# Patient Record
Sex: Female | Born: 1960 | Race: Black or African American | Hispanic: No | Marital: Single | Smoking: Former smoker
Health system: Southern US, Community
[De-identification: ages and names within clinical notes are randomized; demographics above are authoritative.]

## PROBLEM LIST (undated history)

## (undated) VITALS — BP 129/66 | HR 58 | Temp 98.0°F | Resp 16 | Ht 62.0 in | Wt 158.0 lb

## (undated) DIAGNOSIS — E119 Type 2 diabetes mellitus without complications: Secondary | ICD-10-CM

## (undated) DIAGNOSIS — F419 Anxiety disorder, unspecified: Secondary | ICD-10-CM

## (undated) DIAGNOSIS — E785 Hyperlipidemia, unspecified: Secondary | ICD-10-CM

## (undated) DIAGNOSIS — I10 Essential (primary) hypertension: Secondary | ICD-10-CM

## (undated) DIAGNOSIS — F329 Major depressive disorder, single episode, unspecified: Secondary | ICD-10-CM

## (undated) DIAGNOSIS — F32A Depression, unspecified: Secondary | ICD-10-CM

## (undated) DIAGNOSIS — K8020 Calculus of gallbladder without cholecystitis without obstruction: Secondary | ICD-10-CM

## (undated) DIAGNOSIS — F209 Schizophrenia, unspecified: Secondary | ICD-10-CM

## (undated) DIAGNOSIS — G629 Polyneuropathy, unspecified: Secondary | ICD-10-CM

## (undated) DIAGNOSIS — F319 Bipolar disorder, unspecified: Secondary | ICD-10-CM

## (undated) DIAGNOSIS — R569 Unspecified convulsions: Secondary | ICD-10-CM

## (undated) HISTORY — PX: TUBAL LIGATION: SHX77

## (undated) HISTORY — PX: TONSILLECTOMY AND ADENOIDECTOMY: SUR1326

---

## 1997-06-24 ENCOUNTER — Emergency Department (HOSPITAL_COMMUNITY): Admission: EM | Admit: 1997-06-24 | Discharge: 1997-06-24 | Payer: Self-pay | Admitting: Emergency Medicine

## 1997-07-09 ENCOUNTER — Inpatient Hospital Stay (HOSPITAL_COMMUNITY): Admission: AD | Admit: 1997-07-09 | Discharge: 1997-07-09 | Payer: Self-pay

## 1997-07-11 ENCOUNTER — Ambulatory Visit (HOSPITAL_COMMUNITY): Admission: RE | Admit: 1997-07-11 | Discharge: 1997-07-11 | Payer: Self-pay | Admitting: *Deleted

## 1997-07-12 ENCOUNTER — Ambulatory Visit (HOSPITAL_COMMUNITY): Admission: RE | Admit: 1997-07-12 | Discharge: 1997-07-12 | Payer: Self-pay | Admitting: Obstetrics & Gynecology

## 1997-07-19 ENCOUNTER — Ambulatory Visit (HOSPITAL_COMMUNITY): Admission: AD | Admit: 1997-07-19 | Discharge: 1997-07-19 | Payer: Self-pay | Admitting: *Deleted

## 1997-10-26 ENCOUNTER — Ambulatory Visit (HOSPITAL_COMMUNITY): Admission: RE | Admit: 1997-10-26 | Discharge: 1997-10-26 | Payer: Self-pay | Admitting: Obstetrics

## 1997-12-23 ENCOUNTER — Ambulatory Visit (HOSPITAL_COMMUNITY): Admission: RE | Admit: 1997-12-23 | Discharge: 1997-12-23 | Payer: Self-pay | Admitting: Obstetrics

## 1998-04-10 ENCOUNTER — Ambulatory Visit (HOSPITAL_COMMUNITY): Admission: AD | Admit: 1998-04-10 | Discharge: 1998-04-10 | Payer: Self-pay | Admitting: Obstetrics

## 1998-05-18 ENCOUNTER — Inpatient Hospital Stay (HOSPITAL_COMMUNITY): Admission: AD | Admit: 1998-05-18 | Discharge: 1998-05-21 | Payer: Self-pay | Admitting: Obstetrics

## 1998-05-27 ENCOUNTER — Emergency Department (HOSPITAL_COMMUNITY): Admission: EM | Admit: 1998-05-27 | Discharge: 1998-05-27 | Payer: Self-pay | Admitting: Emergency Medicine

## 1998-10-06 ENCOUNTER — Encounter: Admission: RE | Admit: 1998-10-06 | Discharge: 1998-10-06 | Payer: Self-pay | Admitting: Internal Medicine

## 1998-10-25 ENCOUNTER — Encounter: Admission: RE | Admit: 1998-10-25 | Discharge: 1999-01-23 | Payer: Self-pay | Admitting: *Deleted

## 1998-12-03 ENCOUNTER — Inpatient Hospital Stay (HOSPITAL_COMMUNITY): Admission: AD | Admit: 1998-12-03 | Discharge: 1998-12-06 | Payer: Self-pay | Admitting: Obstetrics

## 1999-03-07 ENCOUNTER — Other Ambulatory Visit: Admission: RE | Admit: 1999-03-07 | Discharge: 1999-03-07 | Payer: Self-pay | Admitting: Obstetrics

## 1999-07-07 ENCOUNTER — Emergency Department (HOSPITAL_COMMUNITY): Admission: EM | Admit: 1999-07-07 | Discharge: 1999-07-07 | Payer: Self-pay | Admitting: Emergency Medicine

## 1999-08-14 ENCOUNTER — Other Ambulatory Visit: Admission: RE | Admit: 1999-08-14 | Discharge: 1999-08-14 | Payer: Self-pay | Admitting: Obstetrics

## 1999-09-18 ENCOUNTER — Inpatient Hospital Stay (HOSPITAL_COMMUNITY): Admission: AD | Admit: 1999-09-18 | Discharge: 1999-09-20 | Payer: Self-pay | Admitting: Obstetrics

## 1999-09-25 ENCOUNTER — Encounter: Admission: RE | Admit: 1999-09-25 | Discharge: 1999-12-24 | Payer: Self-pay | Admitting: Obstetrics

## 2000-07-27 ENCOUNTER — Emergency Department (HOSPITAL_COMMUNITY): Admission: EM | Admit: 2000-07-27 | Discharge: 2000-07-27 | Payer: Self-pay | Admitting: Emergency Medicine

## 2001-06-12 ENCOUNTER — Encounter: Admission: RE | Admit: 2001-06-12 | Discharge: 2001-09-10 | Payer: Self-pay | Admitting: Internal Medicine

## 2001-06-21 ENCOUNTER — Inpatient Hospital Stay (HOSPITAL_COMMUNITY): Admission: AD | Admit: 2001-06-21 | Discharge: 2001-06-24 | Payer: Self-pay | Admitting: *Deleted

## 2001-06-21 ENCOUNTER — Emergency Department (HOSPITAL_COMMUNITY): Admission: EM | Admit: 2001-06-21 | Discharge: 2001-06-21 | Payer: Self-pay

## 2001-09-04 ENCOUNTER — Encounter (HOSPITAL_COMMUNITY): Admission: RE | Admit: 2001-09-04 | Discharge: 2001-09-22 | Payer: Self-pay | Admitting: Obstetrics

## 2001-09-15 ENCOUNTER — Encounter: Payer: Self-pay | Admitting: Obstetrics

## 2001-09-22 ENCOUNTER — Encounter: Payer: Self-pay | Admitting: Obstetrics

## 2001-09-23 ENCOUNTER — Encounter (INDEPENDENT_AMBULATORY_CARE_PROVIDER_SITE_OTHER): Payer: Self-pay | Admitting: Specialist

## 2001-09-23 ENCOUNTER — Inpatient Hospital Stay (HOSPITAL_COMMUNITY): Admission: AD | Admit: 2001-09-23 | Discharge: 2001-09-26 | Payer: Self-pay | Admitting: Obstetrics

## 2001-09-27 ENCOUNTER — Inpatient Hospital Stay (HOSPITAL_COMMUNITY): Admission: AD | Admit: 2001-09-27 | Discharge: 2001-10-02 | Payer: Self-pay | Admitting: Obstetrics

## 2001-10-10 ENCOUNTER — Inpatient Hospital Stay (HOSPITAL_COMMUNITY): Admission: AD | Admit: 2001-10-10 | Discharge: 2001-10-10 | Payer: Self-pay | Admitting: Obstetrics

## 2001-10-11 ENCOUNTER — Inpatient Hospital Stay (HOSPITAL_COMMUNITY): Admission: AD | Admit: 2001-10-11 | Discharge: 2001-10-11 | Payer: Self-pay | Admitting: Obstetrics

## 2001-10-13 ENCOUNTER — Inpatient Hospital Stay (HOSPITAL_COMMUNITY): Admission: AD | Admit: 2001-10-13 | Discharge: 2001-10-13 | Payer: Self-pay | Admitting: *Deleted

## 2002-11-04 ENCOUNTER — Emergency Department (HOSPITAL_COMMUNITY): Admission: EM | Admit: 2002-11-04 | Discharge: 2002-11-05 | Payer: Self-pay | Admitting: Emergency Medicine

## 2002-11-05 ENCOUNTER — Encounter: Payer: Self-pay | Admitting: Emergency Medicine

## 2003-01-04 ENCOUNTER — Inpatient Hospital Stay (HOSPITAL_COMMUNITY): Admission: EM | Admit: 2003-01-04 | Discharge: 2003-01-10 | Payer: Self-pay | Admitting: Psychiatry

## 2004-02-27 ENCOUNTER — Observation Stay (HOSPITAL_COMMUNITY): Admission: EM | Admit: 2004-02-27 | Discharge: 2004-02-28 | Payer: Self-pay | Admitting: Emergency Medicine

## 2004-02-28 ENCOUNTER — Inpatient Hospital Stay (HOSPITAL_COMMUNITY): Admission: RE | Admit: 2004-02-28 | Discharge: 2004-03-05 | Payer: Self-pay | Admitting: Psychiatry

## 2004-02-28 ENCOUNTER — Ambulatory Visit: Payer: Self-pay | Admitting: Psychiatry

## 2005-07-19 ENCOUNTER — Emergency Department (HOSPITAL_COMMUNITY): Admission: EM | Admit: 2005-07-19 | Discharge: 2005-07-20 | Payer: Self-pay | Admitting: Emergency Medicine

## 2005-12-27 ENCOUNTER — Emergency Department (HOSPITAL_COMMUNITY): Admission: EM | Admit: 2005-12-27 | Discharge: 2005-12-27 | Payer: Self-pay | Admitting: Emergency Medicine

## 2006-01-27 ENCOUNTER — Emergency Department (HOSPITAL_COMMUNITY): Admission: EM | Admit: 2006-01-27 | Discharge: 2006-01-27 | Payer: Self-pay | Admitting: Emergency Medicine

## 2006-04-24 ENCOUNTER — Ambulatory Visit: Payer: Self-pay | Admitting: Internal Medicine

## 2006-04-25 ENCOUNTER — Ambulatory Visit: Payer: Self-pay | Admitting: Internal Medicine

## 2006-04-25 ENCOUNTER — Ambulatory Visit: Payer: Self-pay | Admitting: *Deleted

## 2006-05-08 ENCOUNTER — Ambulatory Visit: Payer: Self-pay | Admitting: Internal Medicine

## 2006-12-22 ENCOUNTER — Telehealth (INDEPENDENT_AMBULATORY_CARE_PROVIDER_SITE_OTHER): Payer: Self-pay | Admitting: Internal Medicine

## 2007-01-01 ENCOUNTER — Ambulatory Visit: Payer: Self-pay | Admitting: Internal Medicine

## 2007-01-01 DIAGNOSIS — F410 Panic disorder [episodic paroxysmal anxiety] without agoraphobia: Secondary | ICD-10-CM | POA: Insufficient documentation

## 2007-01-01 DIAGNOSIS — E7800 Pure hypercholesterolemia, unspecified: Secondary | ICD-10-CM | POA: Insufficient documentation

## 2007-01-01 DIAGNOSIS — F429 Obsessive-compulsive disorder, unspecified: Secondary | ICD-10-CM | POA: Insufficient documentation

## 2007-01-01 LAB — CONVERTED CEMR LAB
Blood Glucose, Fingerstick: 179
Hgb A1c MFr Bld: 11.1 %

## 2007-01-13 ENCOUNTER — Ambulatory Visit: Payer: Self-pay | Admitting: Internal Medicine

## 2007-01-22 ENCOUNTER — Emergency Department (HOSPITAL_COMMUNITY): Admission: EM | Admit: 2007-01-22 | Discharge: 2007-01-22 | Payer: Self-pay | Admitting: Emergency Medicine

## 2007-02-26 ENCOUNTER — Ambulatory Visit: Payer: Self-pay | Admitting: Internal Medicine

## 2007-02-27 ENCOUNTER — Encounter (INDEPENDENT_AMBULATORY_CARE_PROVIDER_SITE_OTHER): Payer: Self-pay | Admitting: Internal Medicine

## 2007-03-01 ENCOUNTER — Encounter (INDEPENDENT_AMBULATORY_CARE_PROVIDER_SITE_OTHER): Payer: Self-pay | Admitting: Internal Medicine

## 2007-03-01 LAB — CONVERTED CEMR LAB
Cholesterol: 132 mg/dL (ref 0–200)
HDL: 48 mg/dL (ref >39)
LDL Cholesterol: 73 mg/dL (ref 0–99)
Total CHOL/HDL Ratio: 2.8
Triglycerides: 53 mg/dL (ref <150)
VLDL: 11 mg/dL (ref 0–40)

## 2007-05-21 ENCOUNTER — Encounter (INDEPENDENT_AMBULATORY_CARE_PROVIDER_SITE_OTHER): Payer: Self-pay | Admitting: Internal Medicine

## 2007-07-09 ENCOUNTER — Ambulatory Visit: Payer: Self-pay | Admitting: Internal Medicine

## 2007-07-09 DIAGNOSIS — A5901 Trichomonal vulvovaginitis: Secondary | ICD-10-CM | POA: Insufficient documentation

## 2007-07-09 LAB — CONVERTED CEMR LAB
Blood in Urine, dipstick: NEGATIVE
Glucose, Urine, Semiquant: NEGATIVE
Hgb A1c MFr Bld: 7.1 %
KOH Prep: NEGATIVE
Ketones, urine, test strip: NEGATIVE
Microalb, Ur: 0.2 mg/dL (ref 0.00–1.89)
Whiff Test: POSITIVE

## 2007-07-10 ENCOUNTER — Encounter (INDEPENDENT_AMBULATORY_CARE_PROVIDER_SITE_OTHER): Payer: Self-pay | Admitting: Nurse Practitioner

## 2007-07-30 ENCOUNTER — Emergency Department (HOSPITAL_COMMUNITY): Admission: EM | Admit: 2007-07-30 | Discharge: 2007-07-30 | Payer: Self-pay | Admitting: Emergency Medicine

## 2007-11-10 ENCOUNTER — Telehealth (INDEPENDENT_AMBULATORY_CARE_PROVIDER_SITE_OTHER): Payer: Self-pay | Admitting: Internal Medicine

## 2007-11-13 ENCOUNTER — Ambulatory Visit: Payer: Self-pay | Admitting: Internal Medicine

## 2007-11-13 DIAGNOSIS — J019 Acute sinusitis, unspecified: Secondary | ICD-10-CM | POA: Insufficient documentation

## 2007-11-13 LAB — CONVERTED CEMR LAB: Blood Glucose, Fingerstick: 100

## 2008-08-19 ENCOUNTER — Telehealth (INDEPENDENT_AMBULATORY_CARE_PROVIDER_SITE_OTHER): Payer: Self-pay | Admitting: Internal Medicine

## 2008-09-27 ENCOUNTER — Emergency Department (HOSPITAL_COMMUNITY): Admission: EM | Admit: 2008-09-27 | Discharge: 2008-09-27 | Payer: Self-pay | Admitting: Emergency Medicine

## 2008-10-12 ENCOUNTER — Ambulatory Visit: Payer: Self-pay | Admitting: Internal Medicine

## 2008-10-12 DIAGNOSIS — I10 Essential (primary) hypertension: Secondary | ICD-10-CM | POA: Insufficient documentation

## 2008-10-12 LAB — CONVERTED CEMR LAB
Blood Glucose, Fingerstick: 81
CO2: 24 meq/L (ref 19–32)
Calcium: 9.3 mg/dL (ref 8.4–10.5)
Creatinine, Ser: 0.85 mg/dL (ref 0.40–1.20)
Nitrite: NEGATIVE
Specific Gravity, Urine: 1.01
Urobilinogen, UA: 0.2
WBC Urine, dipstick: NEGATIVE
pH: 6

## 2008-10-25 ENCOUNTER — Telehealth (INDEPENDENT_AMBULATORY_CARE_PROVIDER_SITE_OTHER): Payer: Self-pay | Admitting: *Deleted

## 2008-10-28 ENCOUNTER — Ambulatory Visit: Payer: Self-pay | Admitting: Internal Medicine

## 2008-12-02 ENCOUNTER — Ambulatory Visit: Payer: Self-pay | Admitting: Internal Medicine

## 2008-12-06 ENCOUNTER — Emergency Department (HOSPITAL_COMMUNITY): Admission: EM | Admit: 2008-12-06 | Discharge: 2008-12-06 | Payer: Self-pay | Admitting: Family Medicine

## 2008-12-21 ENCOUNTER — Emergency Department (HOSPITAL_COMMUNITY): Admission: EM | Admit: 2008-12-21 | Discharge: 2008-12-21 | Payer: Self-pay | Admitting: Emergency Medicine

## 2008-12-22 ENCOUNTER — Inpatient Hospital Stay (HOSPITAL_COMMUNITY): Admission: AD | Admit: 2008-12-22 | Discharge: 2008-12-26 | Payer: Self-pay | Admitting: Psychiatry

## 2008-12-22 ENCOUNTER — Ambulatory Visit: Payer: Self-pay | Admitting: Psychiatry

## 2008-12-29 ENCOUNTER — Ambulatory Visit: Payer: Self-pay | Admitting: Internal Medicine

## 2008-12-29 DIAGNOSIS — F200 Paranoid schizophrenia: Secondary | ICD-10-CM | POA: Insufficient documentation

## 2008-12-29 LAB — CONVERTED CEMR LAB: Blood Glucose, Fingerstick: 82

## 2009-01-23 DIAGNOSIS — D649 Anemia, unspecified: Secondary | ICD-10-CM | POA: Insufficient documentation

## 2009-01-23 LAB — CONVERTED CEMR LAB
AST: 11 units/L (ref 0–37)
Alkaline Phosphatase: 72 units/L (ref 39–117)
BUN: 10 mg/dL (ref 6–23)
Basophils Absolute: 0 10*3/uL (ref 0.0–0.1)
Creatinine, Ser: 0.81 mg/dL (ref 0.40–1.20)
Eosinophils Absolute: 0.2 10*3/uL (ref 0.0–0.7)
Eosinophils Relative: 2 % (ref 0–5)
HCT: 37 % (ref 36.0–46.0)
HDL: 49 mg/dL (ref >39)
LDL Cholesterol: 63 mg/dL (ref 0–99)
Lymphocytes Relative: 24 % (ref 12–46)
Platelets: 311 10*3/uL (ref 150–400)
Potassium: 4.5 meq/L (ref 3.5–5.3)
RDW: 14.5 % (ref 11.5–15.5)
Total Bilirubin: 0.7 mg/dL (ref 0.3–1.2)
Total CHOL/HDL Ratio: 2.4
VLDL: 8 mg/dL (ref 0–40)

## 2009-03-02 ENCOUNTER — Ambulatory Visit: Payer: Self-pay | Admitting: Internal Medicine

## 2009-03-02 LAB — CONVERTED CEMR LAB
Platelets: 253 10*3/uL (ref 150–400)
Retic Ct Pct: 1.1 % (ref 0.4–3.1)
WBC: 6.9 10*3/uL (ref 4.0–10.5)

## 2009-03-16 ENCOUNTER — Encounter (INDEPENDENT_AMBULATORY_CARE_PROVIDER_SITE_OTHER): Payer: Self-pay | Admitting: Internal Medicine

## 2009-07-03 ENCOUNTER — Emergency Department (HOSPITAL_COMMUNITY): Admission: EM | Admit: 2009-07-03 | Discharge: 2009-07-04 | Payer: Self-pay | Admitting: Emergency Medicine

## 2009-07-04 ENCOUNTER — Ambulatory Visit: Payer: Self-pay | Admitting: Psychiatry

## 2009-07-04 ENCOUNTER — Inpatient Hospital Stay (HOSPITAL_COMMUNITY): Admission: AD | Admit: 2009-07-04 | Discharge: 2009-07-06 | Payer: Self-pay | Admitting: Psychiatry

## 2009-07-20 ENCOUNTER — Ambulatory Visit: Payer: Self-pay | Admitting: Internal Medicine

## 2009-08-21 ENCOUNTER — Ambulatory Visit: Payer: Self-pay | Admitting: Psychiatry

## 2009-08-21 ENCOUNTER — Inpatient Hospital Stay (HOSPITAL_COMMUNITY): Admission: AD | Admit: 2009-08-21 | Discharge: 2009-08-24 | Payer: Self-pay | Admitting: Psychiatry

## 2009-08-21 ENCOUNTER — Emergency Department (HOSPITAL_COMMUNITY): Admission: EM | Admit: 2009-08-21 | Discharge: 2009-08-21 | Payer: Self-pay | Admitting: Emergency Medicine

## 2010-03-13 NOTE — Letter (Signed)
Summary: *HSN Results Follow up  [place], [place] [address]   Phone: [phone]  Fax: [phone]      03/16/2009   Lorraine Turner [address] [place],   [address]   Dear  [name],                            ____S.Drinkard,FNP   ____D. Gore,FNP       ____B. McPherson,MD   ____V. Rankins,MD    __X__E. Shawntrice Salle,MD    ____N. Hassell Done, FNP  ____D. Jobe Igo, MD    ____K. Tomma Lightning, MD    ____Other     This letter is to inform you that your recent test(s):  _______Pap Smear    ____X___Lab Test     _______X-ray    ____X___ is within acceptable limits  _______ requires a medication change  _______ requires a follow-up lab visit  _______ requires a follow-up visit with your provider   Comments:  I'm not sure if you changed your diet or what is different, but you are no longer anemic.  Your B12 and folate levels were fine as well.       _________________________________________________________ If you have any questions, please contact our office                     Sincerely,  Mack Hook MD [facility]

## 2010-03-13 NOTE — Assessment & Plan Note (Signed)
Summary: renew meds////kt   Vital Signs:  Patient profile:   50 year old female Weight:      169 pounds BMI:     30.05 Temp:     96.3 degrees F Pulse rate:   64 / minute Pulse rhythm:   regular Resp:     20 per minute BP sitting:   129 / 79  (left arm) Cuff size:   regular  Vitals Entered By: Shellia Carwin CMA (July 20, 2009 12:35 PM) CC: Needs med refills.  Out of all meds x 2 days. Is Patient Diabetic? Yes Pain Assessment Patient in pain? no      CBG Result 90  Does patient need assistance? Ambulation Normal   CC:  Needs med refills.  Out of all meds x 2 days.Marland Kitchen  History of Present Illness: 1.  Hx of mild anemia:  pt. states she changed her diet--not clear exactly what changes were made, but when finally followed up, her hgb was quite good at beginning of year.  2.  DM:  Sugars running 90-165 , the latter after eating.  Has had a couple of readings into 70s.  Checking feet before bed.  Has not had an eye check in some time.  3.  Psych:  Hospitalized for 2-3 days.  Pt. was homicidal toward her husband, who she describes as continuing to cheat on her.  States she is financially dependent on her husband, but does not want to be with him any longer and this is causing her significant difficulties.  Pt. then admits to not staying on her meds.  Starts doing well and then stops the meds and at some point, just nose dives with her psych health.  Currently taking Trazodone and Risperdal.  States has been taking both since hospital discharge.  4.  HYpertension:  has been controlled.  5.  Hypercholesterolemia:  controlled  with last check  Allergies (verified): No Known Drug Allergies  Past History:  Past Surgical History: None  Physical Exam  General:  Calm, NAd Lungs:  Normal respiratory effort, chest expands symmetrically. Lungs are clear to auscultation, no crackles or wheezes. Heart:  Normal rate and regular rhythm. S1 and S2 normal without gallop, murmur, click, rub  or other extra sounds.  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 to stay on meds. Send to Birdie Hopes to emphasize this and to help her make a decision regarding her marital situation Orders: Psychology Referral (Psychology)  Problem # 2:  UNSPECIFIED ANEMIA (ICD-285.9) Resolved with dietary changes  Problem # 3:  HYPERTENSION (ICD-401.9) Controlled Her updated medication list for this problem includes:    Lisinopril 5 Mg Tabs (Lisinopril) .Marland Kitchen... 1 tab by mouth daily  Problem # 4:  HYPERCHOLESTEROLEMIA (ICD-272.0) Controlled Her updated medication list for this problem includes:    Lipitor 20 Mg Tabs (Atorvastatin calcium) .Marland Kitchen... 1 tab by mouth daily **needs cholesterol checked before more refills**  Problem # 5:  DIABETES MELLITUS, TYPE II, UNCONTROLLED (ICD-250.02) Controlled at 6.9% today. Her updated medication list for this problem includes:    Glucovance 5-500 Mg Tabs (Glyburide-metformin) .Marland Kitchen... Take 2 tablets by mouth every 12 hours for diabetes. must see dr.Jaleen Finch    Actos 30 Mg Tabs (Pioglitazone hcl) .Marland Kitchen... 1 tab by mouth daily    Lisinopril 5 Mg Tabs (Lisinopril) .Marland Kitchen... 1 tab by mouth daily  Complete Medication List: 1)  Glucovance 5-500 Mg Tabs (Glyburide-metformin) .Marland KitchenMarland KitchenMarland Kitchen  Take 2 tablets by mouth every 12 hours for diabetes. must see dr.Airabella Barley 2)  Invega 6 Mg Tb24 (Paliperidone) .... 2 tabs by mouth daily 3)  Actos 30 Mg Tabs (Pioglitazone hcl) .Marland Kitchen.. 1 tab by mouth daily 4)  Lipitor 20 Mg Tabs (Atorvastatin calcium) .Marland Kitchen.. 1 tab by mouth daily **needs cholesterol checked before more refills** 5)  Zithromax Z-pak 250 Mg Tabs (Azithromycin) .... 2 tabs by mouth today, then 1 tab by mouth daily for 4 more days. 6)  Glucometer Elite Test Strp (Glucose blood) .... Once daily sugar testing 7)  Lisinopril 5 Mg Tabs (Lisinopril) .Marland Kitchen.. 1 tab by mouth daily 8)  Trazodone Hcl 100 Mg Tabs  (Trazodone hcl) .... Unknown dosing--[facility]-dr. taylor 9)  Risperdal 1 Mg Tabs (Risperidone) .... Unknown dosing--[facility]--dr. Lovena Le.  Other Orders: Capillary Blood Glucose/CBG [id number])  Patient Instructions: 1)  Schedule [place] 2)  Follow up with Dr. Amil Amen in 4 months for CPP 3)  Referral to Birdie Hopes Prescriptions: LISINOPRIL 5 MG TABS (LISINOPRIL) 1 tab by mouth daily  #30 x 11   Entered and Authorized by:   Mack Hook MD   Signed by:   Mack Hook MD on 07/20/2009   Method used:   Faxed to ...       [facility] (retail)       [place], [place]  [address]       Ph: [phone]       Fax: [phone]   RxID:   [id number] LIPITOR 20 MG  TABS (ATORVASTATIN CALCIUM) 1 tab by mouth daily **needs cholesterol checked before more refills**  #30 x 11   Entered and Authorized by:   Mack Hook MD   Signed by:   Mack Hook MD on 07/20/2009   Method used:   Faxed to ...       [facility] (retail)       [place], [place]  [address]       Ph: [phone] [place]       Fax: [phone]   RxID:   [id number] ACTOS 30 MG  TABS (PIOGLITAZONE HCL) 1 tab by mouth daily  #30 x 11   Entered and Authorized by:   Mack Hook MD   Signed by:   Mack Hook MD on 07/20/2009   Method used:   Faxed to ...       [facility] (retail)       [place], [place]  [address]       Ph: [phone] [place]       Fax: [phone]   RxID:   [id number] GLUCOVANCE 5-500 MG  TABS (GLYBURIDE-METFORMIN) Take 2 tablets by mouth every 12 hours For diabetes. MUst SEE Dr.Naythen Heikkila  #120 x 11   Entered and Authorized by:   Mack Hook MD   Signed by:   Mack Hook MD on 07/20/2009   Method used:   Faxed to ...       [facility]  (retail)       [place],   [address]       Ph: [phone] [place]       Fax: [phone]   RxID:   [id number]

## 2010-04-29 LAB — GLUCOSE, CAPILLARY
Glucose-Capillary: 122 mg/dL — ABNORMAL HIGH (ref 70–99)
Glucose-Capillary: 126 mg/dL — ABNORMAL HIGH (ref 70–99)
Glucose-Capillary: 132 mg/dL — ABNORMAL HIGH (ref 70–99)
Glucose-Capillary: 134 mg/dL — ABNORMAL HIGH (ref 70–99)
Glucose-Capillary: 151 mg/dL — ABNORMAL HIGH (ref 70–99)
Glucose-Capillary: 163 mg/dL — ABNORMAL HIGH (ref 70–99)
Glucose-Capillary: 172 mg/dL — ABNORMAL HIGH (ref 70–99)
Glucose-Capillary: 56 mg/dL — ABNORMAL LOW (ref 70–99)

## 2010-04-29 LAB — URINALYSIS, ROUTINE W REFLEX MICROSCOPIC
Bilirubin Urine: NEGATIVE
Glucose, UA: NEGATIVE mg/dL
Ketones, ur: NEGATIVE mg/dL
Nitrite: NEGATIVE
Protein, ur: NEGATIVE mg/dL
pH: 6 (ref 5.0–8.0)

## 2010-04-29 LAB — BASIC METABOLIC PANEL
BUN: 12 mg/dL (ref 6–23)
Calcium: 9.6 mg/dL (ref 8.4–10.5)
Creatinine, Ser: 1.27 mg/dL — ABNORMAL HIGH (ref 0.4–1.2)
GFR calc non Af Amer: 45 mL/min — ABNORMAL LOW (ref >60)
Glucose, Bld: 254 mg/dL — ABNORMAL HIGH (ref 70–99)
Potassium: 3.5 mEq/L (ref 3.5–5.1)

## 2010-04-29 LAB — RAPID URINE DRUG SCREEN, HOSP PERFORMED
Benzodiazepines: NOT DETECTED
Cocaine: NOT DETECTED
Tetrahydrocannabinol: NOT DETECTED

## 2010-04-29 LAB — CBC
HCT: 37.5 % (ref 36.0–46.0)
MCHC: 34.3 g/dL (ref 30.0–36.0)
Platelets: 273 10*3/uL (ref 150–400)
RDW: 13.8 % (ref 11.5–15.5)
WBC: 7.7 10*3/uL (ref 4.0–10.5)

## 2010-04-29 LAB — DIFFERENTIAL
Basophils Absolute: 0 10*3/uL (ref 0.0–0.1)
Eosinophils Absolute: 0 10*3/uL (ref 0.0–0.7)
Eosinophils Relative: 0 % (ref 0–5)
Monocytes Absolute: 0.6 10*3/uL (ref 0.1–1.0)

## 2010-04-29 LAB — CK: Total CK: 127 U/L (ref 7–177)

## 2010-04-29 LAB — ETHANOL: Alcohol, Ethyl (B): <5 mg/dL (ref 0–10)

## 2010-04-29 LAB — POCT PREGNANCY, URINE: Preg Test, Ur: NEGATIVE

## 2010-04-30 LAB — GLUCOSE, CAPILLARY
Glucose-Capillary: 111 mg/dL — ABNORMAL HIGH (ref 70–99)
Glucose-Capillary: 119 mg/dL — ABNORMAL HIGH (ref 70–99)
Glucose-Capillary: 130 mg/dL — ABNORMAL HIGH (ref 70–99)
Glucose-Capillary: 141 mg/dL — ABNORMAL HIGH (ref 70–99)

## 2010-04-30 LAB — BASIC METABOLIC PANEL
Calcium: 9.2 mg/dL (ref 8.4–10.5)
Creatinine, Ser: 1 mg/dL (ref 0.4–1.2)
GFR calc Af Amer: >60 mL/min (ref >60)
GFR calc non Af Amer: 59 mL/min — ABNORMAL LOW (ref >60)
Sodium: 137 mEq/L (ref 135–145)

## 2010-04-30 LAB — DIFFERENTIAL
Basophils Relative: 0 % (ref 0–1)
Lymphocytes Relative: 18 % (ref 12–46)
Lymphs Abs: 1.5 10*3/uL (ref 0.7–4.0)
Monocytes Relative: 9 % (ref 3–12)
Neutro Abs: 5.7 10*3/uL (ref 1.7–7.7)
Neutrophils Relative %: 72 % (ref 43–77)

## 2010-04-30 LAB — URINALYSIS, ROUTINE W REFLEX MICROSCOPIC
Glucose, UA: NEGATIVE mg/dL
Leukocytes, UA: NEGATIVE
Protein, ur: 30 mg/dL — AB
Specific Gravity, Urine: 1.024 (ref 1.005–1.030)

## 2010-04-30 LAB — RAPID URINE DRUG SCREEN, HOSP PERFORMED
Benzodiazepines: NOT DETECTED
Cocaine: NOT DETECTED
Tetrahydrocannabinol: NOT DETECTED

## 2010-04-30 LAB — CBC
Hemoglobin: 12.2 g/dL (ref 12.0–15.0)
MCHC: 33.7 g/dL (ref 30.0–36.0)
RBC: 3.88 MIL/uL (ref 3.87–5.11)
WBC: 8 10*3/uL (ref 4.0–10.5)

## 2010-04-30 LAB — ETHANOL: Alcohol, Ethyl (B): <5 mg/dL (ref 0–10)

## 2010-04-30 LAB — URINE MICROSCOPIC-ADD ON

## 2010-05-16 LAB — CBC
Hemoglobin: 13.2 g/dL (ref 12.0–15.0)
MCHC: 33.9 g/dL (ref 30.0–36.0)
MCV: 93 fL (ref 78.0–100.0)
RBC: 4.18 MIL/uL (ref 3.87–5.11)
WBC: 11.8 10*3/uL — ABNORMAL HIGH (ref 4.0–10.5)

## 2010-05-16 LAB — HEPATIC FUNCTION PANEL
ALT: 15 U/L (ref 0–35)
AST: 16 U/L (ref 0–37)
Albumin: 3.9 g/dL (ref 3.5–5.2)
Alkaline Phosphatase: 74 U/L (ref 39–117)
Bilirubin, Direct: 0.1 mg/dL (ref 0.0–0.3)
Total Bilirubin: 0.6 mg/dL (ref 0.3–1.2)

## 2010-05-16 LAB — BASIC METABOLIC PANEL
CO2: 26 mEq/L (ref 19–32)
Calcium: 9.9 mg/dL (ref 8.4–10.5)
Chloride: 105 mEq/L (ref 96–112)
GFR calc Af Amer: >60 mL/min (ref >60)
Sodium: 140 mEq/L (ref 135–145)

## 2010-05-16 LAB — GLUCOSE, CAPILLARY
Glucose-Capillary: 115 mg/dL — ABNORMAL HIGH (ref 70–99)
Glucose-Capillary: 194 mg/dL — ABNORMAL HIGH (ref 70–99)
Glucose-Capillary: 194 mg/dL — ABNORMAL HIGH (ref 70–99)
Glucose-Capillary: 93 mg/dL (ref 70–99)

## 2010-05-16 LAB — RAPID URINE DRUG SCREEN, HOSP PERFORMED
Cocaine: NOT DETECTED
Opiates: NOT DETECTED

## 2010-05-16 LAB — DIFFERENTIAL
Basophils Relative: 0 % (ref 0–1)
Eosinophils Absolute: 0 10*3/uL (ref 0.0–0.7)
Eosinophils Relative: 0 % (ref 0–5)
Lymphocytes Relative: 12 % (ref 12–46)
Neutro Abs: 9.9 10*3/uL — ABNORMAL HIGH (ref 1.7–7.7)

## 2010-05-27 ENCOUNTER — Emergency Department (HOSPITAL_COMMUNITY)
Admission: EM | Admit: 2010-05-27 | Discharge: 2010-05-27 | Disposition: A | Discharge status: Home / Self Care | Payer: Self-pay | Attending: Emergency Medicine | Admitting: Emergency Medicine

## 2010-05-27 DIAGNOSIS — F411 Generalized anxiety disorder: Secondary | ICD-10-CM | POA: Insufficient documentation

## 2010-05-27 DIAGNOSIS — E119 Type 2 diabetes mellitus without complications: Secondary | ICD-10-CM | POA: Insufficient documentation

## 2010-05-27 LAB — RAPID URINE DRUG SCREEN, HOSP PERFORMED
Cocaine: NOT DETECTED
Tetrahydrocannabinol: NOT DETECTED

## 2010-05-27 LAB — DIFFERENTIAL
Eosinophils Relative: 2 % (ref 0–5)
Lymphocytes Relative: 30 % (ref 12–46)
Lymphs Abs: 1.3 10*3/uL (ref 0.7–4.0)
Monocytes Absolute: 0.6 10*3/uL (ref 0.1–1.0)
Monocytes Relative: 14 % — ABNORMAL HIGH (ref 3–12)

## 2010-05-27 LAB — BASIC METABOLIC PANEL
CO2: 24 mEq/L (ref 19–32)
Calcium: 9.2 mg/dL (ref 8.4–10.5)
Glucose, Bld: 154 mg/dL — ABNORMAL HIGH (ref 70–99)
Sodium: 138 mEq/L (ref 135–145)

## 2010-05-27 LAB — CBC
HCT: 36.6 % (ref 36.0–46.0)
MCHC: 33.3 g/dL (ref 30.0–36.0)
MCV: 88.8 fL (ref 78.0–100.0)
RDW: 13.8 % (ref 11.5–15.5)
WBC: 4.4 10*3/uL (ref 4.0–10.5)

## 2010-06-29 NOTE — Op Note (Signed)
   NAME:  RYLA, STOVALL NO.:  [id number]   MEDICAL RECORD NO.:  [id number]                   PATIENT TYPE:  INP   LOCATION:  9143                                 FACILITY:  [facility]   PHYSICIAN:  Frederico Hamman, M.D.           DATE OF BIRTH:  Oct 22, 1960   DATE OF PROCEDURE:  09/25/2001  DATE OF DISCHARGE:                                 OPERATIVE REPORT   PREOPERATIVE DIAGNOSES:  1. Multiparity procedure.  2. Postpartum tubal ligation.   DESCRIPTION OF PROCEDURE:  Using epidural, the patient in the supine  position, abdomen prepped and draped.  The bladder emptied with straight  catheter.  A midline subumbilical incision 1 inch long was made and carried  down to the fascia.  The fascia was cleaned, grasped with two Kochers, and  the fascia and the peritoneum were opened with the Mayo scissors.  It was  noted there was about 50-75 cc of old dark blood in the peritoneal cavity  which was aspirated.  The left tube was grasped in the mid portion with a  Babcock clamp and the tube traced to the fimbriae.  Sutures of 0 plain were  placed in the mesosalpinx below the portion of tube that had been clamped.  This was tied and approximately one inch of tube transected.  Hemostasis  satisfactory.  The procedure done in exact fashion on the other side.  The  abdomen closed in layers.  The fascia and peritoneal contours with 0 Dexon  and the skin closed with subcuticular suture of 0 plain.  The patient  tolerated the procedure well, taken to the recovery room in good condition.                                                Frederico Hamman, M.D.    BAM/MEDQ  D:  09/25/2001  T:  09/25/2001  Job:  [id number]

## 2010-06-29 NOTE — H&P (Signed)
[facility]  Patient:    Lorraine Turner, Lorraine Turner Visit Number: [id number] MRN: [id number]          Service Type: PSY Location: [facility] Attending Physician:  Stark Jock Dictated by:   Kerrie Buffalo Scott, N.P. Admit Date:  06/21/2001                     Psychiatric Admission Assessment  DATE OF ADMISSION:  Jun 21, 2001.  IDENTIFYING INFORMATION:  This is a 50 year old African-American female who is an involuntary admission.  HISTORY OF THE PRESENT ILLNESS:  This patient who is 6 months pregnant with a 10-year history of mental illness stopped her medications when she became pregnant according to the commitment; however, she states today that she stopped her medicines because she thought she did not need them at the time. She states she continued to take her Zoloft every other day or so, but stopped her Risperdal months ago.  She states she became angry with her husband and reported voices telling her that her husband would leave her.  She thinks that someone named Golda Acre is telling her things.  This according to the involuntary petition.  Today, she reports that the sheriff told her she was wandering too close to the road yesterday and he was afraid that she would harm herself. She said that she was out walking because she did not know where to go, she just wanted to run away, that she had so many thoughts in her head she could not think straight.  She had attempted to clean rooms.  She feels compulsions to clean but she got her thoughts mixed and could not decide what she wanted to do, then became angry and agitated and began throwing clothes out of the house and threw a chair out of the house.  The patient endorses some suicidal ideation without a specific plan.  She denies any homicidal ideation.  She endorses auditory hallucinations although she is not having those immediately at this time.  She also endorses some feeling that she compulsively  needs to clean and cannot stand to have things on the floor and needs to have it picked up.  PAST PSYCHIATRIC HISTORY:  The patient has been followed at [facility] but admits that she has been noncompliant with appointments, says she has difficulty with transportation.  This is her fifth psychiatric admission, with prior admissions to [facility] [place] and [facility], with her last admission being [facility] in June of 2002, at which time she was placed on Zoloft and Risperdal and she thought she was doing well so therefore stopped her Risperdal.  This is her first admission to [facility].  The patient reports she has a history of prior suicide attempts, with thoughts in the past of attempting to jump off of bridges, and has attempted to cut herself in the past.  SOCIAL HISTORY:  The patient has been married for the past 6 years.  She has 4 children, aged 59, 16, 3 and 2.  She lives at home with her husband of 6 years. Her mother, who is chronically ill with kidney failure, and her 3 youngest children, a 50 year old son who is going to school, and a 38 year old and 50 year old at home.  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  Patients husband is employed and works at [employer].  FAMILY HISTORY:  The patient denies any family history of substance abuse or mental illness.  The patient also denies any past personal history of sexual or physical abuse.  ALCOHOL AND DRUG HISTORY:  The patient is a nonsmoker and denies any substance abuse.  PAST MEDICAL HISTORY:  The patient is followed by Dr. Gracy Racer, who is her OB/GYN, and also by Dr. Jeanann Lewandowsky, who is her diabetes doctor.  The patient is due for a followup up  Wednesday at Dr. Alton Revere office.  The patient is also attending classes at the [facility]. Medical problems are diabetes mellitus, diagnosed approximately 4 years ago, and the patient apparently has an early as noted by urinalysis and she reports a history of vaginal itching for approximately 1 week.  DRUG ALLERGIES:  None.  POSITIVE PHYSICAL FINDINGS:  The patient was medically cleared in the emergency room at [facility], at which time the fetal heart rate was 144 and the fetus was moving and doing well.  Vital signs on admission:  Temp 99.4, pulse 81, respirations 20, blood pressure 119/67.  The patients urine showed moderate leukocyte esterase.  Her hemoglobin is 11.4, hematocrit 32.5. Her WBC is 10.5.  Her CMET was within normal limits, with a creatinine of 0.6 and a BUN of 6.  Her glucose was 109.  Her fasting glucose this morning was 135.  MENTAL STATUS EXAMINATION:  This is a medium-build, fully alert female with adequate hygiene.  She is in no acute distress.  She has a constricted, anxious affect with episodes of tearfulness but is cooperative and polite for the exam.  Speech is normal in pace and tone.  Mood is anxious and depressed. Thought process is generally logical and coherent; however, the patient does have episodes of thought disorganization and displays some thought blocking and some slight response latency in her speech pattern.  She is positive for suicidal ideation without a specific plan at this time.  No evidence of homicidal ideation.  No evidence of auditory or visual hallucinations at this time.  Cognitively, she is intact and oriented x3.  ADMISSION DIAGNOSES: Axis I:    1. Major depression, recurrent, severe, with psychosis.            2. Obsessive-compulsive disorder. Axis II:   None. Axis III:  Yeast infection by history, pregnancy of [redacted] weeks gestation, and            urinary tract infection. Axis IV:   Moderate,  economic problems with no money to pay for medications            and difficulty with transportation to appointments. Axis V:    Current 28, past year 53.   INITIAL PLAN OF CARE:  Involuntarily admit the patient to evaluate to evaluate her agitation and possible self harm thoughts.  Our goal is to alleviate her suicidal ideation, agitation, and to improve her sleep and functioning at home with her activities of daily living, with thoughts of returning her to full functioning at home.  We are going to ask the case manager to assist with supportive care in terms of her children while she is hospitalized and we have spoken with her husband and are going to talk to his employer to make sure he feels free  to care for the children while she is here on the unit.  Meanwhile, we have also spoken to Dr. Ruthann Cancer who concurs on the patients needs for medications.  We are going to continue her Zoloft 50 mg daily and increase that to 75 mg tomorrow.  We will restart her Risperdal at 0.5 mg p.o. q.h.s. and also give her Ambien 10 mg at h.s. p.r.n. for sleep.  Meanwhile, we will also put her on Macrobid 2x a day for 7 days and Gynazole-1 cream x1.  These medications have been approved by Dr. Ruthann Cancer.  ESTIMATED LENGTH OF STAY:  2 to 3 days. Dictated by:   Kerrie Buffalo Scott, N.P. Attending Physician:  Stark Jock DD:  06/22/01 TD:  06/22/01 Job: [id number]

## 2010-06-29 NOTE — Op Note (Signed)
   NAME:  Lorraine Turner, MORRISON NO.:  [id number]   MEDICAL RECORD NO.:  [id number]                   PATIENT TYPE:  INP   LOCATION:  9143                                 FACILITY:  [facility]   PHYSICIAN:  Frederico Hamman, M.D.           DATE OF BIRTH:  1961-01-29   DATE OF PROCEDURE:  09/24/2001  DATE OF DISCHARGE:                                 OPERATIVE REPORT   PROCEDURE:  Vacuum extraction.   DESCRIPTION OF PROCEDURE:  The patient was fully dilated and pushed until  the vertex was at the +2 station.  The fetal heart rate was in the 80s, 80-  90, and oxygen was started.  The vacuum was applied at a +2 station and the  patient pushed at each contraction.  In between the contractions, the  pressure was released from the vacuum.  There was one pop-off during one  push.  She had a normal vaginal delivery from the LOA position at 1:33 p.m.  of a female, Apgars 4 and 8, cord pH 7.07.  There was no episiotomy or  laceration and the placenta was delivered spontaneously.                                               Frederico Hamman, M.D.    BAM/MEDQ  D:  09/25/2001  T:  09/25/2001  Job:  [id number]

## 2010-06-29 NOTE — Discharge Summary (Signed)
NAME:  Lorraine Turner, Lorraine Turner                         ACCOUNT NO.:  [id number]   MEDICAL RECORD NO.:  [id number]                   PATIENT TYPE:  INP   LOCATION:  9143                                 FACILITY:  [facility]   PHYSICIAN:  Charles A. Jodi Mourning, M.D.             DATE OF BIRTH:  07-29-1960   DATE OF ADMISSION:  09/23/2001  DATE OF DISCHARGE:  09/26/2001                                 DISCHARGE SUMMARY   ADMITTING DIAGNOSES:  1. Term pregnancy.  2. Active labor.  3. Previous cesarean section.  4. Desires permanent sterilization.   DISCHARGE DIAGNOSES:  Term pregnancy.  1. Active labor.  2. Previous cesarean section.  3. Desires permanent sterilization.  4. Status post normal spontaneous vaginal delivery viable female infant with     Apgars of 4 at one minute, 8 at five minutes.  Delivery was done on     September 24, 2001.  There were no complications.  5. Postpartum tubal ligation was done on postpartum day number one on September 25, 2001.  There were no intraoperative complications.  The patient had     an uncomplicated postpartum and postoperative course and was discharged     home in good condition with infant on postpartum day number two.   REASON FOR ADMISSION:  A 51 year old G6, P4, estimated date of confinement  of October 02, 2001 diabetic on Novolin 2 units in the a.m. and Lantus 25  units at night.  Had an amniocentesis on September 22, 2001 with positive PG.  Presents for induction of labor.  On examination cervix was 3 cm dilated,  90% effaced and the vertex was at a -3 station.   PAST SURGICAL HISTORY:  Cesarean section 2000, normal spontaneous vaginal  delivery 1981, 1994, 2001.   PAST MEDICAL HISTORY:  Diabetes.   MEDICATIONS:  Prenatal vitamins, Novolog p.r.n., Lantus.   ALLERGIES:  No known drug allergies.   PHYSICAL EXAMINATION:  GENERAL:  Well-nourished, well-developed female in no  acute distress.  VITAL SIGNS:  Temperature 97, pulse 62, respiratory rate 18,  blood pressure  110/60.  HEENT:  Normal.  LUNGS:  Clear.  CARDIOVASCULAR:  Regular rate and rhythm without murmurs, rubs, or gallops.  ABDOMEN:  Gravid, nontender.  PELVIC:  Cervix 3 cm dilated, vertex at a -1 station.   IMPRESSION:  A [redacted] week gestation, type 1 diabetic.  Amniocentesis done with  positive fetal lung maturity studies.   PLAN:  Admitted for induction of labor.   LABORATORY VALUES:  Hemoglobin 11.9, hematocrit 34.8, white blood cell count  7900, platelets 222,000.  RPR was nonreactive.   HOSPITAL COURSE:  The patient was admitted to the labor and delivery unit  and on examination the cervix was 3 cm dilated, 90% effaced, and the vertex  was at a -3 station.  Active rupture of membranes was done and fluid was  clear.  The patient was  having uterine contractions that were regular and  strong.  She requested epidural for pain and that was given.  Labor  progressed rapidly to full dilatation by midday and she progressed to vacuum  assisted vaginal delivery because of fetal bradycardia and ineffective  pushing efforts.  The patient had an uncomplicated vacuum assisted delivery  and the postpartum course was also uncomplicated.  She had initially  requested postpartum tubal ligation and postpartum tubal ligation was done  on postpartum day number one without complications.  The remainder of her  postpartum and postoperative course was uncomplicated and she was discharged  home on postpartum day number two in good condition.   DISDCHARGE LABORATORY VALUES:  Hemoglobin 11.3, hematocrit 32.9.   DISCHARGE DISPOSITION:  Continue medications.  Continue prenatal vitamins.  Tylox one to two tablets q.3-4h. as needed for pain.  Continue diabetes  medications and patient is to contact her primary care physician for further  management.  Routine written instructions were given for diet and activity  and routine postpartum and postoperative care.  The patient is to call Dr.   Marcheta Grammes office for a follow-up appointment.                                               Charles A. Jodi Mourning, M.D.    CAH/MEDQ  D:  10/30/2001  T:  11/02/2001  Job:  [id number]

## 2010-06-29 NOTE — Discharge Summary (Signed)
NAME:  Lorraine Turner, Lorraine Turner NO.:  [id number]   MEDICAL RECORD NO.:  [id number]          PATIENT TYPE:  IPS   LOCATION:  0301                          FACILITY:  [facility]   PHYSICIAN:  Rulon Eisenmenger, M.D. DATE OF BIRTH:  August 13, 1960   DATE OF ADMISSION:  02/28/2004  DATE OF DISCHARGE:  03/05/2004                                 DISCHARGE SUMMARY   IDENTIFYING DATA:  This is a  50 year old African-American female, married,  voluntarily admitted, presenting in the emergency room, taking an overdose  of 8 Ativan tablets, wanting to hurt herself.  Out of medications for 4  weeks, drinking 40 ounce beers, 6-8 on the weekend, episodes of rage,  homicidal ideation towards husband over conflict about daughter and positive  paranoid ideation.  Past psychiatric history:  Second hospitalization  [facility], last in November of 2004, history of mood swings,  paranoid ideation and suicidal and homicidal ideation   ADMISSION MEDICATIONS:  Zoloft and Ativan which she had not been taking for  over a month.   ALLERGIES:  No known drug allergies.   PHYSICAL EXAMINATION:  Within normal limits, neurologically nonfocal.   ROUTINE ADMISSION LABS:  Essentially within normal limits.   MENTAL STATUS EXAM:  Fully alert, cooperative but labile, hyper verbal.  Mood irritable, agitated at times, tearful and pressured, with positive  flight of ideas, paranoid ideation, suicidal and homicidal ideation,  contracting in the hospital but appearing somewhat impulsive.  Cognitively  intact, quite distractible but was able to be redirected.  Cognition was  grossly intact.  Judgment and insight impaired.   ADMISSION DIAGNOSES:   AXIS I:  1.  Bipolar disorder, mixed state, with psychotic features.  2.  Alcohol dependence versus abuse.  3.  Rule out substance-induced mood disorder superimposed on bipolar      disorder.   AXIS II:  None/deferred.   AXIS III:  Diabetes mellitus type 2  and liver contusion.   AXIS IV:  Moderate to severe, parenting stress and limited support system,  conflict with interpersonal relationships.   AXIS V:  30/60-65.   HOSPITAL COURSE:  The patient was admitted and ordered routine p.r.n.  medications, underwent further monitoring, and was encouraged to participate  in individual, group and milieu therapy.  Monitored for safety in light of  homicidal and suicidal thoughts.  The patient was started on Tegretol for  mood stabilization and Zoloft continued.  The patient checked for STDs at  request.  The patient clearly had mood swings, described obsessive  behaviors, washing with bleach, using bleach for everything, having to have  3 bottles of bleach always as backup.  Clear OCD symptoms which worsen when  mood is unstable.  Clear mood lability with history of violence, believed  that she had multiple police charges and a record that would keep her from  getting work, however when requested to check this her last charge was many  years ago and she was quite happy about this.  Appeared to have some mood  disability including hypomania during her hospitalization but significant  increase in agitation, anxiety and  improved insight and judgment as well as  awareness of the effect of substances on her mood.  The patient was  discharged in improved condition, mood was euthymic, after a family meeting  and aftercare planning.  Family and patient were comfortable with discharge  plan.  She again was discharged with euthymic mood, affect brighter,  improved judgment and insight, coping skills, no dangerous ideation, and a  good aftercare plan, including plan to be abstinent and seek all substance  abuse treatment resources as well as be compliant with her medications,  accepting her diagnosis and need for medications and therapy.  The patient  was discharged after medication education on:  1.  Protonix 40 mg q.a.m.  2.  Zoloft 50 mg q.a.m.  3.   Nystatin cream Triamcinolone applied to corners of mouth for 2 weeks and      twice a day.  4.  Risperdal 2 mg at 9 p.m.  5.  Depakote ER 250 mg q.a.m. and 2 q.9 p.m.  6.  Trazodone 100 mg q.9 p.m.   DISPOSITION:  The patient was to follow up with Dr. Norville Haggard for diabetes and  asymptomatic pyuria, appointment 11:45 on January 24.  Had been on Septra  for 7 days, a 7 day course, and was to follow up with [facility] with Dr. Laverta Baltimore January 26 at 1:30.   DISCHARGE DIAGNOSES:   AXIS I:  1.  Bipolar disorder, mixed state, with psychotic features.  2.  Alcohol dependence versus abuse.  3.  Rule out substance-induced mood disorder superimposed on bipolar      disorder.   AXIS II:  None/deferred.   AXIS III:  Diabetes mellitus type 2 and liver contusion.   AXIS IV:  Moderate to severe, parenting stress and limited support system,  conflict with interpersonal relationships.   AXIS V:  Global assessment of function on discharge was 55.      JEM/MEDQ  D:  04/05/2004  T:  04/05/2004  Job:  [id number]

## 2010-06-29 NOTE — Discharge Summary (Signed)
   NAME:  Lorraine Turner, Lorraine Turner NO.:  [id number]   MEDICAL RECORD NO.:  [id number]                   PATIENT TYPE:  INP   LOCATION:  9323                                 FACILITY:  [facility]   PHYSICIAN:  Frederico Hamman, M.D.           DATE OF BIRTH:  Mar 09, 1960   DATE OF ADMISSION:  09/27/2001  DATE OF DISCHARGE:  10/02/2001                                 DISCHARGE SUMMARY   HISTORY OF PRESENT ILLNESS:  The patient is a 50 year old gravida 6, para 5  with a normal delivery on September 24, 2001 and tubal ligation on September 25, 2001.  She went home on September 26, 2001 with no complaints.  She was  readmitted on September 27, 2001 with severe back pain and a temperature of  104.  She had positive nitrites, moderate leukocytes, and white cell 15.5  and bilateral CVA tenderness.  The patient had blood cultures done.  Was  placed on Rocephin.  Her sodium was 140, potassium 3.7, chloride 99.  Blood  culture was positive.  Hemoglobin 12.9, white count 15.5 on admission.  Her  cultures grew out group A Streptococcus pyogenes.  Culture grew out E. coli.  The patient rapidly defervesced after receiving Rocephin from the 17th to  the 22nd.  She had been afebrile for greater than three days and was  discharged on October 02, 2001.  Tylenol No.3 for pain.  Ceftin 500 p.o.  b.i.d. for 10 days.  She was asymptomatic.   DISCHARGE DIAGNOSES:  Status post pyelonephritis post delivery and post  tubal ligation.                                               Frederico Hamman, M.D.    BAM/MEDQ  D:  10/02/2001  T:  10/02/2001  Job:  [id number]

## 2010-06-29 NOTE — Discharge Summary (Signed)
NAME:  Lorraine Turner, Lorraine Turner NO.:  [id number]   MEDICAL RECORD NO.:  [id number]                   PATIENT TYPE:  IPS   LOCATION:  0407                                 [facility]ACILITY[facility]  [facility]   PHYSICIAN:  Carlton Adam, M.D.                   DATE OF BIRTH:  1960/12/08   DATE OF ADMISSION:  01/04/2003  DATE OF DISCHARGE:  01/10/2003                                 DISCHARGE SUMMARY   CHIEF COMPLAINT AND PRESENT ILLNESS:  This was the second admission to [facility] for this 50 year old African-American female,  married, voluntarily admitted.  She presented accompanied by the DSS worker  due to two to four weeks of agitation, scrubbing floors, angry outbursts,  and name calling, verbally abusive toward the children.  She was positive  for auditory hallucinations telling her to rob a bank and run away.  She  called the children names.  Voices were hypercritical.  She was sleeping  well.  She endorsed irritability, agitation, mood swings.  She kept bottles  of Clorox and scrubbed constantly.   PAST PSYCHIATRIC HISTORY:  This was the second time at [facility].  She had been on Zoloft and Risperdal.  She stopped medications  secondary to not being able to afford it.   SUBSTANCE ABUSE HISTORY:  She denied the use or abuse of any substances.   PAST MEDICAL HISTORY:  Diabetes mellitus 2, diet controlled.   MEDICATIONS:  None.   PHYSICAL EXAMINATION:  Physical examination was performed, failed to show  any acute findings.   MENTAL STATUS EXAM:  Mental status exam revealed an alert, pleasant,  cooperative female with full affect, normal motor.  Speech was normal rate,  rhythm, and production, no pressure.  Mood: Anxious, depressed.  Affect:  Anxious.  Thought processes: Positive for suicidal ideation without a plan,  auditory hallucinations with vague commands.  Cognitive: Cognition was well  preserved.   ADMISSION DIAGNOSES:   AXIS I:  1. Rule out bipolar, mixed with psychotic features.  2. Rule out impulse disorder, not otherwise specified.   AXIS II:  No diagnosis.   AXIS III:  Diabetes mellitus type 2.   AXIS IV:  Moderate.   AXIS V:  Global assessment of functioning upon admission 25-30, highest  global assessment of functioning in the last year 17.   LABORATORY DATA:  CBC was within normal limits.  Blood chemistries: Glucose  117.  Liver profile was within normal limits.   HOSPITAL COURSE:  She was admitted and started in intensive individual and  group psychotherapy.  She was given Ambien for sleep and she was given  Risperdal 0.5 mg M-Tabs and 1 mg Ativan on a p.r.n. basis.  She was started  on Zoloft 25 mg per day.  She was started on Trileptal 150 mg twice a day  and at bedtime and able eventually to  increase to 150 mg twice a day and 300  mg at bedtime.  She was maintained on the Risperdal 0.25 mg twice a day and  0.5 mg at night.  She tolerated the medications quite well.  She did endorse  that she was unable to afford her medications and that is why she  discontinued them.  She did endorse increased symptoms, auditory  hallucinations.  Once back on medications, she started feeling better,  voices started decreasing.  She had some bladder pain.  Testing was positive  for urinary tract infection and she was treated.  She tolerated the  medications well.  There were no further mood swings, denied any further  hallucinations.  Overall, she was feeling much better.  On November 29, she  was in full contact with reality, no suicidal ideas, no homicidal ideas, no  delusions, no hallucinations, willing and motivated to pursue further  outpatient treatment, so we went ahead and discharged to outpatient  followup.   DISCHARGE DIAGNOSES:   AXIS I:  1. Bipolar disorder, mixed with psychosis.  2. Impulse control disorder, not otherwise specified.   AXIS II:  No diagnosis.   AXIS III:  Diabetes  mellitus type 2.   AXIS IV:  Moderate.   AXIS V:  Global assessment of functioning upon discharge 50.   DISCHARGE MEDICATIONS:  1. Risperdal 0.5 mg one half twice a day and one at night.  2. Zoloft 50 mg per day.  3. Septra DS one every 12 hours.  4. Trileptal 150 mg twice a day and two at night.  5. Ativan 1 mg twice a day as needed for anxiety.  6. Seroquel 25 mg one to two at bedtime as needed for sleep.   FOLLOW UP:  She was to follow up with [facility], Dr.  Marvel Plan.                                               Carlton Adam, M.D.    IL/MEDQ  D:  01/27/2003  T:  01/28/2003  Job:  [id number]

## 2010-06-29 NOTE — H&P (Signed)
NAME:  Lorraine Turner, Lorraine Turner NO.:  [id number]   MEDICAL RECORD NO.:  [id number]          PATIENT TYPE:  EMS   LOCATION:  ED                           [facility]ACILITY:  [facility]   PHYSICIAN:  Aquilla Hacker, M.D. DATE OF BIRTH:  February 17, 1960   DATE OF ADMISSION:  02/27/2004  DATE OF DISCHARGE:                                HISTORY & PHYSICAL   CHIEF COMPLAINT:  I took some pills.   HISTORY OF PRESENT ILLNESS:  Lorraine Turner is a 50 year old female with an  extensive history of psychiatric problems and diabetes mellitus who states  that she took some Lorazepam tablets, approximately 7-9 earlier today.  She  cannot recall the time during which she took the tablets.  She states that  she was trying to hurt herself but did not want to die.  The reason she gave  was that things around Lorraine were not getting any better.  She said Lorraine  Turner is currently homeless and Lorraine husband refused to let Lorraine stay with  them.  She currently denies having any pain or difficulties breathing.  She  went on to say that she and Lorraine husband argues often and that she has  attempted to kill herself in the past following a situation in which Lorraine  husband cleaned their house better than she did and over frustrations that  he was able to clean the house better than she did, she tried to harm  herself.   PAST MEDICAL HISTORY:  1.  Diabetes mellitus.  2.  Major depression with psychosis.  According to previous notes in the      computer, the patient has been followed at [facility]      but has history of noncompliance with Lorraine appointments secondary to      transportation.  She has had multiple psychiatric admissions with [facility] [place].  She      has been tried on multiple medications which include Zoloft and      Risperdal, and again, she has a history of harming herself.  3.  Past suicide attempts.  4.  OCD.   PAST  SURGICAL HISTORY:  None.   ALLERGIES:  None.   HOSPITAL MEDICATIONS:  1.  Lorazepam.  2.  Zoloft.  The patient does not know the dosages of either of these medications.   FAMILY HISTORY:  Mother has hypertension and end stage renal disease.  Father has Alzheimer's disease.   SOCIAL HISTORY:  Cigarettes denied; alcohol denied; cocaine, the patient  states never.   REVIEW OF SYSTEMS:  No chest pain or shortness of breath.  All other systems  are as HPI, otherwise, negative.   PHYSICAL EXAMINATION:  GENERAL APPEARANCE:  The patient is very sleepy and  initially very difficult to arouse.  However, after calling Lorraine name  multiple times and nudging Lorraine shoulder, she begins to arouse and answers  questions that are presented to Lorraine.  VITAL SIGNS:  Heart rate 79, blood pressure 113/77, SPO2 100% on room air,  temperature  100.4, respirations 24.  HEENT:  Normocephalic.  Extraocular muscles intact.  Pupils equal, round and  reactive to light.  NECK:  Supple, no lymphadenopathy.  Thyroid not palpable.  CARDIAC:  S1, S2 present.  Regular rate and rhythm.  No S3, S4.  RESPIRATORY:  Decreased breath sounds bilaterally.  ABDOMEN:  Soft, nontender, nondistended, positive bowel sounds.  No  organomegaly.  EXTREMITIES:  No leg edema.  NEUROLOGICAL:  The patient is alert and oriented x3.  However, the patient  is very sleepy.  Cranial nerves 2-12 are intact.  MUSCULOSKELETAL:  Upper and lower extremity strength 5/5.   LABORATORY DATA:  Urine drug screen is negative.  UA nitrites positive,  leukocytes negative, WBC 0-2.   ASSESSMENT/PLAN:  1.  Attempted suicide.  Will admit the patient to the step down unit for      closer observation.  Will provide aggressive IV fluid hydration for now.      The patient received one dose of Narcan in the emergency room.  Will      monitor Lorraine airway breathing and circulation closely, and if either of      these become compromised, will address as needed.   Will also consider      additional dosage of Narcan if needed.  Once the patient is medically      deemed clear, will attempt to transfer the patient over the [facility] so that she can have more intense evaluation and treatment by      psychiatry.  2.  Diabetes mellitus.  Will perform Accu-Checks a.c. and q.h.s. for now.      Will initiate sliding scale insulin if the patient shows that she needs      this regimen implemented.  3.  GI prophylaxis.  Will provide Protonix 40 mg daily.  4.  DVT prophylaxis.  Will provide heparin 5000 units subcu 12 hours.      OR/MEDQ  D:  02/27/2004  T:  02/27/2004  Job:  [id number]

## 2010-06-29 NOTE — Discharge Summary (Signed)
[place]  Patient:    Lorraine Turner, Lorraine Turner Visit Number: [id number] MRN: [id number]          Service Type: PSY Location: [place] Attending Physician:  Stark Jock Dictated by:   Woodroe Chen Sabra Heck, M.D. Admit Date:  06/21/2001 Discharge Date: 06/24/2001                             Discharge Summary  CHIEF COMPLAINT AND PRESENT ILLNESS:  This was the first admission to [facility] for this 50 year old female, six months pregnant, with a history of mental illness.  Stopped her medications when she became pregnant.  She stopped the medications as she was afraid she did not need them anymore.  Continued to take Zoloft every other day.  Stopped her Risperdal. Became angry with her husband.  Reported voices.  Fearing her husband will leave her.  Someone named ________ was telling her things.  She was found wandering to close to the road.  She claimed that she just wanted to walk, had so many thoughts in her head that she wanted to think straight.  She went home, started throwing clothes out of the house and threw a chair out of the house.  Thoughts of suicidal ideas without a specific plan.  Endorses auditory hallucinations.  PAST PSYCHIATRIC HISTORY:  [facility].  Noncompliant with medication.  Has been inpatient to [facility] in [place], [place].  Thoughts in the past of attempting to jump off bridges.  ALCOHOL/DRUG HISTORY:  Denies the use or abuse of any substances.  PHYSICAL EXAMINATION:  Performed and failed to show any acute findings.  MENTAL STATUS EXAMINATION:  Medium-built, fully alert female, adequate hygiene.  No acute distress.  Constricted, anxious affect, episodes of tearfulness.  Cooperative and polite.  Speech normal in pace.  Mood is anxious and depressed.  Thought process generally logical and coherent.  Episode of thought disorganization.  Displayed  some thought-blocking and some slight response latency in her speech pattern.  Positive with suicidal ideation without a plan.  Cognition well-preserved.  ADMISSION DIAGNOSES: Axis I:    1. Major depression, recurrent with psychotic features.            2. Obsessive-compulsive disorder. Axis II:   No diagnosis. Axis III:  1. Yeast infection by history.            2. Pregnancy, 25 weeks. Axis IV:   Moderate. Axis V:    Global Assessment of Functioning upon admission 28; highest Global            Assessment of Functioning in the last year 38.  HOSPITAL COURSE:  She was admitted and started intensive individual and group psychotherapy.  She was placed back on her medication, basically her Risperdal.  Zoloft 50 mg per day.  Risperdal 0.5 mg at bedtime.  She gradually started responding to the medication.  Her mood improved.  Her affect became brighter.  There was a family session where she was encouraged and supported by the husband.  Issues of self-esteem, ruminating about her appearance, how she is not attractive.  These thoughts were challenged and she started working on coping skills.  When the family session with the husband went well, that was very reassuring.  On Jun 24, 2001, she was in full contact with reality, understood the need to stay on medication.  No active suicidal ideation.  No homicidal ideation.  Was going to be working closely with mental health.  As was everything was in place, we discharged to outpatient follow-up.  DISCHARGE DIAGNOSES: Axis I:    1. Major depression with psychotic features versus schizoaffective               disorder, depressed.            2. Obsessive-compulsive disorder. Axis II:   No diagnosis. Axis III:  Pregnancy. Axis IV:   Moderate. Axis V:    Global Assessment of Functioning upon discharge 55.  DISCHARGE MEDICATIONS: 1. Zoloft 50 mg daily. 2. Risperdal 0.5 mg at bedtime. 3. Ambien as needed for sleep. 4. Macrobid for a urinary  tract infection.  FOLLOW-UP:  [facility]. Dictated by:   Woodroe Chen Sabra Heck, M.D. Attending Physician:  Stark Jock DD:  07/22/01 TD:  07/25/01 Job: [id number]

## 2010-09-19 ENCOUNTER — Emergency Department (HOSPITAL_COMMUNITY)
Admission: EM | Admit: 2010-09-19 | Discharge: 2010-09-19 | Disposition: A | Discharge status: Home / Self Care | Payer: Medicaid Other | Attending: Emergency Medicine | Admitting: Emergency Medicine

## 2010-09-19 DIAGNOSIS — F319 Bipolar disorder, unspecified: Secondary | ICD-10-CM | POA: Insufficient documentation

## 2010-09-19 DIAGNOSIS — E7800 Pure hypercholesterolemia, unspecified: Secondary | ICD-10-CM | POA: Insufficient documentation

## 2010-09-19 DIAGNOSIS — E119 Type 2 diabetes mellitus without complications: Secondary | ICD-10-CM | POA: Insufficient documentation

## 2010-09-19 DIAGNOSIS — F411 Generalized anxiety disorder: Secondary | ICD-10-CM | POA: Insufficient documentation

## 2010-09-19 LAB — DIFFERENTIAL
Basophils Absolute: 0 10*3/uL (ref 0.0–0.1)
Eosinophils Absolute: 0 10*3/uL (ref 0.0–0.7)
Eosinophils Relative: 0 % (ref 0–5)
Lymphocytes Relative: 18 % (ref 12–46)

## 2010-09-19 LAB — CBC
Platelets: 267 10*3/uL (ref 150–400)
RDW: 12.7 % (ref 11.5–15.5)
WBC: 7.5 10*3/uL (ref 4.0–10.5)

## 2010-09-19 LAB — BASIC METABOLIC PANEL
Chloride: 100 mEq/L (ref 96–112)
GFR calc Af Amer: >60 mL/min (ref >60)
Potassium: 3.7 mEq/L (ref 3.5–5.1)
Sodium: 134 mEq/L — ABNORMAL LOW (ref 135–145)

## 2010-09-19 LAB — GLUCOSE, CAPILLARY
Glucose-Capillary: 429 mg/dL — ABNORMAL HIGH (ref 70–99)
Glucose-Capillary: 106 mg/dL — ABNORMAL HIGH (ref 70–99)

## 2010-11-19 LAB — URINALYSIS, ROUTINE W REFLEX MICROSCOPIC
Nitrite: NEGATIVE
Specific Gravity, Urine: 1.027
Urobilinogen, UA: 0.2

## 2010-11-19 LAB — URINE MICROSCOPIC-ADD ON

## 2010-11-19 LAB — COMPREHENSIVE METABOLIC PANEL
ALT: 14
AST: 13
Alkaline Phosphatase: 73
GFR calc Af Amer: >60
Glucose, Bld: 181 — ABNORMAL HIGH
Potassium: 3.4 — ABNORMAL LOW
Sodium: 138
Total Protein: 6.9

## 2010-11-19 LAB — DIFFERENTIAL
Basophils Relative: 1
Eosinophils Absolute: 0.2
Eosinophils Relative: 2
Lymphs Abs: 2.4
Monocytes Absolute: 0.6
Monocytes Relative: 8
Neutrophils Relative %: 59

## 2010-11-19 LAB — RAPID URINE DRUG SCREEN, HOSP PERFORMED
Amphetamines: NOT DETECTED
Cocaine: NOT DETECTED
Tetrahydrocannabinol: NOT DETECTED

## 2010-11-19 LAB — CBC
Hemoglobin: 12.2
RBC: 3.97
RDW: 13.3

## 2010-11-19 LAB — ETHANOL: Alcohol, Ethyl (B): <5

## 2011-01-15 ENCOUNTER — Emergency Department (HOSPITAL_COMMUNITY)
Admission: EM | Admit: 2011-01-15 | Discharge: 2011-01-15 | Disposition: A | Discharge status: Home / Self Care | Payer: Self-pay | Attending: Emergency Medicine | Admitting: Emergency Medicine

## 2011-01-15 ENCOUNTER — Encounter: Payer: Self-pay | Admitting: Emergency Medicine

## 2011-01-15 ENCOUNTER — Emergency Department (HOSPITAL_COMMUNITY): Payer: Self-pay

## 2011-01-15 DIAGNOSIS — R079 Chest pain, unspecified: Secondary | ICD-10-CM | POA: Insufficient documentation

## 2011-01-15 DIAGNOSIS — S20219A Contusion of unspecified front wall of thorax, initial encounter: Secondary | ICD-10-CM | POA: Insufficient documentation

## 2011-01-15 DIAGNOSIS — I10 Essential (primary) hypertension: Secondary | ICD-10-CM | POA: Insufficient documentation

## 2011-01-15 DIAGNOSIS — R0602 Shortness of breath: Secondary | ICD-10-CM | POA: Insufficient documentation

## 2011-01-15 DIAGNOSIS — E119 Type 2 diabetes mellitus without complications: Secondary | ICD-10-CM | POA: Insufficient documentation

## 2011-01-15 DIAGNOSIS — Z79899 Other long term (current) drug therapy: Secondary | ICD-10-CM | POA: Insufficient documentation

## 2011-01-15 DIAGNOSIS — N644 Mastodynia: Secondary | ICD-10-CM | POA: Insufficient documentation

## 2011-01-15 HISTORY — DX: Essential (primary) hypertension: I10

## 2011-01-15 MED ORDER — HYDROCODONE-ACETAMINOPHEN 5-325 MG PO TABS: 1.0000 | ORAL_TABLET | ORAL | Status: AC | PRN

## 2011-01-15 NOTE — ED Notes (Signed)
PT returned from xray
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
## 2011-01-15 NOTE — ED Notes (Signed)
Patient is resting comfortably. 
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
## 2011-01-15 NOTE — ED Notes (Signed)
Pt ambulated with a steady gait; VSS; no acute signs of distress. Pt reported she will follow d/c instructions.

## 2011-01-15 NOTE — ED Notes (Signed)
Patient transported to X-ray 

## 2011-08-12 ENCOUNTER — Encounter (HOSPITAL_COMMUNITY): Payer: Self-pay | Admitting: *Deleted

## 2011-08-12 ENCOUNTER — Emergency Department (HOSPITAL_COMMUNITY)
Admission: EM | Admit: 2011-08-12 | Discharge: 2011-08-14 | Disposition: A | Discharge status: Other Acute Inpatient Hospital | Payer: Self-pay | Attending: Emergency Medicine | Admitting: Emergency Medicine

## 2011-08-12 DIAGNOSIS — F200 Paranoid schizophrenia: Secondary | ICD-10-CM

## 2011-08-12 DIAGNOSIS — F209 Schizophrenia, unspecified: Secondary | ICD-10-CM | POA: Insufficient documentation

## 2011-08-12 DIAGNOSIS — F319 Bipolar disorder, unspecified: Secondary | ICD-10-CM | POA: Insufficient documentation

## 2011-08-12 DIAGNOSIS — R45851 Suicidal ideations: Secondary | ICD-10-CM | POA: Insufficient documentation

## 2011-08-12 DIAGNOSIS — I10 Essential (primary) hypertension: Secondary | ICD-10-CM | POA: Insufficient documentation

## 2011-08-12 DIAGNOSIS — E119 Type 2 diabetes mellitus without complications: Secondary | ICD-10-CM | POA: Insufficient documentation

## 2011-08-12 HISTORY — DX: Major depressive disorder, single episode, unspecified: F32.9

## 2011-08-12 HISTORY — DX: Bipolar disorder, unspecified: F31.9

## 2011-08-12 HISTORY — DX: Anxiety disorder, unspecified: F41.9

## 2011-08-12 HISTORY — DX: Depression, unspecified: F32.A

## 2011-08-12 HISTORY — DX: Schizophrenia, unspecified: F20.9

## 2011-08-12 LAB — ETHANOL: Alcohol, Ethyl (B): <11 mg/dL (ref 0–11)

## 2011-08-12 LAB — GLUCOSE, CAPILLARY: Glucose-Capillary: 145 mg/dL — ABNORMAL HIGH (ref 70–99)

## 2011-08-12 LAB — COMPREHENSIVE METABOLIC PANEL
Albumin: 4.1 g/dL (ref 3.5–5.2)
Alkaline Phosphatase: 65 U/L (ref 39–117)
BUN: 13 mg/dL (ref 6–23)
Potassium: 3.9 mEq/L (ref 3.5–5.1)
Sodium: 137 mEq/L (ref 135–145)
Total Protein: 7.8 g/dL (ref 6.0–8.3)

## 2011-08-12 LAB — CBC
HCT: 36.8 % (ref 36.0–46.0)
MCHC: 34.5 g/dL (ref 30.0–36.0)
RDW: 13.1 % (ref 11.5–15.5)

## 2011-08-12 LAB — RAPID URINE DRUG SCREEN, HOSP PERFORMED
Amphetamines: NOT DETECTED
Benzodiazepines: NOT DETECTED

## 2011-08-12 MED ORDER — METFORMIN HCL 500 MG PO TABS
1000.0000 mg | ORAL_TABLET | Freq: Two times a day (BID) | ORAL | Status: DC
  Administered 2011-08-13 – 2011-08-14 (×3): 1000 mg via ORAL
  Filled 2011-08-12 (×5): qty 2

## 2011-08-12 MED ORDER — ATORVASTATIN CALCIUM 10 MG PO TABS
10.0000 mg | ORAL_TABLET | Freq: Every day | ORAL | Status: DC
  Administered 2011-08-13 – 2011-08-14 (×2): 10 mg via ORAL
  Filled 2011-08-12 (×2): qty 1

## 2011-08-12 MED ORDER — LISINOPRIL 10 MG PO TABS
10.0000 mg | ORAL_TABLET | Freq: Every day | ORAL | Status: DC
  Administered 2011-08-13 – 2011-08-14 (×2): 10 mg via ORAL
  Filled 2011-08-12 (×2): qty 1

## 2011-08-12 MED ORDER — SERTRALINE HCL 50 MG PO TABS
100.0000 mg | ORAL_TABLET | Freq: Every day | ORAL | Status: DC
  Administered 2011-08-13 – 2011-08-14 (×2): 100 mg via ORAL
  Filled 2011-08-12 (×2): qty 2

## 2011-08-12 MED ORDER — LORAZEPAM 1 MG PO TABS: 1.0000 mg | ORAL_TABLET | Freq: Three times a day (TID) | ORAL | Status: DC | PRN

## 2011-08-12 MED ORDER — LINAGLIPTIN 5 MG PO TABS
5.0000 mg | ORAL_TABLET | Freq: Every day | ORAL | Status: DC
  Administered 2011-08-13 – 2011-08-14 (×2): 5 mg via ORAL
  Filled 2011-08-12 (×2): qty 1
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
## 2011-08-12 NOTE — ED Notes (Signed)
Patient and belongings wanded by security. Patient changed into blue scrubs and red socks.

## 2011-08-12 NOTE — ED Notes (Signed)
Patient has one silver necklace, one black and red necklace, one tan and black necklace, one cross necklace, one brown and peach and green beaded necklace, one shell necklace, one silver earring, one blue and silver bracelet, one silver stud nose ring, one white long sleeve shirt, one pair of gold flip flops, one brown tank top, one white bra, one pair camo pants, one brown belt, one pair underwear, one brown wallet, one black eyeliner, $1 dollar bill, $1.07 in change. All in white belonging bag. One green and blue bookbag. One taser that looks like a pack of marlboro cigarettes locked up with security.

## 2011-08-12 NOTE — ED Provider Notes (Signed)
History     CSN: [id number]  Arrival date & time 08/12/11  [id number]   First MD Initiated Contact with Patient 08/12/11 1901      Chief Complaint  Patient presents with  . Medical Clearance  . Suicidal    (Consider location/radiation/quality/duration/timing/severity/associated sxs/prior treatment) The history is provided by the patient.  pt w hx bipolar disorder, anxiety, c/o worsening depression and anxiety. States a lot of stress related to money and financial issues. States her children are homeless, and spouse gets upset with her when she gives them money. States at times feels almost hopeless. Denies any thoughts of harm to self or others. No overdose or attempt to harm self. Denies any prior attempt to harm self.  States her physical health has been at baseline. States her diabetes is well controlled, states is compliant w taking her medications.      Past Medical History  Diagnosis Date  . Hypertension   . Diabetes mellitus   . Bipolar 1 disorder   . Schizophrenia   . Anxiety   . Depression     No past surgical history on file.  No family history on file.  History  Substance Use Topics  . Smoking status: Never Smoker   . Smokeless tobacco: Not on file  . Alcohol Use: No    OB History    Grav Para Term Preterm Abortions TAB SAB Ect Mult Living                  Review of Systems  Constitutional: Negative for fever.  HENT: Negative for neck pain.   Eyes: Negative for redness.  Respiratory: Negative for shortness of breath.   Cardiovascular: Negative for chest pain.  Gastrointestinal: Negative for abdominal pain.  Genitourinary: Negative for flank pain.  Musculoskeletal: Negative for back pain.  Skin: Negative for rash.  Neurological: Negative for headaches.  Hematological: Does not bruise/bleed easily.  Psychiatric/Behavioral: Positive for dysphoric mood.    Allergies  Review of patient's allergies indicates no known allergies.  Home Medications    Current Outpatient Rx  Name Route Sig Dispense Refill  . ATORVASTATIN CALCIUM 10 MG PO TABS Oral Take 10 mg by mouth daily.      . GLYBURIDE-METFORMIN PO Oral Take 1 tablet by mouth 2 (two) times daily.    Marland Kitchen LISINOPRIL 10 MG PO TABS Oral Take 10 mg by mouth daily.      . SERTRALINE HCL 100 MG PO TABS Oral Take 100 mg by mouth daily.      Marland Kitchen JANUVIA PO Oral Take 1 tablet by mouth daily.      BP 130/73  Pulse 75  Temp 98.7 F (37.1 C) (Oral)  Resp 18  SpO2 100%  Physical Exam  Nursing note and vitals reviewed. Constitutional: She is oriented to person, place, and time. She appears well-developed and well-nourished. No distress.  HENT:  Head: Atraumatic.  Nose: Nose normal.  Mouth/Throat: Oropharynx is clear and moist.  Eyes: Conjunctivae are normal. Pupils are equal, round, and reactive to light. No scleral icterus.  Neck: Neck supple. No tracheal deviation present.  Cardiovascular: Normal rate.   Pulmonary/Chest: Effort normal. No respiratory distress.  Abdominal: Soft. Normal appearance. She exhibits no distension. There is no tenderness.  Musculoskeletal: She exhibits no edema.  Neurological: She is alert and oriented to person, place, and time.       Steady gait  Skin: Skin is warm and dry. No rash noted.  Psychiatric:  Tearful, depressed. Denies thoughts of harm to self or others.     ED Course  Procedures (including critical care time)   Labs Reviewed  CBC  COMPREHENSIVE METABOLIC PANEL  ETHANOL  URINE RAPID DRUG SCREEN (HOSP PERFORMED)      MDM  Labs. Ativan 1 mg po. Act team called. telepsych eval.  Reviewed nursing notes and prior charts for additional history.    Verified pts diabetes meds with her - pt states currently only taking metformin 1000 mg bid, and januvia 100 mg a day for her diabetes. States also states zoloft, bp med, and lipitor.   Recheck calmer, alert. Awaiting act eval.       Mirna Mires, MD 08/12/11 2213
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
## 2011-08-13 MED ORDER — DICYCLOMINE HCL 10 MG/ML IM SOLN
20.0000 mg | Freq: Once | INTRAMUSCULAR | Status: AC
  Administered 2011-08-13: 20 mg via INTRAMUSCULAR
  Filled 2011-08-13: qty 2

## 2011-08-13 MED ORDER — DIPHENHYDRAMINE HCL 25 MG PO CAPS
25.0000 mg | ORAL_CAPSULE | Freq: Four times a day (QID) | ORAL | Status: DC | PRN
  Administered 2011-08-13: 25 mg via ORAL
  Filled 2011-08-13: qty 1

## 2011-08-13 MED ORDER — POLYETHYLENE GLYCOL 3350 17 G PO PACK
17.0000 g | PACK | Freq: Every day | ORAL | Status: DC
  Administered 2011-08-13: 17 g via ORAL
  Filled 2011-08-13 (×2): qty 1

## 2011-08-13 NOTE — ED Notes (Signed)
Pt complains of itching on her back from a bug bite.  Benadryl ordered and given as directed.  Pt resting quietly in room.

## 2011-08-13 NOTE — ED Provider Notes (Addendum)
BP 131/66  Pulse 52  Temp 98.3 F (36.8 C) (Oral)  Resp 18  SpO2 99%  Patient seen and evaluated by me. No complaints at this time. Telepsych pending.   Blair Heys, MD 08/13/11 0750 BP 131/66  Pulse 62  Temp 98.3 F (36.8 C) (Oral)  Resp 18  SpO2 99%  Per nsg staff pt with small insect bite that is itching at this time. Benadryl prn ordered.  Blair Heys, MD 08/13/11 1031
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
## 2011-08-14 ENCOUNTER — Encounter (HOSPITAL_COMMUNITY): Payer: Self-pay

## 2011-08-14 ENCOUNTER — Inpatient Hospital Stay (HOSPITAL_COMMUNITY)
Admission: AD | Admit: 2011-08-14 | Discharge: 2011-08-16 | DRG: 885 | Disposition: A | Discharge status: Home / Self Care | Payer: No Typology Code available for payment source | Source: Ambulatory Visit | Attending: Psychiatry | Admitting: Psychiatry

## 2011-08-14 DIAGNOSIS — Z79899 Other long term (current) drug therapy: Secondary | ICD-10-CM

## 2011-08-14 DIAGNOSIS — I10 Essential (primary) hypertension: Secondary | ICD-10-CM | POA: Diagnosis present

## 2011-08-14 DIAGNOSIS — F251 Schizoaffective disorder, depressive type: Secondary | ICD-10-CM | POA: Diagnosis present

## 2011-08-14 DIAGNOSIS — Z9114 Patient's other noncompliance with medication regimen: Secondary | ICD-10-CM

## 2011-08-14 DIAGNOSIS — Z9119 Patient's noncompliance with other medical treatment and regimen: Secondary | ICD-10-CM

## 2011-08-14 DIAGNOSIS — Z91199 Patient's noncompliance with other medical treatment and regimen due to unspecified reason: Secondary | ICD-10-CM

## 2011-08-14 DIAGNOSIS — F32A Depression, unspecified: Secondary | ICD-10-CM | POA: Diagnosis present

## 2011-08-14 DIAGNOSIS — Z91148 Patient's other noncompliance with medication regimen for other reason: Secondary | ICD-10-CM

## 2011-08-14 DIAGNOSIS — E119 Type 2 diabetes mellitus without complications: Secondary | ICD-10-CM | POA: Diagnosis present

## 2011-08-14 DIAGNOSIS — F259 Schizoaffective disorder, unspecified: Principal | ICD-10-CM | POA: Diagnosis present

## 2011-08-14 MED ORDER — ALUM & MAG HYDROXIDE-SIMETH 200-200-20 MG/5ML PO SUSP: 30.0000 mL | ORAL | Status: DC | PRN

## 2011-08-14 MED ORDER — LINAGLIPTIN 5 MG PO TABS
5.0000 mg | ORAL_TABLET | Freq: Every day | ORAL | Status: DC
  Administered 2011-08-15 – 2011-08-16 (×2): 5 mg via ORAL
  Filled 2011-08-14 (×5): qty 1

## 2011-08-14 MED ORDER — SERTRALINE HCL 100 MG PO TABS
100.0000 mg | ORAL_TABLET | Freq: Every day | ORAL | Status: DC
  Administered 2011-08-15 – 2011-08-16 (×2): 100 mg via ORAL
  Filled 2011-08-14: qty 1
  Filled 2011-08-14: qty 14
  Filled 2011-08-14 (×3): qty 1

## 2011-08-14 MED ORDER — MAGNESIUM HYDROXIDE 400 MG/5ML PO SUSP: 30.0000 mL | Freq: Every day | ORAL | Status: DC | PRN

## 2011-08-14 MED ORDER — METFORMIN HCL 500 MG PO TABS
1000.0000 mg | ORAL_TABLET | Freq: Two times a day (BID) | ORAL | Status: DC
  Administered 2011-08-14 – 2011-08-16 (×4): 1000 mg via ORAL
  Filled 2011-08-14 (×9): qty 2

## 2011-08-14 MED ORDER — LISINOPRIL 20 MG PO TABS
20.0000 mg | ORAL_TABLET | Freq: Every day | ORAL | Status: DC
  Administered 2011-08-15 – 2011-08-16 (×2): 20 mg via ORAL
  Filled 2011-08-14 (×4): qty 1

## 2011-08-14 MED ORDER — GLYBURIDE 5 MG PO TABS
10.0000 mg | ORAL_TABLET | Freq: Two times a day (BID) | ORAL | Status: DC
  Administered 2011-08-14 – 2011-08-16 (×4): 10 mg via ORAL
  Filled 2011-08-14 (×9): qty 2

## 2011-08-14 MED ORDER — TRAZODONE HCL 50 MG PO TABS
50.0000 mg | ORAL_TABLET | Freq: Every evening | ORAL | Status: DC | PRN
  Administered 2011-08-14 – 2011-08-15 (×2): 50 mg via ORAL
  Filled 2011-08-14 (×2): qty 1
  Filled 2011-08-14: qty 28

## 2011-08-14 MED ORDER — ACETAMINOPHEN 325 MG PO TABS: 650.0000 mg | ORAL_TABLET | Freq: Four times a day (QID) | ORAL | Status: DC | PRN

## 2011-08-14 MED ORDER — SIMVASTATIN 10 MG PO TABS
10.0000 mg | ORAL_TABLET | Freq: Every day | ORAL | Status: DC
  Administered 2011-08-14 – 2011-08-15 (×2): 10 mg via ORAL
  Filled 2011-08-14 (×5): qty 1

## 2011-08-14 MED ORDER — GLYBURIDE-METFORMIN 5-500 MG PO TABS: 2.0000 | ORAL_TABLET | Freq: Two times a day (BID) | ORAL | Status: DC

## 2011-08-14 MED ORDER — WHITE PETROLATUM GEL
Status: AC
  Administered 2011-08-14: 13:00:00
  Filled 2011-08-14: qty 5

## 2011-08-14 NOTE — Progress Notes (Signed)
Pt is a new admit to the unit this evening.  She met with PA and then went to group, so interaction was minimal.  She reports she has been feeling anxious concerning her home situation.  She has a hx of schizophrenia, and hears voices, but is not hearing any at the time of this assessment.  She denies SI/HI.  She has been non-compliant with her medications for about 4 mons.  Since coming on the unit pt has been appropriate/cooperative.  Pt is encouraged to make her needs known to staff.  Pt made request for a sleep aid.  Order obtained.  Pt voices no other needs/concerns.  Safety maintained with q15 minute checks.
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
## 2011-08-14 NOTE — ED Provider Notes (Signed)
Filed Vitals:   08/14/11 0615  BP: 133/80  Pulse: 63  Temp: 98.2 F (36.8 C)  Resp: 18    Pt without complaints this am.  Resting comfortably.  Awaiting placement.  Kathalene Frames, MD 08/14/11 [phone]

## 2011-08-14 NOTE — Progress Notes (Signed)
Psychoeducational Group Note  Date:  08/14/2011 Time:  2113  Group Topic/Focus:  Wrap-Up Group:   The focus of this group is to help patients review their daily goal of treatment and discuss progress on daily workbooks.  Participation Level:  Active  Participation Quality:  Appropriate  Affect:  Appropriate  Cognitive:  Appropriate  Insight:  Good  Engagement in Group:  Good  Additional Comments:  Pt interacted well.  Wynelle Fanny R 08/14/2011, 9:14 PM
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
## 2011-08-14 NOTE — ED Notes (Signed)
Pt has been prepared for discharge. She has been accepted to [facility] and is waiting on a 400 hall bed to open.
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
## 2011-08-14 NOTE — Tx Team (Signed)
Initial Interdisciplinary Treatment Plan  PATIENT STRENGTHS: (choose at least two) Capable of independent living General fund of knowledge Motivation for treatment/growth Supportive family/friends  PATIENT STRESSORS: Educational concerns Financial difficulties Marital or family conflict   PROBLEM LIST: Problem List/Patient Goals Date to be addressed Date deferred Reason deferred Estimated date of resolution  Anxiety       Depression       Schizophrenia                                            DISCHARGE CRITERIA:  Improved stabilization in mood, thinking, and/or behavior Need for constant or close observation no longer present Verbal commitment to aftercare and medication compliance  PRELIMINARY DISCHARGE PLAN: Attend aftercare/continuing care group Participate in family therapy  PATIENT/FAMIILY INVOLVEMENT: This treatment plan has been presented to and reviewed with the patient, Lorraine Turner, and/or family member, .  The patient and family have been given the opportunity to ask questions and make suggestions.  [name] 08/14/2011, 6:34 PM

## 2011-08-14 NOTE — H&P (Signed)
Psychiatric Admission Assessment Adult  Patient Identification:  Lorraine Turner Date of Evaluation:  08/14/2011 51 yo MAAF  CC: noncompliance with psych meds 4 mos now SI & depression  History of Present Illness: Stopped her meds 4 months ago as she felt better and hadn't gotten patient assistance yet.Today got into it with her husband because he doesn't want her to give money to her 45yo son who is homeless. Because she has been off her meds she escalated and began to have feelings that she would harm her husband and so she left the house. Has AH command in nature at times to harm self or others but does not report an antipsychotic in current meds. Can check with [facility] tomorrow regarding when last prescribed an antipsychotic.     Past Psychiatric History: Diagnosed age 50  Used to cut was picked on at school  [facility] July 11-14 2011 paranoid that husband was unfaithful   Substance Abuse History:  Social History:    reports that she has never smoked. She does not have any smokeless tobacco history on file. She reports that she does not drink alcohol or use illicit drugs. HS 1981 2 years of college. Married once husband is 61 they have 3 children 2 daughters 13&10 one son age 34 has 57 other children a son 66 and daughter 40 from prior relationships.  Family Psych History: Denies   Past Medical History:     Past Medical History  Diagnosis Date  . Hypertension   . Diabetes mellitus   . Bipolar 1 disorder   . Schizophrenia   . Anxiety   . Depression       History reviewed. No pertinent past surgical history.  Allergies: No Known Allergies  Current Medications:  Prior to Admission medications   Medication Sig Start Date End Date Taking? Authorizing Provider  atorvastatin (LIPITOR) 10 MG tablet Take 10 mg by mouth daily.      Historical Provider, MD  glyBURIDE-metformin (GLUCOVANCE) 5-500 MG per tablet Take 2 tablets by mouth 2 (two) times daily.    Historical Provider, MD    lisinopril (PRINIVIL,ZESTRIL) 20 MG tablet Take 20 mg by mouth daily.    Historical Provider, MD  pravastatin (PRAVACHOL) 20 MG tablet Take 20 mg by mouth daily.    Historical Provider, MD  sertraline (ZOLOFT) 100 MG tablet Take 100 mg by mouth daily.      Historical Provider, MD  sitaGLIPtin (JANUVIA) 100 MG tablet Take 100 mg by mouth daily.    Historical Provider, MD    Mental Status Examination/Evaluation: Objective:  Appearance: Casual  Psychomotor Activity:  Normal  Eye Contact::  Good  Speech:  Normal Rate  Volume:  Normal  Mood: has calmed down    Affect:  Full Range  Thought Process: clear rational goal oriented -get back on meds    Orientation:  Full  Thought Content:  Denies AVH but does feel watched/criticized at times   Suicidal Thoughts:  No  Homicidal Thoughts:  No  Judgement:  Intact  Insight:  Present    DIAGNOSIS:    AXIS I Schizoaffective Disorder  AXIS II Used to cut when younger   AXIS III See medical history. DM HTN   AXIS IV Husband doesn't want her to give money to 58 yo son -not his biological   AXIS V 51-60 moderate symptoms     Treatment Plan Summary: Admit for safety & stabilization  Restart her Zoloft Neurontin and Trazadone Return to [place] and  Dr.Taylor Agree with H&P from [facility]

## 2011-08-15 DIAGNOSIS — F259 Schizoaffective disorder, unspecified: Principal | ICD-10-CM

## 2011-08-15 LAB — GLUCOSE, CAPILLARY
Glucose-Capillary: 133 mg/dL — ABNORMAL HIGH (ref 70–99)
Glucose-Capillary: 83 mg/dL (ref 70–99)
Glucose-Capillary: 92 mg/dL (ref 70–99)

## 2011-08-15 LAB — T4, FREE: Free T4: 1.12 ng/dL (ref 0.80–1.80)

## 2011-08-15 LAB — T3, FREE: T3, Free: 2.3 pg/mL (ref 2.3–4.2)

## 2011-08-15 MED ORDER — DIPHENHYDRAMINE HCL 50 MG PO CAPS
50.0000 mg | ORAL_CAPSULE | ORAL | Status: DC | PRN
  Administered 2011-08-15: 50 mg via ORAL

## 2011-08-15 NOTE — Progress Notes (Signed)
Patient continues to improve with her treatment.  She denies any SI/HI/AVH.  She is feeling overwhelmed with her family situation.  Her son is living in his truck on her property.  The husband does not want the son in the house and the patient to give him any money.  She rates her depression as a 2; hopelessness as a 4.  She attended group therapy this am and participated.  She has been sitting in the day room this morning. She interacts well with staff; she has appropriate behavior.  She is eating and sleeping well.  Continue 15 min safety checks.  Monitor MD orders and medication management.  CBGs monitored before meals and hs.  Patient tolerating medications. Encourage patient to continue to attend group and socialize on unit.  She met with CW and will have a probably discharge in the next 1-2 days.  Lyndee Leo, RN.

## 2011-08-15 NOTE — Discharge Planning (Signed)
Lorraine Turner appears to be in a neutral mood.  States she is here due to "messing with my medications, and I went berserk."  Sees Dr Lovena Le at [facility].  Says she is on Neurontin, Zoloft and Trazodone.  Cites stressors of adult children who are recently homeless.  Volunteers at [employer].  Hopes she is not here long.  C/O racing thoughts.  I passed this info on to Dr Dell Ponto.

## 2011-08-15 NOTE — [facility] Suicide Risk Assessment (Signed)
Suicide Risk Assessment  Admission Assessment     Demographic factors:  Assessment Details Time of Assessment: Admission Information Obtained From: Patient Current Mental Status:  Current Mental Status: Suicidal ideation indicated by patient (endorses si last week) Loss Factors:  Loss Factors: Decrease in vocational status;Decline in physical health;Financial problems / change in socioeconomic status Historical Factors:  Historical Factors: Prior suicide attempts;Family history of mental illness or substance abuse Risk Reduction Factors:  Risk Reduction Factors: Living with another person, especially a relative;Positive social support;Positive therapeutic relationship  CLINICAL FACTORS:   Previous Psychiatric Diagnoses and Treatments Medical Diagnoses and Treatments/Surgeries Schizoaffective Disorder - Depressed Type.  COGNITIVE FEATURES THAT CONTRIBUTE TO RISK:  None Noted.  Diagnosis:  Axis I: Schizoaffective Disorder - Depressive Type.   The patient was seen today and reports the following:   ADL's: Intact.  Sleep: The patient reports to sleeping well without difficulty.  Appetite: The patient reports a good appetite today.   Mild>(1-10) >Severe  Hopelessness (1-10): 0  Depression (1-10): 0  Anxiety (1-10): 0   Suicidal Ideation: The patient denies any suicidal ideations today.  Plan: No  Intent: No  Means: No   Homicidal Ideation: The patient denies any homicidal ideations today.  Plan: No  Intent: No.  Means: No   General Appearance/Behavior: The patient was friendly and cooperative today with this provider.  Eye Contact: Good.  Speech: Appropriate in rate and volume today with no pressuring noted.  Motor Behavior: wnl.  Level of Consciousness: Alert and Oriented x 3.  Mental Status: Alert and Oriented x 3.  Mood: Essentially euthymic today.  Affect: Appears bright and full.  Anxiety Level: No anxiety reported today.  Thought Process: wnl.  Thought Content:  The patient denies any auditory or visual hallucinations today. She also denies any delusional thinking.  Perception: wnl.  Judgment: Good.  Insight: Good.  Cognition: Oriented to person, place and time.   Current Medications:    . glyBURIDE  10 mg Oral BID WC   And  . metFORMIN  1,000 mg Oral BID WC  . linagliptin  5 mg Oral Daily  . lisinopril  20 mg Oral Daily  . sertraline  100 mg Oral Daily  . simvastatin  10 mg Oral QHS  . DISCONTD: glyBURIDE-metformin  2 tablet Oral BID WC   Review of Systems:  Neurological: The patient denies any headaches today. She denies any seizures or dizziness.  G.I.: The patient denies any constipation or G.I. Upset today.  Musculoskeletal: The patient denies any musculoskeletal issues today.  Time was spent today discussing with the patient the situation leading to her admission.  The patient states that her oldest son and daughter are homeless and her husband will not allow her to help them.  She states she become overwhelmed yesterday and began to have suicidal ideations with severe hopeless.  Currently the patient states that she is feeling much improved.  She reports to sleeping well last night and reports a good appetite.  The patient denies any significant feelings of sadness, anhedonia or depressed mood and denies any suicidal or homicidal ideations.  She denies any auditory or visual hallucinations today as well as any anxiety symptoms.  She also denies any anxiety symptoms.  The patient states that as long as she takes her medications she "does well."  She denies any medication related side effects or other concerns today.   Treatment Plan Summary:  1. Daily contact with patient to assess and evaluate symptoms and progress  in treatment.  2. Medication management  3. The patient will deny suicidal ideations or homicidal ideations for 48 hours prior to discharge and have a depression and anxiety rating of 3 or less. The patient will also deny any  auditory or visual hallucinations or delusional thinking.  4. The patient will deny any symptoms of substance withdrawal at time of discharge.   Plan:  1. Will continue the medication Zoloft at 100 mgs po q am for depression and anxiety. 2. Will continue the medication Trazodone at 50 mgs po qhs - prn for sleep. 3. Will continue the patient on her non-psychiatric medications.  4. Laboratory studies reviewed.  5. Will continue to monitor blood sugars TID-WC and hs. 6. Will continue to monitor.   SUICIDE RISK:   Minimal: No identifiable suicidal ideation.  Patients presenting with no risk factors but with morbid ruminations; may be classified as minimal risk based on the severity of the depressive symptoms  Lorraine Turner 08/15/2011, 12:02 PM

## 2011-08-16 MED ORDER — SERTRALINE HCL 100 MG PO TABS: 100.0000 mg | ORAL_TABLET | Freq: Every day | ORAL | Status: DC

## 2011-08-16 MED ORDER — LISINOPRIL 20 MG PO TABS: 20.0000 mg | ORAL_TABLET | Freq: Every day | ORAL | Status: DC

## 2011-08-16 MED ORDER — TRIAMCINOLONE ACETONIDE 0.1 % EX OINT
TOPICAL_OINTMENT | Freq: Two times a day (BID) | CUTANEOUS | Status: DC
  Filled 2011-08-16: qty 15

## 2011-08-16 MED ORDER — GLYBURIDE-METFORMIN 5-500 MG PO TABS: 2.0000 | ORAL_TABLET | Freq: Two times a day (BID) | ORAL | Status: DC

## 2011-08-16 MED ORDER — SITAGLIPTIN PHOSPHATE 100 MG PO TABS: 100.0000 mg | ORAL_TABLET | Freq: Every day | ORAL | Status: DC

## 2011-08-16 MED ORDER — TRIAMCINOLONE ACETONIDE 0.1 % EX OINT: TOPICAL_OINTMENT | Freq: Two times a day (BID) | CUTANEOUS | Status: DC

## 2011-08-16 MED ORDER — TRIAMCINOLONE ACETONIDE 0.1 % EX CREA
TOPICAL_CREAM | Freq: Two times a day (BID) | CUTANEOUS | Status: DC
  Filled 2011-08-16: qty 15

## 2011-08-16 MED ORDER — TRAZODONE HCL 50 MG PO TABS: 50.0000 mg | ORAL_TABLET | Freq: Every evening | ORAL | Status: DC | PRN

## 2011-08-16 MED ORDER — ATORVASTATIN CALCIUM 10 MG PO TABS: 10.0000 mg | ORAL_TABLET | Freq: Every day | ORAL | Status: DC

## 2011-08-16 NOTE — Progress Notes (Signed)
[place] Group Notes:  (Counselor/Nursing/MHT/Case Management/Adjunct)  08/16/2011 2:06 PM  Type of Therapy:  Group Therapy  Participation Level:  Active  Participation Quality:  Appropriate  Affect:  Appropriate  Cognitive:  Oriented  Insight:  Good  Engagement in Group:  Good  Engagement in Therapy:  Good  Modes of Intervention:  Clarification, Education, Problem-solving and Support  Summary of Progress/Problems: Patient was active in discussion of Relapse Prevention. She stated that she needed to listen to others and to also recognize signs like when she becomes irritable and doesn't want to do things.    Tarren Sabree, Caren Griffins 08/16/2011, 2:06 PM

## 2011-08-16 NOTE — BHH Counselor (Signed)
Adult Comprehensive Assessment  Patient ID: Lorraine Turner, female   DOB: Nov 10, 1960, 51 y.o.   MRN: [id number]  Information Source: Information source: Patient  Current Stressors:  Educational / Learning stressors: no issues reported Employment / Job issues: unemployed Family Relationships: argument with husband precipitated admission Financial / Lack of resources (include bankruptcy): was denied disability Housing / Lack of housing: doesn't like her neighborhood Physical health (include injuries & life threatening diseases): diabetes, high blood pressure Social relationships: according to patient she has no friends Substance abuse:  none reported currently Bereavement / Loss: 2007 -mother  Living/Environment/Situation:  Living Arrangements: Spouse/significant other Living conditions (as described by patient or guardian): likes her home but doesn't like the neighborhood How long has patient lived in current situation?: 4 years and 5 months What is atmosphere in current home: Comfortable  Family History:  Marital status: Married Number of Years Married: 50  What types of issues is patient dealing with in the relationship?: financial issues, argument about helping 2 children who are homeless Does patient have children?: Yes How many children?: 5  (3 girls ages 27,13,10 and 2 boys ages 36,19) How is patient's relationship with their children?: perfect  Childhood History:  By whom was/is the patient raised?: Both parents Description of patient's relationship with caregiver when they were a child: good, nurturing Patient's description of current relationship with people who raised him/her: both deceased, father in 76 and mother in 2007 Does patient have siblings?: No Did patient suffer any verbal/emotional/physical/sexual abuse as a child?: No Did patient suffer from severe childhood neglect?: No Has patient ever been sexually abused/assaulted/raped as an adolescent or adult?:  No Was the patient ever a victim of a crime or a disaster?: No Witnessed domestic violence?: No  Education:  Highest grade of school patient has completed: graduated high school 2 years 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  Currently a student?: No Learning disability?: No  Employment/Work Situation:   Employment situation: Unemployed (has been denied disability) Patient's job has been impacted by current illness: No What is the longest time patient has a held a job?: 9 years Where was the patient employed at that time?: [employer] as Museum/gallery conservator, server and dish room supervisor Has patient ever been in the [employer]?: No Has patient ever served in combat?: No  Financial Resources:   Museum/gallery curator resources: Medicaid;Food stamps;Receives SSI (receives husband's SSI, children's medicaid) Does patient have a representative payee or guardian?: No  Alcohol/Substance Abuse:   What has been your use of drugs/alcohol within the last 12 months?: no current use, 1998-drank beer daily but stopped If attempted suicide, did drugs/alcohol play a role in this?: No Alcohol/Substance Abuse Treatment Hx: Denies past history Has alcohol/substance abuse ever caused legal problems?: No  Social Support System:   Pensions consultant Support System: Good Describe Community Support System: husband, children Type of faith/religion: Mormon How does patient's faith help to cope with current illness?: it helps me understand what I'm going through and gives me direction and hope  Leisure/Recreation:   Leisure and Hobbies: crochet, reading [employer]  Strengths/Needs:   What things does the patient do well?: my personality, outgoing, friendly, willing to learn new things, I try to help other people In what areas does patient struggle / problems for patient: pre-judge people wrongly, procrastination, doesn't adjust to changes  Discharge Plan:   Does patient have access to  transportation?: Yes (husband) Will patient be returning to same living situation after discharge?: Yes Currently  receiving community mental health services: Yes (From Whom) [facility], [facility] [employer]) Does patient have financial barriers related to discharge medications?: No  Summary/Recommendations:   Summary and Recommendations (to be completed by the evaluator): Patient is a  51 year old African American female with diagnosis of Schizoaffective D/O. She was admitted with suicidal ideation and attempt by jumping in front of a fire truck preciptated by an argument wtih her husband about wanting to help her 2 children who have been homeless for 6 months. Patient would benefit from crisis stabilization, medication evaluation, group therapy and psycho-education groups to work on coping skills, case management for discharge planning and counselor to contact family for suicide prevention infromation and discharge planning.  Lorraine Turner. 08/16/2011

## 2011-08-16 NOTE — Progress Notes (Signed)
Pt. C/o itching rash on back, assessed, noted small area on back with red bumps in a circular area. Received order from N.  Mashburn, PA  For Benadryl 50mg  q4hrs prn. Writer administered appropriately.(see MAR)

## 2011-08-16 NOTE — Progress Notes (Signed)
Patient ID: Lorraine Turner, female   DOB: 1960/06/06, 51 y.o.   MRN: [id number] D: Pt. Showered and ready for karaoke, denies AVH, Pt reports "feel real good, feel better, mentally and physically." Pt. Denies SHI. A: Pt. Encouraged to sing, enjoy karaoke, encouraged to verbalize when hearing voices.  Pt. To be monitored q73min for safety. R: Pt. Sang in [place], stills sings when she returns, Pt. Is safe on the unit.

## 2011-08-16 NOTE — Progress Notes (Signed)
[facility] Adult Inpatient Family/Significant Other Suicide Prevention Education  Suicide Prevention Education:  Education Completed; Duard Brady, husband [phone]) has been identified by the patient as the family member/significant other with whom the patient will be residing, and identified as the person(s) who will aid the patient in the event of a mental health crisis (suicidal ideations/suicide attempt).  With written consent from the patient, the family member/significant other has been provided the following suicide prevention education, prior to the and/or following the discharge of the patient.  The suicide prevention education provided includes the following:  Suicide risk factors  Suicide prevention and interventions  National Suicide Hotline telephone number  [place] assessment telephone number  [facility] Emergency Assistance [place] and/or Residential Mobile Crisis Unit telephone number  Request made of family/significant other to:  Remove weapons (e.g., guns, rifles, knives), all items previously/currently identified as safety concern.    Remove drugs/medications (over-the-counter, prescriptions, illicit drugs), all items previously/currently identified as a safety concern.  The family member/significant other verbalizes understanding of the suicide prevention education information provided.  The family member/significant other agrees to remove the items of safety concern listed above. Talked with patient's husband. He had no concerns. He stated that she will be fine. No access to weapons.  HartisCaren Griffins 08/16/2011, 12:48 PM

## 2011-08-16 NOTE — Progress Notes (Signed)
Patient ID: Lorraine Turner, female   DOB: 07/10/1960, 51 y.o.   MRN: [id number] Discharge note- Patient is calm and bright during d/c process.  Reviewed all f/u appointments. Reviewed all medication information and sample meds given with instruction. CHO dietary suggestions offered.  Denies SI/HI.  No A/V hallucinations. All belongings returned and escorted to lobby to care of family.

## 2011-08-16 NOTE — BHH Suicide Risk Assessment (Signed)
Suicide Risk Assessment  Discharge Assessment     Demographic factors:  Low socioeconomic status;Unemployed  Current Mental Status Per Nursing Assessment::   On Admission:  Suicidal ideation indicated by patient (endorses si last week) At Discharge:  The patient is alert and oriented x 3. She remains friendly and cooperative with this provider. The patient reports to sleeping well without difficulty and reports a good appetite this morning. The patient denies any significant feelings of sadness, anhedonia or depressed mood and adamantly denies any suicidal or homicidal ideations. The patient also denies any anxiety symptoms as well as any auditory or visual hallucinations or delusional thinking. She denies any medication side effects and requests discharge today.  Current Mental Status Per Physician:  Diagnosis:  Axis I: Schizoaffective Disorder - Depressive Type.   The patient was seen today and reports the following:   ADL's: Intact.  Sleep: The patient reports to sleeping well without difficulty.  Appetite: The patient reports a good appetite today.   Mild>(1-10) >Severe  Hopelessness (1-10): 0  Depression (1-10): 0  Anxiety (1-10): 0   Suicidal Ideation: The patient adamantly denies any suicidal ideations today.  Plan: No  Intent: No  Means: No   Homicidal Ideation: The patient adamantly denies any homicidal ideations today.  Plan: No  Intent: No.  Means: No   General Appearance/Behavior: The patient was friendly and cooperative today with this provider.  Eye Contact: Good.  Speech: Appropriate in rate and volume today with no pressuring noted.  Motor Behavior: wnl.  Level of Consciousness: Alert and Oriented x 3.  Mental Status: Alert and Oriented x 3.  Mood: Remains euthymic today.  Affect: Remains bright and full.  Anxiety Level: No anxiety reported today.  Thought Process: wnl.  Thought Content: The patient denies any auditory or visual hallucinations today. She  also denies any delusional thinking.  Perception: wnl.  Judgment: Good.  Insight: Good.  Cognition: Oriented to person, place and time.   Loss Factors: Decrease in vocational status;Decline in physical health;Financial problems / change in socioeconomic status  Historical Factors: Prior suicide attempts;Family history of mental illness or substance abuse  Risk Reduction Factors:   Good insight into mental illness.  Good family support.  Good access to healthcare.  Continued Clinical Symptoms:  Previous Psychiatric Diagnoses and Treatments Medical Diagnoses and Treatments/Surgeries Schizoaffective Disorder - Depressive Type.  Discharge Diagnoses:   AXIS I:   Schizoaffective Disorder - Depressive Type.  AXIS II:   Deferred. AXIS III:  1.  Hypertension.   2.  Diabetes Mellitus. AXIS IV:   Chronic Mental Illness.  Chronic Non-psychiatric Illnesses.  Family Conflict. AXIS V:   GAF at time of admission approximately 35.  GAF at time of discharge approximately 55.  Cognitive Features That Contribute To Risk:  None Noted.    Current Medications:   .  glyBURIDE  10 mg  Oral  BID WC    And   .  metFORMIN  1,000 mg  Oral  BID WC   .  linagliptin  5 mg  Oral  Daily   .  lisinopril  20 mg  Oral  Daily   .  sertraline  100 mg  Oral  Daily   .  simvastatin  10 mg  Oral  QHS   .  DISCONTD: glyBURIDE-metformin  2 tablet  Oral  BID WC    Review of Systems:  Neurological: The patient denies any headaches today. She denies any seizures or dizziness.  G.I.: The  patient denies any constipation or G.I. Upset today.  Musculoskeletal: The patient denies any musculoskeletal issues today.   Time was spent today discussing with the patient her current symptoms.  The patient is alert and oriented x 3. She remains friendly and cooperative with this provider. The patient reports to sleeping well without difficulty and reports a good appetite this morning. The patient denies any significant feelings  of sadness, anhedonia or depressed mood and adamantly denies any suicidal or homicidal ideations. The patient also denies any anxiety symptoms as well as any auditory or visual hallucinations or delusional thinking. She denies any medication side effects and requests discharge today.   Treatment Plan Summary:  1. Daily contact with patient to assess and evaluate symptoms and progress in treatment.  2. Medication management  3. The patient will deny suicidal ideations or homicidal ideations for 48 hours prior to discharge and have a depression and anxiety rating of 3 or less. The patient will also deny any auditory or visual hallucinations or delusional thinking.  4. The patient will deny any symptoms of substance withdrawal at time of discharge.   Plan:  1. Will continue the medication Zoloft at 100 mgs po q am for depression and anxiety.  2. Will continue the medication Trazodone at 50 mgs po qhs - prn for sleep.  3. Will continue the patient on her non-psychiatric medications.  4. Laboratory studies reviewed.  5. Will continue to monitor blood sugars TID-WC and hs.  6. Will continue to monitor.  7. Will discharge today to outpatient follow up at the patient's request.  Suicide Risk:  Minimal: No identifiable suicidal ideation.  Patients presenting with no risk factors but with morbid ruminations; may be classified as minimal risk based on the severity of the depressive symptoms  Plan Of Care/Follow-up recommendations:  Activity:  As tolerated. Diet:  Diabetic Diet. Other:  Please take all medications only as directed and keep all scheduled follow up appointments.  Keenan Trefry 08/16/2011, 11:44 AM

## 2011-08-16 NOTE — Progress Notes (Signed)
Met with patient in Aftercare Planning Group and provided today's workbook based on Relapse Prevention. Patient was pleasant, smiling, and interacted well with group. During Aftercare Planning Group, Case Manager provided psychoeducation on "Suicide Prevention Information." This included descriptions of risk factors for suicide, warning signs that an individual is in crisis and thinking of suicide, and what to do if this occurs. Pt indicated understanding of information provided, and will read brochure given upon discharge. Patient participated actively in the discussion and gave pertinent feedback. Patient will return to live with husband and two children. She has access to medications through [facility] and has transportation. Denies SI/HI or psychosis. She verbalized importance of staying on medications even when she feels well. She will follow-up with [facility] hospital discharge clinic on Tuesday 7/9 @ 8 AM and with Purcell Nails LCSW at [facility] on 08/19/11 @ 2 PM. No further case management needs voiced. Foster Simpson RN MS EdS 08/16/2011  11:51 AM

## 2011-08-16 NOTE — Tx Team (Signed)
Interdisciplinary Treatment Plan Update (Adult)  Date: 08/16/2011 Time Reviewed: 1015  Progress in Treatment: Attending groups: Yes Participating in groups:  Yes Taking medication as prescribed: Yes Tolerating medication:  Yes Family/Significant othe contact made:   Patient understands diagnosis:  Yes Discussing patient identified problems/goals with staff:  Yes Medical problems stabilized or resolved:  Yes Denies suicidal/homicidal ideation: Yes Issues/concerns per patient self-inventory:  None identified Other: N/A  New problem(s) identified: None Identified  Reason for Continuation of Hospitalization: Discharging Today    Interventions implemented related to continuation of hospitalization: mood stabilization, medication monitoring and adjustment, group therapy and psycho education, safety checks q 15 mins  Additional comments: N/A  Estimated length of stay: Discharge Today  Discharge Plan: Purcell Nails LCSW at [facility] on 07/20/11 @ 2 pm and [facility] hospital f/u walk-in clinic on 08/20/11 @ 8 AM.   Review of initial/current patient goals per problem list:   1.  Goal(s): Reduce depressive symptoms  Met:  Yes  Target date: by discharge  As evidenced by: Rates depression at 0.  2.  Goal (s): Eliminate Suicidal Ideation  Met:  Yes  Target date: by discharge  As evidenced by: Eliminate suicidal ideation.   3.  Goal(s): Reduce Psychosis  Met:  Yes  Target date: by discharge  As evidenced by: Patient and family reports baseline met.      Attendees: Patient:  Lorraine Turner   Family:     Physician:  Carloyn Jaeger, MD 08/16/2011 1015   Nursing:      Case Manager:  Arna Snipe RN [place] 08/16/2011 1015  Counselor:  Leandrew Koyanagi, MT-BC 08/16/2011  1015   Other:  Clinton Sawyer RN 08/16/2011 1015   Other:     Other:     Other:      Scribe for Treatment Team:   Arna Snipe 08/16/2011 1015

## 2011-08-16 NOTE — Discharge Summary (Signed)
Physician Discharge Summary Note  Patient:  Lorraine Turner is an 51 y.o., female MRN:  [id number] DOB:  1960/05/30 Patient phone:  [phone] (home)  Patient address:   [address] [place] [address],   Date of Admission:  08/14/2011 Date of Discharge: 08/16/2011  Reason for Admission: increase in symptoms  Discharge Diagnoses: Principal Problem:  *Noncompliance with medication regimen Active Problems:  Schizoaffective disorder, depressive type   Axis Diagnosis:  Discharge Diagnoses:  AXIS I: Schizoaffective Disorder - Depressive Type.  AXIS II: Deferred.  AXIS III: 1. Hypertension.  2. Diabetes Mellitus.  AXIS IV: Chronic Mental Illness. Chronic Non-psychiatric Illnesses. Family Conflict.  AXIS V: GAF at time of admission approximately 35. GAF at time of discharge approximately 55.   Level of Care:  OP  Hospital Course:  The patient was admitted for crisis management and stabilization after she discontinued her medications 4 months previously due to lack of finances and ability to get her medications.  She notes that she and her husband got into a row after he refused to help her son who is 34 years old who is homeless.  Her previous medications were restarted and her health issues were evaluated and treated as needed.  The patient noted a good reduction in symptoms as her medications were restarted and she felt ready for discharge. The patient was discharged home in much improved condition than upon her admission and was instructed to follow up with the outpatient clinic at [facility].  Consults:  None  Significant Diagnostic Studies:  None  Discharge Vitals:   Blood pressure 129/66, pulse 58, temperature 98 F (36.7 C), temperature source Oral, resp. rate 16, height 5\' 2"  (1.575 m), weight 71.668 kg (158 lb).  Mental Status Exam: See Mental Status Examination and Suicide Risk Assessment completed by Attending Physician prior to discharge.  Discharge destination:   Home Is patient on multiple antipsychotic therapies at discharge:  No   Has Patient had three or more failed trials of antipsychotic monotherapy by history:  No Recommended Plan for Multiple Antipsychotic Therapies: not applicable   = Discharge Orders    Future Orders Please Complete By Expires   Diet - low sodium heart healthy      Increase activity slowly      Discharge instructions      Comments:   Take all of your medications as prescribed.  Be sure to keep ALL follow up appointments as scheduled. This is to ensure getting your refills on time to avoid any interruption in your medication.  If you find that you can not keep your appointment, call the clinic and reschedule. Be sure to tell the nurse if you will need a refill before your appointment.     Medication List  As of 08/16/2011 11:37 AM   STOP taking these medications         pravastatin 20 MG tablet         TAKE these medications      Indication    atorvastatin 10 MG tablet   Commonly known as: LIPITOR   Take 1 tablet (10 mg total) by mouth daily. For hyperlipidemia.       glyBURIDE-metformin 5-500 MG per tablet   Commonly known as: GLUCOVANCE   Take 2 tablets by mouth 2 (two) times daily. For hyperlipidemia.       lisinopril 20 MG tablet   Commonly known as: PRINIVIL,ZESTRIL   Take 1 tablet (20 mg total) by mouth daily. For hypertension.  Indication: High Blood Pressure      sertraline 100 MG tablet   Commonly known as: ZOLOFT   Take 1 tablet (100 mg total) by mouth daily. For anxiety and depression.    Indication: Anxiety Disorder, Major Depressive Disorder      sitaGLIPtin 100 MG tablet   Commonly known as: JANUVIA   Take 1 tablet (100 mg total) by mouth daily. For hyperglycemia.    Indication: Type 2 Diabetes      traZODone 50 MG tablet   Commonly known as: DESYREL   Take 1 tablet (50 mg total) by mouth at bedtime as needed and may repeat dose one time if needed for sleep. For insomnia.    Indication:  Trouble Sleeping      triamcinolone ointment 0.1 %   Commonly known as: KENALOG   Apply topically 2 (two) times daily. For contact dermatitis.              Follow-up recommendations:  Eat a heart healthy diet.  Exercise daily as tolerated. Get plenty of rest.    Comments:    Signed: Milta Deiters T. Juelz Claar PAC For Dr. Louie Casa D. Read[place] 08/16/2011, 11:37 AM

## 2011-08-16 NOTE — Progress Notes (Signed)
Psychoeducational Group Note  Date:  08/16/2011 Time:  1100  Group Topic/Focus:  Relapse Prevention Planning:   The focus of this group is to define relapse and discuss the need for planning to combat relapse.  Participation Level:  Active  Participation Quality:  Attentive  Affect:  Appropriate  Cognitive:  Appropriate  Insight:  Good  Engagement in Group:  Good  Additional Comments:  Pt. Fully participated in group this morning. Pt. Was able to share a lot of experiences with the other pts that attended group  [place], [place] 08/16/2011, 1:03 PM

## 2011-08-16 NOTE — Progress Notes (Signed)
D-Pt is bright and appropriate this shift.  Is attending groups and interacting with peers. A-Rates depression and hopelessness at 0. Denies SI. Continues to c/o itching on her back. Assessment reveals a 2cm-3cm area with multiple pinpoint raised areas. No drainage or edema and pt statest itching is intermittent and relieved by po prn medication.  Reports no problems with sleep,appetite or energy. R- Support,encouragemnt and positive feedback given. Continue current POC with evaluation of goals for treatment.  Continue 15' checks for safety.

## 2011-08-20 NOTE — Progress Notes (Signed)
Patient Discharge Instructions:  After Visit Summary (AVS):   Faxed to:  08/19/2011 Psychiatric Admission Assessment Note:   Faxed to:  08/19/2011 Suicide Risk Assessment - Discharge Assessment:   Faxed to:  08/19/2011 Faxed/Sent to the Next Level Care provider:  08/19/2011  Faxed to [facility] @ [phone] And to [facility] [place] @ [phone]  Jola Baptist, 08/20/2011, 3:34 PM

## 2012-05-09 ENCOUNTER — Emergency Department (HOSPITAL_COMMUNITY): Payer: Medicaid Other

## 2012-05-09 ENCOUNTER — Encounter (HOSPITAL_COMMUNITY): Payer: Self-pay | Admitting: Emergency Medicine

## 2012-05-09 ENCOUNTER — Emergency Department (HOSPITAL_COMMUNITY)
Admission: EM | Admit: 2012-05-09 | Discharge: 2012-05-09 | Disposition: A | Discharge status: Home / Self Care | Payer: Medicaid Other | Attending: Emergency Medicine | Admitting: Emergency Medicine

## 2012-05-09 DIAGNOSIS — Y9229 Other specified public building as the place of occurrence of the external cause: Secondary | ICD-10-CM | POA: Insufficient documentation

## 2012-05-09 DIAGNOSIS — W010XXA Fall on same level from slipping, tripping and stumbling without subsequent striking against object, initial encounter: Secondary | ICD-10-CM | POA: Insufficient documentation

## 2012-05-09 DIAGNOSIS — Y9301 Activity, walking, marching and hiking: Secondary | ICD-10-CM | POA: Insufficient documentation

## 2012-05-09 DIAGNOSIS — IMO0002 Reserved for concepts with insufficient information to code with codable children: Secondary | ICD-10-CM | POA: Insufficient documentation

## 2012-05-09 DIAGNOSIS — E119 Type 2 diabetes mellitus without complications: Secondary | ICD-10-CM | POA: Insufficient documentation

## 2012-05-09 DIAGNOSIS — R51 Headache: Secondary | ICD-10-CM

## 2012-05-09 DIAGNOSIS — I10 Essential (primary) hypertension: Secondary | ICD-10-CM | POA: Insufficient documentation

## 2012-05-09 DIAGNOSIS — F209 Schizophrenia, unspecified: Secondary | ICD-10-CM | POA: Insufficient documentation

## 2012-05-09 DIAGNOSIS — F319 Bipolar disorder, unspecified: Secondary | ICD-10-CM | POA: Insufficient documentation

## 2012-05-09 DIAGNOSIS — S060X9A Concussion with loss of consciousness of unspecified duration, initial encounter: Secondary | ICD-10-CM | POA: Insufficient documentation

## 2012-05-09 DIAGNOSIS — F411 Generalized anxiety disorder: Secondary | ICD-10-CM | POA: Insufficient documentation

## 2012-05-09 DIAGNOSIS — W19XXXA Unspecified fall, initial encounter: Secondary | ICD-10-CM

## 2012-05-09 DIAGNOSIS — Z79899 Other long term (current) drug therapy: Secondary | ICD-10-CM | POA: Insufficient documentation

## 2012-05-09 DIAGNOSIS — M549 Dorsalgia, unspecified: Secondary | ICD-10-CM

## 2012-05-09 MED ORDER — IBUPROFEN 400 MG PO TABS
400.0000 mg | ORAL_TABLET | Freq: Once | ORAL | Status: AC
  Administered 2012-05-09: 400 mg via ORAL
  Filled 2012-05-09: qty 1

## 2012-05-09 NOTE — ED Notes (Addendum)
Patient presents to ED via EMS with complaints of fall. Patient fell walking down sidewalk in front of [employer]. Patient states she thinks she slipped on water. Patient states she hit the back of her head. No LOC per patient per EMS. CBG with  EMS 238.

## 2012-05-09 NOTE — ED Notes (Signed)
Pt ambulated to the bathroom. Tolerated well.

## 2012-05-09 NOTE — ED Provider Notes (Signed)
History     CSN: [id number]  Arrival date & time 05/09/12  1720   First MD Initiated Contact with Patient 05/09/12 1739     Chief Complaint  Patient presents with  . Fall   HPI  52 y/o female who presents via EMS after fall. The patient was walking in a store when she slipped on water and fell backwards on her head. She denies loss of consciousness. She states she is having head and back pain. She states her pain is currently a 6/10. She denies any modifying factors.   Past Medical History  Diagnosis Date  . Hypertension   . Diabetes mellitus   . Bipolar 1 disorder   . Schizophrenia   . Anxiety   . Depression     History reviewed. No pertinent past surgical history.  History reviewed. No pertinent family history.  History  Substance Use Topics  . Smoking status: Never Smoker   . Smokeless tobacco: Not on file  . Alcohol Use: No    OB History   Grav Para Term Preterm Abortions TAB SAB Ect Mult Living                 Review of Systems  Constitutional: Negative for fever and chills.  Gastrointestinal: Negative for nausea and vomiting.  Musculoskeletal: Positive for back pain.  Neurological: Positive for headaches. Negative for weakness and numbness.  All other systems reviewed and are negative.   Allergies  Review of patient's allergies indicates no known allergies.  Home Medications   Current Outpatient Rx  Name  Route  Sig  Dispense  Refill  . busPIRone (BUSPAR) 10 MG tablet   Oral   Take 10 mg by mouth 3 (three) times daily.         Marland Kitchen glyBURIDE-metformin (GLUCOVANCE) 5-500 MG per tablet   Oral   Take 2 tablets by mouth 2 (two) times daily. For hyperglycemia.   60 tablet   0   . Multiple Vitamin (MULTIVITAMIN WITH MINERALS) TABS   Oral   Take 1 tablet by mouth every other day.         . sertraline (ZOLOFT) 100 MG tablet   Oral   Take 1 tablet (100 mg total) by mouth daily. For anxiety and depression.   30 tablet   0   . sitaGLIPtin  (JANUVIA) 100 MG tablet   Oral   Take 1 tablet (100 mg total) by mouth daily. For hyperglycemia.   30 tablet   0     BP 138/57  Pulse 70  Temp(Src) 99 F (37.2 C) (Oral)  Resp 16  SpO2 98%  Physical Exam  Nursing note and vitals reviewed. Constitutional: She appears well-developed and well-nourished. No distress.  HENT:  Head: Normocephalic and atraumatic.  Right Ear: Tympanic membrane normal.  Left Ear: Tympanic membrane normal.  Nose: Nose normal.  Mouth/Throat: Uvula is midline and mucous membranes are normal. No oropharyngeal exudate.  Neck: Normal range of motion. Neck supple.  Cervical collar in place  Cardiovascular: Normal rate and regular rhythm.  Exam reveals no gallop and no friction rub.   No murmur heard. Pulmonary/Chest: Effort normal and breath sounds normal.  Abdominal: Soft. She exhibits no distension. There is no tenderness.  Musculoskeletal: Normal range of motion. She exhibits no edema and no tenderness.       Thoracic back: She exhibits no tenderness and no bony tenderness.       Lumbar back: She exhibits bony tenderness.  Neurological: She  is alert. She has normal strength. No cranial nerve deficit or sensory deficit.  AAO to person and place. No oriented to year/day/month  Skin: Skin is warm and dry.  Psychiatric: She has a normal mood and affect.    ED Course  Procedures (including critical care time)  Labs Reviewed - No data to display Dg Lumbar Spine 2-3 Views  05/09/2012  *RADIOLOGY REPORT*  Clinical Data: Fall, low back pain.  LUMBAR SPINE - 2-3 VIEW  Comparison: None.  Findings: Normal alignment.  No fracture.  Disc spaces are maintained.  Mild sclerosis around the SI joints bilaterally suggesting sacroiliitis.  Sclerosis around the pubic symphysis as well.  IMPRESSION: No acute bony abnormality.  Bilateral sacroiliitis and osteitis pubis.   Original Report Authenticated By: Rolm Baptise, M.D.    Ct Head Wo Contrast  05/09/2012  *RADIOLOGY  REPORT*  Clinical Data:  Fall.  Trauma to back of head.  Posterior headache. The  CT HEAD WITHOUT CONTRAST CT CERVICAL SPINE WITHOUT CONTRAST  Technique:  Multidetector CT imaging of the head and cervical spine was performed following the standard protocol without intravenous contrast.  Multiplanar CT image reconstructions of the cervical spine were also generated.  Comparison:   None  CT HEAD  Findings: No acute cortical infarct, hemorrhage, or mass lesion is present.  Ventricles are normal size.  No significant extra-axial fluid collection is present.  The paranasal sinuses and mastoid air cells are clear.  The osseous skull is intact.  No significant extra-axial fluid collection is present.  IMPRESSION: Negative CT of the head.  CT CERVICAL SPINE  Findings: Cervical spine is imaged from skull base through T2-3. The vertebral body heights and alignment maintained.  Minimal uncovertebral disease is evident.  No acute fracture or traumatic subluxation is evident.  The soft tissues are unremarkable.  The lung apices are clear.  IMPRESSION: Negative CT of the cervical spine.   Original Report Authenticated By: San Morelle, M.D.    Ct Cervical Spine Wo Contrast  05/09/2012  *RADIOLOGY REPORT*  Clinical Data:  Fall.  Trauma to back of head.  Posterior headache. The  CT HEAD WITHOUT CONTRAST CT CERVICAL SPINE WITHOUT CONTRAST  Technique:  Multidetector CT imaging of the head and cervical spine was performed following the standard protocol without intravenous contrast.  Multiplanar CT image reconstructions of the cervical spine were also generated.  Comparison:   None  CT HEAD  Findings: No acute cortical infarct, hemorrhage, or mass lesion is present.  Ventricles are normal size.  No significant extra-axial fluid collection is present.  The paranasal sinuses and mastoid air cells are clear.  The osseous skull is intact.  No significant extra-axial fluid collection is present.  IMPRESSION: Negative CT of the  head.  CT CERVICAL SPINE  Findings: Cervical spine is imaged from skull base through T2-3. The vertebral body heights and alignment maintained.  Minimal uncovertebral disease is evident.  No acute fracture or traumatic subluxation is evident.  The soft tissues are unremarkable.  The lung apices are clear.  IMPRESSION: Negative CT of the cervical spine.   Original Report Authenticated By: San Morelle, M.D.     1. Fall, initial encounter   2. Headache   3. Concussion, with loss of consciousness of unspecified duration, initial encounter   4. Back pain     MDM  52 y/o female who presents via EMS after mechanical fall. HDS. AAOX2 on exam which is different from baseline per husband. Concern for ICH. Will obtain  CT head, C-spine, and XR lumbar spine.    12:00 AM- Imaging negative. Pt feeling better. AAOX3. Ibuprofen given for headache. Will ambulate and if she tolerates will discharge to home.         Donita Brooks, MD 05/10/12 0001

## 2012-05-11 NOTE — ED Provider Notes (Signed)
I performed a history and physical examination of  Lorraine Turner and discussed her management with Dr. Jamse Arn. I agree with the history, physical, assessment, and plan of care, with the following exceptions: None I was present for the following procedures: None  Time Spent in Critical Care of the patient: None  Time spent in discussions with the patient and family: 5-10 minutes.  Pt had a mechanical fall. No red flags for head injury of cspine injury.  Bernardino Dowell  Varney Biles, MD 05/11/12 [phone]

## 2012-11-30 ENCOUNTER — Emergency Department (HOSPITAL_COMMUNITY)
Admission: EM | Admit: 2012-11-30 | Discharge: 2012-11-30 | Disposition: A | Discharge status: Home / Self Care | Payer: Medicaid Other | Attending: Emergency Medicine | Admitting: Emergency Medicine

## 2012-11-30 DIAGNOSIS — Z79899 Other long term (current) drug therapy: Secondary | ICD-10-CM | POA: Insufficient documentation

## 2012-11-30 DIAGNOSIS — Z91199 Patient's noncompliance with other medical treatment and regimen due to unspecified reason: Secondary | ICD-10-CM | POA: Insufficient documentation

## 2012-11-30 DIAGNOSIS — F411 Generalized anxiety disorder: Secondary | ICD-10-CM | POA: Insufficient documentation

## 2012-11-30 DIAGNOSIS — Z9114 Patient's other noncompliance with medication regimen: Secondary | ICD-10-CM

## 2012-11-30 DIAGNOSIS — Z91148 Patient's other noncompliance with medication regimen for other reason: Secondary | ICD-10-CM

## 2012-11-30 DIAGNOSIS — F1010 Alcohol abuse, uncomplicated: Secondary | ICD-10-CM

## 2012-11-30 DIAGNOSIS — Z8659 Personal history of other mental and behavioral disorders: Secondary | ICD-10-CM | POA: Insufficient documentation

## 2012-11-30 DIAGNOSIS — R739 Hyperglycemia, unspecified: Secondary | ICD-10-CM

## 2012-11-30 DIAGNOSIS — N390 Urinary tract infection, site not specified: Secondary | ICD-10-CM

## 2012-11-30 DIAGNOSIS — F319 Bipolar disorder, unspecified: Secondary | ICD-10-CM | POA: Insufficient documentation

## 2012-11-30 DIAGNOSIS — E119 Type 2 diabetes mellitus without complications: Secondary | ICD-10-CM | POA: Insufficient documentation

## 2012-11-30 DIAGNOSIS — Z9119 Patient's noncompliance with other medical treatment and regimen: Secondary | ICD-10-CM | POA: Insufficient documentation

## 2012-11-30 DIAGNOSIS — I10 Essential (primary) hypertension: Secondary | ICD-10-CM | POA: Insufficient documentation

## 2012-11-30 LAB — CBC WITH DIFFERENTIAL/PLATELET
Eosinophils Relative: 1 % (ref 0–5)
Hemoglobin: 13.5 g/dL (ref 12.0–15.0)
Lymphocytes Relative: 26 % (ref 12–46)
Lymphs Abs: 1.7 10*3/uL (ref 0.7–4.0)
MCV: 88.6 fL (ref 78.0–100.0)
Monocytes Relative: 7 % (ref 3–12)
Platelets: 250 10*3/uL (ref 150–400)
RBC: 4.37 MIL/uL (ref 3.87–5.11)
WBC: 6.7 10*3/uL (ref 4.0–10.5)

## 2012-11-30 LAB — URINE MICROSCOPIC-ADD ON

## 2012-11-30 LAB — COMPREHENSIVE METABOLIC PANEL
ALT: 21 U/L (ref 0–35)
Alkaline Phosphatase: 105 U/L (ref 39–117)
CO2: 26 mEq/L (ref 19–32)
GFR calc Af Amer: 81 mL/min — ABNORMAL LOW (ref >90)
GFR calc non Af Amer: 70 mL/min — ABNORMAL LOW (ref >90)
Glucose, Bld: 360 mg/dL — ABNORMAL HIGH (ref 70–99)
Potassium: 4.4 mEq/L (ref 3.5–5.1)
Sodium: 139 mEq/L (ref 135–145)

## 2012-11-30 LAB — URINALYSIS, ROUTINE W REFLEX MICROSCOPIC
Bilirubin Urine: NEGATIVE
Ketones, ur: 40 mg/dL — AB
Protein, ur: NEGATIVE mg/dL
Urobilinogen, UA: 1 mg/dL (ref 0.0–1.0)

## 2012-11-30 LAB — RAPID URINE DRUG SCREEN, HOSP PERFORMED
Barbiturates: NOT DETECTED
Tetrahydrocannabinol: NOT DETECTED

## 2012-11-30 LAB — GLUCOSE, CAPILLARY: Glucose-Capillary: 374 mg/dL — ABNORMAL HIGH (ref 70–99)

## 2012-11-30 MED ORDER — ONDANSETRON HCL 4 MG/2ML IJ SOLN
4.0000 mg | Freq: Once | INTRAMUSCULAR | Status: AC
  Administered 2012-11-30: 4 mg via INTRAVENOUS
  Filled 2012-11-30: qty 2

## 2012-11-30 MED ORDER — SODIUM CHLORIDE 0.9 % IV SOLN
1000.0000 mL | INTRAVENOUS | Status: DC
  Administered 2012-11-30: 1000 mL via INTRAVENOUS

## 2012-11-30 MED ORDER — CEPHALEXIN 500 MG PO CAPS: 500.0000 mg | ORAL_CAPSULE | Freq: Three times a day (TID) | ORAL | Status: DC

## 2012-11-30 MED ORDER — SERTRALINE HCL 100 MG PO TABS: 100.0000 mg | ORAL_TABLET | Freq: Every day | ORAL | Status: DC

## 2012-11-30 MED ORDER — SODIUM CHLORIDE 0.9 % IV SOLN
1000.0000 mL | Freq: Once | INTRAVENOUS | Status: AC
  Administered 2012-11-30: 1000 mL via INTRAVENOUS

## 2012-11-30 MED ORDER — GLYBURIDE-METFORMIN 5-500 MG PO TABS: 2.0000 | ORAL_TABLET | Freq: Two times a day (BID) | ORAL | Status: DC

## 2012-11-30 MED ORDER — INSULIN ASPART 100 UNIT/ML ~~LOC~~ SOLN
6.0000 [IU] | Freq: Once | SUBCUTANEOUS | Status: AC
  Administered 2012-11-30: 21:00:00 via INTRAVENOUS
  Filled 2012-11-30: qty 1

## 2012-11-30 MED ORDER — BUSPIRONE HCL 10 MG PO TABS: 10.0000 mg | ORAL_TABLET | Freq: Three times a day (TID) | ORAL | Status: DC

## 2012-11-30 MED ORDER — CEPHALEXIN 250 MG PO CAPS
500.0000 mg | ORAL_CAPSULE | Freq: Once | ORAL | Status: AC
  Administered 2012-11-30: 500 mg via ORAL
  Filled 2012-11-30: qty 2

## 2012-11-30 MED ORDER — SITAGLIPTIN PHOSPHATE 100 MG PO TABS: 100.0000 mg | ORAL_TABLET | Freq: Every day | ORAL | Status: DC

## 2012-11-30 NOTE — ED Notes (Signed)
Pt continues to sleep, awakens easily.  Fluids infusing without difficulty.

## 2012-11-30 NOTE — ED Notes (Signed)
Pt stated that last alcohol intake was 11/29/12 at 0130
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
## 2012-12-02 LAB — URINE CULTURE

## 2013-04-04 ENCOUNTER — Encounter (HOSPITAL_COMMUNITY): Payer: Self-pay | Admitting: Emergency Medicine

## 2013-04-04 ENCOUNTER — Emergency Department (HOSPITAL_COMMUNITY)
Admission: EM | Admit: 2013-04-04 | Discharge: 2013-04-04 | Disposition: A | Discharge status: Home / Self Care | Payer: Medicaid Other | Attending: Emergency Medicine | Admitting: Emergency Medicine

## 2013-04-04 DIAGNOSIS — B373 Candidiasis of vulva and vagina: Secondary | ICD-10-CM | POA: Insufficient documentation

## 2013-04-04 DIAGNOSIS — N898 Other specified noninflammatory disorders of vagina: Secondary | ICD-10-CM | POA: Insufficient documentation

## 2013-04-04 DIAGNOSIS — Z9119 Patient's noncompliance with other medical treatment and regimen: Secondary | ICD-10-CM | POA: Insufficient documentation

## 2013-04-04 DIAGNOSIS — Z91199 Patient's noncompliance with other medical treatment and regimen due to unspecified reason: Secondary | ICD-10-CM | POA: Insufficient documentation

## 2013-04-04 DIAGNOSIS — F319 Bipolar disorder, unspecified: Secondary | ICD-10-CM | POA: Insufficient documentation

## 2013-04-04 DIAGNOSIS — I10 Essential (primary) hypertension: Secondary | ICD-10-CM | POA: Insufficient documentation

## 2013-04-04 DIAGNOSIS — Z9114 Patient's other noncompliance with medication regimen: Secondary | ICD-10-CM

## 2013-04-04 DIAGNOSIS — F411 Generalized anxiety disorder: Secondary | ICD-10-CM | POA: Insufficient documentation

## 2013-04-04 DIAGNOSIS — R739 Hyperglycemia, unspecified: Secondary | ICD-10-CM

## 2013-04-04 DIAGNOSIS — F209 Schizophrenia, unspecified: Secondary | ICD-10-CM | POA: Insufficient documentation

## 2013-04-04 DIAGNOSIS — B3731 Acute candidiasis of vulva and vagina: Secondary | ICD-10-CM | POA: Insufficient documentation

## 2013-04-04 DIAGNOSIS — E119 Type 2 diabetes mellitus without complications: Secondary | ICD-10-CM | POA: Insufficient documentation

## 2013-04-04 DIAGNOSIS — F17200 Nicotine dependence, unspecified, uncomplicated: Secondary | ICD-10-CM | POA: Insufficient documentation

## 2013-04-04 DIAGNOSIS — R3589 Other polyuria: Secondary | ICD-10-CM | POA: Insufficient documentation

## 2013-04-04 DIAGNOSIS — B379 Candidiasis, unspecified: Secondary | ICD-10-CM

## 2013-04-04 DIAGNOSIS — Z79899 Other long term (current) drug therapy: Secondary | ICD-10-CM | POA: Insufficient documentation

## 2013-04-04 DIAGNOSIS — R358 Other polyuria: Secondary | ICD-10-CM | POA: Insufficient documentation

## 2013-04-04 DIAGNOSIS — Z792 Long term (current) use of antibiotics: Secondary | ICD-10-CM | POA: Insufficient documentation

## 2013-04-04 DIAGNOSIS — R631 Polydipsia: Secondary | ICD-10-CM | POA: Insufficient documentation

## 2013-04-04 LAB — CBC WITH DIFFERENTIAL/PLATELET
BASOS PCT: 0 % (ref 0–1)
Basophils Absolute: 0 10*3/uL (ref 0.0–0.1)
EOS ABS: 0.1 10*3/uL (ref 0.0–0.7)
Eosinophils Relative: 1 % (ref 0–5)
HCT: 33.9 % — ABNORMAL LOW (ref 36.0–46.0)
Hemoglobin: 11.7 g/dL — ABNORMAL LOW (ref 12.0–15.0)
LYMPHS ABS: 2.4 10*3/uL (ref 0.7–4.0)
Lymphocytes Relative: 36 % (ref 12–46)
MCH: 30.4 pg (ref 26.0–34.0)
MCHC: 34.5 g/dL (ref 30.0–36.0)
MCV: 88.1 fL (ref 78.0–100.0)
Monocytes Absolute: 0.5 10*3/uL (ref 0.1–1.0)
Monocytes Relative: 7 % (ref 3–12)
NEUTROS PCT: 55 % (ref 43–77)
Neutro Abs: 3.6 10*3/uL (ref 1.7–7.7)
PLATELETS: 266 10*3/uL (ref 150–400)
RBC: 3.85 MIL/uL — AB (ref 3.87–5.11)
RDW: 12.8 % (ref 11.5–15.5)
WBC: 6.5 10*3/uL (ref 4.0–10.5)

## 2013-04-04 LAB — BASIC METABOLIC PANEL
BUN: 13 mg/dL (ref 6–23)
CHLORIDE: 100 meq/L (ref 96–112)
CO2: 24 mEq/L (ref 19–32)
Calcium: 8.4 mg/dL (ref 8.4–10.5)
Creatinine, Ser: 0.76 mg/dL (ref 0.50–1.10)
GFR calc Af Amer: >90 mL/min (ref >90)
Glucose, Bld: 446 mg/dL — ABNORMAL HIGH (ref 70–99)
Potassium: 4.1 mEq/L (ref 3.7–5.3)
SODIUM: 136 meq/L — AB (ref 137–147)

## 2013-04-04 LAB — URINALYSIS, ROUTINE W REFLEX MICROSCOPIC
Bilirubin Urine: NEGATIVE
Glucose, UA: >1000 mg/dL — AB
Hgb urine dipstick: NEGATIVE
Ketones, ur: NEGATIVE mg/dL
LEUKOCYTES UA: NEGATIVE
Nitrite: NEGATIVE
Protein, ur: NEGATIVE mg/dL
SPECIFIC GRAVITY, URINE: 1.037 — AB (ref 1.005–1.030)
UROBILINOGEN UA: 0.2 mg/dL (ref 0.0–1.0)
pH: 6 (ref 5.0–8.0)

## 2013-04-04 LAB — URINE MICROSCOPIC-ADD ON

## 2013-04-04 LAB — CBG MONITORING, ED
GLUCOSE-CAPILLARY: 423 mg/dL — AB (ref 70–99)
Glucose-Capillary: 388 mg/dL — ABNORMAL HIGH (ref 70–99)
Glucose-Capillary: 210 mg/dL — ABNORMAL HIGH (ref 70–99)

## 2013-04-04 MED ORDER — SITAGLIPTIN PHOSPHATE 100 MG PO TABS: 100.0000 mg | ORAL_TABLET | Freq: Every day | ORAL | Status: DC

## 2013-04-04 MED ORDER — GLYBURIDE-METFORMIN 5-500 MG PO TABS: 2.0000 | ORAL_TABLET | Freq: Two times a day (BID) | ORAL | Status: DC

## 2013-04-04 MED ORDER — SODIUM CHLORIDE 0.9 % IV BOLUS (SEPSIS)
1000.0000 mL | Freq: Once | INTRAVENOUS | Status: AC
  Administered 2013-04-04: 1000 mL via INTRAVENOUS

## 2013-04-04 MED ORDER — FLUCONAZOLE 150 MG PO TABS: 150.0000 mg | ORAL_TABLET | Freq: Once | ORAL | Status: DC

## 2013-04-04 MED ORDER — FLUCONAZOLE 100 MG PO TABS
150.0000 mg | ORAL_TABLET | Freq: Once | ORAL | Status: AC
  Administered 2013-04-04: 150 mg via ORAL
  Filled 2013-04-04: qty 2

## 2013-04-04 MED ORDER — INSULIN ASPART 100 UNIT/ML ~~LOC~~ SOLN
10.0000 [IU] | Freq: Once | SUBCUTANEOUS | Status: AC
  Administered 2013-04-04: 10 [IU] via INTRAVENOUS
  Filled 2013-04-04: qty 1

## 2013-04-04 NOTE — ED Notes (Signed)
Pt is diabatic. stated drinking and urinating  every ten minutes, stated  feeling bad for about week now,  stop takingher mediation and not taking care of herself. Itching and burning when urinating,noted some bloody tinge discharge.  Stated some her blood sugar medication is expensive and she could not afford it, she has being getting samples form her PCP.

## 2013-04-04 NOTE — ED Notes (Signed)
Pt arrives via EMS from home, c/o hyperglycemia. Pt last checked glucose and used medication 7 months ago. CBG on arrival 518. Pt c/o yeast infection, increased urination and thirst and increased stress. 160 palpated. NSR on monitor. 20 LFA. 500 cc NS given by EMS. Resp clear/equal bilat.

## 2013-04-04 NOTE — ED Provider Notes (Signed)
CSN: [id number]     Arrival date & time 04/04/13  1940 History   First MD Initiated Contact with Patient 04/04/13 1944     Chief Complaint  Patient presents with  . Hyperglycemia     (Consider location/radiation/quality/duration/timing/severity/associated sxs/prior Treatment) Patient is a 53 y.o. female presenting with hyperglycemia. The history is provided by the patient and medical records.  Hyperglycemia Associated symptoms: increased thirst and polyuria    This is a 53 year old female with past medical history significant for hypertension, diabetes, bipolar disorder, schizophrenia, anxiety, depression, presenting to the ED for hyperglycemia. Patient states she has not checked her blood sugar or take any medication in the past 7 months. States she was previously getting samples of her medications from her primary care physician, but states she has "gotten lazy" and has not been taking care of herself properly. She states she has not been following a proper diet either.  Over the past week she has developed polyuria and polydipsia.  States she also has some vaginal irritation consistent with yeast infection that did not improved after home remedies.  Denies fevers or chills.  No nausea, vomiting, diarrhea, or abdominal pain.  Past Medical History  Diagnosis Date  . Hypertension   . Diabetes mellitus   . Bipolar 1 disorder   . Schizophrenia   . Anxiety   . Depression    History reviewed. No pertinent past surgical history. No family history on file. History  Substance Use Topics  . Smoking status: Current Some Day Smoker    Types: Cigars  . Smokeless tobacco: Not on file  . Alcohol Use: Yes     Comment: every weekend   OB History   Grav Para Term Preterm Abortions TAB SAB Ect Mult Living                 Review of Systems  Endocrine: Positive for polydipsia and polyuria.  All other systems reviewed and are negative.      Allergies  Review of patient's allergies  indicates no known allergies.  Home Medications   Current Outpatient Rx  Name  Route  Sig  Dispense  Refill  . busPIRone (BUSPAR) 10 MG tablet   Oral   Take 1 tablet (10 mg total) by mouth 3 (three) times daily.   90 tablet   0   . cephALEXin (KEFLEX) 500 MG capsule   Oral   Take 1 capsule (500 mg total) by mouth 3 (three) times daily.   21 capsule   0   . glyBURIDE-metformin (GLUCOVANCE) 5-500 MG per tablet   Oral   Take 2 tablets by mouth 2 (two) times daily. For hyperglycemia.   60 tablet   0   . sertraline (ZOLOFT) 100 MG tablet   Oral   Take 1 tablet (100 mg total) by mouth daily. For anxiety and depression.   30 tablet   0   . sitaGLIPtin (JANUVIA) 100 MG tablet   Oral   Take 1 tablet (100 mg total) by mouth daily. For hyperglycemia.   30 tablet   0    BP 142/65  Pulse 69  Temp(Src) 98.5 F (36.9 C) (Oral)  Resp 18  SpO2 100%  Physical Exam  Nursing note and vitals reviewed. Constitutional: She is oriented to person, place, and time. She appears well-developed and well-nourished.  Non-toxic appearance. No distress.  HENT:  Head: Normocephalic and atraumatic.  Mouth/Throat: Oropharynx is clear and moist.  Eyes: Conjunctivae and EOM are normal. Pupils are  equal, round, and reactive to light.  Neck: Normal range of motion. Neck supple.  Cardiovascular: Normal rate, regular rhythm and normal heart sounds.   Pulmonary/Chest: Effort normal and breath sounds normal. No respiratory distress. She has no wheezes.  Abdominal: Soft. Bowel sounds are normal. There is no tenderness. There is no guarding.  Genitourinary: There is no tenderness or lesion on the right labia. There is no tenderness or lesion on the left labia. Vaginal discharge found.  Pt declined full pelvic exam, would allow exam of external genitalia which appears irritated and excoriated with white, curd-like vaginal discharge surrounding introitus  Musculoskeletal: Normal range of motion. She  exhibits no edema.  Neurological: She is alert and oriented to person, place, and time.  Skin: Skin is warm and dry. She is not diaphoretic.  Psychiatric: She has a normal mood and affect.    ED Course  Procedures (including critical care time) Labs Review Labs Reviewed  CBC WITH DIFFERENTIAL - Abnormal; Notable for the following:    RBC 3.85 (*)    Hemoglobin 11.7 (*)    HCT 33.9 (*)    All other components within normal limits  BASIC METABOLIC PANEL - Abnormal; Notable for the following:    Sodium 136 (*)    Glucose, Bld 446 (*)    All other components within normal limits  URINALYSIS, ROUTINE W REFLEX MICROSCOPIC - Abnormal; Notable for the following:    Color, Urine STRAW (*)    Specific Gravity, Urine 1.037 (*)    Glucose, UA >1000 (*)    All other components within normal limits  URINE MICROSCOPIC-ADD ON - Abnormal; Notable for the following:    Squamous Epithelial / LPF FEW (*)    Bacteria, UA FEW (*)    All other components within normal limits  CBG MONITORING, ED - Abnormal; Notable for the following:    Glucose-Capillary 423 (*)    All other components within normal limits  CBG MONITORING, ED - Abnormal; Notable for the following:    Glucose-Capillary 388 (*)    All other components within normal limits  CBG MONITORING, ED - Abnormal; Notable for the following:    Glucose-Capillary 210 (*)    All other components within normal limits   Imaging Review No results found.  EKG Interpretation   None       MDM   Final diagnoses:  Hyperglycemia  Yeast infection  H/O medication noncompliance   Pt presenting with hyperglycemia, no medications in the past 6 months.  On arrival CBG 423.  Will initiate fluid bolus, obtain basic labs and urine.  Pt declined full pelvic exam, limited external exam is consistent with yeast infection.  Labs with no significant electrolyte imbalance, anion gap of 12.  Renal function preserved.  U/a negative for infection, no ketones.   After fluids and dose of IV insulin glucose now 210.  Dose of diflucan given in the ED.  Will refill DM meds, advised diet control at home. Pt will FU with her PCP this week for re-check.  Discussed plan with pt, she acknowledged understanding and agreed with plan of care.  Larene Pickett, PA-C 04/05/13 0025

## 2013-04-04 NOTE — Discharge Instructions (Signed)
Take the prescribed medication as directed.  Watch your diet and drink plenty of water.  Limit carbs and sugars. Follow-up with your primary care physician this week to discuss this ED visit. Return to the ED for new or worsening symptoms.

## 2013-04-05 NOTE — ED Provider Notes (Signed)
Medical screening examination/treatment/procedure(s) were performed by non-physician practitioner and as supervising physician I was immediately available for consultation/collaboration.  EKG Interpretation   None       Rolland Porter, MD, Abram Sander   Janice Norrie, MD 04/05/13 [phone]

## 2013-08-14 ENCOUNTER — Encounter (HOSPITAL_COMMUNITY): Payer: Self-pay | Admitting: Emergency Medicine

## 2013-08-14 ENCOUNTER — Emergency Department (HOSPITAL_COMMUNITY)
Admission: EM | Admit: 2013-08-14 | Discharge: 2013-08-14 | Disposition: A | Discharge status: Home / Self Care | Payer: Medicaid Other | Attending: Emergency Medicine | Admitting: Emergency Medicine

## 2013-08-14 DIAGNOSIS — Z79899 Other long term (current) drug therapy: Secondary | ICD-10-CM | POA: Insufficient documentation

## 2013-08-14 DIAGNOSIS — Z3202 Encounter for pregnancy test, result negative: Secondary | ICD-10-CM | POA: Insufficient documentation

## 2013-08-14 DIAGNOSIS — F329 Major depressive disorder, single episode, unspecified: Secondary | ICD-10-CM

## 2013-08-14 DIAGNOSIS — F319 Bipolar disorder, unspecified: Secondary | ICD-10-CM | POA: Insufficient documentation

## 2013-08-14 DIAGNOSIS — F411 Generalized anxiety disorder: Secondary | ICD-10-CM | POA: Insufficient documentation

## 2013-08-14 DIAGNOSIS — F32A Depression, unspecified: Secondary | ICD-10-CM

## 2013-08-14 DIAGNOSIS — R42 Dizziness and giddiness: Secondary | ICD-10-CM | POA: Insufficient documentation

## 2013-08-14 DIAGNOSIS — F419 Anxiety disorder, unspecified: Secondary | ICD-10-CM

## 2013-08-14 DIAGNOSIS — F209 Schizophrenia, unspecified: Secondary | ICD-10-CM | POA: Insufficient documentation

## 2013-08-14 DIAGNOSIS — R739 Hyperglycemia, unspecified: Secondary | ICD-10-CM

## 2013-08-14 DIAGNOSIS — E119 Type 2 diabetes mellitus without complications: Secondary | ICD-10-CM | POA: Insufficient documentation

## 2013-08-14 DIAGNOSIS — I10 Essential (primary) hypertension: Secondary | ICD-10-CM | POA: Insufficient documentation

## 2013-08-14 DIAGNOSIS — F17200 Nicotine dependence, unspecified, uncomplicated: Secondary | ICD-10-CM | POA: Insufficient documentation

## 2013-08-14 LAB — COMPREHENSIVE METABOLIC PANEL
ALT: 13 U/L (ref 0–35)
AST: 13 U/L (ref 0–37)
Albumin: 4.4 g/dL (ref 3.5–5.2)
Alkaline Phosphatase: 96 U/L (ref 39–117)
Anion gap: 17 — ABNORMAL HIGH (ref 5–15)
BUN: 16 mg/dL (ref 6–23)
CALCIUM: 10.8 mg/dL — AB (ref 8.4–10.5)
CO2: 24 meq/L (ref 19–32)
Chloride: 99 mEq/L (ref 96–112)
Creatinine, Ser: 1.08 mg/dL (ref 0.50–1.10)
GFR, EST AFRICAN AMERICAN: 67 mL/min — AB (ref >90)
GFR, EST NON AFRICAN AMERICAN: 58 mL/min — AB (ref >90)
GLUCOSE: 351 mg/dL — AB (ref 70–99)
Potassium: 4.2 mEq/L (ref 3.7–5.3)
SODIUM: 140 meq/L (ref 137–147)
Total Bilirubin: 0.7 mg/dL (ref 0.3–1.2)
Total Protein: 8.5 g/dL — ABNORMAL HIGH (ref 6.0–8.3)

## 2013-08-14 LAB — I-STAT CHEM 8, ED
BUN: 11 mg/dL (ref 6–23)
Calcium, Ion: 1.23 mmol/L (ref 1.12–1.23)
Chloride: 104 mEq/L (ref 96–112)
Creatinine, Ser: 0.8 mg/dL (ref 0.50–1.10)
Glucose, Bld: 186 mg/dL — ABNORMAL HIGH (ref 70–99)
HCT: 44 % (ref 36.0–46.0)
HEMOGLOBIN: 15 g/dL (ref 12.0–15.0)
Potassium: 3.6 mEq/L — ABNORMAL LOW (ref 3.7–5.3)
Sodium: 143 mEq/L (ref 137–147)
TCO2: 25 mmol/L (ref 0–100)

## 2013-08-14 LAB — URINALYSIS, ROUTINE W REFLEX MICROSCOPIC
Bilirubin Urine: NEGATIVE
Glucose, UA: >1000 mg/dL — AB
Hgb urine dipstick: NEGATIVE
Ketones, ur: NEGATIVE mg/dL
LEUKOCYTES UA: NEGATIVE
NITRITE: NEGATIVE
Protein, ur: NEGATIVE mg/dL
SPECIFIC GRAVITY, URINE: 1.024 (ref 1.005–1.030)
UROBILINOGEN UA: 1 mg/dL (ref 0.0–1.0)
pH: 5 (ref 5.0–8.0)

## 2013-08-14 LAB — URINE MICROSCOPIC-ADD ON

## 2013-08-14 LAB — RAPID URINE DRUG SCREEN, HOSP PERFORMED
Amphetamines: NOT DETECTED
Barbiturates: NOT DETECTED
Benzodiazepines: NOT DETECTED
Cocaine: NOT DETECTED
Opiates: NOT DETECTED
TETRAHYDROCANNABINOL: NOT DETECTED

## 2013-08-14 LAB — CBC
HEMATOCRIT: 40.2 % (ref 36.0–46.0)
HEMOGLOBIN: 13.9 g/dL (ref 12.0–15.0)
MCH: 30.5 pg (ref 26.0–34.0)
MCHC: 34.6 g/dL (ref 30.0–36.0)
MCV: 88.2 fL (ref 78.0–100.0)
Platelets: 306 10*3/uL (ref 150–400)
RBC: 4.56 MIL/uL (ref 3.87–5.11)
RDW: 12.5 % (ref 11.5–15.5)
WBC: 9.6 10*3/uL (ref 4.0–10.5)

## 2013-08-14 LAB — TROPONIN I: Troponin I: <0.3 ng/mL (ref <0.30)

## 2013-08-14 LAB — SALICYLATE LEVEL: Salicylate Lvl: <2 mg/dL — ABNORMAL LOW (ref 2.8–20.0)

## 2013-08-14 LAB — ETHANOL: Alcohol, Ethyl (B): <11 mg/dL (ref 0–11)

## 2013-08-14 LAB — PREGNANCY, URINE: Preg Test, Ur: NEGATIVE

## 2013-08-14 LAB — ACETAMINOPHEN LEVEL

## 2013-08-14 MED ORDER — SODIUM CHLORIDE 0.9 % IV BOLUS (SEPSIS)
1000.0000 mL | Freq: Once | INTRAVENOUS | Status: AC
  Administered 2013-08-14: 1000 mL via INTRAVENOUS
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
## 2013-08-14 NOTE — ED Notes (Signed)
Pt reports was outside in the sun all day, not drinking anything, started to feel "woozy." Denies any pain. Denies any falls or LOC. Pt any sx at this time, states "wooziness went away when I cooled off."
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
## 2013-08-14 NOTE — ED Notes (Signed)
Bed: [id number] Expected date:  Expected time:  Means of arrival:  Comments: EMS/anxiety/htn
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
## 2013-08-14 NOTE — BH Assessment (Signed)
Assessment completed. Consulted with Darlyne Russian, PA-C who agrees that patient does not meet inpatient criteria. It is recommended that patient follow up with her outpatient therapist at the [facility] and get back on her medications. Pt has also agreed to attend support groups in the community. Lucila Maine, PA-C has been notified of the recommendations.
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
## 2013-08-14 NOTE — ED Notes (Signed)
Verbal order from MD to wait for 2nd liter of fluids to be finished before doing blood work

## 2013-08-14 NOTE — Discharge Instructions (Signed)
Talk to your psychiatrist Take your diabetes medications as scheduled  Return to the emergency department if you develop any changing/worsening condition, chest pain, SOB, passing out, severe headache, confusion, repeated vomiting, suicidal thoughts or any other concerns (please read additional information regarding your condition below)     Generalized Anxiety Disorder Generalized anxiety disorder (GAD) is a mental disorder. It interferes with life functions, including relationships, work, and school. GAD is different from normal anxiety, which everyone experiences at some point in their lives in response to specific life events and activities. Normal anxiety actually helps [place] prepare for and get through these life events and activities. Normal anxiety goes away after the event or activity is over.  GAD causes anxiety that is not necessarily related to specific events or activities. It also causes excess anxiety in proportion to specific events or activities. The anxiety associated with GAD is also difficult to control. GAD can vary from mild to severe. People with severe GAD can have intense waves of anxiety with physical symptoms (panic attacks).  SYMPTOMS The anxiety and worry associated with GAD are difficult to control. This anxiety and worry are related to many life events and activities and also occur more days than not for 6 months or longer. People with GAD also have three or more of the following symptoms (one or more in children):  Restlessness.   Fatigue.  Difficulty concentrating.   Irritability.  Muscle tension.  Difficulty sleeping or unsatisfying sleep. DIAGNOSIS GAD is diagnosed through an assessment by your caregiver. Your caregiver will ask you questions aboutyour mood,physical symptoms, and events in your life. Your caregiver may ask you about your medical history and use of alcohol or drugs, including prescription medications. Your caregiver may also do a physical  exam and blood tests. Certain medical conditions and the use of certain substances can cause symptoms similar to those associated with GAD. Your caregiver may refer you to a mental health specialist for further evaluation. TREATMENT The following therapies are usually used to treat GAD:   Medication--Antidepressant medication usually is prescribed for long-term daily control. Antianxiety medications may be added in severe cases, especially when panic attacks occur.   Talk therapy (psychotherapy)--Certain types of talk therapy can be helpful in treating GAD by providing support, education, and guidance. A form of talk therapy called cognitive behavioral therapy can teach you healthy ways to think about and react to daily life events and activities.  Stress managementtechniques--These include yoga, meditation, and exercise and can be very helpful when they are practiced regularly. A mental health specialist can help determine which treatment is best for you. Some people see improvement with one therapy. However, other people require a combination of therapies. Document Released: 05/25/2012 Document Reviewed: 05/25/2012 [facility] Patient Information 2015 [place]. This information is not intended to replace advice given to you by your health care provider. Make sure you discuss any questions you have with your health care provider.  Depression, Adult Depression refers to feeling sad, low, down in the dumps, blue, gloomy, or empty. In general, there are two kinds of depression: 1. Depression that we all experience from time to time because of upsetting life experiences, including the loss of a job or the ending of a relationship (normal sadness or normal grief). This kind of depression is considered normal, is short lived, and resolves within a few days to 2 weeks. (Depression experienced after the loss of a loved one is called bereavement. Bereavement often lasts longer than 2 weeks but  normally  gets better with time.) 2. Clinical depression, which lasts longer than normal sadness or normal grief or interferes with your ability to function at home, at work, and in school. It also interferes with your personal relationships. It affects almost every aspect of your life. Clinical depression is an illness. Symptoms of depression also can be caused by conditions other than normal sadness and grief or clinical depression. Examples of these conditions are listed as follows:  Physical illness--Some physical illnesses, including underactive thyroid gland (hypothyroidism), severe anemia, specific types of cancer, diabetes, uncontrolled seizures, heart and lung problems, strokes, and chronic pain are commonly associated with symptoms of depression.  Side effects of some prescription medicine--In some people, certain types of prescription medicine can cause symptoms of depression.  Substance abuse--Abuse of alcohol and illicit drugs can cause symptoms of depression. SYMPTOMS Symptoms of normal sadness and normal grief include the following:  Feeling sad or crying for short periods of time.  Not caring about anything (apathy).  Difficulty sleeping or sleeping too much.  No longer able to enjoy the things you used to enjoy.  Desire to be by oneself all the time (social isolation).  Lack of energy or motivation.  Difficulty concentrating or remembering.  Change in appetite or weight.  Restlessness or agitation. Symptoms of clinical depression include the same symptoms of normal sadness or normal grief and also the following symptoms:  Feeling sad or crying all the time.  Feelings of guilt or worthlessness.  Feelings of hopelessness or helplessness.  Thoughts of suicide or the desire to harm yourself (suicidal ideation).  Loss of touch with reality (psychotic symptoms). Seeing or hearing things that are not real (hallucinations) or having false beliefs about your life or the people  around you (delusions and paranoia). DIAGNOSIS  The diagnosis of clinical depression usually is based on the severity and duration of the symptoms. Your caregiver also will ask you questions about your medical history and substance use to find out if physical illness, use of prescription medicine, or substance abuse is causing your depression. Your caregiver also may order blood tests. TREATMENT  Typically, normal sadness and normal grief do not require treatment. However, sometimes antidepressant medicine is prescribed for bereavement to ease the depressive symptoms until they resolve. The treatment for clinical depression depends on the severity of your symptoms but typically includes antidepressant medicine, counseling with a mental health professional, or a combination of both. Your caregiver will help to determine what treatment is best for you. Depression caused by physical illness usually goes away with appropriate medical treatment of the illness. If prescription medicine is causing depression, talk with your caregiver about stopping the medicine, decreasing the dose, or substituting another medicine. Depression caused by abuse of alcohol or illicit drugs abuse goes away with abstinence from these substances. Some adults need professional help in order to stop drinking or using drugs. SEEK IMMEDIATE CARE IF:  You have thoughts about hurting yourself or others.  You lose touch with reality (have psychotic symptoms).  You are taking medicine for depression and have a serious side effect. FOR MORE INFORMATION National Alliance on Mental Illness: www.nami.[employer]: https://carter.com/ Document Released: 01/26/2000 Document Revised: 07/30/2011 Document Reviewed: 04/29/2011 [facility] Patient Information 2015 [place], [place]. This information is not intended to replace advice given to you by your health care provider. Make sure you discuss any questions you have with  your health care provider.  Dizziness Dizziness is a common problem. It is  a feeling of unsteadiness or light-headedness. You may feel like you are about to faint. Dizziness can lead to injury if you stumble or fall. A person of any age group can suffer from dizziness, but dizziness is more common in older adults. CAUSES  Dizziness can be caused by many different things, including:  Middle ear problems.  Standing for too long.  Infections.  An allergic reaction.  Aging.  An emotional response to something, such as the sight of blood.  Side effects of medicines.  Tiredness.  Problems with circulation or blood pressure.  Excessive use of alcohol or medicines, or illegal drug use.  Breathing too fast (hyperventilation).  An irregular heart rhythm (arrhythmia).  A low red blood cell count (anemia).  Pregnancy.  Vomiting, diarrhea, fever, or other illnesses that cause body fluid loss (dehydration).  Diseases or conditions such as Parkinson's disease, high blood pressure (hypertension), diabetes, and thyroid problems.  Exposure to extreme heat. DIAGNOSIS  Your health care provider will ask about your symptoms, perform a physical exam, and perform an electrocardiogram (ECG) to record the electrical activity of your heart. Your health care provider may also perform other heart or blood tests to determine the cause of your dizziness. These may include:  Transthoracic echocardiogram (TTE). During echocardiography, sound waves are used to evaluate how blood flows through your heart.  Transesophageal echocardiogram (TEE).  Cardiac monitoring. This allows your health care provider to monitor your heart rate and rhythm in real time.  Holter monitor. This is a portable device that records your heartbeat and can help diagnose heart arrhythmias. It allows your health care provider to track your heart activity for several days if needed.  Stress tests by exercise or by giving  medicine that makes the heart beat faster. TREATMENT  Treatment of dizziness depends on the cause of your symptoms and can vary greatly. HOME CARE INSTRUCTIONS   Drink enough fluids to keep your urine clear or pale yellow. This is especially important in very hot weather. In older adults, it is also important in cold weather.  Take your medicine exactly as directed if your dizziness is caused by medicines. When taking blood pressure medicines, it is especially important to get up slowly.  Rise slowly from chairs and steady yourself until you feel okay.  In the morning, first sit up on the side of the bed. When you feel okay, stand slowly while holding onto something until you know your balance is fine.  Move your legs often if you need to stand in one place for a long time. Tighten and relax your muscles in your legs while standing.  Have someone stay with you for 1-2 days if dizziness continues to be a problem. Do this until you feel you are well enough to stay alone. Have the person call your health care provider if he or she notices changes in you that are concerning.  Do not drive or use heavy machinery if you feel dizzy.  Do not drink alcohol. SEEK IMMEDIATE MEDICAL CARE IF:   Your dizziness or light-headedness gets worse.  You feel nauseous or vomit.  You have problems talking, walking, or using your arms, hands, or legs.  You feel weak.  You are not thinking clearly or you have trouble forming sentences. It may take a friend or family member to notice this.  You have chest pain, abdominal pain, shortness of breath, or sweating.  Your vision changes.  You notice any bleeding.  You have side effects from  medicine that seems to be getting worse rather than better. MAKE SURE YOU:   Understand these instructions.  Will watch your condition.  Will get help right away if you are not doing well or get worse. Document Released: 07/24/2000 Document Revised: 02/02/2013  Document Reviewed: 08/17/2010 [facility] Patient Information 2015 [place], [place]. This information is not intended to replace advice given to you by your health care provider. Make sure you discuss any questions you have with your health care provider.  Hyperglycemia Hyperglycemia occurs when the glucose (sugar) in your blood is too high. Hyperglycemia can happen for many reasons, but it most often happens to people who do not know they have diabetes or are not managing their diabetes properly.  CAUSES  Whether you have diabetes or not, there are other causes of hyperglycemia. Hyperglycemia can occur when you have diabetes, but it can also occur in other situations that you might not be as aware of, such as: Diabetes  If you have diabetes and are having problems controlling your blood glucose, hyperglycemia could occur because of some of the following reasons:  Not following your meal plan.  Not taking your diabetes medications or not taking it properly.  Exercising less or doing less activity than you normally do.  Being sick. Pre-diabetes  This cannot be ignored. Before people develop Type 2 diabetes, they almost always have "pre-diabetes." This is when your blood glucose levels are higher than normal, but not yet high enough to be diagnosed as diabetes. Research has shown that some long-term damage to the body, especially the heart and circulatory system, may already be occurring during pre-diabetes. If you take action to manage your blood glucose when you have pre-diabetes, you may delay or prevent Type 2 diabetes from developing. Stress  If you have diabetes, you may be "diet" controlled or on oral medications or insulin to control your diabetes. However, you may find that your blood glucose is higher than usual in the hospital whether you have diabetes or not. This is often referred to as "stress hyperglycemia." Stress can elevate your blood glucose. This happens because of hormones put  out by the body during times of stress. If stress has been the cause of your high blood glucose, it can be followed regularly by your caregiver. That way he/she can make sure your hyperglycemia does not continue to get worse or progress to diabetes. Steroids  Steroids are medications that act on the infection fighting system (immune system) to block inflammation or infection. One side effect can be a rise in blood glucose. Most people can produce enough extra insulin to allow for this rise, but for those who cannot, steroids make blood glucose levels go even higher. It is not unusual for steroid treatments to "uncover" diabetes that is developing. It is not always possible to determine if the hyperglycemia will go away after the steroids are stopped. A special blood test called an A1c is sometimes done to determine if your blood glucose was elevated before the steroids were started. SYMPTOMS  Thirsty.  Frequent urination.  Dry mouth.  Blurred vision.  Tired or fatigue.  Weakness.  Sleepy.  Tingling in feet or leg. DIAGNOSIS  Diagnosis is made by monitoring blood glucose in one or all of the following ways:  A1c test. This is a chemical found in your blood.  Fingerstick blood glucose monitoring.  Laboratory results. TREATMENT  First, knowing the cause of the hyperglycemia is important before the hyperglycemia can be treated. Treatment may include,  but is not be limited to:  Education.  Change or adjustment in medications.  Change or adjustment in meal plan.  Treatment for an illness, infection, etc.  More frequent blood glucose monitoring.  Change in exercise plan.  Decreasing or stopping steroids.  Lifestyle changes. HOME CARE INSTRUCTIONS   Test your blood glucose as directed.  Exercise regularly. Your caregiver will give you instructions about exercise. Pre-diabetes or diabetes which comes on with stress is helped by exercising.  Eat wholesome, balanced meals.  Eat often and at regular, fixed times. Your caregiver or nutritionist will give you a meal plan to guide your sugar intake.  Being at an ideal weight is important. If needed, losing as little as 10 to 15 pounds may help improve blood glucose levels. SEEK MEDICAL CARE IF:   You have questions about medicine, activity, or diet.  You continue to have symptoms (problems such as increased thirst, urination, or weight gain). SEEK IMMEDIATE MEDICAL CARE IF:   You are vomiting or have diarrhea.  Your breath smells fruity.  You are breathing faster or slower.  You are very sleepy or incoherent.  You have numbness, tingling, or pain in your feet or hands.  You have chest pain.  Your symptoms get worse even though you have been following your caregiver's orders.  If you have any other questions or concerns. Document Released: 07/24/2000 Document Revised: 04/22/2011 Document Reviewed: 05/27/2011 [facility] Patient Information 2015 [place], [place]. This information is not intended to replace advice given to you by your health care provider. Make sure you discuss any questions you have with your health care provider.  Type 2 Diabetes Mellitus, Adult Type 2 diabetes mellitus, often simply referred to as type 2 diabetes, is a long-lasting (chronic) disease. In type 2 diabetes, the pancreas does not make enough insulin (a hormone), the cells are less responsive to the insulin that is made (insulin resistance), or both. Normally, insulin moves sugars from food into the tissue cells. The tissue cells use the sugars for energy. The lack of insulin or the lack of normal response to insulin causes excess sugars to build up in the blood instead of going into the tissue cells. As a result, high blood sugar (hyperglycemia) develops. The effect of high sugar (glucose) levels can cause many complications. Type 2 diabetes was also previously called adult-onset diabetes but it can occur at any age.  RISK FACTORS    A person is predisposed to developing type 2 diabetes if someone in the family has the disease and also has one or more of the following primary risk factors:  Overweight.  An inactive lifestyle.  A history of consistently eating high-calorie foods. Maintaining a normal weight and regular physical activity can reduce the chance of developing type 2 diabetes. SYMPTOMS  A person with type 2 diabetes may not show symptoms initially. The symptoms of type 2 diabetes appear slowly. The symptoms include:  Increased thirst (polydipsia).  Increased urination (polyuria).  Increased urination during the night (nocturia).  Weight loss. This weight loss may be rapid.  Frequent, recurring infections.  Tiredness (fatigue).  Weakness.  Vision changes, such as blurred vision.  Fruity smell to your breath.  Abdominal pain.  Nausea or vomiting.  Cuts or bruises which are slow to heal.  Tingling or numbness in the hands or feet. DIAGNOSIS Type 2 diabetes is frequently not diagnosed until complications of diabetes are present. Type 2 diabetes is diagnosed when symptoms or complications are present and when blood glucose  levels are increased. Your blood glucose level may be checked by one or more of the following blood tests:  A fasting blood glucose test. You will not be allowed to eat for at least 8 hours before a blood sample is taken.  A random blood glucose test. Your blood glucose is checked at any time of the day regardless of when you ate.  A hemoglobin A1c blood glucose test. A hemoglobin A1c test provides information about blood glucose control over the previous 3 months.  An oral glucose tolerance test (OGTT). Your blood glucose is measured after you have not eaten (fasted) for 2 hours and then after you drink a glucose-containing beverage. TREATMENT   You may need to take insulin or diabetes medicine daily to keep blood glucose levels in the desired range.  If you use  insulin, you may need to adjust the dosage depending on the carbohydrates that you eat with each meal or snack. The treatment goal is to maintain the before meal blood sugar (preprandial glucose) level at 70-130 mg/dL. HOME CARE INSTRUCTIONS   Have your hemoglobin A1c level checked twice a year.  Perform daily blood glucose monitoring as directed by your health care provider.  Monitor urine ketones when you are ill and as directed by your health care provider.  Take your diabetes medicine or insulin as directed by your health care provider to maintain your blood glucose levels in the desired range.  Never run out of diabetes medicine or insulin. It is needed every day.  If you are using insulin, you may need to adjust the amount of insulin given based on your intake of carbohydrates. Carbohydrates can raise blood glucose levels but need to be included in your diet. Carbohydrates provide vitamins, minerals, and fiber which are an essential part of a healthy diet. Carbohydrates are found in fruits, vegetables, whole grains, dairy products, legumes, and foods containing added sugars.  Eat healthy foods. You should make an appointment to see a registered dietitian to help you create an eating plan that is right for you.  Lose weight if overweight.  Carry a medical alert card or wear your medical alert jewelry.  Carry a 15 gram carbohydrate snack with you at all times to treat low blood glucose (hypoglycemia). Some examples of 15 gram carbohydrate snacks include:  Glucose tablets, 3 or 4  Raisins, 2 tablespoons (24 grams)  Jelly beans, 6  Animal crackers, 8  Regular pop, 4 ounces (120 mL)  Gummy treats, 9  Recognize hypoglycemia. Hypoglycemia occurs with blood glucose levels of 70 mg/dL and below. The risk for hypoglycemia increases when fasting or skipping meals, during or after intense exercise, and during sleep. Hypoglycemia symptoms can include:  Tremors or shakes.  Decreased  ability to concentrate.  Sweating.  Increased heart rate.  Headache.  Dry mouth.  Hunger.  Irritability.  Anxiety.  Restless sleep.  Altered speech or coordination.  Confusion.  Treat hypoglycemia promptly. If you are alert and able to safely swallow, follow the 15:15 rule:  Take 15-20 grams of rapid-acting glucose or carbohydrate. Rapid-acting options include glucose gel, glucose tablets, or 4 ounces (120 mL) of fruit juice, regular soda, or low fat milk.  Check your blood glucose level 15 minutes after taking the glucose.  Take 15-20 grams more of glucose if the repeat blood glucose level is still 70 mg/dL or below.  Eat a meal or snack within 1 hour once blood glucose levels return to normal.  Be alert to feeling  very thirsty and urinating more frequently than usual, which are early signs of hyperglycemia. An early awareness of hyperglycemia allows for prompt treatment. Treat hyperglycemia as directed by your health care provider.  Engage in at least 150 minutes of moderate-intensity physical activity a week, spread over at least 3 days of the week or as directed by your health care provider. In addition, you should engage in resistance exercise at least 2 times a week or as directed by your health care provider.  Adjust your medicine and food intake as needed if you start a new exercise or sport.  Follow your sick day plan at any time you are unable to eat or drink as usual.  Avoid tobacco use.  Limit alcohol intake to no more than 1 drink per day for nonpregnant women and 2 drinks per day for men. You should drink alcohol only when you are also eating food. Talk with your health care provider whether alcohol is safe for you. Tell your health care provider if you drink alcohol several times a week.  Follow up with your health care provider regularly.  Schedule an eye exam soon after the diagnosis of type 2 diabetes and then annually.  Perform daily skin and foot  care. Examine your skin and feet daily for cuts, bruises, redness, nail problems, bleeding, blisters, or sores. A foot exam by a health care provider should be done annually.  Brush your teeth and gums at least twice a day and floss at least once a day. Follow up with your dentist regularly.  Share your diabetes management plan with your workplace or school.  Stay up-to-date with immunizations.  Learn to manage stress.  Obtain ongoing diabetes education and support as needed.  Participate in, or seek rehabilitation as needed to maintain or improve independence and quality of life. Request a physical or occupational therapy referral if you are having foot or hand numbness or difficulties with grooming, dressing, eating, or physical activity. SEEK MEDICAL CARE IF:   You are unable to eat food or drink fluids for more than 6 hours.  You have nausea and vomiting for more than 6 hours.  Your blood glucose level is over 240 mg/dL.  There is a change in mental status.  You develop an additional serious illness.  You have diarrhea for more than 6 hours.  You have been sick or have had a fever for a couple of days and are not getting better.  You have pain during any physical activity.  SEEK IMMEDIATE MEDICAL CARE IF:  You have difficulty breathing.  You have moderate to large ketone levels. MAKE SURE YOU:  Understand these instructions.  Will watch your condition.  Will get help right away if you are not doing well or get worse. Document Released: 01/28/2005 Document Revised: 02/02/2013 Document Reviewed: 08/27/2011 [facility] Patient Information 2015 [place], [place]. This information is not intended to replace advice given to you by your health care provider. Make sure you discuss any questions you have with your health care provider.   Emergency Department Resource Guide 1) Find a Doctor and Pay Out of Pocket Although you won't have to find out who is covered by your  insurance plan, it is a good idea to ask around and get recommendations. You will then need to call the office and see if the doctor you have chosen will accept you as a new patient and what types of options they offer for patients who are self-pay. Some doctors offer discounts or  will set up payment plans for their patients who do not have insurance, but you will need to ask so you aren't surprised when you get to your appointment.  2) Contact Your Local Health Department Not all health departments have doctors that can see patients for sick visits, but many do, so it is worth a call to see if yours does. If you don't know where your local health department is, you can check in your phone book. The CDC also has a tool to help you locate your state's health department, and many state websites also have listings of all of their local health departments.  3) Find a [facility] If your illness is not likely to be very severe or complicated, you may want to try a walk in clinic. These are popping up all over the country in pharmacies, drugstores, and shopping centers. They're usually staffed by nurse practitioners or physician assistants that have been trained to treat common illnesses and complaints. They're usually fairly quick and inexpensive. However, if you have serious medical issues or chronic medical problems, these are probably not your best option.  No Primary Care Doctor: - Call Health Connect at  [phone] - they can help you locate a primary care doctor that  accepts your insurance, provides certain services, etc. - Physician Referral Service- [phone]  Chronic Pain Problems: Organization         Address  Phone   Notes  [facility]  [phone] Patients need to be referred by their primary care doctor.   Medication Assistance: Organization         Address  Phone   Notes  [facility] Medication [facility] [address] [place], [place] [address] [phone] --Must be a resident of [facility] -- Must have NO insurance coverage whatsoever (no Medicaid/ Medicare, etc.) -- The pt. MUST have a primary care doctor that directs their care regularly and follows them in the community   MedAssist  [phone]   [employer]  [phone]    Agencies that provide inexpensive medical care: Organization         Address  Phone   Notes  [place]  [phone]   [facility]    [phone]   [facility] [place], [place] [address] [phone]   [place] [address], [place] [phone]   Planned Parenthood    [phone]   [facility]    [phone]   [place] [address], [place] Phone:  [phone], Fax:  [phone] Hours of Operation:  9 am - 6 pm, M-F.  Also accepts Medicaid/Medicare and self-pay.  [facility] [place], [address], [place] Phone: [phone], Fax: [phone]. Hours of Operation:  8:30 am - 5:30 pm, M-F.  Also accepts Medicaid and self-pay.  [facility] [place] [address], [place] Phone: [phone]   [place], [place], [place] [phone], Ext. [phone] Mondays & Thursdays: 7-9 AM.  First 15 patients are seen on a first come, first serve basis.    [place] Providers:  Organization         Address  Phone   Notes  [facility] [address], [place] [phone]  Also accepts self-pay patients.  [facility] [id number] [place], [place]  [phone]   [place], [address], [place] [phone]   [facility] [address], [place] [phone]   Lucianne Lei [address], [place]   [phone] Only accepts [place] Access [place] patients after they have their name applied to their card.   Self-Pay (no insurance) in [facility]:  Organization         Address  Phone   Notes  Sickle Cell Patients, [facility] [place] [phone]   [facility] [place] [phone]   [facility] [place]  [place], [place], [place] [phone]   [facility]/Dr. Osei-Bonsu  [address] [place] or [address], [place] [phone] Phone number for both [place] and [place] locations is the same.  [facility] [address] [place] [phone]   [facility] [address], [place] or [address] [phone]   [facility] [address] [place] [phone], phone; [phone], fax Sees patients 1st and 3rd Saturday of every month.  Must not qualify for public or private insurance (i.e. Medicaid, Medicare, [place] Health Choice, Veterans' Benefits)  Household income should be no more than 200% of the poverty level The clinic cannot treat you if you are pregnant or think you are pregnant  Sexually transmitted diseases are not treated at the clinic.    Dental Care: Organization         Address  Phone  Notes  [facility] Department of [facility] [place] [phone] Accepts children up to age 55 who are enrolled in [place] or [place]; pregnant women with a Medicaid card; and children who have applied for Medicaid or [place] Health Choice, but were declined, whose parents can pay a reduced fee at time of service.  [facility] Department of [facility]  [address], [place] [phone] Accepts children up to age 61 who are enrolled  in [place] or [place]; pregnant women with a Medicaid card; and children who have applied for Medicaid or [place] Health Choice, but were declined, whose parents can pay a reduced fee at time of service.  [place] Adult Dental Access PROGRAM  [place] [phone] Patients are seen by appointment only. Walk-ins are not accepted. [place] will see patients 56 years of age and older. Monday - Tuesday (8am-5pm) Most Wednesdays (8:30-5pm) $30 per visit, cash only  [facility] Adult Dental Access PROGRAM  [address], [facility] [phone] Patients are seen by appointment only. Walk-ins are not accepted. [place] will see patients 56 years of age and older. One Wednesday Evening (Monthly: Volunteer Based).  $30 per visit, cash only  [place]  [phone] for adults; Children under age 57, call Graduate Pediatric Dentistry at [phone]. Children aged 59-14, please call [phone] to request a pediatric application.  Dental services are provided in all areas of dental care including fillings, crowns and bridges, complete and partial dentures, implants, gum treatment, root canals, and extractions. Preventive care is also provided. Treatment  is provided to both adults and children. Patients are selected via a lottery and there is often a waiting list.   [facility] [address], [place]  [phone] [email or web address]   [facility] [address] [place], [place] [phone], Ext. [phone] Second and Fourth Thursday of each month, opens at 6:30 AM; Clinic ends at 9 AM.  Patients are seen on a first-come first-served basis, and a limited number are seen during each clinic.   [facility]  [address] Hillard Danker [place], [place] [phone]   Eligibility Requirements You must have lived in [place], [place], or [place] counties for at least the last three months.   You cannot be  eligible for state or federal sponsored [employer], including [employer], [place], or [employer].   You generally cannot be eligible for healthcare insurance through your employer.    How to apply: Eligibility screenings are held every Tuesday and Wednesday afternoon from 1:00 pm until 4:00 pm. You do not need an appointment for the interview!  [facility] [address], [place], [place]   [place]  [place] Department  [place]  [phone]    Behavioral Health Resources in the Community: Intensive Outpatient Programs Organization         Address  Phone  Notes  [place]. [address] [place], [place] [phone]   [facility] Outpatient [address], [place], [place]   ADS: Alcohol & Drug Svcs [address] [place], [place]   [place] [address],  [place], [place] or [phone]   Substance Abuse Resources Organization         Address  Phone  Notes  Alcohol and Drug Services  [phone]   [place]  [phone]   The [place]   Chinita Pester  [phone]   Residential & Outpatient Substance Abuse Program  [phone]   Psychological Services Organization         Address  Phone  Notes  [facility] [place]  [place]  [phone]   [place] [address] [place] or [phone]    Mobile Crisis Teams Organization         Address  Phone  Notes  [facility], Mobile Crisis Care Unit  [phone]   [facility]  [address] [place], [place]   [place] Levels [address] [place] [phone]    Self-Help/Support Groups Organization          Address  Phone             Notes  [place]. of [place] - variety of support groups  [place] Call for more information  Narcotics Anonymous (NA), [facility]aring [facility] [address], [employer] [place]  2 meetings at this location   Special educational needs teacher         Address  Phone  Notes  [facility] Residential Treatment [place],    [place]  [phone]   [facility]  [address] [place] [phone], [place], [place]   [place], [place] [phone] Admissions: 8am-3pm M-F  Incentives Substance [place] [address]    [place],  [place] [phone]   [facility] [address] Jadene Pierini [place], [place]   The [place].,  [place], [place]   [facility] - Intensive Outpatient [address] Krist[address], [place], [place]   [facility] ([place] [address]  [place], [place] [phone] or [phone]   [facility] Services (RTS) [address] [place], [place] Accepts Medicaid  [facility] [address],  [place] [phone] Substance Abuse/Addiction Treatment   [facility] Organization         Address  Phone  Notes  [facility]  [phone]   Domenic Schwab, PhD [address] Arlis Porta [place], [place]   [phone] or [phone]   [place], [place] [phone]   [facility] [address] [place], [place] [phone] Insurance/Medicaid/sponsorship through [facility] and Families [address], [address]                                    [place], [place] [phone] [place] [address] [place] [address], [place] [phone]    Dr. Adele Schilder  [phone]   [facility] [address] [facility] Dept. 1) [address] [place] 2) [place] 3)  [place] [address], [place] [phone]  [phone]   [place] [phone] or [phone] (After Hours)

## 2013-08-15 NOTE — ED Provider Notes (Signed)
  Medical screening examination/treatment/procedure(s) were performed by non-physician practitioner and as supervising physician I was immediately available for consultation/collaboration.   EKG Interpretation None         Carmin Muskrat, MD 08/15/13 1645

## 2013-08-15 NOTE — Consult Note (Signed)
Psychiatric supervisory review confirms findings for existing diagnoses and reestablishment of former treatment modalities necessitated by relational decompensation consequences.  Delight Hoh, MD

## 2013-08-29 ENCOUNTER — Emergency Department (HOSPITAL_COMMUNITY)
Admission: EM | Admit: 2013-08-29 | Discharge: 2013-08-29 | Disposition: A | Discharge status: Other Acute Inpatient Hospital | Payer: Medicaid Other | Attending: Emergency Medicine | Admitting: Emergency Medicine

## 2013-08-29 ENCOUNTER — Encounter (HOSPITAL_COMMUNITY): Payer: Self-pay

## 2013-08-29 ENCOUNTER — Inpatient Hospital Stay (HOSPITAL_COMMUNITY)
Admission: AD | Admit: 2013-08-29 | Discharge: 2013-09-03 | DRG: 885 | Disposition: A | Discharge status: Home / Self Care | Payer: Federal, State, Local not specified - Other | Source: Intra-hospital | Attending: Psychiatry | Admitting: Psychiatry

## 2013-08-29 ENCOUNTER — Encounter (HOSPITAL_COMMUNITY): Payer: Self-pay | Admitting: Emergency Medicine

## 2013-08-29 DIAGNOSIS — F17200 Nicotine dependence, unspecified, uncomplicated: Secondary | ICD-10-CM | POA: Insufficient documentation

## 2013-08-29 DIAGNOSIS — Z598 Other problems related to housing and economic circumstances: Secondary | ICD-10-CM

## 2013-08-29 DIAGNOSIS — Z91199 Patient's noncompliance with other medical treatment and regimen due to unspecified reason: Secondary | ICD-10-CM

## 2013-08-29 DIAGNOSIS — F429 Obsessive-compulsive disorder, unspecified: Secondary | ICD-10-CM

## 2013-08-29 DIAGNOSIS — R45851 Suicidal ideations: Secondary | ICD-10-CM | POA: Insufficient documentation

## 2013-08-29 DIAGNOSIS — Z8659 Personal history of other mental and behavioral disorders: Secondary | ICD-10-CM | POA: Insufficient documentation

## 2013-08-29 DIAGNOSIS — F410 Panic disorder [episodic paroxysmal anxiety] without agoraphobia: Secondary | ICD-10-CM

## 2013-08-29 DIAGNOSIS — F251 Schizoaffective disorder, depressive type: Secondary | ICD-10-CM

## 2013-08-29 DIAGNOSIS — I10 Essential (primary) hypertension: Secondary | ICD-10-CM

## 2013-08-29 DIAGNOSIS — F319 Bipolar disorder, unspecified: Secondary | ICD-10-CM | POA: Insufficient documentation

## 2013-08-29 DIAGNOSIS — Z9119 Patient's noncompliance with other medical treatment and regimen: Secondary | ICD-10-CM

## 2013-08-29 DIAGNOSIS — E7800 Pure hypercholesterolemia, unspecified: Secondary | ICD-10-CM

## 2013-08-29 DIAGNOSIS — Z91148 Patient's other noncompliance with medication regimen for other reason: Secondary | ICD-10-CM

## 2013-08-29 DIAGNOSIS — Z9114 Patient's other noncompliance with medication regimen: Secondary | ICD-10-CM

## 2013-08-29 DIAGNOSIS — F259 Schizoaffective disorder, unspecified: Principal | ICD-10-CM | POA: Diagnosis present

## 2013-08-29 DIAGNOSIS — G4700 Insomnia, unspecified: Secondary | ICD-10-CM | POA: Diagnosis present

## 2013-08-29 DIAGNOSIS — Z87891 Personal history of nicotine dependence: Secondary | ICD-10-CM

## 2013-08-29 DIAGNOSIS — Z5989 Other problems related to housing and economic circumstances: Secondary | ICD-10-CM

## 2013-08-29 DIAGNOSIS — IMO0001 Reserved for inherently not codable concepts without codable children: Secondary | ICD-10-CM

## 2013-08-29 DIAGNOSIS — Z5987 Material hardship due to limited financial resources, not elsewhere classified: Secondary | ICD-10-CM

## 2013-08-29 DIAGNOSIS — F411 Generalized anxiety disorder: Secondary | ICD-10-CM | POA: Diagnosis present

## 2013-08-29 DIAGNOSIS — E119 Type 2 diabetes mellitus without complications: Secondary | ICD-10-CM | POA: Diagnosis present

## 2013-08-29 DIAGNOSIS — D649 Anemia, unspecified: Secondary | ICD-10-CM

## 2013-08-29 DIAGNOSIS — F200 Paranoid schizophrenia: Secondary | ICD-10-CM

## 2013-08-29 DIAGNOSIS — E1165 Type 2 diabetes mellitus with hyperglycemia: Secondary | ICD-10-CM

## 2013-08-29 DIAGNOSIS — F32A Depression, unspecified: Secondary | ICD-10-CM | POA: Diagnosis present

## 2013-08-29 DIAGNOSIS — Z5689 Other problems related to employment: Secondary | ICD-10-CM

## 2013-08-29 DIAGNOSIS — E559 Vitamin D deficiency, unspecified: Secondary | ICD-10-CM | POA: Diagnosis present

## 2013-08-29 DIAGNOSIS — J019 Acute sinusitis, unspecified: Secondary | ICD-10-CM

## 2013-08-29 DIAGNOSIS — A5901 Trichomonal vulvovaginitis: Secondary | ICD-10-CM

## 2013-08-29 LAB — BASIC METABOLIC PANEL
Anion gap: 13 (ref 5–15)
BUN: 11 mg/dL (ref 6–23)
CO2: 26 mEq/L (ref 19–32)
Calcium: 10 mg/dL (ref 8.4–10.5)
Chloride: 100 mEq/L (ref 96–112)
Creatinine, Ser: 0.79 mg/dL (ref 0.50–1.10)
GFR calc Af Amer: >90 mL/min (ref >90)
GFR calc non Af Amer: >90 mL/min (ref >90)
Glucose, Bld: 305 mg/dL — ABNORMAL HIGH (ref 70–99)
Potassium: 4 mEq/L (ref 3.7–5.3)
Sodium: 139 mEq/L (ref 137–147)

## 2013-08-29 LAB — RAPID URINE DRUG SCREEN, HOSP PERFORMED
Amphetamines: NOT DETECTED
Barbiturates: NOT DETECTED
Benzodiazepines: NOT DETECTED
Cocaine: NOT DETECTED
Opiates: NOT DETECTED
Tetrahydrocannabinol: NOT DETECTED

## 2013-08-29 LAB — CBC WITH DIFFERENTIAL/PLATELET
Basophils Absolute: 0 10*3/uL (ref 0.0–0.1)
Basophils Relative: 0 % (ref 0–1)
Eosinophils Absolute: 0.1 10*3/uL (ref 0.0–0.7)
Eosinophils Relative: 1 % (ref 0–5)
HCT: 38.3 % (ref 36.0–46.0)
Hemoglobin: 13 g/dL (ref 12.0–15.0)
Lymphocytes Relative: 22 % (ref 12–46)
Lymphs Abs: 1.6 10*3/uL (ref 0.7–4.0)
MCH: 30.1 pg (ref 26.0–34.0)
MCHC: 33.9 g/dL (ref 30.0–36.0)
MCV: 88.7 fL (ref 78.0–100.0)
Monocytes Absolute: 0.4 10*3/uL (ref 0.1–1.0)
Monocytes Relative: 6 % (ref 3–12)
Neutro Abs: 5.1 10*3/uL (ref 1.7–7.7)
Neutrophils Relative %: 71 % (ref 43–77)
Platelets: 286 10*3/uL (ref 150–400)
RBC: 4.32 MIL/uL (ref 3.87–5.11)
RDW: 12.4 % (ref 11.5–15.5)
WBC: 7.2 10*3/uL (ref 4.0–10.5)

## 2013-08-29 LAB — URINALYSIS, ROUTINE W REFLEX MICROSCOPIC
Bilirubin Urine: NEGATIVE
Glucose, UA: >1000 mg/dL — AB
Hgb urine dipstick: NEGATIVE
Ketones, ur: NEGATIVE mg/dL
Leukocytes, UA: NEGATIVE
Nitrite: NEGATIVE
Protein, ur: NEGATIVE mg/dL
Specific Gravity, Urine: 1.038 — ABNORMAL HIGH (ref 1.005–1.030)
Urobilinogen, UA: 0.2 mg/dL (ref 0.0–1.0)
pH: 5 (ref 5.0–8.0)

## 2013-08-29 LAB — ETHANOL: Alcohol, Ethyl (B): <11 mg/dL (ref 0–11)

## 2013-08-29 LAB — CBG MONITORING, ED: Glucose-Capillary: 262 mg/dL — ABNORMAL HIGH (ref 70–99)

## 2013-08-29 LAB — URINE MICROSCOPIC-ADD ON

## 2013-08-29 MED ORDER — ACETAMINOPHEN 325 MG PO TABS: 650.0000 mg | ORAL_TABLET | ORAL | Status: DC | PRN

## 2013-08-29 MED ORDER — LORAZEPAM 1 MG PO TABS: 1.0000 mg | ORAL_TABLET | Freq: Three times a day (TID) | ORAL | Status: DC | PRN

## 2013-08-29 MED ORDER — ONDANSETRON HCL 4 MG PO TABS: 4.0000 mg | ORAL_TABLET | Freq: Three times a day (TID) | ORAL | Status: DC | PRN

## 2013-08-29 MED ORDER — ALUM & MAG HYDROXIDE-SIMETH 200-200-20 MG/5ML PO SUSP: 30.0000 mL | ORAL | Status: DC | PRN

## 2013-08-29 MED ORDER — IBUPROFEN 200 MG PO TABS: 600.0000 mg | ORAL_TABLET | Freq: Three times a day (TID) | ORAL | Status: DC | PRN
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
## 2013-08-29 NOTE — ED Notes (Signed)
Pt growled at this RN when I attempted to get vital signs.  Myself and [name] reassured the Pt that it was ok and I was not get to hurt her.  Pt stated "they told me that you're going to hurt me.  I can't let you do that."  Pt asked who "they" are and Pt responded "my friends."  Then, the Pt began having a conversation w/ the empty chair beside of her, asking "is it ok if she does this?  She said that she won't hurt me."

## 2013-08-29 NOTE — ED Notes (Signed)
Pt is currently changing clothes and was given a comb.
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
## 2013-08-29 NOTE — ED Notes (Signed)
PA at bedside discussing lab draw.

## 2013-08-29 NOTE — ED Notes (Signed)
Patient refused blood draw for labs. RN made aware

## 2013-08-29 NOTE — ED Notes (Signed)
Pt yelled at Pharmacy Tech "get away from me" and she is now growling in the room.  [name] remains at bedside.

## 2013-08-29 NOTE — ED Notes (Signed)
Patient presents calm and cooperative. States that she was very angry when she arrived at the ED but she feels a lot better now; denies any current thoughts of SI/HI/AVH.

## 2013-08-29 NOTE — BH Assessment (Signed)
Spoke with Dalia Heading, PA-C who reported that patient has a history of schizophrenia and bipolar. Pt verbalized suicidal ideations to family and police officers. Pt pulled out a knife and threaten to kill herself. Assessment will be initiated.

## 2013-08-30 ENCOUNTER — Encounter (HOSPITAL_COMMUNITY): Payer: Self-pay | Admitting: Psychiatry

## 2013-08-30 DIAGNOSIS — F29 Unspecified psychosis not due to a substance or known physiological condition: Secondary | ICD-10-CM | POA: Insufficient documentation

## 2013-08-30 DIAGNOSIS — F259 Schizoaffective disorder, unspecified: Principal | ICD-10-CM

## 2013-08-30 DIAGNOSIS — R45851 Suicidal ideations: Secondary | ICD-10-CM

## 2013-08-30 MED ORDER — TRAZODONE HCL 100 MG PO TABS
100.0000 mg | ORAL_TABLET | Freq: Every day | ORAL | Status: DC
  Administered 2013-08-30: 100 mg via ORAL
  Filled 2013-08-30: qty 14
  Filled 2013-08-30: qty 1
  Filled 2013-08-30: qty 14
  Filled 2013-08-30 (×4): qty 1

## 2013-08-30 MED ORDER — ACETAMINOPHEN 325 MG PO TABS: 650.0000 mg | ORAL_TABLET | Freq: Four times a day (QID) | ORAL | Status: DC | PRN

## 2013-08-30 MED ORDER — ALUM & MAG HYDROXIDE-SIMETH 200-200-20 MG/5ML PO SUSP: 30.0000 mL | ORAL | Status: DC | PRN

## 2013-08-30 MED ORDER — QUETIAPINE FUMARATE 100 MG PO TABS
100.0000 mg | ORAL_TABLET | Freq: Every day | ORAL | Status: DC
  Administered 2013-08-30 – 2013-08-31 (×2): 100 mg via ORAL
  Filled 2013-08-30 (×3): qty 1

## 2013-08-30 MED ORDER — OLANZAPINE 5 MG PO TABS
5.0000 mg | ORAL_TABLET | Freq: Three times a day (TID) | ORAL | Status: DC | PRN
Start: 2013-08-30 — End: 2013-09-03

## 2013-08-30 MED ORDER — BUSPIRONE HCL 10 MG PO TABS
10.0000 mg | ORAL_TABLET | Freq: Two times a day (BID) | ORAL | Status: DC
  Administered 2013-08-30 – 2013-09-03 (×9): 10 mg via ORAL
  Filled 2013-08-30: qty 1
  Filled 2013-08-30: qty 28
  Filled 2013-08-30 (×5): qty 1
  Filled 2013-08-30: qty 28
  Filled 2013-08-30 (×2): qty 2
  Filled 2013-08-30 (×2): qty 1
  Filled 2013-08-30: qty 28
  Filled 2013-08-30 (×3): qty 1
  Filled 2013-08-30: qty 28

## 2013-08-30 MED ORDER — SIMVASTATIN 20 MG PO TABS
20.0000 mg | ORAL_TABLET | Freq: Every day | ORAL | Status: DC
  Administered 2013-08-30 – 2013-09-02 (×4): 20 mg via ORAL
  Filled 2013-08-30 (×6): qty 1

## 2013-08-30 MED ORDER — HYDROXYZINE HCL 25 MG PO TABS: 25.0000 mg | ORAL_TABLET | Freq: Four times a day (QID) | ORAL | Status: DC | PRN

## 2013-08-30 MED ORDER — TRAZODONE HCL 50 MG PO TABS
50.0000 mg | ORAL_TABLET | Freq: Every evening | ORAL | Status: DC | PRN
  Filled 2013-08-30 (×2): qty 1

## 2013-08-30 MED ORDER — MAGNESIUM HYDROXIDE 400 MG/5ML PO SUSP: 30.0000 mL | Freq: Every day | ORAL | Status: DC | PRN

## 2013-08-30 MED ORDER — SERTRALINE HCL 50 MG PO TABS
50.0000 mg | ORAL_TABLET | Freq: Every day | ORAL | Status: DC
Start: 2013-08-30 — End: 2013-09-01
  Administered 2013-08-30 – 2013-09-01 (×3): 50 mg via ORAL
  Filled 2013-08-30 (×5): qty 1

## 2013-08-30 MED ORDER — LISINOPRIL 10 MG PO TABS
10.0000 mg | ORAL_TABLET | Freq: Every day | ORAL | Status: DC
  Administered 2013-08-30 – 2013-08-31 (×2): 10 mg via ORAL
  Filled 2013-08-30 (×2): qty 1
  Filled 2013-08-30: qty 2
  Filled 2013-08-30 (×2): qty 1

## 2013-08-30 NOTE — Progress Notes (Signed)
Patient has been interacting appropriately on the unit. Attended group. Calm and cooperative. Denies SI and HI at this time. Pleasant mood. Bright affect. Voiced concerns about getting sleep medication this evening because she said she did not rest well the night before. Denies A/V hallucinations at this time. Safety maintained. Libby Maw, RN

## 2013-08-30 NOTE — BHH Group Notes (Signed)
[facility] LCSW Aftercare Discharge Planning Group Note   08/30/2013 11:20 AM  Participation Quality:  Did not attend    Lorraine Turner
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
## 2013-08-30 NOTE — BHH Group Notes (Signed)
[facility] LCSW Group Therapy  08/30/2013 1:15 pm  Type of Therapy: Process Group Therapy  Participation Level:  Active  Participation Quality:  Appropriate  Affect:  Flat  Cognitive:  Oriented  Insight:  Improving  Engagement in Group:  Limited  Engagement in Therapy:  Limited  Modes of Intervention:  Activity, Clarification, Education, Problem-solving and Support  Summary of Progress/Problems: Today's group addressed the issue of overcoming obstacles.  Patients were asked to identify their biggest obstacle post d/c that stands in the way of their on-going success, and then problem solve as to how to manage this.  Lorraine Turner came in late.  Minimal interaction initially, but then began giving feedback to others.  She was fairly quickly shut down as she was telling alcoholics to just pour the bottle down the toilet.  As for herself, she talked about how angry and agitated she has gotten with others.  "I think they are talking about me."  I told her how great it was that she got herself here so we could get her back on meds.  She seemed pleased with this.  Lorraine Turner 08/30/2013   3:28 PM

## 2013-08-30 NOTE — Progress Notes (Signed)
Patient ID: Lorraine Turner, female   DOB: 05-15-1960, 53 y.o.   MRN: [id number]  Patient was given to me via [name] at 55.

## 2013-08-30 NOTE — Tx Team (Signed)
Initial Interdisciplinary Treatment Plan  PATIENT STRENGTHS: (choose at least two) Ability for insight Motivation for treatment/growth  PATIENT STRESSORS: Financial difficulties Loss of job Marital or family conflict   PROBLEM LIST: Problem List/Patient Goals Date to be addressed Date deferred Reason deferred Estimated date of resolution  Depression 08/29/2013     Anxiety 08/29/2013     SI 08/29/2013     Auditory hallucinations 08/29/2013                                    DISCHARGE CRITERIA:  Ability to meet basic life and health needs Adequate post-discharge living arrangements Improved stabilization in mood, thinking, and/or behavior Need for constant or close observation no longer present Reduction of life-threatening or endangering symptoms to within safe limits Safe-care adequate arrangements made  PRELIMINARY DISCHARGE PLAN: Return to previous living arrangement  PATIENT/FAMIILY INVOLVEMENT: This treatment plan has been presented to and reviewed with the patient, ABA MATHISON, and/or family member.  The patient and family have been given the opportunity to ask questions and make suggestions.  Aurora Mask 08/30/2013, 4:25 AM

## 2013-08-30 NOTE — Progress Notes (Signed)
Patient ID: Lorraine Turner, female   DOB: Mar 06, 1960, 53 y.o.   MRN: [id number]   Patient was seen in the dayroom after her vitals were taken by MHT. Patient brightened upon approach from Probation officer. Writer was given scheduled medications per physician's orders. Patient verbalized understanding of her medications and had no questions. Writer educated patient on ways to remind herself of taking her medications after discharge and the importance of taking medications everyday. Writer stated that patient could mark on a calendar or place a reminder in her phone. Patient reported that she will try that. Patient is seen in the milieu and speaking with peers. Support and encouragment offered to patient. Q15 minute safety checks are maintained. Patient is safe at this time.
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
## 2013-08-30 NOTE — Progress Notes (Signed)
Patient admitted voluntarily for si with a plan to kill herself with a knife. Patient reports that she has a lot going on now and it's stressing her out. Her stressors reported are job loss, finances, her boyfriend and her 3 kids. She reports that she has been depressed and isolative at home. She reports that she had some auditory hallucinations earlier when she became very upset and angry before coming here. She was pleasant and cooperative during the admission. Medical hx include HTN, diabetes, anxiety, depression, bipolar and schizophrenia. She currently denies si/hi/a/v hallucinations. Safety maintained on unit and 15 min checks are in place.

## 2013-08-30 NOTE — [facility] Suicide Risk Assessment (Signed)
   Nursing information obtained from:    Demographic factors:  Low socioeconomic status;Unemployed Current Mental Status:  NA Loss Factors:  Financial problems / change in socioeconomic status Historical Factors:  Family history of suicide;Family history of mental illness or substance abuse Risk Reduction Factors:  Responsible for children under 53 years of age;Sense of responsibility to family;Living with another person, especially a relative;Positive therapeutic relationship Total Time spent with patient: 30 minutes  CLINICAL FACTORS:   Severe Anxiety and/or Agitation Bipolar Disorder:   Depressive phase Depression:   Anhedonia Delusional Hopelessness Insomnia Currently Psychotic  Psychiatric Specialty Exam: Physical Exam  Psychiatric: Judgment normal. Her mood appears anxious. Her speech is delayed. She is slowed and withdrawn. Thought content is paranoid. Cognition and memory are normal. She exhibits a depressed mood.    Review of Systems  Constitutional: Negative.   HENT: Negative.   Eyes: Negative.   Respiratory: Negative.   Cardiovascular: Negative.   Gastrointestinal: Negative.   Genitourinary: Negative.   Musculoskeletal: Negative.   Skin: Negative.   Neurological: Negative.   Endo/Heme/Allergies: Negative.   Psychiatric/Behavioral: Positive for depression and suicidal ideas. The patient is nervous/anxious and has insomnia.     Blood pressure 173/74, pulse 57, temperature 98.6 F (37 C), temperature source Oral, resp. rate 16, height 5\' 2"  (1.575 m), weight 68.493 kg (151 lb).Body mass index is 27.61 kg/(m^2).  General Appearance: Disheveled  Eye Contact::  Minimal  Speech:  Slow  Volume:  Decreased  Mood:  Anxious and Depressed  Affect:  Constricted  Thought Process:  Circumstantial  Orientation:  Full (Time, Place, and Person)  Thought Content:  Paranoid Ideation  Suicidal Thoughts:  Yes.  without intent/plan  Homicidal Thoughts:  No  Memory:  Immediate;    Fair Recent;   Fair Remote;   Fair  Judgement:  Impaired  Insight:  Lacking  Psychomotor Activity:  Decreased  Concentration:  Fair  Recall:  [employer] of Knowledge:Fair  Language: Good  Akathisia:  No  Handed:  Right  AIMS (if indicated):     Assets:  Communication Skills Desire for Improvement Physical Health  Sleep:  Number of Hours: 4   Musculoskeletal: Strength & Muscle Tone: within normal limits Gait & Station: normal Patient leans: N/A  COGNITIVE FEATURES THAT CONTRIBUTE TO RISK:  Closed-mindedness Polarized thinking    SUICIDE RISK:   Mild:  Suicidal ideation of limited frequency, intensity, duration, and specificity.  There are no identifiable plans, no associated intent, mild dysphoria and related symptoms, good self-control (both objective and subjective assessment), few other risk factors, and identifiable protective factors, including available and accessible social support.  PLAN OF CARE:1. Admit for crisis management and stabilization. 2. Medication management to reduce current symptoms to base line and improve the     patient's overall level of functioning 3. Treat health problems as indicated. 4. Develop treatment plan to decrease risk of relapse upon discharge and the need for     readmission. 5. Psycho-social education regarding relapse prevention and self care. 6. Health care follow up as needed for medical problems. 7. Restart home medications where appropriate.   I certify that inpatient services furnished can reasonably be expected to improve the patient's condition.  [name], MD 08/30/2013, 10:09 AM

## 2013-08-30 NOTE — Progress Notes (Signed)
The focus of this group is to help patients review their daily goal of treatment and discuss progress on daily workbooks. Pt declined to attend the evening group.

## 2013-08-30 NOTE — Progress Notes (Signed)
NUTRITION ASSESSMENT  Patient requested a consult secondary to DM.  INTERVENTION: 1. Educated patient on the importance of nutrition and encouraged intake of food and beverages.  Reviewed DM diet guidelines.  Teach back method used.  AND DM handout and plate method sheet provided. 2. Discussed weight goals.  Discussed that weight gain would be unhealthy. 3. Supplements:  None at this time.  NUTRITION DIAGNOSIS: Unintentional weight loss related to sub-optimal intake as evidenced by pt report.   Goal: Pt to meet >/= 90% of their estimated nutrition needs.  Monitor:  PO intake  Assessment:  Patient admitted with SI and auditory hallucinations.  Hx includes schizophrenia, bipolar, HTN, DM.  Patient approached me on unit and asked for DM education.  Patient reports that she used to be a patient at [facility] and went to all of the diabetic classes there.  She has not had a recent HgbA1C.    States that she was taking her DM meds at home but had been skipping her psychiatric medications.  Stated that she had started a habit of drinking regular soda.  States that she wants to gain weight and does not like her body.    53 y.o. female  Height: Ht Readings from Last 1 Encounters:  08/29/13 5\' 2"  (1.575 m)    Weight: Wt Readings from Last 1 Encounters:  08/29/13 151 lb (68.493 kg)    Weight Hx: Wt Readings from Last 10 Encounters:  08/29/13 151 lb (68.493 kg)  08/14/13 155 lb (70.308 kg)  08/14/11 158 lb (71.668 kg)  07/20/09 169 lb (76.658 kg)  12/29/08 173 lb 4.8 oz (78.608 kg)  10/28/08 167 lb 8 oz (75.978 kg)  10/12/08 171 lb (77.565 kg)  11/13/07 179 lb (81.194 kg)  07/09/07 179 lb 8 oz (81.421 kg)  01/01/07 161 lb (73.029 kg)    BMI:  Body mass index is 27.61 kg/(m^2). Pt meets criteria for overweight based on current BMI.  Estimated Nutritional Needs: Kcal: 25-30 kcal/kg Protein: > 1 gram protein/kg Fluid: 1 ml/kcal  Diet Order: General Pt is also offered  choice of unit snacks mid-morning and mid-afternoon.  Pt is eating as desired.   Lab results and medications reviewed.   Antonieta Iba, RD, LDN Clinical Inpatient Dietitian Pager:  [phone] Weekend and after hours pager:  [phone]

## 2013-08-31 DIAGNOSIS — F3289 Other specified depressive episodes: Secondary | ICD-10-CM

## 2013-08-31 DIAGNOSIS — F329 Major depressive disorder, single episode, unspecified: Secondary | ICD-10-CM

## 2013-08-31 LAB — GLUCOSE, CAPILLARY: GLUCOSE-CAPILLARY: 247 mg/dL — AB (ref 70–99)

## 2013-08-31 MED ORDER — VITAMIN D3 25 MCG (1000 UNIT) PO TABS
1000.0000 [IU] | ORAL_TABLET | Freq: Two times a day (BID) | ORAL | Status: DC
  Administered 2013-09-01 – 2013-09-03 (×5): 1000 [IU] via ORAL
  Filled 2013-08-31 (×10): qty 1

## 2013-08-31 MED ORDER — INSULIN ASPART 100 UNIT/ML ~~LOC~~ SOLN
0.0000 [IU] | Freq: Three times a day (TID) | SUBCUTANEOUS | Status: DC
Start: 2013-09-01 — End: 2013-09-03
  Administered 2013-09-01: 8 [IU] via SUBCUTANEOUS
  Administered 2013-09-01: 3 [IU] via SUBCUTANEOUS
  Administered 2013-09-01: 2 [IU] via SUBCUTANEOUS
  Administered 2013-09-02: 5 [IU] via SUBCUTANEOUS
  Administered 2013-09-02: 3 [IU] via SUBCUTANEOUS
  Administered 2013-09-03: 5 [IU] via SUBCUTANEOUS

## 2013-08-31 MED ORDER — METFORMIN HCL 500 MG PO TABS
1000.0000 mg | ORAL_TABLET | Freq: Two times a day (BID) | ORAL | Status: DC
  Administered 2013-09-01 – 2013-09-03 (×5): 1000 mg via ORAL
  Filled 2013-08-31 (×10): qty 2

## 2013-08-31 MED ORDER — LINAGLIPTIN 5 MG PO TABS
5.0000 mg | ORAL_TABLET | Freq: Every day | ORAL | Status: DC
  Administered 2013-09-01 – 2013-09-03 (×3): 5 mg via ORAL
  Filled 2013-08-31 (×6): qty 1

## 2013-08-31 MED ORDER — LISINOPRIL 5 MG PO TABS
5.0000 mg | ORAL_TABLET | Freq: Every day | ORAL | Status: DC
  Administered 2013-09-01 – 2013-09-03 (×3): 5 mg via ORAL
  Filled 2013-08-31 (×6): qty 1

## 2013-08-31 MED ORDER — INSULIN ASPART 100 UNIT/ML ~~LOC~~ SOLN
0.0000 [IU] | Freq: Every day | SUBCUTANEOUS | Status: DC
  Administered 2013-09-01 – 2013-09-02 (×2): 2 [IU] via SUBCUTANEOUS

## 2013-08-31 MED ORDER — GLYBURIDE 5 MG PO TABS
5.0000 mg | ORAL_TABLET | Freq: Every day | ORAL | Status: DC
  Administered 2013-09-01 – 2013-09-03 (×3): 5 mg via ORAL
  Filled 2013-08-31 (×6): qty 1

## 2013-08-31 MED ORDER — SITAGLIPTIN PHOS-METFORMIN HCL 50-1000 MG PO TABS: 1.0000 | ORAL_TABLET | Freq: Two times a day (BID) | ORAL | Status: DC

## 2013-08-31 NOTE — Progress Notes (Signed)
Adult Psychoeducational Group Note  Date:  08/31/2013 Time:  9:26 PM  Group Topic/Focus:  Wrap-Up Group:   The focus of this group is to help patients review their daily goal of treatment and discuss progress on daily workbooks.  Participation Level:  Minimal  Participation Quality:  Appropriate  Affect:  Appropriate  Cognitive:  Appropriate  Insight: Good  Engagement in Group:  Engaged  Modes of Intervention:  Socialization and Support  Additional Comments:  Patient attended and participated in group tonight. She reports that today she was sleepy and feeling dormant. She advised that she may have gotten too much medication the night before. She has been sleepy all day and that is not like her. She stayed isolated to her room, but was not depressed, just sleepy. For her recovery she plans to seek counseling, seek employment and follow through with her plans to go back to school.  Salley Scarlet [facility] 08/31/2013, 9:26 PM
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
## 2013-08-31 NOTE — [facility] Counselor (Signed)
Adult Comprehensive Assessment  Patient ID: Lorraine Turner, female   DOB: Jun 20, 1960, 53 y.o.   MRN: [id number]  Information Source: Information source: Patient  Current Stressors:  Employment / Job issues: Unemployed Family Relationships: She and husband are separated Museum/gallery curator / Lack of resources (include bankruptcy): Is getting a small check-$180.00-and some money for the two children that are still living at home Housing / Lack of housing: Is able to pay rent  Had to ask [employer] for help with utilities last month Physical health (include injuries & life threatening diseases): Diabetes, HTN  Living/Environment/Situation:  Living Arrangements: Children Living conditions (as described by patient or guardian): 2 youngest children go back and forth between me and their father  We have a nice place together How long has patient lived in current situation?: 2 years What is atmosphere in current home: Chaotic;Supportive;Loving  Family History:  Marital status: Separated Separated, when?: 1 yr  15 mos What types of issues is patient dealing with in the relationship?: We share the kids-that works OK.  Additional relationship information: He has hygiene issues Does patient have children?: Yes How many children?: 5 How is patient's relationship with their children?: good  Childhood History:  By whom was/is the patient raised?: Both parents Description of patient's relationship with caregiver when they were a child: good Patient's description of current relationship with people who raised him/her: both deceased Does patient have siblings?: No Did patient suffer any verbal/emotional/physical/sexual abuse as a child?: No Did patient suffer from severe childhood neglect?: No Has patient ever been sexually abused/assaulted/raped as an adolescent or adult?: No Was the patient ever a victim of a crime or a disaster?: No Witnessed domestic violence?: No  Education:  Highest grade of school  patient has completed: 2 year certificate following college Currently a student?: No Learning disability?: No  Employment/Work Situation:   Employment situation: Unemployed What is the longest time patient has a held a job?: 9 years Where was the patient employed at that time?: [employer] service Has patient ever been in the [employer]?: No Has patient ever served in Recruitment consultant?: No  Financial Resources:   Museum/gallery curator resources: [employer];Food stamps;Income from spouse (180 a month) Does patient have a representative payee or guardian?: No  Alcohol/Substance Abuse:   Alcohol/Substance Abuse Treatment Hx: Denies past history Has alcohol/substance abuse ever caused legal problems?: No  Social Support System:   Pensions consultant Support System: Good Describe Community Support System: children Type of faith/religion: LDS How does patient's faith help to cope with current illness?: "Keeps me hopeful"  Leisure/Recreation:   Leisure and Hobbies: crochet, reading  Strengths/Needs:   What things does the patient do well?: help others, friendly, outgoing In what areas does patient struggle / problems for patient: procrastination, inflexible  Discharge Plan:   Does patient have access to transportation?: Yes Will patient be returning to same living situation after discharge?: Yes Currently receiving community mental health services: Yes (From Whom) ([facility])  Summary/Recommendations:   Summary and Recommendations (to be completed by the evaluator): Lorraine Turner is a 53 YO AA female who was experiencing symptoms of psychosis and depression when she became stressed after stopping her medications a couple of months ago. Primary stressor is financial.  She states she missed an interview with [employer] when she came in here, and she is counting on being able to work with them when she gets out.  "That is the one thing giving me hope that things can get a little better."  She can benefit  from crises stabilization, medication managment, therapeutic milieu and referral for services.  Roque Lias B. 08/31/2013

## 2013-08-31 NOTE — [facility] Group Notes (Addendum)
[facility] LCSW Group Therapy  08/31/2013 , 12:45 PM   Type of Therapy:  Group Therapy  Participation Level: Did not attend  Summary of Progress/Problems: Today's group focused on the term Diagnosis.  Participants were asked to define the term, and then pronounce whether it is a negative, positive or neutral term.  Lorraine Turner B 08/31/2013 , 12:45 PM

## 2013-08-31 NOTE — Progress Notes (Signed)
CBG 247. RN notified NP for need of medication orders. Awaiting orders now. Will continue to monitor patient for safety.

## 2013-08-31 NOTE — Progress Notes (Signed)
D: Patient has a depressed mood and affect. Patient denies any SI/HI and auditory and visual hallucinations at this time. Patient interacting appropriately within the milieu but prefers to isolate to her room throughout the day. The patient reports that she is "feeling better today" but that she is worried "the voices might come back." Patient's CBG obtained (247) this morning.  A: Patient given emotional support from RN. Patient encouraged to come to staff with concerns and/or questions. Patient's medication routine continued. Patient's orders and plan of care reviewed. MD notified about patient concerns. NP notified about patient's CBG and need for medication.  R: Patient remains appropriate and cooperative. Will continue to monitor patient q15 minutes for safety.

## 2013-09-01 LAB — GLUCOSE, CAPILLARY
GLUCOSE-CAPILLARY: 238 mg/dL — AB (ref 70–99)
Glucose-Capillary: 273 mg/dL — ABNORMAL HIGH (ref 70–99)
Glucose-Capillary: 180 mg/dL — ABNORMAL HIGH (ref 70–99)
Glucose-Capillary: 145 mg/dL — ABNORMAL HIGH (ref 70–99)

## 2013-09-01 MED ORDER — SERTRALINE HCL 100 MG PO TABS
100.0000 mg | ORAL_TABLET | Freq: Every day | ORAL | Status: DC
  Administered 2013-09-02 – 2013-09-03 (×2): 100 mg via ORAL
  Filled 2013-09-01: qty 1
  Filled 2013-09-01: qty 14
  Filled 2013-09-01: qty 1
  Filled 2013-09-01: qty 14
  Filled 2013-09-01: qty 1

## 2013-09-01 MED ORDER — QUETIAPINE FUMARATE 200 MG PO TABS
200.0000 mg | ORAL_TABLET | Freq: Every day | ORAL | Status: DC
  Administered 2013-09-01 – 2013-09-02 (×2): 200 mg via ORAL
  Filled 2013-09-01: qty 1
  Filled 2013-09-01 (×2): qty 14
  Filled 2013-09-01 (×2): qty 1

## 2013-09-01 NOTE — Progress Notes (Signed)
D: pt denies si/hi/avh and pain. Pt calm and appropriate on unit. Pt asking about her diabetic medications and vitamin D. PA notified.  A: orders placed for diabetic medications. Support and encouragement offered. scheduled medications given. q 15 min safety checks R: pt remains safe on unit. No signs of distress noted.

## 2013-09-01 NOTE — Progress Notes (Signed)
Patient ID: Lorraine Turner, female   DOB: [birth year], 53 y.o.   MRN: [id number] [facility] MD Progress Note  09/01/2013 10:49 AM Lorraine Turner  MRN:  [id number] Subjective: Patient states: "It is good to be back on my medications, my stress level is reduced.''   History of Present Illness:: Patient seen and chart reviewed. Patient reports decreasing suicidal thoughts, anxiety, paranoia and depressive symptoms. Patient is endorsing good sleep due to decreased worries and racing thoughts. Patient has been compliant with her medications and the unit milieu. She has not endorsed any adverse reactions to her medications.  Diagnosis:   DSM5: Schizophrenia Disorders: Delusional Disorder (297.1)  Obsessive-Compulsive Disorders: anxiety  Trauma-Stressor Disorders:  Substance/Addictive Disorders:  Depressive Disorders: Major Depressive Disorder - with Psychotic Features (296.24)   AXIS I: Schizoaffective disorder, depressive type  AXIS II: Deferred  AXIS III:  Past Medical History   Diagnosis  Date   .  Hypertension    .  Diabetes mellitus    AXIS IV: other psychosocial or environmental problems and problems related to social environment  AXIS V: 21-30 behavior considerably influenced by delusions or hallucinations OR serious impairment in judgment, communication OR inability to function in almost all areas   ADL's:  Intact  Sleep: Good  Appetite:  Good  Suicidal Ideation: passive  Homicidal Ideation:  Denies AEB (as evidenced by):  Psychiatric Specialty Exam: Physical Exam  Psychiatric: Her speech is normal and behavior is normal. Judgment normal. Her mood appears anxious. Thought content is paranoid. Cognition and memory are normal. She exhibits a depressed mood.    Review of Systems  Constitutional: Negative.   HENT: Negative.   Eyes: Negative.   Respiratory: Negative.   Cardiovascular: Negative.   Gastrointestinal: Negative.   Genitourinary: Negative.   Musculoskeletal:  Negative.   Skin: Negative.   Neurological: Negative.   Endo/Heme/Allergies: Negative.   Psychiatric/Behavioral: Positive for depression. The patient is nervous/anxious.     Blood pressure 132/70, pulse 65, temperature 97.5 F (36.4 C), temperature source Oral, resp. rate 20, height 5\' 2"  (1.575 m), weight 68.493 kg (151 lb).Body mass index is 27.61 kg/(m^2).   General Appearance: Fairly Groomed   Engineer, water:: Minimal   Speech: Slow   Volume: Decreased   Mood: Depressed and Dysphoric   Affect: Constricted   Thought Process: Circumstantial   Orientation: Full (Time, Place, and Person)   Thought Content: Paranoid Ideation   Suicidal Thoughts: passive  Homicidal Thoughts: No   Memory: Immediate; Fair  Recent; Fair  Remote; Fair   Judgement: Impaired   Insight: Lacking   Psychomotor Activity: Decreased   Concentration: Fair   Recall: [employer]: Fair   Akathisia: No   Handed: Right   AIMS (if indicated):   Assets: Communication Skills  Desire for Improvement  Physical Health   Sleep: Number of Hours: 4    Musculoskeletal:  Strength & Muscle Tone: within normal limits  Gait & Station: normal  Patient leans: N/A   Current Medications: Current Facility-Administered Medications  Medication Dose Route Frequency Provider Last Rate Last Dose  . acetaminophen (TYLENOL) tablet 650 mg  650 mg Oral Q6H PRN Benjamine Mola, FNP      . alum & mag hydroxide-simeth (MAALOX/MYLANTA) 200-200-20 MG/5ML suspension 30 mL  30 mL Oral Q4H PRN Benjamine Mola, FNP      . busPIRone (BUSPAR) tablet 10 mg  10 mg Oral BID Mariangel Ringley   10 mg at  09/01/13 0740  . cholecalciferol (VITAMIN D) tablet 1,000 Units  1,000 Units Oral BID Laverle Hobby, PA-C   1,000 Units at 09/01/13 0740  . glyBURIDE (DIABETA) tablet 5 mg  5 mg Oral Q breakfast Laverle Hobby, PA-C   5 mg at 09/01/13 0740  . hydrOXYzine (ATARAX/VISTARIL) tablet 25 mg  25 mg Oral Q6H PRN Benjamine Mola,  FNP      . insulin aspart (novoLOG) injection 0-15 Units  0-15 Units Subcutaneous TID WC Laverle Hobby, PA-C   8 Units at 09/01/13 [phone]  . insulin aspart (novoLOG) injection 0-5 Units  0-5 Units Subcutaneous QHS Maurine Minister Simon, PA-C      . linagliptin (TRADJENTA) tablet 5 mg  5 mg Oral Daily Christophor Eick   5 mg at 09/01/13 0740   And  . metFORMIN (GLUCOPHAGE) tablet 1,000 mg  1,000 mg Oral BID WC Clarrisa Kaylor   1,000 mg at 09/01/13 0740  . lisinopril (PRINIVIL,ZESTRIL) tablet 5 mg  5 mg Oral Daily Laverle Hobby, PA-C   5 mg at 09/01/13 0740  . magnesium hydroxide (MILK OF MAGNESIA) suspension 30 mL  30 mL Oral Daily PRN Benjamine Mola, FNP      . OLANZapine (ZYPREXA) tablet 5 mg  5 mg Oral Q8H PRN Benjamine Mola, FNP      . QUEtiapine (SEROQUEL) tablet 200 mg  200 mg Oral QHS Brynlie Daza      . [START ON 09/02/2013] sertraline (ZOLOFT) tablet 100 mg  100 mg Oral Daily Jules Baty      . simvastatin (ZOCOR) tablet 20 mg  20 mg Oral q1800 Tyrome Donatelli   20 mg at 08/31/13 1713  . traZODone (DESYREL) tablet 100 mg  100 mg Oral QHS Deeanne Deininger   100 mg at 08/30/13 2215    Lab Results:  Results for orders placed during the hospital encounter of 08/29/13 (from the past 48 hour(s))  GLUCOSE, CAPILLARY     Status: Abnormal   Collection Time    08/31/13 11:30 AM      Result Value Ref Range   Glucose-Capillary 247 (*) 70 - 99 mg/dL   Comment 1 Documented in Chart     Comment 2 Notify RN    GLUCOSE, CAPILLARY     Status: Abnormal   Collection Time    09/01/13  6:09 AM      Result Value Ref Range   Glucose-Capillary 273 (*) 70 - 99 mg/dL    Physical Findings: AIMS: Facial and Oral Movements Muscles of Facial Expression: None, normal Lips and Perioral Area: None, normal Jaw: None, normal Tongue: None, normal,Extremity Movements Upper (arms, wrists, hands, fingers): None, normal Lower (legs, knees, ankles, toes): None, normal, Trunk Movements Neck, shoulders, hips:  None, normal, Overall Severity Severity of abnormal movements (highest score from questions above): None, normal Incapacitation due to abnormal movements: None, normal Patient's awareness of abnormal movements (rate only patient's report): No Awareness, Dental Status Current problems with teeth and/or dentures?: No Does patient usually wear dentures?: No  CIWA:    COWS:     Treatment Plan Summary: Daily contact with patient to assess and evaluate symptoms and progress in treatment Medication management  Plan: Review of chart, vital signs, medications, and notes.  1-Individual and group therapy  2-Medication management for Schizoaffective disorder: -Increase Seroquel to 200mg  po qhs for delusions/mood -Increase Zoloft to 100mg  for depression. -Continue Trazodone 100mg  po qhs for insomnia 3-Coping skills for depression, anxiety  4-Continue  crisis stabilization and management  5-Address health issues--monitoring vital signs, stable  6-Treatment plan in progress to prevent relapse of depression and anxiety  Medical Decision Making Problem Points:  Established problem, stable/improving (1), Review of last therapy session (1) and Review of psycho-social stressors (1) Data Points:  Review or order clinical lab tests (1) Review or order medicine tests (1) Review of new medications or change in dosage (2)  I certify that inpatient services furnished can reasonably be expected to improve the patient's condition.   [name], MD 09/01/2013, 10:49 AM

## 2013-09-01 NOTE — Progress Notes (Signed)
Adult Psychoeducational Group Note  Date:  09/01/2013 Time:  9:30 PM  Group Topic/Focus:  Wrap-Up Group:   The focus of this group is to help patients review their daily goal of treatment and discuss progress on daily workbooks.  Participation Level:  Active  Participation Quality:  Appropriate  Affect:  Appropriate  Cognitive:  Appropriate  Insight: Appropriate and Good  Engagement in Group:  Engaged  Modes of Intervention:  Socialization and Support  Additional Comments:  Patient attended and participated in group tonight. She reports having a good day. She was thankful to be able to go outside today, and to be around family. She went to her groups, went for her meals and got some rest. For her personal development she plans to stay on her medication, seek counseling, stay organized, and have a means of making money.  Lorraine Turner [facility] 09/01/2013, 9:30 PM

## 2013-09-01 NOTE — BHH Group Notes (Signed)
[facility] LCSW Aftercare Discharge Planning Group Note   09/01/2013 1:00 PM  Participation Quality:  Did not attend    Lorraine Turner

## 2013-09-01 NOTE — Progress Notes (Signed)
Patient ID: Lorraine Turner, female   DOB: 1960/10/13, 53 y.o.   MRN: [id number] In bed, eyes closed, appears to be sleeping. Respirations even and unlabored. 15 min check in place, safety maintained

## 2013-09-01 NOTE — Progress Notes (Signed)
D: Patient denies SI/HI and auditory and visual hallucinations. The patient has a depressed mood and affect. The patient states that her "thoughts are starting to calm down now" and denies any paranoia or racing thoughts at this time.  A: Patient given emotional support from RN. Patient encouraged to come to staff with concerns and/or questions. Patient's medication routine continued. Patient's orders and plan of care reviewed.  R: Patient remains appropriate and cooperative. Will continue to monitor patient q15 minutes for safety.

## 2013-09-01 NOTE — Progress Notes (Signed)
Adult Psychoeducational Group Note  Date:  09/01/2013 Time:  2:49 PM  Group Topic/Focus:  Personal Choices and Values:   The focus of this group is to help patients assess and explore the importance of values in their lives, how their values affect their decisions, how they express their values and what opposes their expression.  Participation Level:  Did Not Attend  Additional Comments:  Patient was informed that group was starting and encouraged to come.  Oralia Manis 09/01/2013, 2:49 PM

## 2013-09-01 NOTE — Progress Notes (Addendum)
D: Pt is flat in affect but brightens upon interaction. Pt is currently denying any SI/HI/AVH. Pt is contemplating on removing negative people out of her life. Pt is reporting no adverse effects from her medications. Pt is looking forward to future unit activities, such as karaoke on Thursday. She believes that the next two days will help her to ease into conformability of being discharged on Friday. Pt is actively participating within the milieu. Pt appropriately interacts with others.  A: Writer administered scheduled medications to pt. Continued support and availability as needed was extended to this pt. Staff continue to monitor pt with q16min checks.  R: No adverse drug reactions noted. Pt receptive to treatment. Pt remains safe at this time.

## 2013-09-02 LAB — GLUCOSE, CAPILLARY
GLUCOSE-CAPILLARY: 208 mg/dL — AB (ref 70–99)
Glucose-Capillary: 109 mg/dL — ABNORMAL HIGH (ref 70–99)
Glucose-Capillary: 153 mg/dL — ABNORMAL HIGH (ref 70–99)
Glucose-Capillary: 203 mg/dL — ABNORMAL HIGH (ref 70–99)

## 2013-09-02 NOTE — ED Provider Notes (Signed)
Medical screening examination/treatment/procedure(s) were performed by non-physician practitioner and as supervising physician I was immediately available for consultation/collaboration.   EKG Interpretation None       Virgel Manifold, MD 09/02/13 1321

## 2013-09-02 NOTE — Progress Notes (Signed)
Patient ID: Lorraine Turner, female   DOB: 06/18/1960, 53 y.o.   MRN: [id number] [facility] MD Progress Note  09/02/2013 11:15 AM Lorraine Turner  MRN:  [id number] Subjective: Patient states: "I am feeling much better today than yesterday.''   History of Present Illness:: Patient seen and her chart reviewed. Patient reports decreasing racing thoughts, worries, anxiety, paranoia and depressive symptoms. She denies suicidal/homicidal ideation, intent or plan. Patient is compliant with her medications and the unit milieu. She has not endorsed any adverse reactions to her medications.  Diagnosis:   DSM5: Schizophrenia Disorders: Delusional Disorder (297.1)  Obsessive-Compulsive Disorders: anxiety  Trauma-Stressor Disorders:  Substance/Addictive Disorders:  Depressive Disorders: Major Depressive Disorder - with Psychotic Features (296.24)   AXIS I: Schizoaffective disorder, depressive type  AXIS II: Deferred  AXIS III:  Past Medical History   Diagnosis  Date   .  Hypertension    .  Diabetes mellitus    AXIS IV: other psychosocial or environmental problems and problems related to social environment  AXIS V: 50-60 moderate symptoms.  ADL's:  Intact  Sleep: Good  Appetite:  Good  Suicidal Ideation: denies  Homicidal Ideation:  Denies AEB (as evidenced by):  Psychiatric Specialty Exam: Physical Exam  Psychiatric: Her speech is normal and behavior is normal. Judgment normal. Her mood appears anxious. Cognition and memory are normal.    Review of Systems  Constitutional: Negative.   HENT: Negative.   Eyes: Negative.   Respiratory: Negative.   Cardiovascular: Negative.   Gastrointestinal: Negative.   Genitourinary: Negative.   Musculoskeletal: Negative.   Skin: Negative.   Neurological: Negative.   Endo/Heme/Allergies: Negative.   Psychiatric/Behavioral: The patient is nervous/anxious.     Blood pressure 132/72, pulse 77, temperature 98 F (36.7 C), temperature source Oral,  resp. rate 20, height 5\' 2"  (1.575 m), weight 68.493 kg (151 lb).Body mass index is 27.61 kg/(m^2).   General Appearance: Fairly Groomed   Engineer, water:: Minimal   Speech: normal  Volume: Decreased   Mood: Dysphoric   Affect: bright  Thought Process: Circumstantial   Orientation: Full (Time, Place, and Person)   Thought Content: reality based  Suicidal Thoughts: denies  Homicidal Thoughts: No   Memory: Immediate; Fair  Recent; Fair  Remote; Fair   Judgement: marginal  Insight: marginal  Psychomotor Activity: Decreased   Concentration: Fair   Recall: [place]: Fair   Akathisia: No   Handed: Right   AIMS (if indicated):   Assets: Communication Skills  Desire for Improvement  Physical Health   Sleep: Number of Hours: 4    Musculoskeletal:  Strength & Muscle Tone: within normal limits  Gait & Station: normal  Patient leans: N/A   Current Medications: Current Facility-Administered Medications  Medication Dose Route Frequency Provider Last Rate Last Dose  . acetaminophen (TYLENOL) tablet 650 mg  650 mg Oral Q6H PRN Benjamine Mola, FNP      . alum & mag hydroxide-simeth (MAALOX/MYLANTA) 200-200-20 MG/5ML suspension 30 mL  30 mL Oral Q4H PRN Benjamine Mola, FNP      . busPIRone (BUSPAR) tablet 10 mg  10 mg Oral BID Mariya Mottley   10 mg at 09/02/13 [id number]  . cholecalciferol (VITAMIN D) tablet 1,000 Units  1,000 Units Oral BID Laverle Hobby, PA-C   1,000 Units at 09/02/13 [id number]  . glyBURIDE (DIABETA) tablet 5 mg  5 mg Oral Q breakfast Laverle Hobby, PA-C   5 mg at 09/02/13  [id number]  . hydrOXYzine (ATARAX/VISTARIL) tablet 25 mg  25 mg Oral Q6H PRN Benjamine Mola, FNP      . insulin aspart (novoLOG) injection 0-15 Units  0-15 Units Subcutaneous TID WC Laverle Hobby, PA-C   5 Units at 09/02/13 [phone]  . insulin aspart (novoLOG) injection 0-5 Units  0-5 Units Subcutaneous QHS Laverle Hobby, PA-C   2 Units at 09/01/13 2205  . linagliptin (TRADJENTA)  tablet 5 mg  5 mg Oral Daily Khan Chura   5 mg at 09/02/13 0809   And  . metFORMIN (GLUCOPHAGE) tablet 1,000 mg  1,000 mg Oral BID WC Meosha Castanon   1,000 mg at 09/02/13 0810  . lisinopril (PRINIVIL,ZESTRIL) tablet 5 mg  5 mg Oral Daily Laverle Hobby, PA-C   5 mg at 09/02/13 [id number]  . magnesium hydroxide (MILK OF MAGNESIA) suspension 30 mL  30 mL Oral Daily PRN Benjamine Mola, FNP      . OLANZapine (ZYPREXA) tablet 5 mg  5 mg Oral Q8H PRN Benjamine Mola, FNP      . QUEtiapine (SEROQUEL) tablet 200 mg  200 mg Oral QHS Reynaldo Rossman   200 mg at 09/01/13 2205  . sertraline (ZOLOFT) tablet 100 mg  100 mg Oral Daily Tennessee Perra   100 mg at 09/02/13 0811  . simvastatin (ZOCOR) tablet 20 mg  20 mg Oral q1800 Dajahnae Vondra   20 mg at 09/01/13 1655  . traZODone (DESYREL) tablet 100 mg  100 mg Oral QHS Aldair Rickel   100 mg at 08/30/13 2215    Lab Results:  Results for orders placed during the hospital encounter of 08/29/13 (from the past 48 hour(s))  GLUCOSE, CAPILLARY     Status: Abnormal   Collection Time    08/31/13 11:30 AM      Result Value Ref Range   Glucose-Capillary 247 (*) 70 - 99 mg/dL   Comment 1 Documented in Chart     Comment 2 Notify RN    GLUCOSE, CAPILLARY     Status: Abnormal   Collection Time    09/01/13  6:09 AM      Result Value Ref Range   Glucose-Capillary 273 (*) 70 - 99 mg/dL  GLUCOSE, CAPILLARY     Status: Abnormal   Collection Time    09/01/13 11:48 AM      Result Value Ref Range   Glucose-Capillary 180 (*) 70 - 99 mg/dL  GLUCOSE, CAPILLARY     Status: Abnormal   Collection Time    09/01/13  4:58 PM      Result Value Ref Range   Glucose-Capillary 145 (*) 70 - 99 mg/dL  GLUCOSE, CAPILLARY     Status: Abnormal   Collection Time    09/01/13  8:42 PM      Result Value Ref Range   Glucose-Capillary 238 (*) 70 - 99 mg/dL  GLUCOSE, CAPILLARY     Status: Abnormal   Collection Time    09/02/13  5:58 AM      Result Value Ref Range    Glucose-Capillary 208 (*) 70 - 99 mg/dL    Physical Findings: AIMS: Facial and Oral Movements Muscles of Facial Expression: None, normal Lips and Perioral Area: None, normal Jaw: None, normal Tongue: None, normal,Extremity Movements Upper (arms, wrists, hands, fingers): None, normal Lower (legs, knees, ankles, toes): None, normal, Trunk Movements Neck, shoulders, hips: None, normal, Overall Severity Severity of abnormal movements (highest score from questions above): None, normal Incapacitation due  to abnormal movements: None, normal Patient's awareness of abnormal movements (rate only patient's report): No Awareness, Dental Status Current problems with teeth and/or dentures?: No Does patient usually wear dentures?: No  CIWA:    COWS:     Treatment Plan Summary: Daily contact with patient to assess and evaluate symptoms and progress in treatment Medication management  Plan: Review of chart, vital signs, medications, and notes.  1-Individual and group therapy  2-Medication management for Schizoaffective disorder: -Continue Seroquel to 200mg  po qhs for delusions/mood -Continue  Zoloft to 100mg  for depression. -Continue Trazodone 100mg  po qhs for insomnia 3-Coping skills for depression, anxiety  4-Continue crisis stabilization and management  5-Address health issues--monitoring vital signs, stable  6-Treatment plan in progress to prevent relapse of depression and anxiety  Medical Decision Making Problem Points:  Established problem, stable/improving (1), Review of last therapy session (1) and Review of psycho-social stressors (1) Data Points:  Review or order clinical lab tests (1) Review or order medicine tests (1) Review of new medications or change in dosage (2)  I certify that inpatient services furnished can reasonably be expected to improve the patient's condition.   [name], MD 09/02/2013, 11:15 AM

## 2013-09-02 NOTE — BHH Group Notes (Signed)
[place] LCSW Group Therapy  09/02/2013 , 3:44 PM   Type of Therapy:  Group Therapy  Participation Level:  Active  Participation Quality:  Attentive  Affect:  Appropriate  Cognitive:  Alert  Insight:  Improving  Engagement in Therapy:  Engaged  Modes of Intervention:  Discussion, Exploration and Socialization  Summary of Progress/Problems: Today's group focused on the term Diagnosis.  Participants were asked to define the term, and then pronounce whether it is a negative, positive or neutral term.  Donne rattled off 4 or 5 different diagnosis that she carries, and referred to them proudly.  Later, when another pt brought up negative terms like "crazy" and "insane." Deerica talked about how these do not bother her.  "I know that I need help, and I tell people I come to the hospital to get help for my crazy self."  She did lament the fact that it gets lonely at times because she feels that there are those who avoid her if they know that she has a mental health diagnosis.  Roque Lias B 09/02/2013 , 3:44 PM

## 2013-09-02 NOTE — Progress Notes (Signed)
D: Pt verbalizing readiness for discharge. Pt plans to take her medications and follow-up with her appointments appropriately. 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 Pt is currently denying any SI/HI/AVH. Pt is pleasant and cooperative with treatment. A: Writer administered scheduled medications to pt. Continued support and availability as needed was extended to this pt. Staff continue to monitor pt with q40min checks.  R: No adverse drug reactions noted. Pt receptive to treatment. Pt remains safe at this time.

## 2013-09-02 NOTE — Tx Team (Signed)
  Interdisciplinary Treatment Plan Update   Date Reviewed:  09/02/2013  Time Reviewed:  10:32 AM  Progress in Treatment:   Attending groups: Yes Participating in groups: Yes Taking medication as prescribed: Yes  Tolerating medication: Yes Family/Significant other contact made: Yes  Patient understands diagnosis: Yes  Discussing patient identified problems/goals with staff: Yes Medical problems stabilized or resolved: Yes Denies suicidal/homicidal ideation: Yes Patient has not harmed self or others: Yes  For review of initial/current patient goals, please see plan of care.  Estimated Length of Stay:  D/C tomorrow  Reason for Continuation of Hospitalization:   New Problems/Goals identified:  N/A  Discharge Plan or Barriers:   return home, follow up outpt  Additional Comments:  Attendees:  Signature: [name], MD 09/02/2013 10:32 AM   Signature: Ripley Fraise, LCSW 09/02/2013 10:32 AM  Signature: Elmarie Shiley, NP 09/02/2013 10:32 AM  Signature: Mayra Neer, RN 09/02/2013 10:32 AM  Signature: Darrol Angel, RN 09/02/2013 10:32 AM  Signature:  09/02/2013 10:32 AM  Signature:   09/02/2013 10:32 AM  Signature:    Signature:    Signature:    Signature:    Signature:    Signature:      Scribe for Treatment Team:   Ripley Fraise, LCSW  09/02/2013 10:32 AM

## 2013-09-02 NOTE — BHH Group Notes (Signed)
[place] Group Notes:  (Nursing/MHT/Case Management/Adjunct)  Date:  09/02/2013  Time:  0900 am  Type of Therapy:  Psychoeducational Skills  Participation Level:  Did Not Attend   Lorraine Turner 09/02/2013, 2:06 PM

## 2013-09-02 NOTE — Progress Notes (Addendum)
D:  Patient's self inventory sheet, patient slept good, needs sleep medication which was helpful.  Good appetite, normal energy level, good concentration.  Denied depression and hopeless.  Rated anxiety 2.  Denied withdrawals.  Denied SI.  Denied physical problem.  Goal today is to plan to concentrate on herself.  Looking forward to discharge.  Plans to return home after discharge.  Needs financial assistance with medications after discharge. A:  Medications administered per MD orders.  Emotional support and encouragement given patient. R:  Denied SI and HI, contracts for safety.  Denied A/V hallucinations.  Safety maintained with 15 minute checks.  Patient requested glucerna, believes this will help regulate her blood sugar.

## 2013-09-03 LAB — GLUCOSE, CAPILLARY: Glucose-Capillary: 168 mg/dL — ABNORMAL HIGH (ref 70–99)

## 2013-09-03 MED ORDER — VITAMIN D 1000 UNITS PO TABS: 1000.0000 [IU] | ORAL_TABLET | Freq: Two times a day (BID) | ORAL | Status: DC

## 2013-09-03 MED ORDER — SIMVASTATIN 20 MG PO TABS: 20.0000 mg | ORAL_TABLET | Freq: Every day | ORAL | Status: DC

## 2013-09-03 MED ORDER — LISINOPRIL 5 MG PO TABS: 5.0000 mg | ORAL_TABLET | Freq: Every day | ORAL | Status: DC

## 2013-09-03 MED ORDER — TRAZODONE HCL 100 MG PO TABS: 100.0000 mg | ORAL_TABLET | Freq: Every day | ORAL | Status: DC

## 2013-09-03 MED ORDER — BUSPIRONE HCL 10 MG PO TABS: 10.0000 mg | ORAL_TABLET | Freq: Two times a day (BID) | ORAL | Status: DC

## 2013-09-03 MED ORDER — SITAGLIPTIN PHOS-METFORMIN HCL 50-1000 MG PO TABS: 1.0000 | ORAL_TABLET | Freq: Two times a day (BID) | ORAL | Status: DC

## 2013-09-03 MED ORDER — SERTRALINE HCL 100 MG PO TABS: 100.0000 mg | ORAL_TABLET | Freq: Every day | ORAL | Status: DC

## 2013-09-03 MED ORDER — QUETIAPINE FUMARATE 200 MG PO TABS: 200.0000 mg | ORAL_TABLET | Freq: Every day | ORAL | Status: DC

## 2013-09-03 MED ORDER — GLYBURIDE 5 MG PO TABS: 5.0000 mg | ORAL_TABLET | Freq: Every day | ORAL | Status: DC

## 2013-09-03 NOTE — BHH Suicide Risk Assessment (Signed)
[place] INPATIENT:  Family/Significant Other Suicide Prevention Education  Suicide Prevention Education:  Patient Refusal for Family/Significant Other Suicide Prevention Education: The patient Lorraine Turner has refused to provide written consent for family/significant other to be provided Family/Significant Other Suicide Prevention Education during admission and/or prior to discharge.  Physician notified.  Roque Lias B 09/03/2013, 10:08 AM

## 2013-09-03 NOTE — [facility] Suicide Risk Assessment (Signed)
   Demographic Factors:  Female, Low socioeconomic status and Unemployed  Total Time spent with patient: 20 minutes  Psychiatric Specialty Exam: Physical Exam  Psychiatric: She has a normal mood and affect. Her speech is normal and behavior is normal. Judgment and thought content normal. Cognition and memory are normal.    Review of Systems  Constitutional: Negative.   HENT: Negative.   Eyes: Negative.   Respiratory: Negative.   Cardiovascular: Negative.   Gastrointestinal: Negative.   Genitourinary: Negative.   Musculoskeletal: Negative.   Skin: Negative.   Neurological: Negative.   Endo/Heme/Allergies: Negative.   Psychiatric/Behavioral: Negative.     Blood pressure 128/88, pulse 64, temperature 98.1 F (36.7 C), temperature source Oral, resp. rate 18, height 5\' 2"  (1.575 m), weight 68.493 kg (151 lb).Body mass index is 27.61 kg/(m^2).  General Appearance: Fairly Groomed  Engineer, water::  Good  Speech:  Clear and Coherent and Normal Rate  Volume:  Normal  Mood:  Euthymic  Affect:  Appropriate  Thought Process:  Goal Directed  Orientation:  Full (Time, Place, and Person)  Thought Content:  Negative  Suicidal Thoughts:  No  Homicidal Thoughts:  No  Memory:  Immediate;   Fair Recent;   Fair Remote;   Fair  Judgement:  Fair  Insight:  Fair  Psychomotor Activity:  Normal  Concentration:  Fair  Recall:  [employer] [place]  Language: Fair  Akathisia:  No  Handed:  Right  AIMS (if indicated):     Assets:  Communication Skills Desire for Improvement Physical Health  Sleep:  Number of Hours: 6.75    Musculoskeletal: Strength & Muscle Tone: within normal limits Gait & Station: normal Patient leans: N/A   Mental Status Per Nursing Assessment::   On Admission:  NA  Current Mental Status by Physician: patient denies suicidal ideation, intent or plan  Loss Factors: Financial problems/change in socioeconomic status  Historical Factors: NA  Risk  Reduction Factors:   Sense of responsibility to family and Living with another person, especially a relative  Continued Clinical Symptoms:  Resolving depression and psychosis  Cognitive Features That Contribute To Risk:  Closed-mindedness    Suicide Risk:  Minimal: No identifiable suicidal ideation.  Patients presenting with no risk factors but with morbid ruminations; may be classified as minimal risk based on the severity of the depressive symptoms  Discharge Diagnoses:   AXIS I:  Schizoaffective disorder, depressive type  AXIS II:  Cluster B Traits AXIS III:   Past Medical History  Diagnosis Date  . Hypertension   . Diabetes mellitus   . Bipolar 1 disorder   . Schizophrenia   . Anxiety   . Depression    AXIS IV:  other psychosocial or environmental problems and problems related to social environment AXIS V:  61-70 mild symptoms  Plan Of Care/Follow-up recommendations:  Activity:  as tolerated Diet:  healthy Tests:  routine Other:  patient to keep her after care appointment  Is patient on multiple antipsychotic therapies at discharge:  No   Has Patient had three or more failed trials of antipsychotic monotherapy by history:  No  Recommended Plan for Multiple Antipsychotic Therapies: NA    [name], MD 09/03/2013, 9:36 AM

## 2013-09-03 NOTE — Progress Notes (Signed)
Patient ID: Lorraine Turner, female   DOB: February 10, 1961, 53 y.o.   MRN: [id number] D. Patient slated for discharge, per treatment team. Orders received for patients discharge. Lorraine Turner states ''I'm ready to go, I'm doing good, I'm going to follow up and use my coping skills. '' Pt denies any SI/HI/A/V Hallucinations. Discharge instructions reviewed - AVS reviewed at length and copy provided for patient. Pt verbalized understanding. Crisis services reviewed at length. Rx given and all belongings returned. 14 day free supply of medications given as well. Bus pass provided, and letter provided from SW for patient. Pt verbalized understanding of follow up plan. No signs of acute decompensation. Pt escorted from unit to lobby.

## 2013-09-03 NOTE — Progress Notes (Signed)
[facility] Adult Case Management Discharge Plan :  Will you be returning to the same living situation after discharge: Yes,  home At discharge, do you have transportation home?:Yes,  bus pass Do you have the ability to pay for your medications:Yes,  mental health  Release of information consent forms completed and in the chart;  Patient's signature needed at discharge.  Patient to Follow up at: Follow-up Information   Follow up with [facility] [place] On 09/07/2013. (Tuesday at 3:[address])    Contact information:   [place] [phone]      Patient denies SI/HI:   Yes,  yes    Safety Planning and Suicide Prevention discussed:  Yes,  yes  Lorraine Turner 09/03/2013, 10:08 AM
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
## 2013-09-08 NOTE — Progress Notes (Signed)
Patient Discharge Instructions:  After Visit Summary (AVS):   Faxed to:  09/08/13 Discharge Summary Note:   Faxed to:  09/08/13 Psychiatric Admission Assessment Note:   Faxed to:  09/08/13 Suicide Risk Assessment - Discharge Assessment:   Faxed to:  09/08/13 Faxed/Sent to the Next Level Care provider:  09/08/13 Faxed to [facility] [place], 09/08/2013, 3:50 PM

## 2013-09-20 ENCOUNTER — Encounter (HOSPITAL_COMMUNITY): Payer: Self-pay | Admitting: Emergency Medicine

## 2013-09-20 ENCOUNTER — Emergency Department (HOSPITAL_COMMUNITY): Payer: Medicaid Other

## 2013-09-20 ENCOUNTER — Emergency Department (HOSPITAL_COMMUNITY)
Admission: EM | Admit: 2013-09-20 | Discharge: 2013-09-20 | Disposition: A | Discharge status: Home / Self Care | Payer: Medicaid Other | Attending: Emergency Medicine | Admitting: Emergency Medicine

## 2013-09-20 DIAGNOSIS — E119 Type 2 diabetes mellitus without complications: Secondary | ICD-10-CM | POA: Insufficient documentation

## 2013-09-20 DIAGNOSIS — I10 Essential (primary) hypertension: Secondary | ICD-10-CM | POA: Insufficient documentation

## 2013-09-20 DIAGNOSIS — M542 Cervicalgia: Secondary | ICD-10-CM

## 2013-09-20 DIAGNOSIS — Z87891 Personal history of nicotine dependence: Secondary | ICD-10-CM | POA: Insufficient documentation

## 2013-09-20 DIAGNOSIS — F319 Bipolar disorder, unspecified: Secondary | ICD-10-CM | POA: Insufficient documentation

## 2013-09-20 DIAGNOSIS — F411 Generalized anxiety disorder: Secondary | ICD-10-CM | POA: Insufficient documentation

## 2013-09-20 DIAGNOSIS — F209 Schizophrenia, unspecified: Secondary | ICD-10-CM | POA: Insufficient documentation

## 2013-09-20 DIAGNOSIS — Z79899 Other long term (current) drug therapy: Secondary | ICD-10-CM | POA: Insufficient documentation

## 2013-09-20 MED ORDER — METHOCARBAMOL 500 MG PO TABS: 500.0000 mg | ORAL_TABLET | Freq: Two times a day (BID) | ORAL | Status: DC

## 2013-09-20 NOTE — ED Provider Notes (Signed)
CSN: [id number]     Arrival date & time 09/20/13  1120 History   First MD Initiated Contact with Patient 09/20/13 1127     Chief Complaint  Patient presents with  . Neck Pain     (Consider location/radiation/quality/duration/timing/severity/associated sxs/prior Treatment) The history is provided by the patient and medical records.   This is a 53 y.o. F with PMH significant for HTN, DM, bipolar d/o, schizophrenia, anxiety, depression, presenting to the ED for neck pain.  Patient reports she was jumping on her bed this morning around 1am, attempted to do a flip and injured her neck.  States she did not land on floor, no LOC.  States the right side of her neck feels tense.  Denies numbness, weakness, paresthesias of upper extremities.  No headache, dizziness, lightheadedness, visual disturbance, changes in speech.  No prior neck injuries.  No other injuries reported.    Past Medical History  Diagnosis Date  . Hypertension   . Diabetes mellitus   . Bipolar 1 disorder   . Schizophrenia   . Anxiety   . Depression    History reviewed. No pertinent past surgical history. No family history on file. History  Substance Use Topics  . Smoking status: Former Smoker    Types: Cigars    Quit date: 03/14/2013  . Smokeless tobacco: Not on file  . Alcohol Use: No     Comment: every weekend   OB History   Grav Para Term Preterm Abortions TAB SAB Ect Mult Living                 Review of Systems  Musculoskeletal: Positive for neck pain.  All other systems reviewed and are negative.     Allergies  Review of patient's allergies indicates no known allergies.  Home Medications   Prior to Admission medications   Medication Sig Start Date End Date Taking? Authorizing Provider  busPIRone (BUSPAR) 10 MG tablet Take 1 tablet (10 mg total) by mouth 2 (two) times daily. 09/03/13   Elmarie Shiley, NP  cholecalciferol (VITAMIN D) 1000 UNITS tablet Take 1 tablet (1,000 Units total) by mouth 2 (two)  times daily. 09/03/13   Elmarie Shiley, NP  glyBURIDE (DIABETA) 5 MG tablet Take 1 tablet (5 mg total) by mouth daily with breakfast. 09/03/13   Elmarie Shiley, NP  lisinopril (PRINIVIL,ZESTRIL) 5 MG tablet Take 1 tablet (5 mg total) by mouth daily. 09/03/13   Elmarie Shiley, NP  QUEtiapine (SEROQUEL) 200 MG tablet Take 1 tablet (200 mg total) by mouth at bedtime. 09/03/13   Elmarie Shiley, NP  sertraline (ZOLOFT) 100 MG tablet Take 1 tablet (100 mg total) by mouth daily. 09/03/13   Elmarie Shiley, NP  simvastatin (ZOCOR) 20 MG tablet Take 1 tablet (20 mg total) by mouth daily at 6 PM. 09/03/13   Elmarie Shiley, NP  sitaGLIPtin-metformin (JANUMET) 50-1000 MG per tablet Take 1 tablet by mouth 2 (two) times daily with a meal. 09/03/13   Elmarie Shiley, NP  traZODone (DESYREL) 100 MG tablet Take 1 tablet (100 mg total) by mouth at bedtime. 09/03/13   Elmarie Shiley, NP   BP 156/67  Pulse 62  Temp(Src) 98.7 F (37.1 C) (Oral)  Resp 20  SpO2 100%  Physical Exam  Nursing note and vitals reviewed. Constitutional: She is oriented to person, place, and time. She appears well-developed and well-nourished.  HENT:  Head: Normocephalic and atraumatic.  Mouth/Throat: Oropharynx is clear and moist.  Eyes: Conjunctivae and EOM are normal. Pupils are  equal, round, and reactive to light.  Neck: Normal range of motion.  Cardiovascular: Normal rate, regular rhythm and normal heart sounds.   Pulmonary/Chest: Effort normal and breath sounds normal.  Abdominal: Soft. Bowel sounds are normal.  Musculoskeletal: Normal range of motion.  c-collar in place; normal ROM and strength of BUE; both arms NVI  Neurological: She is alert and oriented to person, place, and time.  AAOx3, answering questions appropriately; equal strength UE and LE bilaterally; CN grossly intact; moves all extremities appropriately without ataxia; no focal neuro deficits or facial asymmetry appreciated  Skin: Skin is warm and dry.  Psychiatric: She has a normal mood and  affect.    ED Course  Procedures (including critical care time) Labs Review Labs Reviewed - No data to display  Imaging Review Ct Cervical Spine Wo Contrast  09/20/2013   CLINICAL DATA:  Neck injury while jumping on a bed  EXAM: CT CERVICAL SPINE WITHOUT CONTRAST  TECHNIQUE: Multidetector CT imaging of the cervical spine was performed without intravenous contrast. Multiplanar CT image reconstructions were also generated.  COMPARISON:  05/09/2012  FINDINGS: Prevertebral soft tissues normal thickness.  Osseous mineralization normal.  Visualized skullbase intact.  Disc space narrowing with endplate spur formation and sclerosis at C5-C6.  Minimal endplate spur formation at C6-C7.  Vertebral body heights maintained without fracture or subluxation.  No bone destruction.  Lung apices clear.  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  No acute cervical spine abnormalities.   Electronically Signed   By: Lavonia Dana M.D.   On: 09/20/2013 12:12     EKG Interpretation None      MDM   Final diagnoses:  Neck pain   CT cervical spine negative for acute findings. C-collar was removed and patient was able to fully range her neck without difficulty. She has some muscle spasm present along right trapezius.  Neurologic exam is non-focal.  Patient states her neck feels better when in the collar, have placed a soft collar for comfort.  Rx robaxin. FU with PCP. Discussed plan with patient, he/she acknowledged understanding and agreed with plan of care.  Return precautions given for new or worsening symptoms.  Larene Pickett, PA-C 09/20/13 1330

## 2013-09-20 NOTE — ED Notes (Signed)
Pt to CT

## 2013-09-20 NOTE — ED Notes (Signed)
Per EMS pt was jumping on her bed this morning did flip on the bed and hurt her neck. She did not land on the floor. She did not hear a pop and there is no numbness or tingling to either extremity. Pt alert and oriented. No LOC. NAD

## 2013-09-20 NOTE — ED Notes (Signed)
Pt back from CT

## 2013-09-20 NOTE — Discharge Instructions (Signed)
Take the prescribed medication as directed.  May wear collar for comfort Follow-up with your primary care physician. Return to the ED for new or worsening symptoms.

## 2013-09-22 NOTE — ED Provider Notes (Signed)
Medical screening examination/treatment/procedure(s) were performed by non-physician practitioner and as supervising physician I was immediately available for consultation/collaboration.  Leota Jacobsen, MD 09/22/13 [phone]

## 2014-03-31 ENCOUNTER — Emergency Department (HOSPITAL_COMMUNITY)
Admission: EM | Admit: 2014-03-31 | Discharge: 2014-03-31 | Disposition: A | Discharge status: Home / Self Care | Payer: Medicaid Other | Attending: Emergency Medicine | Admitting: Emergency Medicine

## 2014-03-31 ENCOUNTER — Encounter (HOSPITAL_COMMUNITY): Payer: Self-pay | Admitting: *Deleted

## 2014-03-31 DIAGNOSIS — F319 Bipolar disorder, unspecified: Secondary | ICD-10-CM | POA: Insufficient documentation

## 2014-03-31 DIAGNOSIS — R109 Unspecified abdominal pain: Secondary | ICD-10-CM | POA: Insufficient documentation

## 2014-03-31 DIAGNOSIS — R739 Hyperglycemia, unspecified: Secondary | ICD-10-CM

## 2014-03-31 DIAGNOSIS — Z79899 Other long term (current) drug therapy: Secondary | ICD-10-CM | POA: Insufficient documentation

## 2014-03-31 DIAGNOSIS — E1165 Type 2 diabetes mellitus with hyperglycemia: Secondary | ICD-10-CM | POA: Insufficient documentation

## 2014-03-31 DIAGNOSIS — Z87891 Personal history of nicotine dependence: Secondary | ICD-10-CM | POA: Insufficient documentation

## 2014-03-31 DIAGNOSIS — I10 Essential (primary) hypertension: Secondary | ICD-10-CM | POA: Insufficient documentation

## 2014-03-31 DIAGNOSIS — F419 Anxiety disorder, unspecified: Secondary | ICD-10-CM | POA: Insufficient documentation

## 2014-03-31 DIAGNOSIS — F209 Schizophrenia, unspecified: Secondary | ICD-10-CM | POA: Insufficient documentation

## 2014-03-31 LAB — COMPREHENSIVE METABOLIC PANEL
ALBUMIN: 4.2 g/dL (ref 3.5–5.2)
ALT: 29 U/L (ref 0–35)
AST: 16 U/L (ref 0–37)
Alkaline Phosphatase: 121 U/L — ABNORMAL HIGH (ref 39–117)
Anion gap: 9 (ref 5–15)
BUN: 14 mg/dL (ref 6–23)
CALCIUM: 10.4 mg/dL (ref 8.4–10.5)
CO2: 27 mmol/L (ref 19–32)
CREATININE: 0.93 mg/dL (ref 0.50–1.10)
Chloride: 95 mmol/L — ABNORMAL LOW (ref 96–112)
GFR calc Af Amer: 80 mL/min — ABNORMAL LOW (ref >90)
GFR calc non Af Amer: 69 mL/min — ABNORMAL LOW (ref >90)
Glucose, Bld: 513 mg/dL — ABNORMAL HIGH (ref 70–99)
Potassium: 4.1 mmol/L (ref 3.5–5.1)
SODIUM: 131 mmol/L — AB (ref 135–145)
Total Bilirubin: 1 mg/dL (ref 0.3–1.2)
Total Protein: 7.8 g/dL (ref 6.0–8.3)

## 2014-03-31 LAB — CBC WITH DIFFERENTIAL/PLATELET
BASOS ABS: 0 10*3/uL (ref 0.0–0.1)
BASOS PCT: 1 % (ref 0–1)
EOS ABS: 0.1 10*3/uL (ref 0.0–0.7)
Eosinophils Relative: 1 % (ref 0–5)
HCT: 39.1 % (ref 36.0–46.0)
Hemoglobin: 13.6 g/dL (ref 12.0–15.0)
Lymphocytes Relative: 30 % (ref 12–46)
Lymphs Abs: 2 10*3/uL (ref 0.7–4.0)
MCH: 29.7 pg (ref 26.0–34.0)
MCHC: 34.8 g/dL (ref 30.0–36.0)
MCV: 85.4 fL (ref 78.0–100.0)
Monocytes Absolute: 0.4 10*3/uL (ref 0.1–1.0)
Monocytes Relative: 7 % (ref 3–12)
NEUTROS PCT: 61 % (ref 43–77)
Neutro Abs: 4.1 10*3/uL (ref 1.7–7.7)
PLATELETS: 309 10*3/uL (ref 150–400)
RBC: 4.58 MIL/uL (ref 3.87–5.11)
RDW: 12.7 % (ref 11.5–15.5)
WBC: 6.6 10*3/uL (ref 4.0–10.5)

## 2014-03-31 LAB — CBG MONITORING, ED: GLUCOSE-CAPILLARY: 297 mg/dL — AB (ref 70–99)

## 2014-03-31 MED ORDER — SODIUM CHLORIDE 0.9 % IV SOLN: 1000.0000 mL | INTRAVENOUS | Status: DC

## 2014-03-31 MED ORDER — SODIUM CHLORIDE 0.9 % IV SOLN
1000.0000 mL | Freq: Once | INTRAVENOUS | Status: AC
  Administered 2014-03-31: 1000 mL via INTRAVENOUS

## 2014-03-31 MED ORDER — INSULIN ASPART 100 UNIT/ML ~~LOC~~ SOLN
8.0000 [IU] | Freq: Once | SUBCUTANEOUS | Status: AC
  Administered 2014-03-31: 8 [IU] via SUBCUTANEOUS
  Filled 2014-03-31: qty 1

## 2014-03-31 NOTE — ED Notes (Signed)
Pt coming from PCP with c/o hyperglycemia. Pt states cbg was 634. Pt was not and has not taken in medication to lower blood sugar.

## 2014-03-31 NOTE — ED Notes (Signed)
Check patient blood sugar it was 490 notified RN Eric of blood sugar

## 2014-03-31 NOTE — ED Notes (Signed)
CBG 297

## 2014-03-31 NOTE — Discharge Instructions (Signed)
Hyperglycemia °Hyperglycemia occurs when the glucose (sugar) in your blood is too high. Hyperglycemia can happen for many reasons, but it most often happens to people who do not know they have diabetes or are not managing their diabetes properly.  °CAUSES  °Whether you have diabetes or not, there are other causes of hyperglycemia. Hyperglycemia can occur when you have diabetes, but it can also occur in other situations that you might not be as aware of, such as: °Diabetes °· If you have diabetes and are having problems controlling your blood glucose, hyperglycemia could occur because of some of the following reasons: °¨ Not following your meal plan. °¨ Not taking your diabetes medications or not taking it properly. °¨ Exercising less or doing less activity than you normally do. °¨ Being sick. °Pre-diabetes °· This cannot be ignored. Before people develop Type 2 diabetes, they almost always have "pre-diabetes." This is when your blood glucose levels are higher than normal, but not yet high enough to be diagnosed as diabetes. Research has shown that some long-term damage to the body, especially the heart and circulatory system, may already be occurring during pre-diabetes. If you take action to manage your blood glucose when you have pre-diabetes, you may delay or prevent Type 2 diabetes from developing. °Stress °· If you have diabetes, you may be "diet" controlled or on oral medications or insulin to control your diabetes. However, you may find that your blood glucose is higher than usual in the hospital whether you have diabetes or not. This is often referred to as "stress hyperglycemia." Stress can elevate your blood glucose. This happens because of hormones put out by the body during times of stress. If stress has been the cause of your high blood glucose, it can be followed regularly by your caregiver. That way he/she can make sure your hyperglycemia does not continue to get worse or progress to  diabetes. °Steroids °· Steroids are medications that act on the infection fighting system (immune system) to block inflammation or infection. One side effect can be a rise in blood glucose. Most people can produce enough extra insulin to allow for this rise, but for those who cannot, steroids make blood glucose levels go even higher. It is not unusual for steroid treatments to "uncover" diabetes that is developing. It is not always possible to determine if the hyperglycemia will go away after the steroids are stopped. A special blood test called an A1c is sometimes done to determine if your blood glucose was elevated before the steroids were started. °SYMPTOMS °· Thirsty. °· Frequent urination. °· Dry mouth. °· Blurred vision. °· Tired or fatigue. °· Weakness. °· Sleepy. °· Tingling in feet or leg. °DIAGNOSIS  °Diagnosis is made by monitoring blood glucose in one or all of the following ways: °· A1c test. This is a chemical found in your blood. °· Fingerstick blood glucose monitoring. °· Laboratory results. °TREATMENT  °First, knowing the cause of the hyperglycemia is important before the hyperglycemia can be treated. Treatment may include, but is not be limited to: °· Education. °· Change or adjustment in medications. °· Change or adjustment in meal plan. °· Treatment for an illness, infection, etc. °· More frequent blood glucose monitoring. °· Change in exercise plan. °· Decreasing or stopping steroids. °· Lifestyle changes. °HOME CARE INSTRUCTIONS  °· Test your blood glucose as directed. °· Exercise regularly. Your caregiver will give you instructions about exercise. Pre-diabetes or diabetes which comes on with stress is helped by exercising. °· Eat wholesome,   balanced meals. Eat often and at regular, fixed times. Your caregiver or nutritionist will give you a meal plan to guide your sugar intake.  Being at an ideal weight is important. If needed, losing as little as 10 to 15 pounds may help improve blood  glucose levels. SEEK MEDICAL CARE IF:   You have questions about medicine, activity, or diet.  You continue to have symptoms (problems such as increased thirst, urination, or weight gain). SEEK IMMEDIATE MEDICAL CARE IF:   You are vomiting or have diarrhea.  Your breath smells fruity.  You are breathing faster or slower.  You are very sleepy or incoherent.  You have numbness, tingling, or pain in your feet or hands.  You have chest pain.  Your symptoms get worse even though you have been following your caregiver's orders.  If you have any other questions or concerns. Document Released: 07/24/2000 Document Revised: 04/22/2011 Document Reviewed: 05/27/2011 [facility] Patient Information 2015 [place], [place]. This information is not intended to replace advice given to you by your health care provider. Make sure you discuss any questions you have with your health care provider.   Emergency Department Resource Guide 1) Find a Doctor and Pay Out of Pocket Although you won't have to find out who is covered by your insurance plan, it is a good idea to ask around and get recommendations. You will then need to call the office and see if the doctor you have chosen will accept you as a new patient and what types of options they offer for patients who are self-pay. Some doctors offer discounts or will set up payment plans for their patients who do not have insurance, but you will need to ask so you aren't surprised when you get to your appointment.  2) Contact Your Local Health Department Not all health departments have doctors that can see patients for sick visits, but many do, so it is worth a call to see if yours does. If you don't know where your local health department is, you can check in your phone book. The CDC also has a tool to help you locate your state's health department, and many state websites also have listings of all of their local health departments.  3) Find a [facility] If  your illness is not likely to be very severe or complicated, you may want to try a walk in clinic. These are popping up all over the country in pharmacies, drugstores, and shopping centers. They're usually staffed by nurse practitioners or physician assistants that have been trained to treat common illnesses and complaints. They're usually fairly quick and inexpensive. However, if you have serious medical issues or chronic medical problems, these are probably not your best option.  No Primary Care Doctor: - Call Health Connect at  [phone] - they can help you locate a primary care doctor that  accepts your insurance, provides certain services, etc. - Physician Referral Service- [phone]  Chronic Pain Problems: Organization         Address  Phone   Notes  [facility]  [phone] Patients need to be referred by their primary care doctor.   Medication Assistance: Organization         Address  Phone   Notes  [facility] Medication [facility] [address] [place], [place] [address] [phone] --Must be a resident of [facility] -- Must have NO insurance coverage whatsoever (no Medicaid/ Medicare, etc.) -- The pt. MUST have  a primary care doctor that directs their care regularly and follows them in the community   MedAssist  [phone]   [employer]  [phone]    Agencies that provide inexpensive medical care: Organization         Address  Phone   Notes  [place]  [phone]   [facility]    [phone]   [facility] [place], [place] [address] [phone]   [place] [address] [place] [phone]   Planned Parenthood    [phone]   [facility]    [phone]   [place] and [place] [address], [place] Phone:  [phone], Fax:  [phone] Hours of  Operation:  9 am - 6 pm, M-F.  Also accepts Medicaid/Medicare and self-pay.  [facility] for [place] [address], [address], [place] Phone: [phone], Fax: [phone]. Hours of Operation:  8:30 am - 5:30 pm, M-F.  Also accepts Medicaid and self-pay.  [facility] [place] [address] [place] Phone: [phone]   [place], [place], [place] [phone], Ext. [phone] Mondays & Thursdays: 7-9 AM.  First 15 patients are seen on a first come, first serve basis.    [place] Providers:  Organization         Address  Phone   Notes  [facility] [address], [place] [phone] Also accepts self-pay patients.  [facility] [id number] [place], [place]  [phone]   [place], [address], [place] [phone]   [facility] [address] [place] [phone]   Lucianne Lei [address], [place]   [phone] Only accepts [place] Access [place] patients after they have their name applied to their card.   Self-Pay (no insurance) in [facility]:  Organization         Address  Phone   Notes  Sickle Cell Patients, [facility] [place] [phone]   [facility] [place] [phone]   [facility] [place]  [place], [place], [place] [phone]   [facility]/Dr. Osei-Bonsu  [address] [place] or [address], [place] [phone] Phone number for both [place] and [place] locations is the same.  Urgent Medical and [facility] [address], [place] [phone]   [facility] [address] [place] or [address] [phone]   [facility] [address], [place] [phone], phone; [phone], fax Sees patients 1st and 3rd Saturday of every month.  Must not qualify for public or private insurance (i.e. Medicaid, Medicare,  Health Choice, Veterans' Benefits)  Household income should be no more than 200% of the poverty level The clinic cannot treat you if you are pregnant or think you are pregnant  Sexually transmitted diseases are not treated at the clinic.    Dental Care: Organization         Address  Phone  Notes  [facility] [address] [place], [place] (  [phone] Accepts children up to age 68 who are enrolled in Medicaid or [place]; pregnant women with a Medicaid card; and children who have applied for Medicaid or [place] Health Choice, but were declined, whose parents can pay a reduced fee at time of service.  [facility] Department of [facility]  [address], [place] [phone] Accepts children up to age 29 who are enrolled in [place] or [place]; pregnant women with a Medicaid card; and children who have applied for Medicaid or [place] Health Choice, but were declined, whose parents can pay a reduced fee at time of service.  [place] Adult [facility] Access PROGRAM  [place] [phone] Patients are seen by appointment only. Walk-ins are not accepted. [place] will see patients 46 years of age and older. Monday - Tuesday (8am-5pm) Most Wednesdays (8:30-5pm) $30 per visit, cash only  [facility] Adult Dental Access PROGRAM  [address], [facility] [phone] Patients are seen by appointment only. Walk-ins are not accepted. [place] will see patients 20 years of age and older. One Wednesday Evening (Monthly: Volunteer Based).  $30 per visit, cash only  [place]  [phone] for adults; Children under age 41, call Graduate Pediatric  Dentistry at [phone]. Children aged 14-14, please call [phone] to request a pediatric application.  Dental services are provided in all areas of dental care including fillings, crowns and bridges, complete and partial dentures, implants, gum treatment, root canals, and extractions. Preventive care is also provided. Treatment is provided to both adults and children. Patients are selected via a lottery and there is often a waiting list.   [facility] [address], [place]  [phone] [email or web address]   [facility] [address] [place], [place] [phone], Ext. [phone] Second and Fourth Thursday of each month, opens at 6:30 AM; Clinic ends at 9 AM.  Patients are seen on a first-come first-served basis, and a limited number are seen during each clinic.   [facility]  [address] Hillard Danker [place], [place] [phone]   Eligibility Requirements You must have lived in [place], [place], or [place] counties for at least the last three months.   You cannot be eligible for state or federal sponsored [employer], including [employer], [place], or [employer].   You generally cannot be eligible for healthcare insurance through your employer.    How to apply: Eligibility screenings are held every Tuesday and Wednesday afternoon from 1:00 pm until 4:00 pm. You do not need an appointment for the interview!  [facility] [address], [place], [place]   [place]  [place] Department  [place]  [phone]    Behavioral Health Resources in the Community: Intensive Outpatient Programs Organization         Address  Phone  Notes  [place]. [address] [place], [place] [phone]   [facility] Outpatient [address] [place], [place]   ADS:  Alcohol [facility] Drug Svcs [address] [place], [place]   [place] [address],  [place], [place] or [phone]   Substance Abuse Resources Organization         Address  Phone  Notes  Alcohol and Drug Services  [place]  [phone]   [place]   Chinita Pester  [phone]   Residential & Outpatient Substance Abuse Program  [phone]   Psychological Services Organization         Address  Phone  Notes  [place]  [place]  [phone]   [place] [address] [place] or [phone]    Mobile Crisis Teams Organization         Address  Phone  Notes  [facility], Mobile Crisis Care Unit  [phone]   [facility]ive Psychotherapeutic Services  [address] [place], [place]   [place] [address] [place] [employer] [phone]    Self-Help/Support Groups Organization         Address  Phone             Notes  [employer] of [place] - variety of support groups  [place] Call for more information  Narcotics Anonymous (NA), [facility] [address], [employer] [place]  2 meetings at this location   Special educational needs teacher         Address  Phone  Notes  [facility] Residential Treatment [place],    [place]  [phone]   [facility]  [address], [place] [id number], [place], [place]   [place], [place] [phone] Admissions: 8am-3pm M-F  Incentives Substance [place] [address]    [place], [place] [phone]   [facility] [address] [place], [place], [place]   [facility] [address],  [place], [place]   [facility] - Intensive Outpatient [address],  [place], [place]   [facility] [address].,  [place], [place] [phone] or [phone]   [facility] Services (RTS) [address] [place], [place] Accepts Medicaid  [facility] [place] [address]  [place] [phone] Substance Abuse/Addiction Treatment   [facility] Organization         Address  Phone  Notes  [facility]  [phone]   Domenic Schwab, PhD [address] Arlis Porta [place], [place]   [phone] or [phone]   [place], [place] [phone]   [facility] [phone], [place], [place] [phone] Insurance/Medicaid/sponsorship through [facility] and Families [address] [place]                                    [place], [place] [phone] [place] [address], [place] [phone]    Dr. Adele Schilder  [phone]   [facility] Dept. 1) [address], [place] 2) [place] 3)  [place] [address], [place] [phone]  [phone]   [place] [phone] or [phone] (After Hours)

## 2014-03-31 NOTE — ED Provider Notes (Signed)
CSN: [id number]     Arrival date & time 03/31/14  1700 History   First MD Initiated Contact with Patient 03/31/14 2025     Chief Complaint  Patient presents with  . Hyperglycemia     (Consider location/radiation/quality/duration/timing/severity/associated sxs/prior Treatment) Patient is a 54 y.o. female presenting with hyperglycemia. The history is provided by the patient. No language interpreter was used.  Hyperglycemia Blood sugar level PTA:  600 Severity:  Moderate Onset quality:  Gradual Timing:  Constant Progression:  Worsening Chronicity:  New Current diabetic therapy:  Metformin Context: not change in medication   Relieved by:  Nothing Ineffective treatments:  None tried Associated symptoms: abdominal pain and syncope   Associated symptoms: no blurred vision, no fatigue, no increased appetite, no increased thirst, no shortness of breath, no vomiting and no weakness   Risk factors: no obesity and no pregnancy   Pt reports she has been drinking alcohol and not taking her metformin.   Pt reports alcohol makes her sugar go up.   Pt reports she has metformin at the pharmacy and can get tomorrow.  Pt does not want treatment for alcohol abuse/use.  Pt denies depression or thoughts of harming herself.  Pt reports she lives with family.   Past Medical History  Diagnosis Date  . Hypertension   . Diabetes mellitus   . Bipolar 1 disorder   . Schizophrenia   . Anxiety   . Depression    History reviewed. No pertinent past surgical history. History reviewed. No pertinent family history. History  Substance Use Topics  . Smoking status: Former Smoker    Types: Cigars    Quit date: 03/14/2013  . Smokeless tobacco: Not on file  . Alcohol Use: No     Comment: every weekend   OB History    No data available     Review of Systems  Constitutional: Negative for fatigue.  Eyes: Negative for blurred vision.  Respiratory: Negative for shortness of breath.   Cardiovascular: Positive  for syncope.  Gastrointestinal: Positive for abdominal pain. Negative for vomiting.  Endocrine: Negative for polydipsia.  Neurological: Negative for weakness.  All other systems reviewed and are negative.     Allergies  Review of patient's allergies indicates no known allergies.  Home Medications   Prior to Admission medications   Medication Sig Start Date End Date Taking? Authorizing Provider  glyBURIDE (DIABETA) 5 MG tablet Take 1 tablet (5 mg total) by mouth daily with breakfast. 09/03/13  Yes Elmarie Shiley, NP  lisinopril (PRINIVIL,ZESTRIL) 5 MG tablet Take 1 tablet (5 mg total) by mouth daily. 09/03/13  Yes Elmarie Shiley, NP  simvastatin (ZOCOR) 20 MG tablet Take 1 tablet (20 mg total) by mouth daily at 6 PM. 09/03/13  Yes Elmarie Shiley, NP  busPIRone (BUSPAR) 10 MG tablet Take 1 tablet (10 mg total) by mouth 2 (two) times daily. Patient not taking: Reported on 03/31/2014 09/03/13   Elmarie Shiley, NP  cholecalciferol (VITAMIN D) 1000 UNITS tablet Take 1 tablet (1,000 Units total) by mouth 2 (two) times daily. 09/03/13   Elmarie Shiley, NP  methocarbamol (ROBAXIN) 500 MG tablet Take 1 tablet (500 mg total) by mouth 2 (two) times daily. Patient not taking: Reported on 03/31/2014 09/20/13   Larene Pickett, PA-C  QUEtiapine (SEROQUEL) 200 MG tablet Take 1 tablet (200 mg total) by mouth at bedtime. Patient not taking: Reported on 03/31/2014 09/03/13   Elmarie Shiley, NP  sertraline (ZOLOFT) 100 MG tablet Take 1 tablet (100 mg  total) by mouth daily. Patient not taking: Reported on 03/31/2014 09/03/13   Elmarie Shiley, NP  sitaGLIPtin-metformin (JANUMET) 50-1000 MG per tablet Take 1 tablet by mouth 2 (two) times daily with a meal. Patient not taking: Reported on 03/31/2014 09/03/13   Elmarie Shiley, NP  traZODone (DESYREL) 100 MG tablet Take 1 tablet (100 mg total) by mouth at bedtime. Patient not taking: Reported on 03/31/2014 09/03/13   Elmarie Shiley, NP   BP 119/60 mmHg  Pulse 88  Temp(Src) 98.4 F (36.9 C) (Oral)   Resp 18  Ht 5\' 2"  (1.575 m)  Wt 147 lb (66.679 kg)  BMI 26.88 kg/m2  SpO2 99% Physical Exam  Constitutional: She is oriented to person, place, and time. She appears well-developed and well-nourished.  HENT:  Head: Normocephalic and atraumatic.  Eyes: EOM are normal. Pupils are equal, round, and reactive to light.  Neck: Normal range of motion.  Cardiovascular: Normal rate and normal heart sounds.   Pulmonary/Chest: Effort normal.  Abdominal: Soft. She exhibits no distension.  Musculoskeletal: Normal range of motion.  Neurological: She is alert and oriented to person, place, and time.  Skin: Skin is warm.  Psychiatric: She has a normal mood and affect.  Nursing note and vitals reviewed.   ED Course  Procedures (including critical care time) Labs Review Labs Reviewed  COMPREHENSIVE METABOLIC PANEL - Abnormal; Notable for the following:    Sodium 131 (*)    Chloride 95 (*)    Glucose, Bld 513 (*)    Alkaline Phosphatase 121 (*)    GFR calc non Af Amer 69 (*)    GFR calc Af Amer 80 (*)    All other components within normal limits  CBC WITH DIFFERENTIAL/PLATELET    Imaging Review No results found.   EKG Interpretation None      Results for orders placed or performed during the hospital encounter of 03/31/14  CBC with Differential  Result Value Ref Range   WBC 6.6 4.0 - 10.5 K/uL   RBC 4.58 3.87 - 5.11 MIL/uL   Hemoglobin 13.6 12.0 - 15.0 g/dL   HCT 39.1 36.0 - 46.0 %   MCV 85.4 78.0 - 100.0 fL   MCH 29.7 26.0 - 34.0 pg   MCHC 34.8 30.0 - 36.0 g/dL   RDW 12.7 11.5 - 15.5 %   Platelets 309 150 - 400 K/uL   Neutrophils Relative % 61 43 - 77 %   Neutro Abs 4.1 1.7 - 7.7 K/uL   Lymphocytes Relative 30 12 - 46 %   Lymphs Abs 2.0 0.7 - 4.0 K/uL   Monocytes Relative 7 3 - 12 %   Monocytes Absolute 0.4 0.1 - 1.0 K/uL   Eosinophils Relative 1 0 - 5 %   Eosinophils Absolute 0.1 0.0 - 0.7 K/uL   Basophils Relative 1 0 - 1 %   Basophils Absolute 0.0 0.0 - 0.1 K/uL   Comprehensive metabolic panel  Result Value Ref Range   Sodium 131 (L) 135 - 145 mmol/L   Potassium 4.1 3.5 - 5.1 mmol/L   Chloride 95 (L) 96 - 112 mmol/L   CO2 27 19 - 32 mmol/L   Glucose, Bld 513 (H) 70 - 99 mg/dL   BUN 14 6 - 23 mg/dL   Creatinine, Ser 0.93 0.50 - 1.10 mg/dL   Calcium 10.4 8.4 - 10.5 mg/dL   Total Protein 7.8 6.0 - 8.3 g/dL   Albumin 4.2 3.5 - 5.2 g/dL   AST 16 0 -  37 U/L   ALT 29 0 - 35 U/L   Alkaline Phosphatase 121 (H) 39 - 117 U/L   Total Bilirubin 1.0 0.3 - 1.2 mg/dL   GFR calc non Af Amer 69 (L) >90 mL/min   GFR calc Af Amer 80 (L) >90 mL/min   Anion gap 9 5 - 15  CBG monitoring, ED  Result Value Ref Range   Glucose-Capillary 297 (H) 70 - 99 mg/dL   No results found.   MDM  Pt given Iv fluids x 2 liters.  Insulin 8 units subq.   Pt had meal here.  She reports she feels much better.     Final diagnoses:  Hyperglycemia    Pt reports she will restart her metformin tomorrow.  Pt looks well.     Hollace Kinnier [place], PA-C 03/31/14 2317  Charlesetta Shanks, MD 03/31/14 [phone]

## 2014-04-01 LAB — CBG MONITORING, ED: GLUCOSE-CAPILLARY: 490 mg/dL — AB (ref 70–99)

## 2014-05-05 ENCOUNTER — Emergency Department (HOSPITAL_COMMUNITY)
Admission: EM | Admit: 2014-05-05 | Discharge: 2014-05-06 | Disposition: A | Discharge status: Other Acute Inpatient Hospital | Payer: Self-pay | Attending: Emergency Medicine | Admitting: Emergency Medicine

## 2014-05-05 ENCOUNTER — Encounter (HOSPITAL_COMMUNITY): Payer: Self-pay | Admitting: Emergency Medicine

## 2014-05-05 DIAGNOSIS — T1491XA Suicide attempt, initial encounter: Secondary | ICD-10-CM

## 2014-05-05 DIAGNOSIS — Z87891 Personal history of nicotine dependence: Secondary | ICD-10-CM | POA: Insufficient documentation

## 2014-05-05 DIAGNOSIS — Z79899 Other long term (current) drug therapy: Secondary | ICD-10-CM | POA: Insufficient documentation

## 2014-05-05 DIAGNOSIS — I10 Essential (primary) hypertension: Secondary | ICD-10-CM | POA: Insufficient documentation

## 2014-05-05 DIAGNOSIS — F251 Schizoaffective disorder, depressive type: Secondary | ICD-10-CM | POA: Insufficient documentation

## 2014-05-05 DIAGNOSIS — F419 Anxiety disorder, unspecified: Secondary | ICD-10-CM | POA: Insufficient documentation

## 2014-05-05 DIAGNOSIS — E119 Type 2 diabetes mellitus without complications: Secondary | ICD-10-CM | POA: Insufficient documentation

## 2014-05-05 DIAGNOSIS — F319 Bipolar disorder, unspecified: Secondary | ICD-10-CM | POA: Insufficient documentation

## 2014-05-05 DIAGNOSIS — F22 Delusional disorders: Secondary | ICD-10-CM | POA: Insufficient documentation

## 2014-05-05 DIAGNOSIS — F32A Depression, unspecified: Secondary | ICD-10-CM | POA: Diagnosis present

## 2014-05-05 LAB — COMPREHENSIVE METABOLIC PANEL
ALT: 18 U/L (ref 0–35)
AST: 16 U/L (ref 0–37)
Albumin: 4.1 g/dL (ref 3.5–5.2)
Alkaline Phosphatase: 90 U/L (ref 39–117)
Anion gap: 12 (ref 5–15)
BUN: 16 mg/dL (ref 6–23)
CO2: 23 mmol/L (ref 19–32)
Calcium: 9.1 mg/dL (ref 8.4–10.5)
Chloride: 98 mmol/L (ref 96–112)
Creatinine, Ser: 0.93 mg/dL (ref 0.50–1.10)
GFR calc Af Amer: 80 mL/min — ABNORMAL LOW (ref >90)
GFR calc non Af Amer: 69 mL/min — ABNORMAL LOW (ref >90)
Glucose, Bld: 467 mg/dL — ABNORMAL HIGH (ref 70–99)
Potassium: 3.8 mmol/L (ref 3.5–5.1)
Sodium: 133 mmol/L — ABNORMAL LOW (ref 135–145)
Total Bilirubin: 1 mg/dL (ref 0.3–1.2)
Total Protein: 7.6 g/dL (ref 6.0–8.3)

## 2014-05-05 LAB — ACETAMINOPHEN LEVEL: Acetaminophen (Tylenol), Serum: <10 ug/mL — ABNORMAL LOW (ref 10–30)

## 2014-05-05 LAB — CBG MONITORING, ED
Glucose-Capillary: 329 mg/dL — ABNORMAL HIGH (ref 70–99)
Glucose-Capillary: 150 mg/dL — ABNORMAL HIGH (ref 70–99)

## 2014-05-05 LAB — CBC
HCT: 36.6 % (ref 36.0–46.0)
Hemoglobin: 12.4 g/dL (ref 12.0–15.0)
MCH: 30 pg (ref 26.0–34.0)
MCHC: 33.9 g/dL (ref 30.0–36.0)
MCV: 88.6 fL (ref 78.0–100.0)
Platelets: 334 10*3/uL (ref 150–400)
RBC: 4.13 MIL/uL (ref 3.87–5.11)
RDW: 13 % (ref 11.5–15.5)
WBC: 8.9 10*3/uL (ref 4.0–10.5)

## 2014-05-05 LAB — RAPID URINE DRUG SCREEN, HOSP PERFORMED
Amphetamines: NOT DETECTED
Barbiturates: NOT DETECTED
Benzodiazepines: NOT DETECTED
Cocaine: NOT DETECTED
Opiates: NOT DETECTED
Tetrahydrocannabinol: NOT DETECTED

## 2014-05-05 LAB — ETHANOL: Alcohol, Ethyl (B): <5 mg/dL (ref 0–9)

## 2014-05-05 LAB — SALICYLATE LEVEL: Salicylate Lvl: <4 mg/dL (ref 2.8–20.0)

## 2014-05-05 MED ORDER — BUSPIRONE HCL 10 MG PO TABS
10.0000 mg | ORAL_TABLET | Freq: Two times a day (BID) | ORAL | Status: DC
  Administered 2014-05-05 – 2014-05-06 (×3): 10 mg via ORAL
  Filled 2014-05-05 (×3): qty 1

## 2014-05-05 MED ORDER — LORAZEPAM 1 MG PO TABS: 1.0000 mg | ORAL_TABLET | Freq: Three times a day (TID) | ORAL | Status: DC | PRN

## 2014-05-05 MED ORDER — QUETIAPINE FUMARATE 100 MG PO TABS
200.0000 mg | ORAL_TABLET | Freq: Every day | ORAL | Status: DC
  Administered 2014-05-05 – 2014-05-06 (×2): 200 mg via ORAL
  Filled 2014-05-05 (×2): qty 2

## 2014-05-05 MED ORDER — SITAGLIPTIN PHOS-METFORMIN HCL 50-1000 MG PO TABS: 1.0000 | ORAL_TABLET | Freq: Two times a day (BID) | ORAL | Status: DC

## 2014-05-05 MED ORDER — GLYBURIDE 5 MG PO TABS
5.0000 mg | ORAL_TABLET | Freq: Every day | ORAL | Status: DC
  Administered 2014-05-06: 5 mg via ORAL
  Filled 2014-05-05 (×2): qty 1

## 2014-05-05 MED ORDER — METFORMIN HCL 500 MG PO TABS
1000.0000 mg | ORAL_TABLET | Freq: Two times a day (BID) | ORAL | Status: DC
Start: 2014-05-06 — End: 2014-05-06
  Administered 2014-05-06 (×2): 1000 mg via ORAL
  Filled 2014-05-05 (×3): qty 2

## 2014-05-05 MED ORDER — ONDANSETRON HCL 4 MG PO TABS: 4.0000 mg | ORAL_TABLET | Freq: Three times a day (TID) | ORAL | Status: DC | PRN

## 2014-05-05 MED ORDER — SODIUM CHLORIDE 0.9 % IV BOLUS (SEPSIS)
2000.0000 mL | Freq: Once | INTRAVENOUS | Status: AC
  Administered 2014-05-05: 2000 mL via INTRAVENOUS

## 2014-05-05 MED ORDER — TRAZODONE HCL 100 MG PO TABS
100.0000 mg | ORAL_TABLET | Freq: Every day | ORAL | Status: DC
  Administered 2014-05-05: 100 mg via ORAL
  Filled 2014-05-05: qty 1

## 2014-05-05 MED ORDER — LISINOPRIL 5 MG PO TABS
5.0000 mg | ORAL_TABLET | Freq: Every day | ORAL | Status: DC
  Administered 2014-05-05 – 2014-05-06 (×2): 5 mg via ORAL
  Filled 2014-05-05 (×2): qty 1

## 2014-05-05 MED ORDER — INSULIN ASPART 100 UNIT/ML ~~LOC~~ SOLN
12.0000 [IU] | Freq: Once | SUBCUTANEOUS | Status: AC
  Administered 2014-05-05: 12 [IU] via INTRAVENOUS
  Filled 2014-05-05: qty 1

## 2014-05-05 MED ORDER — LORAZEPAM 2 MG/ML IJ SOLN
2.0000 mg | Freq: Once | INTRAMUSCULAR | Status: AC
  Administered 2014-05-05: 2 mg via INTRAMUSCULAR
  Filled 2014-05-05: qty 1

## 2014-05-05 MED ORDER — LINAGLIPTIN 5 MG PO TABS
5.0000 mg | ORAL_TABLET | Freq: Every day | ORAL | Status: DC
  Administered 2014-05-06: 5 mg via ORAL
  Filled 2014-05-05 (×2): qty 1

## 2014-05-05 MED ORDER — SERTRALINE HCL 50 MG PO TABS
100.0000 mg | ORAL_TABLET | Freq: Every day | ORAL | Status: DC
  Administered 2014-05-05 – 2014-05-06 (×2): 100 mg via ORAL
  Filled 2014-05-05 (×2): qty 2
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
## 2014-05-05 NOTE — ED Notes (Signed)
EDP aware of blood sugar results
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
## 2014-05-05 NOTE — ED Notes (Signed)
Pt woke up out of her sleep after she noticed that she had started to urinate on herself. Assisted pt to the bathroom and she told me that she must have been tired. Noticed while helping pt to the bathroom that she was unsteady and needed to have someone assist her. After cleaning pt up she began to cry and told me thank you. When asked why she was crying pt shook her head nevermind and stated that she just wanted to rest. As I was walking out of the room pt asked me about her lab tests and stated that she was told that they would draw a HIV test and she asked me for results. I did not see an order and told pt that we would notify the doctor when they made rounds.

## 2014-05-05 NOTE — ED Notes (Signed)
Bed: WBH41 Expected date:  Expected time:  Means of arrival:  Comments: Triage 4 
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
## 2014-05-05 NOTE — ED Notes (Signed)
Pt presents via EMS with c/o suicide. Pt was [place] the train tracks by police and ran when they got there. Pt reported to police that she wanted to hurt herself. Pt is squealing and talking loudly in triage.

## 2014-05-05 NOTE — ED Notes (Signed)
Pt transferred to room 29

## 2014-05-05 NOTE — ED Notes (Signed)
Pt ambulatory w/o difficulty to room 42 from triage

## 2014-05-06 ENCOUNTER — Inpatient Hospital Stay (HOSPITAL_COMMUNITY)
Admission: AD | Admit: 2014-05-06 | Discharge: 2014-05-11 | DRG: 885 | Disposition: A | Discharge status: Home / Self Care | Payer: Federal, State, Local not specified - Other | Source: Intra-hospital | Attending: Psychiatry | Admitting: Psychiatry

## 2014-05-06 DIAGNOSIS — I10 Essential (primary) hypertension: Secondary | ICD-10-CM | POA: Diagnosis present

## 2014-05-06 DIAGNOSIS — F329 Major depressive disorder, single episode, unspecified: Secondary | ICD-10-CM

## 2014-05-06 DIAGNOSIS — Z79899 Other long term (current) drug therapy: Secondary | ICD-10-CM | POA: Diagnosis not present

## 2014-05-06 DIAGNOSIS — F32A Depression, unspecified: Secondary | ICD-10-CM | POA: Diagnosis present

## 2014-05-06 DIAGNOSIS — E7800 Pure hypercholesterolemia, unspecified: Secondary | ICD-10-CM

## 2014-05-06 DIAGNOSIS — T1491XA Suicide attempt, initial encounter: Secondary | ICD-10-CM | POA: Diagnosis present

## 2014-05-06 DIAGNOSIS — F259 Schizoaffective disorder, unspecified: Secondary | ICD-10-CM | POA: Diagnosis not present

## 2014-05-06 DIAGNOSIS — F322 Major depressive disorder, single episode, severe without psychotic features: Secondary | ICD-10-CM | POA: Diagnosis present

## 2014-05-06 DIAGNOSIS — F410 Panic disorder [episodic paroxysmal anxiety] without agoraphobia: Secondary | ICD-10-CM

## 2014-05-06 DIAGNOSIS — T1491 Suicide attempt: Secondary | ICD-10-CM

## 2014-05-06 DIAGNOSIS — R45851 Suicidal ideations: Secondary | ICD-10-CM

## 2014-05-06 DIAGNOSIS — Z87891 Personal history of nicotine dependence: Secondary | ICD-10-CM

## 2014-05-06 DIAGNOSIS — E119 Type 2 diabetes mellitus without complications: Secondary | ICD-10-CM | POA: Diagnosis present

## 2014-05-06 DIAGNOSIS — F429 Obsessive-compulsive disorder, unspecified: Secondary | ICD-10-CM | POA: Diagnosis present

## 2014-05-06 DIAGNOSIS — F251 Schizoaffective disorder, depressive type: Principal | ICD-10-CM | POA: Diagnosis present

## 2014-05-06 DIAGNOSIS — F332 Major depressive disorder, recurrent severe without psychotic features: Secondary | ICD-10-CM | POA: Diagnosis not present

## 2014-05-06 DIAGNOSIS — E1165 Type 2 diabetes mellitus with hyperglycemia: Secondary | ICD-10-CM | POA: Insufficient documentation

## 2014-05-06 LAB — CBG MONITORING, ED
Glucose-Capillary: 182 mg/dL — ABNORMAL HIGH (ref 70–99)
Glucose-Capillary: 181 mg/dL — ABNORMAL HIGH (ref 70–99)
Glucose-Capillary: 196 mg/dL — ABNORMAL HIGH (ref 70–99)

## 2014-05-06 NOTE — BH Assessment (Signed)
Pt accepted to bed 507-2 at [facility] to be transported after 7 pm. Pt's voluntary form faxed to [facility]. Ruben Reason, MA, [facility], [facility]

## 2014-05-06 NOTE — BH Assessment (Signed)
Inpt recommended. TTS seeking placement: [place], Wayna Chalet, [facility], [place], [place] Triage Specialist 05/06/2014 4:50 AM

## 2014-05-06 NOTE — ED Notes (Signed)
Pt transported to [facility] by [facility] transportation service for continuation of specialized care. Belongings given to driver after patient signed for them. She left in no acute distress.

## 2014-05-06 NOTE — ED Notes (Signed)
CBG 181.  
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
## 2014-05-06 NOTE — ED Notes (Signed)
MD at bedside. TTS Lovena Le MD PRESENT

## 2014-05-06 NOTE — Progress Notes (Signed)
  CARE MANAGEMENT ED NOTE 05/06/2014  Patient:  Lorraine Turner, Lorraine Turner   Account Number:  [id number]  Date Initiated:  05/06/2014  Documentation initiated by:  Jackelyn Poling  Subjective/Objective Assessment:   54 yr old self pay [employer] resident c/o suicide. Pt was [place] on the train tracks by police and ran when they got there. Pt reported to police that she wanted to hurt herself. Pt is squealing and talking loudly in triage.     Subjective/Objective Assessment Detail:   pt confirms she is seen by Dr Elbert Ewings for pcp servies  Pt very pleasant and cooperative during Cm interaction with her  dx Major Depression, Recurrent severe and Schizoaffective Disorder     Action/Plan:   Spoke with pt about pcp see notes below   Action/Plan Detail:   Anticipated DC Date:       Status Recommendation to Physician:   Result of Recommendation:    Other ED Services  Consult [facility] [place]  Other  Outpatient Services - Pt will follow up    Choice offered to / List presented to:            Status of service:  Completed, signed off  ED Comments:   ED Comments Detail:

## 2014-05-06 NOTE — Progress Notes (Signed)
[place] Assessment Progress Note   The following facilities have been contacted in an effort to place this pt, with results as noted:  Beds available, information faxed, decision pending: [facility]  At capacity: [place], Arnold Line Clinician

## 2014-05-06 NOTE — ED Notes (Signed)
Patient denies SI, HI and AVH at this time. Patient reports lots of stressors that has increase her depression. Plan of care discuss with patient. Patient voices no concerns at this time. Encouragement and support provided and safety maintain. Q 15 min safety check remain in place.

## 2014-05-06 NOTE — ED Notes (Signed)
Pt resting quietly in bed with eyes closed. Respirations are even and unlabored. No acute distress noted. Safety has been maintained with q15 minute observation. Will continue current POC

## 2014-05-06 NOTE — BHH Counselor (Signed)
Per Letitia Libra [facility] at [facility], pt has been accepted to [facility] bed 507-2. Pt not to be transported until after 7 pm shift change.  Arnold Long, [place] Therapeutic Triage Specialist

## 2014-05-07 ENCOUNTER — Encounter (HOSPITAL_COMMUNITY): Payer: Self-pay | Admitting: *Deleted

## 2014-05-07 DIAGNOSIS — F251 Schizoaffective disorder, depressive type: Principal | ICD-10-CM

## 2014-05-07 DIAGNOSIS — F259 Schizoaffective disorder, unspecified: Secondary | ICD-10-CM | POA: Diagnosis present

## 2014-05-07 DIAGNOSIS — R45851 Suicidal ideations: Secondary | ICD-10-CM

## 2014-05-07 DIAGNOSIS — F322 Major depressive disorder, single episode, severe without psychotic features: Secondary | ICD-10-CM | POA: Diagnosis present

## 2014-05-07 DIAGNOSIS — F332 Major depressive disorder, recurrent severe without psychotic features: Secondary | ICD-10-CM

## 2014-05-07 LAB — GLUCOSE, CAPILLARY
GLUCOSE-CAPILLARY: 249 mg/dL — AB (ref 70–99)
GLUCOSE-CAPILLARY: 217 mg/dL — AB (ref 70–99)
Glucose-Capillary: 241 mg/dL — ABNORMAL HIGH (ref 70–99)

## 2014-05-07 MED ORDER — BUSPIRONE HCL 10 MG PO TABS
10.0000 mg | ORAL_TABLET | Freq: Two times a day (BID) | ORAL | Status: DC
  Administered 2014-05-07 – 2014-05-11 (×9): 10 mg via ORAL
  Filled 2014-05-07: qty 28
  Filled 2014-05-07 (×5): qty 1
  Filled 2014-05-07: qty 28
  Filled 2014-05-07 (×6): qty 1

## 2014-05-07 MED ORDER — ONDANSETRON HCL 4 MG PO TABS: 4.0000 mg | ORAL_TABLET | Freq: Three times a day (TID) | ORAL | Status: DC | PRN

## 2014-05-07 MED ORDER — QUETIAPINE FUMARATE 200 MG PO TABS
200.0000 mg | ORAL_TABLET | Freq: Every day | ORAL | Status: DC
  Filled 2014-05-07 (×2): qty 1

## 2014-05-07 MED ORDER — LINAGLIPTIN 5 MG PO TABS
5.0000 mg | ORAL_TABLET | Freq: Every day | ORAL | Status: DC
  Administered 2014-05-07 – 2014-05-11 (×5): 5 mg via ORAL
  Filled 2014-05-07 (×8): qty 1

## 2014-05-07 MED ORDER — SERTRALINE HCL 50 MG PO TABS
50.0000 mg | ORAL_TABLET | Freq: Every day | ORAL | Status: DC
  Administered 2014-05-08 – 2014-05-09 (×2): 50 mg via ORAL
  Filled 2014-05-07 (×5): qty 1

## 2014-05-07 MED ORDER — LISINOPRIL 5 MG PO TABS
5.0000 mg | ORAL_TABLET | Freq: Every day | ORAL | Status: DC
  Administered 2014-05-08 – 2014-05-11 (×4): 5 mg via ORAL
  Filled 2014-05-07 (×3): qty 1
  Filled 2014-05-07: qty 14
  Filled 2014-05-07 (×3): qty 1

## 2014-05-07 MED ORDER — MAGNESIUM HYDROXIDE 400 MG/5ML PO SUSP: 30.0000 mL | Freq: Every day | ORAL | Status: DC | PRN

## 2014-05-07 MED ORDER — METFORMIN HCL 500 MG PO TABS
1000.0000 mg | ORAL_TABLET | Freq: Two times a day (BID) | ORAL | Status: DC
  Administered 2014-05-07 – 2014-05-11 (×9): 1000 mg via ORAL
  Filled 2014-05-07 (×13): qty 2

## 2014-05-07 MED ORDER — QUETIAPINE FUMARATE 50 MG PO TABS
150.0000 mg | ORAL_TABLET | Freq: Every day | ORAL | Status: DC
  Administered 2014-05-07 – 2014-05-11 (×4): 150 mg via ORAL
  Filled 2014-05-07 (×8): qty 1

## 2014-05-07 MED ORDER — GLYBURIDE 5 MG PO TABS
5.0000 mg | ORAL_TABLET | Freq: Every day | ORAL | Status: DC
  Administered 2014-05-07 – 2014-05-11 (×5): 5 mg via ORAL
  Filled 2014-05-07 (×5): qty 1
  Filled 2014-05-07: qty 14
  Filled 2014-05-07 (×2): qty 1

## 2014-05-07 MED ORDER — LORAZEPAM 1 MG PO TABS
1.0000 mg | ORAL_TABLET | Freq: Three times a day (TID) | ORAL | Status: DC | PRN
  Administered 2014-05-07 – 2014-05-09 (×2): 1 mg via ORAL
  Filled 2014-05-07 (×2): qty 1

## 2014-05-07 MED ORDER — SERTRALINE HCL 100 MG PO TABS
100.0000 mg | ORAL_TABLET | Freq: Every day | ORAL | Status: DC
  Administered 2014-05-07: 100 mg via ORAL
  Filled 2014-05-07 (×3): qty 1

## 2014-05-07 MED ORDER — ALUM & MAG HYDROXIDE-SIMETH 200-200-20 MG/5ML PO SUSP: 30.0000 mL | ORAL | Status: DC | PRN

## 2014-05-07 MED ORDER — TRAZODONE HCL 100 MG PO TABS
100.0000 mg | ORAL_TABLET | Freq: Every day | ORAL | Status: DC
  Administered 2014-05-07 – 2014-05-09 (×2): 100 mg via ORAL
  Filled 2014-05-07: qty 14
  Filled 2014-05-07 (×6): qty 1

## 2014-05-07 MED ORDER — ACETAMINOPHEN 325 MG PO TABS
650.0000 mg | ORAL_TABLET | Freq: Four times a day (QID) | ORAL | Status: DC | PRN
Start: 2014-05-07 — End: 2014-05-11

## 2014-05-07 NOTE — BHH Group Notes (Signed)
[place] Group Notes: (Clinical Social Work)   05/07/2014      Type of Therapy:  Group Therapy   Participation Level:  Did Not Attend despite MHT prompting   Selmer Dominion, LCSW 05/07/2014, 12:31 PM

## 2014-05-07 NOTE — BHH Group Notes (Signed)
The focus of this group is to educate the patient on the purpose and policies of crisis stabilization and provide a format to answer questions about their admission.  The group details unit policies and expectations of patients while admitted.  Patient attended 0900 nurse education orientation group this morning.  Patient actively participated, appropriate affect, alert, appropriate insight and engagement.  Today patient will work on 3 goals for discharge.  
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
## 2014-05-07 NOTE — Progress Notes (Signed)
Admission Note:  D:   Patient is 54 year female who presents voluntarily in no acute distress for the treatment of SI and Depression. Pt appears delirious, speech incoherent and unsteady gait. Excessive make up with the eye lashes falling off. Patient was cooperative with admission process. Pt denies pain, SI/HI/AVH at this time.  A:Skin was assessed, no wounds, rashes, tattoos noted. POC and unit policies explained and understanding verbalized. Consents obtained. Food and fluids offered.  R: Pt had no additional questions or concerns.

## 2014-05-07 NOTE — Progress Notes (Signed)
Patient ID: Lorraine Turner, female   DOB: [birth year], 54 y.o.   MRN: [id number]   D: Pt has been very flat and depressed on the unit today. Pt has also been very isolative and remained in her room most of the day. Pt reported that she did not want to go to lunch and that she just wanted to be alone with all the problems on the unit. Pt reported that she felt uncomfortable, this writer assured patient that she did not have to worry about her safety. Pt reported being negative SI/HI, no AH/VH noted. A: 15 min checks continued for patient safety. R: Pt safety maintained.
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
## 2014-05-07 NOTE — Progress Notes (Addendum)
D: Pt denies SI/HI/AVH. Pt is pleasant and cooperative. Pt concerned about her significant other because she wants to be with him, but he is on drugs and pt can't handle that. Pt becomes hyper -religious, and confabulates at times, with flight of ideas and tangential speech, but pt very redirectable .   A: Pt was offered support and encouragement. Pt was given scheduled medications. Pt was encourage to attend groups. Q 15 minute checks were done for safety.   R:Pt attends groups and interacts well with peers and staff. Pt is taking medication. Pt receptive to treatment and safety maintained on unit. Pt in better spirits after talk, pt said she really enjoyed talking with Probation officer.
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
## 2014-05-08 DIAGNOSIS — F259 Schizoaffective disorder, unspecified: Secondary | ICD-10-CM

## 2014-05-08 LAB — GLUCOSE, CAPILLARY
GLUCOSE-CAPILLARY: 124 mg/dL — AB (ref 70–99)
Glucose-Capillary: 287 mg/dL — ABNORMAL HIGH (ref 70–99)
Glucose-Capillary: 109 mg/dL — ABNORMAL HIGH (ref 70–99)

## 2014-05-08 LAB — TSH: TSH: 1.692 u[IU]/mL (ref 0.350–4.500)

## 2014-05-08 LAB — LIPID PANEL
CHOLESTEROL: 225 mg/dL — AB (ref 0–200)
HDL: 40 mg/dL (ref >39)
LDL CALC: 151 mg/dL — AB (ref 0–99)
TRIGLYCERIDES: 169 mg/dL — AB (ref <150)
Total CHOL/HDL Ratio: 5.6 RATIO
VLDL: 34 mg/dL (ref 0–40)

## 2014-05-08 MED ORDER — INSULIN ASPART 100 UNIT/ML ~~LOC~~ SOLN
0.0000 [IU] | Freq: Three times a day (TID) | SUBCUTANEOUS | Status: DC
  Administered 2014-05-08: 8 [IU] via SUBCUTANEOUS
  Administered 2014-05-09: 2 [IU] via SUBCUTANEOUS
  Administered 2014-05-09: 3 [IU] via SUBCUTANEOUS

## 2014-05-08 NOTE — Progress Notes (Signed)
Writer attempted to introduce self to pt. Pt observed sleeping at this time. Pt do not appear to be in distress. Pt safety maintained.

## 2014-05-08 NOTE — [facility] Group Notes (Signed)
[place] Group Notes:  (Nursing/MHT/Case Management/Adjunct)  Date:  05/08/2014  Time:  12:10 PM  Type of Therapy:  Psychoeducational Skills  Participation Level:  Minimal  Participation Quality:  Resistant  Affect:  Resistant  Cognitive:  Disorganized  Insight:  Lacking  Engagement in Group:  Lacking  Modes of Intervention:  Problem-solving  Summary of Progress/Problems: Pt attended patient self inventory group.   Lorraine Turner Shanta 05/08/2014, 12:10 PM
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
## 2014-05-08 NOTE — Progress Notes (Signed)
D: Pt denies SI/HI/AVH. Pt is pleasant and cooperative. Pt refused labs initially this evening, but agreed to get them done. Pt presented with bright affect, very forthcoming with information and seems very hopeful.   A: Pt was offered support and encouragement. Pt was given scheduled medications. Pt was encourage to attend groups. Q 15 minute checks were done for safety. Pt was educated on the importance of getting blood drawn to monitor various things going on.   R:Pt attends groups and interacts well with peers and staff. Pt is taking medication. Pt has no complaints at this time.Pt receptive to treatment and safety maintained on unit.

## 2014-05-08 NOTE — Progress Notes (Signed)
Patient ID: Lorraine Turner, female   DOB: 21-Apr-1960, 54 y.o.   MRN: [id number]   D: Pt has been appropriate on the unit today, she has attended all groups and engaged in treatment. Pt reported that she was feeling much better, pt reported that she was also happy about her blood sugars. Pt reported that her goal was to continue to get better. Pt reported being negative SI/HI, no AH/VH noted. A: 15 min checks continued for patient safety. R: Pt safety maintained.
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
## 2014-05-08 NOTE — BHH Group Notes (Signed)
[facility] Group Notes:  (Clinical Social Work)  05/08/2014   11:15am-12:00pm  Summary of Progress/Problems:  The main focus of today's process group was to listen to a variety of genres of music and to identify that different types of music provoke different responses.  The patient then was able to identify personally what was soothing for them, as well as energizing.  Handouts were used to record feelings evoked, as well as how patient can personally use this knowledge in sleep habits, with depression, and with other symptoms.  The patient expressed understanding of concepts, as well as knowledge of how each type of music affected him/her and how this can be used at home as a wellness/recovery tool.  Type of Therapy:  Music Therapy   Participation Level:  Active  Participation Quality:  Attentive and Sharing  Affect:  Blunted  Cognitive:  Oriented  Insight:  Engaged  Engagement in Therapy:  Engaged  Modes of Intervention:   Activity, Exploration  Selmer Dominion, LCSW 05/08/2014, 12:30pm

## 2014-05-09 ENCOUNTER — Encounter (HOSPITAL_COMMUNITY): Payer: Self-pay | Admitting: Nurse Practitioner

## 2014-05-09 LAB — GLUCOSE, CAPILLARY
GLUCOSE-CAPILLARY: 154 mg/dL — AB (ref 70–99)
Glucose-Capillary: 85 mg/dL (ref 70–99)
Glucose-Capillary: 174 mg/dL — ABNORMAL HIGH (ref 70–99)
Glucose-Capillary: 132 mg/dL — ABNORMAL HIGH (ref 70–99)
Glucose-Capillary: 111 mg/dL — ABNORMAL HIGH (ref 70–99)

## 2014-05-09 LAB — HEMOGLOBIN A1C
Hgb A1c MFr Bld: 14 % — ABNORMAL HIGH (ref 4.8–5.6)
Mean Plasma Glucose: 355 mg/dL

## 2014-05-09 MED ORDER — SERTRALINE HCL 100 MG PO TABS
100.0000 mg | ORAL_TABLET | Freq: Every day | ORAL | Status: DC
  Administered 2014-05-10 – 2014-05-11 (×2): 100 mg via ORAL
  Filled 2014-05-09 (×3): qty 1
  Filled 2014-05-09: qty 14

## 2014-05-09 NOTE — Progress Notes (Signed)
Patient ID: Lorraine Turner, female   DOB: 07/08/1960, 54 y.o.   MRN: [id number] [facility] MD Progress Note  05/09/2014 5:43 PM [place]  MRN:  [id number] Subjective:   Patient reports she is doing " all right"   Objective : Discussed case with treatment team and met with patient. As per staff patient  Had initially been somewhat irritable at times, tending to stay in bed , needing encouragement to participate in milieu, but is improving, presenting better related and becoming more engaged in milieu. Patient reports she is feeling much better and at this time denies depression. States " I am doing better, I feel good again". Denies medication side effects. No disruptive behaviors on the unit . Patient does not endorse major or significant neuro-vegetative symptoms of depression and denies any SI at this time. Labs reviewed, TSH WNL, cholesterol elevated at 225, HgbA1C elevated at 14  Principal Problem: Schizoaffective disorder Diagnosis:   Patient Active Problem List   Diagnosis Date Noted  . 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 Schizoaffective disorder [F25.9] 05/07/2014  . Suicide attempt [T14.91] 05/06/2014  . Psychosis [F29] 08/30/2013  . Schizoaffective disorder, depressive type [F25.1] 08/14/2011  . Noncompliance with medication regimen [Z91.14] 08/14/2011  . UNSPECIFIED ANEMIA [D64.9] 01/23/2009  . HYPERTENSION [I10] 10/12/2008  . SINUSITIS, ACUTE [J01.90] 11/13/2007  . TRICHOMONAL VAGINITIS [A59.01] 07/09/2007  . DIABETES MELLITUS, TYPE II, UNCONTROLLED [E11.65] 01/01/2007  . HYPERCHOLESTEROLEMIA [E78.0] 01/01/2007  . PANIC DISORDER [F41.0] 01/01/2007  . OBSESSIVE-COMPULSIVE DISORDER [F42] 01/01/2007   Total Time spent with patient: 25 minutes    Past Medical History:  Past Medical History  Diagnosis Date  . Hypertension   . Diabetes mellitus   . Bipolar 1 disorder   . Schizophrenia   . Anxiety   . Depression    History  reviewed. No pertinent past surgical history. Family History: History reviewed. No pertinent family history. Social History:  History  Alcohol Use No    Comment: every weekend     History  Drug Use No    History   Social History  . Marital Status: Married    Spouse Name: N/A  . Number of Children: N/A  . Years of Education: N/A   Social History Main Topics  . Smoking status: Former Smoker    Types: Cigars    Quit date: 03/14/2013  . Smokeless tobacco: Not on file  . Alcohol Use: No     Comment: every weekend  . Drug Use: No  . Sexual Activity: Yes   Other Topics Concern  . None   Social History Narrative   Additional History:    Sleep: improving   Appetite:   States it is better    Assessment:   Musculoskeletal: Strength & Muscle Tone: within normal limits Gait & Station: normal Patient leans: N/A   Psychiatric Specialty Exam: Physical Exam  Vitals reviewed. Psychiatric: Her mood appears anxious.    Review of Systems  Constitutional: Negative for fever and chills.  Respiratory: Negative for cough and shortness of breath.   Cardiovascular: Negative for chest pain.  Gastrointestinal: Negative for vomiting.    Blood pressure 123/70, pulse 81, temperature 97.8 F (36.6 C), temperature source Oral, resp. rate 16, height '5\' 2"'  (1.575 m), weight 151 lb (68.493 kg), SpO2 98 %.Body mass index is 27.61 kg/(m^2).  General Appearance: Fairly Groomed  Engineer, water::  Good  Speech:  Normal Rate  Volume:  Normal  Mood:  improving, feeling better,  less depressed   Affect:  Appropriate, smiles at times appropriately  Thought Process:  Linear  Orientation:  Full (Time, Place, and Person)  Thought Content:   Denies hallucinations, no delusions, does not appear internally preoccupied   Suicidal Thoughts:  No- denies any thoughts of hurting self or anyone else   Homicidal Thoughts:  No  Memory:  Immediate;   Fair Recent;   Fair Remote;   Fair  Judgement:  Fair   Insight:  Fair  Psychomotor Activity:  Normal- no psychomotor agitation at this time  Concentration:  Fair  Recall:  [place]: Fair  Akathisia:  Yes  Handed:  Right  AIMS (if indicated):     Assets:  Desire for Improvement Physical Health Resilience Social Support  ADL's:  Intact  Cognition: WNL  Sleep:        Current Medications: Current Facility-Administered Medications  Medication Dose Route Frequency Provider Last Rate Last Dose  . acetaminophen (TYLENOL) tablet 650 mg  650 mg Oral Q6H PRN Patrecia Pour, NP      . alum & mag hydroxide-simeth (MAALOX/MYLANTA) 200-200-20 MG/5ML suspension 30 mL  30 mL Oral Q4H PRN Patrecia Pour, NP      . busPIRone (BUSPAR) tablet 10 mg  10 mg Oral BID Patrecia Pour, NP   10 mg at 05/09/14 1703  . glyBURIDE (DIABETA) tablet 5 mg  5 mg Oral Q breakfast Patrecia Pour, NP   5 mg at 05/09/14 0846  . insulin aspart (novoLOG) injection 0-15 Units  0-15 Units Subcutaneous TID WC Kerrie Buffalo, NP   2 Units at 05/09/14 1705  . linagliptin (TRADJENTA) tablet 5 mg  5 mg Oral QAC breakfast Patrecia Pour, NP   5 mg at 05/09/14 1700   And  . metFORMIN (GLUCOPHAGE) tablet 1,000 mg  1,000 mg Oral BID WC Patrecia Pour, NP   1,000 mg at 05/09/14 1703  . lisinopril (PRINIVIL,ZESTRIL) tablet 5 mg  5 mg Oral Daily Patrecia Pour, NP   5 mg at 05/09/14 0846  . LORazepam (ATIVAN) tablet 1 mg  1 mg Oral Q8H PRN Patrecia Pour, NP   1 mg at 05/07/14 1934  . magnesium hydroxide (MILK OF MAGNESIA) suspension 30 mL  30 mL Oral Daily PRN Patrecia Pour, NP      . ondansetron [facility]) tablet 4 mg  4 mg Oral Q8H PRN Patrecia Pour, NP      . QUEtiapine (SEROQUEL) tablet 150 mg  150 mg Oral QHS Jenne Campus, MD   150 mg at 05/08/14 2300  . sertraline (ZOLOFT) tablet 50 mg  50 mg Oral Daily Jenne Campus, MD   50 mg at 05/09/14 0846  . traZODone (DESYREL) tablet 100 mg  100 mg Oral QHS Patrecia Pour, NP   100 mg at 05/07/14 2115     Lab Results:  Results for orders placed or performed during the hospital encounter of 05/06/14 (from the past 48 hour(s))  Glucose, capillary     Status: Abnormal   Collection Time: 05/08/14  6:10 AM  Result Value Ref Range   Glucose-Capillary 124 (H) 70 - 99 mg/dL   Comment 1 Notify RN   Hemoglobin A1c     Status: Abnormal   Collection Time: 05/08/14  6:21 AM  Result Value Ref Range   Hgb A1c MFr Bld 14.0 (H) 4.8 - 5.6 %    Comment: (NOTE)  Pre-diabetes: 5.7 - 6.4         Diabetes: >6.4         Glycemic control for adults with diabetes: <7.0    Mean Plasma Glucose 355 mg/dL    Comment: (NOTE) Performed At: [facility] [place], [place] [id number] Lindon Romp MD RA:[id number] Performed at [facility]   TSH     Status: None   Collection Time: 05/08/14  6:21 AM  Result Value Ref Range   TSH 1.692 0.350 - 4.500 uIU/mL    Comment: Performed at [facility]  Lipid panel     Status: Abnormal   Collection Time: 05/08/14  6:30 AM  Result Value Ref Range   Cholesterol 225 (H) 0 - 200 mg/dL   Triglycerides 169 (H) <150 mg/dL   HDL 40 >39 mg/dL   Total CHOL/HDL Ratio 5.6 RATIO   VLDL 34 0 - 40 mg/dL   LDL Cholesterol 151 (H) 0 - 99 mg/dL    Comment:        Total Cholesterol/HDL:CHD Risk Coronary Heart Disease Risk Table                     Men   Women  1/2 Average Risk   3.4   3.3  Average Risk       5.0   4.4  2 X Average Risk   9.6   7.1  3 X Average Risk  23.4   11.0        Use the calculated Patient Ratio above and the CHD Risk Table to determine the patient's CHD Risk.        ATP III CLASSIFICATION (LDL):  <100     mg/dL   Optimal  100-129  mg/dL   Near or Above                    Optimal  130-159  mg/dL   Borderline  160-189  mg/dL   High  >190     mg/dL   Very High Performed at [facility]   Glucose, capillary     Status: Abnormal   Collection Time: 05/08/14  4:15 PM  Result  Value Ref Range   Glucose-Capillary 287 (H) 70 - 99 mg/dL  Glucose, capillary     Status: Abnormal   Collection Time: 05/08/14  9:30 PM  Result Value Ref Range   Glucose-Capillary 109 (H) 70 - 99 mg/dL  Glucose, capillary     Status: None   Collection Time: 05/09/14  6:38 AM  Result Value Ref Range   Glucose-Capillary 85 70 - 99 mg/dL  Glucose, capillary     Status: Abnormal   Collection Time: 05/09/14 11:55 AM  Result Value Ref Range   Glucose-Capillary 174 (H) 70 - 99 mg/dL  Glucose, capillary     Status: Abnormal   Collection Time: 05/09/14 12:51 PM  Result Value Ref Range   Glucose-Capillary 154 (H) 70 - 99 mg/dL  Glucose, capillary     Status: Abnormal   Collection Time: 05/09/14  5:03 PM  Result Value Ref Range   Glucose-Capillary 132 (H) 70 - 99 mg/dL   Physical Findings: AIMS:  , ,  ,  ,    CIWA:    COWS:      Assessment- patient improving, less depressed and affect reactive , brighter, not endorsing any SI, tolerating medications well at this time. We have discussed the importance of  medication compliance , as patient links recent exacerbation of psychiatric symptoms and elevated HgbA1C to medication non compliance for several weeks to months . Patient interested in titrating Zoloft, which she is tolerating well. Treatment Plan Summary: Daily contact with patient to assess and evaluate symptoms and progress in treatment and Medication management  Dr. Shea Evans will evaluate in AM. Buspar 10 mgrs BID Increase Zoloft to 100 mgrs QDAY  Seroquel 150 mgrs QHS  Medical Decision Making:  Discuss test with performing physician (1), Decision to obtain old records (1), Review and summation of old records (2), Review of Medication Regimen & Side Effects (2) and Review of New Medication or Change in Dosage (2)  Kelicia Youtz AGNP-BC 05/09/2014, 5:43 PM
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
## 2014-05-09 NOTE — BHH Group Notes (Signed)
[facility] LCSW Aftercare Discharge Planning Group Note   05/09/2014 3:35 PM  Participation Quality:  Engaged  Mood/Affect:  Flat  Depression Rating:  0  Anxiety Rating:  0  Thoughts of Suicide:  No Will you contract for safety?   NA  Current AVH:  No  Plan for Discharge/Comments:  Lorraine Turner denies all symptoms today, but admits to having been off of her meds "for a minute" and experiencing depression with accompanying SI previous to admission.  Identified stressors of finances, medical issues and possible relocation out of state.  Despite all this, states that she is doing much better since she came in 2 days ago, and is hoping for a quick turnaround.  Transportation Means: bus  Supports:  family  [place], [name] B

## 2014-05-09 NOTE — BHH Group Notes (Signed)
[facility] LCSW Group Therapy  05/09/2014 1:15 pm  Type of Therapy: Process Group Therapy  Participation Level:  Active  Participation Quality:  Appropriate  Affect:  Flat  Cognitive:  Oriented  Insight:  Improving  Engagement in Group:  Limited  Engagement in Therapy:  Limited  Modes of Intervention:  Activity, Clarification, Education, Problem-solving and Support  Summary of Progress/Problems: Today's group addressed the issue of overcoming obstacles.  Patients were asked to identify their biggest obstacle post d/c that stands in the way of their on-going success, and then problem solve as to how to manage this.  Lorraine Turner identified many obstacles, including finances, lack of motivation, negative people, no job, etc.  She wanted to dissect each one, which subsequently led her to identifying another obstacle.  Ultimately, after hearing another patient, she agreed that stopping her medication leads her to make poor decisions and affects her motivation.  She expressed a new found desire to stay on meds.  [name] 05/09/2014   3:39 PM

## 2014-05-09 NOTE — Progress Notes (Signed)
D: Pt denies SI/HI/AVH. Pt is pleasant and cooperative. Pt plans to stay on her medications, stop procrastinating and go to all her appointments when she leaves.   A: Pt was offered support and encouragement. Pt was given scheduled medications. Pt was encourage to attend groups. Q 15 minute checks were done for safety.   R:Pt attends groups and interacts well with peers and staff. Pt is taking medication. Pt has no complaints at this time .Pt receptive to treatment and safety maintained on unit.

## 2014-05-09 NOTE — Plan of Care (Signed)
Problem: Alteration in mood Goal: LTG-Patient reports reduction in suicidal thoughts (Patient reports reduction in suicidal thoughts and is able to verbalize a safety plan for whenever patient is feeling suicidal)  Outcome: Completed/Met Date Met:  05/09/14 Pt denies SI at this time, pt contracts for safety  Problem: Diagnosis: Increased Risk For Suicide Attempt Goal: LTG-Patient Will Report Improved Mood and Deny Suicidal LTG (by discharge) Patient will report improved mood and deny suicidal ideation.  Outcome: Completed/Met Date Met:  05/09/14 Pt stated she was feeling much better and denies SI at this time Goal: LTG-Patient Will Report Absence of Withdrawal Symptoms LTG (by discharge): Patient will report absence of withdrawal symptoms.  Outcome: Completed/Met Date Met:  05/09/14 Pt does not endorse any withdrawal Sx

## 2014-05-09 NOTE — Progress Notes (Signed)
D:  Per pt self inventory pt reports sleeping good last night without sleep medication, appetite fair, energy level normal, ability to pay attention good, rates depression at a 0 out of 10, hopelessness at a 0 out of 10, anxiety at a 0 out of 10, denies SI/HI/AVH, pt's goal for today is to "stay on my medication", pt felt "shaky at lunch and had to come back early, see flowsheets and results for VS and CBG--both we stable, pt stated that she thinks she may have "over done it" when she was outside playing basketball right before lunch time and pt felt much better after lying down in room in bed for a short while after she returned to the unit.    A:  Emotional support provided, Encouraged pt to continue with treatment plan and attend all group activities, q15 min checks maintained for safety.  R:  Pt is cooperative and pleasant, going to groups.

## 2014-05-09 NOTE — Progress Notes (Signed)
Inpatient Diabetes Program Recommendations  AACE/ADA: New Consensus Statement on Inpatient Glycemic Control (2013)  Target Ranges:  Prepandial:   less than 140 mg/dL      Peak postprandial:   less than 180 mg/dL (1-2 hours)      Critically ill patients:  140 - 180 mg/dL   Reason for Assessment: Diabetes Consult  Diabetes history: DM2 Outpatient Diabetes medications: Janumet 50/1000 mg bid, glipizide 5 mg QAM Current orders for Inpatient glycemic control: tradjenta 5 mg QD, metformin 1000 mg bid, glyburide 5 mg QAM, Novolog moderate tidwc  Results for MILEA, VERNI (MRN [id number]) as of 05/09/2014 11:18  Ref. Range 05/07/2014 16:53 05/08/2014 06:10 05/08/2014 16:15 05/08/2014 21:30 05/09/2014 06:38  Glucose-Capillary Latest Range: 70-99 mg/dL 217 (H) 124 (H) 287 (H) 109 (H) 85  Results for BOYD, MURACO (MRN [id number]) as of 05/09/2014 11:18  Ref. Range 05/08/2014 06:21  Hemoglobin A1C Latest Range: 4.8-5.6 % 14.0 (H)   HgbA1C of 14 indicates uncontrolled DM prior to admission.  CBGs yesterday acceptable - ? Where is lunchtime blood sugar? FBS excellent this am. Would not start basal insulin at this time. If FBS > 180 mg/dL, may consider addition of Levemir 10 units QD  Inpatient Diabetes Program Recommendations Insulin - Basal: If FBS begins to trend >180 mg/dL, add Levemir 10 units QAM Correction (SSI): Add HS correction HgbA1C: 14% uncontrolled Outpatient Referral: Would benefit from OP Diabetes Education consult for uncontrolled DM  Will need PCP to manage DM after discharge.  Case manager consult for obtaining PCP ([place]) - would receive diabetes education at [facility] also. Needs glucose meter prescription at discharge - can obtain at [facility] if appt is made.  Will continue to follow while inpatient. Thank you. Lorenda Peck, RD, LDN, CDE Inpatient Diabetes Coordinator [phone]

## 2014-05-10 LAB — HEMOGLOBIN A1C
HEMOGLOBIN A1C: 13.9 % — AB (ref 4.8–5.6)
Mean Plasma Glucose: 352 mg/dL

## 2014-05-10 LAB — GLUCOSE, CAPILLARY
GLUCOSE-CAPILLARY: 113 mg/dL — AB (ref 70–99)
GLUCOSE-CAPILLARY: 120 mg/dL — AB (ref 70–99)
Glucose-Capillary: 190 mg/dL — ABNORMAL HIGH (ref 70–99)
Glucose-Capillary: 76 mg/dL (ref 70–99)

## 2014-05-10 MED ORDER — INSULIN ASPART 100 UNIT/ML ~~LOC~~ SOLN: 0.0000 [IU] | Freq: Every day | SUBCUTANEOUS | Status: DC

## 2014-05-10 MED ORDER — INSULIN ASPART 100 UNIT/ML ~~LOC~~ SOLN
0.0000 [IU] | Freq: Three times a day (TID) | SUBCUTANEOUS | Status: DC
  Administered 2014-05-10 – 2014-05-11 (×2): 2 [IU] via SUBCUTANEOUS

## 2014-05-10 NOTE — [facility] Group Notes (Signed)
[place] LCSW Group Therapy  05/10/2014 1:20 PM   Type of Therapy:  Group Therapy  Participation Level:  Active  Participation Quality:  Attentive  Affect:  Appropriate  Cognitive:  Appropriate  Insight:  Improving  Engagement in Therapy:  Engaged  Modes of Intervention:  Clarification, Education, Exploration and Socialization  Summary of Progress/Problems: Today's group focused on relapse prevention.  We defined the term, and then brainstormed on ways to prevent relapse.  Lorraine Turner was an active participant and stayed for the entire group. She talked about relapse in terms of "digging myself a hole" and talked about some poor decisions that she has made that led her to decompensate,  Including not taking medication and allowing worry to take over her thoughts.  She swears it will be different going forward.  Roque Lias B 05/10/2014 , 1:20 PM

## 2014-05-10 NOTE — Progress Notes (Addendum)
Patient ID: Lorraine Turner, female   DOB: 07-26-1960, 54 y.o.   MRN: [id number] [facility] MD Progress Note  05/10/2014 2:03 PM [place]  MRN:  [id number] Subjective:   Patient states " I am doing OK.'Patient states a hx of Bipolar as well as OCD.Pt reports that she is obsessed with cleaning and always buys a set amount of bleach , if one bottle gets empty ,she goes out and buys one to replace it ,so that she can have a particular amount of bleach bottles.She reports that recent admission is due to a variety of psychosocial stressors like financial issues and her health issues."   Objective : Discussed case with treatment team and met with patient. Patient today found in bed , appears to be less anxious and less depressed. Pt with improved mood , has been attending groups . Patient denies SI/HI/AH/VH. Pt discussed her concerns about her DM ,which is uncontrolled , on admission Hba1c was 14. Pt seen by Diabetic coordinator and will be referred to [facility] on discharge. Pt has been attending groups and interacting well with staff and peers. Pt denies any side effects of medications.       Principal Problem: Schizoaffective disorder, depressive type Diagnosis:   Patient Active Problem List   Diagnosis Date Noted  . Diabetes mellitus [E11.9] 05/10/2014  . Schizoaffective disorder, depressive type [F25.1] 08/14/2011  . UNSPECIFIED ANEMIA [D64.9] 01/23/2009  . HYPERTENSION [I10] 10/12/2008  . HYPERCHOLESTEROLEMIA [E78.0] 01/01/2007  . PANIC DISORDER [F41.0] 01/01/2007  . Obsessive compulsive disorder [F42] 01/01/2007   Total Time spent with patient: 25 minutes    Past Medical History:  Past Medical History  Diagnosis Date  . Hypertension   . Diabetes mellitus   . Bipolar 1 disorder   . Schizophrenia   . Anxiety   . Depression    History reviewed. No pertinent past surgical history. Family History: History reviewed. No pertinent family history. Social History:  History  Alcohol  Use No    Comment: every weekend     History  Drug Use No    History   Social History  . Marital Status: Married    Spouse Name: N/A  . Number of Children: N/A  . Years of Education: N/A   Social History Main Topics  . Smoking status: Former Smoker    Types: Cigars    Quit date: 03/14/2013  . Smokeless tobacco: Not on file  . Alcohol Use: No     Comment: every weekend  . Drug Use: No  . Sexual Activity: Yes   Other Topics Concern  . None   Social History Narrative   Additional History:    Sleep: improving   Appetite:   States it is better     Musculoskeletal: Strength & Muscle Tone: within normal limits Gait & Station: normal Patient leans: N/A   Psychiatric Specialty Exam: Physical Exam  Vitals reviewed. Psychiatric: Her mood appears anxious.    Review of Systems  Psychiatric/Behavioral: The patient is nervous/anxious (IMPROVING).     Blood pressure 123/70, pulse 81, temperature 97.8 F (36.6 C), temperature source Oral, resp. rate 16, height _0  (1.575 m), weight 68.493 kg (151 lb), SpO2 98 %.Body mass index is 27.61 kg/(m^2).  General Appearance: Fairly Groomed  Engineer, water::  Good  Speech:  Normal Rate  Volume:  Normal  Mood:  improving, feeling better, less depressed   Affect:  Appropriate, smiles at times appropriately  Thought Process:  Linear  Orientation:  Full (Time,  Place, and Person)  Thought Content:   Denies hallucinations, no delusions, does not appear internally preoccupied   Suicidal Thoughts:  No- denies any thoughts of hurting self or anyone else   Homicidal Thoughts:  No  Memory:  Immediate;   Fair Recent;   Fair Remote;   Fair  Judgement:  Fair  Insight:  Fair  Psychomotor Activity:  Normal- no psychomotor agitation at this time  Concentration:  Fair  Recall:  [place]: Fair  Akathisia:  Yes  Handed:  Right  AIMS (if indicated):     Assets:  Desire for Improvement Physical  Health Resilience Social Support  ADL's:  Intact  Cognition: WNL  Sleep:        Current Medications: Current Facility-Administered Medications  Medication Dose Route Frequency Provider Last Rate Last Dose  . acetaminophen (TYLENOL) tablet 650 mg  650 mg Oral Q6H PRN Patrecia Pour, NP      . alum & mag hydroxide-simeth (MAALOX/MYLANTA) 200-200-20 MG/5ML suspension 30 mL  30 mL Oral Q4H PRN Patrecia Pour, NP      . busPIRone (BUSPAR) tablet 10 mg  10 mg Oral BID Patrecia Pour, NP   10 mg at 05/10/14 0820  . glyBURIDE (DIABETA) tablet 5 mg  5 mg Oral Q breakfast Patrecia Pour, NP   5 mg at 05/10/14 9470  . insulin aspart (novoLOG) injection 0-5 Units  0-5 Units Subcutaneous QHS Katharina Jehle, MD      . insulin aspart (novoLOG) injection 0-9 Units  0-9 Units Subcutaneous TID WC Saniyah Mondesir, MD   0 Units at 05/10/14 1200  . linagliptin (TRADJENTA) tablet 5 mg  5 mg Oral QAC breakfast Patrecia Pour, NP   5 mg at 05/10/14 0820   And  . metFORMIN (GLUCOPHAGE) tablet 1,000 mg  1,000 mg Oral BID WC Patrecia Pour, NP   1,000 mg at 05/10/14 9628  . lisinopril (PRINIVIL,ZESTRIL) tablet 5 mg  5 mg Oral Daily Patrecia Pour, NP   5 mg at 05/10/14 0843  . LORazepam (ATIVAN) tablet 1 mg  1 mg Oral Q8H PRN Patrecia Pour, NP   1 mg at 05/09/14 2138  . magnesium hydroxide (MILK OF MAGNESIA) suspension 30 mL  30 mL Oral Daily PRN Patrecia Pour, NP      . ondansetron [facility]) tablet 4 mg  4 mg Oral Q8H PRN Patrecia Pour, NP      . QUEtiapine (SEROQUEL) tablet 150 mg  150 mg Oral QHS Jenne Campus, MD   150 mg at 05/09/14 2138  . sertraline (ZOLOFT) tablet 100 mg  100 mg Oral Daily Jenne Campus, MD   100 mg at 05/10/14 3662  . traZODone (DESYREL) tablet 100 mg  100 mg Oral QHS Patrecia Pour, NP   100 mg at 05/09/14 2138    Lab Results:  Results for orders placed or performed during the hospital encounter of 05/06/14 (from the past 48 hour(s))  Glucose, capillary     Status: Abnormal    Collection Time: 05/08/14  4:15 PM  Result Value Ref Range   Glucose-Capillary 287 (H) 70 - 99 mg/dL  Hemoglobin A1c     Status: Abnormal   Collection Time: 05/08/14  7:35 PM  Result Value Ref Range   Hgb A1c MFr Bld 13.9 (H) 4.8 - 5.6 %    Comment: (NOTE)         Pre-diabetes: 5.7 -  6.4         Diabetes: >6.4         Glycemic control for adults with diabetes: <7.0    Mean Plasma Glucose 352 mg/dL    Comment: (NOTE) Performed At: [facility] [place], [place] [id number] Lindon Romp MD [id number] Performed at [facility]   Glucose, capillary     Status: Abnormal   Collection Time: 05/08/14  9:30 PM  Result Value Ref Range   Glucose-Capillary 109 (H) 70 - 99 mg/dL  Glucose, capillary     Status: None   Collection Time: 05/09/14  6:38 AM  Result Value Ref Range   Glucose-Capillary 85 70 - 99 mg/dL  Glucose, capillary     Status: Abnormal   Collection Time: 05/09/14 11:55 AM  Result Value Ref Range   Glucose-Capillary 174 (H) 70 - 99 mg/dL  Glucose, capillary     Status: Abnormal   Collection Time: 05/09/14 12:51 PM  Result Value Ref Range   Glucose-Capillary 154 (H) 70 - 99 mg/dL  Glucose, capillary     Status: Abnormal   Collection Time: 05/09/14  5:03 PM  Result Value Ref Range   Glucose-Capillary 132 (H) 70 - 99 mg/dL  Glucose, capillary     Status: Abnormal   Collection Time: 05/09/14  9:18 PM  Result Value Ref Range   Glucose-Capillary 111 (H) 70 - 99 mg/dL   Comment 1 Notify RN   Glucose, capillary     Status: Abnormal   Collection Time: 05/10/14  6:00 AM  Result Value Ref Range   Glucose-Capillary 113 (H) 70 - 99 mg/dL   Comment 1 Notify RN   Glucose, capillary     Status: Abnormal   Collection Time: 05/10/14 11:59 AM  Result Value Ref Range   Glucose-Capillary 120 (H) 70 - 99 mg/dL   Comment 1 Document in Chart    Comment 2 Repeat Test    Physical Findings: AIMS:  , ,  ,  ,    CIWA:    COWS:       Assessment- patient improving, less depressed and affect reactive , brighter, not endorsing any SI, tolerating medications well at this time. Will continue medication management.   Treatment Plan Summary: Daily contact with patient to assess and evaluate symptoms and progress in treatment and Medication management  Buspar 10 mgrs BID Continue Zoloft 100 mgrs QDAY  Seroquel 150 mgrs QHS. Change Trazodone to 100 mg po qhs prn for sleep. CSW will work on disposition. Pt also to be referred to [facility] after DC for management of her DM.   Medical Decision Making:  Discuss test with performing physician (1), Decision to obtain old records (1), Review and summation of old records (2), Review of Medication Regimen & Side Effects (2) and Review of New Medication or Change in Dosage (2)  Elicia Lui md 05/10/2014, 2:03 PM

## 2014-05-10 NOTE — B[facility] Group Notes (Signed)
[place] Group Notes:  (Nursing/MHT/Case Management/Adjunct)  Date:  05/10/2014  Time:  0900am  Type of Therapy:  Nurse Education  Participation Level:  Active  Participation Quality:  Appropriate and Attentive  Affect:  Appropriate  Cognitive:  Alert and Appropriate  Insight:  Appropriate and Good  Engagement in Group:  Engaged  Modes of Intervention:  Discussion, Education and Support  Summary of Progress/Problems: Patient attended group, remained engaged, and responded appropriately when prompted. Pt reports recovery is "something you've been battling with trying not to go down the same path."  Jimmye Norman, [place] A 05/10/2014, 9:39 AM

## 2014-05-10 NOTE — Progress Notes (Signed)
D) Pt has been cooperative on approach. Positive fpr groups. Pt has been anxious about boyfriend and wanting to see him. A) Level 3 obs for safety, support and encouragement provided. Med ed reinforced. R) Cooperative.

## 2014-05-11 ENCOUNTER — Encounter (HOSPITAL_COMMUNITY): Payer: Self-pay | Admitting: Registered Nurse

## 2014-05-11 DIAGNOSIS — E1165 Type 2 diabetes mellitus with hyperglycemia: Secondary | ICD-10-CM | POA: Insufficient documentation

## 2014-05-11 LAB — GLUCOSE, CAPILLARY
GLUCOSE-CAPILLARY: 92 mg/dL (ref 70–99)
GLUCOSE-CAPILLARY: 186 mg/dL — AB (ref 70–99)

## 2014-05-11 MED ORDER — GLYBURIDE 5 MG PO TABS: 5.0000 mg | ORAL_TABLET | Freq: Every day | ORAL | Status: DC

## 2014-05-11 MED ORDER — QUETIAPINE FUMARATE 50 MG PO TABS: 150.0000 mg | ORAL_TABLET | Freq: Every day | ORAL | Status: DC

## 2014-05-11 MED ORDER — SERTRALINE HCL 100 MG PO TABS: 100.0000 mg | ORAL_TABLET | Freq: Every day | ORAL | Status: DC

## 2014-05-11 MED ORDER — QUETIAPINE FUMARATE 50 MG PO TABS
150.0000 mg | ORAL_TABLET | Freq: Every day | ORAL | Status: DC
  Filled 2014-05-11: qty 42

## 2014-05-11 MED ORDER — LISINOPRIL 5 MG PO TABS: 5.0000 mg | ORAL_TABLET | Freq: Every day | ORAL | Status: DC

## 2014-05-11 MED ORDER — BUSPIRONE HCL 10 MG PO TABS: 10.0000 mg | ORAL_TABLET | Freq: Two times a day (BID) | ORAL | Status: DC

## 2014-05-11 NOTE — Progress Notes (Signed)
  [facility] Adult Case Management Discharge Plan :  Will you be returning to the same living situation after discharge:  Yes,  home At discharge, do you have transportation home?: Yes,  bus pass Do you have the ability to pay for your medications: Yes,  mental health  Release of information consent forms completed and in the chart;  Patient's signature needed at discharge.  Patient to Follow up at: Follow-up Information    Follow up with [facility].   Why:  Go to the walk-in clinic M-F between 8 and 10AM for your hospital follow up appointment   Contact information:   [name] [phone]      Follow up with [facility] On 05/18/2014.   Why:  Wednesday at 11:00 for your noon appointment with the Dr.  This is for your blood sugar.   Contact information:   [place] [phone]      Patient denies SI/HI: Yes,  yes    Land and Suicide Prevention discussed: Yes,  yes  Have you used any form of tobacco in the last 30 days? (Cigarettes, Smokeless Tobacco, Cigars, and/or Pipes): No  Has patient been referred to the Quitline?: N/A patient is not a smoker  Lorraine Turner, Lorraine Turner B 05/11/2014, 10:50 AM

## 2014-05-11 NOTE — Tx Team (Signed)
  Interdisciplinary Treatment Plan Update   Date Reviewed:  05/11/2014  Time Reviewed:  10:38 AM  Progress in Treatment:   Attending groups: Yes Participating in groups: Yes Taking medication as prescribed: Yes  Tolerating medication: Yes Family/Significant other contact made: Yes  Patient understands diagnosis: Yes  Discussing patient identified problems/goals with staff: Yes  See initial care plan Medical problems stabilized or resolved: Yes Denies suicidal/homicidal ideation: Yes  In tx team Patient has not harmed self or others: Yes  For review of initial/current patient goals, please see plan of care.  Estimated Length of Stay:  D/C today  Reason for Continuation of Hospitalization:   New Problems/Goals identified:  N/A  Discharge Plan or Barriers:  return home, follow up outpt   Additional Comments:  Attendees:  Signature: Steva Colder, MD 05/11/2014 10:38 AM   Signature: Ripley Fraise, LCSW 05/11/2014 10:38 AM  Signature:  05/11/2014 10:38 AM  Signature: Erasmo Leventhal, RN 05/11/2014 10:38 AM  Signature:  05/11/2014 10:38 AM  Signature:  05/11/2014 10:38 AM  Signature:   05/11/2014 10:38 AM  Signature:    Signature:    Signature:    Signature:    Signature:    Signature:      Scribe for Treatment Team:   Ripley Fraise, LCSW  05/11/2014 10:38 AM
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
## 2014-05-11 NOTE — BHH Suicide Risk Assessment (Signed)
[place] INPATIENT:  Family/Significant Other Suicide Prevention Education  Suicide Prevention Education:  Patient Refusal for Family/Significant Other Suicide Prevention Education: The patient Lorraine Turner has refused to provide written consent for family/significant other to be provided Family/Significant Other Suicide Prevention Education during admission and/or prior to discharge.  Physician notified.  Roque Lias B 05/11/2014, 10:49 AM

## 2014-05-11 NOTE — Progress Notes (Signed)
D: Pt denies SI/HI/AVH. Pt is pleasant and cooperative. Pt observed happy on unit and in good spirits. "I believe I can do it this time"  A: Pt was offered support and encouragement. Pt was given scheduled medications. Pt was encourage to attend groups. Q 15 minute checks were done for safety.   R:Pt attends groups and interacts well with peers and staff. Pt is taking medication. Pt has no complaints at this time .Pt receptive to treatment and safety maintained on unit.

## 2014-05-11 NOTE — BHH Suicide Risk Assessment (Signed)
[facility] Discharge Suicide Risk Assessment   Demographic Factors:  Low socioeconomic status  Total Time spent with patient: 30 minutes  Musculoskeletal: Strength & Muscle Tone: within normal limits Gait & Station: normal Patient leans: N/A  Psychiatric Specialty Exam: Physical Exam  Review of Systems  Psychiatric/Behavioral: Negative for depression, suicidal ideas, hallucinations and substance abuse. The patient is not nervous/anxious and does not have insomnia.     Blood pressure 106/57, pulse 66, temperature 98.3 F (36.8 C), temperature source Oral, resp. rate 16, height 5\' 2"  (1.575 m), weight 68.493 kg (151 lb), SpO2 98 %.Body mass index is 27.61 kg/(m^2).  General Appearance: Casual  Eye Contact::  Good  Speech:  Clear and [id number]  Volume:  Normal  Mood:  Euthymic  Affect:  Congruent  Thought Process:  Coherent  Orientation:  Full (Time, Place, and Person)  Thought Content:  WDL  Suicidal Thoughts:  No  Homicidal Thoughts:  No  Memory:  Immediate;   Fair Recent;   Fair Remote;   Fair  Judgement:  Fair  Insight:  Fair  Psychomotor Activity:  Normal  Concentration:  Fair  Recall:  [employer] of Knowledge:Fair  Language: Fair  Akathisia:  No  Handed:  Right  AIMS (if indicated):     Assets:  Communication Skills Desire for Improvement  Sleep:  Number of Hours: 6.75  Cognition: WNL  ADL's:  Intact   Have you used any form of tobacco in the last 30 days? (Cigarettes, Smokeless Tobacco, Cigars, and/or Pipes): No  Has this patient used any form of tobacco in the last 30 days? (Cigarettes, Smokeless Tobacco, Cigars, and/or Pipes) No  Mental Status Per Nursing Assessment::   On Admission:     Current Mental Status by Physician: patient denies SI/HI/AH/VH  Loss Factors: Financial problems/change in socioeconomic status  Historical Factors: Impulsivity  Risk Reduction Factors:   Positive social support and Positive coping skills or problem solving  skills  Continued Clinical Symptoms:  Previous Psychiatric Diagnoses and Treatments Medical Diagnoses and Treatments/Surgeries  Cognitive Features That Contribute To Risk:  Polarized thinking    Suicide Risk:  Minimal: No identifiable suicidal ideation.  Patients presenting with no risk factors but with morbid ruminations; may be classified as minimal risk based on the severity of the depressive symptoms  Principal Problem: Schizoaffective disorder, depressive type Discharge Diagnoses:  Patient Active Problem List   Diagnosis Date Noted  . Diabetes mellitus [E11.9] 05/10/2014  . Schizoaffective disorder, depressive type [F25.1] 08/14/2011  . UNSPECIFIED ANEMIA [D64.9] 01/23/2009  . HYPERTENSION [I10] 10/12/2008  . HYPERCHOLESTEROLEMIA [E78.0] 01/01/2007  . PANIC DISORDER [F41.0] 01/01/2007  . 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   Why:  Go to the walk-in clinic M-F between 8 and 10AM for your hospital follow up appointment   Contact information:   [place] [phone]      Follow up with [facility].      Plan Of Care/Follow-up recommendations:  Activity:No restrictions Diet: Carb Modified Tests:Follow up with PCP for Diabetic management. Hba1c needs to be followed Other: Follow up with after care as scheduled.  Is patient on multiple antipsychotic therapies at discharge:  No   Has Patient had three or more failed trials of antipsychotic monotherapy by history:  No  Recommended Plan for Multiple Antipsychotic Therapies: NA    Naithan Delage md 05/11/2014, 9:12 AM

## 2014-05-16 NOTE — Progress Notes (Signed)
Patient Discharge Instructions:  After Visit Summary (AVS):   Faxed to:  05/16/14 Discharge Summary Note:   Faxed to:  05/16/14 Psychiatric Admission Assessment Note:   Faxed to:  05/16/14 Suicide Risk Assessment - Discharge Assessment:   Faxed to:  05/16/14 Faxed/Sent to the Next Level Care provider:  05/16/14 Next Level Care Provider Has Access to the EMR, 05/16/14  Faxed to [facility] @ [phone] Records provided to [facility] via [facility]/Epic access.   Patsey Berthold, 05/16/2014, 2:03 PM

## 2014-05-18 ENCOUNTER — Ambulatory Visit: Payer: Self-pay | Admitting: Family Medicine

## 2014-09-20 ENCOUNTER — Encounter (HOSPITAL_COMMUNITY): Payer: Self-pay | Admitting: *Deleted

## 2014-09-20 ENCOUNTER — Emergency Department (HOSPITAL_COMMUNITY)
Admission: EM | Admit: 2014-09-20 | Discharge: 2014-09-21 | Disposition: A | Discharge status: Home / Self Care | Payer: Self-pay | Attending: Emergency Medicine | Admitting: Emergency Medicine

## 2014-09-20 ENCOUNTER — Emergency Department (HOSPITAL_COMMUNITY): Payer: Self-pay

## 2014-09-20 ENCOUNTER — Emergency Department (HOSPITAL_COMMUNITY): Payer: Medicaid Other

## 2014-09-20 DIAGNOSIS — E1165 Type 2 diabetes mellitus with hyperglycemia: Secondary | ICD-10-CM | POA: Insufficient documentation

## 2014-09-20 DIAGNOSIS — F319 Bipolar disorder, unspecified: Secondary | ICD-10-CM | POA: Insufficient documentation

## 2014-09-20 DIAGNOSIS — Z79899 Other long term (current) drug therapy: Secondary | ICD-10-CM | POA: Insufficient documentation

## 2014-09-20 DIAGNOSIS — Z87891 Personal history of nicotine dependence: Secondary | ICD-10-CM | POA: Insufficient documentation

## 2014-09-20 DIAGNOSIS — F419 Anxiety disorder, unspecified: Secondary | ICD-10-CM | POA: Insufficient documentation

## 2014-09-20 DIAGNOSIS — I10 Essential (primary) hypertension: Secondary | ICD-10-CM | POA: Insufficient documentation

## 2014-09-20 DIAGNOSIS — F209 Schizophrenia, unspecified: Secondary | ICD-10-CM | POA: Insufficient documentation

## 2014-09-20 DIAGNOSIS — R079 Chest pain, unspecified: Secondary | ICD-10-CM | POA: Insufficient documentation

## 2014-09-20 LAB — URINALYSIS, ROUTINE W REFLEX MICROSCOPIC
Bilirubin Urine: NEGATIVE
HGB URINE DIPSTICK: NEGATIVE
KETONES UR: 40 mg/dL — AB
LEUKOCYTES UA: NEGATIVE
Nitrite: NEGATIVE
PROTEIN: NEGATIVE mg/dL
SPECIFIC GRAVITY, URINE: 1.042 — AB (ref 1.005–1.030)
UROBILINOGEN UA: 1 mg/dL (ref 0.0–1.0)
pH: 6 (ref 5.0–8.0)

## 2014-09-20 LAB — CBC
HEMATOCRIT: 38.1 % (ref 36.0–46.0)
HEMOGLOBIN: 12.9 g/dL (ref 12.0–15.0)
MCH: 29.6 pg (ref 26.0–34.0)
MCHC: 33.9 g/dL (ref 30.0–36.0)
MCV: 87.4 fL (ref 78.0–100.0)
PLATELETS: 278 10*3/uL (ref 150–400)
RBC: 4.36 MIL/uL (ref 3.87–5.11)
RDW: 12.9 % (ref 11.5–15.5)
WBC: 6.3 10*3/uL (ref 4.0–10.5)

## 2014-09-20 LAB — BASIC METABOLIC PANEL
Anion gap: 11 (ref 5–15)
BUN: 9 mg/dL (ref 6–20)
CALCIUM: 9.4 mg/dL (ref 8.9–10.3)
CO2: 24 mmol/L (ref 22–32)
Chloride: 96 mmol/L — ABNORMAL LOW (ref 101–111)
Creatinine, Ser: 0.77 mg/dL (ref 0.44–1.00)
GFR calc Af Amer: >60 mL/min (ref >60)
GFR calc non Af Amer: >60 mL/min (ref >60)
GLUCOSE: 485 mg/dL — AB (ref 65–99)
Potassium: 4.3 mmol/L (ref 3.5–5.1)
SODIUM: 131 mmol/L — AB (ref 135–145)

## 2014-09-20 LAB — URINE MICROSCOPIC-ADD ON

## 2014-09-20 LAB — CBG MONITORING, ED
GLUCOSE-CAPILLARY: 330 mg/dL — AB (ref 65–99)
Glucose-Capillary: 474 mg/dL — ABNORMAL HIGH (ref 65–99)

## 2014-09-20 LAB — I-STAT TROPONIN, ED: TROPONIN I, POC: 0 ng/mL (ref 0.00–0.08)

## 2014-09-20 MED ORDER — SITAGLIPTIN PHOS-METFORMIN HCL 50-1000 MG PO TABS: 1.0000 | ORAL_TABLET | Freq: Two times a day (BID) | ORAL | Status: DC

## 2014-09-20 MED ORDER — METFORMIN HCL 500 MG PO TABS
1000.0000 mg | ORAL_TABLET | Freq: Once | ORAL | Status: DC
Start: 2014-09-20 — End: 2014-09-21

## 2014-09-20 MED ORDER — SODIUM CHLORIDE 0.9 % IV BOLUS (SEPSIS)
1000.0000 mL | Freq: Once | INTRAVENOUS | Status: AC
  Administered 2014-09-20: 1000 mL via INTRAVENOUS

## 2014-09-20 MED ORDER — SODIUM CHLORIDE 0.9 % IV BOLUS (SEPSIS): 2000.0000 mL | Freq: Once | INTRAVENOUS | Status: DC

## 2014-09-20 NOTE — Discharge Instructions (Signed)
High Blood Sugar °High blood sugar (hyperglycemia) means that the level of sugar in your blood is higher than it should be. Signs of high blood sugar include: °· Feeling thirsty. °· Frequent peeing (urinating). °· Feeling tired or sleepy. °· Dry mouth. °· Vision changes. °· Feeling weak. °· Feeling hungry but losing weight. °· Numbness and tingling in your hands or feet. °· Headache. °When you ignore these signs, your blood sugar may keep going up. These problems may get worse, and other problems may begin. °HOME CARE °· Check your blood sugars as told by your doctor. Write down the numbers with the date and time. °· Take the right amount of insulin or diabetes pills at the right time. Write down the dose with date and time. °· Refill your insulin or diabetes pills before running out. °· Watch what you eat. Follow your meal plan. °· Drink liquids without sugar, such as water. Check with your doctor if you have kidney or heart disease. °· Follow your doctor's orders for exercise. Exercise at the same time of day. °· Keep your doctor's appointments. °GET HELP RIGHT AWAY IF:  °· You have trouble thinking or are confused. °· You have fast breathing with fruity smelling breath. °· You pass out (faint). °· You have 2 to 3 days of high blood sugars and you do not know why. °· You have chest pain. °· You are feeling sick to your stomach (nauseous) or throwing up (vomiting). °· You have sudden vision changes. °MAKE SURE YOU:  °· Understand these instructions. °· Will watch your condition. °· Will get help right away if you are not doing well or get worse. °Document Released: 11/25/2008 Document Revised: 04/22/2011 Document Reviewed: 11/25/2008 °[employer]® Patient Information ©2015 [employer]. This information is not intended to replace advice given to you by your health care provider. Make sure you discuss any questions you have with your health care provider. ° °

## 2014-09-20 NOTE — ED Provider Notes (Signed)
CSN: [id number]     Arrival date & time 09/20/14  1742 History  This chart was scribed for Everlene Balls, MD by Rayna Sexton, ED scribe. This patient was seen in room A08C/A08C and the patient's care was started at 11:04 PM.  Chief Complaint  Patient presents with  . Chest Pain  . Hyperglycemia  . Dizziness   The history is provided by the patient. No language interpreter was used.    HPI Comments: Lorraine Turner is a 54 y.o. female who presents to the Emergency Department due to intermittent, mild, CP with onset in the past few days.  Pt notes that she has not been taking her prescribed medications including janumet for nearly 3 months and further notes that her DM has been giving her issues recently.  She is currently changing PCP.  Pt notes confusion, dizziness, palpitations, rapid weight loss, frequent urination and SOB. She notes that stress exacerbates her feelings of chest pain and palpitations. Pt denies any other associated symptoms.    Past Medical History  Diagnosis Date  . Hypertension   . Diabetes mellitus   . Bipolar 1 disorder   . Schizophrenia   . Anxiety   . Depression    History reviewed. No pertinent past surgical history. No family history on file. History  Substance Use Topics  . Smoking status: Former Smoker    Types: Cigars    Quit date: 03/14/2013  . Smokeless tobacco: Not on file  . Alcohol Use: No     Comment: every weekend   OB History    No data available     Review of SystemsA complete 10 system review of systems was obtained and all systems are negative except as noted in the HPI and PMH.   Allergies  Review of patient's allergies indicates no known allergies.  Home Medications   Prior to Admission medications   Medication Sig Start Date End Date Taking? Authorizing Provider  busPIRone (BUSPAR) 10 MG tablet Take 1 tablet (10 mg total) by mouth 2 (two) times daily. For anxiety Patient not taking: Reported on 09/20/2014 05/11/14   Shuvon B  Rankin, NP  cholecalciferol (VITAMIN D) 1000 UNITS tablet Take 1 tablet (1,000 Units total) by mouth 2 (two) times daily. Patient not taking: Reported on 09/20/2014 09/03/13   Niel Hummer, NP  glyBURIDE (DIABETA) 5 MG tablet Take 1 tablet (5 mg total) by mouth daily with breakfast. For Type 2 diabetes Patient not taking: Reported on 09/20/2014 05/11/14   Shuvon B Rankin, NP  lisinopril (PRINIVIL,ZESTRIL) 5 MG tablet Take 1 tablet (5 mg total) by mouth daily. For high blood pressure Patient not taking: Reported on 09/20/2014 05/11/14   Shuvon B Rankin, NP  QUEtiapine (SEROQUEL) 50 MG tablet Take 3 tablets (150 mg total) by mouth at bedtime. For mood control/depression Patient not taking: Reported on 09/20/2014 05/11/14   Shuvon B Rankin, NP  sertraline (ZOLOFT) 100 MG tablet Take 1 tablet (100 mg total) by mouth daily. For depression Patient not taking: Reported on 09/20/2014 05/11/14   Shuvon B Rankin, NP  simvastatin (ZOCOR) 20 MG tablet Take 1 tablet (20 mg total) by mouth daily at 6 PM. Patient not taking: Reported on 05/05/2014 09/03/13   Niel Hummer, NP  sitaGLIPtin-metformin (JANUMET) 50-1000 MG per tablet Take 1 tablet by mouth 2 (two) times daily with a meal. Patient not taking: Reported on 09/20/2014 09/03/13   Niel Hummer, NP  traZODone (DESYREL) 100 MG tablet Take 1 tablet (  100 mg total) by mouth at bedtime. Patient not taking: Reported on 09/20/2014 09/03/13   Niel Hummer, NP   BP 170/74 mmHg  Pulse 60  Temp(Src) 98.3 F (36.8 C) (Oral)  Resp 16  SpO2 100% Physical Exam  Constitutional: She is oriented to person, place, and time. She appears well-developed and well-nourished. No distress.  HENT:  Head: Normocephalic and atraumatic.  Nose: Nose normal.  Mouth/Throat: Oropharynx is clear and moist. No oropharyngeal exudate.  Eyes: Conjunctivae and EOM are normal. Pupils are equal, round, and reactive to light. No scleral icterus.  Neck: Normal range of motion. Neck supple. No JVD present.  No tracheal deviation present. No thyromegaly present.  Cardiovascular: Normal rate, regular rhythm and normal heart sounds.  Exam reveals no gallop and no friction rub.   No murmur heard. Pulmonary/Chest: Effort normal and breath sounds normal. No respiratory distress. She has no wheezes. She exhibits no tenderness.  Abdominal: Soft. Bowel sounds are normal. She exhibits no distension and no mass. There is no tenderness. There is no rebound and no guarding.  Musculoskeletal: Normal range of motion. She exhibits no edema or tenderness.  Lymphadenopathy:    She has no cervical adenopathy.  Neurological: She is alert and oriented to person, place, and time. No cranial nerve deficit. She exhibits normal muscle tone.  Skin: Skin is warm and dry. No rash noted. No erythema. No pallor.  Nursing note and vitals reviewed.   ED Course  Procedures  DIAGNOSTIC STUDIES: Oxygen Saturation is 100% on RA, normal by my interpretation.    COORDINATION OF CARE: 11:09 PM Discussed treatment plan with pt at bedside and pt agreed to plan.  Labs Review Labs Reviewed  BASIC METABOLIC PANEL - Abnormal; Notable for the following:    Sodium 131 (*)    Chloride 96 (*)    Glucose, Bld 485 (*)    All other components within normal limits  URINALYSIS, ROUTINE W REFLEX MICROSCOPIC (NOT AT [facility]) - Abnormal; Notable for the following:    Specific Gravity, Urine 1.042 (*)    Glucose, UA >1000 (*)    Ketones, ur 40 (*)    All other components within normal limits  URINE MICROSCOPIC-ADD ON - Abnormal; Notable for the following:    Squamous Epithelial / LPF FEW (*)    Bacteria, UA FEW (*)    All other components within normal limits  CBG MONITORING, ED - Abnormal; Notable for the following:    Glucose-Capillary 474 (*)    All other components within normal limits  CBG MONITORING, ED - Abnormal; Notable for the following:    Glucose-Capillary 330 (*)    All other components within normal limits  CBG MONITORING,  ED - Abnormal; Notable for the following:    Glucose-Capillary 224 (*)    All other components within normal limits  CBC  I-STAT TROPOININ, ED    Imaging Review Dg Chest 2 View  09/20/2014   CLINICAL DATA:  Shortness of breath and chest pain  EXAM: CHEST  2 VIEW  COMPARISON:  January 15, 2011  FINDINGS: There is a 7 mm nodular opacity in the left upper lobe region. Lungs elsewhere clear. Heart size and pulmonary vascularity are normal. No adenopathy. No pneumothorax. No bone lesions.  IMPRESSION: 7 mm nodular opacity left upper lobe, not present on prior study. This finding warrants noncontrast enhanced chest CT to further evaluate.  Lungs elsewhere clear.  No edema or consolidation.   Electronically Signed   By: Gwyndolyn Saxon  Jasmine December III M.D.   On: 09/20/2014 19:14   Ct Chest Wo Contrast  09/21/2014   CLINICAL DATA:  Pulmonary nodule  EXAM: CT CHEST WITHOUT CONTRAST  TECHNIQUE: Multidetector CT imaging of the chest was performed following the standard protocol without IV contrast.  COMPARISON:  Chest radiograph September 20, 2014  FINDINGS: There is no edema or consolidation. No parenchymal lung nodular lesion is seen by CT.  Thyroid appears normal. No thoracic adenopathy. No thoracic aortic aneurysm. The pericardium is not thickened.  There is a small hiatal hernia.  Visualized upper abdominal structures appear normal.  There is degenerative change in the thoracic spine. There are no blastic or lytic bone lesions.  IMPRESSION: No pulmonary nodular lesions by CT. Lungs are clear. No adenopathy. Small hiatal hernia.   Electronically Signed   By: Lowella Grip III M.D.   On: 09/21/2014 00:58     EKG Interpretation   Date/Time:  Tuesday September 20 2014 17:55:20 EDT Ventricular Rate:  75 PR Interval:  106 QRS Duration: 90 QT Interval:  408 QTC Calculation: 455 R Axis:   79 Text Interpretation:  Sinus rhythm with short PR Otherwise normal ECG No  significant change since last tracing Confirmed by  Glynn Octave  [phone]) on 09/20/2014 11:21:01 PM      MDM   Final diagnoses:  None   Patient presents to the ED for hyperglycemia.  FS is 330.  She was ordered 1L IVF and her home metformin medication.  CXR ordered by triage indicates a L sided lung nodule.  Will order CT scan as this is a new nodule in the setting of palpitations.  She denies any chest pain currently in the room.  Will provide Rx for Janumet for her to fill and also refer her to PCP to be seen within 3 days.  There is no DKA on lab studies.  UA negative for UTI.  CT negative for any pulmonary nodule. Blood sugar is down to the 200s. Prescriptions was refilled. She is advised to follow-up with her primary care. She appears well in no acute distress. Vital signs remain within her normal limits and she is safe for discharge.  I personally performed the services described in this documentation, which was scribed in my presence. The recorded information has been reviewed and is accurate.    Everlene Balls, MD 09/21/14 [phone]

## 2014-09-20 NOTE — ED Notes (Signed)
PT states she has been drinking etoh for stress, denies SI

## 2014-09-20 NOTE — ED Notes (Signed)
Pt is here with elevated blood sugar and states has not been checking.  Pt states she knows it is high because she is getting over yeast infection, weight loss, increased urination.  Pt states chest pain that is intermittent and reports some shortness of breath.  Reports dizziness

## 2014-09-21 LAB — CBG MONITORING, ED: Glucose-Capillary: 224 mg/dL — ABNORMAL HIGH (ref 65–99)

## 2015-05-31 ENCOUNTER — Encounter (HOSPITAL_COMMUNITY): Payer: Self-pay | Admitting: *Deleted

## 2015-05-31 ENCOUNTER — Emergency Department (HOSPITAL_COMMUNITY)
Admission: EM | Admit: 2015-05-31 | Discharge: 2015-06-01 | Disposition: A | Discharge status: Home / Self Care | Payer: Medicaid Other | Attending: Emergency Medicine | Admitting: Emergency Medicine

## 2015-05-31 DIAGNOSIS — R739 Hyperglycemia, unspecified: Secondary | ICD-10-CM

## 2015-05-31 DIAGNOSIS — B373 Candidiasis of vulva and vagina: Secondary | ICD-10-CM | POA: Insufficient documentation

## 2015-05-31 DIAGNOSIS — B3731 Acute candidiasis of vulva and vagina: Secondary | ICD-10-CM

## 2015-05-31 DIAGNOSIS — F419 Anxiety disorder, unspecified: Secondary | ICD-10-CM | POA: Insufficient documentation

## 2015-05-31 DIAGNOSIS — Z79899 Other long term (current) drug therapy: Secondary | ICD-10-CM | POA: Insufficient documentation

## 2015-05-31 DIAGNOSIS — E1165 Type 2 diabetes mellitus with hyperglycemia: Secondary | ICD-10-CM | POA: Insufficient documentation

## 2015-05-31 DIAGNOSIS — Z7984 Long term (current) use of oral hypoglycemic drugs: Secondary | ICD-10-CM | POA: Insufficient documentation

## 2015-05-31 DIAGNOSIS — Z87891 Personal history of nicotine dependence: Secondary | ICD-10-CM | POA: Insufficient documentation

## 2015-05-31 DIAGNOSIS — B9689 Other specified bacterial agents as the cause of diseases classified elsewhere: Secondary | ICD-10-CM

## 2015-05-31 DIAGNOSIS — F319 Bipolar disorder, unspecified: Secondary | ICD-10-CM | POA: Insufficient documentation

## 2015-05-31 DIAGNOSIS — N760 Acute vaginitis: Secondary | ICD-10-CM | POA: Insufficient documentation

## 2015-05-31 DIAGNOSIS — I10 Essential (primary) hypertension: Secondary | ICD-10-CM | POA: Insufficient documentation

## 2015-05-31 LAB — URINALYSIS, ROUTINE W REFLEX MICROSCOPIC
Bilirubin Urine: NEGATIVE
Glucose, UA: >1000 mg/dL — AB
Hgb urine dipstick: NEGATIVE
Ketones, ur: 15 mg/dL — AB
LEUKOCYTES UA: NEGATIVE
NITRITE: POSITIVE — AB
PROTEIN: NEGATIVE mg/dL
SPECIFIC GRAVITY, URINE: 1.046 — AB (ref 1.005–1.030)
pH: 5 (ref 5.0–8.0)

## 2015-05-31 LAB — COMPREHENSIVE METABOLIC PANEL
ALK PHOS: 84 U/L (ref 38–126)
ALT: 17 U/L (ref 14–54)
ANION GAP: 13 (ref 5–15)
AST: 14 U/L — ABNORMAL LOW (ref 15–41)
Albumin: 4.1 g/dL (ref 3.5–5.0)
BUN: 8 mg/dL (ref 6–20)
CALCIUM: 9.6 mg/dL (ref 8.9–10.3)
CO2: 26 mmol/L (ref 22–32)
CREATININE: 0.77 mg/dL (ref 0.44–1.00)
Chloride: 96 mmol/L — ABNORMAL LOW (ref 101–111)
Glucose, Bld: 367 mg/dL — ABNORMAL HIGH (ref 65–99)
Potassium: 3.8 mmol/L (ref 3.5–5.1)
Sodium: 135 mmol/L (ref 135–145)
TOTAL PROTEIN: 8 g/dL (ref 6.5–8.1)
Total Bilirubin: 1 mg/dL (ref 0.3–1.2)

## 2015-05-31 LAB — CBC
HCT: 39.5 % (ref 36.0–46.0)
HEMOGLOBIN: 13.6 g/dL (ref 12.0–15.0)
MCH: 29.7 pg (ref 26.0–34.0)
MCHC: 34.4 g/dL (ref 30.0–36.0)
MCV: 86.2 fL (ref 78.0–100.0)
PLATELETS: 297 10*3/uL (ref 150–400)
RBC: 4.58 MIL/uL (ref 3.87–5.11)
RDW: 12.1 % (ref 11.5–15.5)
WBC: 6 10*3/uL (ref 4.0–10.5)

## 2015-05-31 LAB — URINE MICROSCOPIC-ADD ON: RBC / HPF: NONE SEEN RBC/hpf (ref 0–5)

## 2015-05-31 LAB — WET PREP, GENITAL
Sperm: NONE SEEN
TRICH WET PREP: NONE SEEN

## 2015-05-31 LAB — LIPASE, BLOOD: Lipase: 17 U/L (ref 11–51)

## 2015-05-31 LAB — TSH: TSH: 1.667 u[IU]/mL (ref 0.350–4.500)

## 2015-05-31 MED ORDER — SODIUM CHLORIDE 0.9 % IV BOLUS (SEPSIS)
1000.0000 mL | Freq: Once | INTRAVENOUS | Status: AC
Start: 2015-05-31 — End: 2015-06-01
  Administered 2015-05-31: 1000 mL via INTRAVENOUS

## 2015-05-31 NOTE — ED Notes (Signed)
Dr Miller at bedside. 

## 2015-05-31 NOTE — ED Notes (Signed)
Also c/o tooth pain  And she has had a recent vaginal infection

## 2015-05-31 NOTE — ED Provider Notes (Signed)
CSN: [id number]     Arrival date & time 05/31/15  1729 History   First MD Initiated Contact with Patient 05/31/15 2042     Chief Complaint  Patient presents with  . multiple complaints      (Consider location/radiation/quality/duration/timing/severity/associated sxs/prior Treatment) HPI Comments: The patient is a 55 year old female, she has multiple complaints this evening. She reports that she has a history of diabetes, bipolar disorder and schizophrenia. She also has hypertension. She is on multiple medications and reports that she does take these medications as prescribed however over the last several months she has had a weight loss of approximately 20 pounds which she relates to having a poor appetite and having uncontrolled blood sugar. She has been seen at the [facility].  they are following her very closely, she has been seen within the last month according to the patient, she states that she has no chest pain or shortness of breath, no coughing, no abdominal pain but she does feel generally weak and fatigued, she reports a small amount of vaginal discharge and occasional dysuria, she complains of chronic diffuse dental pain, she denies any rashes, tick bites, spider bites, seizures, syncope or other complaints.  The history is provided by the patient.    Past Medical History  Diagnosis Date  . Hypertension   . Diabetes mellitus   . Bipolar 1 disorder ([place])   . Schizophrenia ([place])   . Anxiety   . Depression    History reviewed. No pertinent past surgical history. No family history on file. Social History  Substance Use Topics  . Smoking status: Former Smoker    Types: Cigars    Quit date: 03/14/2013  . Smokeless tobacco: None  . Alcohol Use: No     Comment: every weekend   OB History    No data available     Review of Systems  All other systems reviewed and are negative.     Allergies  Metformin and related  Home Medications   Prior to Admission medications    Medication Sig Start Date End Date Taking? Authorizing Provider  glyBURIDE (DIABETA) 5 MG tablet Take 1 tablet (5 mg total) by mouth daily with breakfast. For Type 2 diabetes 05/11/14  Yes Shuvon B Rankin, NP  lisinopril (PRINIVIL,ZESTRIL) 20 MG tablet Take 20 mg by mouth daily.   Yes Historical Provider, MD  metFORMIN (GLUCOPHAGE) 500 MG tablet Take 500 mg by mouth 2 (two) times daily with a meal.   Yes Historical Provider, MD  traZODone (DESYREL) 100 MG tablet Take 1 tablet (100 mg total) by mouth at bedtime. Patient taking differently: Take 50 mg by mouth at bedtime.  09/03/13  Yes Niel Hummer, NP  busPIRone (BUSPAR) 15 MG tablet Take 7.5 mg by mouth 2 (two) times daily. Reported on 05/31/2015    Historical Provider, MD  cholecalciferol (VITAMIN D) 1000 UNITS tablet Take 1 tablet (1,000 Units total) by mouth 2 (two) times daily. Patient not taking: Reported on 09/20/2014 09/03/13   Niel Hummer, NP  lisinopril (PRINIVIL,ZESTRIL) 5 MG tablet Take 1 tablet (5 mg total) by mouth daily. For high blood pressure Patient not taking: Reported on 09/20/2014 05/11/14   Shuvon B Rankin, NP  metroNIDAZOLE (FLAGYL) 500 MG tablet Take 1 tablet (500 mg total) by mouth 2 (two) times daily. 06/01/15   Noemi Chapel, MD  QUEtiapine (SEROQUEL) 50 MG tablet Take 3 tablets (150 mg total) by mouth at bedtime. For mood control/depression Patient not taking: Reported on  05/31/2015 05/11/14   Shuvon B Rankin, NP  sertraline (ZOLOFT) 100 MG tablet Take 1 tablet (100 mg total) by mouth daily. For depression Patient not taking: Reported on 09/20/2014 05/11/14   Shuvon B Rankin, NP  simvastatin (ZOCOR) 20 MG tablet Take 1 tablet (20 mg total) by mouth daily at 6 PM. Patient not taking: Reported on 05/31/2015 09/03/13   Niel Hummer, NP  sitaGLIPtin-metformin (JANUMET) 50-1000 MG per tablet Take 1 tablet by mouth 2 (two) times daily with a meal. 09/20/14   Everlene Balls, MD   BP 159/79 mmHg  Pulse 72  Temp(Src) 98.7 F (37.1 C)  Resp  18  Ht 5\' 2"  (1.575 m)  Wt 127 lb 1 oz (57.635 kg)  BMI 23.23 kg/m2  SpO2 100% Physical Exam  Constitutional: She appears well-developed and well-nourished. No distress.  HENT:  Head: Normocephalic and atraumatic.  Mouth/Throat: Oropharynx is clear and moist. No oropharyngeal exudate.  Eyes: Conjunctivae and EOM are normal. Pupils are equal, round, and reactive to light. Right eye exhibits no discharge. Left eye exhibits no discharge. No scleral icterus.  Neck: Normal range of motion. Neck supple. No JVD present. No thyromegaly present.  Cardiovascular: Normal rate, regular rhythm, normal heart sounds and intact distal pulses.  Exam reveals no gallop and no friction rub.   No murmur heard. Pulmonary/Chest: Effort normal and breath sounds normal. No respiratory distress. She has no wheezes. She has no rales.  Abdominal: Soft. Bowel sounds are normal. She exhibits no distension and no mass. There is no tenderness.  Genitourinary:  Chaperone present for exam Has normal appearing external genitalia Normal appearing cervix No FB in vagina Scant d/c No foul odor No ttp and no bleeding.  Musculoskeletal: Normal range of motion. She exhibits no edema or tenderness.  Lymphadenopathy:    She has no cervical adenopathy.  Neurological: She is alert. Coordination normal.  Skin: Skin is warm and dry. No rash noted. No erythema.  Psychiatric: She has a normal mood and affect. Her behavior is normal.  Nursing note and vitals reviewed.   ED Course  Procedures (including critical care time) Labs Review Labs Reviewed  WET PREP, GENITAL - Abnormal; Notable for the following:    Yeast Wet Prep HPF POC PRESENT (*)    Clue Cells Wet Prep HPF POC PRESENT (*)    WBC, Wet Prep HPF POC MANY (*)    All other components within normal limits  COMPREHENSIVE METABOLIC PANEL - Abnormal; Notable for the following:    Chloride 96 (*)    Glucose, Bld 367 (*)    AST 14 (*)    All other components within  normal limits  URINALYSIS, ROUTINE W REFLEX MICROSCOPIC (NOT AT [facility]) - Abnormal; Notable for the following:    APPearance HAZY (*)    Specific Gravity, Urine 1.046 (*)    Glucose, UA >1000 (*)    Ketones, ur 15 (*)    Nitrite POSITIVE (*)    All other components within normal limits  URINE MICROSCOPIC-ADD ON - Abnormal; Notable for the following:    Squamous Epithelial / LPF 0-5 (*)    Bacteria, UA FEW (*)    All other components within normal limits  LIPASE, BLOOD  CBC  TSH  RPR  HIV ANTIBODY (ROUTINE TESTING)  CBG MONITORING, ED  GC/CHLAMYDIA PROBE AMP ([place]) NOT AT [facility]    Imaging Review No results found. I have personally reviewed and evaluated these images and lab results as part of  my medical decision-making.   MDM   Final diagnoses:  Yeast vaginitis  BV (bacterial vaginosis)  Hyperglycemia    The patient has several abnormal lab values including having some ketones and hyperglycemia however she has a normal CO2, no anion gap, she does not appear ill, she is not tachycardic, she is normotensive, her blood counts are unremarkable, she will need a pelvic exam with wet prep and STD testing, check TSH, give IV fluids, recheck CBG, appears stable enough to have close follow-up in the outpatient setting. She reports that she does not take her metformin because she thinks it is too much medication  The patient's pelvic exam is unremarkable - labs pending.  Fluids given,   At change of shift - pt stable, VS without concern - fluids being given, Dr. Roxanne Mins to dispo if and when fluids finished and patient completed tx course with CBG recheck.  Meds given in ED:  Medications  fluconazole (DIFLUCAN) tablet 150 mg (not administered)  sodium chloride 0.9 % bolus 1,000 mL (1,000 mLs Intravenous New Bag/Given 05/31/15 2337)  sodium chloride 0.9 % bolus 1,000 mL (1,000 mLs Intravenous New Bag/Given 05/31/15 2337)    New Prescriptions   METRONIDAZOLE (FLAGYL) 500 MG TABLET     Take 1 tablet (500 mg total) by mouth 2 (two) times daily.      Noemi Chapel, MD 06/01/15 [phone]

## 2015-05-31 NOTE — ED Notes (Signed)
The pt is c/o loosing weight feeling weak no appetite.  She is also c/o breast pain  For 5 months.  lmp none

## 2015-06-01 LAB — CBG MONITORING, ED: GLUCOSE-CAPILLARY: 291 mg/dL — AB (ref 65–99)

## 2015-06-01 LAB — GC/CHLAMYDIA PROBE AMP (~~LOC~~) NOT AT ARMC
CHLAMYDIA, DNA PROBE: NEGATIVE
Neisseria Gonorrhea: NEGATIVE

## 2015-06-01 LAB — HIV ANTIBODY (ROUTINE TESTING W REFLEX): HIV Screen 4th Generation wRfx: NONREACTIVE

## 2015-06-01 LAB — RPR: RPR Ser Ql: NONREACTIVE

## 2015-06-01 MED ORDER — METRONIDAZOLE 500 MG PO TABS: 500.0000 mg | ORAL_TABLET | Freq: Two times a day (BID) | ORAL | Status: DC

## 2015-06-01 MED ORDER — FLUCONAZOLE 100 MG PO TABS
150.0000 mg | ORAL_TABLET | Freq: Once | ORAL | Status: AC
Start: 2015-06-01 — End: 2015-06-01
  Administered 2015-06-01: 150 mg via ORAL
  Filled 2015-06-01: qty 2

## 2015-06-01 NOTE — ED Notes (Signed)
[id number] 

## 2015-06-01 NOTE — ED Provider Notes (Addendum)
Patient initially seen and evaluated by Dr. Sabra Heck, treated with IV fluids. Workup did show evidence of yeast and clue cells on vaginal swab. She feels much better after IV hydration. Dr. Sabra Heck had prescribed metronidazole for bacterial vaginosis. She has been given fluconazole in the ED.  Delora Fuel, MD [id number] [phone]  Delora Fuel, MD [id number] [phone]

## 2015-06-01 NOTE — Discharge Instructions (Signed)

## 2015-06-30 ENCOUNTER — Encounter (HOSPITAL_COMMUNITY): Payer: Self-pay

## 2015-06-30 ENCOUNTER — Emergency Department (HOSPITAL_COMMUNITY)
Admission: EM | Admit: 2015-06-30 | Discharge: 2015-07-01 | Disposition: A | Discharge status: Home / Self Care | Payer: Self-pay | Attending: Emergency Medicine | Admitting: Emergency Medicine

## 2015-06-30 DIAGNOSIS — I10 Essential (primary) hypertension: Secondary | ICD-10-CM | POA: Insufficient documentation

## 2015-06-30 DIAGNOSIS — R5383 Other fatigue: Secondary | ICD-10-CM | POA: Insufficient documentation

## 2015-06-30 DIAGNOSIS — K5900 Constipation, unspecified: Secondary | ICD-10-CM | POA: Insufficient documentation

## 2015-06-30 DIAGNOSIS — F209 Schizophrenia, unspecified: Secondary | ICD-10-CM | POA: Insufficient documentation

## 2015-06-30 DIAGNOSIS — R1084 Generalized abdominal pain: Secondary | ICD-10-CM

## 2015-06-30 DIAGNOSIS — F319 Bipolar disorder, unspecified: Secondary | ICD-10-CM | POA: Insufficient documentation

## 2015-06-30 DIAGNOSIS — Z7984 Long term (current) use of oral hypoglycemic drugs: Secondary | ICD-10-CM | POA: Insufficient documentation

## 2015-06-30 DIAGNOSIS — R630 Anorexia: Secondary | ICD-10-CM | POA: Insufficient documentation

## 2015-06-30 DIAGNOSIS — R110 Nausea: Secondary | ICD-10-CM | POA: Insufficient documentation

## 2015-06-30 DIAGNOSIS — Z3202 Encounter for pregnancy test, result negative: Secondary | ICD-10-CM | POA: Insufficient documentation

## 2015-06-30 DIAGNOSIS — F419 Anxiety disorder, unspecified: Secondary | ICD-10-CM | POA: Insufficient documentation

## 2015-06-30 DIAGNOSIS — Z87891 Personal history of nicotine dependence: Secondary | ICD-10-CM | POA: Insufficient documentation

## 2015-06-30 DIAGNOSIS — N898 Other specified noninflammatory disorders of vagina: Secondary | ICD-10-CM | POA: Insufficient documentation

## 2015-06-30 DIAGNOSIS — R739 Hyperglycemia, unspecified: Secondary | ICD-10-CM

## 2015-06-30 DIAGNOSIS — Z79899 Other long term (current) drug therapy: Secondary | ICD-10-CM | POA: Insufficient documentation

## 2015-06-30 DIAGNOSIS — E1165 Type 2 diabetes mellitus with hyperglycemia: Secondary | ICD-10-CM | POA: Insufficient documentation

## 2015-06-30 DIAGNOSIS — R634 Abnormal weight loss: Secondary | ICD-10-CM | POA: Insufficient documentation

## 2015-06-30 LAB — I-STAT CHEM 8, ED
BUN: 13 mg/dL (ref 6–20)
CREATININE: 0.7 mg/dL (ref 0.44–1.00)
Calcium, Ion: 1.17 mmol/L (ref 1.12–1.23)
Chloride: 99 mmol/L — ABNORMAL LOW (ref 101–111)
GLUCOSE: 425 mg/dL — AB (ref 65–99)
HEMATOCRIT: 39 % (ref 36.0–46.0)
HEMOGLOBIN: 13.3 g/dL (ref 12.0–15.0)
Potassium: 4 mmol/L (ref 3.5–5.1)
Sodium: 139 mmol/L (ref 135–145)
TCO2: 27 mmol/L (ref 0–100)

## 2015-06-30 LAB — CBC
HCT: 36.1 % (ref 36.0–46.0)
Hemoglobin: 12.1 g/dL (ref 12.0–15.0)
MCH: 28.9 pg (ref 26.0–34.0)
MCHC: 33.5 g/dL (ref 30.0–36.0)
MCV: 86.4 fL (ref 78.0–100.0)
Platelets: 283 10*3/uL (ref 150–400)
RBC: 4.18 MIL/uL (ref 3.87–5.11)
RDW: 12.3 % (ref 11.5–15.5)
WBC: 5.2 10*3/uL (ref 4.0–10.5)

## 2015-06-30 LAB — URINALYSIS, ROUTINE W REFLEX MICROSCOPIC
Bilirubin Urine: NEGATIVE
Glucose, UA: >1000 mg/dL — AB
Hgb urine dipstick: NEGATIVE
Ketones, ur: NEGATIVE mg/dL
Leukocytes, UA: NEGATIVE
Nitrite: NEGATIVE
Protein, ur: NEGATIVE mg/dL
Specific Gravity, Urine: 1.042 — ABNORMAL HIGH (ref 1.005–1.030)
pH: 7 (ref 5.0–8.0)

## 2015-06-30 LAB — URINE MICROSCOPIC-ADD ON

## 2015-06-30 LAB — CBG MONITORING, ED: Glucose-Capillary: 392 mg/dL — ABNORMAL HIGH (ref 65–99)

## 2015-06-30 MED ORDER — MORPHINE SULFATE (PF) 4 MG/ML IV SOLN
4.0000 mg | Freq: Once | INTRAVENOUS | Status: AC
  Administered 2015-06-30: 4 mg via INTRAVENOUS
  Filled 2015-06-30: qty 1

## 2015-06-30 MED ORDER — ONDANSETRON HCL 4 MG/2ML IJ SOLN
4.0000 mg | Freq: Once | INTRAMUSCULAR | Status: AC
  Administered 2015-06-30: 4 mg via INTRAVENOUS
  Filled 2015-06-30: qty 2

## 2015-06-30 MED ORDER — SODIUM CHLORIDE 0.9 % IV BOLUS (SEPSIS)
1000.0000 mL | Freq: Once | INTRAVENOUS | Status: AC
  Administered 2015-06-30: 1000 mL via INTRAVENOUS

## 2015-06-30 NOTE — ED Notes (Signed)
Per EMS: Pt complaining of diffuse abdominal pain x 2 to 3 weeks. Pt complaining of constipation. Was recently seen by PCP given mirolax, no improvement. Pt also complaining of hyperglycemia, CBG with EMS = 487.

## 2015-06-30 NOTE — ED Provider Notes (Signed)
CSN: [id number]     Arrival date & time 06/30/15  2152 History   By signing my name below, I, Forrestine Him, attest that this documentation has been prepared under the direction and in the presence of Ripley Fraise, MD.  Electronically Signed: Forrestine Him, ED Scribe. 06/30/2015. 11:35 PM.   Chief Complaint  Patient presents with  . Abdominal Pain  . Hyperglycemia   Patient is a 55 y.o. female presenting with abdominal pain. The history is provided by the patient. No language interpreter was used.  Abdominal Pain Pain location:  LUQ and RUQ Pain quality: burning   Pain radiates to:  Back Pain severity:  Moderate Onset quality:  Gradual Duration:  24 weeks Timing:  Constant Relieved by:  Nothing Worsened by:  Palpation Associated symptoms: constipation, fatigue, nausea and vaginal discharge   Associated symptoms: no chest pain, no chills, no diarrhea, no dysuria, no fever and no shortness of breath     HPI Comments: SHADEA INGERMAN is a 55 y.o. female with a PMHx of HTN and DM who presents to the Emergency Department complaining of constant, ongoing lower abdominal pain that radiates to the back x 6 months; worsened in last 3 weeks. Pain is described as burning. Discomfort is exacerbated with deep pressure to area. No alleviating factors at this time. She also reports intermittent nausea, decreased appetite, unexpected weight loss, vaginal discharge, and fatigue. Prescribed Miralax attempted prior to arrival without any improvement. However, pt states she was able to have a small bowel movement this morning.  No recent fever, chills, vomiting, chest pain, or shortness of breath.    PCP: Carmie Kanner, NP    Past Medical History  Diagnosis Date  . Hypertension   . Diabetes mellitus   . Bipolar 1 disorder ([place])   . Schizophrenia ([place])   . Anxiety   . Depression    History reviewed. No pertinent past surgical history. History reviewed. No pertinent family history. Social  History  Substance Use Topics  . Smoking status: Former Smoker    Types: Cigars    Quit date: 03/14/2013  . Smokeless tobacco: None  . Alcohol Use: No     Comment: every weekend   OB History    No data available     Review of Systems  Constitutional: Positive for appetite change, fatigue and unexpected weight change. Negative for fever and chills.  Respiratory: Negative for shortness of breath.   Cardiovascular: Negative for chest pain.  Gastrointestinal: Positive for nausea, abdominal pain and constipation. Negative for diarrhea.  Genitourinary: Positive for vaginal discharge. Negative for dysuria.  Neurological: Negative for headaches.  Psychiatric/Behavioral: Negative for confusion.  All other systems reviewed and are negative.     Allergies  Metformin and related  Home Medications   Prior to Admission medications   Medication Sig Start Date End Date Taking? Authorizing Provider  busPIRone (BUSPAR) 15 MG tablet Take 7.5 mg by mouth 2 (two) times daily. Reported on 05/31/2015   Yes Historical Provider, MD  glipiZIDE (GLUCOTROL) 10 MG tablet Take 10 mg by mouth 2 (two) times daily before a meal.   Yes Historical Provider, MD  lisinopril (PRINIVIL,ZESTRIL) 20 MG tablet Take 20 mg by mouth daily.   Yes Historical Provider, MD  metFORMIN (GLUCOPHAGE) 500 MG tablet Take 1,000 mg by mouth 2 (two) times daily with a meal.    Yes Historical Provider, MD  Multiple Vitamin (MULTIVITAMIN) tablet Take 1 tablet by mouth daily.   Yes Historical Provider, MD  QUEtiapine (SEROQUEL) 50 MG tablet Take 3 tablets (150 mg total) by mouth at bedtime. For mood control/depression Patient taking differently: Take 50 mg by mouth at bedtime as needed (for mood control/depression). For mood control/depression 05/11/14  Yes Shuvon B Rankin, NP  sertraline (ZOLOFT) 100 MG tablet Take 1 tablet (100 mg total) by mouth daily. For depression 05/11/14  Yes Shuvon B Rankin, NP  simvastatin (ZOCOR) 20 MG tablet  Take 1 tablet (20 mg total) by mouth daily at 6 PM. Patient taking differently: Take 20 mg by mouth 2 (two) times a week.  09/03/13  Yes Niel Hummer, NP  traZODone (DESYREL) 50 MG tablet Take 50 mg by mouth at bedtime.   Yes Historical Provider, MD   Triage Vitals: BP 169/87 mmHg  Pulse 67  Resp 13  SpO2 100%   Physical Exam  CONSTITUTIONAL: Well developed/well nourished HEAD: Normocephalic/atraumatic EYES: EOMI/PERRL ENMT: Mucous membranes moist NECK: supple no meningeal signs SPINE/BACK:entire spine nontender CV: S1/S2 noted, no murmurs/rubs/gallops noted LUNGS: Lungs are clear to auscultation bilaterally, no apparent distress ABDOMEN: soft, diffuse moderate tenderness, bowel sounds noted throughout abdomen GU:no cva tenderness NEURO: Pt is awake/alert/appropriate, moves all extremitiesx4.  No facial droop.   EXTREMITIES: pulses normal/equal, full ROM SKIN: warm, color normal PSYCH: no abnormalities of mood noted, alert and oriented to situation   ED Course  Procedures   DIAGNOSTIC STUDIES: Oxygen Saturation is 100% on RA, Normal by my interpretation.    COORDINATION OF CARE: 11:28 PM- Will order blood work, imaging, urinalysis, and EKG. Will give fluids, Zofran, and Morphine. Discussed treatment plan with pt at bedside and pt agreed to plan.    2:19 AM Due to significant tenderness on exam, will proceed with CT imaging Pt stable at this time, reports improved pain at this time   CT scan negative for acute disease (discussed findings of cholelithiasis) Pt resting comfortably She is stable for d/c home  Labs Review Labs Reviewed  URINALYSIS, ROUTINE W REFLEX MICROSCOPIC (NOT AT [facility]) - Abnormal; Notable for the following:    Specific Gravity, Urine 1.042 (*)    Glucose, UA >1000 (*)    All other components within normal limits  URINE MICROSCOPIC-ADD ON - Abnormal; Notable for the following:    Squamous Epithelial / LPF 0-5 (*)    Bacteria, UA FEW (*)    All other  components within normal limits  I-STAT CHEM 8, ED - Abnormal; Notable for the following:    Chloride 99 (*)    Glucose, Bld 425 (*)    All other components within normal limits  CBG MONITORING, ED - Abnormal; Notable for the following:    Glucose-Capillary 392 (*)    All other components within normal limits  CBC  PREGNANCY, URINE    Imaging Review Ct Abdomen Pelvis W Contrast  07/01/2015  CLINICAL DATA:  Diffuse abdominal pain and burning sensation for week. Nausea, vomiting and diarrhea. History of hypertension, diabetes. EXAM: CT ABDOMEN AND PELVIS WITH CONTRAST TECHNIQUE: Multidetector CT imaging of the abdomen and pelvis was performed using the standard protocol following bolus administration of intravenous contrast. CONTRAST:  171mL ISOVUE-300 IOPAMIDOL (ISOVUE-300) INJECTION 61% COMPARISON:  None. FINDINGS: Moderate patient motion. LUNG BASES: Included view of the lung bases are clear. Visualized heart and pericardium are unremarkable. SOLID ORGANS: The liver, spleen, and adrenal glands are unremarkable. Two subcentimeter gallstones without CT findings of acute cholecystitis. Fatty infiltration of the pancreatic head and proximal body without acute component. GASTROINTESTINAL TRACT: Small hiatal  hernia. The stomach, small and large bowel are normal in course and caliber without inflammatory changes. The appendix is not discretely identified, however there are no inflammatory changes in the right lower quadrant. KIDNEYS/ URINARY TRACT: Kidneys are orthotopic, demonstrating symmetric enhancement. No nephrolithiasis, hydronephrosis or solid renal masses. The unopacified ureters are normal in course and caliber. Delayed imaging through the kidneys demonstrates symmetric prompt contrast excretion within the proximal urinary collecting system. Urinary bladder is well distended and unremarkable. PERITONEUM/RETROPERITONEUM: Aortoiliac vessels are normal in course and caliber: Mild calcific  atherosclerosis. No lymphadenopathy by CT size criteria. Internal reproductive organs are unremarkable. No intraperitoneal free fluid nor free air. SOFT TISSUE/OSSEOUS STRUCTURES: Non-suspicious. Moderate bilateral sacroiliac osteoarthrosis. Moderate L5-S1 facet arthropathy. Small fat containing umbilical hernia. IMPRESSION: No acute intra-abdominal or pelvic process on this moderate motion degraded examination. Cholelithiasis. Electronically Signed   By: Elon Alas M.D.   On: 07/01/2015 04:12   I have personally reviewed and evaluated these  lab results as part of my medical decision-making.   EKG Interpretation   Date/Time:  Friday Jun 30 2015 23:49:45 EDT Ventricular Rate:  62 PR Interval:  114 QRS Duration: 91 QT Interval:  444 QTC Calculation: 451 R Axis:   68 Text Interpretation:  Sinus rhythm Borderline short PR interval No  significant change since last tracing Confirmed by Christy Gentles  MD, Marrell Dicaprio  [phone]) on 06/30/2015 11:54:53 PM     Medications  sodium chloride 0.9 % bolus 1,000 mL (0 mLs Intravenous Stopped 07/01/15 0006)  ondansetron (ZOFRAN) injection 4 mg (4 mg Intravenous Given 06/30/15 2348)  morphine 4 MG/ML injection 4 mg (4 mg Intravenous Given 06/30/15 2348)  sodium chloride 0.9 % bolus 1,000 mL (0 mLs Intravenous Stopped 07/01/15 0412)  iopamidol (ISOVUE-300) 61 % injection (100 mLs  Contrast Given 07/01/15 0342)    MDM   Final diagnoses:  Generalized abdominal pain  Hyperglycemia    Nursing notes including past medical history and social history reviewed and considered in documentation Labs/vital reviewed myself and considered during evaluation   I personally performed the services described in this documentation, which was scribed in my presence. The recorded information has been reviewed and is accurate.       Ripley Fraise, MD 07/01/15 [phone]

## 2015-07-01 ENCOUNTER — Encounter (HOSPITAL_COMMUNITY): Payer: Self-pay | Admitting: Radiology

## 2015-07-01 ENCOUNTER — Emergency Department (HOSPITAL_COMMUNITY): Payer: Self-pay

## 2015-07-01 LAB — CBG MONITORING, ED: GLUCOSE-CAPILLARY: 258 mg/dL — AB (ref 65–99)

## 2015-07-01 LAB — PREGNANCY, URINE: PREG TEST UR: NEGATIVE

## 2015-07-01 MED ORDER — IOPAMIDOL (ISOVUE-300) INJECTION 61%
INTRAVENOUS | Status: AC
  Administered 2015-07-01: 100 mL
  Filled 2015-07-01: qty 100

## 2015-07-01 NOTE — ED Notes (Signed)
CBG resulted: 258. RN notified.

## 2015-07-01 NOTE — ED Notes (Signed)
Pt ambulated to bathroom 

## 2015-07-01 NOTE — Discharge Instructions (Signed)

## 2015-07-01 NOTE — ED Notes (Signed)
Patient transported to CT 

## 2015-08-09 ENCOUNTER — Encounter (HOSPITAL_COMMUNITY): Payer: Self-pay

## 2015-08-09 ENCOUNTER — Emergency Department (HOSPITAL_COMMUNITY)
Admission: EM | Admit: 2015-08-09 | Discharge: 2015-08-09 | Disposition: A | Discharge status: Home / Self Care | Payer: Self-pay | Attending: Emergency Medicine | Admitting: Emergency Medicine

## 2015-08-09 DIAGNOSIS — Z79899 Other long term (current) drug therapy: Secondary | ICD-10-CM | POA: Insufficient documentation

## 2015-08-09 DIAGNOSIS — B3731 Acute candidiasis of vulva and vagina: Secondary | ICD-10-CM

## 2015-08-09 DIAGNOSIS — Z87891 Personal history of nicotine dependence: Secondary | ICD-10-CM | POA: Insufficient documentation

## 2015-08-09 DIAGNOSIS — N760 Acute vaginitis: Secondary | ICD-10-CM | POA: Insufficient documentation

## 2015-08-09 DIAGNOSIS — I10 Essential (primary) hypertension: Secondary | ICD-10-CM | POA: Insufficient documentation

## 2015-08-09 DIAGNOSIS — B373 Candidiasis of vulva and vagina: Secondary | ICD-10-CM

## 2015-08-09 DIAGNOSIS — E1165 Type 2 diabetes mellitus with hyperglycemia: Secondary | ICD-10-CM | POA: Insufficient documentation

## 2015-08-09 DIAGNOSIS — R739 Hyperglycemia, unspecified: Secondary | ICD-10-CM

## 2015-08-09 DIAGNOSIS — Z7984 Long term (current) use of oral hypoglycemic drugs: Secondary | ICD-10-CM | POA: Insufficient documentation

## 2015-08-09 DIAGNOSIS — N390 Urinary tract infection, site not specified: Secondary | ICD-10-CM

## 2015-08-09 LAB — CBC
HCT: 38.9 % (ref 36.0–46.0)
HEMOGLOBIN: 13.4 g/dL (ref 12.0–15.0)
MCH: 29.8 pg (ref 26.0–34.0)
MCHC: 34.4 g/dL (ref 30.0–36.0)
MCV: 86.6 fL (ref 78.0–100.0)
PLATELETS: 322 10*3/uL (ref 150–400)
RBC: 4.49 MIL/uL (ref 3.87–5.11)
RDW: 12.5 % (ref 11.5–15.5)
WBC: 6.8 10*3/uL (ref 4.0–10.5)

## 2015-08-09 LAB — URINALYSIS, ROUTINE W REFLEX MICROSCOPIC
Bilirubin Urine: NEGATIVE
HGB URINE DIPSTICK: NEGATIVE
Ketones, ur: NEGATIVE mg/dL
Nitrite: NEGATIVE
Protein, ur: NEGATIVE mg/dL
SPECIFIC GRAVITY, URINE: 1.042 — AB (ref 1.005–1.030)
pH: 6 (ref 5.0–8.0)

## 2015-08-09 LAB — LIPASE, BLOOD: LIPASE: 19 U/L (ref 11–51)

## 2015-08-09 LAB — COMPREHENSIVE METABOLIC PANEL
ALBUMIN: 4 g/dL (ref 3.5–5.0)
ALT: 19 U/L (ref 14–54)
AST: 15 U/L (ref 15–41)
Alkaline Phosphatase: 88 U/L (ref 38–126)
Anion gap: 9 (ref 5–15)
BUN: 11 mg/dL (ref 6–20)
CHLORIDE: 97 mmol/L — AB (ref 101–111)
CO2: 27 mmol/L (ref 22–32)
CREATININE: 0.72 mg/dL (ref 0.44–1.00)
Calcium: 9.7 mg/dL (ref 8.9–10.3)
GFR calc non Af Amer: >60 mL/min (ref >60)
Glucose, Bld: 514 mg/dL (ref 65–99)
Potassium: 4 mmol/L (ref 3.5–5.1)
SODIUM: 133 mmol/L — AB (ref 135–145)
Total Bilirubin: 1.3 mg/dL — ABNORMAL HIGH (ref 0.3–1.2)
Total Protein: 7.4 g/dL (ref 6.5–8.1)

## 2015-08-09 LAB — URINE MICROSCOPIC-ADD ON

## 2015-08-09 LAB — CBG MONITORING, ED: GLUCOSE-CAPILLARY: 312 mg/dL — AB (ref 65–99)

## 2015-08-09 MED ORDER — OXYCODONE-ACETAMINOPHEN 5-325 MG PO TABS
1.0000 | ORAL_TABLET | ORAL | Status: DC | PRN
  Administered 2015-08-09: 1 via ORAL

## 2015-08-09 MED ORDER — ONDANSETRON HCL 4 MG/2ML IJ SOLN
4.0000 mg | Freq: Once | INTRAMUSCULAR | Status: AC
  Administered 2015-08-09: 4 mg via INTRAVENOUS
  Filled 2015-08-09: qty 2

## 2015-08-09 MED ORDER — FLUCONAZOLE 150 MG PO TABS: ORAL_TABLET | ORAL | Status: DC

## 2015-08-09 MED ORDER — METFORMIN HCL 1000 MG PO TABS: 1000.0000 mg | ORAL_TABLET | Freq: Two times a day (BID) | ORAL | Status: DC

## 2015-08-09 MED ORDER — OXYCODONE-ACETAMINOPHEN 5-325 MG PO TABS
ORAL_TABLET | ORAL | Status: AC
  Filled 2015-08-09: qty 1

## 2015-08-09 MED ORDER — FLUCONAZOLE 100 MG PO TABS
150.0000 mg | ORAL_TABLET | Freq: Once | ORAL | Status: AC
  Administered 2015-08-09: 150 mg via ORAL
  Filled 2015-08-09: qty 2

## 2015-08-09 MED ORDER — CEPHALEXIN 500 MG PO CAPS: 500.0000 mg | ORAL_CAPSULE | Freq: Three times a day (TID) | ORAL | Status: DC

## 2015-08-09 MED ORDER — SODIUM CHLORIDE 0.9 % IV BOLUS (SEPSIS)
1000.0000 mL | Freq: Once | INTRAVENOUS | Status: AC
  Administered 2015-08-09: 1000 mL via INTRAVENOUS

## 2015-08-09 MED ORDER — MORPHINE SULFATE (PF) 4 MG/ML IV SOLN
4.0000 mg | INTRAVENOUS | Status: DC | PRN
  Administered 2015-08-09: 4 mg via INTRAVENOUS
  Filled 2015-08-09: qty 1

## 2015-08-09 MED ORDER — DEXTROSE 5 % IV SOLN
1.0000 g | Freq: Once | INTRAVENOUS | Status: AC
  Administered 2015-08-09: 1 g via INTRAVENOUS
  Filled 2015-08-09: qty 10

## 2015-08-09 MED ORDER — INSULIN ASPART 100 UNIT/ML ~~LOC~~ SOLN
10.0000 [IU] | Freq: Once | SUBCUTANEOUS | Status: AC
  Administered 2015-08-09: 10 [IU] via SUBCUTANEOUS
  Filled 2015-08-09: qty 1

## 2015-08-09 NOTE — ED Notes (Signed)
Case manager at bedside 

## 2015-08-09 NOTE — ED Provider Notes (Signed)
CSN: [id number]     Arrival date & time 08/09/15  1637 History   First MD Initiated Contact with Patient 08/09/15 1828     Chief Complaint  Patient presents with  . Abdominal Pain     HPI   Patient presents for evaluation of multiple complaints. States she's got pain in her left side of her abdomen and her suprapubic abdomen. States she has some itching vaginal discharge. She is crying his examiners and states immediately "I have gallstones". Has been diagnosed with diabetes. Has been newly on Glucophage, and glipizide. Has chronic dysuria and chronic weight loss. Head 30 pounds over the last year. Multiple episodes of bacterial vaginosis and yeast vaginosis. Constellation of symptoms sounds like UTI with uncontrolled diabetes chronic weight loss polyuria.  She states that at times when she feels stressed she feels tightness around her head when she moves her eyes she feels like something moves "father back and my scalp". It sounds like she's describing muscle tension. Is not having parasitosis or feeling that there are bugs in her on her period is not paranoid or hallucinating or psychotic.  He is emotionally distraught. She states that she was seen at [facility] clinic but left there because she wants to "get my own car to go to the [facility] clinic".   Past Medical History  Diagnosis Date  . Hypertension   . Diabetes mellitus   . Bipolar 1 disorder ([place])   . Schizophrenia ([place])   . Anxiety   . Depression    History reviewed. No pertinent past surgical history. History reviewed. No pertinent family history. Social History  Substance Use Topics  . Smoking status: Former Smoker    Types: Cigars    Quit date: 03/14/2013  . Smokeless tobacco: None  . Alcohol Use: No     Comment: every weekend   OB History    No data available     Review of Systems  Constitutional: Positive for unexpected weight change. Negative for fever, chills, diaphoresis, appetite change and fatigue.  HENT:  Negative for mouth sores, sore throat and trouble swallowing.   Eyes: Negative for visual disturbance.  Respiratory: Negative for cough, chest tightness, shortness of breath and wheezing.   Cardiovascular: Negative for chest pain.  Gastrointestinal: Negative for nausea, vomiting, abdominal pain, diarrhea and abdominal distention.  Endocrine: Negative for polydipsia, polyphagia and polyuria.  Genitourinary: Positive for dysuria, frequency, flank pain and vaginal discharge. Negative for hematuria.  Musculoskeletal: Negative for gait problem.  Skin: Negative for color change, pallor and rash.  Neurological: Negative for dizziness, syncope, light-headedness and headaches.  Hematological: Does not bruise/bleed easily.  Psychiatric/Behavioral: Negative for behavioral problems and confusion.      Allergies  Metformin and related  Home Medications   Prior to Admission medications   Medication Sig Start Date End Date Taking? Authorizing Provider  busPIRone (BUSPAR) 15 MG tablet Take 7.5 mg by mouth 2 (two) times daily. Reported on 05/31/2015   Yes Historical Provider, MD  gabapentin (NEURONTIN) 300 MG capsule Take 300 mg by mouth at bedtime.   Yes Historical Provider, MD  glipiZIDE (GLUCOTROL) 10 MG tablet Take 10 mg by mouth 2 (two) times daily before a meal.   Yes Historical Provider, MD  lisinopril (PRINIVIL,ZESTRIL) 20 MG tablet Take 20 mg by mouth daily.   Yes Historical Provider, MD  Multiple Vitamin (MULTIVITAMIN) tablet Take 1 tablet by mouth daily.   Yes Historical Provider, MD  naproxen sodium (ANAPROX) 220 MG tablet Take 220 mg  by mouth at bedtime as needed (for pain).   Yes Historical Provider, MD  QUEtiapine (SEROQUEL) 50 MG tablet Take 3 tablets (150 mg total) by mouth at bedtime. For mood control/depression Patient taking differently: Take 50 mg by mouth at bedtime as needed (for mood control/depression). For mood control/depression 05/11/14  Yes Shuvon B Rankin, NP  sertraline  (ZOLOFT) 100 MG tablet Take 1 tablet (100 mg total) by mouth daily. For depression 05/11/14  Yes Shuvon B Rankin, NP  simvastatin (ZOCOR) 20 MG tablet Take 1 tablet (20 mg total) by mouth daily at 6 PM. Patient taking differently: Take 20 mg by mouth 2 (two) times a week.  09/03/13  Yes Niel Hummer, NP  traZODone (DESYREL) 50 MG tablet Take 25-50 mg by mouth at bedtime as needed for sleep.    Yes Historical Provider, MD  cephALEXin (KEFLEX) 500 MG capsule Take 1 capsule (500 mg total) by mouth 3 (three) times daily. 08/09/15   Tanna Furry, MD  fluconazole (DIFLUCAN) 150 MG tablet Take single dose of fluconazole after your last day of Keflex. 08/09/15   Tanna Furry, MD  metFORMIN (GLUCOPHAGE) 1000 MG tablet Take 1 tablet (1,000 mg total) by mouth 2 (two) times daily. 08/09/15   Tanna Furry, MD   BP 137/77 mmHg  Pulse 60  Temp(Src) 98.8 F (37.1 C) (Oral)  Resp 18  Ht 5\' 2"  (1.575 m)  Wt 119 lb 1.6 oz (54.023 kg)  BMI 21.78 kg/m2  SpO2 100% Physical Exam  Constitutional: She is oriented to person, place, and time. She appears well-developed and well-nourished. No distress.  HENT:  Head: Normocephalic.  Eyes: Conjunctivae are normal. Pupils are equal, round, and reactive to light. No scleral icterus.  Neck: Normal range of motion. Neck supple. No thyromegaly present.  Cardiovascular: Normal rate and regular rhythm.  Exam reveals no gallop and no friction rub.   No murmur heard. Pulmonary/Chest: Effort normal and breath sounds normal. No respiratory distress. She has no wheezes. She has no rales.  Abdominal: Soft. Bowel sounds are normal. She exhibits no distension. There is no tenderness. There is no rebound.    Musculoskeletal: Normal range of motion.  Neurological: She is alert and oriented to person, place, and time.  Skin: Skin is warm and dry. No rash noted.  Psychiatric: She has a normal mood and affect. Her behavior is normal.    ED Course  Procedures (including critical care  time) Labs Review Labs Reviewed  COMPREHENSIVE METABOLIC PANEL - Abnormal; Notable for the following:    Sodium 133 (*)    Chloride 97 (*)    Glucose, Bld 514 (*)    Total Bilirubin 1.3 (*)    All other components within normal limits  URINALYSIS, ROUTINE W REFLEX MICROSCOPIC (NOT AT [facility]) - Abnormal; Notable for the following:    APPearance CLOUDY (*)    Specific Gravity, Urine 1.042 (*)    Glucose, UA >1000 (*)    Leukocytes, UA MODERATE (*)    All other components within normal limits  URINE MICROSCOPIC-ADD ON - Abnormal; Notable for the following:    Squamous Epithelial / LPF 0-5 (*)    Bacteria, UA MANY (*)    All other components within normal limits  CBG MONITORING, ED - Abnormal; Notable for the following:    Glucose-Capillary 312 (*)    All other components within normal limits  URINE CULTURE  LIPASE, BLOOD  CBC    Imaging Review No results found. I have personally  reviewed and evaluated these images and lab results as part of my medical decision-making.   EKG Interpretation None      MDM   Final diagnoses:  UTI (lower urinary tract infection)  Yeast vaginitis  Hyperglycemia    Mariann Laster, heart care manager has spoken with the patient. Apparently the patient was referred to IR see. She is not homeless. She was upset by the environment there and thus did not go back. Arrangements made for her to have a follow-up appointment at [facility] in 1 week. She can go tomorrow morning to [facility] and fill her prescriptions for Keflex, Glucophage, and Diflucan. I discussed with her multiple times and at length that her weight loss is very likely explained by her poorly controlled diabetes. I written her for higher dose of Glucophage, she will continue her glipizide and follow-up as above.    Tanna Furry, MD 08/09/15 2056

## 2015-08-09 NOTE — Discharge Instructions (Signed)
[facility] and [facility] your prescription. Your weight loss will improve as your blood sugars improved. New prescription for higher dose of metformin. Continue your glipizide at current dose. Prescription for single dose of fluconazole to take for your vaginal yeast infection after you finish your antibiotics for your kidney infection.    Hyperglycemia Hyperglycemia occurs when the glucose (sugar) in your blood is too high. Hyperglycemia can happen for many reasons, but it most often happens to people who do not know they have diabetes or are not managing their diabetes properly.  CAUSES  Whether you have diabetes or not, there are other causes of hyperglycemia. Hyperglycemia can occur when you have diabetes, but it can also occur in other situations that you might not be as aware of, such as: Diabetes  If you have diabetes and are having problems controlling your blood glucose, hyperglycemia could occur because of some of the following reasons:  Not following your meal plan.  Not taking your diabetes medications or not taking it properly.  Exercising less or doing less activity than you normally do.  Being sick. Pre-diabetes  This cannot be ignored. Before people develop Type 2 diabetes, they almost always have "pre-diabetes." This is when your blood glucose levels are higher than normal, but not yet high enough to be diagnosed as diabetes. Research has shown that some long-term damage to the body, especially the heart and circulatory system, may already be occurring during pre-diabetes. If you take action to manage your blood glucose when you have pre-diabetes, you may delay or prevent Type 2 diabetes from developing. Stress  If you have diabetes, you may be "diet" controlled or on oral medications or insulin to control your diabetes. However, you may find that your blood glucose is higher than usual in the hospital whether you have diabetes or not. This  is often referred to as "stress hyperglycemia." Stress can elevate your blood glucose. This happens because of hormones put out by the body during times of stress. If stress has been the cause of your high blood glucose, it can be followed regularly by your caregiver. That way he/she can make sure your hyperglycemia does not continue to get worse or progress to diabetes. Steroids  Steroids are medications that act on the infection fighting system (immune system) to block inflammation or infection. One side effect can be a rise in blood glucose. Most people can produce enough extra insulin to allow for this rise, but for those who cannot, steroids make blood glucose levels go even higher. It is not unusual for steroid treatments to "uncover" diabetes that is developing. It is not always possible to determine if the hyperglycemia will go away after the steroids are stopped. A special blood test called an A1c is sometimes done to determine if your blood glucose was elevated before the steroids were started. SYMPTOMS  Thirsty.  Frequent urination.  Dry mouth.  Blurred vision.  Tired or fatigue.  Weakness.  Sleepy.  Tingling in feet or leg. DIAGNOSIS  Diagnosis is made by monitoring blood glucose in one or all of the following ways:  A1c test. This is a chemical found in your blood.  Fingerstick blood glucose monitoring.  Laboratory results. TREATMENT  First, knowing the cause of the hyperglycemia is important before the hyperglycemia can be treated. Treatment may include, but is not be limited to:  Education.  Change or adjustment in medications.  Change or adjustment in meal plan.  Treatment for  an illness, infection, etc.  More frequent blood glucose monitoring.  Change in exercise plan.  Decreasing or stopping steroids.  Lifestyle changes. HOME CARE INSTRUCTIONS   Test your blood glucose as directed.  Exercise regularly. Your caregiver will give you instructions  about exercise. Pre-diabetes or diabetes which comes on with stress is helped by exercising.  Eat wholesome, balanced meals. Eat often and at regular, fixed times. Your caregiver or nutritionist will give you a meal plan to guide your sugar intake.  Being at an ideal weight is important. If needed, losing as little as 10 to 15 pounds may help improve blood glucose levels. SEEK MEDICAL CARE IF:   You have questions about medicine, activity, or diet.  You continue to have symptoms (problems such as increased thirst, urination, or weight gain). SEEK IMMEDIATE MEDICAL CARE IF:   You are vomiting or have diarrhea.  Your breath smells fruity.  You are breathing faster or slower.  You are very sleepy or incoherent.  You have numbness, tingling, or pain in your feet or hands.  You have chest pain.  Your symptoms get worse even though you have been following your caregiver's orders.  If you have any other questions or concerns.   This information is not intended to replace advice given to you by your health care provider. Make sure you discuss any questions you have with your health care provider.   Document Released: 07/24/2000 Document Revised: 04/22/2011 Document Reviewed: 10/04/2014 Elsevier Interactive Patient Education 2016 Elsevier Inc[employer]  Urinary Tract Infection Urinary tract infections (UTIs) can develop anywhere along your urinary tract. Your urinary tract is your body's drainage system for removing wastes and extra water. Your urinary tract includes two kidneys, two ureters, a bladder, and a urethra. Your kidneys are a pair of bean-shaped organs. Each kidney is about the size of your fist. They are located below your ribs, one on each side of your spine. CAUSES Infections are caused by microbes, which are microscopic organisms, including fungi, viruses, and bacteria. These organisms are so small that they can only be seen through a microscope. Bacteria are the microbes that most  commonly cause UTIs. SYMPTOMS  Symptoms of UTIs may vary by age and gender of the patient and by the location of the infection. Symptoms in young women typically include a frequent and intense urge to urinate and a painful, burning feeling in the bladder or urethra during urination. Older women and men are more likely to be tired, shaky, and weak and have muscle aches and abdominal pain. A fever may mean the infection is in your kidneys. Other symptoms of a kidney infection include pain in your back or sides below the ribs, nausea, and vomiting. DIAGNOSIS To diagnose a UTI, your caregiver will ask you about your symptoms. Your caregiver will also ask you to provide a urine sample. The urine sample will be tested for bacteria and white blood cells. White blood cells are made by your body to help fight infection. TREATMENT  Typically, UTIs can be treated with medication. Because most UTIs are caused by a bacterial infection, they usually can be treated with the use of antibiotics. The choice of antibiotic and length of treatment depend on your symptoms and the type of bacteria causing your infection. HOME CARE INSTRUCTIONS  If you were prescribed antibiotics, take them exactly as your caregiver instructs you. Finish the medication even if you feel better after you have only taken some of the medication.  Drink enough water and  fluids to keep your urine clear or pale yellow.  Avoid caffeine, tea, and carbonated beverages. They tend to irritate your bladder.  Empty your bladder often. Avoid holding urine for long periods of time.  Empty your bladder before and after sexual intercourse.  After a bowel movement, women should cleanse from front to back. Use each tissue only once. SEEK MEDICAL CARE IF:   You have back pain.  You develop a fever.  Your symptoms do not begin to resolve within 3 days. SEEK IMMEDIATE MEDICAL CARE IF:   You have severe back pain or lower abdominal pain.  You  develop chills.  You have nausea or vomiting.  You have continued burning or discomfort with urination. MAKE SURE YOU:   Understand these instructions.  Will watch your condition.  Will get help right away if you are not doing well or get worse.   This information is not intended to replace advice given to you by your health care provider. Make sure you discuss any questions you have with your health care provider.   Document Released: 11/07/2004 Document Revised: 10/19/2014 Document Reviewed: 03/08/2011 Elsevier Interactive Patient Education 2016 Elsevier Inc.  Monilial Vaginitis Vaginitis in a soreness, swelling and redness (inflammation) of the vagina and vulva. Monilial vaginitis is not a sexually transmitted infection. CAUSES  Yeast vaginitis is caused by yeast (candida) that is normally found in your vagina. With a yeast infection, the candida has overgrown in number to a point that upsets the chemical balance. SYMPTOMS   White, thick vaginal discharge.  Swelling, itching, redness and irritation of the vagina and possibly the lips of the vagina (vulva).  Burning or painful urination.  Painful intercourse. DIAGNOSIS  Things that may contribute to monilial vaginitis are:  Postmenopausal and virginal states.  Pregnancy.  Infections.  Being tired, sick or stressed, especially if you had monilial vaginitis in the past.  Diabetes. Good control will help lower the chance.  Birth control pills.  Tight fitting garments.  Using bubble bath, feminine sprays, douches or deodorant tampons.  Taking certain medications that kill germs (antibiotics).  Sporadic recurrence can occur if you become ill. TREATMENT  Your caregiver will give you medication.  There are several kinds of anti monilial vaginal creams and suppositories specific for monilial vaginitis. For recurrent yeast infections, use a suppository or cream in the vagina 2 times a week, or as  directed.  Anti-monilial or steroid cream for the itching or irritation of the vulva may also be used. Get your caregiver's permission.  Painting the vagina with methylene blue solution may help if the monilial cream does not work.  Eating yogurt may help prevent monilial vaginitis. HOME CARE INSTRUCTIONS   Finish all medication as prescribed.  Do not have sex until treatment is completed or after your caregiver tells you it is okay.  Take warm sitz baths.  Do not douche.  Do not use tampons, especially scented ones.  Wear cotton underwear.  Avoid tight pants and panty hose.  Tell your sexual partner that you have a yeast infection. They should go to their caregiver if they have symptoms such as mild rash or itching.  Your sexual partner should be treated as well if your infection is difficult to eliminate.  Practice safer sex. Use condoms.  Some vaginal medications cause latex condoms to fail. Vaginal medications that harm condoms are:  Cleocin cream.  Butoconazole (Femstat).  Terconazole (Terazol) vaginal suppository.  Miconazole (Monistat) (may be purchased over the counter). [place]  CARE IF:   You have a temperature by mouth above 102 F (38.9 C).  The infection is getting worse after 2 days of treatment.  The infection is not getting better after 3 days of treatment.  You develop blisters in or around your vagina.  You develop vaginal bleeding, and it is not your menstrual period.  You have pain when you urinate.  You develop intestinal problems.  You have pain with sexual intercourse.   This information is not intended to replace advice given to you by your health care provider. Make sure you discuss any questions you have with your health care provider.   Document Released: 11/07/2004 Document Revised: 04/22/2011 Document Reviewed: 08/01/2014 Elsevier Interactive Patient Education [employer].

## 2015-08-09 NOTE — ED Notes (Signed)
Pt CBG 312 at this time.

## 2015-08-09 NOTE — ED Notes (Signed)
Notified Dr. Jeneen Rinks of glucose of 514 and no new orders received.  Pt going to room

## 2015-08-09 NOTE — ED Notes (Addendum)
Patient complains of epigastric pain, back pain, vaginal pain and thinks something crawling in her hair. States she was told she has gallstones and thinks the pain related to same. No vomiting. Weight loss. Patient crying during triage and stating I have lost so much I think I am going to die.

## 2015-08-10 ENCOUNTER — Telehealth: Payer: Self-pay | Admitting: *Deleted

## 2015-08-10 MED FILL — metFORMIN HCL 1000 MG TABS: 1000 | 30 days supply | Qty: 60 | Fill #0

## 2015-08-10 MED FILL — CEPHALEXIN 500 MG CAPSULE: 500 | 7 days supply | Qty: 21 | Fill #0

## 2015-08-10 MED FILL — FLUCONAZOLE 150 MG TABLET: 150 | 1 days supply | Qty: 1 | Fill #0

## 2015-08-12 LAB — URINE CULTURE

## 2015-08-13 ENCOUNTER — Telehealth (HOSPITAL_BASED_OUTPATIENT_CLINIC_OR_DEPARTMENT_OTHER): Payer: Self-pay

## 2015-08-13 NOTE — Telephone Encounter (Signed)
Post ED Visit - Positive Culture Follow-up  Culture report reviewed by antimicrobial stewardship pharmacist:  []  Elenor Quinones, Pharm.D. []  Heide Guile, Pharm.D., BCPS []  Parks Neptune, Pharm.D. []  Alycia Rossetti, Pharm.D., BCPS []  [place], Pharm.D., BCPS, AAHIVP []  Legrand Como, Pharm.D., BCPS, AAHIVP [x]  Cassie Stewart, Pharm.D. []  Stephens November, Pharm.D.  Positive urine culture Treated with Cephalexin, organism sensitive to the same and no further patient follow-up is required at this time.  Genia Del 08/13/2015, 9:39 AM

## 2015-08-17 ENCOUNTER — Ambulatory Visit: Payer: Self-pay | Attending: Internal Medicine | Admitting: Physician Assistant

## 2015-08-17 ENCOUNTER — Encounter: Payer: Self-pay | Admitting: Physician Assistant

## 2015-08-17 ENCOUNTER — Inpatient Hospital Stay: Payer: Self-pay | Admitting: Family Medicine

## 2015-08-17 VITALS — BP 123/84 | HR 90 | Temp 98.3°F | Wt 117.2 lb

## 2015-08-17 DIAGNOSIS — E0800 Diabetes mellitus due to underlying condition with hyperosmolarity without nonketotic hyperglycemic-hyperosmolar coma (NKHHC): Secondary | ICD-10-CM

## 2015-08-17 DIAGNOSIS — R739 Hyperglycemia, unspecified: Secondary | ICD-10-CM

## 2015-08-17 DIAGNOSIS — N390 Urinary tract infection, site not specified: Secondary | ICD-10-CM

## 2015-08-17 DIAGNOSIS — G609 Hereditary and idiopathic neuropathy, unspecified: Secondary | ICD-10-CM

## 2015-08-17 LAB — POCT URINALYSIS DIP (MANUAL ENTRY)
Bilirubin, UA: NEGATIVE
Blood, UA: NEGATIVE
Glucose, UA: 500 — AB
Leukocytes, UA: NEGATIVE
Nitrite, UA: NEGATIVE
PROTEIN UA: NEGATIVE
SPEC GRAV UA: 1.01
Urobilinogen, UA: 0.2
pH, UA: 5

## 2015-08-17 LAB — GLUCOSE, POCT (MANUAL RESULT ENTRY): POC Glucose: 342 mg/dl — AB (ref 70–99)

## 2015-08-17 LAB — POCT GLYCOSYLATED HEMOGLOBIN (HGB A1C): Hemoglobin A1C: 13.7

## 2015-08-17 MED ORDER — TRUEPLUS LANCETS 28G MISC: 1.0000 | Freq: Three times a day (TID) | Status: DC

## 2015-08-17 MED ORDER — TRUE METRIX METER W/DEVICE KIT: 1.0000 | PACK | Freq: Three times a day (TID) | Status: DC

## 2015-08-17 MED ORDER — GABAPENTIN 300 MG PO CAPS: 300.0000 mg | ORAL_CAPSULE | Freq: Three times a day (TID) | ORAL | Status: DC

## 2015-08-17 MED ORDER — GLUCOSE BLOOD VI STRP: ORAL_STRIP | Status: DC

## 2015-08-17 MED ORDER — INSULIN GLARGINE 100 UNIT/ML ~~LOC~~ SOLN: 15.0000 [IU] | Freq: Every day | SUBCUTANEOUS | Status: DC

## 2015-08-17 MED FILL — TRUEplus LANCETS 28G MISC: 30 days supply | Qty: 100 | Fill #0

## 2015-08-17 MED FILL — TRUE METRIX TEST STRIP: 30 days supply | Qty: 100 | Fill #0

## 2015-08-17 MED FILL — GABAPENTIN 300 MG CAPSULE: 300 | 30 days supply | Qty: 90 | Fill #0

## 2015-08-17 MED FILL — !LANTUS 100 UNITS/ML VIAL: 100 | 45 days supply | Qty: 10 | Fill #0

## 2015-08-17 MED FILL — !TRUE METRIX BLOOD GLUCOSE: 1 days supply | Qty: 1 | Fill #0
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
## 2015-08-17 NOTE — Patient Instructions (Addendum)
Check blood sugars fasting, after lunch, and at bedtime. Drink lots of water.  Cut back/cut out sugars!

## 2015-08-22 ENCOUNTER — Ambulatory Visit: Payer: Self-pay

## 2015-08-30 ENCOUNTER — Ambulatory Visit: Payer: Self-pay | Attending: Family Medicine | Admitting: Family Medicine

## 2015-08-30 ENCOUNTER — Other Ambulatory Visit: Payer: Self-pay | Admitting: *Deleted

## 2015-08-30 ENCOUNTER — Encounter: Payer: Self-pay | Admitting: Family Medicine

## 2015-08-30 VITALS — BP 130/60 | HR 72 | Temp 97.8°F | Ht 62.0 in | Wt 121.4 lb

## 2015-08-30 DIAGNOSIS — F251 Schizoaffective disorder, depressive type: Secondary | ICD-10-CM

## 2015-08-30 DIAGNOSIS — R5383 Other fatigue: Secondary | ICD-10-CM

## 2015-08-30 DIAGNOSIS — E7800 Pure hypercholesterolemia, unspecified: Secondary | ICD-10-CM

## 2015-08-30 DIAGNOSIS — I10 Essential (primary) hypertension: Secondary | ICD-10-CM

## 2015-08-30 DIAGNOSIS — E1165 Type 2 diabetes mellitus with hyperglycemia: Secondary | ICD-10-CM

## 2015-08-30 LAB — POCT GLYCOSYLATED HEMOGLOBIN (HGB A1C): Hemoglobin A1C: 12.1

## 2015-08-30 LAB — GLUCOSE, POCT (MANUAL RESULT ENTRY): POC Glucose: 160 mg/dl — AB (ref 70–99)

## 2015-08-30 MED ORDER — GLIPIZIDE 10 MG PO TABS: 10.0000 mg | ORAL_TABLET | Freq: Two times a day (BID) | ORAL | Status: DC

## 2015-08-30 MED ORDER — SIMVASTATIN 20 MG PO TABS: 20.0000 mg | ORAL_TABLET | Freq: Every day | ORAL | Status: DC

## 2015-08-30 MED ORDER — LISINOPRIL 20 MG PO TABS: 20.0000 mg | ORAL_TABLET | Freq: Every day | ORAL | Status: DC

## 2015-08-30 MED ORDER — METFORMIN HCL 1000 MG PO TABS: 1000.0000 mg | ORAL_TABLET | Freq: Two times a day (BID) | ORAL | Status: DC

## 2015-08-30 MED ORDER — "INSULIN SYRINGE-NEEDLE U-100 31G X 5/16"" 0.3 ML MISC": 1.0000 | Freq: Three times a day (TID) | Status: DC

## 2015-08-30 MED FILL — ?SIMVASTATIN 20 MG TABLET: 20 MG | 30 days supply | Qty: 30 | Fill #0

## 2015-08-30 MED FILL — TRUEPLUS SYR 0.3ML 31GX5/16: 31G X 5/16" | 25 days supply | Qty: 100 | Fill #0

## 2015-08-30 MED FILL — ?GLIPIZIDE 10 MG TABLET: 10 | 30 days supply | Qty: 60 | Fill #0

## 2015-08-30 MED FILL — ?LISINOPRIL 20 MG TABLET: 20 | 30 days supply | Qty: 30 | Fill #0

## 2015-08-30 NOTE — Progress Notes (Signed)
Subjective:  Patient ID: Lorraine Turner, female    DOB: 07/24/1960  Age: 55 y.o. MRN: [id number]  CC: Establish Care   HPI Lorraine Turner is a 55 year old female with history of type 2 diabetes mellitus (A1c 12.1), hypertension, schizoaffective disorder, hyperlipidemia who comes into the clinic for a follow-up visit.  Her blood sugar log reveals fasting sugars in the 63-150 range and random sugars of 90-160 she endorses compliance with her medications; denies hypoglycemic episodes. The neuropathy she complained of at her last office visit has improved with increased dose of gabapentin. She is up-to-date on annual eye exam.  Blood pressure is slightly elevated despite compliance with antihypertensive.  Simvastatin appears on her medication list but she has not been taking it.  She does have low energy and would like to have blood work to evaluate that   Past Medical History  Diagnosis Date  . Hypertension   . Diabetes mellitus   . Bipolar 1 disorder ([place])   . Schizophrenia ([place])   . Anxiety   . Depression    History reviewed. No pertinent past surgical history.   Allergies  Allergen Reactions  . Metformin And Related Nausea Only    Makes patient feel bad     Outpatient Prescriptions Prior to Visit  Medication Sig Dispense Refill  . Blood Glucose Monitoring Suppl (TRUE METRIX METER) w/Device KIT 1 Device by Does not apply route 3 (three) times daily. 1 kit 0  . busPIRone (BUSPAR) 15 MG tablet Take 7.5 mg by mouth 2 (two) times daily. Reported on 05/31/2015    . gabapentin (NEURONTIN) 300 MG capsule Take 1 capsule (300 mg total) by mouth 3 (three) times daily. 90 capsule 5  . glucose blood (TRUE METRIX BLOOD GLUCOSE TEST) test strip Use as instructed 100 each 12  . glucose blood (TRUE METRIX BLOOD GLUCOSE TEST) test strip Use as instructed 100 each 12  . insulin glargine (LANTUS) 100 UNIT/ML injection Inject 0.15 mLs (15 Units total) into the skin at bedtime. 10 mL  11  . naproxen sodium (ANAPROX) 220 MG tablet Take 220 mg by mouth at bedtime as needed (for pain).    . QUEtiapine (SEROQUEL) 50 MG tablet Take 3 tablets (150 mg total) by mouth at bedtime. For mood control/depression (Patient taking differently: Take 50 mg by mouth at bedtime as needed (for mood control/depression). For mood control/depression) 90 tablet 0  . sertraline (ZOLOFT) 100 MG tablet Take 1 tablet (100 mg total) by mouth daily. For depression 30 tablet 0  . traZODone (DESYREL) 50 MG tablet Take 25-50 mg by mouth at bedtime as needed for sleep.     . TRUEPLUS LANCETS 28G MISC 1 each by Does not apply route 3 (three) times daily. 100 each 12  . glipiZIDE (GLUCOTROL) 10 MG tablet Take 10 mg by mouth daily.     Marland Kitchen lisinopril (PRINIVIL,ZESTRIL) 20 MG tablet Take 20 mg by mouth daily.    . metFORMIN (GLUCOPHAGE) 1000 MG tablet Take 1 tablet (1,000 mg total) by mouth 2 (two) times daily. 60 tablet 0  . simvastatin (ZOCOR) 20 MG tablet Take 1 tablet (20 mg total) by mouth daily at 6 PM. (Patient taking differently: Take 20 mg by mouth 2 (two) times a week. ) 30 tablet 0  . Multiple Vitamin (MULTIVITAMIN) tablet Take 1 tablet by mouth daily. Reported on 08/30/2015    . cephALEXin (KEFLEX) 500 MG capsule Take 1 capsule (500 mg total) by mouth 3 (three) times daily.  21 capsule 0  . fluconazole (DIFLUCAN) 150 MG tablet Take single dose of fluconazole after your last day of Keflex. (Patient not taking: Reported on 08/17/2015) 1 tablet 0   No facility-administered medications prior to visit.    ROS Review of Systems  Constitutional: Negative for activity change, appetite change and fatigue.  HENT: Negative for congestion, sinus pressure and sore throat.   Eyes: Negative for visual disturbance.  Respiratory: Negative for cough, chest tightness, shortness of breath and wheezing.   Cardiovascular: Negative for chest pain and palpitations.  Gastrointestinal: Negative for abdominal pain, constipation and  abdominal distention.  Endocrine: Negative for polydipsia.  Genitourinary: Negative for dysuria and frequency.  Musculoskeletal: Negative for back pain and arthralgias.       Right hip pain which is occasional and relieved by taking Aleve  Skin: Negative for rash.  Neurological: Negative for tremors, light-headedness and numbness.  Hematological: Does not bruise/bleed easily.  Psychiatric/Behavioral: Negative for behavioral problems and agitation.    Objective:  BP 130/60 mmHg  Pulse 72  Temp(Src) 97.8 F (36.6 C) (Oral)  Ht _0  (1.575 m)  Wt 121 lb 6.4 oz (55.067 kg)  BMI 22.20 kg/m2  SpO2 100%  BP/Weight 08/30/2015 08/17/2015 4/74/2595  Systolic BP 638 756 433  Diastolic BP 60 84 68  Wt. (Lbs) 121.4 117.2 119.1  BMI 22.2 21.43 21.78      Physical Exam  Constitutional: She is oriented to person, place, and time. She appears well-developed and well-nourished.  Cardiovascular: Normal rate, normal heart sounds and intact distal pulses.   No murmur heard. Pulmonary/Chest: Effort normal and breath sounds normal. She has no wheezes. She has no rales. She exhibits no tenderness.  Abdominal: Soft. Bowel sounds are normal. She exhibits no distension and no mass. There is no tenderness.  Musculoskeletal: Normal range of motion.  Neurological: She is alert and oriented to person, place, and time.  Skin: Skin is warm and dry.  Psychiatric: She has a normal mood and affect.   CMP Latest Ref Rng 08/09/2015 06/30/2015 05/31/2015  Glucose 65 - 99 mg/dL 514(HH) 425(H) 367(H)  BUN 6 - 20 mg/dL _1 Creatinine 0.44 - 1.00 mg/dL 0.72 0.70 0.77  Sodium 135 - 145 mmol/L 133(L) 139 135  Potassium 3.5 - 5.1 mmol/L 4.0 4.0 3.8  Chloride 101 - 111 mmol/L 97(L) 99(L) 96(L)  CO2 22 - 32 mmol/L 27 - 26  Calcium 8.9 - 10.3 mg/dL 9.7 - 9.6  Total Protein 6.5 - 8.1 g/dL 7.4 - 8.0  Total Bilirubin 0.3 - 1.2 mg/dL 1.3(H) - 1.0  Alkaline Phos 38 - 126 U/L 88 - 84  AST 15 - 41 U/L 15 - 14(L)  ALT  14 - 54 U/L 19 - 17     Lipid Panel     Component Value Date/Time   CHOL 225* 05/08/2014 0630   TRIG 169* 05/08/2014 0630   HDL 40 05/08/2014 0630   CHOLHDL 5.6 05/08/2014 0630   VLDL 34 05/08/2014 0630   LDLCALC 151* 05/08/2014 0630     Lab Results  Component Value Date   HGBA1C 12.1 08/30/2015    Assessment & Plan:   1. Type 2 diabetes mellitus with hyperglycemia, without long-term current use of insulin (HCC) Uncontrolled with A1c of 12.1 but blood sugar log reviews reveal improvement She has been taking glipizide once daily rather than twice daily and I have advised on Coreg dose. Educated about hypoglycemia and she reduce her Lantus by 2  units if hypoglycemia occurs. Up-to-date on eye exam; foot exam performed today Clinical pharmacist to see her today. At here to ADA diet. - POCT glycosylated hemoglobin (Hb A1C) - POCT glucose (manual entry) - glipiZIDE (GLUCOTROL) 10 MG tablet; Take 1 tablet (10 mg total) by mouth 2 (two) times daily before a meal.  Dispense: 60 tablet; Refill: 3 - metFORMIN (GLUCOPHAGE) 1000 MG tablet; Take 1 tablet (1,000 mg total) by mouth 2 (two) times daily.  Dispense: 60 tablet; Refill: 3 - Microalbumin / creatinine urine ratio; Future  2. Schizoaffective disorder, depressive type [facility]) Patient advised to schedule an appointment with [facility] so she can receive refill of her medications  3. Essential hypertension Blood pressure initially elevated but repeat performed manually was normal - lisinopril (PRINIVIL,ZESTRIL) 20 MG tablet; Take 1 tablet (20 mg total) by mouth daily.  Dispense: 30 tablet; Refill: 3  4. HYPERCHOLESTEROLEMIA She has not been on Zocor and if lipids are elevated I will make no changes to her regimen. Low cholesterol diet - simvastatin (ZOCOR) 20 MG tablet; Take 1 tablet (20 mg total) by mouth daily at 6 PM.  Dispense: 30 tablet; Refill: 3 - Lipid panel; Future  5. Other fatigue - VITAMIN D 25 Hydroxy (Vit-D  Deficiency, Fractures); Future - TSH; Future   Meds ordered this encounter  Medications  . glipiZIDE (GLUCOTROL) 10 MG tablet    Sig: Take 1 tablet (10 mg total) by mouth 2 (two) times daily before a meal.    Dispense:  60 tablet    Refill:  3  . lisinopril (PRINIVIL,ZESTRIL) 20 MG tablet    Sig: Take 1 tablet (20 mg total) by mouth daily.    Dispense:  30 tablet    Refill:  3  . metFORMIN (GLUCOPHAGE) 1000 MG tablet    Sig: Take 1 tablet (1,000 mg total) by mouth 2 (two) times daily.    Dispense:  60 tablet    Refill:  3  . simvastatin (ZOCOR) 20 MG tablet    Sig: Take 1 tablet (20 mg total) by mouth daily at 6 PM.    Dispense:  30 tablet    Refill:  3    Follow-up: Return in about 1 month (around 09/30/2015) for Complete physical exam and Pap smear.   Arnoldo Morale MD

## 2015-08-30 NOTE — Telephone Encounter (Signed)
Patients syringes were ordered and sent to the [facility] pharmacy

## 2015-08-30 NOTE — Patient Instructions (Signed)
Diabetes Mellitus and Food It is important for you to manage your blood sugar (glucose) level. Your blood glucose level can be greatly affected by what you eat. Eating healthier foods in the appropriate amounts throughout the day at about the same time each day will help you control your blood glucose level. It can also help slow or prevent worsening of your diabetes mellitus. Healthy eating may even help you improve the level of your blood pressure and reach or maintain a healthy weight.  General recommendations for healthful eating and cooking habits include:  Eating meals and snacks regularly. Avoid going long periods of time without eating to lose weight.  Eating a diet that consists mainly of plant-based foods, such as fruits, vegetables, nuts, legumes, and whole grains.  Using low-heat cooking methods, such as baking, instead of high-heat cooking methods, such as deep frying. Work with your dietitian to make sure you understand how to use the Nutrition Facts information on food labels. HOW CAN FOOD AFFECT ME? Carbohydrates Carbohydrates affect your blood glucose level more than any other type of food. Your dietitian will help you determine how many carbohydrates to eat at each meal and teach you how to count carbohydrates. Counting carbohydrates is important to keep your blood glucose at a healthy level, especially if you are using insulin or taking certain medicines for diabetes mellitus. Alcohol Alcohol can cause sudden decreases in blood glucose (hypoglycemia), especially if you use insulin or take certain medicines for diabetes mellitus. Hypoglycemia can be a life-threatening condition. Symptoms of hypoglycemia (sleepiness, dizziness, and disorientation) are similar to symptoms of having too much alcohol.  If your health care provider has given you approval to drink alcohol, do so in moderation and use the following guidelines:  Women should not have more than one drink per day, and men  should not have more than two drinks per day. One drink is equal to:  12 oz of beer.  5 oz of wine.  1 oz of hard liquor.  Do not drink on an empty stomach.  Keep yourself hydrated. Have water, diet soda, or unsweetened iced tea.  Regular soda, juice, and other mixers might contain a lot of carbohydrates and should be counted. WHAT FOODS ARE NOT RECOMMENDED? As you make food choices, it is important to remember that all foods are not the same. Some foods have fewer nutrients per serving than other foods, even though they might have the same number of calories or carbohydrates. It is difficult to get your body what it needs when you eat foods with fewer nutrients. Examples of foods that you should avoid that are high in calories and carbohydrates but low in nutrients include:  Trans fats (most processed foods list trans fats on the Nutrition Facts label).  Regular soda.  Juice.  Candy.  Sweets, such as cake, pie, doughnuts, and cookies.  Fried foods. WHAT FOODS CAN I EAT? Eat nutrient-rich foods, which will nourish your body and keep you healthy. The food you should eat also will depend on several factors, including:  The calories you need.  The medicines you take.  Your weight.  Your blood glucose level.  Your blood pressure level.  Your cholesterol level. You should eat a variety of foods, including:  Protein.  Lean cuts of meat.  Proteins low in saturated fats, such as fish, egg whites, and beans. Avoid processed meats.  Fruits and vegetables.  Fruits and vegetables that may help control blood glucose levels, such as apples, mangoes, and   yams.  Dairy products.  Choose fat-free or low-fat dairy products, such as milk, yogurt, and cheese.  Grains, bread, pasta, and rice.  Choose whole grain products, such as multigrain bread, whole oats, and brown rice. These foods may help control blood pressure.  Fats.  Foods containing healthful fats, such as nuts,  avocado, olive oil, canola oil, and fish. DOES EVERYONE WITH DIABETES MELLITUS HAVE THE SAME MEAL PLAN? Because every person with diabetes mellitus is different, there is not one meal plan that works for everyone. It is very important that you meet with a dietitian who will help you create a meal plan that is just right for you.   This information is not intended to replace advice given to you by your health care provider. Make sure you discuss any questions you have with your health care provider.   Document Released: 10/25/2004 Document Revised: 02/18/2014 Document Reviewed: 12/25/2012 Elsevier Interactive Patient Education 2016 [employer]  

## 2015-09-04 ENCOUNTER — Ambulatory Visit: Payer: Self-pay

## 2015-09-06 ENCOUNTER — Other Ambulatory Visit: Payer: Self-pay

## 2015-09-22 ENCOUNTER — Other Ambulatory Visit: Payer: Self-pay

## 2015-10-06 ENCOUNTER — Ambulatory Visit: Payer: Self-pay | Attending: Family Medicine | Admitting: Family Medicine

## 2015-10-17 ENCOUNTER — Emergency Department (HOSPITAL_COMMUNITY)
Admission: EM | Admit: 2015-10-17 | Discharge: 2015-10-18 | Disposition: A | Discharge status: Home / Self Care | Payer: Self-pay | Attending: Emergency Medicine | Admitting: Emergency Medicine

## 2015-10-17 ENCOUNTER — Encounter (HOSPITAL_COMMUNITY): Payer: Self-pay | Admitting: *Deleted

## 2015-10-17 DIAGNOSIS — K8020 Calculus of gallbladder without cholecystitis without obstruction: Secondary | ICD-10-CM

## 2015-10-17 DIAGNOSIS — E1165 Type 2 diabetes mellitus with hyperglycemia: Secondary | ICD-10-CM | POA: Insufficient documentation

## 2015-10-17 DIAGNOSIS — Z87891 Personal history of nicotine dependence: Secondary | ICD-10-CM | POA: Insufficient documentation

## 2015-10-17 DIAGNOSIS — Z7984 Long term (current) use of oral hypoglycemic drugs: Secondary | ICD-10-CM | POA: Insufficient documentation

## 2015-10-17 DIAGNOSIS — I10 Essential (primary) hypertension: Secondary | ICD-10-CM | POA: Insufficient documentation

## 2015-10-17 DIAGNOSIS — Z794 Long term (current) use of insulin: Secondary | ICD-10-CM | POA: Insufficient documentation

## 2015-10-17 DIAGNOSIS — R109 Unspecified abdominal pain: Secondary | ICD-10-CM

## 2015-10-17 LAB — COMPREHENSIVE METABOLIC PANEL
ALT: 16 U/L (ref 14–54)
AST: 14 U/L — AB (ref 15–41)
Albumin: 4.4 g/dL (ref 3.5–5.0)
Alkaline Phosphatase: 53 U/L (ref 38–126)
Anion gap: 8 (ref 5–15)
BILIRUBIN TOTAL: 1 mg/dL (ref 0.3–1.2)
BUN: 12 mg/dL (ref 6–20)
CO2: 27 mmol/L (ref 22–32)
CREATININE: 0.71 mg/dL (ref 0.44–1.00)
Calcium: 10.1 mg/dL (ref 8.9–10.3)
Chloride: 105 mmol/L (ref 101–111)
Glucose, Bld: 169 mg/dL — ABNORMAL HIGH (ref 65–99)
POTASSIUM: 4.4 mmol/L (ref 3.5–5.1)
Sodium: 140 mmol/L (ref 135–145)
TOTAL PROTEIN: 7.5 g/dL (ref 6.5–8.1)

## 2015-10-17 LAB — CBC
HEMATOCRIT: 35.5 % — AB (ref 36.0–46.0)
Hemoglobin: 11.9 g/dL — ABNORMAL LOW (ref 12.0–15.0)
MCH: 29.2 pg (ref 26.0–34.0)
MCHC: 33.5 g/dL (ref 30.0–36.0)
MCV: 87.2 fL (ref 78.0–100.0)
PLATELETS: 353 10*3/uL (ref 150–400)
RBC: 4.07 MIL/uL (ref 3.87–5.11)
RDW: 13 % (ref 11.5–15.5)
WBC: 5.9 10*3/uL (ref 4.0–10.5)

## 2015-10-17 LAB — LIPASE, BLOOD: Lipase: 18 U/L (ref 11–51)

## 2015-10-17 MED ORDER — OXYCODONE-ACETAMINOPHEN 5-325 MG PO TABS
ORAL_TABLET | ORAL | Status: AC
  Filled 2015-10-17: qty 1

## 2015-10-17 MED ORDER — ONDANSETRON 4 MG PO TBDP
4.0000 mg | ORAL_TABLET | Freq: Once | ORAL | Status: AC | PRN
  Administered 2015-10-17: 4 mg via ORAL

## 2015-10-17 MED ORDER — OXYCODONE-ACETAMINOPHEN 5-325 MG PO TABS
1.0000 | ORAL_TABLET | Freq: Once | ORAL | Status: AC
  Administered 2015-10-17: 1 via ORAL

## 2015-10-17 MED ORDER — GI COCKTAIL ~~LOC~~
30.0000 mL | Freq: Once | ORAL | Status: AC
  Administered 2015-10-17: 30 mL via ORAL
  Filled 2015-10-17: qty 30

## 2015-10-17 MED ORDER — ONDANSETRON 4 MG PO TBDP
ORAL_TABLET | ORAL | Status: AC
  Filled 2015-10-17: qty 1

## 2015-10-17 NOTE — ED Notes (Signed)
Jamie PA-C at bedside.

## 2015-10-17 NOTE — ED Triage Notes (Signed)
Pt reports worsening N/V/abdominal pain over the last month. Pt has known gallstone and thinks that that is causing her pain.

## 2015-10-17 NOTE — ED Provider Notes (Signed)
[facility] DEPT Provider Note   CSN: [id number] Arrival date & time: 10/17/15  1810     History   Chief Complaint Chief Complaint  Patient presents with  . Abdominal Pain  . Nausea    HPI Lorraine Turner is a 55 y.o. female.  The history is provided by the patient and medical records. No language interpreter was used.   Lorraine Turner is a 55 y.o. female  with a PMH of schizophrenia, bipolar disorder, DM, HTN who presents to the Emergency Department complaining of worsening intermittent abdominal pain x 2 months. Pain feels like it starts on the right side of her abdomen and will occasionally radiate to the left side. Tylenol helped initially but has not helped in the last 3 days. She also tried [place] and Icy-Hot with no relief. Over the last three days, she endorses associated nausea and one episode of emesis each day. Denies fever, back pain, dysuria, vaginal itching, shortness of breath, chest pain. She states that she knows she has gallstones and feels like she is having a "gallstone attack".   Followed by [facility]. Has not seen GI specialist in the past.   Past Medical History:  Diagnosis Date  . Anxiety   . Bipolar 1 disorder ([place])   . Depression   . Diabetes mellitus   . Hypertension   . Schizophrenia [facility])     Patient Active Problem List   Diagnosis Date Noted  . Type 2 diabetes mellitus with hyperglycemia ([place])   . Diabetes mellitus ([place]) 05/10/2014  . Schizoaffective disorder, depressive type ([place]) 08/14/2011  . UNSPECIFIED ANEMIA 01/23/2009  . Essential hypertension 10/12/2008  . HYPERCHOLESTEROLEMIA 01/01/2007  . PANIC DISORDER 01/01/2007  . Obsessive compulsive disorder 01/01/2007    History reviewed. No pertinent surgical history.  OB History    No data available       Home Medications    Prior to Admission medications   Medication Sig Start Date End Date Taking? Authorizing Provider  Blood Glucose Monitoring Suppl  (TRUE METRIX METER) w/Device KIT 1 Device by Does not apply route 3 (three) times daily. 08/17/15   Argentina Donovan, PA-C  busPIRone (BUSPAR) 15 MG tablet Take 7.5 mg by mouth 2 (two) times daily. Reported on 05/31/2015    Historical Provider, MD  gabapentin (NEURONTIN) 300 MG capsule Take 1 capsule (300 mg total) by mouth 3 (three) times daily. 08/17/15   Argentina Donovan, PA-C  glipiZIDE (GLUCOTROL) 10 MG tablet Take 1 tablet (10 mg total) by mouth 2 (two) times daily before a meal. 08/30/15   Arnoldo Morale, MD  glucose blood (TRUE METRIX BLOOD GLUCOSE TEST) test strip Use as instructed 08/17/15   Argentina Donovan, PA-C  glucose blood (TRUE METRIX BLOOD GLUCOSE TEST) test strip Use as instructed 08/17/15   Argentina Donovan, PA-C  insulin glargine (LANTUS) 100 UNIT/ML injection Inject 0.15 mLs (15 Units total) into the skin at bedtime. 08/17/15   Argentina Donovan, PA-C  Insulin Syringe-Needle U-100 31G X 5/16" 0.3 ML MISC 1 each by Does not apply route 4 (four) times daily -  before meals and at bedtime. 08/30/15   Arnoldo Morale, MD  lisinopril (PRINIVIL,ZESTRIL) 20 MG tablet Take 1 tablet (20 mg total) by mouth daily. 08/30/15   Arnoldo Morale, MD  metFORMIN (GLUCOPHAGE) 1000 MG tablet Take 1 tablet (1,000 mg total) by mouth 2 (two) times daily. 08/30/15   Arnoldo Morale, MD  Multiple Vitamin (MULTIVITAMIN) tablet Take 1  tablet by mouth daily. Reported on 08/30/2015    Historical Provider, MD  naproxen sodium (ANAPROX) 220 MG tablet Take 220 mg by mouth at bedtime as needed (for pain).    Historical Provider, MD  ondansetron (ZOFRAN ODT) 4 MG disintegrating tablet Take 1 tablet (4 mg total) by mouth every 8 (eight) hours as needed for nausea or vomiting. 10/18/15   Jaime Pilcher Ward, PA-C  QUEtiapine (SEROQUEL) 50 MG tablet Take 3 tablets (150 mg total) by mouth at bedtime. For mood control/depression Patient taking differently: Take 50 mg by mouth at bedtime as needed (for mood control/depression). For mood  control/depression 05/11/14   Shuvon B Rankin, NP  sertraline (ZOLOFT) 100 MG tablet Take 1 tablet (100 mg total) by mouth daily. For depression 05/11/14   Shuvon B Rankin, NP  simvastatin (ZOCOR) 20 MG tablet Take 1 tablet (20 mg total) by mouth daily at 6 PM. 08/30/15   Arnoldo Morale, MD  traZODone (DESYREL) 50 MG tablet Take 25-50 mg by mouth at bedtime as needed for sleep.     Historical Provider, MD  TRUEPLUS LANCETS 28G MISC 1 each by Does not apply route 3 (three) times daily. 08/17/15   Argentina Donovan, PA-C    Family History History reviewed. No pertinent family history.  Social History Social History  Substance Use Topics  . Smoking status: Former Smoker    Types: Cigars    Quit date: 03/14/2013  . Smokeless tobacco: Never Used  . Alcohol use No     Comment: every weekend     Allergies   Metformin and related   Review of Systems Review of Systems  Constitutional: Negative for chills and fever.  HENT: Negative for congestion.   Eyes: Negative for visual disturbance.  Respiratory: Negative for cough and shortness of breath.   Cardiovascular: Negative.   Gastrointestinal: Positive for abdominal pain, nausea and vomiting. Negative for blood in stool and diarrhea.  Genitourinary: Negative for dysuria.  Musculoskeletal: Negative for back pain and neck pain.  Skin: Negative for rash.  Neurological: Negative for headaches.     Physical Exam Updated Vital Signs BP 128/96   Pulse 66   Temp 98.3 F (36.8 C) (Oral)   Resp 17   Ht '5\' 3"'  (1.6 m)   Wt 54.9 kg   SpO2 100%   BMI 21.43 kg/m   Physical Exam  Constitutional: She is oriented to person, place, and time. She appears well-developed and well-nourished. No distress.  HENT:  Head: Normocephalic and atraumatic.  Cardiovascular: Normal rate, regular rhythm and normal heart sounds.   Pulmonary/Chest: Effort normal and breath sounds normal. No respiratory distress.  Abdominal: Soft. Bowel sounds are normal. She  exhibits no distension.  Soft, non-distended. Diffuse tenderness to palpation. No rebound tenderness. Not peritoneal.   Musculoskeletal: Normal range of motion.  Neurological: She is alert and oriented to person, place, and time.  Skin: Skin is warm and dry.  Nursing note and vitals reviewed.    ED Treatments / Results  Labs (all labs ordered are listed, but only abnormal results are displayed) Labs Reviewed  COMPREHENSIVE METABOLIC PANEL - Abnormal; Notable for the following:       Result Value   Glucose, Bld 169 (*)    AST 14 (*)    All other components within normal limits  CBC - Abnormal; Notable for the following:    Hemoglobin 11.9 (*)    HCT 35.5 (*)    All other components within normal limits  URINALYSIS, ROUTINE W REFLEX MICROSCOPIC (NOT AT [facility]) - Abnormal; Notable for the following:    APPearance CLOUDY (*)    Bilirubin Urine SMALL (*)    Ketones, ur 15 (*)    Leukocytes, UA TRACE (*)    All other components within normal limits  URINE MICROSCOPIC-ADD ON - Abnormal; Notable for the following:    Squamous Epithelial / LPF 0-5 (*)    Bacteria, UA FEW (*)    All other components within normal limits  LIPASE, BLOOD    EKG  EKG Interpretation None       Radiology US Abdomen Complete  Result Date: 10/18/2015 CLINICAL DATA:  Chronic right-sided abdominal pain. Nausea, vomiting and diarrhea. Initial encounter. EXAM: ABDOMEN ULTRASOUND COMPLETE COMPARISON:  CT of the abdomen and pelvis performed 07/01/2015 FINDINGS: Gallbladder: Several large stones are seen within the gallbladder, measuring up to 1.5 cm in size. No gallbladder wall thickening or pericholecystic fluid is seen. However, a positive ultrasonographic Murphy's sign is elicited. Common bile duct: Diameter: 0.3 cm, within normal limits in caliber. Liver: No focal lesion identified. Within normal limits in parenchymal echogenicity. IVC: No abnormality visualized. Pancreas: Not visualized due to overlying  structures. Spleen: Size and appearance within normal limits. Right Kidney: Length: 9.8 cm. Echogenicity within normal limits. No mass or hydronephrosis visualized. Left Kidney: Length: 10.1 cm. Echogenicity within normal limits. No mass or hydronephrosis visualized. Abdominal aorta: No aneurysm visualized. Not characterized proximally due to overlying bowel gas. Other findings: None. IMPRESSION: Cholelithiasis noted, with a positive ultrasonographic Murphy's sign. This is of uncertain significance, as no gallbladder wall thickening or pericholecystic fluid is seen. Would follow the patient's symptoms, to exclude mild cholecystitis. Electronically Signed   By: Garald Balding M.D.   On: 10/18/2015 00:52    Procedures Procedures (including critical care time)  Medications Ordered in ED Medications  ondansetron (ZOFRAN-ODT) 4 MG disintegrating tablet (not administered)  oxyCODONE-acetaminophen (PERCOCET/ROXICET) 5-325 MG per tablet (not administered)  ondansetron (ZOFRAN-ODT) disintegrating tablet 4 mg (4 mg Oral Given 10/17/15 1843)  oxyCODONE-acetaminophen (PERCOCET/ROXICET) 5-325 MG per tablet 1 tablet (1 tablet Oral Given 10/17/15 1910)  gi cocktail (Maalox,Lidocaine,Donnatal) (30 mLs Oral Given 10/17/15 2353)     Initial Impression / Assessment and Plan / ED Course  I have reviewed the triage vital signs and the nursing notes.  Pertinent labs & imaging results that were available during my care of the patient were reviewed by me and considered in my medical decision making (see chart for details).  Clinical Course   ITZEL MCKIBBIN is a 55 y.o. female who presents to ED for right sided abdominal pain. On exam, she is non-toxic appearing, afebrile and hemodynamically stable. Abdomen with no peritoneal signs and generalized abdominal tenderness. Labs reviewed and reassuring. Ultrasound shows gallstones within the gallbladder- no obstructing stone, no wall thickening or fluid seen. Pain and nausea  controlled in ED and patient is tolerating PO while in ED. I believe patient is appropriately for outpatient follow up to discuss gallstones. Referral to [facility] given. Follow up care discussed with patient. Reasons to return to ED discussed and all questions answered.  Patient discussed with Dr. Dina Rich who agrees with treatment plan.   Final Clinical Impressions(s) / ED Diagnoses   Final diagnoses:  Right sided abdominal pain  Gallstones    New Prescriptions Discharge Medication List as of 10/18/2015  2:50 AM    START taking these medications   Details  ondansetron (ZOFRAN ODT) 4 MG disintegrating tablet  Take 1 tablet (4 mg total) by mouth every 8 (eight) hours as needed for nausea or vomiting., Starting Wed 10/18/2015, Print         [employer] Ward, PA-C 10/18/15 1848    Merryl Hacker, MD 10/18/15 [phone]

## 2015-10-18 ENCOUNTER — Emergency Department (HOSPITAL_COMMUNITY): Payer: Self-pay

## 2015-10-18 LAB — URINE MICROSCOPIC-ADD ON: RBC / HPF: NONE SEEN RBC/hpf (ref 0–5)

## 2015-10-18 LAB — URINALYSIS, ROUTINE W REFLEX MICROSCOPIC
GLUCOSE, UA: NEGATIVE mg/dL
Hgb urine dipstick: NEGATIVE
KETONES UR: 15 mg/dL — AB
NITRITE: NEGATIVE
PROTEIN: NEGATIVE mg/dL
Specific Gravity, Urine: 1.024 (ref 1.005–1.030)
pH: 6.5 (ref 5.0–8.0)

## 2015-10-18 MED ORDER — ONDANSETRON 4 MG PO TBDP: 4.0000 mg | ORAL_TABLET | Freq: Three times a day (TID) | ORAL | 0 refills | Status: DC | PRN

## 2015-10-18 NOTE — Discharge Instructions (Signed)
Call the surgery clinic listed in the morning to schedule a follow-up appointment in regards to today's visit. Tylenol as needed for pain.  Return to ER for fever, uncontrolled vomiting, new or worsening symptoms, any additional concerns.

## 2015-10-18 NOTE — ED Notes (Signed)
Pt given diet ginger ale and crackers with peanut butter

## 2015-10-18 NOTE — ED Notes (Signed)
Patient transported to Ultrasound 

## 2015-10-18 NOTE — ED Notes (Signed)
RN notified of patients successful PO challenge

## 2015-11-01 ENCOUNTER — Encounter (HOSPITAL_COMMUNITY): Payer: Self-pay | Admitting: Emergency Medicine

## 2015-11-01 ENCOUNTER — Emergency Department (HOSPITAL_COMMUNITY)
Admission: EM | Admit: 2015-11-01 | Discharge: 2015-11-02 | Disposition: A | Discharge status: Home / Self Care | Payer: Self-pay | Attending: Emergency Medicine | Admitting: Emergency Medicine

## 2015-11-01 DIAGNOSIS — Z7984 Long term (current) use of oral hypoglycemic drugs: Secondary | ICD-10-CM | POA: Insufficient documentation

## 2015-11-01 DIAGNOSIS — Z794 Long term (current) use of insulin: Secondary | ICD-10-CM | POA: Insufficient documentation

## 2015-11-01 DIAGNOSIS — F17210 Nicotine dependence, cigarettes, uncomplicated: Secondary | ICD-10-CM | POA: Insufficient documentation

## 2015-11-01 DIAGNOSIS — I10 Essential (primary) hypertension: Secondary | ICD-10-CM | POA: Insufficient documentation

## 2015-11-01 DIAGNOSIS — Z79899 Other long term (current) drug therapy: Secondary | ICD-10-CM | POA: Insufficient documentation

## 2015-11-01 DIAGNOSIS — F17290 Nicotine dependence, other tobacco product, uncomplicated: Secondary | ICD-10-CM | POA: Insufficient documentation

## 2015-11-01 DIAGNOSIS — K8050 Calculus of bile duct without cholangitis or cholecystitis without obstruction: Secondary | ICD-10-CM | POA: Insufficient documentation

## 2015-11-01 DIAGNOSIS — E119 Type 2 diabetes mellitus without complications: Secondary | ICD-10-CM | POA: Insufficient documentation

## 2015-11-01 HISTORY — DX: Calculus of gallbladder without cholecystitis without obstruction: K80.20

## 2015-11-01 LAB — HEPATIC FUNCTION PANEL
ALBUMIN: 4.6 g/dL (ref 3.5–5.0)
ALK PHOS: 61 U/L (ref 38–126)
ALT: 15 U/L (ref 14–54)
AST: 13 U/L — ABNORMAL LOW (ref 15–41)
BILIRUBIN INDIRECT: 0.6 mg/dL (ref 0.3–0.9)
BILIRUBIN TOTAL: 0.7 mg/dL (ref 0.3–1.2)
Bilirubin, Direct: 0.1 mg/dL (ref 0.1–0.5)
TOTAL PROTEIN: 7.8 g/dL (ref 6.5–8.1)

## 2015-11-01 LAB — CBC
HCT: 34.3 % — ABNORMAL LOW (ref 36.0–46.0)
HEMOGLOBIN: 12.1 g/dL (ref 12.0–15.0)
MCH: 30 pg (ref 26.0–34.0)
MCHC: 35.3 g/dL (ref 30.0–36.0)
MCV: 85.1 fL (ref 78.0–100.0)
PLATELETS: 326 10*3/uL (ref 150–400)
RBC: 4.03 MIL/uL (ref 3.87–5.11)
RDW: 13 % (ref 11.5–15.5)
WBC: 5.5 10*3/uL (ref 4.0–10.5)

## 2015-11-01 LAB — BASIC METABOLIC PANEL
ANION GAP: 7 (ref 5–15)
BUN: 13 mg/dL (ref 6–20)
CALCIUM: 10 mg/dL (ref 8.9–10.3)
CO2: 29 mmol/L (ref 22–32)
CREATININE: 0.62 mg/dL (ref 0.44–1.00)
Chloride: 102 mmol/L (ref 101–111)
Glucose, Bld: 260 mg/dL — ABNORMAL HIGH (ref 65–99)
Potassium: 4 mmol/L (ref 3.5–5.1)
SODIUM: 138 mmol/L (ref 135–145)

## 2015-11-01 LAB — LIPASE, BLOOD: LIPASE: 21 U/L (ref 11–51)

## 2015-11-01 LAB — CBG MONITORING, ED: GLUCOSE-CAPILLARY: 299 mg/dL — AB (ref 65–99)

## 2015-11-01 MED ORDER — HYDROCODONE-ACETAMINOPHEN 5-325 MG PO TABS
1.0000 | ORAL_TABLET | Freq: Once | ORAL | Status: AC
  Administered 2015-11-01: 1 via ORAL
  Filled 2015-11-01: qty 1

## 2015-11-01 NOTE — ED Provider Notes (Signed)
[facility] DEPT Provider Note   CSN: [id number] Arrival date & time: 11/01/15  1930     History   Chief Complaint Chief Complaint  Patient presents with  . Weakness  . Abdominal Pain    HPI Lorraine Turner is a 55 y.o. female.  HPI Patient poor she continues to get abdominal pain. She states she thinks is her gallstones. She has been trying home remedies of vinegar and raw apple cider to deal with the stones. She reports sometimes it asked he helps her pain. She reports that she is working and getting her [place] and getting in to see a Psychologist, sport and exercise and a family doctor but she hasn't been able to get any continuous care for her current medical problems. She reports that the pain waxes and wanes in severity. She did have vomiting once or twice today. No fever or chills. No cough shortness of breath. She reports over number of months she has lost a lot of weight. She has not quantify how much. Past Medical History:  Diagnosis Date  . Anxiety   . Bipolar 1 disorder ([place])   . Depression   . Diabetes mellitus   . Gallstones   . Hypertension   . Schizophrenia [facility])     Patient Active Problem List   Diagnosis Date Noted  . Type 2 diabetes mellitus with hyperglycemia ([place])   . Diabetes mellitus ([place]) 05/10/2014  . Schizoaffective disorder, depressive type ([place]) 08/14/2011  . UNSPECIFIED ANEMIA 01/23/2009  . Essential hypertension 10/12/2008  . HYPERCHOLESTEROLEMIA 01/01/2007  . PANIC DISORDER 01/01/2007  . Obsessive compulsive disorder 01/01/2007    History reviewed. No pertinent surgical history.  OB History    No data available       Home Medications    Prior to Admission medications   Medication Sig Start Date End Date Taking? Authorizing Provider  busPIRone (BUSPAR) 15 MG tablet Take 7.5 mg by mouth 2 (two) times daily. Reported on 05/31/2015   Yes Historical Provider, MD  gabapentin (NEURONTIN) 300 MG capsule Take 1 capsule (300 mg total) by mouth 3 (three)  times daily. 08/17/15  Yes Dionne Bucy McClung, PA-C  glipiZIDE (GLUCOTROL) 10 MG tablet Take 1 tablet (10 mg total) by mouth 2 (two) times daily before a meal. 08/30/15  Yes Arnoldo Morale, MD  insulin glargine (LANTUS) 100 UNIT/ML injection Inject 0.15 mLs (15 Units total) into the skin at bedtime. 08/17/15  Yes Dionne Bucy McClung, PA-C  lisinopril (PRINIVIL,ZESTRIL) 20 MG tablet Take 1 tablet (20 mg total) by mouth daily. 08/30/15  Yes Arnoldo Morale, MD  metFORMIN (GLUCOPHAGE) 1000 MG tablet Take 1 tablet (1,000 mg total) by mouth 2 (two) times daily. 08/30/15  Yes Arnoldo Morale, MD  Multiple Vitamin (MULTIVITAMIN) tablet Take 1 tablet by mouth daily. Reported on 08/30/2015   Yes Historical Provider, MD  naproxen sodium (ANAPROX) 220 MG tablet Take 220 mg by mouth at bedtime as needed (for pain).   Yes Historical Provider, MD  ondansetron (ZOFRAN ODT) 4 MG disintegrating tablet Take 1 tablet (4 mg total) by mouth every 8 (eight) hours as needed for nausea or vomiting. 10/18/15  Yes Jaime Pilcher Ward, PA-C  QUEtiapine (SEROQUEL) 50 MG tablet Take 3 tablets (150 mg total) by mouth at bedtime. For mood control/depression Patient taking differently: Take 50 mg by mouth at bedtime as needed (for mood control/depression). For mood control/depression 05/11/14  Yes Shuvon B Rankin, NP  simvastatin (ZOCOR) 20 MG tablet Take 1 tablet (20 mg total) by  mouth daily at 6 PM. 08/30/15  Yes Arnoldo Morale, MD  traZODone (DESYREL) 50 MG tablet Take 25-50 mg by mouth at bedtime as needed for sleep.    Yes Historical Provider, MD  Blood Glucose Monitoring Suppl (TRUE METRIX METER) w/Device KIT 1 Device by Does not apply route 3 (three) times daily. 08/17/15   Argentina Donovan, PA-C  glucose blood (TRUE METRIX BLOOD GLUCOSE TEST) test strip Use as instructed 08/17/15   Argentina Donovan, PA-C  glucose blood (TRUE METRIX BLOOD GLUCOSE TEST) test strip Use as instructed Patient not taking: Reported on 11/01/2015 08/17/15   Argentina Donovan, PA-C    HYDROcodone-acetaminophen (NORCO/VICODIN) 5-325 MG tablet Take 1-2 tablets by mouth every 4 (four) hours as needed for moderate pain or severe pain. 11/02/15   Charlesetta Shanks, MD  Insulin Syringe-Needle U-100 31G X 5/16" 0.3 ML MISC 1 each by Does not apply route 4 (four) times daily -  before meals and at bedtime. 08/30/15   Arnoldo Morale, MD  sertraline (ZOLOFT) 100 MG tablet Take 1 tablet (100 mg total) by mouth daily. For depression Patient not taking: Reported on 11/01/2015 05/11/14   Shuvon B Rankin, NP  TRUEPLUS LANCETS 28G MISC 1 each by Does not apply route 3 (three) times daily. 08/17/15   Argentina Donovan, PA-C    Family History No family history on file.  Social History Social History  Substance Use Topics  . Smoking status: Current Some Day Smoker    Types: Cigars, Cigarettes    Last attempt to quit: 03/14/2013  . Smokeless tobacco: Never Used     Comment: "once a month"  . Alcohol use No     Comment: every weekend     Allergies   Metformin and related   Review of Systems Review of Systems  10 Systems reviewed and are negative for acute change except as noted in the HPI. Physical Exam Updated Vital Signs BP 176/82 (BP Location: Left Arm)   Pulse 78   Temp 98.1 F (36.7 C) (Oral)   Resp 18   Ht '5\' 3"'  (1.6 m)   Wt 127 lb (57.6 kg)   SpO2 100%   BMI 22.50 kg/m   Physical Exam  Constitutional: She appears well-developed and well-nourished. No distress.  HENT:  Head: Normocephalic and atraumatic.  Eyes: Conjunctivae are normal.  Neck: Neck supple.  Cardiovascular: Normal rate and regular rhythm.   No murmur heard. Pulmonary/Chest: Effort normal and breath sounds normal. No respiratory distress.  Abdominal: Soft. There is tenderness.  Right upper quadrant tender to palpation. No guarding or rebound.  Musculoskeletal: She exhibits no edema.  Neurological: She is alert.  Skin: Skin is warm and dry.  Psychiatric: She has a normal mood and affect.  Nursing note  and vitals reviewed.    ED Treatments / Results  Labs (all labs ordered are listed, but only abnormal results are displayed) Labs Reviewed  BASIC METABOLIC PANEL - Abnormal; Notable for the following:       Result Value   Glucose, Bld 260 (*)    All other components within normal limits  CBC - Abnormal; Notable for the following:    HCT 34.3 (*)    All other components within normal limits  HEPATIC FUNCTION PANEL - Abnormal; Notable for the following:    AST 13 (*)    All other components within normal limits  CBG MONITORING, ED - Abnormal; Notable for the following:    Glucose-Capillary 299 (*)  All other components within normal limits  LIPASE, BLOOD  URINALYSIS, ROUTINE W REFLEX MICROSCOPIC (NOT AT [facility])    EKG  EKG Interpretation None       Radiology No results found.  Procedures Procedures (including critical care time)  Medications Ordered in ED Medications  HYDROcodone-acetaminophen (NORCO/VICODIN) 5-325 MG per tablet 1 tablet (1 tablet Oral Given 11/01/15 2338)     Initial Impression / Assessment and Plan / ED Course  I have reviewed the triage vital signs and the nursing notes.  Pertinent labs & imaging results that were available during my care of the patient were reviewed by me and considered in my medical decision making (see chart for details).  Clinical Course    Final Clinical Impressions(s) / ED Diagnoses   Final diagnoses:  Biliary colic  Patient does have gallstones. She is experiencing right upper quadrant pain that waxes and wanes in severity. She is getting assistance to follow-up with her primary provider. Patient is counseled that she needs to see general surgery for definitive management. At this time she does not appear to have obstruction or pancreatitis.  New Prescriptions New Prescriptions   HYDROCODONE-ACETAMINOPHEN (NORCO/VICODIN) 5-325 MG TABLET    Take 1-2 tablets by mouth every 4 (four) hours as needed for moderate pain or  severe pain.     Charlesetta Shanks, MD 11/02/15 [phone]

## 2015-11-01 NOTE — ED Triage Notes (Signed)
Pt comes from home via EMS with complaints of generalized weakness and chronic abdominal pain from gall stones.  Pt is a type I diabetic and has not been eating or drinking anything but apple juice and vinegar for her gallstones.  Has not been taking insulin as well.  CBG in route was 365.  Received 200 cc of NS in route. BP 146/87, HR 84, 99% RA.

## 2015-11-02 LAB — URINALYSIS, ROUTINE W REFLEX MICROSCOPIC
Hgb urine dipstick: NEGATIVE
Ketones, ur: NEGATIVE mg/dL
Nitrite: NEGATIVE
PH: 6 (ref 5.0–8.0)
Protein, ur: NEGATIVE mg/dL
SPECIFIC GRAVITY, URINE: 1.025 (ref 1.005–1.030)

## 2015-11-02 LAB — URINE MICROSCOPIC-ADD ON: RBC / HPF: NONE SEEN RBC/hpf (ref 0–5)

## 2015-11-02 MED ORDER — HYDROCODONE-ACETAMINOPHEN 5-325 MG PO TABS: 1.0000 | ORAL_TABLET | ORAL | 0 refills | Status: DC | PRN

## 2015-11-15 ENCOUNTER — Encounter (HOSPITAL_COMMUNITY): Payer: Self-pay | Admitting: *Deleted

## 2015-11-15 ENCOUNTER — Emergency Department (HOSPITAL_COMMUNITY): Payer: Self-pay

## 2015-11-15 ENCOUNTER — Encounter: Payer: Self-pay | Admitting: Family Medicine

## 2015-11-15 ENCOUNTER — Ambulatory Visit: Payer: Self-pay | Attending: Family Medicine | Admitting: Family Medicine

## 2015-11-15 ENCOUNTER — Emergency Department (HOSPITAL_COMMUNITY)
Admission: EM | Admit: 2015-11-15 | Discharge: 2015-11-16 | Disposition: A | Discharge status: Home / Self Care | Payer: Self-pay | Attending: Emergency Medicine | Admitting: Emergency Medicine

## 2015-11-15 VITALS — BP 179/93 | HR 107 | Temp 98.4°F | Ht 62.0 in | Wt 108.8 lb

## 2015-11-15 DIAGNOSIS — Z7984 Long term (current) use of oral hypoglycemic drugs: Secondary | ICD-10-CM | POA: Insufficient documentation

## 2015-11-15 DIAGNOSIS — R1011 Right upper quadrant pain: Secondary | ICD-10-CM | POA: Insufficient documentation

## 2015-11-15 DIAGNOSIS — R110 Nausea: Secondary | ICD-10-CM | POA: Insufficient documentation

## 2015-11-15 DIAGNOSIS — Z794 Long term (current) use of insulin: Secondary | ICD-10-CM | POA: Insufficient documentation

## 2015-11-15 DIAGNOSIS — E870 Hyperosmolality and hypernatremia: Secondary | ICD-10-CM | POA: Insufficient documentation

## 2015-11-15 DIAGNOSIS — I10 Essential (primary) hypertension: Secondary | ICD-10-CM | POA: Insufficient documentation

## 2015-11-15 DIAGNOSIS — E0800 Diabetes mellitus due to underlying condition with hyperosmolarity without nonketotic hyperglycemic-hyperosmolar coma (NKHHC): Secondary | ICD-10-CM

## 2015-11-15 DIAGNOSIS — Z79899 Other long term (current) drug therapy: Secondary | ICD-10-CM | POA: Insufficient documentation

## 2015-11-15 DIAGNOSIS — F259 Schizoaffective disorder, unspecified: Secondary | ICD-10-CM | POA: Insufficient documentation

## 2015-11-15 DIAGNOSIS — E7800 Pure hypercholesterolemia, unspecified: Secondary | ICD-10-CM | POA: Insufficient documentation

## 2015-11-15 DIAGNOSIS — E119 Type 2 diabetes mellitus without complications: Secondary | ICD-10-CM | POA: Insufficient documentation

## 2015-11-15 DIAGNOSIS — K8020 Calculus of gallbladder without cholecystitis without obstruction: Secondary | ICD-10-CM | POA: Insufficient documentation

## 2015-11-15 DIAGNOSIS — F17210 Nicotine dependence, cigarettes, uncomplicated: Secondary | ICD-10-CM | POA: Insufficient documentation

## 2015-11-15 LAB — COMPREHENSIVE METABOLIC PANEL
ALK PHOS: 58 U/L (ref 38–126)
ALT: 21 U/L (ref 14–54)
ANION GAP: 9 (ref 5–15)
AST: 16 U/L (ref 15–41)
Albumin: 4.3 g/dL (ref 3.5–5.0)
BILIRUBIN TOTAL: 1.3 mg/dL — AB (ref 0.3–1.2)
BUN: 7 mg/dL (ref 6–20)
CO2: 27 mmol/L (ref 22–32)
CREATININE: 0.67 mg/dL (ref 0.44–1.00)
Calcium: 9.9 mg/dL (ref 8.9–10.3)
Chloride: 102 mmol/L (ref 101–111)
GLUCOSE: 245 mg/dL — AB (ref 65–99)
Potassium: 3.8 mmol/L (ref 3.5–5.1)
SODIUM: 138 mmol/L (ref 135–145)
Total Protein: 7.6 g/dL (ref 6.5–8.1)

## 2015-11-15 LAB — CBC
HEMATOCRIT: 36.2 % (ref 36.0–46.0)
Hemoglobin: 12.4 g/dL (ref 12.0–15.0)
MCH: 30.1 pg (ref 26.0–34.0)
MCHC: 34.3 g/dL (ref 30.0–36.0)
MCV: 87.9 fL (ref 78.0–100.0)
PLATELETS: 262 10*3/uL (ref 150–400)
RBC: 4.12 MIL/uL (ref 3.87–5.11)
RDW: 12.8 % (ref 11.5–15.5)
WBC: 5.1 10*3/uL (ref 4.0–10.5)

## 2015-11-15 LAB — URINALYSIS, ROUTINE W REFLEX MICROSCOPIC
Bilirubin Urine: NEGATIVE
GLUCOSE, UA: 500 mg/dL — AB
HGB URINE DIPSTICK: NEGATIVE
KETONES UR: NEGATIVE mg/dL
LEUKOCYTES UA: NEGATIVE
Nitrite: NEGATIVE
PH: 6 (ref 5.0–8.0)
Protein, ur: NEGATIVE mg/dL
Specific Gravity, Urine: 1.019 (ref 1.005–1.030)

## 2015-11-15 LAB — I-STAT BETA HCG BLOOD, ED (MC, WL, AP ONLY)

## 2015-11-15 LAB — LIPASE, BLOOD: Lipase: 20 U/L (ref 11–51)

## 2015-11-15 LAB — I-STAT CG4 LACTIC ACID, ED: LACTIC ACID, VENOUS: 1.18 mmol/L (ref 0.5–1.9)

## 2015-11-15 MED ORDER — TRAMADOL HCL 50 MG PO TABS: 50.0000 mg | ORAL_TABLET | Freq: Three times a day (TID) | ORAL | 0 refills | Status: DC | PRN

## 2015-11-15 MED ORDER — IOPAMIDOL (ISOVUE-300) INJECTION 61%
INTRAVENOUS | Status: AC
  Administered 2015-11-15: 100 mL
  Filled 2015-11-15: qty 100

## 2015-11-15 MED ORDER — SIMVASTATIN 20 MG PO TABS: 20.0000 mg | ORAL_TABLET | Freq: Every day | ORAL | 3 refills | Status: DC

## 2015-11-15 MED ORDER — GLIPIZIDE 10 MG PO TABS: 10.0000 mg | ORAL_TABLET | Freq: Two times a day (BID) | ORAL | 3 refills | Status: DC

## 2015-11-15 MED ORDER — LISINOPRIL 20 MG PO TABS: 20.0000 mg | ORAL_TABLET | Freq: Every day | ORAL | 3 refills | Status: DC

## 2015-11-15 MED ORDER — METFORMIN HCL 1000 MG PO TABS: 1000.0000 mg | ORAL_TABLET | Freq: Two times a day (BID) | ORAL | 3 refills | Status: DC

## 2015-11-15 MED ORDER — INSULIN GLARGINE 100 UNIT/ML ~~LOC~~ SOLN: 15.0000 [IU] | Freq: Every day | SUBCUTANEOUS | 11 refills | Status: DC

## 2015-11-15 MED ORDER — MORPHINE SULFATE (PF) 4 MG/ML IV SOLN
4.0000 mg | Freq: Once | INTRAVENOUS | Status: AC
Start: 2015-11-15 — End: 2015-11-15
  Administered 2015-11-15: 4 mg via INTRAVENOUS
  Filled 2015-11-15: qty 1

## 2015-11-15 MED ORDER — ONDANSETRON 4 MG PO TBDP
4.0000 mg | ORAL_TABLET | Freq: Three times a day (TID) | ORAL | 0 refills | Status: AC | PRN
Start: 2015-11-15 — End: 2015-11-19

## 2015-11-15 MED ORDER — SODIUM CHLORIDE 0.9 % IV BOLUS (SEPSIS)
1000.0000 mL | Freq: Once | INTRAVENOUS | Status: AC
  Administered 2015-11-15: 1000 mL via INTRAVENOUS

## 2015-11-15 MED ORDER — ONDANSETRON HCL 4 MG/2ML IJ SOLN
4.0000 mg | Freq: Once | INTRAMUSCULAR | Status: AC
  Administered 2015-11-15: 4 mg via INTRAVENOUS
  Filled 2015-11-15: qty 2

## 2015-11-15 MED FILL — metFORMIN HCL 1000 MG TABS: 1000 | 30 days supply | Qty: 60 | Fill #0

## 2015-11-15 MED FILL — glipiZIDE 10 MG TABS: 10 | 30 days supply | Qty: 60 | Fill #0

## 2015-11-15 MED FILL — SIMVASTATIN 20 MG TABLET: 20 | 30 days supply | Qty: 30 | Fill #0

## 2015-11-15 MED FILL — ?LISINOPRIL 20 MG TABLET: 20 | 30 days supply | Qty: 30 | Fill #0

## 2015-11-15 NOTE — ED Provider Notes (Signed)
[facility] DEPT Provider Note  CSN: [id number] Arrival Date & Time: 11/15/15 @ 1633  History    Chief Complaint Chief Complaint  Patient presents with  . Abdominal Pain    HPI Lorraine Turner is a 55 y.o. female.  Patient presents emergency part for assessment of abdominal pain that is been present for the past 8 weeks. Patient endorses it is right upper quadrant and epigastric in nature. Endorses no chest pain no shortness of breath endorses no fevers and no chills. No urinary symptoms at this time. No vaginal bleeding or discharge. No emesis or nausea or diarrhea. Passing flatus today and having bowel movements but decreased for several weeks. Patient endorses that she has known gallbladder stones and states that she was evaluated by surgery clinic after being referred from the emergency department and she states that she is unable to have surgery due to absence of insurance.  Patient endorses that her medication prescription of hydrocodone ran out yesterday therefore the pain became unbearable therefore she presents today for reassessment.  Past Medical & Surgical History    Past Medical History:  Diagnosis Date  . Anxiety   . Bipolar 1 disorder ([place])   . Depression   . Diabetes mellitus   . Gallstones   . Hypertension   . Schizophrenia [facility])    Patient Active Problem List   Diagnosis Date Noted  . Type 2 diabetes mellitus with hyperglycemia ([place])   . Diabetes mellitus ([place]) 05/10/2014  . Schizoaffective disorder, depressive type ([place]) 08/14/2011  . UNSPECIFIED ANEMIA 01/23/2009  . Essential hypertension 10/12/2008  . HYPERCHOLESTEROLEMIA 01/01/2007  . PANIC DISORDER 01/01/2007  . Obsessive compulsive disorder 01/01/2007   History reviewed. No pertinent surgical history.  Family & Social History    History reviewed. No pertinent family history. Social History  Substance Use Topics  . Smoking status: Current Some Day Smoker    Types: Cigarettes    Last  attempt to quit: 03/14/2013  . Smokeless tobacco: Never Used     Comment: "once a month"  . Alcohol use No     Comment: every weekend    Home Medications    Prior to Admission medications   Medication Sig Start Date End Date Taking? Authorizing Provider  Blood Glucose Monitoring Suppl (TRUE METRIX METER) w/Device KIT 1 Device by Does not apply route 3 (three) times daily. Patient not taking: Reported on 11/15/2015 08/17/15   Argentina Donovan, PA-C  busPIRone (BUSPAR) 15 MG tablet Take 7.5 mg by mouth 2 (two) times daily. Reported on 05/31/2015    Historical Provider, MD  gabapentin (NEURONTIN) 300 MG capsule Take 1 capsule (300 mg total) by mouth 3 (three) times daily. Patient not taking: Reported on 11/15/2015 08/17/15   Argentina Donovan, PA-C  glipiZIDE (GLUCOTROL) 10 MG tablet Take 1 tablet (10 mg total) by mouth 2 (two) times daily before a meal. 11/15/15   Arnoldo Morale, MD  glucose blood (TRUE METRIX BLOOD GLUCOSE TEST) test strip Use as instructed Patient not taking: Reported on 11/15/2015 08/17/15   Argentina Donovan, PA-C  glucose blood (TRUE METRIX BLOOD GLUCOSE TEST) test strip Use as instructed Patient not taking: Reported on 11/15/2015 08/17/15   Argentina Donovan, PA-C  HYDROcodone-acetaminophen (NORCO/VICODIN) 5-325 MG tablet Take 1-2 tablets by mouth every 4 (four) hours as needed for moderate pain or severe pain. Patient not taking: Reported on 11/15/2015 11/02/15   Charlesetta Shanks, MD  insulin glargine (LANTUS) 100 UNIT/ML injection Inject 0.15 mLs (15 Units  total) into the skin at bedtime. 11/15/15   Arnoldo Morale, MD  Insulin Syringe-Needle U-100 31G X 5/16" 0.3 ML MISC 1 each by Does not apply route 4 (four) times daily -  before meals and at bedtime. Patient not taking: Reported on 11/15/2015 08/30/15   Arnoldo Morale, MD  lisinopril (PRINIVIL,ZESTRIL) 20 MG tablet Take 1 tablet (20 mg total) by mouth daily. 11/15/15   Arnoldo Morale, MD  metFORMIN (GLUCOPHAGE) 1000 MG tablet Take 1 tablet (1,000  mg total) by mouth 2 (two) times daily. 11/15/15   Arnoldo Morale, MD  Multiple Vitamin (MULTIVITAMIN) tablet Take 1 tablet by mouth daily. Reported on 08/30/2015    Historical Provider, MD  naproxen sodium (ANAPROX) 220 MG tablet Take 220 mg by mouth at bedtime as needed (for pain).    Historical Provider, MD  ondansetron (ZOFRAN ODT) 4 MG disintegrating tablet Take 1 tablet (4 mg total) by mouth every 8 (eight) hours as needed for nausea or vomiting. Patient not taking: Reported on 11/15/2015 10/18/15   [facility], PA-C  QUEtiapine (SEROQUEL) 50 MG tablet Take 3 tablets (150 mg total) by mouth at bedtime. For mood control/depression Patient not taking: Reported on 11/15/2015 05/11/14   Shuvon B Rankin, NP  sertraline (ZOLOFT) 100 MG tablet Take 1 tablet (100 mg total) by mouth daily. For depression Patient not taking: Reported on 11/15/2015 05/11/14   Shuvon B Rankin, NP  simvastatin (ZOCOR) 20 MG tablet Take 1 tablet (20 mg total) by mouth daily at 6 PM. 11/15/15   Arnoldo Morale, MD  traMADol (ULTRAM) 50 MG tablet Take 1 tablet (50 mg total) by mouth every 8 (eight) hours as needed. 11/15/15   Arnoldo Morale, MD  traZODone (DESYREL) 50 MG tablet Take 25-50 mg by mouth at bedtime as needed for sleep.     Historical Provider, MD  TRUEPLUS LANCETS 28G MISC 1 each by Does not apply route 3 (three) times daily. Patient not taking: Reported on 11/15/2015 08/17/15   Argentina Donovan, PA-C    Allergies    Metformin and related  I reviewed & agree with nursing's documentation on the patient's past medical, surgical, social & family histories as well as their allergies.  Review of Systems  Complete ROS obtained, and is negative except as stated in HPI.  Physical Exam  Updated Vital Signs BP 150/80 (BP Location: Right Arm)   Pulse 89   Temp 98.6 F (37 C) (Oral)   Resp 18   SpO2 100%  I have reviewed the triage vital signs and the nursing notes. Physical Exam CONST: Patient alert, well appearing,  well hydrated, in mild to moderate distress.  EYES: PERRLA. EOMI. Conjunctiva w/o d/c. Lids AT w/o swelling.  ENMT: External Nares & Ears AT w/o swelling. Oropharynx patent. MM moist.  NECK: ROM full w/o rigidity. Trachea midline. JVD absent.  CVS: +S1/S2 w/o obvious murmur. Lower extremities w/o pitting edema.  RESP: Respiratory effort unlabored w/o retractions & accessory muscle use. BS clear bilaterally.  GI: Soft & ND. +BS x 4. TTP present in RUQ, Murphys Sign present. Hernia absent. Guarding & Rebound absent.  BACK: CVA TTP absent bilaterally.  SKIN: Skin warm & dry. Turgor good. No rash.  PSYCH: Alert. Oriented. Affect and mood appropriate.  NEURO: CN II-XII grossly intact. Motor exam symmetric w/ upper & lower extremities 5/5 bilaterally. Sensation grossly intact.  MSK: Joints located & stable, w/o obvious dislocation & obvious deformity or crepitus absent w/ Cap refill < 2 sec. Peripheral  pulses 2+ & equal in all extremities.   ED Treatments & Results   Labs (only abnormal results are displayed) Labs Reviewed  COMPREHENSIVE METABOLIC PANEL - Abnormal; Notable for the following:       Result Value   Glucose, Bld 245 (*)    Total Bilirubin 1.3 (*)    All other components within normal limits  URINALYSIS, ROUTINE W REFLEX MICROSCOPIC (NOT AT [facility]) - Abnormal; Notable for the following:    Glucose, UA 500 (*)    All other components within normal limits  LIPASE, BLOOD  CBC    EKG    EKG Interpretation  Date/Time:    Ventricular Rate:    PR Interval:    QRS Duration:   QT Interval:    QTC Calculation:   R Axis:     Text Interpretation:        Radiology No results found.  Pertinent labs & imaging results that were available during my care of the patient were independently visualized by me and considered in my medical decision making, please see chart for details.  Procedures (including critical care time) Procedures  Medications Ordered in ED Medications - No  data to display  Initial Impression & Plan / ED Course & Results / Final Disposition   Initial Impression & Plan Patient presents emergency department for assessment of right upper quadrant abdominal pain in setting of known cholelithiasis.  Per review of patient's chart I reviewed ultrasound abdomen complete obtained on 10/18/2015 that shows no common bile duct abnormalities no concerning findings per liver however gallbladder shows several large stones approximately 1.5 cm in size. But no evidence of acute cholecystitis present. Per documentation of radiologist Percell Miller sign was elicited at that time.  Patients abdominal exam is benign and w/o concern for peritonitis. No painful masses, no CVA tenderness & overall was not consistent w/ surgical abdomen.  No evidence to support or suggest Ovarian or Gynecologic source for abdominal pain. Pregnancy test negative. Do not believe Ovarian Torsion, Ectopic Pregnancy or serious infection.  I obtained screening labs, which upon revealed no acute concerns as lactic acid, Lipase, and CBC unremarkable. CMP w/o LFT abnormalities and only abnormality was slightly elevated BG at 245 and T bili at 1.3. Imaging independently visualized & I appreciate no acute concerns of the abdomen or pelvis to explain pain at this time. Urine reveals no concerns for infection.  Patient was provided analgesia and IVF w/ antiemetics, which resulted in resolution of symptoms. Patient tolerating PO intake without development of abdominal pain or emesis.  Per their H&P, I have considered SBO, Mesenteric Ischemia, Appendicitis, Hepatitis, Cholecystitis, Pancreatitis, Diverticulitis, Perforated Viscous & Volvulus. ED course not consistent w/ these emergent etiologies.  Final Disposition Reassessment of the patient reveals serial abdominal exams remain benign.  Patient will require referral back to PCP for symptomatic treatment and consideration of HIDA scan and subsequent referral  for repeat Surgical evaluation.   ED Course in its entirety, care plan & clinical impressions w/ associated risks were reviewed w/ the patient. In light of the patient's reassuring evaluation above, I consider discharge disposition reasonable. They are in agreement. I gave my typical, strict return precautions in simple, non-technical language. We also discussed symptoms that are most concerning & would necessitate emergent return. I explicitly told them to immediately return to the ED if new symptoms develop, if worse, or for ANY concern. Treatments & follow up plan agreed upon & I confirmed all concerns & questions were addressed prior to  discharge.  Follow Up Arnoldo Morale, MD [place] [id number] [phone]  Schedule an appointment as soon as possible for a visit in 2 days For symptomatic reassessment  [place] [address] [id number] [place] [phone] Go to  [place] IF WORSE  New Prescriptions New Prescriptions   No medications on file    Final Clinical Impression & ED Diagnoses  No diagnosis found. Patient care discussed with the attending physician, Dr. Dayna Barker, who oversaw their evaluation & treatment & voiced agreement.  Note: This document was prepared using Dragon voice recognition software and may include unintentional dictation errors.  House Officer: Voncille Lo, MD, Emergency Medicine Resident.   Voncille Lo, MD 11/15/15 [place], MD 11/15/15 [phone]    Merrily Pew, MD 11/15/15 [phone]

## 2015-11-15 NOTE — Progress Notes (Signed)
No BM for 3 weeks- "just one really tiny one" Completely out of "all medications" Crying in "severe pain"

## 2015-11-15 NOTE — ED Triage Notes (Signed)
Pt reports having chronic abd pain that is related to gallstones and needs to have gallbladder removed. Has been referred to [place] but she states they were unable to help her. Reports abd pain and weight loss, fatigue.  denies n/v/d.

## 2015-11-15 NOTE — Progress Notes (Signed)
Subjective:  Patient ID: Lorraine Turner, female    DOB: 1960/07/21  Age: 55 y.o. MRN: [id number]  CC: Diabetes; Cholelithiasis; and Hypertension   HPI Lorraine Turner is a 55 year old female with history of type 2 diabetes mellitus (A1c 12.1), hypertension, schizoaffective disorder, hyperlipidemia, cholelithiasis who comes into the clinic in severe pain.  She is crying and complaining of diffuse abdominal pain worse in the right upper quadrant and reports going to [place] surgery and was informed she would be unable to have surgery due to lack of insurance. Pain has been ongoing for the last 6 weeks but have worsened of recent. Has also had anorexia, chills, 13 pound weight loss since her last visit two and a half months ago. Her last visits [facility] she received Percocet which she states is not helping; abdominal ultrasound from 10/18/15 revealed cholelithiasis.  She is also off of her chronic medications and is requesting refills.  Has been unable to go to [facility] where she receives her mental care due to abdominal symptoms.  Past Medical History:  Diagnosis Date  . Anxiety   . Bipolar 1 disorder ([place])   . Depression   . Diabetes mellitus   . Gallstones   . Hypertension   . Schizophrenia ([place])     History reviewed. No pertinent surgical history.   Allergies  Allergen Reactions  . Metformin And Related Nausea Only    Makes patient feel bad     Outpatient Medications Prior to Visit  Medication Sig Dispense Refill  . Blood Glucose Monitoring Suppl (TRUE METRIX METER) w/Device KIT 1 Device by Does not apply route 3 (three) times daily. (Patient not taking: Reported on 11/15/2015) 1 kit 0  . busPIRone (BUSPAR) 15 MG tablet Take 7.5 mg by mouth 2 (two) times daily. Reported on 05/31/2015    . gabapentin (NEURONTIN) 300 MG capsule Take 1 capsule (300 mg total) by mouth 3 (three) times daily. (Patient not taking: Reported on 11/15/2015) 90 capsule 5  . glucose  blood (TRUE METRIX BLOOD GLUCOSE TEST) test strip Use as instructed (Patient not taking: Reported on 11/15/2015) 100 each 12  . glucose blood (TRUE METRIX BLOOD GLUCOSE TEST) test strip Use as instructed (Patient not taking: Reported on 11/15/2015) 100 each 12  . HYDROcodone-acetaminophen (NORCO/VICODIN) 5-325 MG tablet Take 1-2 tablets by mouth every 4 (four) hours as needed for moderate pain or severe pain. (Patient not taking: Reported on 11/15/2015) 12 tablet 0  . Insulin Syringe-Needle U-100 31G X 5/16" 0.3 ML MISC 1 each by Does not apply route 4 (four) times daily -  before meals and at bedtime. (Patient not taking: Reported on 11/15/2015) 100 each 12  . Multiple Vitamin (MULTIVITAMIN) tablet Take 1 tablet by mouth daily. Reported on 08/30/2015    . naproxen sodium (ANAPROX) 220 MG tablet Take 220 mg by mouth at bedtime as needed (for pain).    . ondansetron (ZOFRAN ODT) 4 MG disintegrating tablet Take 1 tablet (4 mg total) by mouth every 8 (eight) hours as needed for nausea or vomiting. (Patient not taking: Reported on 11/15/2015) 10 tablet 0  . QUEtiapine (SEROQUEL) 50 MG tablet Take 3 tablets (150 mg total) by mouth at bedtime. For mood control/depression (Patient not taking: Reported on 11/15/2015) 90 tablet 0  . sertraline (ZOLOFT) 100 MG tablet Take 1 tablet (100 mg total) by mouth daily. For depression (Patient not taking: Reported on 11/15/2015) 30 tablet 0  . traZODone (DESYREL) 50 MG tablet Take  25-50 mg by mouth at bedtime as needed for sleep.     . TRUEPLUS LANCETS 28G MISC 1 each by Does not apply route 3 (three) times daily. (Patient not taking: Reported on 11/15/2015) 100 each 12  . glipiZIDE (GLUCOTROL) 10 MG tablet Take 1 tablet (10 mg total) by mouth 2 (two) times daily before a meal. (Patient not taking: Reported on 11/15/2015) 60 tablet 3  . insulin glargine (LANTUS) 100 UNIT/ML injection Inject 0.15 mLs (15 Units total) into the skin at bedtime. (Patient not taking: Reported on  11/15/2015) 10 mL 11  . lisinopril (PRINIVIL,ZESTRIL) 20 MG tablet Take 1 tablet (20 mg total) by mouth daily. (Patient not taking: Reported on 11/15/2015) 30 tablet 3  . metFORMIN (GLUCOPHAGE) 1000 MG tablet Take 1 tablet (1,000 mg total) by mouth 2 (two) times daily. (Patient not taking: Reported on 11/15/2015) 60 tablet 3  . simvastatin (ZOCOR) 20 MG tablet Take 1 tablet (20 mg total) by mouth daily at 6 PM. (Patient not taking: Reported on 11/15/2015) 30 tablet 3   No facility-administered medications prior to visit.     ROS Review of Systems  Constitutional: Positive for appetite change, chills and unexpected weight change. Negative for activity change and fatigue.  HENT: Negative for congestion, sinus pressure and sore throat.   Eyes: Negative for visual disturbance.  Respiratory: Negative for cough, chest tightness, shortness of breath and wheezing.   Cardiovascular: Negative for chest pain and palpitations.  Gastrointestinal: Positive for abdominal pain, constipation and nausea. Negative for abdominal distention.  Endocrine: Negative for polydipsia.  Genitourinary: Negative for dysuria and frequency.  Musculoskeletal: Negative for arthralgias and back pain.  Skin: Negative for rash.  Neurological: Negative for tremors, light-headedness and numbness.  Hematological: Does not bruise/bleed easily.  Psychiatric/Behavioral: Negative for agitation and behavioral problems.    Objective:  BP (!) 179/93 (BP Location: Right Arm, Patient Position: Sitting, Cuff Size: Small)   Pulse (!) 107   Temp 98.4 F (36.9 C) (Oral)   Ht '5\' 2"'$  (1.575 m)   Wt 108 lb 12.8 oz (49.4 kg)   SpO2 100%   BMI 19.90 kg/m   BP/Weight 11/15/2015 05/20/8117 02/15/7827  Systolic BP 562 130 865  Diastolic BP 93 82 96  Wt. (Lbs) 108.8 127 -  BMI 19.9 22.5 -  Some encounter information is confidential and restricted. Go to Review Flowsheets activity to see all data.      Physical Exam  Constitutional: She is  oriented to person, place, and time.  Thin, in acute distress  Cardiovascular: Normal rate, normal heart sounds and intact distal pulses.   No murmur heard. Pulmonary/Chest: Effort normal and breath sounds normal. She has no wheezes. She has no rales. She exhibits no tenderness.  Abdominal: Soft. Bowel sounds are normal. She exhibits no distension and no mass. There is tenderness ( diffuse abdominal tenderness).  Musculoskeletal: Normal range of motion.  Neurological: She is alert and oriented to person, place, and time.      CLINICAL DATA:  Chronic right-sided abdominal pain. Nausea, vomiting and diarrhea. Initial encounter.  EXAM: ABDOMEN ULTRASOUND COMPLETE  COMPARISON:  CT of the abdomen and pelvis performed 07/01/2015  FINDINGS: Gallbladder: Several large stones are seen within the gallbladder, measuring up to 1.5 cm in size. No gallbladder wall thickening or pericholecystic fluid is seen. However, a positive ultrasonographic Murphy's sign is elicited.  Common bile duct: Diameter: 0.3 cm, within normal limits in caliber.  Liver: No focal lesion identified. Within normal limits  in parenchymal echogenicity.  IVC: No abnormality visualized.  Pancreas: Not visualized due to overlying structures.  Spleen: Size and appearance within normal limits.  Right Kidney: Length: 9.8 cm. Echogenicity within normal limits. No mass or hydronephrosis visualized.  Left Kidney: Length: 10.1 cm. Echogenicity within normal limits. No mass or hydronephrosis visualized.  Abdominal aorta: No aneurysm visualized. Not characterized proximally due to overlying bowel gas.  Other findings: None.  IMPRESSION: Cholelithiasis noted, with a positive ultrasonographic Murphy's sign. This is of uncertain significance, as no gallbladder wall thickening or pericholecystic fluid is seen. Would follow the patient's symptoms, to exclude mild cholecystitis.   Electronically Signed    By: Garald Balding M.D.   On: 10/18/2015 00:52  Assessment & Plan:   1. Essential hypertension Uncontrolled due to running out of medications and also due to acute pain - lisinopril (PRINIVIL,ZESTRIL) 20 MG tablet; Take 1 tablet (20 mg total) by mouth daily.  Dispense: 30 tablet; Refill: 3  2. HYPERCHOLESTEROLEMIA - simvastatin (ZOCOR) 20 MG tablet; Take 1 tablet (20 mg total) by mouth daily at 6 PM.  Dispense: 30 tablet; Refill: 3  3. Diabetes mellitus due to underlying condition with hyperosmolarity without coma, without long-term current use of insulin (HCC) Uncontrolled with A1c of 12.1 She has been out of medications for some time - metFORMIN (GLUCOPHAGE) 1000 MG tablet; Take 1 tablet (1,000 mg total) by mouth 2 (two) times daily.  Dispense: 60 tablet; Refill: 3 - insulin glargine (LANTUS) 100 UNIT/ML injection; Inject 0.15 mLs (15 Units total) into the skin at bedtime.  Dispense: 10 mL; Refill: 11 - glipiZIDE (GLUCOTROL) 10 MG tablet; Take 1 tablet (10 mg total) by mouth 2 (two) times daily before a meal.  Dispense: 60 tablet; Refill: 3  4. Calculus of gallbladder without cholecystitis without obstruction In severe distress, has been unable to see the surgeon. We'll need to exclude cholecystitis; refer to the ED stat - traMADol (ULTRAM) 50 MG tablet; Take 1 tablet (50 mg total) by mouth every 8 (eight) hours as needed.  Dispense: 30 tablet; Refill: 0   Meds ordered this encounter  Medications  . lisinopril (PRINIVIL,ZESTRIL) 20 MG tablet    Sig: Take 1 tablet (20 mg total) by mouth daily.    Dispense:  30 tablet    Refill:  3  . metFORMIN (GLUCOPHAGE) 1000 MG tablet    Sig: Take 1 tablet (1,000 mg total) by mouth 2 (two) times daily.    Dispense:  60 tablet    Refill:  3  . simvastatin (ZOCOR) 20 MG tablet    Sig: Take 1 tablet (20 mg total) by mouth daily at 6 PM.    Dispense:  30 tablet    Refill:  3  . insulin glargine (LANTUS) 100 UNIT/ML injection    Sig: Inject  0.15 mLs (15 Units total) into the skin at bedtime.    Dispense:  10 mL    Refill:  11  . glipiZIDE (GLUCOTROL) 10 MG tablet    Sig: Take 1 tablet (10 mg total) by mouth 2 (two) times daily before a meal.    Dispense:  60 tablet    Refill:  3  . traMADol (ULTRAM) 50 MG tablet    Sig: Take 1 tablet (50 mg total) by mouth every 8 (eight) hours as needed.    Dispense:  30 tablet    Refill:  0    Follow-up: Return in about 2 weeks (around 11/29/2015) for Follow-up on cholelithiasis.  Arnoldo Morale MD

## 2015-11-15 NOTE — ED Notes (Signed)
Pt able to drink more of her sprite without any nausea. MD notified. Pt ready for d/c.

## 2015-11-15 NOTE — Patient Instructions (Signed)

## 2015-11-15 NOTE — ED Notes (Signed)
Per MD, okay for pt to eat/drink. Provided with [place] sandwich and sprite.

## 2015-11-15 NOTE — ED Notes (Signed)
MD at bedside. 

## 2015-11-15 NOTE — Care Management (Addendum)
ED CM spoke with patient today concerning, referral to [facility]. Patient states, she has followed up to established care at the [facility].  But, has been unable to schedule appointment with [facility] for gallbladder surgery due to affordability. Patient has [employer] to [employer].  CM discussed with patient her recommended gallbladder surgery is unfortunately  considered  elective at this time, but if her condition continues to worsen patient should return to ED,she verbalized understanding teach back done,  Patient reminded to follow up for post ED visit with her PCP. No further ED CM needs identified

## 2015-11-16 ENCOUNTER — Telehealth: Payer: Self-pay | Admitting: Family Medicine

## 2015-11-16 MED FILL — traMADol HCL 50 MG TABS: 50 | 10 days supply | Qty: 30 | Fill #0

## 2015-11-16 NOTE — ED Notes (Signed)
Provided pt with bus pass. Patient verbalized understanding of discharge instructions and denies any further needs or questions at this time. VS stable. Patient ambulatory with steady gait, RN escorted to ED entrance.

## 2015-11-16 NOTE — Telephone Encounter (Signed)
Patient came to the office because she was at the hospital last night. Pt stated that ED doctor had to told her that she needed to speak with PCP regarding an urgent surgery that she will need to have at [facility]. Please follow up with patient.    Thank you.

## 2015-11-17 ENCOUNTER — Other Ambulatory Visit: Payer: Self-pay | Admitting: Family Medicine

## 2015-11-17 DIAGNOSIS — R634 Abnormal weight loss: Secondary | ICD-10-CM

## 2015-11-17 DIAGNOSIS — R112 Nausea with vomiting, unspecified: Secondary | ICD-10-CM

## 2015-11-17 DIAGNOSIS — K8020 Calculus of gallbladder without cholecystitis without obstruction: Secondary | ICD-10-CM

## 2015-11-17 MED FILL — GABAPENTIN 300 MG CAPSULE: 300 | 30 days supply | Qty: 90 | Fill #1

## 2015-11-17 NOTE — Telephone Encounter (Signed)
Writer spoke with patient who states that the only help she received in the ED the other day when she was sent from our clinic over to the ED, was pain relief.  Patient is trying to find a way to get to [facility] because she knows this is her only hope to have her gallbladder taken care of. Patient is trying to call "[employer]" transportation today in hopes that she could get a ride to [place] through them.

## 2015-11-24 ENCOUNTER — Encounter: Payer: Self-pay | Admitting: Surgery

## 2015-11-24 ENCOUNTER — Ambulatory Visit (INDEPENDENT_AMBULATORY_CARE_PROVIDER_SITE_OTHER): Payer: Self-pay | Admitting: Surgery

## 2015-11-24 VITALS — BP 142/81 | HR 81 | Temp 97.8°F | Ht 62.0 in | Wt 116.2 lb

## 2015-11-24 DIAGNOSIS — K420 Umbilical hernia with obstruction, without gangrene: Secondary | ICD-10-CM

## 2015-11-24 DIAGNOSIS — K8020 Calculus of gallbladder without cholecystitis without obstruction: Secondary | ICD-10-CM

## 2015-11-24 NOTE — Patient Instructions (Signed)
Speak to Education officer, museum to see if you can arrange someone to take you to the medical mall, stay with you, and then take you back home for surgery. You will not be able to undergo surgery unless you find someone to do that. Angie will be calling you to arrange a surgery date for you and to give you more information regarding your surgery

## 2015-11-24 NOTE — Progress Notes (Signed)
Subjective:     Patient ID: Lorraine Turner, female   DOB: 1960/03/24, 55 y.o.   MRN: [id number]  HPI  55 year old female but does have some medical issues of well-controlled hypertension and diabetes mellitus as well as schizophrenia. The patient states that since 2012 she's had intermittent epigastric to right upper quadrant pain. Patient states that the pain comes on about 8 out of 10. Patient states that she's noticed it more with greasy and fatty foods. Patient states that this pain will come on about 30 minutes after she eats sometimes it will  last for 30 minutes and sometimes it'll last for much longer. Patient states that the pain starts in her epigastrium feels like it's going straight through to her back and sometimes wrapping around her right side. Patient has had some nausea and vomiting previously with this. Patient has not had any diarrhea bloating or increased flatus. Patient states that it does decrease her appetite as well. She has noticed some chills whenever the pain is occurring as well but has not had any true fever. Patient has had a C-section in the past as well as an operation near her umbilicus previously she is unsure what that was for. Otherwise the patient today denies any fever chills nausea vomiting but she does endorse some epigastric and right-sided pain and some constipation.  Past Medical History:  Diagnosis Date  . Anxiety   . Bipolar 1 disorder ([place])   . Depression   . Gallstones   . Hypertension   . Schizophrenia ([place])    History reviewed. No pertinent surgical history. Family History  Problem Relation Age of Onset  . Diabetes Mother   . Heart disease Mother   . Kidney disease Mother   . Stroke Mother   . Hyperlipidemia Mother   . Diabetes Maternal Aunt    Social History   Social History  . Marital status: Married    Spouse name: N/A  . Number of children: N/A  . Years of education: N/A   Social History Main Topics  . Smoking status: Former  Smoker    Types: Cigarettes    Quit date: 03/14/2013  . Smokeless tobacco: Never Used     Comment: "once a month"  . Alcohol use No     Comment: every weekend  . Drug use: No  . Sexual activity: Not Currently   Other Topics Concern  . None   Social History Narrative  . None    Current Outpatient Prescriptions:  .  Blood Glucose Monitoring Suppl (TRUE METRIX METER) w/Device KIT, 1 Device by Does not apply route 3 (three) times daily., Disp: 1 kit, Rfl: 0 .  busPIRone (BUSPAR) 15 MG tablet, Take 7.5 mg by mouth 2 (two) times daily. Reported on 05/31/2015, Disp: , Rfl:  .  gabapentin (NEURONTIN) 300 MG capsule, Take 1 capsule (300 mg total) by mouth 3 (three) times daily., Disp: 90 capsule, Rfl: 5 .  glipiZIDE (GLUCOTROL) 10 MG tablet, Take 1 tablet (10 mg total) by mouth 2 (two) times daily before a meal., Disp: 60 tablet, Rfl: 3 .  glucose blood (TRUE METRIX BLOOD GLUCOSE TEST) test strip, Use as instructed, Disp: 100 each, Rfl: 12 .  glucose blood (TRUE METRIX BLOOD GLUCOSE TEST) test strip, Use as instructed, Disp: 100 each, Rfl: 12 .  insulin glargine (LANTUS) 100 UNIT/ML injection, Inject 0.15 mLs (15 Units total) into the skin at bedtime., Disp: 10 mL, Rfl: 11 .  Insulin Syringe-Needle U-100 31G X 5/16"  0.3 ML MISC, 1 each by Does not apply route 4 (four) times daily -  before meals and at bedtime., Disp: 100 each, Rfl: 12 .  lisinopril (PRINIVIL,ZESTRIL) 20 MG tablet, Take 1 tablet (20 mg total) by mouth daily., Disp: 30 tablet, Rfl: 3 .  metFORMIN (GLUCOPHAGE) 1000 MG tablet, Take 1 tablet (1,000 mg total) by mouth 2 (two) times daily., Disp: 60 tablet, Rfl: 3 .  Multiple Vitamin (MULTIVITAMIN) tablet, Take 1 tablet by mouth daily. Reported on 08/30/2015, Disp: , Rfl:  .  ondansetron (ZOFRAN ODT) 4 MG disintegrating tablet, Take 1 tablet (4 mg total) by mouth every 8 (eight) hours as needed for nausea or vomiting., Disp: 10 tablet, Rfl: 0 .  QUEtiapine (SEROQUEL) 50 MG tablet, Take 3  tablets (150 mg total) by mouth at bedtime. For mood control/depression, Disp: 90 tablet, Rfl: 0 .  sertraline (ZOLOFT) 100 MG tablet, Take 1 tablet (100 mg total) by mouth daily. For depression, Disp: 30 tablet, Rfl: 0 .  simvastatin (ZOCOR) 20 MG tablet, Take 1 tablet (20 mg total) by mouth daily at 6 PM., Disp: 30 tablet, Rfl: 3 .  traMADol (ULTRAM) 50 MG tablet, Take 1 tablet (50 mg total) by mouth every 8 (eight) hours as needed., Disp: 30 tablet, Rfl: 0 .  traZODone (DESYREL) 50 MG tablet, Take 25-50 mg by mouth at bedtime as needed for sleep. , Disp: , Rfl:  .  TRUEPLUS LANCETS 28G MISC, 1 each by Does not apply route 3 (three) times daily., Disp: 100 each, Rfl: 12 Allergies  Allergen Reactions  . Metformin And Related Nausea Only    Makes patient feel bad    Review of Systems  Constitutional: Negative for activity change, appetite change and fatigue.  HENT: Negative for congestion and sore throat.   Respiratory: Negative for cough, chest tightness, shortness of breath and wheezing.   Cardiovascular: Negative for chest pain, palpitations and leg swelling.  Gastrointestinal: Positive for abdominal pain, constipation, nausea and rectal pain. Negative for abdominal distention, blood in stool and diarrhea.  Genitourinary: Negative for dysuria.  Musculoskeletal: Negative for back pain.  Skin: Negative for color change, pallor, rash and wound.  Neurological: Negative for dizziness and weakness.  Hematological: Negative for adenopathy. Does not bruise/bleed easily.  Psychiatric/Behavioral: Negative for agitation. The patient is not nervous/anxious.   All other systems reviewed and are negative.      Vitals:   11/24/15 1510  BP: (!) 142/81  Pulse: 81  Temp: 97.8 F (36.6 C)    Objective:   Physical Exam  Constitutional: She is oriented to person, place, and time. She appears well-developed and well-nourished. No distress.  Appears younger than stated age  HENT:  Head:  Normocephalic and atraumatic.  Right Ear: External ear normal.  Left Ear: External ear normal.  Nose: Nose normal.  Mouth/Throat: Oropharynx is clear and moist. No oropharyngeal exudate.  Eyes: Conjunctivae and EOM are normal. Pupils are equal, round, and reactive to light. No scleral icterus.  Neck: Normal range of motion. Neck supple. No tracheal deviation present.  Cardiovascular: Normal rate, regular rhythm, normal heart sounds and intact distal pulses.  Exam reveals no gallop and no friction rub.   No murmur heard. Pulmonary/Chest: Effort normal and breath sounds normal. No respiratory distress. She has no wheezes. She has no rales.  Abdominal: Soft. Bowel sounds are normal. She exhibits no distension. There is tenderness. There is rebound. There is no guarding.  Epigastric and right upper quadrant tenderness  Musculoskeletal: She exhibits no edema, tenderness or deformity.  Neurological: She is alert and oriented to person, place, and time. No cranial nerve deficit.  Skin: Skin is warm and dry. No rash noted. No erythema. No pallor.  Psychiatric: She has a normal mood and affect. Her behavior is normal. Judgment and thought content normal.  Vitals reviewed.   CBC Latest Ref Rng & Units 11/15/2015 11/01/2015 10/17/2015  WBC 4.0 - 10.5 K/uL 5.1 5.5 5.9  Hemoglobin 12.0 - 15.0 g/dL 12.4 12.1 11.9(L)  Hematocrit 36.0 - 46.0 % 36.2 34.3(L) 35.5(L)  Platelets 150 - 400 K/uL 262 326 353   CMP Latest Ref Rng & Units 11/15/2015 11/01/2015 10/17/2015  Glucose 65 - 99 mg/dL 245(H) 260(H) 169(H)  BUN 6 - 20 mg/dL '7 13 12  ' Creatinine 0.44 - 1.00 mg/dL 0.67 0.62 0.71  Sodium 135 - 145 mmol/L 138 138 140  Potassium 3.5 - 5.1 mmol/L 3.8 4.0 4.4  Chloride 101 - 111 mmol/L 102 102 105  CO2 22 - 32 mmol/L '27 29 27  ' Calcium 8.9 - 10.3 mg/dL 9.9 10.0 10.1  Total Protein 6.5 - 8.1 g/dL 7.6 7.8 7.5  Total Bilirubin 0.3 - 1.2 mg/dL 1.3(H) 0.7 1.0  Alkaline Phos 38 - 126 U/L 58 61 53  AST 15 - 41 U/L 16  13(L) 14(L)  ALT 14 - 54 U/L '21 15 16   ' Images:   U/S: 10/18/15: IMPRESSION: Cholelithiasis noted, with a positive ultrasonographic Murphy's sign. This is of uncertain significance, as no gallbladder wall thickening or pericholecystic fluid is seen. Would follow the patient's symptoms, to exclude mild cholecystitis.  CT scan: 11/15/15: IMPRESSION: Uncomplicated cholelithiasis. Three gallstones, the largest approximately 8 mm are seen without wall thickening nor pericholecystic fluid.  Constipation.  No right lower quadrant inflammatory process noted.  SI joint osteoarthritis bilaterally.   Assessment:     55 yr old female with symptomatic cholelithiasis and umbilical hernia     Plan:     I have personally reviewed her past medical history which does include well-controlled hypertension and diabetes mellitus with insulin as well as some schizophrenia and depression that have been well controlled with medications. I have personally reviewed the patient's laboratory values were she does have a normal white blood cell count but a very slightly increased bilirubin of 1.3. I have personally reviewed her ultrasound images which showed some gallstones in the gallbladder wall but no thickening. I've also reviewed her CT scan images which again showed some gallstones but no wall thickening inflammation or other pathology aside from some constipation. I also reviewed the radiology reads as above.  The risks, benefits, complications, treatment options, and expected outcomes were discussed with the patient. The possibilities of bleeding, recurrent infection, finding a normal gallbladder, perforation of viscus organs, damage to surrounding structures, bile leak, abscess formation, needing a drain placed, the need for additional procedures, reaction to medication, pulmonary aspiration,  failure to diagnose a condition, the possible need to convert to an open procedure, and creating a complication  requiring transfusion or operation were discussed with the patient. The patient concurred with the proposed plan, giving informed consent. I will book the patient for a laparoscopic cholecystectomy and umbilical hernia repair during the first week of November.

## 2015-11-29 ENCOUNTER — Telehealth: Payer: Self-pay | Admitting: Surgery

## 2015-11-29 NOTE — Telephone Encounter (Signed)
Pt advised of pre op date/time and sx date. Sx: 12/21/15 with Dr Lenore Manner cholecystectomy.  Pre op: 12/12/15 between 9-1:00pm--phone.   Patient made aware to call [phone], between 1-3:00pm the day before surgery, to find out what time to arrive.

## 2015-12-12 ENCOUNTER — Encounter
Admission: RE | Admit: 2015-12-12 | Discharge: 2015-12-12 | Disposition: A | Discharge status: Home / Self Care | Payer: Self-pay | Source: Ambulatory Visit | Attending: Surgery | Admitting: Surgery

## 2015-12-12 HISTORY — DX: Unspecified convulsions: R56.9

## 2015-12-12 HISTORY — DX: Type 2 diabetes mellitus without complications: E11.9

## 2015-12-12 NOTE — Patient Instructions (Signed)
  Your procedure is scheduled on: 12-21-15 [facility]) Report to Same Day Surgery 2nd floor medical mall To find out your arrival time please call [phone] between 1PM - 3PM on 12-20-15 [facility]  Remember: Instructions that are not followed completely may result in serious medical risk, up to and including death, or upon the discretion of your surgeon and anesthesiologist your surgery may need to be rescheduled.    _x___ 1. Do not eat food or drink liquids after midnight. No gum chewing or hard candies.     __x__ 2. No Alcohol for 24 hours before or after surgery.   __x__3. No Smoking for 24 prior to surgery.   ____  4. Bring all medications with you on the day of surgery if instructed.    __x__ 5. Notify your doctor if there is any change in your medical condition     (cold, fever, infections).     Do not wear jewelry, make-up, hairpins, clips or nail polish.  Do not wear lotions, powders, or perfumes. You may wear deodorant.  Do not shave 48 hours prior to surgery. Men may shave face and neck.  Do not bring valuables to the hospital.    [facility] is not responsible for any belongings or valuables.               Contacts, dentures or bridgework may not be worn into surgery.  Leave your suitcase in the car. After surgery it may be brought to your room.  For patients admitted to the hospital, discharge time is determined by your treatment team.   Patients discharged the day of surgery will not be allowed to drive home.    Please read over the following fact sheets that you were given:   [facility] Preparing for Surgery and or MRSA Information   _x___ Take these medicines the morning of surgery with A SIP OF WATER:    1. BUSPAR (BUSPIRONE)  2. GABAPENTIN (NEURONTIN)  3. LISINOPRIL  4. ZOLOFT (SERTRALINE)  5.  6.  ____Fleets enema or Magnesium Citrate as directed.   _x___ Use CHG Soap or sage wipes as directed on instruction sheet   ____ Use inhalers on the day of  surgery and bring to hospital day of surgery  ____ Stop metformin 2 days prior to surgery    ____ Take 1/2 of usual insulin dose the night before surgery and none on the morning of surgery.   ____ Stop aspirin or coumadin, or plavix  x__ Stop Anti-inflammatories such as Advil, Aleve, Ibuprofen, Motrin, Naproxen,          Naprosyn, Goodies powders or aspirin products-STOP ALEVE AND ADVIL 7 DAYS PRIOR TO SURGERY-Ok to take Tylenol OR TRAMADOL   ____ Stop supplements until after surgery.    ____ Bring C-Pap to the hospital.

## 2015-12-14 ENCOUNTER — Encounter
Admission: RE | Admit: 2015-12-14 | Discharge: 2015-12-14 | Disposition: A | Discharge status: Home / Self Care | Payer: Self-pay | Source: Ambulatory Visit | Attending: Surgery | Admitting: Surgery

## 2015-12-14 DIAGNOSIS — Z01812 Encounter for preprocedural laboratory examination: Secondary | ICD-10-CM | POA: Insufficient documentation

## 2015-12-14 LAB — CBC WITH DIFFERENTIAL/PLATELET
BASOS ABS: 0 10*3/uL (ref 0–0.1)
BASOS PCT: 1 %
EOS ABS: 0.2 10*3/uL (ref 0–0.7)
EOS PCT: 3 %
HCT: 34.1 % — ABNORMAL LOW (ref 35.0–47.0)
Hemoglobin: 12 g/dL (ref 12.0–16.0)
LYMPHS PCT: 30 %
Lymphs Abs: 1.9 10*3/uL (ref 1.0–3.6)
MCH: 30.9 pg (ref 26.0–34.0)
MCHC: 35.2 g/dL (ref 32.0–36.0)
MCV: 87.6 fL (ref 80.0–100.0)
Monocytes Absolute: 0.6 10*3/uL (ref 0.2–0.9)
Monocytes Relative: 9 %
Neutro Abs: 3.8 10*3/uL (ref 1.4–6.5)
Neutrophils Relative %: 57 %
PLATELETS: 275 10*3/uL (ref 150–440)
RBC: 3.89 MIL/uL (ref 3.80–5.20)
RDW: 12.9 % (ref 11.5–14.5)
WBC: 6.5 10*3/uL (ref 3.6–11.0)

## 2015-12-14 LAB — COMPREHENSIVE METABOLIC PANEL
ALBUMIN: 4 g/dL (ref 3.5–5.0)
ALT: 18 U/L (ref 14–54)
AST: 16 U/L (ref 15–41)
Alkaline Phosphatase: 69 U/L (ref 38–126)
Anion gap: 5 (ref 5–15)
BUN: 9 mg/dL (ref 6–20)
CHLORIDE: 103 mmol/L (ref 101–111)
CO2: 31 mmol/L (ref 22–32)
CREATININE: 0.68 mg/dL (ref 0.44–1.00)
Calcium: 9.6 mg/dL (ref 8.9–10.3)
GFR calc Af Amer: >60 mL/min (ref >60)
GFR calc non Af Amer: >60 mL/min (ref >60)
Glucose, Bld: 311 mg/dL — ABNORMAL HIGH (ref 65–99)
Potassium: 3.7 mmol/L (ref 3.5–5.1)
SODIUM: 139 mmol/L (ref 135–145)
Total Bilirubin: 0.9 mg/dL (ref 0.3–1.2)
Total Protein: 7.5 g/dL (ref 6.5–8.1)

## 2015-12-15 ENCOUNTER — Telehealth: Payer: Self-pay

## 2015-12-15 NOTE — Pre-Procedure Instructions (Signed)
Called dr Ronelle Nigh regarding blood glucose of 311- informed him that pt has stopped metformin, glipizide and lantus due to being sick with gallbladder and not being able to keep foods/liquids down at times.  Dr Ronelle Nigh said pt needs to be optimized for surgery therefore she needs medical clearance-called and spoke with Freda Munro at Dr Langston Masker office and she is going to take care of clearance-faxed this over to dr loflins office with clearance request and labs-fax confirmation received

## 2015-12-15 NOTE — Telephone Encounter (Signed)
Medical Clearance faxed at this time to [name].  Lorraine Turner from pre-admit called to say anesthesia is requesting Medical Clearance on patient due to Glucose levels. Per Lorraine Turner -patient has not been taking her medications due to being so sick with her gallbladder.

## 2015-12-18 ENCOUNTER — Other Ambulatory Visit: Payer: Self-pay | Admitting: Family Medicine

## 2015-12-18 DIAGNOSIS — K8020 Calculus of gallbladder without cholecystitis without obstruction: Secondary | ICD-10-CM

## 2015-12-18 MED ORDER — TRAMADOL HCL 50 MG PO TABS: 50.0000 mg | ORAL_TABLET | Freq: Three times a day (TID) | ORAL | 0 refills | Status: DC | PRN

## 2015-12-18 NOTE — Telephone Encounter (Signed)
Patient came in the office requesting medication refill for traMADol (ULTRAM) 50 MG tablet.  Thank you.

## 2015-12-18 NOTE — Telephone Encounter (Signed)
Ready for pick up

## 2015-12-19 ENCOUNTER — Ambulatory Visit: Payer: Self-pay | Attending: Family Medicine | Admitting: Family Medicine

## 2015-12-19 ENCOUNTER — Telehealth: Payer: Self-pay

## 2015-12-19 ENCOUNTER — Encounter: Payer: Self-pay | Admitting: Family Medicine

## 2015-12-19 VITALS — BP 87/52 | HR 75 | Temp 97.7°F | Ht 62.0 in | Wt 114.2 lb

## 2015-12-19 DIAGNOSIS — Z794 Long term (current) use of insulin: Secondary | ICD-10-CM

## 2015-12-19 DIAGNOSIS — L0292 Furuncle, unspecified: Secondary | ICD-10-CM

## 2015-12-19 DIAGNOSIS — E870 Hyperosmolality and hypernatremia: Secondary | ICD-10-CM | POA: Insufficient documentation

## 2015-12-19 DIAGNOSIS — K8020 Calculus of gallbladder without cholecystitis without obstruction: Secondary | ICD-10-CM | POA: Insufficient documentation

## 2015-12-19 DIAGNOSIS — E1165 Type 2 diabetes mellitus with hyperglycemia: Secondary | ICD-10-CM | POA: Insufficient documentation

## 2015-12-19 DIAGNOSIS — L02421 Furuncle of right axilla: Secondary | ICD-10-CM | POA: Insufficient documentation

## 2015-12-19 DIAGNOSIS — I10 Essential (primary) hypertension: Secondary | ICD-10-CM

## 2015-12-19 DIAGNOSIS — F259 Schizoaffective disorder, unspecified: Secondary | ICD-10-CM | POA: Insufficient documentation

## 2015-12-19 DIAGNOSIS — E785 Hyperlipidemia, unspecified: Secondary | ICD-10-CM | POA: Insufficient documentation

## 2015-12-19 DIAGNOSIS — E0800 Diabetes mellitus due to underlying condition with hyperosmolarity without nonketotic hyperglycemic-hyperosmolar coma (NKHHC): Secondary | ICD-10-CM

## 2015-12-19 DIAGNOSIS — Z79899 Other long term (current) drug therapy: Secondary | ICD-10-CM | POA: Insufficient documentation

## 2015-12-19 LAB — GLUCOSE, POCT (MANUAL RESULT ENTRY): POC GLUCOSE: 229 mg/dL — AB (ref 70–99)

## 2015-12-19 LAB — POCT GLYCOSYLATED HEMOGLOBIN (HGB A1C): HEMOGLOBIN A1C: 9.7

## 2015-12-19 MED ORDER — GLIPIZIDE 10 MG PO TABS
10.0000 mg | ORAL_TABLET | Freq: Two times a day (BID) | ORAL | 3 refills | Status: DC
Start: 2015-12-19 — End: 2016-04-09

## 2015-12-19 MED ORDER — METFORMIN HCL 1000 MG PO TABS
1000.0000 mg | ORAL_TABLET | Freq: Two times a day (BID) | ORAL | 3 refills | Status: DC
Start: 2015-12-19 — End: 2016-04-09

## 2015-12-19 MED ORDER — CEPHALEXIN 500 MG PO CAPS: 500.0000 mg | ORAL_CAPSULE | Freq: Two times a day (BID) | ORAL | 0 refills | Status: DC

## 2015-12-19 MED ORDER — INSULIN GLARGINE 100 UNIT/ML ~~LOC~~ SOLN: 15.0000 [IU] | Freq: Every day | SUBCUTANEOUS | 3 refills | Status: DC

## 2015-12-19 MED FILL — ?GLIPIZIDE 10 MG TABLET: 10 | 30 days supply | Qty: 60 | Fill #0

## 2015-12-19 MED FILL — ?METFORMIN HCL 1,000 MG TAB: 1000 | 30 days supply | Qty: 60 | Fill #0

## 2015-12-19 MED FILL — CEPHALEXIN 500 MG CAPSULE: 500 | 7 days supply | Qty: 14 | Fill #0

## 2015-12-19 MED FILL — traMADol HCL 50 MG TABS: 50 | 6 days supply | Qty: 20 | Fill #0

## 2015-12-19 MED FILL — !LANTUS 100 UNITS/ML VIAL: 100 | 66 days supply | Qty: 10 | Fill #0

## 2015-12-19 NOTE — Telephone Encounter (Signed)
Medical Clearance is denied at this time, however patient is at too high of a surgical risk for medical Clearance to be granted. Due to uncontrolled type 2 Diabetes mellitus, non compliance with medications, AlC of 9.7.   Clearance will be scanned under Media and faxed to pre-admit. Patients surgery is 12/21/15.

## 2015-12-19 NOTE — Progress Notes (Signed)
Subjective:  Patient ID: Lorraine Turner, female    DOB: 02-27-1960  Age: 55 y.o. MRN: [id number]  CC: Diabetes   HPI Lorraine Turner s a 55 year old female with history of type 2 diabetes mellitus (A1c 9.7), hypertension, schizoaffective disorder, hyperlipidemia, cholelithiasis who comes into the clinic For medical clearance for laparoscopic cholecystectomy scheduled for 12/21/15.  Blood work today reveals A1c of 9.7 and she endorses not taking any of her medications (glipizide, metformin, Lantus) due to reduced appetite and being sick in her stomach as she has had nausea and vomiting from abdominal pain.  Complains of a bump in her right axilla after she shaved with a razor blade and applied deodorant.. Denies drainage from the lesion and has not had any fevers.  At this time she is pain-free.   Past Medical History:  Diagnosis Date  . Anxiety   . Bipolar 1 disorder ([place])   . Depression   . Diabetes mellitus without complication ([place])   . Gallstones   . Hypertension   . Schizophrenia ([place])   . Seizures ([place])    X1- febrile seizure as a child-none since    Past Surgical History:  Procedure Laterality Date  . TONSILLECTOMY AND ADENOIDECTOMY     age 73    No Known Allergies   Outpatient Medications Prior to Visit  Medication Sig Dispense Refill  . Blood Glucose Monitoring Suppl (TRUE METRIX METER) w/Device KIT 1 Device by Does not apply route 3 (three) times daily. (Patient taking differently: 1 Device by Does not apply route daily. ) 1 kit 0  . gabapentin (NEURONTIN) 300 MG capsule Take 1 capsule (300 mg total) by mouth 3 (three) times daily. 90 capsule 5  . Multiple Vitamin (MULTIVITAMIN) tablet Take 1 tablet by mouth daily. Reported on 08/30/2015    . ondansetron (ZOFRAN ODT) 4 MG disintegrating tablet Take 1 tablet (4 mg total) by mouth every 8 (eight) hours as needed for nausea or vomiting. 10 tablet 0  . QUEtiapine (SEROQUEL) 50 MG tablet Take 3 tablets (150 mg  total) by mouth at bedtime. For mood control/depression 90 tablet 0  . simvastatin (ZOCOR) 20 MG tablet Take 1 tablet (20 mg total) by mouth daily at 6 PM. 30 tablet 3  . traMADol (ULTRAM) 50 MG tablet Take 1 tablet (50 mg total) by mouth every 8 (eight) hours as needed (for pain). 20 tablet 0  . busPIRone (BUSPAR) 15 MG tablet Take 7.5 mg by mouth 2 (two) times daily. Reported on 05/31/2015    . glucose blood (TRUE METRIX BLOOD GLUCOSE TEST) test strip Use as instructed (Patient not taking: Reported on 12/19/2015) 100 each 12  . glucose blood (TRUE METRIX BLOOD GLUCOSE TEST) test strip Use as instructed (Patient not taking: Reported on 12/19/2015) 100 each 12  . ibuprofen (ADVIL) 200 MG tablet Take 200 mg by mouth every 6 (six) hours as needed.    . Insulin Syringe-Needle U-100 31G X 5/16" 0.3 ML MISC 1 each by Does not apply route 4 (four) times daily -  before meals and at bedtime. (Patient not taking: Reported on 12/19/2015) 100 each 12  . Naproxen Sodium (ALEVE PO) Take 1 tablet by mouth as needed.    . sertraline (ZOLOFT) 100 MG tablet Take 1 tablet (100 mg total) by mouth daily. For depression (Patient not taking: Reported on 12/19/2015) 30 tablet 0  . traZODone (DESYREL) 50 MG tablet Take 25-50 mg by mouth at bedtime as needed for sleep.     [place] Kitchen  TRUEPLUS LANCETS 28G MISC 1 each by Does not apply route 3 (three) times daily. (Patient not taking: Reported on 12/19/2015) 100 each 12  . glipiZIDE (GLUCOTROL) 10 MG tablet Take 1 tablet (10 mg total) by mouth 2 (two) times daily before a meal. (Patient not taking: Reported on 12/19/2015) 60 tablet 3  . insulin glargine (LANTUS) 100 UNIT/ML injection Inject 0.15 mLs (15 Units total) into the skin at bedtime. (Patient not taking: Reported on 12/19/2015) 10 mL 11  . lisinopril (PRINIVIL,ZESTRIL) 20 MG tablet Take 1 tablet (20 mg total) by mouth daily. (Patient not taking: Reported on 12/19/2015) 30 tablet 3  . metFORMIN (GLUCOPHAGE) 1000 MG tablet Take 1 tablet  (1,000 mg total) by mouth 2 (two) times daily. (Patient not taking: Reported on 12/19/2015) 60 tablet 3   No facility-administered medications prior to visit.     ROS Review of Systems  Constitutional: Negative for activity change, appetite change and fatigue.  HENT: Negative for congestion, sinus pressure and sore throat.   Eyes: Negative for visual disturbance.  Respiratory: Negative for cough, chest tightness, shortness of breath and wheezing.   Cardiovascular: Negative for chest pain and palpitations.  Gastrointestinal: Negative for abdominal distention, abdominal pain and constipation.  Endocrine: Negative for polydipsia.  Genitourinary: Negative.  Negative for dysuria and frequency.  Musculoskeletal: Negative.  Negative for arthralgias and back pain.  Skin: Positive for rash.  Neurological: Negative for tremors, light-headedness and numbness.  Hematological: Does not bruise/bleed easily.  Psychiatric/Behavioral: Negative for agitation, behavioral problems and dysphoric mood.    Objective:  BP (!) 87/52 (BP Location: Right Arm, Patient Position: Sitting, Cuff Size: Small)   Pulse 75   Temp 97.7 F (36.5 C) (Oral)   Ht '5\' 2"'  (1.575 m)   Wt 114 lb 3.2 oz (51.8 kg)   SpO2 96%   BMI 20.89 kg/m   BP/Weight 12/19/2015 11/24/2015 74/02/2876  Systolic BP 87 676 720  Diastolic BP 52 81 70  Wt. (Lbs) 114.2 116.2 -  BMI 20.89 21.25 -  Some encounter information is confidential and restricted. Go to Review Flowsheets activity to see all data.      Physical Exam  Constitutional: She is oriented to person, place, and time. She appears well-developed and well-nourished.  Cardiovascular: Normal rate, normal heart sounds and intact distal pulses.   No murmur heard. Pulmonary/Chest: Effort normal and breath sounds normal. She has no wheezes. She has no rales. She exhibits no tenderness.  Abdominal: Soft. Bowel sounds are normal. She exhibits no distension and no mass. There is no  tenderness.  Musculoskeletal: Normal range of motion.  Neurological: She is alert and oriented to person, place, and time.  Skin:  Tender boil in her right axilla, no drainage noted.    Lab Results  Component Value Date   HGBA1C 9.7 12/19/2015    CMP Latest Ref Rng & Units 12/14/2015 11/15/2015 11/01/2015  Glucose 65 - 99 mg/dL 311(H) 245(H) 260(H)  BUN 6 - 20 mg/dL '9 7 13  ' Creatinine 0.44 - 1.00 mg/dL 0.68 0.67 0.62  Sodium 135 - 145 mmol/L 139 138 138  Potassium 3.5 - 5.1 mmol/L 3.7 3.8 4.0  Chloride 101 - 111 mmol/L 103 102 102  CO2 22 - 32 mmol/L '31 27 29  ' Calcium 8.9 - 10.3 mg/dL 9.6 9.9 10.0  Total Protein 6.5 - 8.1 g/dL 7.5 7.6 7.8  Total Bilirubin 0.3 - 1.2 mg/dL 0.9 1.3(H) 0.7  Alkaline Phos 38 - 126 U/L 69 58 61  AST 15 - 41 U/L 16 16 13(L)  ALT 14 - 54 U/L '18 21 15    ' Assessment & Plan:   1. Type 2 diabetes mellitus with hyperglycemia, without long-term current use of insulin (HCC) Uncontrolled with A1c of 9.7 Patient is too high of a surgical risks to be cleared for laparoscopic cholecystectomy. Advised to commenced medications as prescribed and if A1c drops at her next visit she will be cleared. - Glucose (CBG) - HgB A1c  2. Diabetes mellitus due to underlying condition with hyperosmolarity without coma, without long-term current use of insulin (HCC) Emphasize compliance and she has been advised to cut back on Lantus 15 units to 5 units in the event of hypoglycemia Keep blood sugar logs with fasting goals of 80-120 mg/dl, random of less than 180 and in the event of sugars less than 60 mg/dl or greater than 400 mg/dl please notify the clinic ASAP. It is recommended that you undergo annual eye exams and annual foot exams. Pneumovax is recommended every 5 years before the age of 27 and once for a lifetime at or after the age of 54. - glipiZIDE (GLUCOTROL) 10 MG tablet; Take 1 tablet (10 mg total) by mouth 2 (two) times daily before a meal.  Dispense: 60 tablet;  Refill: 3 - insulin glargine (LANTUS) 100 UNIT/ML injection; Inject 0.15 mLs (15 Units total) into the skin at bedtime.  Dispense: 10 mL; Refill: 3 - metFORMIN (GLUCOPHAGE) 1000 MG tablet; Take 1 tablet (1,000 mg total) by mouth 2 (two) times daily.  Dispense: 60 tablet; Refill: 3  3. Furuncle Placed on Keflex  4. Hypertension Blood pressure is in the low side. I have discontinued lisinopril  Meds ordered this encounter  Medications  . glipiZIDE (GLUCOTROL) 10 MG tablet    Sig: Take 1 tablet (10 mg total) by mouth 2 (two) times daily before a meal.    Dispense:  60 tablet    Refill:  3  . insulin glargine (LANTUS) 100 UNIT/ML injection    Sig: Inject 0.15 mLs (15 Units total) into the skin at bedtime.    Dispense:  10 mL    Refill:  3  . metFORMIN (GLUCOPHAGE) 1000 MG tablet    Sig: Take 1 tablet (1,000 mg total) by mouth 2 (two) times daily.    Dispense:  60 tablet    Refill:  3  . cephALEXin (KEFLEX) 500 MG capsule    Sig: Take 1 capsule (500 mg total) by mouth 2 (two) times daily.    Dispense:  14 capsule    Refill:  0    Follow-up: Return in about 1 month (around 01/18/2016) for follow up of Diabetes and clearance for surgery.   Arnoldo Morale MD

## 2015-12-19 NOTE — Patient Instructions (Signed)
Diabetes Mellitus and Food It is important for you to manage your blood sugar (glucose) level. Your blood glucose level can be greatly affected by what you eat. Eating healthier foods in the appropriate amounts throughout the day at about the same time each day will help you control your blood glucose level. It can also help slow or prevent worsening of your diabetes mellitus. Healthy eating may even help you improve the level of your blood pressure and reach or maintain a healthy weight.  General recommendations for healthful eating and cooking habits include:  Eating meals and snacks regularly. Avoid going long periods of time without eating to lose weight.  Eating a diet that consists mainly of plant-based foods, such as fruits, vegetables, nuts, legumes, and whole grains.  Using low-heat cooking methods, such as baking, instead of high-heat cooking methods, such as deep frying. Work with your dietitian to make sure you understand how to use the Nutrition Facts information on food labels. HOW CAN FOOD AFFECT ME? Carbohydrates Carbohydrates affect your blood glucose level more than any other type of food. Your dietitian will help you determine how many carbohydrates to eat at each meal and teach you how to count carbohydrates. Counting carbohydrates is important to keep your blood glucose at a healthy level, especially if you are using insulin or taking certain medicines for diabetes mellitus. Alcohol Alcohol can cause sudden decreases in blood glucose (hypoglycemia), especially if you use insulin or take certain medicines for diabetes mellitus. Hypoglycemia can be a life-threatening condition. Symptoms of hypoglycemia (sleepiness, dizziness, and disorientation) are similar to symptoms of having too much alcohol.  If your health care provider has given you approval to drink alcohol, do so in moderation and use the following guidelines:  Women should not have more than one drink per day, and men  should not have more than two drinks per day. One drink is equal to:  12 oz of beer.  5 oz of wine.  1 oz of hard liquor.  Do not drink on an empty stomach.  Keep yourself hydrated. Have water, diet soda, or unsweetened iced tea.  Regular soda, juice, and other mixers might contain a lot of carbohydrates and should be counted. WHAT FOODS ARE NOT RECOMMENDED? As you make food choices, it is important to remember that all foods are not the same. Some foods have fewer nutrients per serving than other foods, even though they might have the same number of calories or carbohydrates. It is difficult to get your body what it needs when you eat foods with fewer nutrients. Examples of foods that you should avoid that are high in calories and carbohydrates but low in nutrients include:  Trans fats (most processed foods list trans fats on the Nutrition Facts label).  Regular soda.  Juice.  Candy.  Sweets, such as cake, pie, doughnuts, and cookies.  Fried foods. WHAT FOODS CAN I EAT? Eat nutrient-rich foods, which will nourish your body and keep you healthy. The food you should eat also will depend on several factors, including:  The calories you need.  The medicines you take.  Your weight.  Your blood glucose level.  Your blood pressure level.  Your cholesterol level. You should eat a variety of foods, including:  Protein.  Lean cuts of meat.  Proteins low in saturated fats, such as fish, egg whites, and beans. Avoid processed meats.  Fruits and vegetables.  Fruits and vegetables that may help control blood glucose levels, such as apples, mangoes, and   yams.  Dairy products.  Choose fat-free or low-fat dairy products, such as milk, yogurt, and cheese.  Grains, bread, pasta, and rice.  Choose whole grain products, such as multigrain bread, whole oats, and brown rice. These foods may help control blood pressure.  Fats.  Foods containing healthful fats, such as nuts,  avocado, olive oil, canola oil, and fish. DOES EVERYONE WITH DIABETES MELLITUS HAVE THE SAME MEAL PLAN? Because every person with diabetes mellitus is different, there is not one meal plan that works for everyone. It is very important that you meet with a dietitian who will help you create a meal plan that is just right for you.   This information is not intended to replace advice given to you by your health care provider. Make sure you discuss any questions you have with your health care provider.   Document Released: 10/25/2004 Document Revised: 02/18/2014 Document Reviewed: 12/25/2012 Elsevier Interactive Patient Education 2016 [employer]  

## 2015-12-19 NOTE — Progress Notes (Signed)
Under both arms there are painful bumps Medication refill

## 2015-12-21 ENCOUNTER — Ambulatory Visit: Admission: RE | Admit: 2015-12-21 | Payer: Self-pay | Source: Ambulatory Visit | Admitting: Surgery

## 2015-12-21 ENCOUNTER — Encounter: Admission: RE | Payer: Self-pay | Source: Ambulatory Visit

## 2015-12-21 SURGERY — LAPAROSCOPIC CHOLECYSTECTOMY WITH INTRAOPERATIVE CHOLANGIOGRAM
Anesthesia: General

## 2015-12-21 NOTE — Telephone Encounter (Signed)
Spoke with Lorraine Turner in [place], case cancelled. Patient to call back once her Diabetes is under control and surgery can be safely done.

## 2015-12-22 ENCOUNTER — Telehealth: Payer: Self-pay | Admitting: Family Medicine

## 2015-12-22 DIAGNOSIS — S025XXA Fracture of tooth (traumatic), initial encounter for closed fracture: Secondary | ICD-10-CM

## 2015-12-22 NOTE — Telephone Encounter (Signed)
Pt requesting a referral to the dentist to get tooth pulled

## 2015-12-25 ENCOUNTER — Telehealth: Payer: Self-pay

## 2015-12-25 NOTE — Telephone Encounter (Signed)
Patient notified that dental referral was placed.  Patient stated understanding.

## 2015-12-25 NOTE — Telephone Encounter (Signed)
Done

## 2015-12-28 NOTE — Telephone Encounter (Signed)
Pt. Called requesting to speak with her nurse regarding a letter she received. Please f/u

## 2016-01-03 ENCOUNTER — Telehealth: Payer: Self-pay | Admitting: Family Medicine

## 2016-01-03 NOTE — Telephone Encounter (Signed)
Patient returned nurse' call regarding a dental referral. Please follow up.   Thank you.

## 2016-01-03 NOTE — Telephone Encounter (Signed)
Writer called patient to discuss letter she received.  Requested patient to return call to discuss.

## 2016-01-03 NOTE — Telephone Encounter (Signed)
Writer called patient back for the third time.  Writer requested that when she calls back she shares a full message will the receiver of the message.

## 2016-01-10 ENCOUNTER — Telehealth: Payer: Self-pay

## 2016-01-10 NOTE — Telephone Encounter (Signed)
Spoke with patient who states that she has called several dentists but they want to charge her money to have her tooth pulled. Writer sent a note to Lorraine Turner asking her to look into it because a referral was placed by Dr. Jarold Song.

## 2016-01-18 ENCOUNTER — Ambulatory Visit: Payer: Self-pay | Attending: Family Medicine | Admitting: Family Medicine

## 2016-01-18 ENCOUNTER — Telehealth: Payer: Self-pay

## 2016-01-18 ENCOUNTER — Encounter: Payer: Self-pay | Admitting: Family Medicine

## 2016-01-18 ENCOUNTER — Other Ambulatory Visit: Payer: Self-pay | Admitting: Family Medicine

## 2016-01-18 VITALS — BP 129/83 | HR 73 | Temp 98.1°F | Ht 62.5 in | Wt 118.8 lb

## 2016-01-18 DIAGNOSIS — E785 Hyperlipidemia, unspecified: Secondary | ICD-10-CM | POA: Insufficient documentation

## 2016-01-18 DIAGNOSIS — F259 Schizoaffective disorder, unspecified: Secondary | ICD-10-CM | POA: Insufficient documentation

## 2016-01-18 DIAGNOSIS — I10 Essential (primary) hypertension: Secondary | ICD-10-CM | POA: Insufficient documentation

## 2016-01-18 DIAGNOSIS — Z79899 Other long term (current) drug therapy: Secondary | ICD-10-CM | POA: Insufficient documentation

## 2016-01-18 DIAGNOSIS — K8020 Calculus of gallbladder without cholecystitis without obstruction: Secondary | ICD-10-CM | POA: Insufficient documentation

## 2016-01-18 DIAGNOSIS — E1165 Type 2 diabetes mellitus with hyperglycemia: Secondary | ICD-10-CM | POA: Insufficient documentation

## 2016-01-18 DIAGNOSIS — Z1159 Encounter for screening for other viral diseases: Secondary | ICD-10-CM | POA: Insufficient documentation

## 2016-01-18 DIAGNOSIS — F319 Bipolar disorder, unspecified: Secondary | ICD-10-CM | POA: Insufficient documentation

## 2016-01-18 DIAGNOSIS — Z01818 Encounter for other preprocedural examination: Secondary | ICD-10-CM | POA: Insufficient documentation

## 2016-01-18 DIAGNOSIS — Z794 Long term (current) use of insulin: Secondary | ICD-10-CM | POA: Insufficient documentation

## 2016-01-18 LAB — GLUCOSE, POCT (MANUAL RESULT ENTRY): POC GLUCOSE: 68 mg/dL — AB (ref 70–99)

## 2016-01-18 LAB — POCT GLYCOSYLATED HEMOGLOBIN (HGB A1C): HEMOGLOBIN A1C: 8.4

## 2016-01-18 MED ORDER — ONDANSETRON 4 MG PO TBDP: 4.0000 mg | ORAL_TABLET | Freq: Three times a day (TID) | ORAL | 0 refills | Status: DC | PRN

## 2016-01-18 MED FILL — ONDANSETRON ODT 4 MG TABLET: 4 | 10 days supply | Qty: 30 | Fill #0

## 2016-01-18 NOTE — Progress Notes (Signed)
Subjective:  Patient ID: Lorraine Turner, female    DOB: Jul 09, 1960  Age: 55 y.o. MRN: [id number]  CC: Diabetes and surgery clearance   HPI Lorraine Turner is a 55 year old female with history of type 2 diabetes mellitus (A1c 8.4), hypertension, schizoaffective disorder, hyperlipidemia, cholelithiasis who comes into the clinic For medical clearance for laparoscopic cholecystectomy Which had been put on hold from last month due to an A1c of 9.7.  For mental health she goes to [facility]. She endorses compliance with all her medications and denies hypoglycemia; continues to complain of tooth ache on is waiting to hear from the dentist as I had referred her last month.  She would like to have an HIV test.  Past Medical History:  Diagnosis Date  . Anxiety   . Bipolar 1 disorder ([place])   . Depression   . Diabetes mellitus without complication ([place])   . Gallstones   . Hypertension   . Schizophrenia ([place])   . Seizures ([place])    X1- febrile seizure as a child-none since    Past Surgical History:  Procedure Laterality Date  . TONSILLECTOMY AND ADENOIDECTOMY     age 46    No Known Allergies   Outpatient Medications Prior to Visit  Medication Sig Dispense Refill  . Blood Glucose Monitoring Suppl (TRUE METRIX METER) w/Device KIT 1 Device by Does not apply route 3 (three) times daily. (Patient taking differently: 1 Device by Does not apply route daily. ) 1 kit 0  . busPIRone (BUSPAR) 15 MG tablet Take 7.5 mg by mouth 2 (two) times daily. Reported on 05/31/2015    . gabapentin (NEURONTIN) 300 MG capsule Take 1 capsule (300 mg total) by mouth 3 (three) times daily. 90 capsule 5  . glipiZIDE (GLUCOTROL) 10 MG tablet Take 1 tablet (10 mg total) by mouth 2 (two) times daily before a meal. 60 tablet 3  . glucose blood (TRUE METRIX BLOOD GLUCOSE TEST) test strip Use as instructed 100 each 12  . glucose blood (TRUE METRIX BLOOD GLUCOSE TEST) test strip Use as instructed 100 each 12  . insulin  glargine (LANTUS) 100 UNIT/ML injection Inject 0.15 mLs (15 Units total) into the skin at bedtime. 10 mL 3  . Insulin Syringe-Needle U-100 31G X 5/16" 0.3 ML MISC 1 each by Does not apply route 4 (four) times daily -  before meals and at bedtime. 100 each 12  . metFORMIN (GLUCOPHAGE) 1000 MG tablet Take 1 tablet (1,000 mg total) by mouth 2 (two) times daily. 60 tablet 3  . Multiple Vitamin (MULTIVITAMIN) tablet Take 1 tablet by mouth daily. Reported on 08/30/2015    . QUEtiapine (SEROQUEL) 50 MG tablet Take 3 tablets (150 mg total) by mouth at bedtime. For mood control/depression 90 tablet 0  . simvastatin (ZOCOR) 20 MG tablet Take 1 tablet (20 mg total) by mouth daily at 6 PM. 30 tablet 3  . traMADol (ULTRAM) 50 MG tablet Take 1 tablet (50 mg total) by mouth every 8 (eight) hours as needed (for pain). 20 tablet 0  . traZODone (DESYREL) 50 MG tablet Take 25-50 mg by mouth at bedtime as needed for sleep.     . TRUEPLUS LANCETS 28G MISC 1 each by Does not apply route 3 (three) times daily. 100 each 12  . ibuprofen (ADVIL) 200 MG tablet Take 200 mg by mouth every 6 (six) hours as needed.    . Naproxen Sodium (ALEVE PO) Take 1 tablet by mouth as needed.    Marland Kitchen  sertraline (ZOLOFT) 100 MG tablet Take 1 tablet (100 mg total) by mouth daily. For depression (Patient not taking: Reported on 01/18/2016) 30 tablet 0  . cephALEXin (KEFLEX) 500 MG capsule Take 1 capsule (500 mg total) by mouth 2 (two) times daily. 14 capsule 0  . ondansetron (ZOFRAN ODT) 4 MG disintegrating tablet Take 1 tablet (4 mg total) by mouth every 8 (eight) hours as needed for nausea or vomiting. (Patient not taking: Reported on 01/18/2016) 10 tablet 0   No facility-administered medications prior to visit.     ROS Review of Systems  Constitutional: Negative for activity change, appetite change and fatigue.  HENT: Positive for dental problem. Negative for congestion, sinus pressure and sore throat.   Eyes: Negative for visual disturbance.    Respiratory: Negative for cough, chest tightness, shortness of breath and wheezing.   Cardiovascular: Negative for chest pain and palpitations.  Gastrointestinal: Negative for abdominal distention, abdominal pain and constipation.  Endocrine: Negative for polydipsia.  Genitourinary: Negative for dysuria and frequency.  Musculoskeletal: Negative for arthralgias and back pain.  Skin: Negative for rash.  Neurological: Negative for tremors, light-headedness and numbness.  Hematological: Does not bruise/bleed easily.  Psychiatric/Behavioral: Negative for agitation and behavioral problems.    Objective:  BP 129/83 (BP Location: Right Arm, Patient Position: Sitting, Cuff Size: Small)   Pulse 73   Temp 98.1 F (36.7 C) (Oral)   Ht 5' 2.5" (1.588 m)   Wt 118 lb 12.8 oz (53.9 kg)   SpO2 100%   BMI 21.38 kg/m   BP/Weight 01/18/2016 12/19/2015 95/18/8416  Systolic BP 606 87 301  Diastolic BP 83 52 81  Wt. (Lbs) 118.8 114.2 116.2  BMI 21.38 20.89 21.25  Some encounter information is confidential and restricted. Go to Review Flowsheets activity to see all data.      Physical Exam  Constitutional: She is oriented to person, place, and time. She appears well-developed and well-nourished.  Cardiovascular: Normal rate, normal heart sounds and intact distal pulses.   No murmur heard. Pulmonary/Chest: Effort normal and breath sounds normal. She has no wheezes. She has no rales. She exhibits no tenderness.  Abdominal: Soft. Bowel sounds are normal. She exhibits no distension and no mass. There is no tenderness.  Musculoskeletal: Normal range of motion.  Neurological: She is alert and oriented to person, place, and time.     Lab Results  Component Value Date   HGBA1C 8.4 01/18/2016    Assessment & Plan:   1. Type 2 diabetes mellitus with hyperglycemia, without long-term current use of insulin (HCC) Improved with A1c of 8.4 which is down from 9.7 previously. Emphasize compliance with  diet and medications - Glucose (CBG) - HgB A1c - Microalbumin / creatinine urine ratio  2. Screening for viral disease - HIV antibody (with reflex) - Hepatitis C antibody, reflex  3. Pre-operative clearance EKG from 11/15/15 revealed normal sinus rhythm Optimized for moderate risk procedure Patient to call surgeon's office so not the paperwork and be completed optimizing her for procedure She will need to hold off on taking oral medications morning of procedure but will take Lantus the night before   4. Symptomatic cholelithiasis She will be in touch with the surgeon's office to reschedule her surgery. - ondansetron (ZOFRAN ODT) 4 MG disintegrating tablet; Take 1 tablet (4 mg total) by mouth every 8 (eight) hours as needed for nausea or vomiting.  Dispense: 30 tablet; Refill: 0   Meds ordered this encounter  Medications  . ondansetron [facility]  ODT) 4 MG disintegrating tablet    Sig: Take 1 tablet (4 mg total) by mouth every 8 (eight) hours as needed for nausea or vomiting.    Dispense:  30 tablet    Refill:  0    Follow-up: Return in about 6 weeks (around 02/29/2016) for complete Physical exam.   Arnoldo Morale MD

## 2016-01-18 NOTE — Telephone Encounter (Signed)
Had surgery scheduled for 12/21/2015 but it was canceled because she needed medical clearance. Patient called stating that the doctor cleared her for surgery. I explained to the patient that we have to wait for that clearance to come back to [place] before we can schedule her. Patient understood.

## 2016-01-18 NOTE — Telephone Encounter (Signed)
Medical Clearance is faxed to Dr.Enobong Amao at this time. Patient was seen in office this morning.

## 2016-01-19 LAB — MICROALBUMIN / CREATININE URINE RATIO
CREATININE, URINE: 125 mg/dL (ref 20–320)
Microalb Creat Ratio: 5 mcg/mg creat (ref <30)
Microalb, Ur: 0.6 mg/dL

## 2016-01-19 LAB — HIV ANTIBODY (ROUTINE TESTING W REFLEX): HIV 1&2 Ab, 4th Generation: NONREACTIVE

## 2016-01-19 LAB — HEPATITIS C ANTIBODY: HCV AB: NEGATIVE

## 2016-01-19 NOTE — Telephone Encounter (Signed)
Spoke with patient and follow up appointment was made for 03/06/16 with Dr.Loflin. Patient verbalized understanding.

## 2016-01-24 ENCOUNTER — Telehealth: Payer: Self-pay | Admitting: Family Medicine

## 2016-01-24 NOTE — Telephone Encounter (Signed)
Patient is calling to get results from her lab work.  Please follow up

## 2016-01-25 ENCOUNTER — Telehealth: Payer: Self-pay

## 2016-01-25 MED FILL — GABAPENTIN 300 MG CAPSULE: 300 | 30 days supply | Qty: 90 | Fill #2

## 2016-01-25 NOTE — Telephone Encounter (Signed)
Patient came in to clinic today looking for her lab results.   Writer printed them per patient's request and discussed them with her.  Patient stated understanding.

## 2016-01-25 NOTE — Telephone Encounter (Signed)
-----   Message from Arnoldo Morale, MD sent at 01/19/2016  8:43 AM EST ----- Please inform that she tested negative for hepatitis C. Thank you.

## 2016-01-25 NOTE — Telephone Encounter (Signed)
Lab results given to patient today per Dr. Jarold Song.

## 2016-01-25 NOTE — Telephone Encounter (Signed)
Medical Clearances obtained at this time from [place] and will be scanned under Media.

## 2016-01-26 ENCOUNTER — Ambulatory Visit: Payer: Self-pay | Attending: Internal Medicine

## 2016-03-01 MED FILL — ?METFORMIN HCL 1,000 MG TAB: 1000 | 30 days supply | Qty: 60 | Fill #1

## 2016-03-01 MED FILL — glipiZIDE 10 MG TABS: 10 | 30 days supply | Qty: 60 | Fill #1

## 2016-03-01 MED FILL — GABAPENTIN 300 MG CAPSULE: 300 | 30 days supply | Qty: 90 | Fill #3

## 2016-03-05 MED FILL — TRUE METRIX TEST STRIP: 15 days supply | Qty: 50 | Fill #1

## 2016-03-05 MED FILL — ?LISINOPRIL 20 MG TABLET: 20 | 30 days supply | Qty: 30 | Fill #1

## 2016-03-06 ENCOUNTER — Ambulatory Visit: Payer: Self-pay | Admitting: Surgery

## 2016-03-06 ENCOUNTER — Other Ambulatory Visit: Payer: Self-pay

## 2016-03-07 ENCOUNTER — Ambulatory Visit (INDEPENDENT_AMBULATORY_CARE_PROVIDER_SITE_OTHER): Payer: Self-pay | Admitting: General Surgery

## 2016-03-07 DIAGNOSIS — K8020 Calculus of gallbladder without cholecystitis without obstruction: Secondary | ICD-10-CM

## 2016-03-07 NOTE — Patient Instructions (Signed)
Please look at your blue sheet in case you have questions about your surgery.

## 2016-03-07 NOTE — Progress Notes (Addendum)
ADDENDUM: The patient has a loose tooth and is going to have it removed on 2/8.  I discussed with the patient that she have this removed prior to our surgery as the tooth could become dislodged with intubation and can be pushed down the airway.  She will have it removed first on 2/8 and we'll schedule her surgery for 03/27/16.  She also has an umbilical hernia and will do a laparoscopic cholecystectomy with umbilical hernia repair.  Patient is in agreement with this plan and all of her questions have been answered.   Melvyn Neth, [facility]    Outpatient Surgical Follow Up  03/07/2016  Lorraine Turner is an 56 y.o. female.   Chief Complaint  Patient presents with  . Follow-up    Discuss rescheduling her surgery    HPI: 56 year old female returns to clinic for follow up for his cholelithiasis. Patient was initially scheduled for surgery in November and it was cancelled due to her diabetes. The patient states that since 2012 she's had intermittent epigastric to right upper quadrant pain. Patient states that the pain comes on about 8 out of 10. She notices it more with greasy and fatty foods. Patient states that this pain will come on about 30 minutes after she eats sometimes it will  last for 30 minutes and sometimes it'll last for much longer. Patient states that the pain starts in her epigastrium feels like it's going straight through to her back and sometimes wrapping around her right side. Patient has had some nausea and vomiting previously with this. Patient has not had any diarrhea or constipation. Patient states that it does decrease her appetite as well. She has noticed some chills whenever the pain is occurring as well but has not had any true fever. Patient has had a C-section in the past as well as an operation near her umbilicus previously she is unsure what that was for. Otherwise the patient today denies any fever chills nausea vomiting but she does  endorse some epigastric and right-sided pain and some constipation. She has received new medical clearance and is here to reschedule her surgery.  Past Medical History:  Diagnosis Date  . Anxiety   . Bipolar 1 disorder ([place])   . Depression   . Diabetes mellitus without complication ([place])   . Gallstones   . Hypertension   . Schizophrenia ([place])   . Seizures ([place])    X1- febrile seizure as a child-none since    Past Surgical History:  Procedure Laterality Date  . TONSILLECTOMY AND ADENOIDECTOMY     age 63    Family History  Problem Relation Age of Onset  . Diabetes Mother   . Heart disease Mother   . Kidney disease Mother   . Stroke Mother   . Hyperlipidemia Mother   . Diabetes Maternal Aunt     Social History:  reports that she has been smoking Cigarettes.  She has never used smokeless tobacco. She reports that she drinks alcohol. She reports that she does not use drugs.  Allergies: No Known Allergies  Medications reviewed.    ROS A multi-point ROS was completed and documented in the HPI   There were no vitals taken for this visit.  Physical Exam Gen : NAD Resp: CTA CV: RRR ABD: Soft, NT, ND, +BS Ext: MAEW Psych: Flight of ideas during visit.    No results found for this or any previous visit (from the past 48 hour(s)). No results found.  Assessment/Plan:  1. Symptomatic cholelithiasis 56 year old female with symptomatic cholelithiasis. I discussed the procedure in detail.  The patient was given Neurosurgeon.  We discussed the risks and benefits of a laparoscopic cholecystectomy and possible cholangiogram including, but not limited to bleeding, infection, injury to surrounding structures such as the intestine or liver, bile leak, retained gallstones, need to convert to an open procedure, prolonged diarrhea, blood clots such as  DVT, common bile duct injury, anesthesia risks, and possible need for additional procedures.  The likelihood of improvement  in symptoms and return to the patient's normal status is good. We discussed the typical post-operative recovery course.She wants surgery as soon as possible. Plan for surgery with Dr. Hampton Abbot on Feb 7th.  A total of 25 minutes was used on this encounter with greater than 50% for counseling and coordination of care.      Clayburn Pert, MD FACS General Surgeon  03/07/2016,3:17 PM

## 2016-03-11 ENCOUNTER — Telehealth: Payer: Self-pay | Admitting: Surgery

## 2016-03-11 NOTE — Telephone Encounter (Signed)
Pt advised of pre op date/time and sx date. Sx: 03/27/16 with Dr Audry Riles cholecystectomy.  Pre op: 03/20/16 @ 11:45am--office.   Patient made aware to call [phone], between 1-3:00pm the day before surgery, to find out what time to arrive.

## 2016-03-20 ENCOUNTER — Encounter
Admission: RE | Admit: 2016-03-20 | Discharge: 2016-03-20 | Disposition: A | Discharge status: Home / Self Care | Payer: Self-pay | Source: Ambulatory Visit | Attending: Surgery | Admitting: Surgery

## 2016-03-20 DIAGNOSIS — K8020 Calculus of gallbladder without cholecystitis without obstruction: Secondary | ICD-10-CM | POA: Insufficient documentation

## 2016-03-20 DIAGNOSIS — Z01812 Encounter for preprocedural laboratory examination: Secondary | ICD-10-CM | POA: Insufficient documentation

## 2016-03-20 HISTORY — DX: Polyneuropathy, unspecified: G62.9

## 2016-03-20 HISTORY — DX: Hyperlipidemia, unspecified: E78.5

## 2016-03-20 LAB — BASIC METABOLIC PANEL
Anion gap: 5 (ref 5–15)
BUN: 12 mg/dL (ref 6–20)
CHLORIDE: 104 mmol/L (ref 101–111)
CO2: 31 mmol/L (ref 22–32)
Calcium: 9.6 mg/dL (ref 8.9–10.3)
Creatinine, Ser: 0.7 mg/dL (ref 0.44–1.00)
GFR calc Af Amer: >60 mL/min (ref >60)
GLUCOSE: 92 mg/dL (ref 65–99)
POTASSIUM: 3.5 mmol/L (ref 3.5–5.1)
Sodium: 140 mmol/L (ref 135–145)

## 2016-03-20 NOTE — Pre-Procedure Instructions (Signed)
Kerin Perna, RN (anesthesia nurse) reviewed EKG from September and October 2017 and stated EKG were okay and no need to be repeated for upcoming surgery.

## 2016-03-20 NOTE — Patient Instructions (Signed)
  Your procedure is scheduled on: March 27 2016 (Wednesday) Report to Same Day Surgery 2nd floor medical mall [facility] Entrance-take elevator on left to 2nd floor.  Check in with surgery information desk.) To find out your arrival time please call [phone] between 1PM - 3PM on March 26, 2016 (Tuesday)  Remember: Instructions that are not followed completely may result in serious medical risk, up to and including death, or upon the discretion of your surgeon and anesthesiologist your surgery may need to be rescheduled.    _x___ 1. Do not eat food or drink liquids after midnight. No gum chewing or hard candies.     __x__ 2. No Alcohol for 24 hours before or after surgery.   __x__3. No Smoking for 24 prior to surgery.   ____  4. Bring all medications with you on the day of surgery if instructed.    __x__ 5. Notify your doctor if there is any change in your medical condition     (cold, fever, infections).     Do not wear jewelry, make-up, hairpins, clips or nail polish.  Do not wear lotions, powders, or perfumes. You may wear deodorant.  Do not shave 48 hours prior to surgery. Men may shave face and neck.  Do not bring valuables to the hospital.    [facility] is not responsible for any belongings or valuables.               Contacts, dentures or bridgework may not be worn into surgery.  Leave your suitcase in the car. After surgery it may be brought to your room.  For patients admitted to the hospital, discharge time is determined by your treatment team.   Patients discharged the day of surgery will not be allowed to drive home.  You will need someone to drive you home and stay with you the night of your procedure.    Please read over the following fact sheets that you were given:   [facility] Preparing for Surgery and or MRSA Information   _x___ Take these medicines the morning of surgery with A SIP OF WATER:    1. Lisinopril  2. Buspar  3. Gabapentin  4.  Zoloftx  5.  6.  ____Fleets enema or Magnesium Citrate as directed.   _x___ Use CHG Soap or sage wipes as directed on instruction sheet   ____ Use inhalers on the day of surgery and bring to hospital day of surgery  _x___ Stop metformin 2 days prior to surgery (Stop Metformin on February 12 )    _x_ Take 1/2 of usual insulin dose the night before surgery and none on the morning of surgery (Take one-half dose of Lantus on February 13, and no insulin or diabetes medication the morning of surgery)            ____ Stop Aspirin, Coumadin, Pllavix ,Eliquis, Effient, or Pradaxa  x__ Stop Anti-inflammatories such as Advil, Aleve, Ibuprofen, Motrin, Naproxen,          Naprosyn, Goodies powders or aspirin products now. Ok to take Tylenol or Tramadol for pain if needed.    ____ Stop supplements until after surgery.    ____ Bring C-Pap to the hospital.

## 2016-03-20 NOTE — Addendum Note (Signed)
Addended by: Riki Sheer on: 03/20/2016 09:04 AM   Modules accepted: Orders, SmartSet

## 2016-03-27 ENCOUNTER — Ambulatory Visit: Payer: Medicaid Other | Admitting: Anesthesiology

## 2016-03-27 ENCOUNTER — Encounter: Payer: Self-pay | Admitting: *Deleted

## 2016-03-27 ENCOUNTER — Encounter
Admission: RE | Disposition: A | Discharge status: Home / Self Care | Payer: Self-pay | Source: Ambulatory Visit | Attending: Surgery

## 2016-03-27 ENCOUNTER — Ambulatory Visit
Admission: RE | Admit: 2016-03-27 | Discharge: 2016-03-27 | Disposition: A | Discharge status: Home / Self Care | Payer: Medicaid Other | Source: Ambulatory Visit | Attending: Surgery | Admitting: Surgery

## 2016-03-27 DIAGNOSIS — F209 Schizophrenia, unspecified: Secondary | ICD-10-CM | POA: Insufficient documentation

## 2016-03-27 DIAGNOSIS — K429 Umbilical hernia without obstruction or gangrene: Secondary | ICD-10-CM | POA: Insufficient documentation

## 2016-03-27 DIAGNOSIS — I10 Essential (primary) hypertension: Secondary | ICD-10-CM | POA: Insufficient documentation

## 2016-03-27 DIAGNOSIS — E119 Type 2 diabetes mellitus without complications: Secondary | ICD-10-CM | POA: Diagnosis not present

## 2016-03-27 DIAGNOSIS — F17210 Nicotine dependence, cigarettes, uncomplicated: Secondary | ICD-10-CM | POA: Diagnosis not present

## 2016-03-27 DIAGNOSIS — F319 Bipolar disorder, unspecified: Secondary | ICD-10-CM | POA: Diagnosis not present

## 2016-03-27 DIAGNOSIS — K8010 Calculus of gallbladder with chronic cholecystitis without obstruction: Secondary | ICD-10-CM | POA: Diagnosis not present

## 2016-03-27 DIAGNOSIS — Z794 Long term (current) use of insulin: Secondary | ICD-10-CM | POA: Diagnosis not present

## 2016-03-27 DIAGNOSIS — K8020 Calculus of gallbladder without cholecystitis without obstruction: Secondary | ICD-10-CM

## 2016-03-27 DIAGNOSIS — K5900 Constipation, unspecified: Secondary | ICD-10-CM | POA: Insufficient documentation

## 2016-03-27 DIAGNOSIS — F419 Anxiety disorder, unspecified: Secondary | ICD-10-CM | POA: Insufficient documentation

## 2016-03-27 HISTORY — PX: CHOLECYSTECTOMY: SHX55

## 2016-03-27 LAB — GLUCOSE, CAPILLARY
GLUCOSE-CAPILLARY: 70 mg/dL (ref 65–99)
GLUCOSE-CAPILLARY: 133 mg/dL — AB (ref 65–99)

## 2016-03-27 SURGERY — LAPAROSCOPIC CHOLECYSTECTOMY
Anesthesia: General | Wound class: Clean Contaminated

## 2016-03-27 MED ORDER — ONDANSETRON HCL 4 MG/2ML IJ SOLN: 4.0000 mg | Freq: Once | INTRAMUSCULAR | Status: DC | PRN

## 2016-03-27 MED ORDER — EPHEDRINE SULFATE 50 MG/ML IJ SOLN
INTRAMUSCULAR | Status: DC | PRN
  Administered 2016-03-27: 10 mg via INTRAVENOUS

## 2016-03-27 MED ORDER — ROCURONIUM BROMIDE 50 MG/5ML IV SOLN
INTRAVENOUS | Status: AC
  Filled 2016-03-27: qty 1

## 2016-03-27 MED ORDER — MIDAZOLAM HCL 2 MG/2ML IJ SOLN
INTRAMUSCULAR | Status: AC
  Filled 2016-03-27: qty 2

## 2016-03-27 MED ORDER — FENTANYL CITRATE (PF) 100 MCG/2ML IJ SOLN
INTRAMUSCULAR | Status: DC | PRN
  Administered 2016-03-27 (×2): 50 ug via INTRAVENOUS

## 2016-03-27 MED ORDER — FENTANYL CITRATE (PF) 100 MCG/2ML IJ SOLN
INTRAMUSCULAR | Status: AC
  Administered 2016-03-27: 25 ug via INTRAVENOUS
  Filled 2016-03-27: qty 2

## 2016-03-27 MED ORDER — CEFAZOLIN SODIUM-DEXTROSE 2-4 GM/100ML-% IV SOLN
INTRAVENOUS | Status: AC
  Filled 2016-03-27: qty 100

## 2016-03-27 MED ORDER — BUPIVACAINE-EPINEPHRINE 0.25% -1:200000 IJ SOLN
INTRAMUSCULAR | Status: DC | PRN
  Administered 2016-03-27: 20 mL

## 2016-03-27 MED ORDER — BUPIVACAINE-EPINEPHRINE (PF) 0.25% -1:200000 IJ SOLN
INTRAMUSCULAR | Status: AC
  Filled 2016-03-27: qty 30

## 2016-03-27 MED ORDER — ACETAMINOPHEN 500 MG PO TABS
ORAL_TABLET | ORAL | Status: AC
  Filled 2016-03-27: qty 2

## 2016-03-27 MED ORDER — PROPOFOL 10 MG/ML IV BOLUS
INTRAVENOUS | Status: AC
  Filled 2016-03-27: qty 20

## 2016-03-27 MED ORDER — LIDOCAINE HCL (CARDIAC) 20 MG/ML IV SOLN
INTRAVENOUS | Status: DC | PRN
  Administered 2016-03-27: 60 mg via INTRAVENOUS

## 2016-03-27 MED ORDER — PHENYLEPHRINE HCL 10 MG/ML IJ SOLN
INTRAMUSCULAR | Status: DC | PRN
  Administered 2016-03-27: 100 ug via INTRAVENOUS

## 2016-03-27 MED ORDER — FAMOTIDINE 20 MG PO TABS
ORAL_TABLET | ORAL | Status: AC
  Filled 2016-03-27: qty 1

## 2016-03-27 MED ORDER — CHLORHEXIDINE GLUCONATE CLOTH 2 % EX PADS: 6.0000 | MEDICATED_PAD | Freq: Once | CUTANEOUS | Status: DC

## 2016-03-27 MED ORDER — MIDAZOLAM HCL 2 MG/2ML IJ SOLN
INTRAMUSCULAR | Status: DC | PRN
  Administered 2016-03-27: 2 mg via INTRAVENOUS

## 2016-03-27 MED ORDER — OXYCODONE HCL 5 MG PO TABS
5.0000 mg | ORAL_TABLET | Freq: Four times a day (QID) | ORAL | 0 refills | Status: DC | PRN
Start: 2016-03-27 — End: 2016-04-09

## 2016-03-27 MED ORDER — ACETAMINOPHEN 500 MG PO TABS
1000.0000 mg | ORAL_TABLET | ORAL | Status: AC
  Administered 2016-03-27: 1000 mg via ORAL

## 2016-03-27 MED ORDER — FENTANYL CITRATE (PF) 100 MCG/2ML IJ SOLN
INTRAMUSCULAR | Status: AC
  Filled 2016-03-27: qty 2

## 2016-03-27 MED ORDER — PROPOFOL 10 MG/ML IV BOLUS
INTRAVENOUS | Status: DC | PRN
  Administered 2016-03-27: 130 mg via INTRAVENOUS

## 2016-03-27 MED ORDER — SUGAMMADEX SODIUM 200 MG/2ML IV SOLN
INTRAVENOUS | Status: DC | PRN
  Administered 2016-03-27: 150 mg via INTRAVENOUS

## 2016-03-27 MED ORDER — LIDOCAINE HCL (PF) 2 % IJ SOLN
INTRAMUSCULAR | Status: AC
  Filled 2016-03-27: qty 2

## 2016-03-27 MED ORDER — ROCURONIUM BROMIDE 100 MG/10ML IV SOLN
INTRAVENOUS | Status: DC | PRN
  Administered 2016-03-27: 40 mg via INTRAVENOUS

## 2016-03-27 MED ORDER — FAMOTIDINE 20 MG PO TABS
20.0000 mg | ORAL_TABLET | Freq: Once | ORAL | Status: AC
  Administered 2016-03-27: 20 mg via ORAL

## 2016-03-27 MED ORDER — ONDANSETRON HCL 4 MG/2ML IJ SOLN
INTRAMUSCULAR | Status: AC
  Filled 2016-03-27: qty 2

## 2016-03-27 MED ORDER — SODIUM CHLORIDE 0.9 % IV SOLN
INTRAVENOUS | Status: DC
  Administered 2016-03-27: 11:00:00 via INTRAVENOUS

## 2016-03-27 MED ORDER — LACTATED RINGERS IV SOLN
INTRAVENOUS | Status: DC | PRN
  Administered 2016-03-27: 13:00:00 via INTRAVENOUS

## 2016-03-27 MED ORDER — ONDANSETRON HCL 4 MG/2ML IJ SOLN
INTRAMUSCULAR | Status: DC | PRN
  Administered 2016-03-27: 4 mg via INTRAVENOUS

## 2016-03-27 MED ORDER — FENTANYL CITRATE (PF) 100 MCG/2ML IJ SOLN
25.0000 ug | INTRAMUSCULAR | Status: DC | PRN
  Administered 2016-03-27 (×2): 25 ug via INTRAVENOUS

## 2016-03-27 MED ORDER — CEFAZOLIN SODIUM-DEXTROSE 2-4 GM/100ML-% IV SOLN
2.0000 g | INTRAVENOUS | Status: AC
  Administered 2016-03-27: 2 g via INTRAVENOUS

## 2016-03-27 SURGICAL SUPPLY — 48 items
ADH SKN CLS APL DERMABOND .7 (GAUZE/BANDAGES/DRESSINGS) ×1
APPLIER CLIP 5 13 M/L LIGAMAX5 (MISCELLANEOUS) ×2
APR CLP MED LRG 5 ANG JAW (MISCELLANEOUS) ×1
BLADE SURG 15 STRL LF DISP TIS (BLADE) ×1 IMPLANT
BLADE SURG 15 STRL SS (BLADE) ×2
CANISTER SUCT 1200ML W/VALVE (MISCELLANEOUS) ×2 IMPLANT
CATH CHOLANGI 4FR 420404F (CATHETERS) IMPLANT
CHLORAPREP W/TINT 26ML (MISCELLANEOUS) ×2 IMPLANT
CLIP APPLIE 5 13 M/L LIGAMAX5 (MISCELLANEOUS) ×1 IMPLANT
CONRAY 60ML FOR OR (MISCELLANEOUS) ×2 IMPLANT
DERMABOND ADVANCED (GAUZE/BANDAGES/DRESSINGS) ×1
DERMABOND ADVANCED .7 DNX12 (GAUZE/BANDAGES/DRESSINGS) ×1 IMPLANT
DRAPE C-ARM XRAY 36X54 (DRAPES) IMPLANT
ELECT REM PT RETURN 9FT ADLT (ELECTROSURGICAL) ×2
ELECTRODE REM PT RTRN 9FT ADLT (ELECTROSURGICAL) ×1 IMPLANT
ENDOPOUCH RETRIEVER 10 (MISCELLANEOUS) ×2 IMPLANT
FILTER LAP SMOKE EVAC STRL (MISCELLANEOUS) ×2 IMPLANT
GLOVE SURG SYN 7.0 (GLOVE) ×2 IMPLANT
GLOVE SURG SYN 7.0 PF PI (GLOVE) ×1 IMPLANT
GLOVE SURG SYN 7.5  E (GLOVE) ×1
GLOVE SURG SYN 7.5 E (GLOVE) ×1 IMPLANT
GLOVE SURG SYN 7.5 PF PI (GLOVE) ×1 IMPLANT
GOWN STRL REUS W/ TWL LRG LVL3 (GOWN DISPOSABLE) ×3 IMPLANT
GOWN STRL REUS W/TWL LRG LVL3 (GOWN DISPOSABLE) ×6
IRRIGATION STRYKERFLOW (MISCELLANEOUS) IMPLANT
IRRIGATOR STRYKERFLOW (MISCELLANEOUS)
IV CATH ANGIO 12GX3 LT BLUE (NEEDLE) ×2 IMPLANT
IV NS 1000ML (IV SOLUTION)
IV NS 1000ML BAXH (IV SOLUTION) IMPLANT
JACKSON PRATT 10 (INSTRUMENTS) IMPLANT
L-HOOK LAP DISP 36CM (ELECTROSURGICAL) ×2
LABEL OR SOLS (LABEL) ×2 IMPLANT
LHOOK LAP DISP 36CM (ELECTROSURGICAL) ×1 IMPLANT
LIQUID BAND (GAUZE/BANDAGES/DRESSINGS) ×1 IMPLANT
NDL SAFETY 22GX1.5 (NEEDLE) ×2 IMPLANT
NEEDLE HYPO 22GX1.5 SAFETY (NEEDLE) ×2 IMPLANT
PACK LAP CHOLECYSTECTOMY (MISCELLANEOUS) ×2 IMPLANT
PENCIL ELECTRO HAND CTR (MISCELLANEOUS) ×2 IMPLANT
SCISSORS METZENBAUM CVD 33 (INSTRUMENTS) ×2 IMPLANT
SLEEVE ADV FIXATION 5X100MM (TROCAR) ×6 IMPLANT
SPONGE VERSALON 4X4 4PLY (MISCELLANEOUS) IMPLANT
SUT MNCRL 4-0 (SUTURE) ×2
SUT MNCRL 4-0 27XMFL (SUTURE) ×1
SUT VICRYL 0 AB UR-6 (SUTURE) ×2 IMPLANT
SUTURE MNCRL 4-0 27XMF (SUTURE) ×1 IMPLANT
TROCAR 130MM GELPORT  DAV (MISCELLANEOUS) ×2 IMPLANT
TROCAR Z-THREAD OPTICAL 5X100M (TROCAR) ×2 IMPLANT
TUBING INSUFFLATOR HI FLOW (MISCELLANEOUS) ×2 IMPLANT

## 2016-03-27 NOTE — Anesthesia Procedure Notes (Signed)
Procedure Name: Intubation Date/Time: 03/27/2016 12:31 PM Performed by: Allean Found Pre-anesthesia Checklist: Patient identified, Emergency Drugs available, Suction available, Patient being monitored and Timeout performed Patient Re-evaluated:Patient Re-evaluated prior to inductionOxygen Delivery Method: Circle system utilized, Simple face mask, Non-rebreather mask, Nasal cannula and Ambu bag Preoxygenation: Pre-oxygenation with 100% oxygen Intubation Type: IV induction Ventilation: Mask ventilation without difficulty Laryngoscope Size: Mac and 3 Grade View: Grade I Tube type: Oral Tube size: 7.0 mm Number of attempts: 2 Airway Equipment and Method: Stylet Placement Confirmation: ETT inserted through vocal cords under direct vision and positive ETCO2 Secured at: 23 cm Tube secured with: Tape Dental Injury: Teeth and Oropharynx as per pre-operative assessment

## 2016-03-27 NOTE — Interval H&P Note (Signed)
History and Physical Interval Note:  03/27/2016 6:03 PM  Lorraine Turner  has presented today for surgery, with the diagnosis of SYMPTOMATIC CHOLELITHIASIS  The various methods of treatment have been discussed with the patient and family. After consideration of risks, benefits and other options for treatment, the patient has consented to  Procedure(s): LAPAROSCOPIC CHOLECYSTECTOMY (N/A) as a surgical intervention .  The patient's history has been reviewed, patient examined, no change in status, stable for surgery.  I have reviewed the patient's chart and labs.  Questions were answered to the patient's satisfaction.     [name]

## 2016-03-27 NOTE — Anesthesia Preprocedure Evaluation (Signed)
Anesthesia Evaluation  Patient identified by MRN, date of birth, ID band Patient awake    Reviewed: Allergy & Precautions, NPO status , Patient's Chart, lab work & pertinent test results  Airway Mallampati: II  TM Distance: >3 FB     Dental  (+) Poor Dentition, Missing, Chipped   Pulmonary Current Smoker,    Pulmonary exam normal        Cardiovascular hypertension, Pt. on medications Normal cardiovascular exam     Neuro/Psych PSYCHIATRIC DISORDERS Anxiety Depression Schizophrenia  Neuromuscular disease    GI/Hepatic Neg liver ROS, hernia   Endo/Other  diabetes, Well Controlled, Type 2, Oral Hypoglycemic Agents, Insulin Dependent  Renal/GU      Musculoskeletal   Abdominal Normal abdominal exam  (+)   Peds  Hematology  (+) anemia ,   Anesthesia Other Findings Past Medical History: No date: Anxiety No date: Bipolar 1 disorder (HCC) No date: Depression No date: Diabetes mellitus without complication (HCC) No date: Gallstones No date: Hyperlipidemia No date: Hypertension No date: Neuropathy ([place]) No date: Schizophrenia ([place]) No date: Seizures ([place])     Comment: X1- febrile seizure as a child-none since  Reproductive/Obstetrics                            Anesthesia Physical Anesthesia Plan  ASA: III  Anesthesia Plan: General   Post-op Pain Management:    Induction: Intravenous  Airway Management Planned: Oral ETT  Additional Equipment:   Intra-op Plan:   Post-operative Plan: Extubation in OR  Informed Consent: I have reviewed the patients History and Physical, chart, labs and discussed the procedure including the risks, benefits and alternatives for the proposed anesthesia with the patient or authorized representative who has indicated his/her understanding and acceptance.   Dental advisory given  Plan Discussed with: CRNA and Surgeon  Anesthesia Plan Comments:          Anesthesia Quick Evaluation

## 2016-03-27 NOTE — Op Note (Signed)
  Procedure Date:  03/27/2016  Pre-operative Diagnosis:  Symptomatic cholelithiasis  Post-operative Diagnosis:  Symptomatic cholelithiasis  Procedure:  Laparoscopic cholecystectomy  Surgeon:  Melvyn Neth, MD  Anesthesia:  General endotracheal  Estimated Blood Loss:  10 ml  Specimens:  gallbladder  Complications:  None  Indications for Procedure:  This is a 56 y.o. female who presents with abdominal pain and workup revealing symptomatic cholelithiasis.  The benefits, complications, treatment options, and expected outcomes were discussed with the patient. The risks of bleeding, infection, recurrence of symptoms, failure to resolve symptoms, bile duct damage, bile duct leak, retained common bile duct stone, bowel injury, and need for further procedures were all discussed with the patient and she was willing to proceed.  Description of Procedure: The patient was correctly identified in the preoperative area and brought into the operating room.  The patient was placed supine with VTE prophylaxis in place.  Appropriate time-outs were performed.  Anesthesia was induced and the patient was intubated.  Appropriate antibiotics were infused.  The abdomen was prepped and draped in a sterile fashion. An infraumbilical incision was made. A cutdown technique was used to enter the abdominal cavity without injury, and a Hasson trocar was inserted.  Pneumoperitoneum was obtained with appropriate opening pressures.  A 5-mm port was placed in the subxiphoid area and two 5-mm ports were placed in the right upper quadrant under direct visualization.  The gallbladder was identified.  The fundus was grasped and retracted cephalad.  Adhesions were lysed bluntly and with electrocautery. The infundibulum was grasped and retracted laterally, exposing the peritoneum overlying the gallbladder.  This was incised with electrocautery and extended on either side of the gallbladder.  The cystic duct and anterior and  posterior branches of the cystic artery were clearly identified and bluntly dissected.  All were clipped twice proximally and once distally, cutting in between.  The gallbladder was taken from the gallbladder fossa in a retrograde fashion with electrocautery. The gallbladder was placed in an Endocatch bag and brought out via the umbilical incision. The liver bed was inspected and any bleeding was controlled with electrocautery. The right upper quadrant was then inspected again revealing intact clips, no bleeding, and no ductal injury.  The 5 mm ports were removed under direct visualization and the Hasson trocar was removed.  The fascial opening was closed using 0 vicryl suture.  Local anesthetic was infused in all incisions and the incisions were closed with 4-0 Monocryl.  The wounds were cleaned and sealed with DermaBond.  The patient was emerged from anesthesia and extubated and brought to the recovery room for further management.  The patient tolerated the procedure well and all counts were correct at the end of the case.   Melvyn Neth, MD

## 2016-03-27 NOTE — Anesthesia Post-op Follow-up Note (Cosign Needed)
Anesthesia QCDR form completed.        

## 2016-03-27 NOTE — H&P (View-Only) (Signed)
ADDENDUM: The patient has a loose tooth and is going to have it removed on 2/8.  I discussed with the patient that she have this removed prior to our surgery as the tooth could become dislodged with intubation and can be pushed down the airway.  She will have it removed first on 2/8 and we'll schedule her surgery for 03/27/16.  She also has an umbilical hernia and will do a laparoscopic cholecystectomy with umbilical hernia repair.  Patient is in agreement with this plan and all of her questions have been answered.   Melvyn Neth, [facility]    Outpatient Surgical Follow Up  03/07/2016  Lorraine Turner is an 56 y.o. female.   Chief Complaint  Patient presents with  . Follow-up    Discuss rescheduling her surgery    HPI: 56 year old female returns to clinic for follow up for his cholelithiasis. Patient was initially scheduled for surgery in November and it was cancelled due to her diabetes. The patient states that since 2012 she's had intermittent epigastric to right upper quadrant pain. Patient states that the pain comes on about 8 out of 10. She notices it more with greasy and fatty foods. Patient states that this pain will come on about 30 minutes after she eats sometimes it will  last for 30 minutes and sometimes it'll last for much longer. Patient states that the pain starts in her epigastrium feels like it's going straight through to her back and sometimes wrapping around her right side. Patient has had some nausea and vomiting previously with this. Patient has not had any diarrhea or constipation. Patient states that it does decrease her appetite as well. She has noticed some chills whenever the pain is occurring as well but has not had any true fever. Patient has had a C-section in the past as well as an operation near her umbilicus previously she is unsure what that was for. Otherwise the patient today denies any fever chills nausea vomiting but she does  endorse some epigastric and right-sided pain and some constipation. She has received new medical clearance and is here to reschedule her surgery.  Past Medical History:  Diagnosis Date  . Anxiety   . Bipolar 1 disorder ([place])   . Depression   . Diabetes mellitus without complication ([place])   . Gallstones   . Hypertension   . Schizophrenia ([place])   . Seizures ([place])    X1- febrile seizure as a child-none since    Past Surgical History:  Procedure Laterality Date  . TONSILLECTOMY AND ADENOIDECTOMY     age 4    Family History  Problem Relation Age of Onset  . Diabetes Mother   . Heart disease Mother   . Kidney disease Mother   . Stroke Mother   . Hyperlipidemia Mother   . Diabetes Maternal Aunt     Social History:  reports that she has been smoking Cigarettes.  She has never used smokeless tobacco. She reports that she drinks alcohol. She reports that she does not use drugs.  Allergies: No Known Allergies  Medications reviewed.    ROS A multi-point ROS was completed and documented in the HPI   There were no vitals taken for this visit.  Physical Exam Gen : NAD Resp: CTA CV: RRR ABD: Soft, NT, ND, +BS Ext: MAEW Psych: Flight of ideas during visit.    No results found for this or any previous visit (from the past 48 hour(s)). No results found.  Assessment/Plan:  1. Symptomatic cholelithiasis 56 year old female with symptomatic cholelithiasis. I discussed the procedure in detail.  The patient was given Neurosurgeon.  We discussed the risks and benefits of a laparoscopic cholecystectomy and possible cholangiogram including, but not limited to bleeding, infection, injury to surrounding structures such as the intestine or liver, bile leak, retained gallstones, need to convert to an open procedure, prolonged diarrhea, blood clots such as  DVT, common bile duct injury, anesthesia risks, and possible need for additional procedures.  The likelihood of improvement  in symptoms and return to the patient's normal status is good. We discussed the typical post-operative recovery course.She wants surgery as soon as possible. Plan for surgery with Dr. Hampton Abbot on Feb 7th.  A total of 25 minutes was used on this encounter with greater than 50% for counseling and coordination of care.      Clayburn Pert, MD FACS General Surgeon  03/07/2016,3:17 PM

## 2016-03-27 NOTE — Transfer of Care (Signed)
Immediate Anesthesia Transfer of Care Note  Patient: Lorraine Turner  Procedure(s) Performed: Procedure(s): LAPAROSCOPIC CHOLECYSTECTOMY (N/A)  Patient Location: PACU  Anesthesia Type:General  Level of Consciousness: sedated  Airway & Oxygen Therapy: Patient Spontanous Breathing and Patient connected to face mask oxygen  Post-op Assessment: Report given to RN and Post -op Vital signs reviewed and stable  Post vital signs: Reviewed and stable  Last Vitals:  Vitals:   03/27/16 1106  BP: 112/66  Pulse: 64  Resp: 16  Temp: 36.4 C    Last Pain:  Vitals:   03/27/16 1106  TempSrc: Oral         Complications: No apparent anesthesia complications

## 2016-03-28 ENCOUNTER — Encounter: Payer: Self-pay | Admitting: Surgery

## 2016-03-28 NOTE — Anesthesia Postprocedure Evaluation (Signed)
Anesthesia Post Note  Patient: Lorraine Turner  Procedure(s) Performed: Procedure(s) (LRB): LAPAROSCOPIC CHOLECYSTECTOMY (N/A)  Patient location during evaluation: PACU Anesthesia Type: General Level of consciousness: awake and alert and oriented Pain management: pain level controlled Vital Signs Assessment: post-procedure vital signs reviewed and stable Respiratory status: spontaneous breathing Cardiovascular status: blood pressure returned to baseline Anesthetic complications: no     Last Vitals:  Vitals:   03/27/16 1510 03/27/16 1516  BP: (!) 155/70 (!) 163/73  Pulse: 61 64  Resp: 16 16  Temp: 36.1 C 36.4 C    Last Pain:  Vitals:   03/27/16 1516  TempSrc: Oral  PainSc:                  Albaraa Swingle

## 2016-03-29 LAB — SURGICAL PATHOLOGY

## 2016-04-02 ENCOUNTER — Telehealth: Payer: Self-pay | Admitting: Family Medicine

## 2016-04-02 NOTE — Telephone Encounter (Signed)
Patient called the office to speak with nurse regarding her current condition. Pt just had surgery. Pt would also like a referral to be placed to see a nutritionist. Please follow up.  Thank you.

## 2016-04-04 NOTE — Telephone Encounter (Signed)
Scheduler to call patient and set up an appt for patient for diabetes f/u and nutrition referral.

## 2016-04-04 NOTE — Telephone Encounter (Signed)
She needs a follow-up office visit for her diabetes and then we can discuss a nutrition referral referral if indicated.

## 2016-04-08 ENCOUNTER — Other Ambulatory Visit: Payer: Self-pay

## 2016-04-09 ENCOUNTER — Telehealth: Payer: Self-pay

## 2016-04-09 ENCOUNTER — Encounter: Payer: Self-pay | Admitting: Family Medicine

## 2016-04-09 ENCOUNTER — Ambulatory Visit: Payer: Self-pay | Attending: Family Medicine | Admitting: Family Medicine

## 2016-04-09 ENCOUNTER — Encounter: Payer: Self-pay | Admitting: Licensed Clinical Social Worker

## 2016-04-09 VITALS — BP 130/75 | HR 69 | Temp 98.2°F | Ht 62.0 in | Wt 121.0 lb

## 2016-04-09 DIAGNOSIS — Z79899 Other long term (current) drug therapy: Secondary | ICD-10-CM | POA: Insufficient documentation

## 2016-04-09 DIAGNOSIS — E785 Hyperlipidemia, unspecified: Secondary | ICD-10-CM | POA: Insufficient documentation

## 2016-04-09 DIAGNOSIS — E1149 Type 2 diabetes mellitus with other diabetic neurological complication: Secondary | ICD-10-CM

## 2016-04-09 DIAGNOSIS — E0800 Diabetes mellitus due to underlying condition with hyperosmolarity without nonketotic hyperglycemic-hyperosmolar coma (NKHHC): Secondary | ICD-10-CM

## 2016-04-09 DIAGNOSIS — I10 Essential (primary) hypertension: Secondary | ICD-10-CM | POA: Insufficient documentation

## 2016-04-09 DIAGNOSIS — Z794 Long term (current) use of insulin: Secondary | ICD-10-CM | POA: Insufficient documentation

## 2016-04-09 DIAGNOSIS — E1100 Type 2 diabetes mellitus with hyperosmolarity without nonketotic hyperglycemic-hyperosmolar coma (NKHHC): Secondary | ICD-10-CM | POA: Insufficient documentation

## 2016-04-09 DIAGNOSIS — F319 Bipolar disorder, unspecified: Secondary | ICD-10-CM | POA: Insufficient documentation

## 2016-04-09 DIAGNOSIS — F419 Anxiety disorder, unspecified: Secondary | ICD-10-CM | POA: Insufficient documentation

## 2016-04-09 DIAGNOSIS — E1140 Type 2 diabetes mellitus with diabetic neuropathy, unspecified: Secondary | ICD-10-CM | POA: Insufficient documentation

## 2016-04-09 DIAGNOSIS — F251 Schizoaffective disorder, depressive type: Secondary | ICD-10-CM | POA: Insufficient documentation

## 2016-04-09 DIAGNOSIS — E7800 Pure hypercholesterolemia, unspecified: Secondary | ICD-10-CM | POA: Insufficient documentation

## 2016-04-09 LAB — LIPID PANEL
Cholesterol: 143 mg/dL (ref <200)
HDL: 54 mg/dL (ref >50)
LDL CALC: 80 mg/dL (ref <100)
TRIGLYCERIDES: 43 mg/dL (ref <150)
Total CHOL/HDL Ratio: 2.6 Ratio (ref <5.0)
VLDL: 9 mg/dL (ref <30)

## 2016-04-09 LAB — COMPLETE METABOLIC PANEL WITH GFR
ALBUMIN: 4.2 g/dL (ref 3.6–5.1)
ALK PHOS: 63 U/L (ref 33–130)
ALT: 12 U/L (ref 6–29)
AST: 11 U/L (ref 10–35)
BILIRUBIN TOTAL: 0.5 mg/dL (ref 0.2–1.2)
BUN: 13 mg/dL (ref 7–25)
CALCIUM: 9.5 mg/dL (ref 8.6–10.4)
CO2: 26 mmol/L (ref 20–31)
CREATININE: 0.65 mg/dL (ref 0.50–1.05)
Chloride: 104 mmol/L (ref 98–110)
Glucose, Bld: 46 mg/dL — ABNORMAL LOW (ref 65–99)
Potassium: 3.8 mmol/L (ref 3.5–5.3)
Sodium: 141 mmol/L (ref 135–146)
TOTAL PROTEIN: 7.4 g/dL (ref 6.1–8.1)

## 2016-04-09 LAB — GLUCOSE, POCT (MANUAL RESULT ENTRY): POC GLUCOSE: 54 mg/dL — AB (ref 70–99)

## 2016-04-09 MED ORDER — ONE-DAILY MULTI VITAMINS PO TABS: 1.0000 | ORAL_TABLET | Freq: Every day | ORAL | 3 refills | Status: DC

## 2016-04-09 MED ORDER — GLIPIZIDE 10 MG PO TABS: 10.0000 mg | ORAL_TABLET | Freq: Two times a day (BID) | ORAL | 3 refills | Status: DC

## 2016-04-09 MED ORDER — SERTRALINE HCL 100 MG PO TABS: 100.0000 mg | ORAL_TABLET | Freq: Every day | ORAL | 0 refills | Status: DC

## 2016-04-09 MED ORDER — SIMVASTATIN 20 MG PO TABS: 20.0000 mg | ORAL_TABLET | Freq: Every day | ORAL | 3 refills | Status: DC

## 2016-04-09 MED ORDER — METFORMIN HCL 1000 MG PO TABS: 1000.0000 mg | ORAL_TABLET | Freq: Two times a day (BID) | ORAL | 3 refills | Status: DC

## 2016-04-09 MED ORDER — TRAZODONE HCL 50 MG PO TABS: 50.0000 mg | ORAL_TABLET | Freq: Every evening | ORAL | 0 refills | Status: DC | PRN

## 2016-04-09 MED ORDER — BUSPIRONE HCL 7.5 MG PO TABS: 7.5000 mg | ORAL_TABLET | Freq: Two times a day (BID) | ORAL | 0 refills | Status: DC

## 2016-04-09 MED ORDER — TRUEPLUS LANCETS 28G MISC: 1.0000 | Freq: Three times a day (TID) | 12 refills | Status: DC

## 2016-04-09 MED ORDER — GABAPENTIN 300 MG PO CAPS: 300.0000 mg | ORAL_CAPSULE | Freq: Three times a day (TID) | ORAL | 3 refills | Status: DC

## 2016-04-09 MED ORDER — LISINOPRIL 20 MG PO TABS: 20.0000 mg | ORAL_TABLET | Freq: Every day | ORAL | 3 refills | Status: DC

## 2016-04-09 MED ORDER — INSULIN GLARGINE 100 UNIT/ML ~~LOC~~ SOLN: 15.0000 [IU] | Freq: Every day | SUBCUTANEOUS | 3 refills | Status: DC

## 2016-04-09 MED FILL — SERTRALINE HCL 100 MG TAB: 100 | 30 days supply | Qty: 30 | Fill #0

## 2016-04-09 MED FILL — metFORMIN HCL 1000 MG TABS: 1000 | 30 days supply | Qty: 60 | Fill #2

## 2016-04-09 MED FILL — GABAPENTIN 300 MG CAPSULE: 300 | 30 days supply | Qty: 90 | Fill #0

## 2016-04-09 MED FILL — glipiZIDE 10 MG TABS: 10 | 30 days supply | Qty: 60 | Fill #0

## 2016-04-09 MED FILL — busPIRone HCL 7.5 MG TABS: 7.5 | 15 days supply | Qty: 30 | Fill #0

## 2016-04-09 NOTE — BH Specialist Note (Signed)
Session Start time: 11:30 AM   End Time: 12:00 PM Total Time:  30 minutes Type of Service: [place]: No.   Interpreter Name & Language: N/A # [facility] Visits July 2017-June 2018: 1st   SUBJECTIVE: Lorraine Turner is a 56 y.o. female  Pt. was referred by Dr. Jarold Song for:  depression. Pt. reports the following symptoms/concerns: overwhelming feelings of sadness, low energy, difficulty sleeping, decreased appetite, inability to concentrate, and irritability Duration of problem:  Ongoing Pt has been diagnosed with Schizoaffective Disorder with Depression Severity: moderately severe Previous treatment: Pt received medication management with [facility]. She reports plans to go to [facility] by 04/10/16 to re-establish services, in addition, to contacting [facility] to continue case management services.    OBJECTIVE: Mood: Pleasant & Affect: Depressed Risk of harm to self or others: Pt denied SI/HI/AVH Assessments administered: PHQ-9; GAD-7  LIFE CONTEXT:  Family & Social: Pt resides with spouse and two minor children. She receives emotional support from friends School/ Work: Pt receives food stamps [phone]) and the [employer] Self-Care: No report of substance use Life changes: Pt underwent surgery on 03/27/16 to have gallbladder removed. She recently moved back in with spouse after being separated for four years, to assist in caring for him due to his recent diagnosis of end stage cancer What is important to pt/family (values): Family, Spirituality, Good Health, Independence   GOALS ADDRESSED:  Decrease symptoms of depression Increase knowledge of coping skills Increase adequate support system for patient and family  INTERVENTIONS: Solution Focused, Strength-based and Supportive   ASSESSMENT:  Pt currently experiencing depression triggered by caring for husband who has end-stage lung cancer. Pt reports   overwhelming feelings of sadness, low energy,  difficulty sleeping, decreased appetite, inability to concentrate, and irritability. Pt may benefit from psychotherapy and medication management. Pt received medication management with [facility]. She reports plans to go to [facility] by 04/10/16 to re-establish services. LCSWA discussed benefits of applying additional healthy coping skills to decrease symptoms and pt identified coping strategies to utilize on a daily basis. Pt is knowledgeable about hospice services to assist in providing care to spouse. LCSWA provided pt resources on labor and transportation (bus pass). Pt reported that she will contact [facility] to continue case management services with Lorraine Turner.      PLAN: 1. F/U with behavioral health clinician: Pt was encouraged to contact [facility] if symptoms worsen or fail to improve to schedule behavioral appointments at [facility]. 2. Behavioral Health meds: Zoloft, Buspar, and Desyrel 3. Behavioral recommendations: LCSWA recommends that pt apply healthy coping skills discussed, re-initiate medication management through [facility], and case management services with [facility]. Pt is encouraged to schedule follow up appointment with LCSWA 4. Referral: Brief Counseling/Psychotherapy, [employer], Problem-solving teaching/coping strategies, Psychoeducation and Supportive Counseling 5. From scale of 1-10, how likely are you to follow plan: [place], MSW, [place] Worker 04/10/16 11:29 AM  Warmhandoff:   Warm Hand Off Completed.

## 2016-04-09 NOTE — Progress Notes (Signed)
Med refills- buspar, metformin, multiple vitamin, zoloft, trazadone, lancets

## 2016-04-09 NOTE — Progress Notes (Signed)
Subjective:  Patient ID: Lorraine Turner, female    DOB: [birth year]  Age: 56 y.o. MRN: [id number]  CC: Diabetes and Depression   HPI Lorraine Turner is a 56 year old female with a history of scabies affective disorder, hypertension, diabetic neuropathy, type 2 diabetes mellitus (A1c 8.4) from 01/2016) who presents today for follow-up visit.  She underwent laparoscopic cholecystectomy 2 weeks ago and reports doing well. She is concerned that she has not gained much weight and would like to see a nutritionist but also endorses.her appetite has significantly decreased when she had the gallstones.  Has been compliant with her diabetic medications and reports her blood sugars are in the 119 to 130 range. Denies hypoglycemia at home. She does have diabetic neuropathy which is controlled on gabapentin.   She previously received mental health care at [facility] and has not been there in a while; endorses being depressed at this time as she is currently with her husband who has end-stage lung cancer Center with 6     Past Medical History:  Diagnosis Date  . Anxiety   . Bipolar 1 disorder ([place])   . Depression   . Diabetes mellitus without complication ([place])   . Gallstones   . Hyperlipidemia   . Hypertension   . Neuropathy ([place])   . Schizophrenia ([place])   . Seizures ([place])    X1- febrile seizure as a child-none since    Past Surgical History:  Procedure Laterality Date  . CESAREAN SECTION  05/1998   X 1  . CHOLECYSTECTOMY N/A 03/27/2016   Procedure: LAPAROSCOPIC CHOLECYSTECTOMY;  Surgeon: Olean Ree, MD;  Location: [facility] ORS;  Service: General;  Laterality: N/A;  . TONSILLECTOMY AND ADENOIDECTOMY     age 63  . TUBAL LIGATION      No Known Allergies    Outpatient Medications Prior to Visit  Medication Sig Dispense Refill  . Blood Glucose Monitoring Suppl (TRUE METRIX METER) w/Device KIT 1 Device by Does not apply route 3 (three) times daily. (Patient taking differently: 1 Device  by Does not apply route daily. ) 1 kit 0  . ibuprofen (ADVIL) 200 MG tablet Take 200 mg by mouth every 6 (six) hours as needed.    . Insulin Syringe-Needle U-100 31G X 5/16" 0.3 ML MISC 1 each by Does not apply route 4 (four) times daily -  before meals and at bedtime. 100 each 12  . gabapentin (NEURONTIN) 300 MG capsule Take 1 capsule (300 mg total) by mouth 3 (three) times daily. 90 capsule 5  . glipiZIDE (GLUCOTROL) 10 MG tablet Take 1 tablet (10 mg total) by mouth 2 (two) times daily before a meal. 60 tablet 3  . insulin glargine (LANTUS) 100 UNIT/ML injection Inject 0.15 mLs (15 Units total) into the skin at bedtime. 10 mL 3  . lisinopril (PRINIVIL,ZESTRIL) 20 MG tablet Take 20 mg by mouth daily.    . metFORMIN (GLUCOPHAGE) 1000 MG tablet Take 1 tablet (1,000 mg total) by mouth 2 (two) times daily. 60 tablet 3  . Multiple Vitamin (MULTIVITAMIN) tablet Take 1 tablet by mouth daily. Reported on 08/30/2015    . simvastatin (ZOCOR) 20 MG tablet Take 1 tablet (20 mg total) by mouth daily at 6 PM. 30 tablet 3  . Naproxen Sodium (ALEVE PO) Take 1 tablet by mouth as needed.    . ondansetron (ZOFRAN ODT) 4 MG disintegrating tablet Take 1 tablet (4 mg total) by mouth every 8 (eight) hours as needed for nausea or vomiting. (  Patient not taking: Reported on 04/09/2016) 30 tablet 0  . QUEtiapine (SEROQUEL) 50 MG tablet Take 3 tablets (150 mg total) by mouth at bedtime. For mood control/depression (Patient not taking: Reported on 04/09/2016) 90 tablet 0  . traMADol (ULTRAM) 50 MG tablet Take 1 tablet (50 mg total) by mouth every 8 (eight) hours as needed (for pain). (Patient not taking: Reported on 04/09/2016) 20 tablet 0  . busPIRone (BUSPAR) 15 MG tablet Take 7.5 mg by mouth 2 (two) times daily. Reported on 05/31/2015    . oxyCODONE (OXY IR/ROXICODONE) 5 MG immediate release tablet Take 1-2 tablets (5-10 mg total) by mouth every 6 (six) hours as needed for severe pain. 30 tablet 0  . sertraline (ZOLOFT) 100 MG  tablet Take 1 tablet (100 mg total) by mouth daily. For depression (Patient not taking: Reported on 04/09/2016) 30 tablet 0  . traZODone (DESYREL) 50 MG tablet Take 25-50 mg by mouth at bedtime as needed for sleep.     . TRUEPLUS LANCETS 28G MISC 1 each by Does not apply route 3 (three) times daily. (Patient not taking: Reported on 04/09/2016) 100 each 12   No facility-administered medications prior to visit.     ROS Review of Systems  Constitutional: Negative for activity change, appetite change and fatigue.  HENT: Negative for congestion, sinus pressure and sore throat.   Eyes: Negative for visual disturbance.  Respiratory: Negative for cough, chest tightness, shortness of breath and wheezing.   Cardiovascular: Negative for chest pain and palpitations.  Gastrointestinal: Negative for abdominal distention, abdominal pain and constipation.  Endocrine: Negative for polydipsia.  Genitourinary: Negative for dysuria and frequency.  Musculoskeletal: Negative for arthralgias and back pain.  Skin: Negative for rash.  Neurological: Negative for tremors, light-headedness and numbness.  Hematological: Does not bruise/bleed easily.  Psychiatric/Behavioral: Positive for dysphoric mood. Negative for agitation, behavioral problems and suicidal ideas.    Objective:  BP 130/75 (BP Location: Right Arm, Patient Position: Sitting, Cuff Size: Small)   Pulse 69   Temp 98.2 F (36.8 C) (Oral)   Ht _0  (1.575 m)   Wt 121 lb (54.9 kg)   BMI 22.13 kg/m   BP/Weight 04/09/2016 1/77/1165 08/19/381  Systolic BP 338 329 191  Diastolic BP 75 73 81  Wt. (Lbs) 121 118 118  BMI 22.13 21.58 21.58  Some encounter information is confidential and restricted. Go to Review Flowsheets activity to see all data.      Physical Exam  Constitutional: She is oriented to person, place, and time. She appears well-developed and well-nourished.  Cardiovascular: Normal rate, normal heart sounds and intact distal pulses.     No murmur heard. Pulmonary/Chest: Effort normal and breath sounds normal. She has no wheezes. She has no rales. She exhibits no tenderness.  Abdominal: Soft. Bowel sounds are normal. She exhibits no distension and no mass. There is no tenderness.  Scars from laparoscopic cholecystectomy, healing well  Musculoskeletal: Normal range of motion.  Neurological: She is alert and oriented to person, place, and time.  Skin: Skin is warm and dry.  Psychiatric:  Sad    Lab Results  Component Value Date   HGBA1C 8.4 01/18/2016   CMP Latest Ref Rng & Units 03/20/2016 12/14/2015 11/15/2015  Glucose 65 - 99 mg/dL 92 311(H) 245(H)  BUN 6 - 20 mg/dL _1 Creatinine 0.44 - 1.00 mg/dL 0.70 0.68 0.67  Sodium 135 - 145 mmol/L 140 139 138  Potassium 3.5 - 5.1 mmol/L 3.5 3.7 3.8  Chloride 101 - 111 mmol/L 104 103 102  CO2 22 - 32 mmol/L _0 Calcium 8.9 - 10.3 mg/dL 9.6 9.6 9.9  Total Protein 6.5 - 8.1 g/dL - 7.5 7.6  Total Bilirubin 0.3 - 1.2 mg/dL - 0.9 1.3(H)  Alkaline Phos 38 - 126 U/L - 69 58  AST 15 - 41 U/L - 16 16  ALT 14 - 54 U/L - 18 21    Lipid Panel     Component Value Date/Time   CHOL 225 (H) 05/08/2014 0630   TRIG 169 (H) 05/08/2014 0630   HDL 40 05/08/2014 0630   CHOLHDL 5.6 05/08/2014 0630   VLDL 34 05/08/2014 0630   LDLCALC 151 (H) 05/08/2014 0630       Assessment & Plan:   1. Diabetes mellitus due to underlying condition with hyperosmolarity without coma, with long-term current use of insulin (HCC) Controlled with A1c of 8.41 Blood sugars reveal improvement No regimen changed Continue diabetic diet - Glucose (CBG) - metFORMIN (GLUCOPHAGE) 1000 MG tablet; Take 1 tablet (1,000 mg total) by mouth 2 (two) times daily.  Dispense: 60 tablet; Refill: 3 - insulin glargine (LANTUS) 100 UNIT/ML injection; Inject 0.15 mLs (15 Units total) into the skin at bedtime.  Dispense: 10 mL; Refill: 3 - glipiZIDE (GLUCOTROL) 10 MG tablet; Take 1 tablet (10 mg total) by mouth 2  (two) times daily before a meal.  Dispense: 60 tablet; Refill: 3 - TRUEPLUS LANCETS 28G MISC; 1 each by Does not apply route 3 (three) times daily.  Dispense: 100 each; Refill: 12 - COMPLETE METABOLIC PANEL WITH GFR - Lipid panel - Microalbumin / creatinine urine ratio  2. Other diabetic neurological complication associated with type 2 diabetes mellitus (HCC) Stable - gabapentin (NEURONTIN) 300 MG capsule; Take 1 capsule (300 mg total) by mouth 3 (three) times daily.  Dispense: 90 capsule; Refill: 3  3. HYPERCHOLESTEROLEMIA Uncontrolled Repeat lipid panel today Low-cholesterol diet - simvastatin (ZOCOR) 20 MG tablet; Take 1 tablet (20 mg total) by mouth daily at 6 PM.  Dispense: 30 tablet; Refill: 3  4. Schizoaffective disorder, depressive type ([place]) We will provide 30 day supply of mental health meds Patient advised to present to monitor for continuation of mental health care LCSW provided counseling the clinic as patient is currently depressed due to caring for sick husband - sertraline (ZOLOFT) 100 MG tablet; Take 1 tablet (100 mg total) by mouth daily. For depression  Dispense: 30 tablet; Refill: 0 - busPIRone (BUSPAR) 7.5 MG tablet; Take 1 tablet (7.5 mg total) by mouth 2 (two) times daily. Reported on 05/31/2015  Dispense: 30 tablet; Refill: 0 - traZODone (DESYREL) 50 MG tablet; Take 1 tablet (50 mg total) by mouth at bedtime as needed for sleep.  Dispense: 30 tablet; Refill: 0  5. Essential hypertension Controlled Low-sodium diet. - lisinopril (PRINIVIL,ZESTRIL) 20 MG tablet; Take 1 tablet (20 mg total) by mouth daily.  Dispense: 30 tablet; Refill: 3   Meds ordered this encounter  Medications  . metFORMIN (GLUCOPHAGE) 1000 MG tablet    Sig: Take 1 tablet (1,000 mg total) by mouth 2 (two) times daily.    Dispense:  60 tablet    Refill:  3  . insulin glargine (LANTUS) 100 UNIT/ML injection    Sig: Inject 0.15 mLs (15 Units total) into the skin at bedtime.    Dispense:  10 mL     Refill:  3  . glipiZIDE (GLUCOTROL) 10 MG tablet    Sig: Take 1  tablet (10 mg total) by mouth 2 (two) times daily before a meal.    Dispense:  60 tablet    Refill:  3  . gabapentin (NEURONTIN) 300 MG capsule    Sig: Take 1 capsule (300 mg total) by mouth 3 (three) times daily.    Dispense:  90 capsule    Refill:  3  . simvastatin (ZOCOR) 20 MG tablet    Sig: Take 1 tablet (20 mg total) by mouth daily at 6 PM.    Dispense:  30 tablet    Refill:  3  . lisinopril (PRINIVIL,ZESTRIL) 20 MG tablet    Sig: Take 1 tablet (20 mg total) by mouth daily.    Dispense:  30 tablet    Refill:  3  . sertraline (ZOLOFT) 100 MG tablet    Sig: Take 1 tablet (100 mg total) by mouth daily. For depression    Dispense:  30 tablet    Refill:  0    Subsequent refills will be from psych  . busPIRone (BUSPAR) 7.5 MG tablet    Sig: Take 1 tablet (7.5 mg total) by mouth 2 (two) times daily. Reported on 05/31/2015    Dispense:  30 tablet    Refill:  0    Subsequent refills from Psych  . Multiple Vitamin (MULTIVITAMIN) tablet    Sig: Take 1 tablet by mouth daily. Reported on 08/30/2015    Dispense:  30 tablet    Refill:  3  . traZODone (DESYREL) 50 MG tablet    Sig: Take 1 tablet (50 mg total) by mouth at bedtime as needed for sleep.    Dispense:  30 tablet    Refill:  0    Subsequent refills from psych  . TRUEPLUS LANCETS 28G MISC    Sig: 1 each by Does not apply route 3 (three) times daily.    Dispense:  100 each    Refill:  12    Follow-up: Return in about 3 months (around 07/07/2016) for Follow-up on diabetes mellitus.   Arnoldo Morale MD

## 2016-04-09 NOTE — Telephone Encounter (Signed)
Patient called stating that she had real bad diarrhea, therefore, she would cancel her appointment for tomorrow with Dr. Hampton Abbot. She stated that since she rides the bus, she did not want to have an accident on her way coming or leaving. However, she stated that she would call us back to reschedule her post-op appointment.  Besides the diarrhea, patient stated that she was feeling pretty good.

## 2016-04-10 ENCOUNTER — Encounter: Payer: Self-pay | Admitting: Surgery

## 2016-04-10 LAB — MICROALBUMIN / CREATININE URINE RATIO
Creatinine, Urine: 241 mg/dL (ref 20–320)
MICROALB/CREAT RATIO: 6 ug/mg{creat} (ref <30)
Microalb, Ur: 1.4 mg/dL

## 2016-04-15 MED FILL — ?SIMVASTATIN 20 MG TABLET: 20 MG | 30 days supply | Qty: 30 | Fill #0

## 2016-04-15 MED FILL — traZODone HCL 50 MG TABS: 50 | 30 days supply | Qty: 30 | Fill #0

## 2016-04-15 MED FILL — !LANTUS SOLOSTAR 100UNITS/M: 100 | 28 days supply | Qty: 3 | Fill #0

## 2016-04-15 MED FILL — ?LISINOPRIL 20 MG TABLET: 20 | 30 days supply | Qty: 30 | Fill #2

## 2016-04-16 ENCOUNTER — Telehealth: Payer: Self-pay

## 2016-04-16 NOTE — Telephone Encounter (Signed)
-----   Message from Arnoldo Morale, MD sent at 04/10/2016 10:03 AM EST ----- Labs stable, cholesterol is normal. Slightly low glucose due to the fact that she was fasting.

## 2016-04-16 NOTE — Telephone Encounter (Signed)
Writer has called patient several times and LVM requesting her to call back to discuss lab results.

## 2016-04-17 ENCOUNTER — Ambulatory Visit (INDEPENDENT_AMBULATORY_CARE_PROVIDER_SITE_OTHER): Payer: Self-pay | Admitting: General Surgery

## 2016-04-17 ENCOUNTER — Telehealth: Payer: Self-pay | Admitting: Family Medicine

## 2016-04-17 ENCOUNTER — Encounter: Payer: Self-pay | Admitting: General Surgery

## 2016-04-17 VITALS — BP 188/93 | HR 80 | Temp 98.4°F | Ht 62.0 in | Wt 124.4 lb

## 2016-04-17 DIAGNOSIS — Z4889 Encounter for other specified surgical aftercare: Secondary | ICD-10-CM

## 2016-04-17 NOTE — Progress Notes (Signed)
Outpatient Surgical Follow Up  04/17/2016  Lorraine Turner is an 56 y.o. female.   Chief Complaint  Patient presents with  . Routine Post Op    Laparoscopic Cholecystectomy-Dr.Piscoya-03/27/16    HPI: 56 year old female returns to clinic for follow-up 3 weeks status post laparoscopic cholecystectomy. Having significant emotional stressors secondary to terminally ill husband. Reports that she is not having pain. She is eating well and having normal bowel function. She denies any fevers, chills, nausea, vomiting. She does have a decreased appetite secondary to depression. Otherwise doing very well.  Past Medical History:  Diagnosis Date  . Anxiety   . Bipolar 1 disorder ([place])   . Depression   . Diabetes mellitus without complication ([place])   . Gallstones   . Hyperlipidemia   . Hypertension   . Neuropathy ([place])   . Schizophrenia ([place])   . Seizures ([place])    X1- febrile seizure as a child-none since    Past Surgical History:  Procedure Laterality Date  . CESAREAN SECTION  05/1998   X 1  . CHOLECYSTECTOMY N/A 03/27/2016   Procedure: LAPAROSCOPIC CHOLECYSTECTOMY;  Surgeon: Olean Ree, MD;  Location: [facility] ORS;  Service: General;  Laterality: N/A;  . TONSILLECTOMY AND ADENOIDECTOMY     age 74  . TUBAL LIGATION      Family History  Problem Relation Age of Onset  . Diabetes Mother   . Heart disease Mother   . Kidney disease Mother   . Stroke Mother   . Hyperlipidemia Mother   . Diabetes Maternal Aunt     Social History:  reports that she has been smoking Cigarettes.  She has never used smokeless tobacco. She reports that she drinks about 0.6 oz of alcohol per week . She reports that she does not use drugs.  Allergies: No Known Allergies  Medications reviewed.    ROS A multipoint review of systems was completed. All pertinent positives and negatives are documented within the history of present illness the remainder negative.   BP (!) 188/93   Pulse 80   Temp 98.4 F  (36.9 C) (Oral)   Ht 5\' 2"  (1.575 m)   Wt 56.4 kg (124 lb 6.4 oz)   BMI 22.75 kg/m   Physical Exam Gen.: No acute distress Chest: Clear to auscultation Heart: Regular rhythm Abdomen: Soft, nontender, nondistended. Well approximated laparoscopic incision sites on evidence of bleeding or infection.    No results found for this or any previous visit (from the past 48 hour(s)). No results found.  Assessment/Plan:  1. Aftercare following surgery 56 year old female status post laparoscopic cholecystectomy. Doing very well. Discussed the signs and symptoms of infection or worsening and to return to clinic medially should they occur. Otherwise she will follow up on an as-needed basis.     Clayburn Pert, MD FACS General Surgeon  04/17/2016,9:00 AM

## 2016-04-17 NOTE — Patient Instructions (Signed)
Please call our office if you have questions or concerns.   

## 2016-04-17 NOTE — Telephone Encounter (Signed)
Patient called the office returning nurse' call. Please follow up.  Thank you.

## 2016-04-18 NOTE — Telephone Encounter (Signed)
Writer spoke with patient's ex husband and asked him to let her know that her lab results were all normal.

## 2016-04-18 NOTE — Telephone Encounter (Signed)
-----   Message from Arnoldo Morale, MD sent at 04/10/2016 10:03 AM EST ----- Labs stable, cholesterol is normal. Slightly low glucose due to the fact that she was fasting.

## 2016-06-14 ENCOUNTER — Other Ambulatory Visit: Payer: Self-pay | Admitting: Family Medicine

## 2016-06-14 DIAGNOSIS — F251 Schizoaffective disorder, depressive type: Secondary | ICD-10-CM

## 2016-06-14 MED FILL — glipiZIDE 10 MG TABS: 10 | 30 days supply | Qty: 60 | Fill #1

## 2016-06-14 MED FILL — ?LISINOPRIL 20 MG TABLET: 20 | 30 days supply | Qty: 30 | Fill #3

## 2016-06-14 MED FILL — metFORMIN HCL 1000 MG TABS: 1000 | 30 days supply | Qty: 60 | Fill #3

## 2016-06-17 ENCOUNTER — Ambulatory Visit: Payer: Medicaid Other | Attending: Family Medicine | Admitting: Family Medicine

## 2016-06-17 ENCOUNTER — Encounter: Payer: Self-pay | Admitting: Family Medicine

## 2016-06-17 VITALS — BP 185/71 | HR 70 | Temp 98.4°F | Resp 18 | Ht 62.0 in | Wt 128.0 lb

## 2016-06-17 DIAGNOSIS — Z79899 Other long term (current) drug therapy: Secondary | ICD-10-CM | POA: Diagnosis not present

## 2016-06-17 DIAGNOSIS — E1140 Type 2 diabetes mellitus with diabetic neuropathy, unspecified: Secondary | ICD-10-CM | POA: Diagnosis not present

## 2016-06-17 DIAGNOSIS — Z634 Disappearance and death of family member: Secondary | ICD-10-CM | POA: Diagnosis not present

## 2016-06-17 DIAGNOSIS — E870 Hyperosmolality and hypernatremia: Secondary | ICD-10-CM | POA: Diagnosis not present

## 2016-06-17 DIAGNOSIS — E1165 Type 2 diabetes mellitus with hyperglycemia: Secondary | ICD-10-CM | POA: Insufficient documentation

## 2016-06-17 DIAGNOSIS — Z9114 Patient's other noncompliance with medication regimen: Secondary | ICD-10-CM | POA: Diagnosis not present

## 2016-06-17 DIAGNOSIS — E7800 Pure hypercholesterolemia, unspecified: Secondary | ICD-10-CM | POA: Insufficient documentation

## 2016-06-17 DIAGNOSIS — F251 Schizoaffective disorder, depressive type: Secondary | ICD-10-CM | POA: Diagnosis not present

## 2016-06-17 DIAGNOSIS — E1149 Type 2 diabetes mellitus with other diabetic neurological complication: Secondary | ICD-10-CM

## 2016-06-17 DIAGNOSIS — E0800 Diabetes mellitus due to underlying condition with hyperosmolarity without nonketotic hyperglycemic-hyperosmolar coma (NKHHC): Secondary | ICD-10-CM

## 2016-06-17 DIAGNOSIS — R197 Diarrhea, unspecified: Secondary | ICD-10-CM | POA: Insufficient documentation

## 2016-06-17 DIAGNOSIS — Z794 Long term (current) use of insulin: Secondary | ICD-10-CM | POA: Insufficient documentation

## 2016-06-17 DIAGNOSIS — I10 Essential (primary) hypertension: Secondary | ICD-10-CM | POA: Diagnosis not present

## 2016-06-17 LAB — POCT GLYCOSYLATED HEMOGLOBIN (HGB A1C): Hemoglobin A1C: 7.1

## 2016-06-17 LAB — GLUCOSE, POCT (MANUAL RESULT ENTRY): POC Glucose: 277 mg/dl — AB (ref 70–99)

## 2016-06-17 MED ORDER — INSULIN GLARGINE 100 UNIT/ML ~~LOC~~ SOLN: 15.0000 [IU] | Freq: Every day | SUBCUTANEOUS | 3 refills | Status: DC

## 2016-06-17 MED ORDER — SERTRALINE HCL 100 MG PO TABS: 100.0000 mg | ORAL_TABLET | Freq: Every day | ORAL | 0 refills | Status: DC

## 2016-06-17 MED ORDER — GABAPENTIN 300 MG PO CAPS: 300.0000 mg | ORAL_CAPSULE | Freq: Three times a day (TID) | ORAL | 3 refills | Status: DC

## 2016-06-17 MED ORDER — SIMVASTATIN 20 MG PO TABS: 20.0000 mg | ORAL_TABLET | Freq: Every day | ORAL | 3 refills | Status: DC

## 2016-06-17 MED ORDER — GLIPIZIDE 10 MG PO TABS: 10.0000 mg | ORAL_TABLET | Freq: Two times a day (BID) | ORAL | 3 refills | Status: DC

## 2016-06-17 MED ORDER — METFORMIN HCL 1000 MG PO TABS: 1000.0000 mg | ORAL_TABLET | Freq: Two times a day (BID) | ORAL | 3 refills | Status: DC

## 2016-06-17 MED ORDER — BUSPIRONE HCL 7.5 MG PO TABS: 7.5000 mg | ORAL_TABLET | Freq: Two times a day (BID) | ORAL | 0 refills | Status: DC

## 2016-06-17 MED ORDER — LOPERAMIDE HCL 2 MG PO TABS: 2.0000 mg | ORAL_TABLET | Freq: Three times a day (TID) | ORAL | 0 refills | Status: DC | PRN

## 2016-06-17 MED ORDER — LISINOPRIL 20 MG PO TABS: 20.0000 mg | ORAL_TABLET | Freq: Every day | ORAL | 3 refills | Status: DC

## 2016-06-17 MED FILL — busPIRone HCL 7.5 MG TABS: 7.5 | 30 days supply | Qty: 60 | Fill #0

## 2016-06-17 MED FILL — traZODone HCL 50 MG TABS: 50 | 30 days supply | Qty: 30 | Fill #0

## 2016-06-17 MED FILL — LANTUS 100 UNITS/ML VIAL: 100 | 28 days supply | Qty: 10 | Fill #0

## 2016-06-17 MED FILL — GABAPENTIN 300 MG CAPSULE: 300 | 30 days supply | Qty: 90 | Fill #0

## 2016-06-17 MED FILL — SERTRALINE HCL 100 MG TAB: 100 | 30 days supply | Qty: 30 | Fill #0

## 2016-06-17 MED FILL — SIMVASTATIN 20 MG TABLET: 20 | 30 days supply | Qty: 30 | Fill #0

## 2016-06-17 NOTE — Progress Notes (Signed)
Patient is here for DM  Patient denies pain at this time.  Patient has not taken medication since the death of her husband on 05-01-2016. Patient has not eaten today.  Patient denies any suicidal plans at this time.

## 2016-06-17 NOTE — Progress Notes (Signed)
Subjective:  Patient ID: Lorraine Turner, female    DOB: Jan 10, 1961  Age: 56 y.o. MRN: [id number]  CC: Diabetes   HPI Lorraine Turner is a 56 year old female with a history of schizoaffective affective disorder, hypertension, diabetic neuropathy, type 2 diabetes mellitus (A1c 7.1) who presents today for follow-up visit.  She has not been compliant with her medications hence had elevated blood pressure. This has been since she lost her husband 6 weeks ago to end-stage lung cancer. Currently has an upcoming appointment with hospice for counseling.  Has been eating a low carb diet and denies hypoglycemia, visual complaints or numbness in extremities. She also has an upcoming [facility].   Past Medical History:  Diagnosis Date  . Anxiety   . Bipolar 1 disorder ([place])   . Depression   . Diabetes mellitus without complication ([place])   . Gallstones   . Hyperlipidemia   . Hypertension   . Neuropathy   . Schizophrenia ([place])   . Seizures ([place])    X1- febrile seizure as a child-none since    Past Surgical History:  Procedure Laterality Date  . CESAREAN SECTION  05/1998   X 1  . CHOLECYSTECTOMY N/A 03/27/2016   Procedure: LAPAROSCOPIC CHOLECYSTECTOMY;  Surgeon: Olean Ree, MD;  Location: [facility] ORS;  Service: General;  Laterality: N/A;  . TONSILLECTOMY AND ADENOIDECTOMY     age 35  . TUBAL LIGATION       Outpatient Medications Prior to Visit  Medication Sig Dispense Refill  . Blood Glucose Monitoring Suppl (TRUE METRIX METER) w/Device KIT 1 Device by Does not apply route 3 (three) times daily. (Patient taking differently: 1 Device by Does not apply route daily. ) 1 kit 0  . ibuprofen (ADVIL) 200 MG tablet Take 200 mg by mouth every 6 (six) hours as needed.    . Insulin Syringe-Needle U-100 31G X 5/16" 0.3 ML MISC 1 each by Does not apply route 4 (four) times daily -  before meals and at bedtime. 100 each 12  . Multiple Vitamin  (MULTIVITAMIN) tablet Take 1 tablet by mouth daily. Reported on 08/30/2015 30 tablet 3  . Naproxen Sodium (ALEVE PO) Take 1 tablet by mouth as needed.    Marland Kitchen QUEtiapine (SEROQUEL) 50 MG tablet Take 3 tablets (150 mg total) by mouth at bedtime. For mood control/depression 90 tablet 0  . traZODone (DESYREL) 50 MG tablet TAKE 1 TABLET BY MOUTH AT BEDTIME AS NEEDED FOR SLEEP. 30 tablet 0  . TRUEPLUS LANCETS 28G MISC 1 each by Does not apply route 3 (three) times daily. 100 each 12  . busPIRone (BUSPAR) 7.5 MG tablet Take 1 tablet (7.5 mg total) by mouth 2 (two) times daily. Reported on 05/31/2015 30 tablet 0  . gabapentin (NEURONTIN) 300 MG capsule Take 1 capsule (300 mg total) by mouth 3 (three) times daily. 90 capsule 3  . glipiZIDE (GLUCOTROL) 10 MG tablet Take 1 tablet (10 mg total) by mouth 2 (two) times daily before a meal. 60 tablet 3  . insulin glargine (LANTUS) 100 UNIT/ML injection Inject 0.15 mLs (15 Units total) into the skin at bedtime. 10 mL 3  . lisinopril (PRINIVIL,ZESTRIL) 20 MG tablet Take 1 tablet (20 mg total) by mouth daily. 30 tablet 3  . metFORMIN (GLUCOPHAGE) 1000 MG tablet Take 1 tablet (1,000 mg total) by mouth 2 (two) times daily. 60 tablet 3  . ondansetron (ZOFRAN ODT) 4 MG disintegrating tablet Take 1  tablet (4 mg total) by mouth every 8 (eight) hours as needed for nausea or vomiting. 30 tablet 0  . sertraline (ZOLOFT) 100 MG tablet Take 1 tablet (100 mg total) by mouth daily. For depression 30 tablet 0  . simvastatin (ZOCOR) 20 MG tablet Take 1 tablet (20 mg total) by mouth daily at 6 PM. 30 tablet 3  . traMADol (ULTRAM) 50 MG tablet Take 1 tablet (50 mg total) by mouth every 8 (eight) hours as needed (for pain). 20 tablet 0   No facility-administered medications prior to visit.     ROS Review of Systems  Constitutional: Negative for activity change, appetite change and fatigue.  HENT: Negative for congestion, sinus pressure and sore throat.   Eyes: Negative for visual  disturbance.  Respiratory: Negative for cough, chest tightness, shortness of breath and wheezing.   Cardiovascular: Negative for chest pain and palpitations.  Gastrointestinal: Negative for abdominal distention, abdominal pain and constipation.  Endocrine: Negative for polydipsia.  Genitourinary: Negative for dysuria and frequency.  Musculoskeletal: Negative for arthralgias and back pain.  Skin: Negative for rash.  Neurological: Negative for tremors, light-headedness and numbness.  Hematological: Does not bruise/bleed easily.  Psychiatric/Behavioral: Positive for dysphoric mood. Negative for agitation and behavioral problems.     Objective:  BP (!) 185/71 (BP Location: Left Arm, Patient Position: Sitting, Cuff Size: Normal)   Pulse 70   Temp 98.4 F (36.9 C) (Oral)   Resp 18   Ht '5\' 2"'  (1.575 m)   Wt 128 lb (58.1 kg)   SpO2 100%   BMI 23.41 kg/m   BP/Weight 06/17/2016 04/17/2016 0/45/4098  Systolic BP 119 147 829  Diastolic BP 71 93 75  Wt. (Lbs) 128 124.4 121  BMI 23.41 22.75 22.13  Some encounter information is confidential and restricted. Go to Review Flowsheets activity to see all data.      Physical Exam Constitutional: She is oriented to person, place, and time. She appears well-developed and well-nourished.  Cardiovascular: Normal rate, normal heart sounds and intact distal pulses.   No murmur heard. Pulmonary/Chest: Effort normal and breath sounds normal. She has no wheezes. She has no rales. She exhibits no tenderness.  Abdominal: Soft. Bowel sounds are normal. She exhibits no distension and no mass. There is no tenderness.  Healed scars from laparoscopic cholecystectomy Musculoskeletal: Normal range of motion.  Neurological: She is alert and oriented to person, place, and time.  Skin: Skin is warm and dry.  Psychiatric:  Sad   Lab Results  Component Value Date   HGBA1C 7.1 06/17/2016    Assessment & Plan:   1. Type 2 diabetes mellitus with hyperglycemia,  with long-term current use of insulin (HCC) Advised to reduce Lantus by 2 units in the event of hypoglycemia as A1c is controlled despite noncompliance with medications. Optimal control could also be attributed to poor appetite since the passing of the hospital. - POCT A1C - Glucose (CBG)  2. Schizoaffective disorder, depressive type [facility]) She has an upcoming appointment with family services - busPIRone (BUSPAR) 7.5 MG tablet; Take 1 tablet (7.5 mg total) by mouth 2 (two) times daily. Reported on 05/31/2015  Dispense: 60 tablet; Refill: 0 - sertraline (ZOLOFT) 100 MG tablet; Take 1 tablet (100 mg total) by mouth daily. For depression  Dispense: 30 tablet; Refill: 0  3. Diabetes mellitus due to underlying condition with hyperosmolarity without coma, with long-term current use of insulin (HCC) Controlled - glipiZIDE (GLUCOTROL) 10 MG tablet; Take 1 tablet (10 mg total)  by mouth 2 (two) times daily before a meal.  Dispense: 60 tablet; Refill: 3 - insulin glargine (LANTUS) 100 UNIT/ML injection; Inject 0.15 mLs (15 Units total) into the skin at bedtime.  Dispense: 10 mL; Refill: 3 - metFORMIN (GLUCOPHAGE) 1000 MG tablet; Take 1 tablet (1,000 mg total) by mouth 2 (two) times daily.  Dispense: 60 tablet; Refill: 3  4. Other diabetic neurological complication associated with type 2 diabetes mellitus (HCC) Controlled - gabapentin (NEURONTIN) 300 MG capsule; Take 1 capsule (300 mg total) by mouth 3 (three) times daily.  Dispense: 90 capsule; Refill: 3  5. Essential hypertension Controlled - lisinopril (PRINIVIL,ZESTRIL) 20 MG tablet; Take 1 tablet (20 mg total) by mouth daily.  Dispense: 30 tablet; Refill: 3  6. HYPERCHOLESTEROLEMIA Stable - simvastatin (ZOCOR) 20 MG tablet; Take 1 tablet (20 mg total) by mouth daily at 6 PM.  Dispense: 30 tablet; Refill: 3  7. Diarrhea, unspecified type - loperamide (IMODIUM A-D) 2 MG tablet; Take 1 tablet (2 mg total) by mouth 3 (three) times daily as needed  for diarrhea or loose stools.  Dispense: 30 tablet; Refill: 0  8. Bereavement She has upcoming counseling sessions with hospice   Meds ordered this encounter  Medications  . busPIRone (BUSPAR) 7.5 MG tablet    Sig: Take 1 tablet (7.5 mg total) by mouth 2 (two) times daily. Reported on 05/31/2015    Dispense:  60 tablet    Refill:  0    Subsequent refills from Psych  . glipiZIDE (GLUCOTROL) 10 MG tablet    Sig: Take 1 tablet (10 mg total) by mouth 2 (two) times daily before a meal.    Dispense:  60 tablet    Refill:  3  . gabapentin (NEURONTIN) 300 MG capsule    Sig: Take 1 capsule (300 mg total) by mouth 3 (three) times daily.    Dispense:  90 capsule    Refill:  3  . insulin glargine (LANTUS) 100 UNIT/ML injection    Sig: Inject 0.15 mLs (15 Units total) into the skin at bedtime.    Dispense:  10 mL    Refill:  3  . lisinopril (PRINIVIL,ZESTRIL) 20 MG tablet    Sig: Take 1 tablet (20 mg total) by mouth daily.    Dispense:  30 tablet    Refill:  3  . metFORMIN (GLUCOPHAGE) 1000 MG tablet    Sig: Take 1 tablet (1,000 mg total) by mouth 2 (two) times daily.    Dispense:  60 tablet    Refill:  3  . simvastatin (ZOCOR) 20 MG tablet    Sig: Take 1 tablet (20 mg total) by mouth daily at 6 PM.    Dispense:  30 tablet    Refill:  3  . sertraline (ZOLOFT) 100 MG tablet    Sig: Take 1 tablet (100 mg total) by mouth daily. For depression    Dispense:  30 tablet    Refill:  0    Subsequent refills will be from psych  . loperamide (IMODIUM A-D) 2 MG tablet    Sig: Take 1 tablet (2 mg total) by mouth 3 (three) times daily as needed for diarrhea or loose stools.    Dispense:  30 tablet    Refill:  0    Follow-up: Return in about 1 month (around 07/18/2016) for complete physical exam.   Arnoldo Morale MD

## 2016-07-22 ENCOUNTER — Encounter: Payer: Self-pay | Admitting: Family Medicine

## 2016-09-02 ENCOUNTER — Ambulatory Visit: Payer: Self-pay | Admitting: Family Medicine

## 2016-09-23 ENCOUNTER — Encounter: Payer: Self-pay | Admitting: Family Medicine

## 2016-09-23 ENCOUNTER — Ambulatory Visit: Payer: Medicaid Other | Attending: Family Medicine | Admitting: Family Medicine

## 2016-09-23 ENCOUNTER — Ambulatory Visit: Payer: Medicaid Other | Admitting: Licensed Clinical Social Worker

## 2016-09-23 VITALS — BP 157/72 | HR 67 | Temp 98.3°F | Ht 62.0 in | Wt 129.6 lb

## 2016-09-23 DIAGNOSIS — F251 Schizoaffective disorder, depressive type: Secondary | ICD-10-CM | POA: Diagnosis not present

## 2016-09-23 DIAGNOSIS — Z9114 Patient's other noncompliance with medication regimen: Secondary | ICD-10-CM | POA: Diagnosis not present

## 2016-09-23 DIAGNOSIS — Z794 Long term (current) use of insulin: Secondary | ICD-10-CM

## 2016-09-23 DIAGNOSIS — Z1231 Encounter for screening mammogram for malignant neoplasm of breast: Secondary | ICD-10-CM | POA: Diagnosis not present

## 2016-09-23 DIAGNOSIS — R197 Diarrhea, unspecified: Secondary | ICD-10-CM | POA: Diagnosis not present

## 2016-09-23 DIAGNOSIS — R109 Unspecified abdominal pain: Secondary | ICD-10-CM | POA: Diagnosis not present

## 2016-09-23 DIAGNOSIS — E7800 Pure hypercholesterolemia, unspecified: Secondary | ICD-10-CM

## 2016-09-23 DIAGNOSIS — Z634 Disappearance and death of family member: Secondary | ICD-10-CM | POA: Diagnosis not present

## 2016-09-23 DIAGNOSIS — E1140 Type 2 diabetes mellitus with diabetic neuropathy, unspecified: Secondary | ICD-10-CM | POA: Insufficient documentation

## 2016-09-23 DIAGNOSIS — E1165 Type 2 diabetes mellitus with hyperglycemia: Secondary | ICD-10-CM | POA: Insufficient documentation

## 2016-09-23 DIAGNOSIS — Z79899 Other long term (current) drug therapy: Secondary | ICD-10-CM | POA: Insufficient documentation

## 2016-09-23 DIAGNOSIS — E1100 Type 2 diabetes mellitus with hyperosmolarity without nonketotic hyperglycemic-hyperosmolar coma (NKHHC): Secondary | ICD-10-CM | POA: Insufficient documentation

## 2016-09-23 DIAGNOSIS — Z1211 Encounter for screening for malignant neoplasm of colon: Secondary | ICD-10-CM | POA: Insufficient documentation

## 2016-09-23 DIAGNOSIS — I10 Essential (primary) hypertension: Secondary | ICD-10-CM | POA: Diagnosis not present

## 2016-09-23 DIAGNOSIS — Z9119 Patient's noncompliance with other medical treatment and regimen: Secondary | ICD-10-CM | POA: Insufficient documentation

## 2016-09-23 DIAGNOSIS — E1149 Type 2 diabetes mellitus with other diabetic neurological complication: Secondary | ICD-10-CM

## 2016-09-23 DIAGNOSIS — E0800 Diabetes mellitus due to underlying condition with hyperosmolarity without nonketotic hyperglycemic-hyperosmolar coma (NKHHC): Secondary | ICD-10-CM | POA: Diagnosis not present

## 2016-09-23 DIAGNOSIS — Z1239 Encounter for other screening for malignant neoplasm of breast: Secondary | ICD-10-CM

## 2016-09-23 LAB — POCT GLYCOSYLATED HEMOGLOBIN (HGB A1C): Hemoglobin A1C: 8.9

## 2016-09-23 LAB — GLUCOSE, POCT (MANUAL RESULT ENTRY)
POC Glucose: 370 mg/dl — AB (ref 70–99)
POC Glucose: 381 mg/dl — AB (ref 70–99)

## 2016-09-23 MED ORDER — INSULIN GLARGINE 100 UNIT/ML ~~LOC~~ SOLN: 15.0000 [IU] | Freq: Every day | SUBCUTANEOUS | 3 refills | Status: DC

## 2016-09-23 MED ORDER — INSULIN ASPART 100 UNIT/ML ~~LOC~~ SOLN
8.0000 [IU] | Freq: Once | SUBCUTANEOUS | Status: AC
  Administered 2016-09-23: 8 [IU] via SUBCUTANEOUS

## 2016-09-23 MED ORDER — SIMVASTATIN 20 MG PO TABS: 20.0000 mg | ORAL_TABLET | Freq: Every day | ORAL | 3 refills | Status: DC

## 2016-09-23 MED ORDER — METFORMIN HCL 1000 MG PO TABS: 1000.0000 mg | ORAL_TABLET | Freq: Two times a day (BID) | ORAL | 3 refills | Status: DC

## 2016-09-23 MED ORDER — GABAPENTIN 300 MG PO CAPS: 300.0000 mg | ORAL_CAPSULE | Freq: Three times a day (TID) | ORAL | 3 refills | Status: DC

## 2016-09-23 MED ORDER — GLIPIZIDE 10 MG PO TABS: 10.0000 mg | ORAL_TABLET | Freq: Two times a day (BID) | ORAL | 3 refills | Status: DC

## 2016-09-23 MED ORDER — LISINOPRIL 20 MG PO TABS: 20.0000 mg | ORAL_TABLET | Freq: Every day | ORAL | 3 refills | Status: DC

## 2016-09-23 NOTE — Patient Instructions (Signed)

## 2016-09-23 NOTE — BH Specialist Note (Signed)
Integrated Behavioral Health Initial Visit  MRN: [id number] Name: AMANDAMARIE FEGGINS   Session Start time: 11:45 AM Session End time: 12:25 PM Total time: 40 minutes  Type of Service: [place] Interpretor:No. Interpretor Name and Language: N/A   Warm Hand Off Completed.       SUBJECTIVE: Lorraine Turner is a 56 y.o. female accompanied by patient. Patient was referred by Dr. Jarold Song for depression. Patient reports the following symptoms/concerns: overwhelming feelings of sadness, low energy, difficulty sleeping, decreased appetite, inability to concentrate, and irritability Duration of problem: Ongoing Pt has been diagnosed with Schizoaffective Disorder with Depression; Severity of problem: severe  OBJECTIVE: Mood: Dysphoric and Affect: Depressed and Tearful Risk of harm to self or others: No plan to harm self or others   LIFE CONTEXT: Family and Social: Pt resides with her children. She receives support from friends School/Work: Pt disclosed that her food stamps were decreased to $243. She receives Widow benefits Self-Care: Pt does her nails. She has decreased interest in art and crocheting since the loss of her spouse. No report of substance use Life Changes: Pt is grieving the loss of her spouse from end stage lung cancer  GOALS ADDRESSED: Patient will reduce symptoms of: depression and increase knowledge and/or ability of: coping skills and also: Increase adequate support systems for patient/family, Improve medication compliance and Begin healthy grieving over loss   INTERVENTIONS: Supportive Counseling, Psychoeducation and/or Health Education and Link to [employer]  Standardized Assessments completed: GAD-7 and PHQ 2&9  ASSESSMENT: Pt currently experiencing depression triggered by grieving the loss of her husband from end-stage lung cancer. Pt reports overwhelming feelings of sadness, low energy, difficulty sleeping,  decreased appetite, inability to concentrate, and irritability. Pt may benefit from psychoeducation, psychotherapy, and medication management. Pt has discontinued behavioral health services with [facility]. She reports upcoming appointment with [facility] to re-initiate medication management. [facility] educated pt on the stages of grief and discussed benefits of applying additional healthy coping skills to decrease symptoms. Pt is knowledgeable about hospice services. LCSWA provided resources for grief support, crisis intervention, and psychotherapy.   PLAN: 1. Follow up with behavioral health clinician on : Pt was encouraged tocontact LCSWA if symptoms worsen or fail to improveto schedule behavioral appointments at [facility]. 2. Behavioral recommendations: LCSWA recommends that pt apply healthy coping skills discussed, initiate medication management and psychotherapy through [facility] [place], and utilize provided grief support resources. Pt is encouraged to schedule follow up appointment with LCSWA 3. Referral(s): [facility] (LME/Outside Clinic) 4. "From scale of 1-10, how likely are you to follow plan?": 7/10  Rebekah Chesterfield, LCSW 09/25/16 4:12 PM

## 2016-09-23 NOTE — Progress Notes (Signed)
Subjective:  Patient ID: Lorraine Turner, female    DOB: July 13, 1960  Age: 56 y.o. MRN: [id number]  CC: Diabetes and Medication Refill   HPI EMON MIGGINS  is a 56 year old female with a history of schizoaffective affective disorder, hypertension, diabetic neuropathy, type 2 diabetes mellitus (A1c  8.9 which is up from 7.1) who presents today for follow-up visit.  She has not been compliant with her medications, diabetic diet, low sodium diet hence had elevated blood pressure and elevated blood sugar. This has been since she lost her husband 4 months ago to end-stage lung cancer. She states she feels hopeless and is also burdened with responsibilities of caring for her children. She denies suicidal ideations or intent but has not been compliant with her psychiatry follow-ups at [facility]. She informs me she has an upcoming appointment with [facility] the [place].  Her diarrhea has improved but she sometimes has excessive bloating in her stomach or abdominal pain when she puts on tight clothing but this occurs sparingly.  Past Medical History:  Diagnosis Date  . Anxiety   . Bipolar 1 disorder ([place])   . Depression   . Diabetes mellitus without complication ([place])   . Gallstones   . Hyperlipidemia   . Hypertension   . Neuropathy   . Schizophrenia ([place])   . Seizures ([place])    X1- febrile seizure as a child-none since    Past Surgical History:  Procedure Laterality Date  . CESAREAN SECTION  05/1998   X 1  . CHOLECYSTECTOMY N/A 03/27/2016   Procedure: LAPAROSCOPIC CHOLECYSTECTOMY;  Surgeon: Olean Ree, MD;  Location: [facility] ORS;  Service: General;  Laterality: N/A;  . TONSILLECTOMY AND ADENOIDECTOMY     age 67  . TUBAL LIGATION      No Known Allergies   Outpatient Medications Prior to Visit  Medication Sig Dispense Refill  . Blood Glucose Monitoring Suppl (TRUE METRIX METER) w/Device KIT 1 Device by Does not apply route 3 (three) times daily. (Patient not taking:  Reported on 09/23/2016) 1 kit 0  . busPIRone (BUSPAR) 7.5 MG tablet Take 1 tablet (7.5 mg total) by mouth 2 (two) times daily. Reported on 05/31/2015 (Patient not taking: Reported on 09/23/2016) 60 tablet 0  . ibuprofen (ADVIL) 200 MG tablet Take 200 mg by mouth every 6 (six) hours as needed.    . Insulin Syringe-Needle U-100 31G X 5/16" 0.3 ML MISC 1 each by Does not apply route 4 (four) times daily -  before meals and at bedtime. (Patient not taking: Reported on 09/23/2016) 100 each 12  . Multiple Vitamin (MULTIVITAMIN) tablet Take 1 tablet by mouth daily. Reported on 08/30/2015 (Patient not taking: Reported on 09/23/2016) 30 tablet 3  . Naproxen Sodium (ALEVE PO) Take 1 tablet by mouth as needed.    Marland Kitchen QUEtiapine (SEROQUEL) 50 MG tablet Take 3 tablets (150 mg total) by mouth at bedtime. For mood control/depression (Patient not taking: Reported on 09/23/2016) 90 tablet 0  . sertraline (ZOLOFT) 100 MG tablet Take 1 tablet (100 mg total) by mouth daily. For depression (Patient not taking: Reported on 09/23/2016) 30 tablet 0  . traZODone (DESYREL) 50 MG tablet TAKE 1 TABLET BY MOUTH AT BEDTIME AS NEEDED FOR SLEEP. (Patient not taking: Reported on 09/23/2016) 30 tablet 0  . TRUEPLUS LANCETS 28G MISC 1 each by Does not apply route 3 (three) times daily. (Patient not taking: Reported on 09/23/2016) 100 each 12  . gabapentin (NEURONTIN) 300 MG capsule Take 1  capsule (300 mg total) by mouth 3 (three) times daily. (Patient not taking: Reported on 09/23/2016) 90 capsule 3  . glipiZIDE (GLUCOTROL) 10 MG tablet Take 1 tablet (10 mg total) by mouth 2 (two) times daily before a meal. (Patient not taking: Reported on 09/23/2016) 60 tablet 3  . insulin glargine (LANTUS) 100 UNIT/ML injection Inject 0.15 mLs (15 Units total) into the skin at bedtime. (Patient not taking: Reported on 09/23/2016) 10 mL 3  . lisinopril (PRINIVIL,ZESTRIL) 20 MG tablet Take 1 tablet (20 mg total) by mouth daily. (Patient not taking: Reported on  09/23/2016) 30 tablet 3  . loperamide (IMODIUM A-D) 2 MG tablet Take 1 tablet (2 mg total) by mouth 3 (three) times daily as needed for diarrhea or loose stools. (Patient not taking: Reported on 09/23/2016) 30 tablet 0  . metFORMIN (GLUCOPHAGE) 1000 MG tablet Take 1 tablet (1,000 mg total) by mouth 2 (two) times daily. (Patient not taking: Reported on 09/23/2016) 60 tablet 3  . simvastatin (ZOCOR) 20 MG tablet Take 1 tablet (20 mg total) by mouth daily at 6 PM. (Patient not taking: Reported on 09/23/2016) 30 tablet 3   No facility-administered medications prior to visit.     ROS Review of Systems Constitutional: Negative for activity change, appetite change and fatigue.  HENT: Negative for congestion, sinus pressure and sore throat.   Eyes: Negative for visual disturbance.  Respiratory: Negative for cough, chest tightness, shortness of breath and wheezing.   Cardiovascular: Negative for chest pain and palpitations.  Gastrointestinal: Negative for abdominal distention, abdominal pain and constipation.  Endocrine: Negative for polydipsia.  Genitourinary: Negative for dysuria and frequency.  Musculoskeletal: Negative for arthralgias and back pain.  Skin: Negative for rash.  Neurological: Negative for tremors, light-headedness and numbness.  Hematological: Does not bruise/bleed easily.  Psychiatric/Behavioral: Positive for dysphoric mood. Negative for agitation and behavioral problems.   Objective:  BP (!) 157/72   Pulse 67   Temp 98.3 F (36.8 C) (Oral)   Ht '5\' 2"'  (1.575 m)   Wt 129 lb 9.6 oz (58.8 kg)   SpO2 100%   BMI 23.70 kg/m   BP/Weight 09/23/2016 08/11/6965 09/19/3808  Systolic BP 175 102 585  Diastolic BP 72 71 93  Wt. (Lbs) 129.6 128 124.4  BMI 23.7 23.41 22.75  Some encounter information is confidential and restricted. Go to Review Flowsheets activity to see all data.      Physical Exam Constitutional: She is oriented to person, place, and time. She appears well-developed  and well-nourished.  Cardiovascular: Normal rate, normal heart sounds and intact distal pulses.   No murmur heard. Pulmonary/Chest: Effort normal and breath sounds normal. She has no wheezes. She has no rales. She exhibits no tenderness.  Abdominal: Soft. Bowel sounds are normal. She exhibits no distension and no mass. There is no tenderness.  Healed scars from laparoscopic cholecystectomy Musculoskeletal: Normal range of motion.  Neurological: She is alert and oriented to person, place, and time.  Skin: Skin is warm and dry.  Psychiatric: dysphoric mood.  Assessment & Plan:   1. Type 2 diabetes mellitus with hyperglycemia, with long-term current use of insulin (HCC) Uncontrolled with A1c of 8.9 which has trended up from 7.1 previously due to noncompliance 8 units of NovoLog administered due to CBG of 370 Emphasize compliance with medications - POCT glucose (manual entry) - POCT glycosylated hemoglobin (Hb A1C) - insulin aspart (novoLOG) injection 8 Units; Inject 0.08 mLs (8 Units total) into the skin once.  2. Diabetes mellitus  due to underlying condition with hyperosmolarity without coma, with long-term current use of insulin (HCC) - metFORMIN (GLUCOPHAGE) 1000 MG tablet; Take 1 tablet (1,000 mg total) by mouth 2 (two) times daily.  Dispense: 60 tablet; Refill: 3 - glipiZIDE (GLUCOTROL) 10 MG tablet; Take 1 tablet (10 mg total) by mouth 2 (two) times daily before a meal.  Dispense: 60 tablet; Refill: 3 - insulin glargine (LANTUS) 100 UNIT/ML injection; Inject 0.15 mLs (15 Units total) into the skin at bedtime.  Dispense: 10 mL; Refill: 3  3. Schizoaffective disorder, depressive type ([place]) Not in crisis at the moment. LCSW called in for therapy Advised to keep appointment with [facility] services  4. Essential hypertension Uncontrolled due to noncompliance  low-sodium diet - lisinopril (PRINIVIL,ZESTRIL) 20 MG tablet; Take 1 tablet (20 mg total) by mouth daily.  Dispense: 30 tablet;  Refill: 3  5. HYPERCHOLESTEROLEMIA Uncontrolled Low-cholesterol diet - simvastatin (ZOCOR) 20 MG tablet; Take 1 tablet (20 mg total) by mouth daily at 6 PM.  Dispense: 30 tablet; Refill: 3  6. Other diabetic neurological complication associated with type 2 diabetes mellitus (HCC) Stable - gabapentin (NEURONTIN) 300 MG capsule; Take 1 capsule (300 mg total) by mouth 3 (three) times daily.  Dispense: 90 capsule; Refill: 3  7. Screening for breast cancer - MM Digital Screening; Future  8. Bereavement Encouraged to seek help with hospice for grief counseling LCSW to provide resources for outpatient   9. Screening for colon cancer - Fecal occult blood, imunochemical   Meds ordered this encounter  Medications  . insulin aspart (novoLOG) injection 8 Units  . metFORMIN (GLUCOPHAGE) 1000 MG tablet    Sig: Take 1 tablet (1,000 mg total) by mouth 2 (two) times daily.    Dispense:  60 tablet    Refill:  3  . glipiZIDE (GLUCOTROL) 10 MG tablet    Sig: Take 1 tablet (10 mg total) by mouth 2 (two) times daily before a meal.    Dispense:  60 tablet    Refill:  3  . insulin glargine (LANTUS) 100 UNIT/ML injection    Sig: Inject 0.15 mLs (15 Units total) into the skin at bedtime.    Dispense:  10 mL    Refill:  3  . lisinopril (PRINIVIL,ZESTRIL) 20 MG tablet    Sig: Take 1 tablet (20 mg total) by mouth daily.    Dispense:  30 tablet    Refill:  3  . simvastatin (ZOCOR) 20 MG tablet    Sig: Take 1 tablet (20 mg total) by mouth daily at 6 PM.    Dispense:  30 tablet    Refill:  3  . gabapentin (NEURONTIN) 300 MG capsule    Sig: Take 1 capsule (300 mg total) by mouth 3 (three) times daily.    Dispense:  90 capsule    Refill:  3    Follow-up: Return in about 1 month (around 10/24/2016) for Pap smear.   This note has been created with Surveyor, quantity. Any transcriptional errors are unintentional.     Arnoldo Morale MD

## 2016-10-07 ENCOUNTER — Emergency Department (HOSPITAL_COMMUNITY)
Admission: EM | Admit: 2016-10-07 | Discharge: 2016-10-07 | Disposition: A | Discharge status: Home / Self Care | Payer: Medicaid Other | Attending: Emergency Medicine | Admitting: Emergency Medicine

## 2016-10-07 ENCOUNTER — Encounter (HOSPITAL_COMMUNITY): Payer: Self-pay | Admitting: Emergency Medicine

## 2016-10-07 DIAGNOSIS — Z794 Long term (current) use of insulin: Secondary | ICD-10-CM

## 2016-10-07 DIAGNOSIS — E0800 Diabetes mellitus due to underlying condition with hyperosmolarity without nonketotic hyperglycemic-hyperosmolar coma (NKHHC): Secondary | ICD-10-CM

## 2016-10-07 DIAGNOSIS — R739 Hyperglycemia, unspecified: Secondary | ICD-10-CM

## 2016-10-07 DIAGNOSIS — E1149 Type 2 diabetes mellitus with other diabetic neurological complication: Secondary | ICD-10-CM

## 2016-10-07 DIAGNOSIS — R531 Weakness: Secondary | ICD-10-CM | POA: Diagnosis present

## 2016-10-07 DIAGNOSIS — I10 Essential (primary) hypertension: Secondary | ICD-10-CM | POA: Diagnosis not present

## 2016-10-07 DIAGNOSIS — E1165 Type 2 diabetes mellitus with hyperglycemia: Secondary | ICD-10-CM | POA: Insufficient documentation

## 2016-10-07 DIAGNOSIS — F17210 Nicotine dependence, cigarettes, uncomplicated: Secondary | ICD-10-CM | POA: Diagnosis not present

## 2016-10-07 LAB — BASIC METABOLIC PANEL
Anion gap: 8 (ref 5–15)
BUN: 12 mg/dL (ref 6–20)
CO2: 28 mmol/L (ref 22–32)
Calcium: 9.7 mg/dL (ref 8.9–10.3)
Chloride: 99 mmol/L — ABNORMAL LOW (ref 101–111)
Creatinine, Ser: 0.95 mg/dL (ref 0.44–1.00)
GFR calc Af Amer: >60 mL/min (ref >60)
GFR calc non Af Amer: >60 mL/min (ref >60)
Glucose, Bld: 433 mg/dL — ABNORMAL HIGH (ref 65–99)
Potassium: 3.9 mmol/L (ref 3.5–5.1)
Sodium: 135 mmol/L (ref 135–145)

## 2016-10-07 LAB — URINALYSIS, ROUTINE W REFLEX MICROSCOPIC
Bilirubin Urine: NEGATIVE
Glucose, UA: >=500 mg/dL — AB
Hgb urine dipstick: NEGATIVE
Ketones, ur: NEGATIVE mg/dL
Leukocytes, UA: NEGATIVE
Nitrite: NEGATIVE
Protein, ur: NEGATIVE mg/dL
Specific Gravity, Urine: 1.015 (ref 1.005–1.030)
pH: 5.5 (ref 5.0–8.0)

## 2016-10-07 LAB — CBC
HCT: 34.9 % — ABNORMAL LOW (ref 36.0–46.0)
Hemoglobin: 12.2 g/dL (ref 12.0–15.0)
MCH: 30 pg (ref 26.0–34.0)
MCHC: 35 g/dL (ref 30.0–36.0)
MCV: 86 fL (ref 78.0–100.0)
Platelets: 284 10*3/uL (ref 150–400)
RBC: 4.06 MIL/uL (ref 3.87–5.11)
RDW: 12.8 % (ref 11.5–15.5)
WBC: 5.7 10*3/uL (ref 4.0–10.5)

## 2016-10-07 LAB — CBG MONITORING, ED
Glucose-Capillary: 409 mg/dL — ABNORMAL HIGH (ref 65–99)
Glucose-Capillary: 315 mg/dL — ABNORMAL HIGH (ref 65–99)
Glucose-Capillary: 342 mg/dL — ABNORMAL HIGH (ref 65–99)

## 2016-10-07 MED ORDER — LISINOPRIL 20 MG PO TABS: 20.0000 mg | ORAL_TABLET | Freq: Every day | ORAL | 3 refills | Status: DC

## 2016-10-07 MED ORDER — METFORMIN HCL 1000 MG PO TABS: 1000.0000 mg | ORAL_TABLET | Freq: Two times a day (BID) | ORAL | 3 refills | Status: DC

## 2016-10-07 MED ORDER — LISINOPRIL 5 MG PO TABS
5.0000 mg | ORAL_TABLET | Freq: Once | ORAL | Status: AC
  Administered 2016-10-07: 5 mg via ORAL
  Filled 2016-10-07: qty 1

## 2016-10-07 MED ORDER — GABAPENTIN 300 MG PO CAPS: 300.0000 mg | ORAL_CAPSULE | Freq: Three times a day (TID) | ORAL | 3 refills | Status: DC

## 2016-10-07 MED ORDER — SODIUM CHLORIDE 0.9 % IV BOLUS (SEPSIS)
1000.0000 mL | Freq: Once | INTRAVENOUS | Status: AC
  Administered 2016-10-07: 1000 mL via INTRAVENOUS

## 2016-10-07 MED ORDER — GLIPIZIDE 10 MG PO TABS: 10.0000 mg | ORAL_TABLET | Freq: Two times a day (BID) | ORAL | 3 refills | Status: DC

## 2016-10-07 MED ORDER — INSULIN GLARGINE 100 UNIT/ML ~~LOC~~ SOLN: 15.0000 [IU] | Freq: Every day | SUBCUTANEOUS | 3 refills | Status: DC

## 2016-10-07 NOTE — ED Provider Notes (Signed)
[facility] DEPT Provider Note   CSN: [id number] Arrival date & time: 10/07/16  1656     History   Chief Complaint Chief Complaint  Patient presents with  . Hyperglycemia    HPI Lorraine Turner is a 56 y.o. female.  HPI Patient, with a past medical history of diabetes, hypertension, schizophrenia, bipolar 1 disorder, presents to ED for evaluation of hyperglycemia. She states that she has not taken her diabetes medications and insulin for the past 5 months due to not being able to afford them. She also reports generalized weakness, fatigue, constipation. She also reports that she is under more stress than usual after her husband passed away 5 months. She denies any chest pain, shortness of breath, headache, vision changes, vomiting, abdominal pain, urinary symptoms.  Past Medical History:  Diagnosis Date  . Anxiety   . Bipolar 1 disorder ([place])   . Depression   . Diabetes mellitus without complication ([place])   . Gallstones   . Hyperlipidemia   . Hypertension   . Neuropathy   . Schizophrenia ([place])   . Seizures ([place])    X1- febrile seizure as a child-none since    Patient Active Problem List   Diagnosis Date Noted  . Umbilical hernia with obstruction 11/24/2015  . Symptomatic cholelithiasis 11/24/2015  . Type 2 diabetes mellitus with hyperglycemia ([place])   . Schizoaffective disorder, depressive type ([place]) 08/14/2011  . UNSPECIFIED ANEMIA 01/23/2009  . Essential hypertension 10/12/2008  . HYPERCHOLESTEROLEMIA 01/01/2007  . PANIC DISORDER 01/01/2007  . Obsessive compulsive disorder 01/01/2007    Past Surgical History:  Procedure Laterality Date  . CESAREAN SECTION  05/1998   X 1  . CHOLECYSTECTOMY N/A 03/27/2016   Procedure: LAPAROSCOPIC CHOLECYSTECTOMY;  Surgeon: Olean Ree, MD;  Location: [facility] ORS;  Service: General;  Laterality: N/A;  . TONSILLECTOMY AND ADENOIDECTOMY     age 52  . TUBAL LIGATION      OB History    No data available       Home  Medications    Prior to Admission medications   Medication Sig Start Date End Date Taking? Authorizing Provider  Blood Glucose Monitoring Suppl (TRUE METRIX METER) w/Device KIT 1 Device by Does not apply route 3 (three) times daily. Patient not taking: Reported on 09/23/2016 08/17/15   Argentina Donovan, PA-C  busPIRone (BUSPAR) 7.5 MG tablet Take 1 tablet (7.5 mg total) by mouth 2 (two) times daily. Reported on 05/31/2015 Patient not taking: Reported on 09/23/2016 06/17/16   Arnoldo Morale, MD  gabapentin (NEURONTIN) 300 MG capsule Take 1 capsule (300 mg total) by mouth 3 (three) times daily. 10/07/16   Lakely Elmendorf, PA-C  glipiZIDE (GLUCOTROL) 10 MG tablet Take 1 tablet (10 mg total) by mouth 2 (two) times daily before a meal. 10/07/16   Ellisyn Icenhower, PA-C  insulin glargine (LANTUS) 100 UNIT/ML injection Inject 0.15 mLs (15 Units total) into the skin at bedtime. 10/07/16   Kouper Spinella, Nicanor Alcon, PA-C  Insulin Syringe-Needle U-100 31G X 5/16" 0.3 ML MISC 1 each by Does not apply route 4 (four) times daily -  before meals and at bedtime. Patient not taking: Reported on 09/23/2016 08/30/15   Arnoldo Morale, MD  lisinopril (PRINIVIL,ZESTRIL) 20 MG tablet Take 1 tablet (20 mg total) by mouth daily. 10/07/16   Marguarite Markov, PA-C  metFORMIN (GLUCOPHAGE) 1000 MG tablet Take 1 tablet (1,000 mg total) by mouth 2 (two) times daily. 10/07/16   Quantez Schnyder, Nicanor Alcon, PA-C  Multiple Vitamin (MULTIVITAMIN) tablet Take 1 tablet  by mouth daily. Reported on 08/30/2015 Patient not taking: Reported on 09/23/2016 04/09/16   Arnoldo Morale, MD  QUEtiapine (SEROQUEL) 50 MG tablet Take 3 tablets (150 mg total) by mouth at bedtime. For mood control/depression Patient not taking: Reported on 09/23/2016 05/11/14   Rankin, Shuvon B, NP  sertraline (ZOLOFT) 100 MG tablet Take 1 tablet (100 mg total) by mouth daily. For depression Patient not taking: Reported on 09/23/2016 06/17/16   Arnoldo Morale, MD  simvastatin (ZOCOR) 20 MG tablet Take 1 tablet (20 mg total) by  mouth daily at 6 PM. Patient not taking: Reported on 10/07/2016 09/23/16   Arnoldo Morale, MD  traZODone (DESYREL) 50 MG tablet TAKE 1 TABLET BY MOUTH AT BEDTIME AS NEEDED FOR SLEEP. Patient not taking: Reported on 09/23/2016 06/17/16   Arnoldo Morale, MD  TRUEPLUS LANCETS 28G MISC 1 each by Does not apply route 3 (three) times daily. Patient not taking: Reported on 09/23/2016 04/09/16   Arnoldo Morale, MD    Family History Family History  Problem Relation Age of Onset  . Diabetes Mother   . Heart disease Mother   . Kidney disease Mother   . Stroke Mother   . Hyperlipidemia Mother   . Diabetes Maternal Aunt     Social History Social History  Substance Use Topics  . Smoking status: Current Some Day Smoker    Types: Cigarettes    Last attempt to quit: 03/14/2013  . Smokeless tobacco: Never Used     Comment: 1 cigarette1-2 times year, only when she gets stressed  . Alcohol use 0.6 oz/week    1 Cans of beer per week     Comment: rare     Allergies   Patient has no known allergies.   Review of Systems Review of Systems  Constitutional: Positive for fatigue. Negative for appetite change, chills and fever.  HENT: Negative for ear pain, rhinorrhea, sneezing and sore throat.   Eyes: Negative for photophobia and visual disturbance.  Respiratory: Negative for cough, chest tightness, shortness of breath and wheezing.   Cardiovascular: Negative for chest pain and palpitations.  Gastrointestinal: Positive for constipation. Negative for abdominal pain, blood in stool, diarrhea, nausea and vomiting.  Genitourinary: Negative for dysuria, hematuria and urgency.  Musculoskeletal: Negative for myalgias.  Skin: Negative for rash.  Neurological: Positive for dizziness and light-headedness. Negative for weakness.     Physical Exam Updated Vital Signs BP 139/76 (BP Location: Left Arm)   Pulse 73   Temp 99 F (37.2 C) (Oral)   Resp 18   Ht '5\' 2"'  (1.575 m)   Wt 57.6 kg (127 lb)   SpO2 100%    BMI 23.23 kg/m   Physical Exam  Constitutional: She appears well-developed and well-nourished. No distress.  Nontoxic appearing and in no acute distress.  HENT:  Head: Normocephalic and atraumatic.  Nose: Nose normal.  Eyes: Pupils are equal, round, and reactive to light. Conjunctivae and EOM are normal. Right eye exhibits no discharge. Left eye exhibits no discharge. No scleral icterus.  Neck: Normal range of motion. Neck supple.  Cardiovascular: Normal rate, regular rhythm, normal heart sounds and intact distal pulses.  Exam reveals no gallop and no friction rub.   No murmur heard. Pulmonary/Chest: Effort normal and breath sounds normal. No respiratory distress.  Abdominal: Soft. Bowel sounds are normal. She exhibits no distension. There is no tenderness. There is no guarding.  Musculoskeletal: Normal range of motion. She exhibits no edema.  Neurological: She is alert. No cranial nerve  deficit or sensory deficit. She exhibits normal muscle tone. Coordination normal.  Alert and oriented to person, place, year and current events.  Skin: Skin is warm and dry. No rash noted.  Psychiatric: She has a normal mood and affect.  Nursing note and vitals reviewed.    ED Treatments / Results  Labs (all labs ordered are listed, but only abnormal results are displayed) Labs Reviewed  BASIC METABOLIC PANEL - Abnormal; Notable for the following:       Result Value   Chloride 99 (*)    Glucose, Bld 433 (*)    All other components within normal limits  CBC - Abnormal; Notable for the following:    HCT 34.9 (*)    All other components within normal limits  URINALYSIS, ROUTINE W REFLEX MICROSCOPIC - Abnormal; Notable for the following:    Glucose, UA >=500 (*)    Bacteria, UA RARE (*)    Squamous Epithelial / LPF 0-5 (*)    All other components within normal limits  CBG MONITORING, ED - Abnormal; Notable for the following:    Glucose-Capillary 409 (*)    All other components within normal  limits  CBG MONITORING, ED - Abnormal; Notable for the following:    Glucose-Capillary 315 (*)    All other components within normal limits  CBG MONITORING, ED - Abnormal; Notable for the following:    Glucose-Capillary 342 (*)    All other components within normal limits    EKG  EKG Interpretation None       Radiology No results found.  Procedures Procedures (including critical care time)  Medications Ordered in ED Medications  sodium chloride 0.9 % bolus 1,000 mL (0 mLs Intravenous Stopped 10/07/16 1905)  lisinopril (PRINIVIL,ZESTRIL) tablet 5 mg (5 mg Oral Given 10/07/16 1809)  sodium chloride 0.9 % bolus 1,000 mL (0 mLs Intravenous Stopped 10/07/16 2122)     Initial Impression / Assessment and Plan / ED Course  I have reviewed the triage vital signs and the nursing notes.  Pertinent labs & imaging results that were available during my care of the patient were reviewed by me and considered in my medical decision making (see chart for details).  Clinical Course as of Oct 07 2245  Mon Oct 07, 2016  1723 Patient not in room when I went in for initial assessment.  [HK]    Clinical Course User Index [HK] Delia Heady, PA-C    Patient presents to ED for evaluation of hyperglycemia. She states that she has been unable to take any of her medications for the past 5 months due to financial issues. She states that her husband passed away 5 months ago and she has been experiencing increasing stress. She denies any other symptoms at this time however she states that she has been feeling generally weak and reports constipation that have resolved upon arrival to the ED. On initial assessment her glucose was 409. No abdominal tenderness to palpation. BMP unremarkable with normal anion gap. Urinalysis with no ketones. No evidence of DKA. Patient given fluids and lisinopril here. Patient does have a primary care provider at [facility] which she does see regularly. Spoke to case  management who states that since patient has PCP and Medicaid that they will not be able to provide financial assistance however she should follow up with her PCP for further evaluation and management will give prescriptions for medications and encourage patient to follow-up with PCP and to get medications filled as soon as possible.  Patient appears stable for discharge at this time. Strict return precautions given.  Final Clinical Impressions(s) / ED Diagnoses   Final diagnoses:  Hyperglycemia    New Prescriptions Discharge Medication List as of 10/07/2016  9:56 PM       Delia Heady, PA-C 10/07/16 2251    Malvin Johns, MD 10/07/16 2321

## 2016-10-07 NOTE — ED Notes (Signed)
Bed: [facility] Expected date:  Expected time:  Means of arrival:  Comments: 

## 2016-10-07 NOTE — Discharge Instructions (Signed)
Please read attached information regarding your condition. Follow-up with primary care at [facility] tomorrow. Continue home medications as previously prescribed. Return to ED for severe abdominal pain, head injury, loss of consciousness, numbness, vomiting.

## 2016-10-07 NOTE — Care Management (Signed)
ED CM received call from EDP concerning patient needing medication assistance.  CM noted that patient has [place] Medicaid and is an existing patient with [facility]. Last visit 09/23/16. CM suggested that patient contact her PCP at the [facility] concerning her barriers with medications. CM will contact [place] CM.

## 2016-10-07 NOTE — ED Triage Notes (Signed)
Per EMS, states she has not been taking DM meds for over 5 months-states her husband died and she lost her ability to afford her medication-CBG read high on EMS monitor

## 2016-10-16 IMAGING — CT CT ABD-PELV W/ CM
1 of 5 series · 10 of 46 positions shown, 15 images · IV contrast (iopamidol)
Comparison: None.

CLINICAL DATA: Diffuse abdominal pain and burning sensation for
week. Nausea, vomiting and diarrhea. History of hypertension,
diabetes.

EXAM:
CT ABDOMEN AND PELVIS WITH CONTRAST
TECHNIQUE: Multidetector CT imaging of the abdomen and pelvis was performed
using the standard protocol following bolus administration of
intravenous contrast.
CONTRAST:  100mL ZBML12-1TT IOPAMIDOL (ZBML12-1TT) INJECTION 61%

[Series 306: delays · axial · 0.68mm/px · z∈[-467,-313]mm · 10 of 91 slices shown, 15 images]
[im 7/91  soft-tissue]
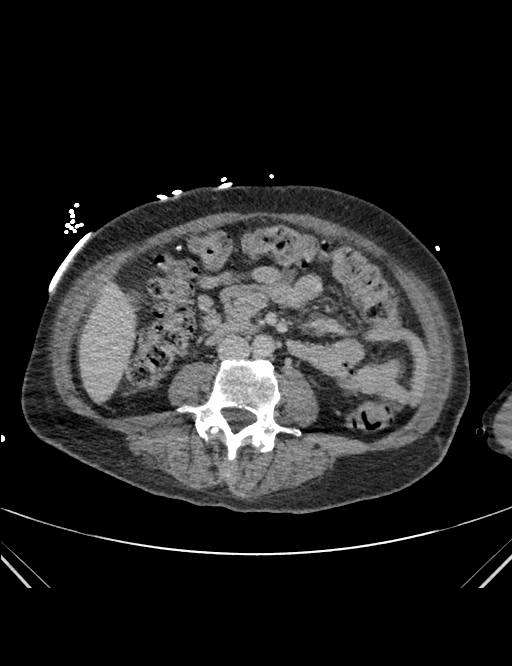
[im 7/91  bone]
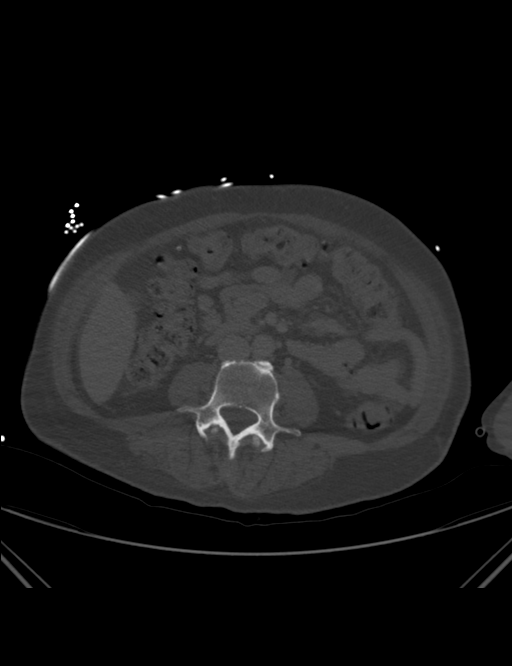
[im 21/91  soft-tissue]
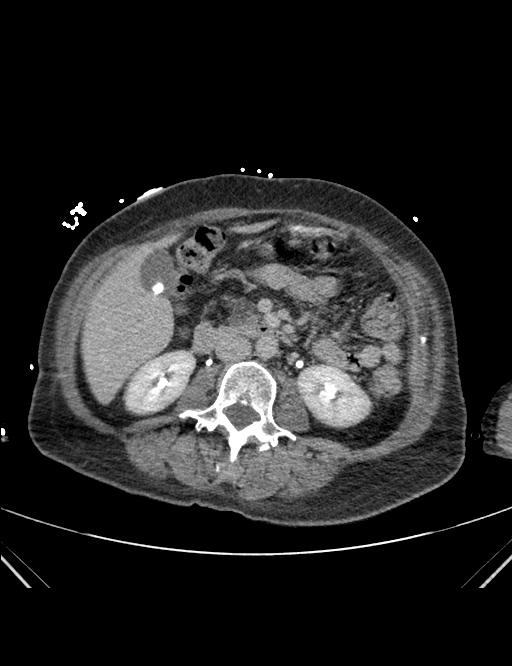
[im 28/91  soft-tissue]
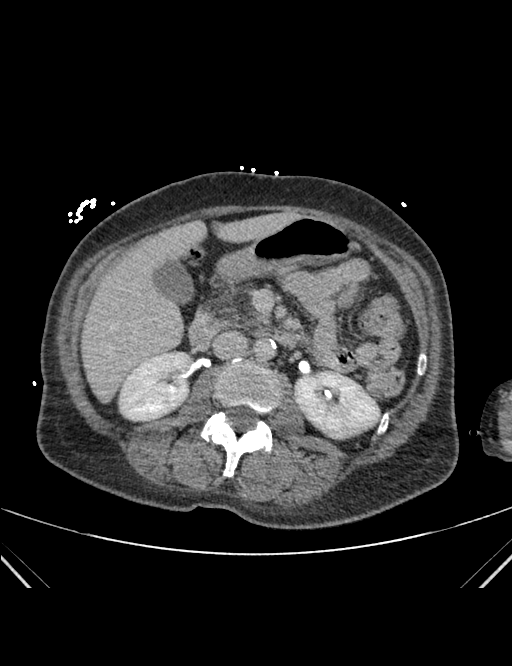
[im 35/91  soft-tissue]
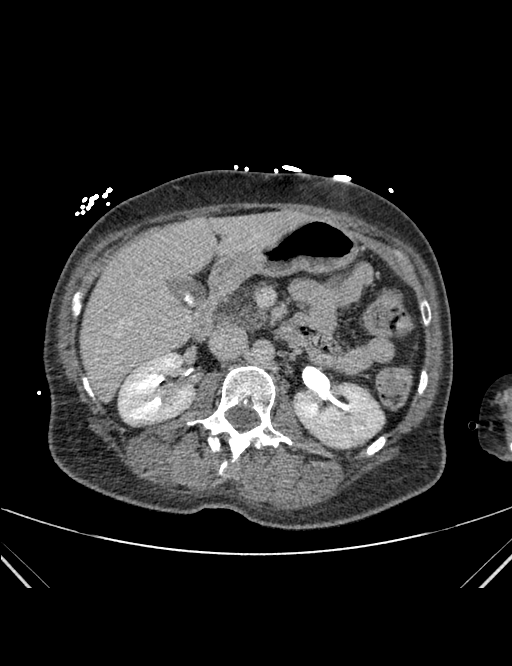
[im 49/91  soft-tissue]
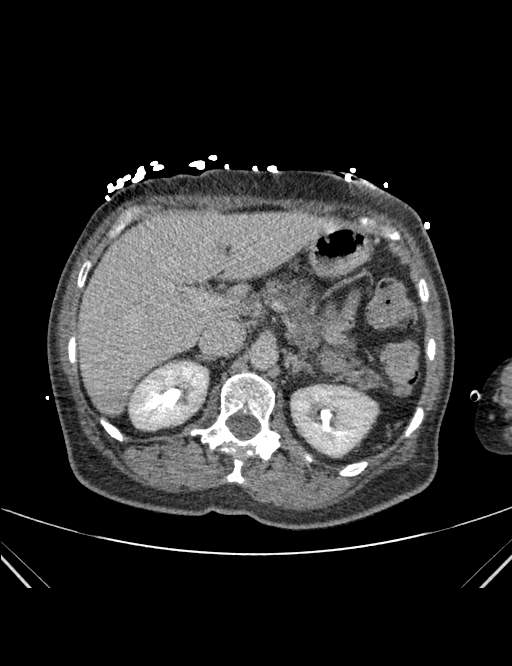
[im 56/91  soft-tissue]
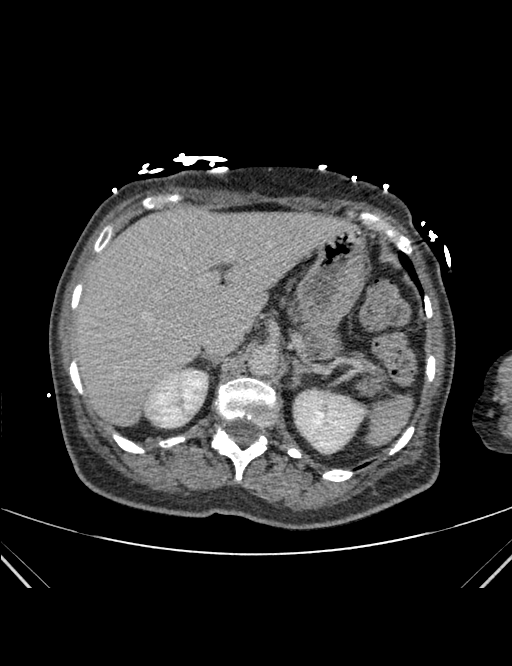
[im 63/91  soft-tissue]
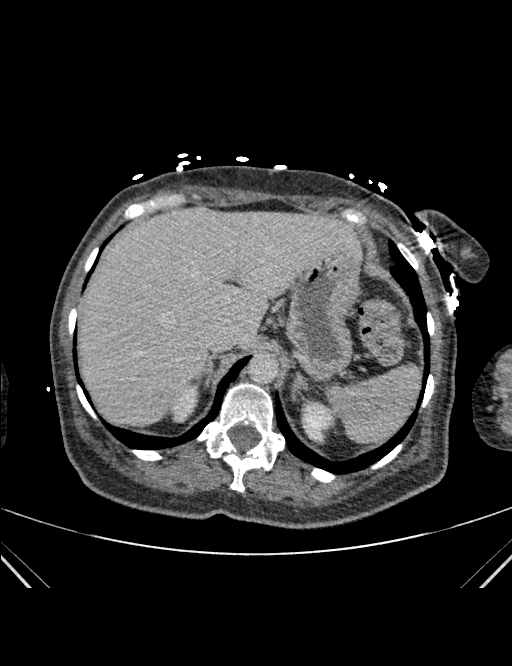
[im 63/91  lung]
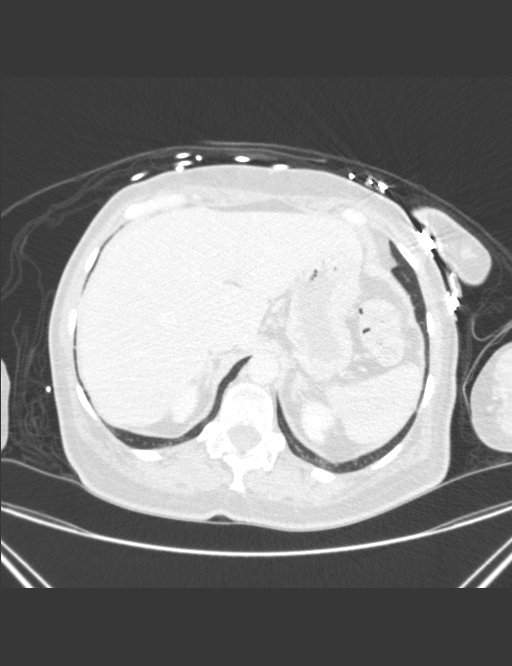
[im 70/91  lung]
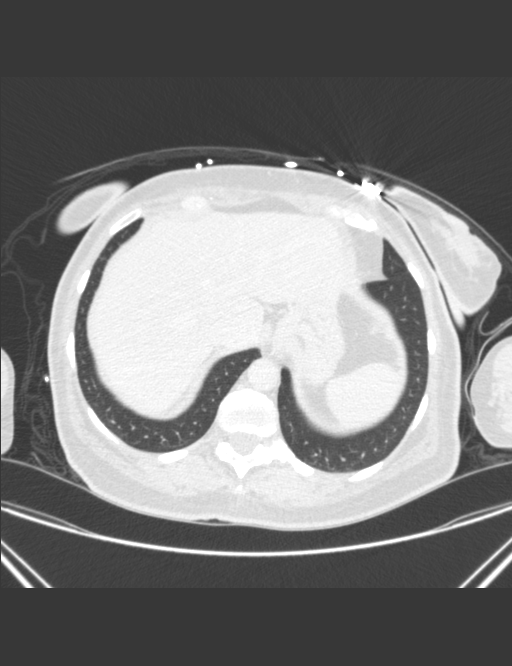
[im 77/91  soft-tissue]
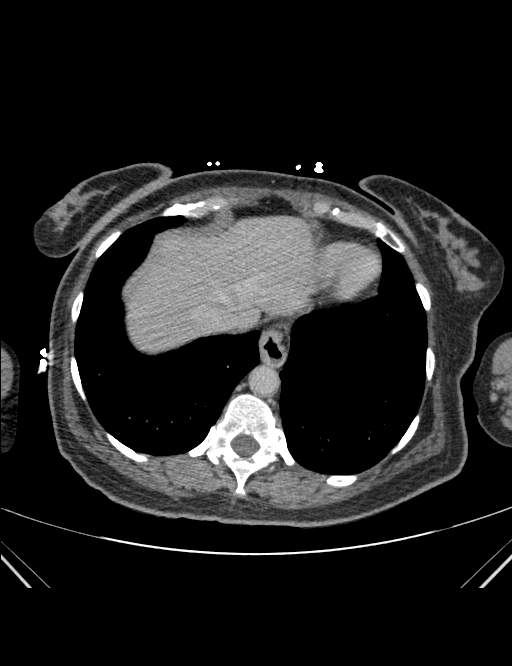
[im 77/91  lung]
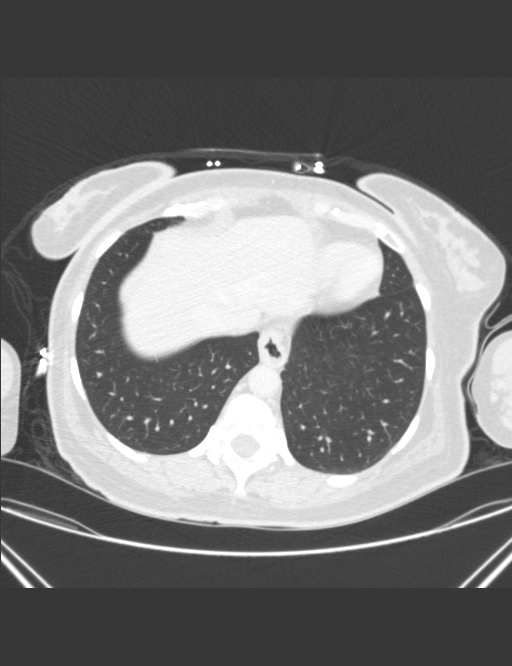
[im 84/91  soft-tissue]
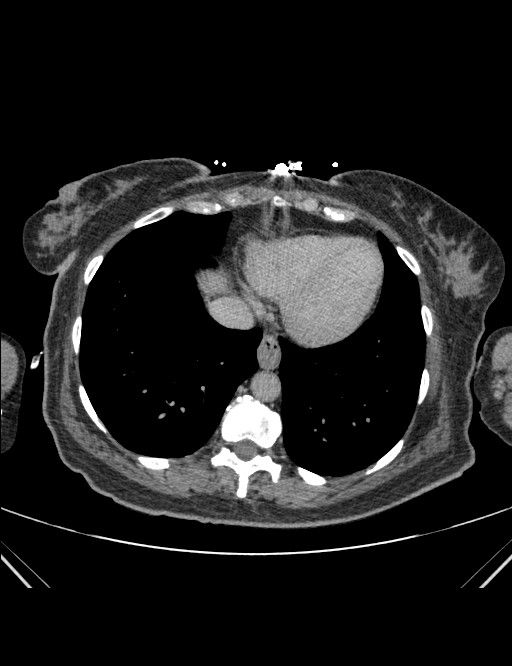
[im 84/91  lung]
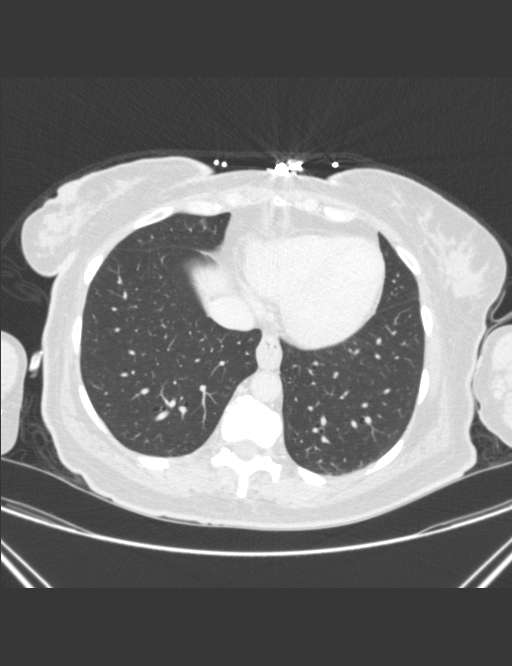
[im 84/91  bone]
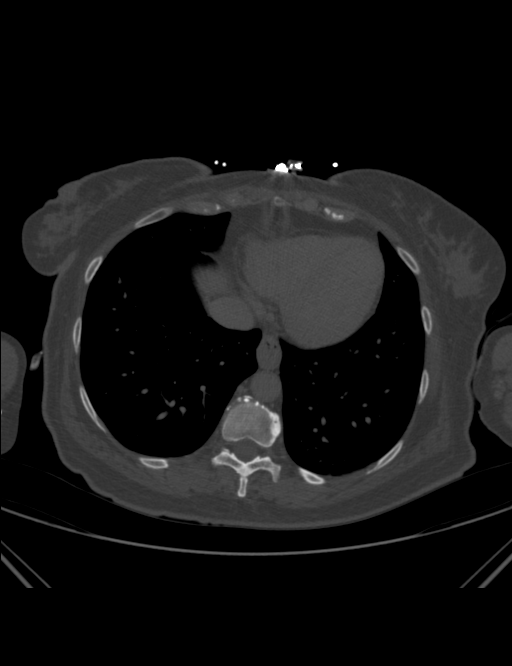

[10 of 46 positions shown; findings below may reference images not displayed]

FINDINGS: Moderate patient motion.

LUNG BASES: Included view of the lung bases are clear. Visualized
heart and pericardium are unremarkable.

SOLID ORGANS: The liver, spleen, and adrenal glands are
unremarkable. Two subcentimeter gallstones without CT findings of
acute cholecystitis. Fatty infiltration of the pancreatic head and
proximal body without acute component.

GASTROINTESTINAL TRACT: Small hiatal hernia. The stomach, small and
large bowel are normal in course and caliber without inflammatory
changes. The appendix is not discretely identified, however there
are no inflammatory changes in the right lower quadrant.

KIDNEYS/ URINARY TRACT: Kidneys are orthotopic, demonstrating
symmetric enhancement. No nephrolithiasis, hydronephrosis or solid
renal masses. The unopacified ureters are normal in course and
caliber. Delayed imaging through the kidneys demonstrates symmetric
prompt contrast excretion within the proximal urinary collecting
system. Urinary bladder is well distended and unremarkable.

PERITONEUM/RETROPERITONEUM: Aortoiliac vessels are normal in course
and caliber: Mild calcific atherosclerosis. No lymphadenopathy by CT
size criteria. Internal reproductive organs are unremarkable. No
intraperitoneal free fluid nor free air.

SOFT TISSUE/OSSEOUS STRUCTURES: Non-suspicious. Moderate bilateral
sacroiliac osteoarthrosis. Moderate L5-S1 facet arthropathy. Small
fat containing umbilical hernia.
IMPRESSION: No acute intra-abdominal or pelvic process on this moderate motion
degraded examination.

Cholelithiasis.

## 2017-01-21 ENCOUNTER — Other Ambulatory Visit: Payer: Self-pay

## 2017-01-21 ENCOUNTER — Emergency Department (HOSPITAL_COMMUNITY)
Admission: EM | Admit: 2017-01-21 | Discharge: 2017-01-21 | Disposition: A | Discharge status: Home / Self Care | Payer: Medicaid Other | Attending: Emergency Medicine | Admitting: Emergency Medicine

## 2017-01-21 ENCOUNTER — Encounter (HOSPITAL_COMMUNITY): Payer: Self-pay

## 2017-01-21 DIAGNOSIS — N899 Noninflammatory disorder of vagina, unspecified: Secondary | ICD-10-CM | POA: Diagnosis present

## 2017-01-21 DIAGNOSIS — Z79899 Other long term (current) drug therapy: Secondary | ICD-10-CM | POA: Diagnosis not present

## 2017-01-21 DIAGNOSIS — B3731 Acute candidiasis of vulva and vagina: Secondary | ICD-10-CM

## 2017-01-21 DIAGNOSIS — B373 Candidiasis of vulva and vagina: Secondary | ICD-10-CM | POA: Insufficient documentation

## 2017-01-21 DIAGNOSIS — I10 Essential (primary) hypertension: Secondary | ICD-10-CM | POA: Insufficient documentation

## 2017-01-21 DIAGNOSIS — E7800 Pure hypercholesterolemia, unspecified: Secondary | ICD-10-CM | POA: Insufficient documentation

## 2017-01-21 DIAGNOSIS — Z794 Long term (current) use of insulin: Secondary | ICD-10-CM | POA: Insufficient documentation

## 2017-01-21 DIAGNOSIS — E785 Hyperlipidemia, unspecified: Secondary | ICD-10-CM | POA: Insufficient documentation

## 2017-01-21 DIAGNOSIS — F17210 Nicotine dependence, cigarettes, uncomplicated: Secondary | ICD-10-CM | POA: Insufficient documentation

## 2017-01-21 DIAGNOSIS — E1165 Type 2 diabetes mellitus with hyperglycemia: Secondary | ICD-10-CM | POA: Insufficient documentation

## 2017-01-21 DIAGNOSIS — R739 Hyperglycemia, unspecified: Secondary | ICD-10-CM

## 2017-01-21 LAB — URINALYSIS, ROUTINE W REFLEX MICROSCOPIC
Bilirubin Urine: NEGATIVE
Glucose, UA: >=500 mg/dL — AB
Hgb urine dipstick: NEGATIVE
Ketones, ur: NEGATIVE mg/dL
Nitrite: NEGATIVE
Protein, ur: NEGATIVE mg/dL
Specific Gravity, Urine: 1.033 — ABNORMAL HIGH (ref 1.005–1.030)
Squamous Epithelial / LPF: NONE SEEN
pH: 6 (ref 5.0–8.0)

## 2017-01-21 LAB — WET PREP, GENITAL
Clue Cells Wet Prep HPF POC: NONE SEEN
Sperm: NONE SEEN
Trich, Wet Prep: NONE SEEN

## 2017-01-21 LAB — CBG MONITORING, ED: Glucose-Capillary: 383 mg/dL — ABNORMAL HIGH (ref 65–99)

## 2017-01-21 MED ORDER — FLUCONAZOLE 150 MG PO TABS
150.0000 mg | ORAL_TABLET | Freq: Once | ORAL | Status: AC
  Administered 2017-01-21: 150 mg via ORAL
  Filled 2017-01-21 (×2): qty 1

## 2017-01-21 NOTE — ED Notes (Signed)
CBG 383 

## 2017-01-21 NOTE — ED Triage Notes (Signed)
Per [facility], pt arrived per home and called EMS due to yeast infection x13 . Pt states tried "making a tampon out of garlic, one out of yogurt, and douching with apple cider vinegar." Pt reports no relief. Per PTAR, pt states can't take monostat because she has diabetes. Pt does not take her metformin because she's "heard bad things about it." Pt reports taking 15 units of Lantus an hour and half ago. Pt reports severe itching and burning. Pt reports "I scratch it until I'm a little swollen."

## 2017-01-21 NOTE — ED Notes (Signed)
ED Provider at bedside. 

## 2017-01-21 NOTE — Discharge Instructions (Signed)
There was evidence of yeast on the vaginal exam. This was treated with the antifungal medication here in the ED.  Please follow up with your PCP or OBGYN on this matter.  Your blood sugar was also noted to be high. Please take your prescribed medications, as directed.  Follow-up with your primary care provider on this matter.

## 2017-01-21 NOTE — ED Provider Notes (Signed)
[facility] EMERGENCY DEPARTMENT Provider Note   CSN: [id number] Arrival date & time: 01/21/17  0301     History   Chief Complaint Chief Complaint  Patient presents with  . Vaginal Itching  . Hyperglycemia    HPI DEVYNNE STURDIVANT is a 56 y.o. female.  HPI   Renuka Farfan Stlaurent is a 56 y.o. female, with a history of DM, bipolar, anxiety, HTN, and schizophrenia, presenting to the ED with vaginal itching for the last two weeks.  Increased white vaginal discharge. She has tried intravaginal garlic, yogurt, and vinegar, which give improvement while present, but symptoms then returned.  Stopped taking her DM medications in 05/21/2016 after her husband died.  She states, "I started to hear things about how metformin is bad for you.  I also had less interest in my health after he died."  Denies fever/chills, nausea/vomiting, vaginal bleeding, abdominal pain, dizziness, dysuria, hematuria, or any other complaints.     Past Medical History:  Diagnosis Date  . Anxiety   . Bipolar 1 disorder ([place])   . Depression   . Diabetes mellitus without complication ([place])   . Gallstones   . Hyperlipidemia   . Hypertension   . Neuropathy   . Schizophrenia ([place])   . Seizures ([place])    X1- febrile seizure as a child-none since    Patient Active Problem List   Diagnosis Date Noted  . Umbilical hernia with obstruction 11/24/2015  . Symptomatic cholelithiasis 11/24/2015  . Type 2 diabetes mellitus with hyperglycemia ([place])   . Schizoaffective disorder, depressive type ([place]) 08/14/2011  . UNSPECIFIED ANEMIA 01/23/2009  . Essential hypertension 10/12/2008  . HYPERCHOLESTEROLEMIA 01/01/2007  . PANIC DISORDER 01/01/2007  . Obsessive compulsive disorder 01/01/2007    Past Surgical History:  Procedure Laterality Date  . CESAREAN SECTION  05/1998   X 1  . CHOLECYSTECTOMY N/A 03/27/2016   Procedure: LAPAROSCOPIC CHOLECYSTECTOMY;  Surgeon: Olean Ree, MD;  Location: [facility]  ORS;  Service: General;  Laterality: N/A;  . TONSILLECTOMY AND ADENOIDECTOMY     age 65  . TUBAL LIGATION      OB History    No data available       Home Medications    Prior to Admission medications   Medication Sig Start Date End Date Taking? Authorizing Provider  Blood Glucose Monitoring Suppl (TRUE METRIX METER) w/Device KIT 1 Device by Does not apply route 3 (three) times daily. Patient not taking: Reported on 09/23/2016 08/17/15   Argentina Donovan, PA-C  busPIRone (BUSPAR) 7.5 MG tablet Take 1 tablet (7.5 mg total) by mouth 2 (two) times daily. Reported on 05/31/2015 Patient not taking: Reported on 09/23/2016 06/17/16   Arnoldo Morale, MD  gabapentin (NEURONTIN) 300 MG capsule Take 1 capsule (300 mg total) by mouth 3 (three) times daily. 10/07/16   Khatri, Hina, PA-C  glipiZIDE (GLUCOTROL) 10 MG tablet Take 1 tablet (10 mg total) by mouth 2 (two) times daily before a meal. 10/07/16   Khatri, Hina, PA-C  insulin glargine (LANTUS) 100 UNIT/ML injection Inject 0.15 mLs (15 Units total) into the skin at bedtime. 10/07/16   Khatri, Nicanor Alcon, PA-C  Insulin Syringe-Needle U-100 31G X 5/16" 0.3 ML MISC 1 each by Does not apply route 4 (four) times daily -  before meals and at bedtime. Patient not taking: Reported on 09/23/2016 08/30/15   Arnoldo Morale, MD  lisinopril (PRINIVIL,ZESTRIL) 20 MG tablet Take 1 tablet (20 mg total) by mouth daily. 10/07/16   Shelly Coss,  Hina, PA-C  metFORMIN (GLUCOPHAGE) 1000 MG tablet Take 1 tablet (1,000 mg total) by mouth 2 (two) times daily. 10/07/16   Khatri, Hina, PA-C  Multiple Vitamin (MULTIVITAMIN) tablet Take 1 tablet by mouth daily. Reported on 08/30/2015 Patient not taking: Reported on 09/23/2016 04/09/16   Arnoldo Morale, MD  QUEtiapine (SEROQUEL) 50 MG tablet Take 3 tablets (150 mg total) by mouth at bedtime. For mood control/depression Patient not taking: Reported on 09/23/2016 05/11/14   Rankin, Shuvon B, NP  sertraline (ZOLOFT) 100 MG tablet Take 1 tablet (100 mg total)  by mouth daily. For depression Patient not taking: Reported on 09/23/2016 06/17/16   Arnoldo Morale, MD  simvastatin (ZOCOR) 20 MG tablet Take 1 tablet (20 mg total) by mouth daily at 6 PM. Patient not taking: Reported on 10/07/2016 09/23/16   Arnoldo Morale, MD  traZODone (DESYREL) 50 MG tablet TAKE 1 TABLET BY MOUTH AT BEDTIME AS NEEDED FOR SLEEP. Patient not taking: Reported on 09/23/2016 06/17/16   Arnoldo Morale, MD  TRUEPLUS LANCETS 28G MISC 1 each by Does not apply route 3 (three) times daily. Patient not taking: Reported on 09/23/2016 04/09/16   Arnoldo Morale, MD    Family History Family History  Problem Relation Age of Onset  . Diabetes Mother   . Heart disease Mother   . Kidney disease Mother   . Stroke Mother   . Hyperlipidemia Mother   . Diabetes Maternal Aunt     Social History Social History   Tobacco Use  . Smoking status: Current Some Day Smoker    Types: Cigarettes    Last attempt to quit: 03/14/2013    Years since quitting: 3.8  . Smokeless tobacco: Never Used  . Tobacco comment: 1 cigarette1-2 times year, only when she gets stressed  Substance Use Topics  . Alcohol use: Yes    Alcohol/week: 0.6 oz    Types: 1 Cans of beer per week    Comment: rare  . Drug use: No     Allergies   Patient has no known allergies.   Review of Systems Review of Systems  Constitutional: Negative for chills and fever.  Respiratory: Negative for shortness of breath.   Cardiovascular: Negative for chest pain.  Gastrointestinal: Negative for abdominal pain, diarrhea, nausea and vomiting.  Genitourinary: Positive for vaginal discharge. Negative for dysuria, frequency and hematuria.       Vaginal itching  Neurological: Negative for dizziness and light-headedness.  All other systems reviewed and are negative.    Physical Exam Updated Vital Signs BP (!) 145/65 (BP Location: Right Arm)   Pulse 63   Temp 98 F (36.7 C) (Oral)   Resp 14   Ht '5\' 2"'  (1.575 m)   Wt 61.2 kg (135 lb)    SpO2 100%   BMI 24.69 kg/m   Physical Exam  Constitutional: She appears well-developed and well-nourished. No distress.  HENT:  Head: Normocephalic and atraumatic.  Mouth/Throat: Oropharynx is clear and moist.  Eyes: Conjunctivae are normal.  Neck: Neck supple.  Cardiovascular: Normal rate, regular rhythm, normal heart sounds and intact distal pulses.  Pulmonary/Chest: Effort normal and breath sounds normal. No respiratory distress.  Abdominal: Soft. There is no tenderness. There is no guarding.  Genitourinary:  Genitourinary Comments: External genitalia abnormal - White material coating the labia and inguinal surfaces bilaterally. Patient states this is probably yogurt. Vagina with discharge - Thick, white, clumping exudate Cervix  abnormal  - mildly friable negative for cervical motion tenderness Adnexa palpated, no masses,  negative for tenderness noted Bladder palpated negative for tenderness Uterus palpated no masses, negative for tenderness  No inguinal lymphadenopathy. Otherwise normal female genitalia. RN/Med Tech served as chaperone during exam.  Musculoskeletal: She exhibits no edema.  Lymphadenopathy:    She has no cervical adenopathy.  Neurological: She is alert.  Skin: Skin is warm and dry. She is not diaphoretic.  Psychiatric: She has a normal mood and affect. Her behavior is normal.  Nursing note and vitals reviewed.    ED Treatments / Results  Labs (all labs ordered are listed, but only abnormal results are displayed) Labs Reviewed  WET PREP, GENITAL - Abnormal; Notable for the following components:      Result Value   Yeast Wet Prep HPF POC PRESENT (*)    WBC, Wet Prep HPF POC MANY (*)    All other components within normal limits  URINALYSIS, ROUTINE W REFLEX MICROSCOPIC - Abnormal; Notable for the following components:   Specific Gravity, Urine 1.033 (*)    Glucose, UA >=500 (*)    Leukocytes, UA MODERATE (*)    Bacteria, UA RARE (*)    All other  components within normal limits  CBG MONITORING, ED - Abnormal; Notable for the following components:   Glucose-Capillary 383 (*)    All other components within normal limits    EKG  EKG Interpretation None       Radiology No results found.  Procedures Pelvic exam Date/Time: 01/21/2017 3:53 AM Performed by: Lorayne Bender, PA-C Authorized by: Lorayne Bender, PA-C  Procedure consent: procedure consent matches procedure scheduled Patient identity confirmed: verbally with patient and provided demographic data Local anesthesia used: no  Anesthesia: Local anesthesia used: no  Sedation: Patient sedated: no  Patient tolerance: Patient tolerated the procedure well with no immediate complications    (including critical care time)  Medications Ordered in ED Medications  fluconazole (DIFLUCAN) tablet 150 mg (150 mg Oral Given 01/21/17 0436)     Initial Impression / Assessment and Plan / ED Course  I have reviewed the triage vital signs and the nursing notes.  Pertinent labs & imaging results that were available during my care of the patient were reviewed by me and considered in my medical decision making (see chart for details).     Patient presents with vaginal itching and discharge.  Evidence of yeast on wet prep.  Treated with Diflucan here in the ED. Patient's hyperglycemia is noted.  Appears to be asymptomatic at this time.  Counseled patient on known possible side effects of her medication as well as the importance of medication compliance.  Recommend close PCP follow-up. The patient was given instructions for home care as well as return precautions. Patient voices understanding of these instructions, accepts the plan, and is comfortable with discharge.  Vitals:   01/21/17 0305 01/21/17 0311 01/21/17 0313 01/21/17 0437  BP:  (!) 145/65  (!) 158/63  Pulse:  63  68  Resp:  14  14  Temp:  98 F (36.7 C)    TempSrc:  Oral    SpO2: 99% 100%  100%  Weight:   61.2 kg  (135 lb)   Height:   '5\' 2"'  (1.575 m)       Final Clinical Impressions(s) / ED Diagnoses   Final diagnoses:  Yeast vaginitis  Hyperglycemia    ED Discharge Orders    None       Layla Maw 68/34/19 [id number]    Delora Fuel, MD 97/98/92 [phone]

## 2017-02-28 ENCOUNTER — Ambulatory Visit: Payer: Self-pay | Admitting: Family Medicine

## 2017-03-10 ENCOUNTER — Ambulatory Visit: Payer: Medicaid Other | Attending: Family Medicine | Admitting: Family Medicine

## 2017-03-10 ENCOUNTER — Ambulatory Visit: Payer: Medicaid Other | Admitting: Licensed Clinical Social Worker

## 2017-03-10 ENCOUNTER — Encounter: Payer: Self-pay | Admitting: Family Medicine

## 2017-03-10 VITALS — BP 180/76 | HR 71 | Temp 98.5°F | Ht 62.0 in | Wt 139.0 lb

## 2017-03-10 DIAGNOSIS — F251 Schizoaffective disorder, depressive type: Secondary | ICD-10-CM | POA: Diagnosis not present

## 2017-03-10 DIAGNOSIS — Z794 Long term (current) use of insulin: Secondary | ICD-10-CM | POA: Diagnosis not present

## 2017-03-10 DIAGNOSIS — F319 Bipolar disorder, unspecified: Secondary | ICD-10-CM | POA: Insufficient documentation

## 2017-03-10 DIAGNOSIS — Z9114 Patient's other noncompliance with medication regimen: Secondary | ICD-10-CM | POA: Insufficient documentation

## 2017-03-10 DIAGNOSIS — I10 Essential (primary) hypertension: Secondary | ICD-10-CM | POA: Insufficient documentation

## 2017-03-10 DIAGNOSIS — Z79899 Other long term (current) drug therapy: Secondary | ICD-10-CM | POA: Insufficient documentation

## 2017-03-10 DIAGNOSIS — E119 Type 2 diabetes mellitus without complications: Secondary | ICD-10-CM | POA: Diagnosis present

## 2017-03-10 DIAGNOSIS — E1149 Type 2 diabetes mellitus with other diabetic neurological complication: Secondary | ICD-10-CM

## 2017-03-10 DIAGNOSIS — F419 Anxiety disorder, unspecified: Secondary | ICD-10-CM | POA: Insufficient documentation

## 2017-03-10 DIAGNOSIS — E1165 Type 2 diabetes mellitus with hyperglycemia: Secondary | ICD-10-CM | POA: Insufficient documentation

## 2017-03-10 DIAGNOSIS — E1140 Type 2 diabetes mellitus with diabetic neuropathy, unspecified: Secondary | ICD-10-CM | POA: Insufficient documentation

## 2017-03-10 DIAGNOSIS — B373 Candidiasis of vulva and vagina: Secondary | ICD-10-CM | POA: Insufficient documentation

## 2017-03-10 DIAGNOSIS — N951 Menopausal and female climacteric states: Secondary | ICD-10-CM | POA: Diagnosis not present

## 2017-03-10 DIAGNOSIS — E7800 Pure hypercholesterolemia, unspecified: Secondary | ICD-10-CM | POA: Diagnosis not present

## 2017-03-10 DIAGNOSIS — Z780 Asymptomatic menopausal state: Secondary | ICD-10-CM | POA: Diagnosis not present

## 2017-03-10 DIAGNOSIS — R5383 Other fatigue: Secondary | ICD-10-CM | POA: Insufficient documentation

## 2017-03-10 DIAGNOSIS — B3731 Acute candidiasis of vulva and vagina: Secondary | ICD-10-CM

## 2017-03-10 LAB — POCT URINALYSIS DIPSTICK
BILIRUBIN UA: NEGATIVE
GLUCOSE UA: 500
Ketones, UA: NEGATIVE
Leukocytes, UA: NEGATIVE
Nitrite, UA: NEGATIVE
Protein, UA: NEGATIVE
Spec Grav, UA: 1.01 (ref 1.010–1.025)
Urobilinogen, UA: 0.2 E.U./dL
pH, UA: 6.5 (ref 5.0–8.0)

## 2017-03-10 LAB — GLUCOSE, POCT (MANUAL RESULT ENTRY)
POC GLUCOSE: 571 mg/dL — AB (ref 70–99)
POC Glucose: >600 mg/dl (ref 70–99)

## 2017-03-10 LAB — POCT GLYCOSYLATED HEMOGLOBIN (HGB A1C): HEMOGLOBIN A1C: 13

## 2017-03-10 MED ORDER — GABAPENTIN 300 MG PO CAPS: 300.0000 mg | ORAL_CAPSULE | Freq: Three times a day (TID) | ORAL | 3 refills | Status: DC

## 2017-03-10 MED ORDER — CLONIDINE HCL 0.1 MG PO TABS
0.1000 mg | ORAL_TABLET | Freq: Once | ORAL | Status: AC
  Administered 2017-03-10: 0.1 mg via ORAL

## 2017-03-10 MED ORDER — METFORMIN HCL 1000 MG PO TABS: 1000.0000 mg | ORAL_TABLET | Freq: Two times a day (BID) | ORAL | 3 refills | Status: DC

## 2017-03-10 MED ORDER — LISINOPRIL 20 MG PO TABS: 20.0000 mg | ORAL_TABLET | Freq: Every day | ORAL | 3 refills | Status: DC

## 2017-03-10 MED ORDER — GLIPIZIDE 10 MG PO TABS: 10.0000 mg | ORAL_TABLET | Freq: Two times a day (BID) | ORAL | 3 refills | Status: DC

## 2017-03-10 MED ORDER — FLUCONAZOLE 150 MG PO TABS: 150.0000 mg | ORAL_TABLET | Freq: Once | ORAL | 0 refills | Status: AC

## 2017-03-10 MED ORDER — INSULIN GLARGINE 100 UNIT/ML ~~LOC~~ SOLN: 15.0000 [IU] | Freq: Every day | SUBCUTANEOUS | 3 refills | Status: DC

## 2017-03-10 MED ORDER — INSULIN ASPART 100 UNIT/ML ~~LOC~~ SOLN
12.0000 [IU] | Freq: Once | SUBCUTANEOUS | Status: AC
  Administered 2017-03-10: 12 [IU] via SUBCUTANEOUS

## 2017-03-10 MED ORDER — SIMVASTATIN 20 MG PO TABS: 20.0000 mg | ORAL_TABLET | Freq: Every day | ORAL | 3 refills | Status: DC

## 2017-03-10 MED ORDER — VENLAFAXINE HCL ER 75 MG PO CP24: 75.0000 mg | ORAL_CAPSULE | Freq: Every day | ORAL | 3 refills | Status: DC

## 2017-03-10 MED FILL — glipiZIDE 10 MG TABS: 10 | 30 days supply | Qty: 60 | Fill #0

## 2017-03-10 MED FILL — SIMVASTATIN 20 MG TABLET: 20 | 30 days supply | Qty: 30 | Fill #0

## 2017-03-10 MED FILL — VENLAFAXINE HCL ER 75 MG CA: 75 | 30 days supply | Qty: 30 | Fill #0

## 2017-03-10 MED FILL — metFORMIN HCL 1000 MG TABS: 1000 | 30 days supply | Qty: 60 | Fill #0

## 2017-03-10 MED FILL — FLUCONAZOLE 150 MG TABLET: 150 | 1 days supply | Qty: 1 | Fill #0

## 2017-03-10 MED FILL — LISINOPRIL 20 MG TAB: 20 | 30 days supply | Qty: 30 | Fill #0

## 2017-03-10 MED FILL — GABAPENTIN 300 MG CAPSULE: 300 | 30 days supply | Qty: 90 | Fill #0

## 2017-03-10 MED FILL — LANTUS 100 UNITS/ML VIAL: 100 | 28 days supply | Qty: 10 | Fill #0

## 2017-03-10 NOTE — Patient Instructions (Signed)
Diabetes Mellitus and Nutrition When you have diabetes (diabetes mellitus), it is very important to have healthy eating habits because your blood sugar (glucose) levels are greatly affected by what you eat and drink. Eating healthy foods in the appropriate amounts, at about the same times every day, can help you:  Control your blood glucose.  Lower your risk of heart disease.  Improve your blood pressure.  Reach or maintain a healthy weight.  Every person with diabetes is different, and each person has different needs for a meal plan. Your health care provider may recommend that you work with a diet and nutrition specialist (dietitian) to make a meal plan that is best for you. Your meal plan may vary depending on factors such as:  The calories you need.  The medicines you take.  Your weight.  Your blood glucose, blood pressure, and cholesterol levels.  Your activity level.  Other health conditions you have, such as heart or kidney disease.  How do carbohydrates affect me? Carbohydrates affect your blood glucose level more than any other type of food. Eating carbohydrates naturally increases the amount of glucose in your blood. Carbohydrate counting is a method for keeping track of how many carbohydrates you eat. Counting carbohydrates is important to keep your blood glucose at a healthy level, especially if you use insulin or take certain oral diabetes medicines. It is important to know how many carbohydrates you can safely have in each meal. This is different for every person. Your dietitian can help you calculate how many carbohydrates you should have at each meal and for snack. Foods that contain carbohydrates include:  Bread, cereal, rice, pasta, and crackers.  Potatoes and corn.  Peas, beans, and lentils.  Milk and yogurt.  Fruit and juice.  Desserts, such as cakes, cookies, ice cream, and candy.  How does alcohol affect me? Alcohol can cause a sudden decrease in blood  glucose (hypoglycemia), especially if you use insulin or take certain oral diabetes medicines. Hypoglycemia can be a life-threatening condition. Symptoms of hypoglycemia (sleepiness, dizziness, and confusion) are similar to symptoms of having too much alcohol. If your health care provider says that alcohol is safe for you, follow these guidelines:  Limit alcohol intake to no more than 1 drink per day for nonpregnant women and 2 drinks per day for men. One drink equals 12 oz of beer, 5 oz of wine, or 1 oz of hard liquor.  Do not drink on an empty stomach.  Keep yourself hydrated with water, diet soda, or unsweetened iced tea.  Keep in mind that regular soda, juice, and other mixers may contain a lot of sugar and must be counted as carbohydrates.  What are tips for following this plan? Reading food labels  Start by checking the serving size on the label. The amount of calories, carbohydrates, fats, and other nutrients listed on the label are based on one serving of the food. Many foods contain more than one serving per package.  Check the total grams (g) of carbohydrates in one serving. You can calculate the number of servings of carbohydrates in one serving by dividing the total carbohydrates by 15. For example, if a food has 30 g of total carbohydrates, it would be equal to 2 servings of carbohydrates.  Check the number of grams (g) of saturated and trans fats in one serving. Choose foods that have low or no amount of these fats.  Check the number of milligrams (mg) of sodium in one serving. Most people   should limit total sodium intake to less than 2,300 mg per day.  Always check the nutrition information of foods labeled as "low-fat" or "nonfat". These foods may be higher in added sugar or refined carbohydrates and should be avoided.  Talk to your dietitian to identify your daily goals for nutrients listed on the label. Shopping  Avoid buying canned, premade, or processed foods. These  foods tend to be high in fat, sodium, and added sugar.  Shop around the outside edge of the grocery store. This includes fresh fruits and vegetables, bulk grains, fresh meats, and fresh dairy. Cooking  Use low-heat cooking methods, such as baking, instead of high-heat cooking methods like deep frying.  Cook using healthy oils, such as olive, canola, or sunflower oil.  Avoid cooking with butter, cream, or high-fat meats. Meal planning  Eat meals and snacks regularly, preferably at the same times every day. Avoid going long periods of time without eating.  Eat foods high in fiber, such as fresh fruits, vegetables, beans, and whole grains. Talk to your dietitian about how many servings of carbohydrates you can eat at each meal.  Eat 4-6 ounces of lean protein each day, such as lean meat, chicken, fish, eggs, or tofu. 1 ounce is equal to 1 ounce of meat, chicken, or fish, 1 egg, or 1/4 cup of tofu.  Eat some foods each day that contain healthy fats, such as avocado, nuts, seeds, and fish. Lifestyle   Check your blood glucose regularly.  Exercise at least 30 minutes 5 or more days each week, or as told by your health care provider.  Take medicines as told by your health care provider.  Do not use any products that contain nicotine or tobacco, such as cigarettes and e-cigarettes. If you need help quitting, ask your health care provider.  Work with a counselor or diabetes educator to identify strategies to manage stress and any emotional and social challenges. What are some questions to ask my health care provider?  Do I need to meet with a diabetes educator?  Do I need to meet with a dietitian?  What number can I call if I have questions?  When are the best times to check my blood glucose? Where to find more information:  American Diabetes Association: diabetes.org/food-and-fitness/food  Academy of Nutrition and Dietetics:  www.eatright.org/resources/health/diseases-and-conditions/diabetes  National Institute of Diabetes and Digestive and Kidney Diseases (NIH): www.niddk.nih.gov/health-information/diabetes/overview/diet-eating-physical-activity Summary  A healthy meal plan will help you control your blood glucose and maintain a healthy lifestyle.  Working with a diet and nutrition specialist (dietitian) can help you make a meal plan that is best for you.  Keep in mind that carbohydrates and alcohol have immediate effects on your blood glucose levels. It is important to count carbohydrates and to use alcohol carefully. This information is not intended to replace advice given to you by your health care provider. Make sure you discuss any questions you have with your health care provider. Document Released: 10/25/2004 Document Revised: 03/04/2016 Document Reviewed: 03/04/2016 Elsevier Interactive Patient Education  2018 Elsevier Inc.  

## 2017-03-11 ENCOUNTER — Telehealth: Payer: Self-pay

## 2017-03-11 ENCOUNTER — Encounter: Payer: Self-pay | Admitting: Family Medicine

## 2017-03-11 LAB — CMP14+EGFR
A/G RATIO: 1.2 (ref 1.2–2.2)
ALT: 47 IU/L — AB (ref 0–32)
AST: 26 IU/L (ref 0–40)
Albumin: 4.3 g/dL (ref 3.5–5.5)
Alkaline Phosphatase: 148 IU/L — ABNORMAL HIGH (ref 39–117)
BILIRUBIN TOTAL: 0.7 mg/dL (ref 0.0–1.2)
BUN/Creatinine Ratio: 10 (ref 9–23)
BUN: 9 mg/dL (ref 6–24)
CALCIUM: 10 mg/dL (ref 8.7–10.2)
CO2: 23 mmol/L (ref 20–29)
Chloride: 97 mmol/L (ref 96–106)
Creatinine, Ser: 0.86 mg/dL (ref 0.57–1.00)
GFR, EST AFRICAN AMERICAN: 87 mL/min/{1.73_m2} (ref >59)
GFR, EST NON AFRICAN AMERICAN: 76 mL/min/{1.73_m2} (ref >59)
GLOBULIN, TOTAL: 3.5 g/dL (ref 1.5–4.5)
Glucose: 393 mg/dL — ABNORMAL HIGH (ref 65–99)
POTASSIUM: 3.9 mmol/L (ref 3.5–5.2)
SODIUM: 138 mmol/L (ref 134–144)
Total Protein: 7.8 g/dL (ref 6.0–8.5)

## 2017-03-11 LAB — CBC WITH DIFFERENTIAL/PLATELET
BASOS: 0 %
Basophils Absolute: 0 10*3/uL (ref 0.0–0.2)
EOS (ABSOLUTE): 0.1 10*3/uL (ref 0.0–0.4)
Eos: 2 %
Hematocrit: 39 % (ref 34.0–46.6)
Hemoglobin: 13.2 g/dL (ref 11.1–15.9)
Immature Grans (Abs): 0 10*3/uL (ref 0.0–0.1)
Immature Granulocytes: 0 %
LYMPHS ABS: 1.8 10*3/uL (ref 0.7–3.1)
Lymphs: 33 %
MCH: 29.8 pg (ref 26.6–33.0)
MCHC: 33.8 g/dL (ref 31.5–35.7)
MCV: 88 fL (ref 79–97)
MONOS ABS: 0.5 10*3/uL (ref 0.1–0.9)
Monocytes: 10 %
NEUTROS ABS: 2.9 10*3/uL (ref 1.4–7.0)
Neutrophils: 55 %
PLATELETS: 300 10*3/uL (ref 150–379)
RBC: 4.43 x10E6/uL (ref 3.77–5.28)
RDW: 13.7 % (ref 12.3–15.4)
WBC: 5.3 10*3/uL (ref 3.4–10.8)

## 2017-03-11 LAB — VITAMIN D 25 HYDROXY (VIT D DEFICIENCY, FRACTURES): VIT D 25 HYDROXY: 17 ng/mL — AB (ref 30.0–100.0)

## 2017-03-11 LAB — VITAMIN B12: Vitamin B-12: 570 pg/mL (ref 232–1245)

## 2017-03-11 MED ORDER — ERGOCALCIFEROL 1.25 MG (50000 UT) PO CAPS: 50000.0000 [IU] | ORAL_CAPSULE | ORAL | 1 refills | Status: DC

## 2017-03-11 NOTE — Telephone Encounter (Signed)
Patient was called and there is no voicemail set up to leave a message. 

## 2017-03-11 NOTE — BH Specialist Note (Signed)
Integrated Behavioral Health Initial Visit  MRN: [id number] Name: Lorraine Turner   Session Start time: 4:00 PM Session End time: 4:45 PM Total time: 45 minutes  Type of Service: [place] Interpretor:No. Interpretor Name and Language: N/A   Warm Hand Off Completed.       SUBJECTIVE: Lorraine Turner is a 57 y.o. female accompanied by patient. Patient was referred by Dr. Jarold Song for depression and anxiety. Patient reports the following symptoms/concerns: overwhelming feelings of sadness and worry, low energy, difficulty sleeping, decreased concentration, and irritability Duration of problem: Ongoing Pt has been diagnosed with Schizoaffective Disorder with Depression; Severity of problem: severe  OBJECTIVE: Mood: Depressed and Affect: Depressed and Tearful Risk of harm to self or others: No plan to harm self or others   LIFE CONTEXT: Family and Social: Pt resides with her minor children and grandchild. She receives limited support School/Work: Pt reports financial strain since the passing of her spouse. She does have income Self-Care: Pt does her nails. She has decreased interest in art and crocheting since the loss of her spouse. No report of substance use Life Changes: Pt has difficulty managing ongoing medical conditions. She is experiencing ongoing stress and financial strain from caring for children and grandchildren.  GOALS ADDRESSED: Patient will reduce symptoms of: anxiety, depression and stress and increase knowledge and/or ability of: coping skills and healthy habits and also: Increase adequate support systems for patient/family, Increase motivation to adhere to plan of care and Improve medication compliance   INTERVENTIONS: Behavioral Activation and Supportive Counseling  Standardized Assessments completed: Patient declined screening  ASSESSMENT: Pt currently experiencing depression and anxiety triggered by difficulty managing  ongoing medical conditions. She is experiencing ongoing stress and financial strain from caring for children and grandchildren. Pt reports overwhelming feelings of sadness and worry, low energy, difficulty sleeping, decreased concentration, and irritability.   Pt may benefit from psychotherapy. Pt participates in grief counseling. [place] educated pt on the importance of medication compliance. Pt has good insight of how "losing hope" resulted in her difficulty managing mental and physical health. Pt shared that she is feeling better; however, the ongoing stress from family is a trigger. LCSWA discussed with pt strategies to create healthy boundaries. Pt agreed to apply healthy coping skills to improve health (establish a exercise plan and minimize unhealthy eating habits) She was provided resources to assist with obtaining independent housing. Pt is aware of crisis intervention and psychotherapy resources in the community.   PLAN: 1. Follow up with behavioral health clinician on : Pt was encouraged tocontact LCSWA if symptoms worsen or fail to improveto schedule behavioral appointments at [facility]. 2. Behavioral recommendations: LCSWA recommends that pt apply healthy coping skills discussed, initiate medication management and psychotherapy through [facility] the [place], and utilize provided resources. Pt is encouraged to schedule follow up appointment with LCSWA 3. Referral(s): [employer] logistics/support/administrative officer (LME/Outside Clinic) and [employer]:  Housing 4. "From scale of 1-10, how likely are you to follow plan?": 10/10  Rebekah [place]field, LCSW 03/11/17 2:51 PM

## 2017-03-11 NOTE — Progress Notes (Signed)
Subjective:  Patient ID: Lorraine Turner, female    DOB: 12/25/1960  Age: 57 y.o. MRN: [id number]  CC: Diabetes   HPI Lorraine Turner  is a 57 year old female with a history of schizoaffective affective disorder, hypertension, diabetic neuropathy, type 2 diabetes mellitus (A1c 13.0  ) who presents today for follow-up visit. She was last seen in the clinic 5 months ago and informs me she took a trip to [place] to stay with her daughter and during that time was not under the care of any physician and as a result run out of all her medications.  Her blood sugar in the clinic is greater than 600 and she ate some Ritz crackers a few minutes ago.  Her blood pressure is significantly elevated at 183/67.  25 units and clonidine 0.1 mg administered. She complains of several family stressors and having to care for her granddaughter which has left her with little income to afford her basic needs including her medications; her son is involved with gangs and this has her worried all the time. She also continues to feel sad about the loss of her husband and is attending grief counseling. She denies suicidal ideation or intents. At this time she is currently not on any psych medications and has not followed up with [facility] where she previously received mental care.  She complains of fatigue and hot flashes which she states are not bothersome but I improved when she ingests ice.  She also complains of a vaginal discharge which she states is inform of a yeast infection and I would like a prescription for that.  Past Medical History:  Diagnosis Date  . Anxiety   . Bipolar 1 disorder ([place])   . Depression   . Diabetes mellitus without complication ([place])   . Gallstones   . Hyperlipidemia   . Hypertension   . Neuropathy   . Schizophrenia ([place])   . Seizures ([place])    X1- febrile seizure as a child-none since    Past Surgical History:  Procedure Laterality Date  . CESAREAN SECTION  05/1998   X 1  .  CHOLECYSTECTOMY N/A 03/27/2016   Procedure: LAPAROSCOPIC CHOLECYSTECTOMY;  Surgeon: Olean Ree, MD;  Location: [facility] ORS;  Service: General;  Laterality: N/A;  . TONSILLECTOMY AND ADENOIDECTOMY     age 22  . TUBAL LIGATION      No Known Allergies   Outpatient Medications Prior to Visit  Medication Sig Dispense Refill  . Blood Glucose Monitoring Suppl (TRUE METRIX METER) w/Device KIT 1 Device by Does not apply route 3 (three) times daily. (Patient not taking: Reported on 09/23/2016) 1 kit 0  . busPIRone (BUSPAR) 7.5 MG tablet Take 1 tablet (7.5 mg total) by mouth 2 (two) times daily. Reported on 05/31/2015 (Patient not taking: Reported on 09/23/2016) 60 tablet 0  . Insulin Syringe-Needle U-100 31G X 5/16" 0.3 ML MISC 1 each by Does not apply route 4 (four) times daily -  before meals and at bedtime. (Patient not taking: Reported on 09/23/2016) 100 each 12  . Multiple Vitamin (MULTIVITAMIN) tablet Take 1 tablet by mouth daily. Reported on 08/30/2015 (Patient not taking: Reported on 09/23/2016) 30 tablet 3  . QUEtiapine (SEROQUEL) 50 MG tablet Take 3 tablets (150 mg total) by mouth at bedtime. For mood control/depression (Patient not taking: Reported on 09/23/2016) 90 tablet 0  . TRUEPLUS LANCETS 28G MISC 1 each by Does not apply route 3 (three) times daily. (Patient not taking: Reported on 09/23/2016) 100  each 12  . gabapentin (NEURONTIN) 300 MG capsule Take 1 capsule (300 mg total) by mouth 3 (three) times daily. (Patient not taking: Reported on 03/10/2017) 90 capsule 3  . glipiZIDE (GLUCOTROL) 10 MG tablet Take 1 tablet (10 mg total) by mouth 2 (two) times daily before a meal. (Patient not taking: Reported on 03/10/2017) 60 tablet 3  . insulin glargine (LANTUS) 100 UNIT/ML injection Inject 0.15 mLs (15 Units total) into the skin at bedtime. (Patient not taking: Reported on 03/10/2017) 10 mL 3  . lisinopril (PRINIVIL,ZESTRIL) 20 MG tablet Take 1 tablet (20 mg total) by mouth daily. (Patient not taking:  Reported on 03/10/2017) 30 tablet 3  . metFORMIN (GLUCOPHAGE) 1000 MG tablet Take 1 tablet (1,000 mg total) by mouth 2 (two) times daily. (Patient not taking: Reported on 03/10/2017) 60 tablet 3  . sertraline (ZOLOFT) 100 MG tablet Take 1 tablet (100 mg total) by mouth daily. For depression (Patient not taking: Reported on 09/23/2016) 30 tablet 0  . simvastatin (ZOCOR) 20 MG tablet Take 1 tablet (20 mg total) by mouth daily at 6 PM. (Patient not taking: Reported on 10/07/2016) 30 tablet 3  . traZODone (DESYREL) 50 MG tablet TAKE 1 TABLET BY MOUTH AT BEDTIME AS NEEDED FOR SLEEP. (Patient not taking: Reported on 09/23/2016) 30 tablet 0   No facility-administered medications prior to visit.     ROS Review of Systems  Constitutional: Positive for fatigue. Negative for activity change and appetite change.  HENT: Negative for congestion, sinus pressure and sore throat.   Eyes: Negative for visual disturbance.  Respiratory: Negative for cough, chest tightness, shortness of breath and wheezing.   Cardiovascular: Negative for chest pain and palpitations.  Gastrointestinal: Negative for abdominal distention, abdominal pain and constipation.  Endocrine: Negative for polydipsia.  Genitourinary: Positive for vaginal discharge. Negative for dysuria and frequency.  Musculoskeletal: Negative for arthralgias and back pain.  Skin: Negative for rash.  Neurological: Negative for tremors, light-headedness and numbness.  Hematological: Does not bruise/bleed easily.  Psychiatric/Behavioral: Positive for dysphoric mood. Negative for agitation and behavioral problems.    Objective:  BP (!) 180/76   Pulse 71   Temp 98.5 F (36.9 C) (Oral)   Ht '5\' 2"'  (1.575 m)   Wt 139 lb (63 kg)   SpO2 100%   BMI 25.42 kg/m   BP/Weight 03/10/2017 01/21/2017 7/93/9030  Systolic BP 092 330 076  Diastolic BP 76 63 76  Wt. (Lbs) 139 135 127  BMI 25.42 24.69 23.23  Some encounter information is confidential and restricted. Go  to Review Flowsheets activity to see all data.      Physical Exam  Constitutional: She is oriented to person, place, and time. She appears well-developed and well-nourished.  Cardiovascular: Normal rate, normal heart sounds and intact distal pulses.  No murmur heard. Pulmonary/Chest: Effort normal and breath sounds normal. She has no wheezes. She has no rales. She exhibits no tenderness.  Abdominal: Soft. Bowel sounds are normal. She exhibits no distension and no mass. There is no tenderness.  Musculoskeletal: Normal range of motion.  Neurological: She is alert and oriented to person, place, and time.  Skin: Skin is warm and dry.  Psychiatric: She has a normal mood and affect.    Lab Results  Component Value Date   HGBA1C 13.0 03/10/2017    Assessment & Plan:   1. Type 2 diabetes mellitus with hyperglycemia, with long-term current use of insulin (HCC) Uncontrolled with A1c 13 due to noncompliance Blood sugar of  greater than 600; NovoLog 25 units administered and repeat blood sugar performed Refilled all her medications and she promises to do better Counseled on Diabetic diet, my plate method, 456 minutes of moderate intensity exercise/week Keep blood sugar logs with fasting goals of 80-120 mg/dl, random of less than 180 and in the event of sugars less than 60 mg/dl or greater than 400 mg/dl please notify the clinic ASAP. It is recommended that you undergo annual eye exams and annual foot exams. Pneumonia vaccine is recommended. - POCT glucose (manual entry) - POCT glycosylated hemoglobin (Hb A1C) - POCT urinalysis dipstick - glipiZIDE (GLUCOTROL) 10 MG tablet; Take 1 tablet (10 mg total) by mouth 2 (two) times daily before a meal.  Dispense: 60 tablet; Refill: 3 - insulin glargine (LANTUS) 100 UNIT/ML injection; Inject 0.15 mLs (15 Units total) into the skin at bedtime.  Dispense: 10 mL; Refill: 3 - metFORMIN (GLUCOPHAGE) 1000 MG tablet; Take 1 tablet (1,000 mg total) by mouth 2  (two) times daily.  Dispense: 60 tablet; Refill: 3 - CMP14+EGFR - insulin aspart (novoLOG) injection 12 Units - POCT glucose (manual entry)  2. Other diabetic neurological complication associated with type 2 diabetes mellitus (HCC) Controlled - gabapentin (NEURONTIN) 300 MG capsule; Take 1 capsule (300 mg total) by mouth 3 (three) times daily.  Dispense: 90 capsule; Refill: 3  3. Essential hypertension Uncontrolled due to running out of medications Clonidine 0.1 mg administered and repeat blood pressure performed after - cloNIDine (CATAPRES) tablet 0.1 mg - lisinopril (PRINIVIL,ZESTRIL) 20 MG tablet; Take 1 tablet (20 mg total) by mouth daily.  Dispense: 30 tablet; Refill: 3  4. HYPERCHOLESTEROLEMIA Likely to be uncontrolled due to noncompliance Low-cholesterol diet - simvastatin (ZOCOR) 20 MG tablet; Take 1 tablet (20 mg total) by mouth daily at 6 PM.  Dispense: 30 tablet; Refill: 3  5. Other fatigue Fatigue could also accompany menopause symptoms Placed on Effexor Will evaluate for anemia on vitamin D deficiency - venlafaxine XR (EFFEXOR XR) 75 MG 24 hr capsule; Take 1 capsule (75 mg total) by mouth daily with breakfast.  Dispense: 60 capsule; Refill: 3 - Vitamin B12 - VITAMIN D 25 Hydroxy (Vit-D Deficiency, Fractures) - CBC with Differential/Platelet  6. Menopause States this is not bothersome We will consider placing her on clonidine at her next visit if it persist  7. Schizoaffective disorder, depressive type ([place]) LCSW called in to see the patient She was previously followed by mental health at [facility] and has been advised to schedule an appointment  8. Vaginal candidiasis - fluconazole (DIFLUCAN) 150 MG tablet; Take 1 tablet (150 mg total) by mouth once for 1 dose.  Dispense: 1 tablet; Refill: 0   Meds ordered this encounter  Medications  . gabapentin (NEURONTIN) 300 MG capsule    Sig: Take 1 capsule (300 mg total) by mouth 3 (three) times daily.    Dispense:  90  capsule    Refill:  3  . glipiZIDE (GLUCOTROL) 10 MG tablet    Sig: Take 1 tablet (10 mg total) by mouth 2 (two) times daily before a meal.    Dispense:  60 tablet    Refill:  3  . insulin glargine (LANTUS) 100 UNIT/ML injection    Sig: Inject 0.15 mLs (15 Units total) into the skin at bedtime.    Dispense:  10 mL    Refill:  3  . DISCONTD: lisinopril (PRINIVIL,ZESTRIL) 20 MG tablet    Sig: Take 1 tablet (20 mg total) by mouth daily.  Dispense:  30 tablet    Refill:  3  . metFORMIN (GLUCOPHAGE) 1000 MG tablet    Sig: Take 1 tablet (1,000 mg total) by mouth 2 (two) times daily.    Dispense:  60 tablet    Refill:  3  . simvastatin (ZOCOR) 20 MG tablet    Sig: Take 1 tablet (20 mg total) by mouth daily at 6 PM.    Dispense:  30 tablet    Refill:  3  . venlafaxine XR (EFFEXOR XR) 75 MG 24 hr capsule    Sig: Take 1 capsule (75 mg total) by mouth daily with breakfast.    Dispense:  60 capsule    Refill:  3  . fluconazole (DIFLUCAN) 150 MG tablet    Sig: Take 1 tablet (150 mg total) by mouth once for 1 dose.    Dispense:  1 tablet    Refill:  0  . cloNIDine (CATAPRES) tablet 0.1 mg  . insulin aspart (novoLOG) injection 12 Units  . DISCONTD: lisinopril (PRINIVIL,ZESTRIL) 20 MG tablet    Sig: Take 1 tablet (20 mg total) by mouth daily.    Dispense:  30 tablet    Refill:  3  . lisinopril (PRINIVIL,ZESTRIL) 20 MG tablet    Sig: Take 1 tablet (20 mg total) by mouth daily.    Dispense:  30 tablet    Refill:  3    Follow-up: Return in about 1 month (around 04/10/2017) for complete physical exam.   [name] MD

## 2017-03-14 ENCOUNTER — Telehealth: Payer: Self-pay

## 2017-03-14 NOTE — Telephone Encounter (Signed)
Patient was called and there is no voicemail set up to leave a message.    If patient returns phone call please inform Vitamin D level is low and I have sent a prescription for replacement to the pharmacy. This could explain her fatigue.

## 2017-07-08 ENCOUNTER — Encounter (HOSPITAL_COMMUNITY): Payer: Self-pay | Admitting: Family Medicine

## 2017-07-08 ENCOUNTER — Emergency Department (HOSPITAL_COMMUNITY)
Admission: EM | Admit: 2017-07-08 | Discharge: 2017-07-09 | Disposition: A | Discharge status: Home / Self Care | Payer: Medicaid Other | Attending: Emergency Medicine | Admitting: Emergency Medicine

## 2017-07-08 ENCOUNTER — Other Ambulatory Visit: Payer: Self-pay

## 2017-07-08 DIAGNOSIS — E1165 Type 2 diabetes mellitus with hyperglycemia: Secondary | ICD-10-CM | POA: Diagnosis not present

## 2017-07-08 DIAGNOSIS — I10 Essential (primary) hypertension: Secondary | ICD-10-CM | POA: Insufficient documentation

## 2017-07-08 DIAGNOSIS — Z794 Long term (current) use of insulin: Secondary | ICD-10-CM | POA: Diagnosis not present

## 2017-07-08 DIAGNOSIS — Z046 Encounter for general psychiatric examination, requested by authority: Secondary | ICD-10-CM | POA: Diagnosis present

## 2017-07-08 DIAGNOSIS — E1149 Type 2 diabetes mellitus with other diabetic neurological complication: Secondary | ICD-10-CM

## 2017-07-08 DIAGNOSIS — R739 Hyperglycemia, unspecified: Secondary | ICD-10-CM

## 2017-07-08 DIAGNOSIS — Z79899 Other long term (current) drug therapy: Secondary | ICD-10-CM | POA: Insufficient documentation

## 2017-07-08 DIAGNOSIS — F418 Other specified anxiety disorders: Secondary | ICD-10-CM | POA: Diagnosis not present

## 2017-07-08 DIAGNOSIS — F419 Anxiety disorder, unspecified: Secondary | ICD-10-CM | POA: Diagnosis not present

## 2017-07-08 DIAGNOSIS — Z87891 Personal history of nicotine dependence: Secondary | ICD-10-CM | POA: Diagnosis not present

## 2017-07-08 DIAGNOSIS — F32A Depression, unspecified: Secondary | ICD-10-CM | POA: Diagnosis present

## 2017-07-08 DIAGNOSIS — F251 Schizoaffective disorder, depressive type: Secondary | ICD-10-CM | POA: Insufficient documentation

## 2017-07-08 DIAGNOSIS — R5383 Other fatigue: Secondary | ICD-10-CM

## 2017-07-08 LAB — CBG MONITORING, ED
GLUCOSE-CAPILLARY: 407 mg/dL — AB (ref 65–99)
Glucose-Capillary: 360 mg/dL — ABNORMAL HIGH (ref 65–99)

## 2017-07-08 LAB — ACETAMINOPHEN LEVEL

## 2017-07-08 LAB — CBC
HEMATOCRIT: 38.6 % (ref 36.0–46.0)
HEMOGLOBIN: 13.5 g/dL (ref 12.0–15.0)
MCH: 30.8 pg (ref 26.0–34.0)
MCHC: 35 g/dL (ref 30.0–36.0)
MCV: 87.9 fL (ref 78.0–100.0)
Platelets: 278 10*3/uL (ref 150–400)
RBC: 4.39 MIL/uL (ref 3.87–5.11)
RDW: 12.8 % (ref 11.5–15.5)
WBC: 5.4 10*3/uL (ref 4.0–10.5)

## 2017-07-08 LAB — RAPID URINE DRUG SCREEN, HOSP PERFORMED
AMPHETAMINES: NOT DETECTED
BARBITURATES: NOT DETECTED
BENZODIAZEPINES: NOT DETECTED
Cocaine: NOT DETECTED
Opiates: NOT DETECTED
TETRAHYDROCANNABINOL: NOT DETECTED

## 2017-07-08 LAB — COMPREHENSIVE METABOLIC PANEL
ALBUMIN: 4.3 g/dL (ref 3.5–5.0)
ALT: 28 U/L (ref 14–54)
ANION GAP: 12 (ref 5–15)
AST: 19 U/L (ref 15–41)
Alkaline Phosphatase: 104 U/L (ref 38–126)
BILIRUBIN TOTAL: 1.5 mg/dL — AB (ref 0.3–1.2)
BUN: 16 mg/dL (ref 6–20)
CO2: 24 mmol/L (ref 22–32)
Calcium: 9.4 mg/dL (ref 8.9–10.3)
Chloride: 100 mmol/L — ABNORMAL LOW (ref 101–111)
Creatinine, Ser: 1.19 mg/dL — ABNORMAL HIGH (ref 0.44–1.00)
GFR calc Af Amer: 58 mL/min — ABNORMAL LOW (ref >60)
GFR calc non Af Amer: 50 mL/min — ABNORMAL LOW (ref >60)
GLUCOSE: 458 mg/dL — AB (ref 65–99)
Potassium: 3.7 mmol/L (ref 3.5–5.1)
SODIUM: 136 mmol/L (ref 135–145)
TOTAL PROTEIN: 8.1 g/dL (ref 6.5–8.1)

## 2017-07-08 LAB — URINALYSIS, ROUTINE W REFLEX MICROSCOPIC
BILIRUBIN URINE: NEGATIVE
Glucose, UA: >=500 mg/dL — AB
Ketones, ur: 5 mg/dL — AB
Nitrite: NEGATIVE
Protein, ur: 30 mg/dL — AB
SPECIFIC GRAVITY, URINE: 1.026 (ref 1.005–1.030)
pH: 5 (ref 5.0–8.0)

## 2017-07-08 LAB — ETHANOL: Alcohol, Ethyl (B): <10 mg/dL (ref <10)

## 2017-07-08 LAB — SALICYLATE LEVEL: Salicylate Lvl: <7 mg/dL (ref 2.8–30.0)

## 2017-07-08 MED ORDER — SIMVASTATIN 20 MG PO TABS: 20.0000 mg | ORAL_TABLET | Freq: Every day | ORAL | Status: DC

## 2017-07-08 MED ORDER — BUSPIRONE HCL 5 MG PO TABS
7.5000 mg | ORAL_TABLET | Freq: Two times a day (BID) | ORAL | Status: DC
Start: 2017-07-08 — End: 2017-07-09
  Administered 2017-07-08 – 2017-07-09 (×2): 7.5 mg via ORAL
  Filled 2017-07-08 (×2): qty 1.5

## 2017-07-08 MED ORDER — VENLAFAXINE HCL ER 75 MG PO CP24
75.0000 mg | ORAL_CAPSULE | Freq: Every day | ORAL | Status: DC
  Administered 2017-07-09: 75 mg via ORAL
  Filled 2017-07-08: qty 1

## 2017-07-08 MED ORDER — GABAPENTIN 300 MG PO CAPS
300.0000 mg | ORAL_CAPSULE | Freq: Three times a day (TID) | ORAL | Status: DC
  Administered 2017-07-09 (×2): 300 mg via ORAL
  Filled 2017-07-08 (×2): qty 1

## 2017-07-08 MED ORDER — ALUM & MAG HYDROXIDE-SIMETH 200-200-20 MG/5ML PO SUSP: 30.0000 mL | Freq: Four times a day (QID) | ORAL | Status: DC | PRN

## 2017-07-08 MED ORDER — SODIUM CHLORIDE 0.9 % IV BOLUS
1000.0000 mL | Freq: Once | INTRAVENOUS | Status: AC
  Administered 2017-07-08: 1000 mL via INTRAVENOUS

## 2017-07-08 MED ORDER — LISINOPRIL 20 MG PO TABS
20.0000 mg | ORAL_TABLET | Freq: Every day | ORAL | Status: DC
  Filled 2017-07-08: qty 1

## 2017-07-08 MED ORDER — SERTRALINE HCL 50 MG PO TABS
50.0000 mg | ORAL_TABLET | Freq: Every day | ORAL | Status: DC
  Administered 2017-07-09: 50 mg via ORAL
  Filled 2017-07-08: qty 1

## 2017-07-08 MED ORDER — ONDANSETRON HCL 4 MG PO TABS: 4.0000 mg | ORAL_TABLET | Freq: Three times a day (TID) | ORAL | Status: DC | PRN

## 2017-07-08 MED ORDER — NICOTINE 21 MG/24HR TD PT24: 21.0000 mg | MEDICATED_PATCH | Freq: Every day | TRANSDERMAL | Status: DC

## 2017-07-08 MED ORDER — QUETIAPINE FUMARATE 50 MG PO TABS
150.0000 mg | ORAL_TABLET | Freq: Every day | ORAL | Status: DC
  Administered 2017-07-09: 150 mg via ORAL
  Filled 2017-07-08: qty 1

## 2017-07-08 MED ORDER — FLUCONAZOLE 150 MG PO TABS
150.0000 mg | ORAL_TABLET | Freq: Once | ORAL | Status: AC
  Administered 2017-07-08: 150 mg via ORAL
  Filled 2017-07-08: qty 1

## 2017-07-08 MED ORDER — INSULIN ASPART 100 UNIT/ML ~~LOC~~ SOLN: 0.0000 [IU] | Freq: Every day | SUBCUTANEOUS | Status: DC

## 2017-07-08 MED ORDER — INSULIN ASPART 100 UNIT/ML ~~LOC~~ SOLN
0.0000 [IU] | Freq: Three times a day (TID) | SUBCUTANEOUS | Status: DC
  Administered 2017-07-08: 15 [IU] via SUBCUTANEOUS
  Filled 2017-07-08: qty 1

## 2017-07-08 MED ORDER — ACETAMINOPHEN 325 MG PO TABS
650.0000 mg | ORAL_TABLET | ORAL | Status: DC | PRN
Start: 2017-07-08 — End: 2017-07-09
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
## 2017-07-08 NOTE — ED Notes (Signed)
Provided patient a warm blanket
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
## 2017-07-08 NOTE — Progress Notes (Signed)
TTS consulted with Lindon Romp, NP who recommends continued observation for safety and to be reassessed in the AM by psych. EDP Sherwood Gambler, MD and pt's nurse Mortimer Fries, RN have been advised of the disposition.  Lind Covert, MSW, LCSW Therapeutic Triage Specialist  [phone]
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
## 2017-07-08 NOTE — ED Notes (Signed)
Patient ambulated to restroom and back to stretcher with sitter. Patient was unable to obtain a urine sample. Will attempt again after IV fluids. This was performed prior to IV insertion.

## 2017-07-09 DIAGNOSIS — F419 Anxiety disorder, unspecified: Secondary | ICD-10-CM | POA: Diagnosis not present

## 2017-07-09 DIAGNOSIS — Z87891 Personal history of nicotine dependence: Secondary | ICD-10-CM | POA: Diagnosis not present

## 2017-07-09 DIAGNOSIS — F251 Schizoaffective disorder, depressive type: Secondary | ICD-10-CM

## 2017-07-09 LAB — CBG MONITORING, ED
GLUCOSE-CAPILLARY: 253 mg/dL — AB (ref 65–99)
GLUCOSE-CAPILLARY: 86 mg/dL (ref 65–99)

## 2017-07-09 MED ORDER — BUSPIRONE HCL 7.5 MG PO TABS: 7.5000 mg | ORAL_TABLET | Freq: Two times a day (BID) | ORAL | 0 refills | Status: DC

## 2017-07-09 MED ORDER — GABAPENTIN 300 MG PO CAPS: 300.0000 mg | ORAL_CAPSULE | Freq: Three times a day (TID) | ORAL | 3 refills | Status: DC

## 2017-07-09 MED ORDER — QUETIAPINE FUMARATE 50 MG PO TABS: 150.0000 mg | ORAL_TABLET | Freq: Every day | ORAL | 0 refills | Status: DC

## 2017-07-09 MED ORDER — VENLAFAXINE HCL ER 75 MG PO CP24: 75.0000 mg | ORAL_CAPSULE | Freq: Every day | ORAL | 3 refills | Status: DC

## 2017-07-09 NOTE — Consult Note (Addendum)
[facility] Consult   Reason for Consult:  Depressed with hallucinations Referring Physician:  EDP Patient Identification: Lorraine Turner MRN:  [id number] Principal Diagnosis: Schizoaffective disorder, depressive type [facility]) Diagnosis:   Patient Active Problem List   Diagnosis Date Noted  . Schizoaffective disorder, depressive type ([place]) [F25.1] 08/14/2011    Priority: High  . Umbilical hernia with obstruction [K42.0] 11/24/2015  . Symptomatic cholelithiasis [K80.20] 11/24/2015  . Type 2 diabetes mellitus with hyperglycemia (HCC) [E11.65]   . UNSPECIFIED ANEMIA [D64.9] 01/23/2009  . Essential hypertension [I10] 10/12/2008  . HYPERCHOLESTEROLEMIA [E78.00] 01/01/2007  . PANIC DISORDER [F41.0] 01/01/2007  . Obsessive compulsive disorder [F42.9] 01/01/2007    Total Time spent with patient: 45 minutes  Subjective:   Lorraine Turner is a 57 y.o. female patient does not warrant admission.  HPI:  57 yo female who presented with depression and hallucinations.  Seroquel was started and she reports her "mind feels has slowed down, racing thoughts are gone."  She wants to leave and return to the shelter.  Denies suicidal/homicidal ideations, hallucinations, and substance abuse.  Stable for discharge.  Past Psychiatric History: schizoaffective disorder  Risk to Self: Suicidal Ideation: No Suicidal Intent: No Is patient at risk for suicide?: No Suicidal Plan?: No Access to Means: No What has been your use of drugs/alcohol within the last 12 months?: denies use  Triggers for Past Attempts: None known Intentional Self Injurious Behavior: None Risk to Others: Homicidal Ideation: No Thoughts of Harm to Others: No Current Homicidal Intent: No Current Homicidal Plan: No Access to Homicidal Means: No History of harm to others?: No Assessment of Violence: None Noted Does patient have access to weapons?: No Criminal Charges Pending?: Yes Describe Pending Criminal Charges:  assault Does patient have a court date: Yes Court Date: (June 2019) Prior Inpatient Therapy: Prior Inpatient Therapy: Yes Prior Therapy Dates: 2016, 2015 and mult other admissions Prior Therapy Facilty/Provider(s): [facility] Reason for Treatment: SCHIZOAFFECTIVE D/O Prior Outpatient Therapy: Prior Outpatient Therapy: Yes Prior Therapy Dates: 2017 Prior Therapy Facilty/Provider(s): [facility] Reason for Treatment: SCHIZOAFFECTIVE, MED MANAGEMENT  Does patient have an ACCT team?: No Does patient have Intensive In-House Services?  : No Does patient have [facility] services? : No Does patient have P4CC services?: No  Past Medical History:  Past Medical History:  Diagnosis Date  . Anxiety   . Bipolar 1 disorder ([place])   . Depression   . Diabetes mellitus without complication ([place])   . Gallstones   . Hyperlipidemia   . Hypertension   . Neuropathy   . Schizophrenia ([place])   . Seizures ([place])    X1- febrile seizure as a child-none since    Past Surgical History:  Procedure Laterality Date  . CESAREAN SECTION  05/1998   X 1  . CHOLECYSTECTOMY N/A 03/27/2016   Procedure: LAPAROSCOPIC CHOLECYSTECTOMY;  Surgeon: Olean Ree, MD;  Location: [facility] ORS;  Service: General;  Laterality: N/A;  . TONSILLECTOMY AND ADENOIDECTOMY     age 44  . TUBAL LIGATION     Family History:  Family History  Problem Relation Age of Onset  . Diabetes Mother   . Heart disease Mother   . Kidney disease Mother   . Stroke Mother   . Hyperlipidemia Mother   . Diabetes Maternal Aunt    Family Psychiatric  History: none Social History:  Social History   Substance and Sexual Activity  Alcohol Use Yes  . Alcohol/week: 0.6 oz  . Types: 1  Cans of beer per week   Comment: One can of beer every 2-3 months.      Social History   Substance and Sexual Activity  Drug Use No    Social History   Socioeconomic History  . Marital status: Married    Spouse name: Not on file  . Number of  children: Not on file  . Years of education: Not on file  . Highest education level: Not on file  Occupational History  . Not on file  Social Needs  . Financial resource strain: Not on file  . Food insecurity:    Worry: Not on file    Inability: Not on file  . Transportation needs:    Medical: Not on file    Non-medical: Not on file  Tobacco Use  . Smoking status: Former Smoker    Types: Cigarettes    Last attempt to quit: 03/14/2013    Years since quitting: 4.3  . Smokeless tobacco: Never Used  . Tobacco comment: 1 cigarette1-2 times year, only when she gets stressed  Substance and Sexual Activity  . Alcohol use: Yes    Alcohol/week: 0.6 oz    Types: 1 Cans of beer per week    Comment: One can of beer every 2-3 months.   . Drug use: No  . Sexual activity: Not Currently  Lifestyle  . Physical activity:    Days per week: Not on file    Minutes per session: Not on file  . Stress: Not on file  Relationships  . Social connections:    Talks on phone: Not on file    Gets together: Not on file    Attends religious service: Not on file    Active member of club or organization: Not on file    Attends meetings of clubs or organizations: Not on file    Relationship status: Not on file  Other Topics Concern  . Not on file  Social History Narrative  . Not on file   Additional Social History: N/A    Allergies:  No Known Allergies  Labs:  Results for orders placed or performed during the hospital encounter of 07/08/17 (from the past 48 hour(s))  Comprehensive metabolic panel     Status: Abnormal   Collection Time: 07/08/17  8:48 PM  Result Value Ref Range   Sodium 136 135 - 145 mmol/L   Potassium 3.7 3.5 - 5.1 mmol/L   Chloride 100 (L) 101 - 111 mmol/L   CO2 24 22 - 32 mmol/L   Glucose, Bld 458 (H) 65 - 99 mg/dL   BUN 16 6 - 20 mg/dL   Creatinine, Ser 1.19 (H) 0.44 - 1.00 mg/dL   Calcium 9.4 8.9 - 10.3 mg/dL   Total Protein 8.1 6.5 - 8.1 g/dL   Albumin 4.3 3.5 - 5.0  g/dL   AST 19 15 - 41 U/L   ALT 28 14 - 54 U/L   Alkaline Phosphatase 104 38 - 126 U/L   Total Bilirubin 1.5 (H) 0.3 - 1.2 mg/dL   GFR calc non Af Amer 50 (L) >60 mL/min   GFR calc Af Amer 58 (L) >60 mL/min    Comment: (NOTE) The eGFR has been calculated using the CKD EPI equation. This calculation has not been validated in all clinical situations. eGFR's persistently <60 mL/min signify possible Chronic Kidney Disease.    Anion gap 12 5 - 15    Comment: Performed at [facility], [place] [address]  [place], [place] [address]  Ethanol     Status: None   Collection Time: 07/08/17  8:48 PM  Result Value Ref Range   Alcohol, Ethyl (B) <10 <10 mg/dL    Comment: (NOTE) Lowest detectable limit for serum alcohol is 10 mg/dL. For medical purposes only. Performed at [facility], [place] [address], [place], [place] [address]   Salicylate level     Status: None   Collection Time: 07/08/17  8:48 PM  Result Value Ref Range   Salicylate Lvl <6.2 2.8 - 30.0 mg/dL    Comment: Performed at [facility], [place] [address], [place], [place] [address]  Acetaminophen level     Status: Abnormal   Collection Time: 07/08/17  8:48 PM  Result Value Ref Range   Acetaminophen (Tylenol), Serum <10 (L) 10 - 30 ug/mL    Comment: (NOTE) Therapeutic concentrations vary significantly. A range of 10-30 ug/mL  may be an effective concentration for many patients. However, some  are best treated at concentrations outside of this range. Acetaminophen concentrations >150 ug/mL at 4 hours after ingestion  and >50 ug/mL at 12 hours after ingestion are often associated with  toxic reactions. Performed at [facility], [place] [address], [place], [place] [address]   cbc     Status: None   Collection Time: 07/08/17  8:48 PM  Result Value Ref Range   WBC 5.4 4.0 - 10.5 K/uL   RBC 4.39 3.87 - 5.11 MIL/uL   Hemoglobin 13.5 12.0 - 15.0 g/dL   HCT 38.6  36.0 - 46.0 %   MCV 87.9 78.0 - 100.0 fL   MCH 30.8 26.0 - 34.0 pg   MCHC 35.0 30.0 - 36.0 g/dL   RDW 12.8 11.5 - 15.5 %   Platelets 278 150 - 400 K/uL    Comment: Performed at [facility], [place] [address] [place], [place] [address]  Rapid urine drug screen (hospital performed)     Status: None   Collection Time: 07/08/17  9:40 PM  Result Value Ref Range   Opiates NONE DETECTED NONE DETECTED   Cocaine NONE DETECTED NONE DETECTED   Benzodiazepines NONE DETECTED NONE DETECTED   Amphetamines NONE DETECTED NONE DETECTED   Tetrahydrocannabinol NONE DETECTED NONE DETECTED   Barbiturates NONE DETECTED NONE DETECTED    Comment: (NOTE) DRUG SCREEN FOR MEDICAL PURPOSES ONLY.  IF CONFIRMATION IS NEEDED FOR ANY PURPOSE, NOTIFY LAB WITHIN 5 DAYS. LOWEST DETECTABLE LIMITS FOR URINE DRUG SCREEN Drug Class                     Cutoff (ng/mL) Amphetamine and metabolites    1000 Barbiturate and metabolites    200 Benzodiazepine                 468 Tricyclics and metabolites     300 Opiates and metabolites        300 Cocaine and metabolites        300 THC                            50 Performed at [facility], [place] [address], [place], [place] [address]   Urinalysis, Routine w reflex microscopic     Status: Abnormal   Collection Time: 07/08/17  9:40 PM  Result Value Ref Range   Color, Urine YELLOW YELLOW   APPearance HAZY (A) CLEAR   Specific Gravity, Urine  1.026 1.005 - 1.030   pH 5.0 5.0 - 8.0   Glucose, UA >=500 (A) NEGATIVE mg/dL   Hgb urine dipstick SMALL (A) NEGATIVE   Bilirubin Urine NEGATIVE NEGATIVE   Ketones, ur 5 (A) NEGATIVE mg/dL   Protein, ur 30 (A) NEGATIVE mg/dL   Nitrite NEGATIVE NEGATIVE   Leukocytes, UA MODERATE (A) NEGATIVE   RBC / HPF 0-5 0 - 5 RBC/hpf   WBC, UA 6-10 0 - 5 WBC/hpf   Bacteria, UA RARE (A) NONE SEEN   Squamous Epithelial / LPF 6-10 0 - 5   Mucus PRESENT    Hyaline Casts, UA PRESENT     Comment: Performed at  [facility], [place] [address], [place], [place] [address]  CBG monitoring, ED     Status: Abnormal   Collection Time: 07/08/17 10:38 PM  Result Value Ref Range   Glucose-Capillary 407 (H) 65 - 99 mg/dL  CBG monitoring, ED     Status: Abnormal   Collection Time: 07/08/17 11:28 PM  Result Value Ref Range   Glucose-Capillary 360 (H) 65 - 99 mg/dL   Comment 1 Notify RN    Comment 2 Document in Chart   CBG monitoring, ED     Status: Abnormal   Collection Time: 07/09/17  1:22 AM  Result Value Ref Range   Glucose-Capillary 253 (H) 65 - 99 mg/dL   Comment 1 Notify RN    Comment 2 Document in Chart   CBG monitoring, ED     Status: None   Collection Time: 07/09/17  6:32 AM  Result Value Ref Range   Glucose-Capillary 86 65 - 99 mg/dL   Comment 1 Notify RN    Comment 2 Document in Chart     Current Facility-Administered Medications  Medication Dose Route Frequency Provider Last Rate Last Dose  . acetaminophen (TYLENOL) tablet 650 mg  650 mg Oral Q4H PRN Sherwood Gambler, MD      . alum & mag hydroxide-simeth (MAALOX/MYLANTA) 200-200-20 MG/5ML suspension 30 mL  30 mL Oral Q6H PRN Sherwood Gambler, MD      . busPIRone (BUSPAR) tablet 7.5 mg  7.5 mg Oral BID Sherwood Gambler, MD   7.5 mg at 07/08/17 2347  . gabapentin (NEURONTIN) capsule 300 mg  300 mg Oral TID Sherwood Gambler, MD   300 mg at 07/09/17 0037  . insulin aspart (novoLOG) injection 0-15 Units  0-15 Units Subcutaneous TID WC Sherwood Gambler, MD   15 Units at 07/08/17 2344  . insulin aspart (novoLOG) injection 0-5 Units  0-5 Units Subcutaneous QHS Sherwood Gambler, MD      . lisinopril (PRINIVIL,ZESTRIL) tablet 20 mg  20 mg Oral Daily Sherwood Gambler, MD      . nicotine (NICODERM CQ - dosed in mg/24 hours) patch 21 mg  21 mg Transdermal Daily Sherwood Gambler, MD      . ondansetron [facility]) tablet 4 mg  4 mg Oral Q8H PRN Sherwood Gambler, MD      . QUEtiapine (SEROQUEL) tablet 150 mg  150 mg Oral QHS Sherwood Gambler, MD    150 mg at 07/09/17 0038  . sertraline (ZOLOFT) tablet 50 mg  50 mg Oral Daily Sherwood Gambler, MD      . simvastatin (ZOCOR) tablet 20 mg  20 mg Oral q1800 Sherwood Gambler, MD      . venlafaxine XR (EFFEXOR-XR) 24 hr capsule 75 mg  75 mg Oral Q breakfast Sherwood Gambler, MD   75 mg at 07/09/17 [phone]  Current Outpatient Medications  Medication Sig Dispense Refill  . busPIRone (BUSPAR) 7.5 MG tablet Take 1 tablet (7.5 mg total) by mouth 2 (two) times daily. Reported on 05/31/2015 60 tablet 0  . gabapentin (NEURONTIN) 300 MG capsule Take 1 capsule (300 mg total) by mouth 3 (three) times daily. 90 capsule 3  . glipiZIDE (GLUCOTROL) 10 MG tablet Take 1 tablet (10 mg total) by mouth 2 (two) times daily before a meal. 60 tablet 3  . insulin glargine (LANTUS) 100 UNIT/ML injection Inject 0.15 mLs (15 Units total) into the skin at bedtime. 10 mL 3  . lisinopril (PRINIVIL,ZESTRIL) 20 MG tablet Take 1 tablet (20 mg total) by mouth daily. 30 tablet 3  . metFORMIN (GLUCOPHAGE) 1000 MG tablet Take 1 tablet (1,000 mg total) by mouth 2 (two) times daily. 60 tablet 3  . QUEtiapine (SEROQUEL) 50 MG tablet Take 3 tablets (150 mg total) by mouth at bedtime. For mood control/depression 90 tablet 0  . sertraline (ZOLOFT) 50 MG tablet Take 50 mg by mouth daily.    . simvastatin (ZOCOR) 20 MG tablet Take 1 tablet (20 mg total) by mouth daily at 6 PM. 30 tablet 3  . venlafaxine XR (EFFEXOR XR) 75 MG 24 hr capsule Take 1 capsule (75 mg total) by mouth daily with breakfast. 60 capsule 3  . Blood Glucose Monitoring Suppl (TRUE METRIX METER) w/Device KIT 1 Device by Does not apply route 3 (three) times daily. (Patient not taking: Reported on 09/23/2016) 1 kit 0  . ergocalciferol (DRISDOL) 50000 units capsule Take 1 capsule (50,000 Units total) by mouth once a week. (Patient not taking: Reported on 07/08/2017) 9 capsule 1  . Insulin Syringe-Needle U-100 31G X 5/16" 0.3 ML MISC 1 each by Does not apply route 4 (four) times daily -   before meals and at bedtime. (Patient not taking: Reported on 09/23/2016) 100 each 12  . Multiple Vitamin (MULTIVITAMIN) tablet Take 1 tablet by mouth daily. Reported on 08/30/2015 (Patient not taking: Reported on 09/23/2016) 30 tablet 3  . TRUEPLUS LANCETS 28G MISC 1 each by Does not apply route 3 (three) times daily. (Patient not taking: Reported on 09/23/2016) 100 each 12    Musculoskeletal: Strength & Muscle Tone: within normal limits Gait & Station: normal Patient leans: N/A  Psychiatric Specialty Exam: Physical Exam  Nursing note and vitals reviewed. Constitutional: She is oriented to person, place, and time. She appears well-developed and well-nourished.  HENT:  Head: Normocephalic and atraumatic.  Neck: Normal range of motion.  Respiratory: Effort normal.  Musculoskeletal: Normal range of motion.  Neurological: She is alert and oriented to person, place, and time.  Psychiatric: She has a normal mood and affect. Her speech is normal and behavior is normal. Judgment and thought content normal. Cognition and memory are normal.    Review of Systems  Psychiatric/Behavioral: The patient is nervous/anxious.   All other systems reviewed and are negative.   Blood pressure (!) 93/43, pulse (!) 50, temperature 98.3 F (36.8 C), temperature source Oral, resp. rate 16, height '5\' 2"'  (1.575 m), weight 64 kg (141 lb), SpO2 97 %.Body mass index is 25.79 kg/m.  General Appearance: Casual  Eye Contact:  Good  Speech:  Normal Rate  Volume:  Normal  Mood:  Anxious, mild  Affect:  Congruent  Thought Process:  Coherent and Descriptions of Associations: Intact  Orientation:  Full (Time, Place, and Person)  Thought Content:  WDL and Logical  Suicidal Thoughts:  No  Homicidal Thoughts:  No  Memory:  Immediate;   Good Recent;   Good Remote;   Good  Judgement:  Fair  Insight:  Fair  Psychomotor Activity:  Normal  Concentration:  Concentration: Good and Attention Span: Good  Recall:  Good   Fund of Knowledge:  Fair  Language:  Good  Akathisia:  No  Handed:  Right  AIMS (if indicated):   N/A  Assets:  Leisure Time Resilience Social Support  ADL's:  Intact  Cognition:  WNL  Sleep:   N/A     Treatment Plan Summary: Schizoaffective disorder, depressed type: -Started Seroquel 150 mg at bedtime for mood and psychosis -Continued Effexor 75 mg daily for depression and anxiety -Continued Buspar 7.5 mg BId for anxiety -Continued Gabapentin 300 mg TID for anxiety -Continued Zoloft 50 mg daily for depression and anxiety  Disposition: No evidence of imminent risk to self or others at present.    Waylan Boga, NP 07/09/2017 10:26 AM   Patient seen face-to-face for psychiatric evaluation, chart reviewed and case discussed with the physician extender and developed treatment plan. Reviewed the information documented and agree with the treatment plan.  Buford Dresser, DO 07/10/17 7:45 PM

## 2017-07-09 NOTE — ED Notes (Signed)
Pt moved from bed 14 to bed 26. Received report from [facility]. Pt settled in room. Sitter at bedside.

## 2017-07-09 NOTE — Progress Notes (Signed)
CSW aware of consult- patient to be seen by psych this AM. Will provided homeless resources once cleared by psych to discharge.   Kingsley Spittle, [facility] Emergency Room Clinical Social Worker [phone]

## 2017-07-09 NOTE — Progress Notes (Signed)
Charge RN brought pt belongings back to [employer] [employer]. Belongings include one trash bag full of pt's clothes and one red rolling suitcase. Was informed that belongings had already be cleared by security.

## 2017-07-09 NOTE — Discharge Instructions (Signed)
For your mental health needs, you are advised to follow up with [facility].  New and returning patients are seen at their walk-in clinic.  Walk-in hours are Monday - Friday from 8:00 am - 3:00 pm.  Walk-in patients are seen on a first come, first served basis.  Try to arrive as early as possible for he best chance of being seen the same day: ° °     [facility] °     [address] °     [place],  [address] °     [phone] °

## 2017-07-09 NOTE — BH Assessment (Signed)
[facility] Assessment Progress Note  Per Buford Dresser, DO, this pt does not require psychiatric hospitalization at this time.  Pt is to be discharged from [facility] with recommendation to follow up with [facility].  This has been included in pt's discharge instructions.  Bland Span, LCSW agrees to provide pt with information for supportive services for the homeless.  Pt's nurse has been notified.  Jalene Mullet, [place] Triage Specialist [phone]

## 2017-07-10 NOTE — [facility] Suicide Risk Assessment (Signed)
Suicide Risk Assessment  Discharge Assessment   [facility] Discharge Suicide Risk Assessment   Principal Problem: Schizoaffective disorder, depressive type [facility]) Discharge Diagnoses:  Patient Active Problem List   Diagnosis Date Noted  . Schizoaffective disorder, depressive type ([place]) [F25.1] 08/14/2011    Priority: High  . Umbilical hernia with obstruction [K42.0] 11/24/2015  . Symptomatic cholelithiasis [K80.20] 11/24/2015  . Type 2 diabetes mellitus with hyperglycemia (HCC) [E11.65]   . UNSPECIFIED ANEMIA [D64.9] 01/23/2009  . Essential hypertension [I10] 10/12/2008  . HYPERCHOLESTEROLEMIA [E78.00] 01/01/2007  . PANIC DISORDER [F41.0] 01/01/2007  . Obsessive compulsive disorder [F42.9] 01/01/2007    Total Time spent with patient: 45 minutes  Musculoskeletal: Strength & Muscle Tone: within normal limits Gait & Station: normal Patient leans: N/A  Psychiatric Specialty Exam: Physical Exam  Nursing note and vitals reviewed. Constitutional: She is oriented to person, place, and time. She appears well-developed and well-nourished.  HENT:  Head: Normocephalic.  Neck: Normal range of motion.  Respiratory: Effort normal.  Musculoskeletal: Normal range of motion.  Neurological: She is alert and oriented to person, place, and time.  Psychiatric: She has a normal mood and affect. Her speech is normal and behavior is normal. Judgment and thought content normal. Cognition and memory are normal.    Review of Systems  Psychiatric/Behavioral: The patient is nervous/anxious.   All other systems reviewed and are negative.   Blood pressure (!) 93/43, pulse (!) 50, temperature 98.3 F (36.8 C), temperature source Oral, resp. rate 16, height 5\' 2"  (1.575 m), weight 64 kg (141 lb), SpO2 97 %.Body mass index is 25.79 kg/m.  General Appearance: Casual  Eye Contact:  Good  Speech:  Normal Rate  Volume:  Normal  Mood:  Anxious, mild  Affect:  Congruent  Thought Process:  Coherent and  Descriptions of Associations: Intact  Orientation:  Full (Time, Place, and Person)  Thought Content:  WDL and Logical  Suicidal Thoughts:  No  Homicidal Thoughts:  No  Memory:  Immediate;   Good Recent;   Good Remote;   Good  Judgement:  Fair  Insight:  Fair  Psychomotor Activity:  Normal  Concentration:  Concentration: Good and Attention Span: Good  Recall:  Good  Fund of Knowledge:  Fair  Language:  Good  Akathisia:  No  Handed:  Right  AIMS (if indicated):     Assets:  Leisure Time Physical Health Resilience Social Support  ADL's:  Intact  Cognition:  WNL  Sleep:      Mental Status Per Nursing Assessment::   On Admission:   depression and psychosis  Demographic Factors:  NA  Loss Factors: Legal issues  Historical Factors: NA  Risk Reduction Factors:   Sense of responsibility to family, Positive social support and Positive therapeutic relationship  Continued Clinical Symptoms:  Anxiety, mild  Cognitive Features That Contribute To Risk:  None    Suicide Risk:  Minimal: No identifiable suicidal ideation.  Patients presenting with no risk factors but with morbid ruminations; may be classified as minimal risk based on the severity of the depressive symptoms    Plan Of Care/Follow-up recommendations:  Activity:  as tolerated  Diet:  heart healthy diet  LORD, JAMISON, NP 07/10/2017, 9:36 AM

## 2017-08-29 ENCOUNTER — Other Ambulatory Visit: Payer: Self-pay

## 2017-08-29 ENCOUNTER — Emergency Department (HOSPITAL_COMMUNITY)
Admission: EM | Admit: 2017-08-29 | Discharge: 2017-08-30 | Disposition: A | Discharge status: Home / Self Care | Payer: Medicaid Other | Attending: Emergency Medicine | Admitting: Emergency Medicine

## 2017-08-29 ENCOUNTER — Encounter (HOSPITAL_COMMUNITY): Payer: Self-pay | Admitting: Obstetrics and Gynecology

## 2017-08-29 DIAGNOSIS — Z794 Long term (current) use of insulin: Secondary | ICD-10-CM | POA: Diagnosis not present

## 2017-08-29 DIAGNOSIS — Z87891 Personal history of nicotine dependence: Secondary | ICD-10-CM | POA: Insufficient documentation

## 2017-08-29 DIAGNOSIS — F329 Major depressive disorder, single episode, unspecified: Secondary | ICD-10-CM | POA: Diagnosis not present

## 2017-08-29 DIAGNOSIS — I10 Essential (primary) hypertension: Secondary | ICD-10-CM | POA: Insufficient documentation

## 2017-08-29 DIAGNOSIS — F251 Schizoaffective disorder, depressive type: Secondary | ICD-10-CM | POA: Diagnosis present

## 2017-08-29 DIAGNOSIS — F32A Depression, unspecified: Secondary | ICD-10-CM | POA: Diagnosis present

## 2017-08-29 DIAGNOSIS — R440 Auditory hallucinations: Secondary | ICD-10-CM | POA: Diagnosis present

## 2017-08-29 DIAGNOSIS — E119 Type 2 diabetes mellitus without complications: Secondary | ICD-10-CM | POA: Diagnosis not present

## 2017-08-29 DIAGNOSIS — R443 Hallucinations, unspecified: Secondary | ICD-10-CM

## 2017-08-29 LAB — I-STAT BETA HCG BLOOD, ED (MC, WL, AP ONLY)

## 2017-08-29 LAB — SALICYLATE LEVEL

## 2017-08-29 LAB — COMPREHENSIVE METABOLIC PANEL
ALT: 27 U/L (ref 0–44)
AST: 23 U/L (ref 15–41)
Albumin: 4.5 g/dL (ref 3.5–5.0)
Alkaline Phosphatase: 103 U/L (ref 38–126)
Anion gap: 11 (ref 5–15)
BILIRUBIN TOTAL: 1.3 mg/dL — AB (ref 0.3–1.2)
BUN: 12 mg/dL (ref 6–20)
CO2: 26 mmol/L (ref 22–32)
Calcium: 9.6 mg/dL (ref 8.9–10.3)
Chloride: 103 mmol/L (ref 98–111)
Creatinine, Ser: 1 mg/dL (ref 0.44–1.00)
Glucose, Bld: 252 mg/dL — ABNORMAL HIGH (ref 70–99)
POTASSIUM: 4 mmol/L (ref 3.5–5.1)
Sodium: 140 mmol/L (ref 135–145)
TOTAL PROTEIN: 8.4 g/dL — AB (ref 6.5–8.1)

## 2017-08-29 LAB — CBC
HCT: 39.7 % (ref 36.0–46.0)
Hemoglobin: 13.5 g/dL (ref 12.0–15.0)
MCH: 30.3 pg (ref 26.0–34.0)
MCHC: 34 g/dL (ref 30.0–36.0)
MCV: 89.2 fL (ref 78.0–100.0)
PLATELETS: 315 10*3/uL (ref 150–400)
RBC: 4.45 MIL/uL (ref 3.87–5.11)
RDW: 12.7 % (ref 11.5–15.5)
WBC: 8.4 10*3/uL (ref 4.0–10.5)

## 2017-08-29 LAB — CBG MONITORING, ED: Glucose-Capillary: 290 mg/dL — ABNORMAL HIGH (ref 70–99)

## 2017-08-29 LAB — ETHANOL

## 2017-08-29 LAB — ACETAMINOPHEN LEVEL: Acetaminophen (Tylenol), Serum: <10 ug/mL — ABNORMAL LOW (ref 10–30)

## 2017-08-29 MED ORDER — INSULIN GLARGINE 100 UNIT/ML ~~LOC~~ SOLN
15.0000 [IU] | Freq: Every day | SUBCUTANEOUS | Status: DC
  Administered 2017-08-29: 15 [IU] via SUBCUTANEOUS
  Filled 2017-08-29 (×2): qty 0.15

## 2017-08-29 MED ORDER — SIMVASTATIN 20 MG PO TABS
20.0000 mg | ORAL_TABLET | Freq: Every day | ORAL | Status: DC
  Filled 2017-08-29: qty 1

## 2017-08-29 MED ORDER — METFORMIN HCL 500 MG PO TABS
1000.0000 mg | ORAL_TABLET | Freq: Two times a day (BID) | ORAL | Status: DC
  Administered 2017-08-29 – 2017-08-30 (×2): 1000 mg via ORAL
  Filled 2017-08-29 (×2): qty 2

## 2017-08-29 MED ORDER — LISINOPRIL 20 MG PO TABS
20.0000 mg | ORAL_TABLET | Freq: Every day | ORAL | Status: DC
  Administered 2017-08-30: 20 mg via ORAL
  Filled 2017-08-29: qty 1

## 2017-08-29 MED ORDER — GLIPIZIDE 10 MG PO TABS
10.0000 mg | ORAL_TABLET | Freq: Two times a day (BID) | ORAL | Status: DC
  Administered 2017-08-30: 10 mg via ORAL
  Filled 2017-08-29 (×2): qty 1

## 2017-08-29 NOTE — ED Notes (Signed)
Bed: WA29 Expected date:  Expected time:  Means of arrival:  Comments: 
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
## 2017-08-29 NOTE — ED Triage Notes (Signed)
Pt presents to the ED via [facility]. Pt has been acting strange and screaming about christmas. Pt is dressed very outlandishly. Pt is requesting to eat bacon and eggs. When the RN asked the pt if she wanted to hurt herself pt began to sob and pace around the room.  Pt reports she "is not sick" Pt is currently responding to internal stimuli and speaking about burning the bacon.

## 2017-08-29 NOTE — ED Notes (Signed)
Pt has one bag of belongings in locker 29 with a green tag. It contains her clothing, sunglasses, bracelet, and a watch.

## 2017-08-29 NOTE — Progress Notes (Addendum)
TTS consulted with [name], PA who states the pt does not meet criteria for inpt treatment. Pt is recommended to follow up with [facility] of the [place] for OPT needs.  Pt's nurse Rip Harbour, RN has been advised and states she will inform EDP Davonna Belling, MD.  Lind Covert, MSW, LCSW Therapeutic Triage Specialist  [phone]

## 2017-08-29 NOTE — ED Notes (Signed)
Pt wanded by security. 
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
## 2017-08-30 LAB — RAPID URINE DRUG SCREEN, HOSP PERFORMED
AMPHETAMINES: NOT DETECTED
BENZODIAZEPINES: NOT DETECTED
Barbiturates: NOT DETECTED
Cocaine: NOT DETECTED
OPIATES: NOT DETECTED
TETRAHYDROCANNABINOL: NOT DETECTED

## 2017-08-30 MED ORDER — QUETIAPINE FUMARATE 50 MG PO TABS: 150.0000 mg | ORAL_TABLET | Freq: Every day | ORAL | Status: DC

## 2017-08-30 MED ORDER — VENLAFAXINE HCL ER 75 MG PO CP24: 75.0000 mg | ORAL_CAPSULE | Freq: Every day | ORAL | Status: DC

## 2017-08-30 MED ORDER — GABAPENTIN 300 MG PO CAPS: 300.0000 mg | ORAL_CAPSULE | Freq: Three times a day (TID) | ORAL | Status: DC

## 2017-08-30 MED ORDER — BUSPIRONE HCL 5 MG PO TABS
7.5000 mg | ORAL_TABLET | Freq: Two times a day (BID) | ORAL | Status: DC
  Filled 2017-08-30: qty 1.5

## 2017-08-30 NOTE — Discharge Instructions (Signed)
Please follow up with [facility] [place], [place] [address] [phone]

## 2017-08-30 NOTE — Progress Notes (Signed)
Notified Dr. Alvino Chapel that patient had been cleared by TTS, not meeting requirements for in patient. Dr. Alvino Chapel wants patient to stay and be re-evaluated in the morning. Notified Trey, TTS. Hoyle Barr, RN ]

## 2017-08-30 NOTE — ED Notes (Signed)
Patient given a bus pass upon discharge.

## 2017-08-30 NOTE — BHH Counselor (Signed)
Clinician spoke to [facility] she reviewed the disposition with Dr. Alvino Chapel. Per Rip Harbour RN, Dr. Alvino Chapel recommends the pt is obseved overnight and revaluated in the morning.   Vertell Novak, MS, [facility], [facility] Triage Specialist [phone]

## 2017-08-30 NOTE — Consult Note (Addendum)
[facility] Psych ED Discharge  08/30/2017 1:11 PM [place]  MRN:  [id number] Principal Problem: Schizoaffective disorder, depressive type [facility] Discharge Diagnoses:  Patient Active Problem List   Diagnosis Date Noted  . Umbilical hernia with obstruction [K42.0] 11/24/2015  . Symptomatic cholelithiasis [K80.20] 11/24/2015  . Type 2 diabetes mellitus with hyperglycemia (HCC) [E11.65]   . Schizoaffective disorder, depressive type ([place]) [F25.1] 08/14/2011  . UNSPECIFIED ANEMIA [D64.9] 01/23/2009  . Essential hypertension [I10] 10/12/2008  . HYPERCHOLESTEROLEMIA [E78.00] 01/01/2007  . PANIC DISORDER [F41.0] 01/01/2007  . Obsessive compulsive disorder [F42.9] 01/01/2007    Subjective: Pt was seen and chart reviewed with treatment team and Dr Darleene Cleaver. Pt denies suicidal/homicidal ideation, denies auditory/visual hallucinations and does not appear to be responding to internal stimuli. Pt stated she lives with family and is getting ready to ask them to leave her house so she can have some peace and quiet. Pt is calm and cooperative in the emergency room and will be instructed to follow up with [facility] outpatient for medication management and therapy if needed. Pt has a history of schizoaffective disorder. Pt sees [facility] for her care.  Pt's UDS and BAL are negative. Pt's labs were reviewed with no abnormalities found, no other testing was ordered during this admission. Pt is stable and psychiatrically clear for discharge.   Total Time spent with patient: 45 minutes  Past Psychiatric History: As above  Past Medical History:  Past Medical History:  Diagnosis Date  . Anxiety   . Bipolar 1 disorder ([place])   . Depression   . Diabetes mellitus without complication ([place])   . Gallstones   . Hyperlipidemia   . Hypertension   . Neuropathy   . Schizophrenia ([place])   . Seizures ([place])    X1- febrile seizure as a child-none since    Past Surgical History:  Procedure  Laterality Date  . CESAREAN SECTION  05/1998   X 1  . CHOLECYSTECTOMY N/A 03/27/2016   Procedure: LAPAROSCOPIC CHOLECYSTECTOMY;  Surgeon: Olean Ree, MD;  Location: [facility] ORS;  Service: General;  Laterality: N/A;  . TONSILLECTOMY AND ADENOIDECTOMY     age 57  . TUBAL LIGATION     Family History:  Family History  Problem Relation Age of Onset  . Diabetes Mother   . Heart disease Mother   . Kidney disease Mother   . Stroke Mother   . Hyperlipidemia Mother   . Diabetes Maternal Aunt    Family Psychiatric  History: Unknown Social History:  Social History   Substance and Sexual Activity  Alcohol Use Yes  . Alcohol/week: 0.6 oz  . Types: 1 Cans of beer per week   Comment: One can of beer every 2-3 months.      Social History   Substance and Sexual Activity  Drug Use No    Social History   Socioeconomic History  . Marital status: Married    Spouse name: Not on file  . Number of children: Not on file  . Years of education: Not on file  . Highest education level: Not on file  Occupational History  . Not on file  Social Needs  . Financial resource strain: Not on file  . Food insecurity:    Worry: Not on file    Inability: Not on file  . Transportation needs:    Medical: Not on file    Non-medical: Not on file  Tobacco Use  . Smoking status: Former Smoker    Types:  Cigarettes    Last attempt to quit: 03/14/2013    Years since quitting: 4.4  . Smokeless tobacco: Never Used  . Tobacco comment: 1 cigarette1-2 times year, only when she gets stressed  Substance and Sexual Activity  . Alcohol use: Yes    Alcohol/week: 0.6 oz    Types: 1 Cans of beer per week    Comment: One can of beer every 2-3 months.   . Drug use: No  . Sexual activity: Not Currently  Lifestyle  . Physical activity:    Days per week: Not on file    Minutes per session: Not on file  . Stress: Not on file  Relationships  . Social connections:    Talks on phone: Not on file    Gets together: Not  on file    Attends religious service: Not on file    Active member of club or organization: Not on file    Attends meetings of clubs or organizations: Not on file    Relationship status: Not on file  Other Topics Concern  . Not on file  Social History Narrative  . Not on file    Has this patient used any form of tobacco in the last 30 days? (Cigarettes, Smokeless Tobacco, Cigars, and/or Pipes) Prescription not provided because: Pt declined  Current Medications: Current Facility-Administered Medications  Medication Dose Route Frequency Provider Last Rate Last Dose  . busPIRone (BUSPAR) tablet 7.5 mg  7.5 mg Oral BID Benzion Mesta, MD      . gabapentin (NEURONTIN) capsule 300 mg  300 mg Oral TID Sharde Gover, MD      . glipiZIDE (GLUCOTROL) tablet 10 mg  10 mg Oral BID AC Davonna Belling, MD   10 mg at 08/30/17 0830  . insulin glargine (LANTUS) injection 15 Units  15 Units Subcutaneous QHS Davonna Belling, MD   15 Units at 08/29/17 2303  . lisinopril (PRINIVIL,ZESTRIL) tablet 20 mg  20 mg Oral Daily Davonna Belling, MD   20 mg at 08/30/17 1014  . metFORMIN (GLUCOPHAGE) tablet 1,000 mg  1,000 mg Oral BID Davonna Belling, MD   1,000 mg at 08/30/17 1014  . QUEtiapine (SEROQUEL) tablet 150 mg  150 mg Oral QHS Lerry Cordrey, MD      . simvastatin (ZOCOR) tablet 20 mg  20 mg Oral q1800 Davonna Belling, MD      . Derrill Memo ON 08/31/2017] venlafaxine XR (EFFEXOR-XR) 24 hr capsule 75 mg  75 mg Oral Q breakfast Janique Hoefer, MD       Current Outpatient Medications  Medication Sig Dispense Refill  . Blood Glucose Monitoring Suppl (TRUE METRIX METER) w/Device KIT 1 Device by Does not apply route 3 (three) times daily. 1 kit 0  . busPIRone (BUSPAR) 7.5 MG tablet Take 1 tablet (7.5 mg total) by mouth 2 (two) times daily. Reported on 05/31/2015 (Patient not taking: Reported on 08/30/2017) 60 tablet 0  . gabapentin (NEURONTIN) 300 MG capsule Take 1 capsule (300 mg total) by mouth 3  (three) times daily. (Patient not taking: Reported on 08/30/2017) 90 capsule 3  . glipiZIDE (GLUCOTROL) 10 MG tablet Take 1 tablet (10 mg total) by mouth 2 (two) times daily before a meal. (Patient not taking: Reported on 08/30/2017) 60 tablet 3  . insulin glargine (LANTUS) 100 UNIT/ML injection Inject 0.15 mLs (15 Units total) into the skin at bedtime. (Patient not taking: Reported on 08/30/2017) 10 mL 3  . Insulin Syringe-Needle U-100 31G X 5/16" 0.3 ML MISC 1 each  by Does not apply route 4 (four) times daily -  before meals and at bedtime. 100 each 12  . lisinopril (PRINIVIL,ZESTRIL) 20 MG tablet Take 1 tablet (20 mg total) by mouth daily. (Patient not taking: Reported on 08/30/2017) 30 tablet 3  . metFORMIN (GLUCOPHAGE) 1000 MG tablet Take 1 tablet (1,000 mg total) by mouth 2 (two) times daily. (Patient not taking: Reported on 08/30/2017) 60 tablet 3  . QUEtiapine (SEROQUEL) 50 MG tablet Take 3 tablets (150 mg total) by mouth at bedtime. For mood control/depression (Patient not taking: Reported on 08/30/2017) 90 tablet 0  . simvastatin (ZOCOR) 20 MG tablet Take 1 tablet (20 mg total) by mouth daily at 6 PM. (Patient not taking: Reported on 08/30/2017) 30 tablet 3  . TRUEPLUS LANCETS 28G MISC 1 each by Does not apply route 3 (three) times daily. 100 each 12  . venlafaxine XR (EFFEXOR XR) 75 MG 24 hr capsule Take 1 capsule (75 mg total) by mouth daily with breakfast. (Patient not taking: Reported on 08/30/2017) 60 capsule 3   PTA Medications:  (Not in a hospital admission)  Musculoskeletal: Strength & Muscle Tone: within normal limits Gait & Station: normal Patient leans: N/A  Psychiatric Specialty Exam: Physical Exam  Constitutional: She is oriented to person, place, and time. She appears well-developed and well-nourished.  HENT:  Head: Normocephalic.  Respiratory: Effort normal.  Musculoskeletal: Normal range of motion.  Neurological: She is alert and oriented to person, place, and time.   Psychiatric: Her speech is normal and behavior is normal. Thought content normal. Her mood appears not anxious. Cognition and memory are normal. She expresses impulsivity. She exhibits a depressed mood.    Review of Systems  Psychiatric/Behavioral: Positive for depression. Negative for hallucinations, memory loss, substance abuse and suicidal ideas. The patient is not nervous/anxious and does not have insomnia.   All other systems reviewed and are negative.   Blood pressure 136/69, pulse (!) 58, temperature 98.5 F (36.9 C), temperature source Oral, resp. rate 16, SpO2 99 %.There is no height or weight on file to calculate BMI.  General Appearance: Casual  Eye Contact:  Good  Speech:  Clear and Coherent and Normal Rate  Volume:  Normal  Mood:  Depressed  Affect:  Congruent and Depressed  Thought Process:  Coherent and Linear  Orientation:  Full (Time, Place, and Person)  Thought Content:  Logical  Suicidal Thoughts:  No  Homicidal Thoughts:  No  Memory:  Immediate;   Good Recent;   Good Remote;   Fair  Judgement:  Fair  Insight:  Fair  Psychomotor Activity:  Normal  Concentration:  Concentration: Good and Attention Span: Good  Recall:  Good  Fund of Knowledge:  Good  Language:  Good  Akathisia:  No  Handed:  Right  AIMS (if indicated):     Assets:  Agricultural consultant Housing Social Support  ADL's:  Intact  Cognition:  WNL  Sleep:        Demographic Factors:  Low socioeconomic status and Unemployed  Loss Factors: Financial problems/change in socioeconomic status  Historical Factors: Impulsivity  Risk Reduction Factors:   Sense of responsibility to family  Continued Clinical Symptoms:  Bipolar Disorder:   Mixed State Depression:   Impulsivity  Cognitive Features That Contribute To Risk:  Closed-mindedness    Suicide Risk:  Minimal: No identifiable suicidal ideation.  Patients presenting with no risk factors but with morbid  ruminations; may be classified as minimal risk based on the  severity of the depressive symptoms  Follow-up Information    [facility] Follow up.   Why:  Please follow up with [facility] information: [place] [phone]           Plan Of Care/Follow-up recommendations:  Activity:  as tolerated  Diet:  Heart healthy  Disposition: Discharge Home Take all medications as prescribed to you by your outpatient provider Follow up with [facility] for your medication management and therapy needs.  Follow up with your outpatient PCP for and medical needs you may have.   Ethelene Hal, NP 08/30/2017, 1:11 PM  Patient seen face-to-face for psychiatric evaluation, chart reviewed and case discussed with the physician extender and developed treatment plan. Reviewed the information documented and agree with the treatment plan. [name], MD

## 2017-09-16 ENCOUNTER — Emergency Department (HOSPITAL_COMMUNITY)
Admission: EM | Admit: 2017-09-16 | Discharge: 2017-09-17 | Disposition: A | Discharge status: Home / Self Care | Payer: Medicaid Other | Attending: Emergency Medicine | Admitting: Emergency Medicine

## 2017-09-16 ENCOUNTER — Encounter (HOSPITAL_COMMUNITY): Payer: Self-pay | Admitting: Emergency Medicine

## 2017-09-16 DIAGNOSIS — B373 Candidiasis of vulva and vagina: Secondary | ICD-10-CM | POA: Insufficient documentation

## 2017-09-16 DIAGNOSIS — Z9114 Patient's other noncompliance with medication regimen: Secondary | ICD-10-CM | POA: Diagnosis not present

## 2017-09-16 DIAGNOSIS — E1165 Type 2 diabetes mellitus with hyperglycemia: Secondary | ICD-10-CM | POA: Diagnosis present

## 2017-09-16 DIAGNOSIS — Z794 Long term (current) use of insulin: Secondary | ICD-10-CM | POA: Diagnosis not present

## 2017-09-16 DIAGNOSIS — R3129 Other microscopic hematuria: Secondary | ICD-10-CM

## 2017-09-16 DIAGNOSIS — Z87891 Personal history of nicotine dependence: Secondary | ICD-10-CM | POA: Diagnosis not present

## 2017-09-16 DIAGNOSIS — I10 Essential (primary) hypertension: Secondary | ICD-10-CM | POA: Insufficient documentation

## 2017-09-16 DIAGNOSIS — Z79899 Other long term (current) drug therapy: Secondary | ICD-10-CM | POA: Insufficient documentation

## 2017-09-16 DIAGNOSIS — R739 Hyperglycemia, unspecified: Secondary | ICD-10-CM

## 2017-09-16 DIAGNOSIS — B3731 Acute candidiasis of vulva and vagina: Secondary | ICD-10-CM

## 2017-09-16 LAB — CBC WITH DIFFERENTIAL/PLATELET
Abs Immature Granulocytes: 0 10*3/uL (ref 0.0–0.1)
Basophils Absolute: 0 10*3/uL (ref 0.0–0.1)
Basophils Relative: 1 %
EOS ABS: 0.1 10*3/uL (ref 0.0–0.7)
Eosinophils Relative: 1 %
HEMATOCRIT: 37.4 % (ref 36.0–46.0)
Hemoglobin: 12.2 g/dL (ref 12.0–15.0)
IMMATURE GRANULOCYTES: 0 %
LYMPHS ABS: 2 10*3/uL (ref 0.7–4.0)
LYMPHS PCT: 30 %
MCH: 29.5 pg (ref 26.0–34.0)
MCHC: 32.6 g/dL (ref 30.0–36.0)
MCV: 90.3 fL (ref 78.0–100.0)
Monocytes Absolute: 0.6 10*3/uL (ref 0.1–1.0)
Monocytes Relative: 10 %
NEUTROS ABS: 3.9 10*3/uL (ref 1.7–7.7)
NEUTROS PCT: 58 %
PLATELETS: 246 10*3/uL (ref 150–400)
RBC: 4.14 MIL/uL (ref 3.87–5.11)
RDW: 12.6 % (ref 11.5–15.5)
WBC: 6.7 10*3/uL (ref 4.0–10.5)

## 2017-09-16 LAB — COMPREHENSIVE METABOLIC PANEL
ALBUMIN: 3.9 g/dL (ref 3.5–5.0)
ALT: 25 U/L (ref 0–44)
AST: 18 U/L (ref 15–41)
Alkaline Phosphatase: 111 U/L (ref 38–126)
Anion gap: 10 (ref 5–15)
BILIRUBIN TOTAL: 0.9 mg/dL (ref 0.3–1.2)
BUN: 16 mg/dL (ref 6–20)
CHLORIDE: 100 mmol/L (ref 98–111)
CO2: 28 mmol/L (ref 22–32)
Calcium: 9.2 mg/dL (ref 8.9–10.3)
Creatinine, Ser: 1.77 mg/dL — ABNORMAL HIGH (ref 0.44–1.00)
GFR calc Af Amer: 36 mL/min — ABNORMAL LOW (ref >60)
GFR calc non Af Amer: 31 mL/min — ABNORMAL LOW (ref >60)
GLUCOSE: 402 mg/dL — AB (ref 70–99)
POTASSIUM: 4.3 mmol/L (ref 3.5–5.1)
Sodium: 138 mmol/L (ref 135–145)
Total Protein: 7.3 g/dL (ref 6.5–8.1)

## 2017-09-16 LAB — CBG MONITORING, ED: Glucose-Capillary: 435 mg/dL — ABNORMAL HIGH (ref 70–99)

## 2017-09-16 NOTE — ED Notes (Signed)
Pt cannot void at this time

## 2017-09-16 NOTE — ED Triage Notes (Signed)
Patient arrived with EMS from home reports fatigue/generalized weakness this week and chronic yeast infection . History of DM with no medications , CBG= 498 by EMS , alert and oriented/respirations unlabored , denies fever or chills .

## 2017-09-16 NOTE — ED Notes (Signed)
Pt CBG was 435, notified Bobby(RN)

## 2017-09-17 ENCOUNTER — Emergency Department (HOSPITAL_COMMUNITY): Payer: Medicaid Other

## 2017-09-17 LAB — I-STAT CHEM 8, ED
BUN: 18 mg/dL (ref 6–20)
CREATININE: 1 mg/dL (ref 0.44–1.00)
Calcium, Ion: 1.11 mmol/L — ABNORMAL LOW (ref 1.15–1.40)
Chloride: 99 mmol/L (ref 98–111)
GLUCOSE: 375 mg/dL — AB (ref 70–99)
HEMATOCRIT: 38 % (ref 36.0–46.0)
HEMOGLOBIN: 12.9 g/dL (ref 12.0–15.0)
Potassium: 4 mmol/L (ref 3.5–5.1)
Sodium: 137 mmol/L (ref 135–145)
TCO2: 27 mmol/L (ref 22–32)

## 2017-09-17 LAB — URINALYSIS, ROUTINE W REFLEX MICROSCOPIC
Bilirubin Urine: NEGATIVE
Glucose, UA: >=500 mg/dL — AB
KETONES UR: NEGATIVE mg/dL
Nitrite: NEGATIVE
PH: 5 (ref 5.0–8.0)
Protein, ur: NEGATIVE mg/dL
SPECIFIC GRAVITY, URINE: 1.023 (ref 1.005–1.030)

## 2017-09-17 LAB — CBG MONITORING, ED
GLUCOSE-CAPILLARY: 353 mg/dL — AB (ref 70–99)
Glucose-Capillary: 420 mg/dL — ABNORMAL HIGH (ref 70–99)

## 2017-09-17 MED ORDER — FLUCONAZOLE 100 MG PO TABS
200.0000 mg | ORAL_TABLET | Freq: Once | ORAL | Status: AC
  Administered 2017-09-17: 200 mg via ORAL
  Filled 2017-09-17: qty 2

## 2017-09-17 MED ORDER — SODIUM CHLORIDE 0.9 % IV BOLUS
500.0000 mL | Freq: Once | INTRAVENOUS | Status: AC
  Administered 2017-09-17: 500 mL via INTRAVENOUS

## 2017-09-17 MED ORDER — SODIUM CHLORIDE 0.9 % IV BOLUS
1000.0000 mL | Freq: Once | INTRAVENOUS | Status: AC
  Administered 2017-09-17: 1000 mL via INTRAVENOUS

## 2017-09-17 NOTE — ED Notes (Signed)
Pt called for vitals with no response. 

## 2017-09-17 NOTE — Discharge Instructions (Addendum)
Continue to take your home medicines as prescribed especially your diabetes and blood pressure medicines.  Drink plenty water and get plenty of rest.  Follow-up with your primary care physician or make an appointment at [facility] health and wellness for reevaluation.  You had some blood in your urine, please follow-up with your PCP for recheck of your urine.  Return to the emergency department for any worsening signs or symptoms including shortness of breath, chest pain, abdominal pain, vomiting, fevers.  If your blood pressure (BP) was elevated on multiple readings during this visit above 130 for the top number or above 80 for the bottom number, please have this repeated by your primary care provider within one month. You can also check your blood pressure when you are out at a pharmacy or grocery store. Many have machines that will check your blood pressure.  If your blood pressure remains elevated, please follow-up with your PCP.

## 2017-09-17 NOTE — ED Provider Notes (Signed)
Received patient at signout from [facility].  Refer to provider note for full history and physical examination.  Briefly, patient is a 57 year old female with history of bipolar disorder, diabetes, hypertension presenting with vaginal itching, hyperglycemia, and generalized fatigue.  She is noncompliant with her home medicines.  History of frequent vaginal candidiasis, treated with Diflucan in the ED.  Found to be hyperglycemic in the ED but no evidence of DKA.  She does have some hematuria on UA, awaiting CT renal stone study for further evaluation.  If renal stone study is unremarkable, patient stable for discharge home with instructions to be compliant with her home medicines and follow-up with her PCP.   MDM  Renal stone study without any acute abnormalities, no evidence of pyelonephritis or nephrolithiasis.  Repeat creatinine is within normal limits and has improved after fluid bolus.  Suspect dehydration causing slight elevation in creatinine on initial presentation.  She is stable for discharge home with follow-up with her PCP.  I had a long discussion regarding the importance of medication compliance.  Discussed strict ED return precautions. Pt verbalized understanding of and agreement with plan and is safe for discharge home at this time.     Renita Papa, PA-C 09/17/17 7026    Merryl Hacker, MD 09/19/17 [phone]

## 2017-09-17 NOTE — ED Notes (Signed)
Pt discharged from ED; instructions provided; Pt encouraged to return to ED if symptoms worsen and to f/u with PCP; Pt verbalized understanding of all instructions 

## 2017-09-17 NOTE — ED Notes (Signed)
Pt ambulated to bathroom gait steady.

## 2017-09-17 NOTE — ED Provider Notes (Signed)
[facility] EMERGENCY DEPARTMENT Provider Note   CSN: [id number] Arrival date & time: 09/16/17  2212     History   Chief Complaint Chief Complaint  Patient presents with  . Diabetes    Hyperglycemia- no meds    HPI Lorraine Turner is a 57 y.o. female.  HPI 56 year old African-American female past medical history significant for bipolar disorder, diabetes, anxiety, hypertension presents to the ED for evaluation of vaginal itching and elevated blood sugars.  States that yesterday she took her blood sugar medication for the first time in 2 weeks.  She states she does not take this regularly because of the "side effects she hears about".  The patient states that she was working in the yard and became generally fatigued, weak with some dizziness.  States that she thought she was dehydrated and went to get some energy drink.  States that her symptoms slightly improved however when she woke up she just felt fatigued.  She called 911 and EMS states that her blood sugar was elevated.  She came to the ED for further evaluation.  Patient has a history of poor compliance of medications.  She has not followed up with her primary care doctor recently.  Has been to the ED for similar symptoms.  She states that she was given fluids in route by EMS and this completely resolved her symptoms of weakness and fatigue.  Patient denies chest pain or shortness of breath.  Denies any lightheadedness or dizziness at this time.  Denies any vision changes or headache.  Reports vaginal itching for the past 2 weeks.  She reports having some left flank pain over the past week but currently denies any at this time.  Denies any urinary symptoms.  Denies any vaginal discharge.  Denies any concern for STD or being sexually active.  Pt denies any fever, chill, ha, vision changes, congestion, neck pain, cp, sob, cough,  urinary symptoms, change in bowel habits, melena, hematochezia, lower extremity  paresthesias.  Past Medical History:  Diagnosis Date  . Anxiety   . Bipolar 1 disorder ([place])   . Depression   . Diabetes mellitus without complication ([place])   . Gallstones   . Hyperlipidemia   . Hypertension   . Neuropathy   . Schizophrenia ([place])   . Seizures ([place])    X1- febrile seizure as a child-none since    Patient Active Problem List   Diagnosis Date Noted  . Umbilical hernia with obstruction 11/24/2015  . Symptomatic cholelithiasis 11/24/2015  . Type 2 diabetes mellitus with hyperglycemia ([place])   . Schizoaffective disorder, depressive type ([place]) 08/14/2011  . UNSPECIFIED ANEMIA 01/23/2009  . Essential hypertension 10/12/2008  . HYPERCHOLESTEROLEMIA 01/01/2007  . PANIC DISORDER 01/01/2007  . Obsessive compulsive disorder 01/01/2007    Past Surgical History:  Procedure Laterality Date  . CESAREAN SECTION  05/1998   X 1  . CHOLECYSTECTOMY N/A 03/27/2016   Procedure: LAPAROSCOPIC CHOLECYSTECTOMY;  Surgeon: Olean Ree, MD;  Location: [facility] ORS;  Service: General;  Laterality: N/A;  . TONSILLECTOMY AND ADENOIDECTOMY     age 81  . TUBAL LIGATION       OB History   None      Home Medications    Prior to Admission medications   Medication Sig Start Date End Date Taking? Authorizing Provider  gabapentin (NEURONTIN) 300 MG capsule Take 1 capsule (300 mg total) by mouth 3 (three) times daily. Patient taking differently: Take 300 mg by mouth once.  07/09/17  Yes Patrecia Pour, NP  glipiZIDE (GLUCOTROL) 10 MG tablet Take 1 tablet (10 mg total) by mouth 2 (two) times daily before a meal. Patient taking differently: Take 10 mg by mouth once.  03/10/17  Yes [name], MD  metFORMIN (GLUCOPHAGE) 1000 MG tablet Take 1 tablet (1,000 mg total) by mouth 2 (two) times daily. Patient taking differently: Take 1,000 mg by mouth once.  03/10/17  Yes [name], MD  Blood Glucose Monitoring Suppl (TRUE METRIX METER) w/Device KIT 1 Device by Does not apply route 3 (three)  times daily. 08/17/15   Argentina Donovan, PA-C  busPIRone (BUSPAR) 7.5 MG tablet Take 1 tablet (7.5 mg total) by mouth 2 (two) times daily. Reported on 05/31/2015 Patient not taking: Reported on 08/30/2017 07/09/17   Patrecia Pour, NP  insulin glargine (LANTUS) 100 UNIT/ML injection Inject 0.15 mLs (15 Units total) into the skin at bedtime. Patient not taking: Reported on 08/30/2017 03/10/17   [name], MD  Insulin Syringe-Needle U-100 31G X 5/16" 0.3 ML MISC 1 each by Does not apply route 4 (four) times daily -  before meals and at bedtime. 08/30/15   [name], MD  lisinopril (PRINIVIL,ZESTRIL) 20 MG tablet Take 1 tablet (20 mg total) by mouth daily. Patient not taking: Reported on 08/30/2017 03/10/17   [name], MD  QUEtiapine (SEROQUEL) 50 MG tablet Take 3 tablets (150 mg total) by mouth at bedtime. For mood control/depression Patient not taking: Reported on 08/30/2017 07/09/17   Patrecia Pour, NP  simvastatin (ZOCOR) 20 MG tablet Take 1 tablet (20 mg total) by mouth daily at 6 PM. Patient not taking: Reported on 08/30/2017 03/10/17   [name], MD  TRUEPLUS LANCETS 28G MISC 1 each by Does not apply route 3 (three) times daily. 04/09/16   [name], MD  venlafaxine XR (EFFEXOR XR) 75 MG 24 hr capsule Take 1 capsule (75 mg total) by mouth daily with breakfast. Patient not taking: Reported on 08/30/2017 07/09/17   Patrecia Pour, NP    Family History Family History  Problem Relation Age of Onset  . Diabetes Mother   . Heart disease Mother   . Kidney disease Mother   . Stroke Mother   . Hyperlipidemia Mother   . Diabetes Maternal Aunt     Social History Social History   Tobacco Use  . Smoking status: Former Smoker    Types: Cigarettes    Last attempt to quit: 03/14/2013    Years since quitting: 4.5  . Smokeless tobacco: Never Used  . Tobacco comment: 1 cigarette1-2 times year, only when she gets stressed  Substance Use Topics  . Alcohol use: Yes     Alcohol/week: 0.6 oz    Types: 1 Cans of beer per week    Comment: One can of beer every 2-3 months.   . Drug use: No     Allergies   Patient has no known allergies.   Review of Systems Review of Systems  All other systems reviewed and are negative.    Physical Exam Updated Vital Signs BP 134/68 (BP Location: Right Arm)   Pulse 67   Temp 98.9 F (37.2 C) (Oral)   Resp 20   SpO2 100%   Physical Exam  Constitutional: She is oriented to person, place, and time. She appears well-developed and well-nourished.  Non-toxic appearance. No distress.  HENT:  Head: Normocephalic and atraumatic.  Nose: Nose normal.  Mouth/Throat: Oropharynx is clear and moist.  Eyes: Pupils  are equal, round, and reactive to light. Conjunctivae are normal. Right eye exhibits no discharge. Left eye exhibits no discharge.  Neck: Normal range of motion. Neck supple.  Cardiovascular: Normal rate, regular rhythm, normal heart sounds and intact distal pulses. Exam reveals no gallop and no friction rub.  No murmur heard. Pulmonary/Chest: Effort normal and breath sounds normal. No stridor. No respiratory distress. She has no wheezes. She has no rales. She exhibits no tenderness.  Abdominal: Soft. Normal appearance and bowel sounds are normal. There is no tenderness. There is no rigidity, no rebound, no guarding, no CVA tenderness, no tenderness at McBurney's point and negative Murphy's sign.  Musculoskeletal: Normal range of motion. She exhibits no tenderness.  Lymphadenopathy:    She has no cervical adenopathy.  Neurological: She is alert and oriented to person, place, and time.  Skin: Skin is warm and dry. Capillary refill takes less than 2 seconds.  Psychiatric: Her behavior is normal. Judgment and thought content normal.  Nursing note and vitals reviewed.    ED Treatments / Results  Labs (all labs ordered are listed, but only abnormal results are displayed) Labs Reviewed  COMPREHENSIVE METABOLIC  PANEL - Abnormal; Notable for the following components:      Result Value   Glucose, Bld 402 (*)    Creatinine, Ser 1.77 (*)    GFR calc non Af Amer 31 (*)    GFR calc Af Amer 36 (*)    All other components within normal limits  URINALYSIS, ROUTINE W REFLEX MICROSCOPIC - Abnormal; Notable for the following components:   APPearance CLOUDY (*)    Glucose, UA >=500 (*)    Hgb urine dipstick SMALL (*)    Leukocytes, UA LARGE (*)    Bacteria, UA RARE (*)    All other components within normal limits  CBG MONITORING, ED - Abnormal; Notable for the following components:   Glucose-Capillary 435 (*)    All other components within normal limits  CBG MONITORING, ED - Abnormal; Notable for the following components:   Glucose-Capillary 420 (*)    All other components within normal limits  URINE CULTURE  CBC WITH DIFFERENTIAL/PLATELET    EKG None  Radiology No results found.  Procedures Procedures (including critical care time)  Medications Ordered in ED Medications  sodium chloride 0.9 % bolus 500 mL (500 mLs Intravenous New Bag/Given 09/17/17 0523)  sodium chloride 0.9 % bolus 1,000 mL (1,000 mLs Intravenous New Bag/Given 09/17/17 0518)  fluconazole (DIFLUCAN) tablet 200 mg (200 mg Oral Given 09/17/17 0521)     Initial Impression / Assessment and Plan / ED Course  I have reviewed the triage vital signs and the nursing notes.  Pertinent labs & imaging results that were available during my care of the patient were reviewed by me and considered in my medical decision making (see chart for details).     Patient resents to the ED for hyperglycemia, generalized weakness, fatigue, vaginal itching and left flank pain.  History of diabetes who is noncompliant with medications and has poor primary care follow-up.  Denies urinary symptoms.  Vital signs reassuring.  Lab work shows no leukocytosis.  Mild elevation in creatinine to 1.77.  Otherwise labs reassuring.  Normal anion gap and normal bicarb.   No ketones in the urine.  Doubt DKA.  Blood sugar improved with fluids.  UA shows large amounts of leukocytes, RBCs but rare bacteria with squamous epithelial cells.  Patient treated for yeast infection with Diflucan.  Denies being sexually active  or vaginal discharge.  No concern for STD and no indication for pelvic exam.  Her left flank pain with blood in urine will do renal stone study however have low suspicion for pyelonephritis or nephrolithiasis at this time.  Patient will need close outpatient follow-up and has been encouraged to continue taking her diabetes medications.  Care handoff to [place]. Pt has pending at this time .  Disposition likely home pending lab and test results. Care dicussed and plan agreed upon with oncoming PA. Pt updated on plan of care and is currently hemodynamically stable at this time with normal vs.     Final Clinical Impressions(s) / ED Diagnoses   Final diagnoses:  Hyperglycemia  Candida vaginitis    ED Discharge Orders    None       Aaron Edelman 09/17/17 [id number]    Merryl Hacker, MD 09/17/17 [phone]

## 2017-09-18 LAB — URINE CULTURE

## 2017-10-03 ENCOUNTER — Ambulatory Visit: Payer: Medicaid Other | Attending: Nurse Practitioner | Admitting: Nurse Practitioner

## 2017-10-03 VITALS — BP 146/78 | HR 73 | Temp 99.2°F | Ht 62.0 in | Wt 139.5 lb

## 2017-10-03 DIAGNOSIS — I10 Essential (primary) hypertension: Secondary | ICD-10-CM | POA: Diagnosis not present

## 2017-10-03 DIAGNOSIS — F419 Anxiety disorder, unspecified: Secondary | ICD-10-CM | POA: Diagnosis not present

## 2017-10-03 DIAGNOSIS — R319 Hematuria, unspecified: Secondary | ICD-10-CM | POA: Insufficient documentation

## 2017-10-03 DIAGNOSIS — Z841 Family history of disorders of kidney and ureter: Secondary | ICD-10-CM | POA: Insufficient documentation

## 2017-10-03 DIAGNOSIS — Z79899 Other long term (current) drug therapy: Secondary | ICD-10-CM | POA: Insufficient documentation

## 2017-10-03 DIAGNOSIS — F319 Bipolar disorder, unspecified: Secondary | ICD-10-CM | POA: Diagnosis not present

## 2017-10-03 DIAGNOSIS — Z823 Family history of stroke: Secondary | ICD-10-CM | POA: Diagnosis not present

## 2017-10-03 DIAGNOSIS — Z8249 Family history of ischemic heart disease and other diseases of the circulatory system: Secondary | ICD-10-CM | POA: Insufficient documentation

## 2017-10-03 DIAGNOSIS — Z9889 Other specified postprocedural states: Secondary | ICD-10-CM | POA: Diagnosis not present

## 2017-10-03 DIAGNOSIS — Z794 Long term (current) use of insulin: Secondary | ICD-10-CM | POA: Diagnosis not present

## 2017-10-03 DIAGNOSIS — F209 Schizophrenia, unspecified: Secondary | ICD-10-CM | POA: Insufficient documentation

## 2017-10-03 DIAGNOSIS — E1165 Type 2 diabetes mellitus with hyperglycemia: Secondary | ICD-10-CM | POA: Diagnosis present

## 2017-10-03 DIAGNOSIS — Z9049 Acquired absence of other specified parts of digestive tract: Secondary | ICD-10-CM | POA: Insufficient documentation

## 2017-10-03 DIAGNOSIS — E785 Hyperlipidemia, unspecified: Secondary | ICD-10-CM | POA: Insufficient documentation

## 2017-10-03 DIAGNOSIS — Z833 Family history of diabetes mellitus: Secondary | ICD-10-CM | POA: Diagnosis not present

## 2017-10-03 DIAGNOSIS — Z760 Encounter for issue of repeat prescription: Secondary | ICD-10-CM | POA: Diagnosis not present

## 2017-10-03 DIAGNOSIS — Z9119 Patient's noncompliance with other medical treatment and regimen: Secondary | ICD-10-CM | POA: Diagnosis not present

## 2017-10-03 DIAGNOSIS — E7800 Pure hypercholesterolemia, unspecified: Secondary | ICD-10-CM

## 2017-10-03 DIAGNOSIS — E1140 Type 2 diabetes mellitus with diabetic neuropathy, unspecified: Secondary | ICD-10-CM | POA: Insufficient documentation

## 2017-10-03 LAB — POCT URINALYSIS DIP (CLINITEK)
BILIRUBIN UA: NEGATIVE mg/dL
Bilirubin, UA: NEGATIVE
Blood, UA: NEGATIVE
Glucose, UA: 100 mg/dL — AB
Leukocytes, UA: NEGATIVE
Nitrite, UA: NEGATIVE
PH UA: 6 (ref 5.0–8.0)
SPEC GRAV UA: 1.02 (ref 1.010–1.025)
Urobilinogen, UA: 0.2 E.U./dL

## 2017-10-03 LAB — POCT GLYCOSYLATED HEMOGLOBIN (HGB A1C): HEMOGLOBIN A1C: 11.8 % — AB (ref 4.0–5.6)

## 2017-10-03 LAB — GLUCOSE, POCT (MANUAL RESULT ENTRY): POC Glucose: 182 mg/dl — AB (ref 70–99)

## 2017-10-03 MED ORDER — SIMVASTATIN 20 MG PO TABS: 20.0000 mg | ORAL_TABLET | Freq: Every day | ORAL | 3 refills | Status: DC

## 2017-10-03 MED ORDER — INSULIN GLARGINE 100 UNIT/ML ~~LOC~~ SOLN: 15.0000 [IU] | Freq: Every day | SUBCUTANEOUS | 3 refills | Status: DC

## 2017-10-03 MED ORDER — GLIPIZIDE 10 MG PO TABS: 10.0000 mg | ORAL_TABLET | Freq: Two times a day (BID) | ORAL | 3 refills | Status: DC

## 2017-10-03 MED ORDER — METFORMIN HCL 1000 MG PO TABS: 1000.0000 mg | ORAL_TABLET | Freq: Two times a day (BID) | ORAL | 3 refills | Status: DC

## 2017-10-03 NOTE — Progress Notes (Signed)
Assessment & Plan:  [name] was seen today for hospitalization follow-up and medication refill.  Diagnoses and all orders for this visit:  Type 2 diabetes mellitus with hyperglycemia, with long-term current use of insulin (HCC) -     Glucose (CBG) -     HgB A1c -     glipiZIDE (GLUCOTROL) 10 MG tablet; Take 1 tablet (10 mg total) by mouth 2 (two) times daily before a meal. -     Discontinue: insulin glargine (LANTUS) 100 UNIT/ML injection; Inject 0.15 mLs (15 Units total) into the skin at bedtime. -     metFORMIN (GLUCOPHAGE) 1000 MG tablet; Take 1 tablet (1,000 mg total) by mouth 2 (two) times daily. -     insulin glargine (LANTUS) 100 UNIT/ML injection; Inject 0.15 mLs (15 Units total) into the skin at bedtime. Continue blood sugar control as discussed in office today, low carbohydrate diet, and regular physical exercise as tolerated, 150 minutes per week (30 min each day, 5 days per week, or 50 min 3 days per week). Keep blood sugar logs with fasting goal of 90-130 mg/dl, post prandial (after you eat) less than 180.  For Hypoglycemia: BS <60 and Hyperglycemia BS >400; contact the clinic ASAP. Annual eye exams and foot exams are recommended.  HYPERCHOLESTEROLEMIA -     simvastatin (ZOCOR) 20 MG tablet; Take 1 tablet (20 mg total) by mouth daily at 6 PM. INSTRUCTIONS: Work on a low fat, heart healthy diet and participate in regular aerobic exercise program by working out at least 150 minutes per week; 5 days a week-30 minutes per day. Avoid red meat, fried foods. junk foods, sodas, sugary drinks, unhealthy snacking, alcohol and smoking.  Drink at least 48oz of water per day and monitor your carbohydrate intake daily.    Hematuria, unspecified type -     Cancel: Urinalysis Dipstick -     POCT URINALYSIS DIP (CLINITEK)  Essential hypertension Continue all antihypertensives as prescribed.  Remember to bring in your blood pressure log with you for your follow up appointment.    DASH/Mediterranean Diets are healthier choices for HTN.    Patient has been counseled on age-appropriate routine health concerns for screening and prevention. These are reviewed and up-to-date. Referrals have been placed accordingly. Immunizations are up-to-date or declined.    Subjective:   Chief Complaint  Patient presents with  . Hospitalization Follow-up    Pt. is here for hospital follow-up on diabetes.   . Medication Refill   HPI Lorraine Turner 57 y.o. female presents to office today for hospital follow up to DM  Type 2 Diabetes Mellitus She was seen in the ED on 09-16-2017 with complaints of vaginal itching, hyperglycemia and fatigue. UA was noted for hematuria however renal stone study was negative. There was also slight elevation of Cr+ however after IVF bolus Cr + returned to normal. It was felt that dehydration was a factor causing her hematuria. She currently denies any symptoms of vaginitis or UTI but is requesting a UA to determine if she still has blood in her urine.  Diabetes has been improving however still elevated.  There are no hypoglycemic symptoms.  Symptoms are stable. There are diabetic complications. Risk factors for coronary artery disease include family history, dyslipidemia, diabetes mellitus, hypertension, sedentary lifestyle and stress. Her husband died last year and she reports drinking excessive amounts of alcohol but has recently stopped drinking and wants to take better care of herslef.  Current diabetic treatment includes GLIPIZIDE 10 MG  BID (she only takes QD), metformin 1000 mg BID (only taking Daily).  Patient is not compliant with treatment all of the time and monitors blood glucose at home 2 times per day.  Home blood glucose trend : (FBS 120-130 mg/dl)  Weight is  stable. Patient follows a generally healthy diet. Meal planning includes avoidance of concentrated sweets. Patient has not seen a dietician. Patient is not compliant with exercise.   An ACE  inhibitor/angiotensin II receptor blocker is being taken. Patient does not see a podiatrist. Eye exam is not current.  Lab Results  Component Value Date   HGBA1C 11.8 (A) 10/03/2017   Lab Results  Component Value Date   HGBA1C 13.0 03/10/2017   Hyperlipidemia Patient presents for follow up to hyperlipidemia.  She reports being medication compliant however she has a history of noncompliance with simvastatin 44m. She  denies chest pain, dyspnea, exertional chest pressure/discomfort, palpitations and poor exercise tolerance or statin intolerance including myalgias. LDL not at goal of <70. Lab Results  Component Value Date   CHOL 143 04/09/2016   Lab Results  Component Value Date   HDL 54 04/09/2016   Lab Results  Component Value Date   LDLCALC 80 04/09/2016   Lab Results  Component Value Date   TRIG 43 04/09/2016   Lab Results  Component Value Date   CHOLHDL 2.6 04/09/2016    Essential Hypertension Chronic and not well controlled.  She did not take her blood pressure medication today of lisinopril 20 mg.  Does not monitor her blood pressure at home and therefore does not have a blood pressure log for review today.  Will not make any adjustments with her antihypertensive as she is not taking lisinopril as prescribed. BP Readings from Last 3 Encounters:  10/03/17 (!) 146/78  09/17/17 (!) 164/80  08/30/17 (!) 165/76    Review of Systems  Constitutional: Negative for fever, malaise/fatigue and weight loss.  HENT: Negative.  Negative for nosebleeds.   Eyes: Negative.  Negative for blurred vision, double vision and photophobia.  Respiratory: Negative.  Negative for cough and shortness of breath.   Cardiovascular: Negative.  Negative for chest pain, palpitations and leg swelling.  Gastrointestinal: Negative.  Negative for heartburn, nausea and vomiting.  Musculoskeletal: Negative.  Negative for myalgias.  Neurological: Positive for sensory change. Negative for dizziness, focal  weakness, seizures and headaches.  Psychiatric/Behavioral: Positive for depression. Negative for suicidal ideas. The patient is nervous/anxious.     Past Medical History:  Diagnosis Date  . Anxiety   . Bipolar 1 disorder ([place]   . Depression   . Diabetes mellitus without complication ([place]   . Gallstones   . Hyperlipidemia   . Hypertension   . Neuropathy   . Schizophrenia ([place]   . Seizures ([place]    X1- febrile seizure as a child-none since    Past Surgical History:  Procedure Laterality Date  . CESAREAN SECTION  05/1998   X 1  . CHOLECYSTECTOMY N/A 03/27/2016   Procedure: LAPAROSCOPIC CHOLECYSTECTOMY;  Surgeon: JOlean Ree MD;  Location: [facility] ORS;  Service: General;  Laterality: N/A;  . TONSILLECTOMY AND ADENOIDECTOMY     age 57 . TUBAL LIGATION      Family History  Problem Relation Age of Onset  . Diabetes Mother   . Heart disease Mother   . Kidney disease Mother   . Stroke Mother   . Hyperlipidemia Mother   . Diabetes Maternal Aunt     Social History Reviewed with  no changes to be made today.   Outpatient Medications Prior to Visit  Medication Sig Dispense Refill  . Blood Glucose Monitoring Suppl (TRUE METRIX METER) w/Device KIT 1 Device by Does not apply route 3 (three) times daily. 1 kit 0  . gabapentin (NEURONTIN) 300 MG capsule Take 1 capsule (300 mg total) by mouth 3 (three) times daily. (Patient taking differently: Take 300 mg by mouth once. ) 90 capsule 3  . Insulin Syringe-Needle U-100 31G X 5/16" 0.3 ML MISC 1 each by Does not apply route 4 (four) times daily -  before meals and at bedtime. 100 each 12  . lisinopril (PRINIVIL,ZESTRIL) 20 MG tablet Take 1 tablet (20 mg total) by mouth daily. 30 tablet 3  . QUEtiapine (SEROQUEL) 50 MG tablet Take 3 tablets (150 mg total) by mouth at bedtime. For mood control/depression 90 tablet 0  . TRUEPLUS LANCETS 28G MISC 1 each by Does not apply route 3 (three) times daily. 100 each 12  . venlafaxine XR (EFFEXOR XR)  75 MG 24 hr capsule Take 1 capsule (75 mg total) by mouth daily with breakfast. 60 capsule 3  . glipiZIDE (GLUCOTROL) 10 MG tablet Take 1 tablet (10 mg total) by mouth 2 (two) times daily before a meal. (Patient taking differently: Take 10 mg by mouth once. ) 60 tablet 3  . insulin glargine (LANTUS) 100 UNIT/ML injection Inject 0.15 mLs (15 Units total) into the skin at bedtime. 10 mL 3  . metFORMIN (GLUCOPHAGE) 1000 MG tablet Take 1 tablet (1,000 mg total) by mouth 2 (two) times daily. (Patient taking differently: Take 1,000 mg by mouth once. ) 60 tablet 3  . simvastatin (ZOCOR) 20 MG tablet Take 1 tablet (20 mg total) by mouth daily at 6 PM. 30 tablet 3  . busPIRone (BUSPAR) 7.5 MG tablet Take 1 tablet (7.5 mg total) by mouth 2 (two) times daily. Reported on 05/31/2015 (Patient not taking: Reported on 10/03/2017) 60 tablet 0   No facility-administered medications prior to visit.     No Known Allergies     Objective:    BP (!) 146/78 (BP Location: Left Arm, Patient Position: Sitting, Cuff Size: Normal)   Pulse 73   Temp 99.2 F (37.3 C) (Oral)   Ht _0  (1.575 m)   Wt 139 lb 8 oz (63.3 kg)   SpO2 98%   BMI 25.51 kg/m  Wt Readings from Last 3 Encounters:  10/03/17 139 lb 8 oz (63.3 kg)  07/08/17 141 lb (64 kg)  03/10/17 139 lb (63 kg)    Physical Exam  Constitutional: She is oriented to person, place, and time. She appears well-developed and well-nourished. She is cooperative.  HENT:  Head: Normocephalic and atraumatic.  Eyes: EOM are normal.  Neck: Normal range of motion.  Cardiovascular: Normal rate, regular rhythm and normal heart sounds. Exam reveals no gallop and no friction rub.  Pulmonary/Chest: Effort normal and breath sounds normal. No tachypnea. No respiratory distress. She has no decreased breath sounds. She has no wheezes. She has no rhonchi. She has no rales. She exhibits no tenderness.  Abdominal: Bowel sounds are normal.  Musculoskeletal: Normal range of motion.  She exhibits no edema.  Neurological: She is alert and oriented to person, place, and time. Coordination normal.  Skin: Skin is warm and dry.  Psychiatric: She has a normal mood and affect. Her behavior is normal. Judgment and thought content normal.  Nursing note and vitals reviewed.      Patient has  been counseled extensively about nutrition and exercise as well as the importance of adherence with medications and regular follow-up. The patient was given clear instructions to go to ER or return to medical center if symptoms don't improve, worsen or new problems develop. The patient verbalized understanding.   Follow-up: Return in about 3 months (around 01/03/2018) for DM.   Gildardo Pounds, FNP-BC [facility] [place] and [place], [place]   10/07/2017, 10:02 PM

## 2017-10-03 NOTE — Patient Instructions (Signed)
Coping With Diabetes Diabetes (type 1 diabetes mellitus or type 2 diabetes mellitus) is a condition in which the body does not have enough of a hormone called insulin, or the body does not respond properly to insulin. Normally, insulin allows sugars (glucose) to enter cells in the body. The cells use glucose for energy. With diabetes, extra glucose builds up in the blood instead of going into cells, which results in high blood glucose (hyperglycemia). How to manage lifestyle changes Managing diabetes includes medical treatments as well as lifestyle changes. If diabetes is not managed well, serious physical and emotional complications can occur. Taking good care of yourself means that you are responsible for:  Monitoring glucose regularly.  Eating a healthy diet.  Exercising regularly.  Meeting with health care providers.  Taking medicines as directed.  Some people may feel a lot of stress about managing their diabetes. This is known as emotional distress, and it is very common. Living with diabetes can place you at risk for emotional distress, depression, or anxiety. These disorders can be confusing and can make diabetes management more difficult. How to recognize stress Emotional distress Symptoms of emotional distress include:  Anger about having a diagnosis of diabetes.  Fear or frustration about your diagnosis and the changes you need to make to manage the condition.  Being overly worried about the care that you need or the cost of the care you need.  Feeling like you caused your condition by doing something wrong.  Fear of unpredictable situations, like low or high blood glucose.  Feeling judged by your health care providers.  Feeling very alone with the disease.  Getting too tired or "burned out" with the demands of daily care.  Depression Having diabetes means that you are at a higher risk for depression. Having depression also means that you are at a higher risk for  diabetes. Your health care provider may test (screen) you for symptoms of depression. It is important to recognize depression symptoms and to start treatment for it soon after it is diagnosed. The following are some symptoms of depression:  Loss of interest in things that you used to enjoy.  Trouble sleeping, or often waking up early and not being able to get back to sleep.  A change in appetite.  Feeling tired most of the day.  Feeling nervous and anxious.  Feeling guilty and worrying that you are a burden to others.  Feeling depressed more often than you do not feel that way.  Thoughts of hurting yourself or feeling that you want to die.  If you have any of these symptoms for 2 weeks or longer, reach out to a health care provider. Where to find support  Ask your health care provider to recommend a therapist who understands both depression and diabetes.  Search for information and support from the American Diabetes Association: www.diabetes.org  Find a certified diabetes educator and make an appointment through [place] of Diabetes Educators: www.diabeteseducator.org Follow these instructions at home: Managing emotional distress The following are some ways to manage emotional distress:  Talk with your health care provider or certified diabetes educator. Consider working with a counselor or therapist.  Learn as much as you can about diabetes and its treatment. Meet with a certified diabetes educator or take a class to learn how to manage your condition.  Keep a journal of your thoughts and concerns.  Accept that some things are out of your control.  Talk with other people who have diabetes. It  can help to talk with others about the emotional distress that you feel.  Find ways to manage stress that work for you. These may include art or music therapy, exercise, meditation, and hobbies.  Seek support from spiritual leaders, family, and friends.  General  instructions  Follow your diabetes management plan.  Keep all follow-up visits as told by your health care provider. This is important. Get help right away if:  You have thoughts about hurting yourself or others. If you ever feel like you may hurt yourself or others, or have thoughts about taking your own life, get help right away. You can go to your nearest emergency department or call:  Your local emergency services (911 in the [place]  A suicide crisis helpline, such as the [facility] at 2503124528. This is open 24 hours a day.  Summary  Diabetes (type 1 diabetes mellitus or type 2 diabetes mellitus) is a condition in which the body does not have enough of a hormone called insulin, or the body does not respond properly to insulin.  Living with diabetes puts you at risk for medical issues, and it also puts you at risk for emotional issues such as emotional distress, depression, and anxiety.  Recognizing the symptoms of emotional distress and depression may help you avoid problems with your diabetes control. It is important to start treatment for emotional distress and depression soon after they are diagnosed.  Having diabetes means that you are at a higher risk for depression. Ask your health care provider to recommend a therapist who understands both depression and diabetes.  If you experience symptoms of emotional distress or depression, it is important to discuss this with your health care provider, certified diabetes educator, or therapist. This information is not intended to replace advice given to you by your health care provider. Make sure you discuss any questions you have with your health care provider. Document Released: 06/13/2016 Document Revised: 06/13/2016 Document Reviewed: 06/13/2016 Elsevier Interactive Patient Education  2018 [employer].  Diabetes Mellitus and Nutrition When you have diabetes (diabetes mellitus), it is very important  to have healthy eating habits because your blood sugar (glucose) levels are greatly affected by what you eat and drink. Eating healthy foods in the appropriate amounts, at about the same times every day, can help you:  Control your blood glucose.  Lower your risk of heart disease.  Improve your blood pressure.  Reach or maintain a healthy weight.  Every person with diabetes is different, and each person has different needs for a meal plan. Your health care provider may recommend that you work with a diet and nutrition specialist (dietitian) to make a meal plan that is best for you. Your meal plan may vary depending on factors such as:  The calories you need.  The medicines you take.  Your weight.  Your blood glucose, blood pressure, and cholesterol levels.  Your activity level.  Other health conditions you have, such as heart or kidney disease.  How do carbohydrates affect me? Carbohydrates affect your blood glucose level more than any other type of food. Eating carbohydrates naturally increases the amount of glucose in your blood. Carbohydrate counting is a method for keeping track of how many carbohydrates you eat. Counting carbohydrates is important to keep your blood glucose at a healthy level, especially if you use insulin or take certain oral diabetes medicines. It is important to know how many carbohydrates you can safely have in each meal. This is different for  every person. Your dietitian can help you calculate how many carbohydrates you should have at each meal and for snack. Foods that contain carbohydrates include:  Bread, cereal, rice, pasta, and crackers.  Potatoes and corn.  Peas, beans, and lentils.  Milk and yogurt.  Fruit and juice.  Desserts, such as cakes, cookies, ice cream, and candy.  How does alcohol affect me? Alcohol can cause a sudden decrease in blood glucose (hypoglycemia), especially if you use insulin or take certain oral diabetes medicines.  Hypoglycemia can be a life-threatening condition. Symptoms of hypoglycemia (sleepiness, dizziness, and confusion) are similar to symptoms of having too much alcohol. If your health care provider says that alcohol is safe for you, follow these guidelines:  Limit alcohol intake to no more than 1 drink per day for nonpregnant women and 2 drinks per day for men. One drink equals 12 oz of beer, 5 oz of wine, or 1 oz of hard liquor.  Do not drink on an empty stomach.  Keep yourself hydrated with water, diet soda, or unsweetened iced tea.  Keep in mind that regular soda, juice, and other mixers may contain a lot of sugar and must be counted as carbohydrates.  What are tips for following this plan? Reading food labels  Start by checking the serving size on the label. The amount of calories, carbohydrates, fats, and other nutrients listed on the label are based on one serving of the food. Many foods contain more than one serving per package.  Check the total grams (g) of carbohydrates in one serving. You can calculate the number of servings of carbohydrates in one serving by dividing the total carbohydrates by 15. For example, if a food has 30 g of total carbohydrates, it would be equal to 2 servings of carbohydrates.  Check the number of grams (g) of saturated and trans fats in one serving. Choose foods that have low or no amount of these fats.  Check the number of milligrams (mg) of sodium in one serving. Most people should limit total sodium intake to less than 2,300 mg per day.  Always check the nutrition information of foods labeled as "low-fat" or "nonfat". These foods may be higher in added sugar or refined carbohydrates and should be avoided.  Talk to your dietitian to identify your daily goals for nutrients listed on the label. Shopping  Avoid buying canned, premade, or processed foods. These foods tend to be high in fat, sodium, and added sugar.  Shop around the outside edge of the  grocery store. This includes fresh fruits and vegetables, bulk grains, fresh meats, and fresh dairy. Cooking  Use low-heat cooking methods, such as baking, instead of high-heat cooking methods like deep frying.  Cook using healthy oils, such as olive, canola, or sunflower oil.  Avoid cooking with butter, cream, or high-fat meats. Meal planning  Eat meals and snacks regularly, preferably at the same times every day. Avoid going long periods of time without eating.  Eat foods high in fiber, such as fresh fruits, vegetables, beans, and whole grains. Talk to your dietitian about how many servings of carbohydrates you can eat at each meal.  Eat 4-6 ounces of lean protein each day, such as lean meat, chicken, fish, eggs, or tofu. 1 ounce is equal to 1 ounce of meat, chicken, or fish, 1 egg, or 1/4 cup of tofu.  Eat some foods each day that contain healthy fats, such as avocado, nuts, seeds, and fish. Lifestyle   Check your blood  glucose regularly.  Exercise at least 30 minutes 5 or more days each week, or as told by your health care provider.  Take medicines as told by your health care provider.  Do not use any products that contain nicotine or tobacco, such as cigarettes and e-cigarettes. If you need help quitting, ask your health care provider.  Work with a Social worker or diabetes educator to identify strategies to manage stress and any emotional and social challenges. What are some questions to ask my health care provider?  Do I need to meet with a diabetes educator?  Do I need to meet with a dietitian?  What number can I call if I have questions?  When are the best times to check my blood glucose? Where to find more information:  American Diabetes Association: diabetes.org/food-and-fitness/food  Academy of Nutrition and Dietetics: PokerClues.dk  [employer] of Diabetes and Digestive and Kidney Diseases (NIH):  ContactWire.be Summary  A healthy meal plan will help you control your blood glucose and maintain a healthy lifestyle.  Working with a diet and nutrition specialist (dietitian) can help you make a meal plan that is best for you.  Keep in mind that carbohydrates and alcohol have immediate effects on your blood glucose levels. It is important to count carbohydrates and to use alcohol carefully. This information is not intended to replace advice given to you by your health care provider. Make sure you discuss any questions you have with your health care provider. Document Released: 10/25/2004 Document Revised: 03/04/2016 Document Reviewed: 03/04/2016 Elsevier Interactive Patient Education  [employer].

## 2017-10-06 ENCOUNTER — Telehealth: Payer: Self-pay

## 2017-10-06 NOTE — Telephone Encounter (Signed)
-----   Message from Gildardo Pounds, NP sent at 10/03/2017  8:52 PM EDT ----- Urinalysis does not show any hematuria or blood

## 2017-10-06 NOTE — Telephone Encounter (Signed)
CMA attempt to call patient to inform on urine results.  No answer and unable to leave a VM due to no option was given.  If patient call back, please inform:  Urinalysis does not show any hematuria or blood

## 2017-10-07 ENCOUNTER — Encounter: Payer: Self-pay | Admitting: Nurse Practitioner

## 2017-12-31 ENCOUNTER — Ambulatory Visit: Payer: Self-pay | Admitting: Family Medicine

## 2018-03-02 ENCOUNTER — Emergency Department (HOSPITAL_COMMUNITY): Payer: Medicaid Other

## 2018-03-02 ENCOUNTER — Emergency Department (HOSPITAL_COMMUNITY)
Admission: EM | Admit: 2018-03-02 | Discharge: 2018-03-03 | Disposition: A | Discharge status: Other Acute Inpatient Hospital | Payer: Medicaid Other | Attending: Emergency Medicine | Admitting: Emergency Medicine

## 2018-03-02 ENCOUNTER — Encounter (HOSPITAL_COMMUNITY): Payer: Self-pay | Admitting: Emergency Medicine

## 2018-03-02 ENCOUNTER — Other Ambulatory Visit: Payer: Self-pay

## 2018-03-02 DIAGNOSIS — R45851 Suicidal ideations: Secondary | ICD-10-CM

## 2018-03-02 DIAGNOSIS — F1020 Alcohol dependence, uncomplicated: Secondary | ICD-10-CM | POA: Insufficient documentation

## 2018-03-02 DIAGNOSIS — N898 Other specified noninflammatory disorders of vagina: Secondary | ICD-10-CM | POA: Insufficient documentation

## 2018-03-02 DIAGNOSIS — Z87891 Personal history of nicotine dependence: Secondary | ICD-10-CM | POA: Insufficient documentation

## 2018-03-02 DIAGNOSIS — Z794 Long term (current) use of insulin: Secondary | ICD-10-CM | POA: Insufficient documentation

## 2018-03-02 DIAGNOSIS — F250 Schizoaffective disorder, bipolar type: Secondary | ICD-10-CM | POA: Diagnosis not present

## 2018-03-02 DIAGNOSIS — I10 Essential (primary) hypertension: Secondary | ICD-10-CM | POA: Diagnosis not present

## 2018-03-02 DIAGNOSIS — F1010 Alcohol abuse, uncomplicated: Secondary | ICD-10-CM

## 2018-03-02 DIAGNOSIS — R739 Hyperglycemia, unspecified: Secondary | ICD-10-CM

## 2018-03-02 DIAGNOSIS — R509 Fever, unspecified: Secondary | ICD-10-CM | POA: Diagnosis present

## 2018-03-02 DIAGNOSIS — E1165 Type 2 diabetes mellitus with hyperglycemia: Secondary | ICD-10-CM | POA: Insufficient documentation

## 2018-03-02 DIAGNOSIS — Z79899 Other long term (current) drug therapy: Secondary | ICD-10-CM | POA: Insufficient documentation

## 2018-03-02 DIAGNOSIS — F419 Anxiety disorder, unspecified: Secondary | ICD-10-CM | POA: Insufficient documentation

## 2018-03-02 LAB — CBG MONITORING, ED
GLUCOSE-CAPILLARY: 229 mg/dL — AB (ref 70–99)
GLUCOSE-CAPILLARY: 252 mg/dL — AB (ref 70–99)
Glucose-Capillary: 130 mg/dL — ABNORMAL HIGH (ref 70–99)
Glucose-Capillary: 127 mg/dL — ABNORMAL HIGH (ref 70–99)
Glucose-Capillary: 191 mg/dL — ABNORMAL HIGH (ref 70–99)
Glucose-Capillary: 545 mg/dL (ref 70–99)
Glucose-Capillary: 207 mg/dL — ABNORMAL HIGH (ref 70–99)

## 2018-03-02 LAB — RAPID URINE DRUG SCREEN, HOSP PERFORMED
AMPHETAMINES: NOT DETECTED
Barbiturates: NOT DETECTED
Benzodiazepines: NOT DETECTED
Cocaine: NOT DETECTED
Opiates: NOT DETECTED
Tetrahydrocannabinol: NOT DETECTED

## 2018-03-02 LAB — I-STAT BETA HCG BLOOD, ED (MC, WL, AP ONLY): I-stat hCG, quantitative: <5 m[IU]/mL (ref <5)

## 2018-03-02 LAB — BASIC METABOLIC PANEL
Anion gap: 10 (ref 5–15)
BUN: 7 mg/dL (ref 6–20)
CO2: 27 mmol/L (ref 22–32)
Calcium: 9.2 mg/dL (ref 8.9–10.3)
Chloride: 93 mmol/L — ABNORMAL LOW (ref 98–111)
Creatinine, Ser: 1.01 mg/dL — ABNORMAL HIGH (ref 0.44–1.00)
Glucose, Bld: 587 mg/dL (ref 70–99)
Potassium: 3.9 mmol/L (ref 3.5–5.1)
Sodium: 130 mmol/L — ABNORMAL LOW (ref 135–145)

## 2018-03-02 LAB — URINALYSIS, ROUTINE W REFLEX MICROSCOPIC
Bacteria, UA: NONE SEEN
Bilirubin Urine: NEGATIVE
Glucose, UA: >=500 mg/dL — AB
Ketones, ur: NEGATIVE mg/dL
LEUKOCYTES UA: NEGATIVE
NITRITE: NEGATIVE
Protein, ur: 30 mg/dL — AB
Specific Gravity, Urine: 1.029 (ref 1.005–1.030)
pH: 5 (ref 5.0–8.0)

## 2018-03-02 LAB — CBC
HCT: 40.4 % (ref 36.0–46.0)
Hemoglobin: 12.9 g/dL (ref 12.0–15.0)
MCH: 27.8 pg (ref 26.0–34.0)
MCHC: 31.9 g/dL (ref 30.0–36.0)
MCV: 87.1 fL (ref 80.0–100.0)
NRBC: 0 % (ref 0.0–0.2)
Platelets: 312 10*3/uL (ref 150–400)
RBC: 4.64 MIL/uL (ref 3.87–5.11)
RDW: 12.7 % (ref 11.5–15.5)
WBC: 5.7 10*3/uL (ref 4.0–10.5)

## 2018-03-02 LAB — ETHANOL: Alcohol, Ethyl (B): <10 mg/dL (ref <10)

## 2018-03-02 MED ORDER — FLUCONAZOLE 150 MG PO TABS
150.0000 mg | ORAL_TABLET | Freq: Once | ORAL | Status: AC
  Administered 2018-03-02: 150 mg via ORAL
  Filled 2018-03-02: qty 1

## 2018-03-02 MED ORDER — INSULIN ASPART 100 UNIT/ML ~~LOC~~ SOLN
10.0000 [IU] | Freq: Once | SUBCUTANEOUS | Status: AC
  Administered 2018-03-02: 10 [IU] via INTRAVENOUS

## 2018-03-02 MED ORDER — LISINOPRIL 20 MG PO TABS
20.0000 mg | ORAL_TABLET | Freq: Every day | ORAL | Status: DC
  Administered 2018-03-02 – 2018-03-03 (×2): 20 mg via ORAL
  Filled 2018-03-02 (×2): qty 1

## 2018-03-02 MED ORDER — BUSPIRONE HCL 15 MG PO TABS
7.5000 mg | ORAL_TABLET | Freq: Two times a day (BID) | ORAL | Status: DC
  Administered 2018-03-02 – 2018-03-03 (×4): 7.5 mg via ORAL
  Filled 2018-03-02 (×4): qty 1

## 2018-03-02 MED ORDER — SODIUM CHLORIDE 0.9 % IV BOLUS (SEPSIS)
1000.0000 mL | Freq: Once | INTRAVENOUS | Status: AC
  Administered 2018-03-02: 1000 mL via INTRAVENOUS

## 2018-03-02 MED ORDER — GUAIFENESIN 100 MG/5ML PO SOLN
10.0000 mL | ORAL | Status: DC | PRN
  Filled 2018-03-02: qty 10

## 2018-03-02 MED ORDER — QUETIAPINE FUMARATE 50 MG PO TABS
150.0000 mg | ORAL_TABLET | Freq: Every day | ORAL | Status: DC
  Administered 2018-03-02 (×2): 150 mg via ORAL
  Filled 2018-03-02 (×2): qty 1

## 2018-03-02 MED ORDER — VENLAFAXINE HCL ER 75 MG PO CP24
75.0000 mg | ORAL_CAPSULE | Freq: Every day | ORAL | Status: DC
  Administered 2018-03-02 – 2018-03-03 (×2): 75 mg via ORAL
  Filled 2018-03-02 (×2): qty 1

## 2018-03-02 MED ORDER — LOPERAMIDE HCL 2 MG PO CAPS
2.0000 mg | ORAL_CAPSULE | Freq: Once | ORAL | Status: AC
  Administered 2018-03-02: 2 mg via ORAL
  Filled 2018-03-02: qty 1

## 2018-03-02 MED ORDER — GLIPIZIDE 10 MG PO TABS
10.0000 mg | ORAL_TABLET | Freq: Two times a day (BID) | ORAL | Status: DC
  Administered 2018-03-02 – 2018-03-03 (×4): 10 mg via ORAL
  Filled 2018-03-02 (×5): qty 1

## 2018-03-02 MED ORDER — METFORMIN HCL 500 MG PO TABS
1000.0000 mg | ORAL_TABLET | Freq: Two times a day (BID) | ORAL | Status: DC
  Administered 2018-03-02 – 2018-03-03 (×4): 1000 mg via ORAL
  Filled 2018-03-02 (×4): qty 2

## 2018-03-02 MED ORDER — INSULIN GLARGINE 100 UNIT/ML ~~LOC~~ SOLN
15.0000 [IU] | Freq: Every day | SUBCUTANEOUS | Status: DC
  Administered 2018-03-02 (×2): 15 [IU] via SUBCUTANEOUS
  Filled 2018-03-02 (×3): qty 0.15

## 2018-03-02 NOTE — Progress Notes (Signed)
Pt. meets criteria for inpatient treatment per Lindon Romp, NP.  Referred out to the following hospitals: [facility]  [facility]  [facility]     Disposition CSW will continue to follow for placement.  Areatha Keas. Judi Cong, MSW, [place] [facility] Clinical Social Work [phone] (cell) [phone] (office)

## 2018-03-02 NOTE — ED Notes (Signed)
Pt wonded by security.

## 2018-03-02 NOTE — ED Notes (Signed)
Took 20 G out of pt left hand b/c it did not work.

## 2018-03-02 NOTE — ED Notes (Signed)
Patient transported to X-ray 

## 2018-03-02 NOTE — ED Notes (Signed)
Pt given apple juice per provider who is bedside

## 2018-03-02 NOTE — ED Notes (Addendum)
Pt changed into scrubs, belongings removed from room and given to [place], Therapist, sports. Pt updated with psych process and calm and complaint at this time.

## 2018-03-02 NOTE — ED Notes (Signed)
Belongings inventoried and placed in locker #6-Monique,RN

## 2018-03-02 NOTE — ED Notes (Signed)
Pt c/o of diarrhea x 1 and requested med; EDP notified-Monique,RN

## 2018-03-02 NOTE — ED Notes (Signed)
Delay in lab draw,  Pt currently in bathroom.

## 2018-03-02 NOTE — ED Triage Notes (Signed)
BIB EMS from home. Pt presents with multiple complaints. Has been out of her meds X1 yr, reports hyperglycemia, hypertension, gen weakness, possible yeast infection, etc.

## 2018-03-02 NOTE — ED Notes (Signed)
Pt CBG was 545, notified Tori(RN)
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
## 2018-03-02 NOTE — ED Notes (Signed)
TTS begun

## 2018-03-02 NOTE — ED Notes (Addendum)
Went in to meet pt, pt refused to change into hospital gown.  Blanket given

## 2018-03-03 ENCOUNTER — Encounter (HOSPITAL_COMMUNITY): Payer: Self-pay | Admitting: Registered Nurse

## 2018-03-03 ENCOUNTER — Encounter (HOSPITAL_COMMUNITY): Payer: Self-pay | Admitting: *Deleted

## 2018-03-03 ENCOUNTER — Inpatient Hospital Stay (HOSPITAL_COMMUNITY)
Admission: AD | Admit: 2018-03-03 | Discharge: 2018-03-09 | DRG: 885 | Disposition: A | Discharge status: Home / Self Care | Payer: Medicaid Other | Source: Intra-hospital | Attending: Psychiatry | Admitting: Psychiatry

## 2018-03-03 ENCOUNTER — Other Ambulatory Visit: Payer: Self-pay

## 2018-03-03 DIAGNOSIS — Z87891 Personal history of nicotine dependence: Secondary | ICD-10-CM | POA: Diagnosis not present

## 2018-03-03 DIAGNOSIS — Z8349 Family history of other endocrine, nutritional and metabolic diseases: Secondary | ICD-10-CM

## 2018-03-03 DIAGNOSIS — I10 Essential (primary) hypertension: Secondary | ICD-10-CM | POA: Diagnosis present

## 2018-03-03 DIAGNOSIS — Z79899 Other long term (current) drug therapy: Secondary | ICD-10-CM

## 2018-03-03 DIAGNOSIS — F251 Schizoaffective disorder, depressive type: Principal | ICD-10-CM | POA: Diagnosis present

## 2018-03-03 DIAGNOSIS — R739 Hyperglycemia, unspecified: Secondary | ICD-10-CM

## 2018-03-03 DIAGNOSIS — Z794 Long term (current) use of insulin: Secondary | ICD-10-CM

## 2018-03-03 DIAGNOSIS — G4700 Insomnia, unspecified: Secondary | ICD-10-CM | POA: Diagnosis present

## 2018-03-03 DIAGNOSIS — E119 Type 2 diabetes mellitus without complications: Secondary | ICD-10-CM | POA: Diagnosis present

## 2018-03-03 DIAGNOSIS — F250 Schizoaffective disorder, bipolar type: Secondary | ICD-10-CM | POA: Diagnosis present

## 2018-03-03 DIAGNOSIS — E1165 Type 2 diabetes mellitus with hyperglycemia: Secondary | ICD-10-CM

## 2018-03-03 DIAGNOSIS — E0800 Diabetes mellitus due to underlying condition with hyperosmolarity without nonketotic hyperglycemic-hyperosmolar coma (NKHHC): Secondary | ICD-10-CM

## 2018-03-03 DIAGNOSIS — F322 Major depressive disorder, single episode, severe without psychotic features: Secondary | ICD-10-CM | POA: Diagnosis present

## 2018-03-03 DIAGNOSIS — Z8249 Family history of ischemic heart disease and other diseases of the circulatory system: Secondary | ICD-10-CM | POA: Diagnosis not present

## 2018-03-03 DIAGNOSIS — Z596 Low income: Secondary | ICD-10-CM

## 2018-03-03 DIAGNOSIS — Z915 Personal history of self-harm: Secondary | ICD-10-CM | POA: Diagnosis not present

## 2018-03-03 DIAGNOSIS — Z91410 Personal history of adult physical and sexual abuse: Secondary | ICD-10-CM | POA: Diagnosis not present

## 2018-03-03 DIAGNOSIS — F259 Schizoaffective disorder, unspecified: Secondary | ICD-10-CM | POA: Diagnosis not present

## 2018-03-03 DIAGNOSIS — Z833 Family history of diabetes mellitus: Secondary | ICD-10-CM

## 2018-03-03 DIAGNOSIS — Z9049 Acquired absence of other specified parts of digestive tract: Secondary | ICD-10-CM | POA: Diagnosis not present

## 2018-03-03 DIAGNOSIS — Z823 Family history of stroke: Secondary | ICD-10-CM | POA: Diagnosis not present

## 2018-03-03 DIAGNOSIS — F1010 Alcohol abuse, uncomplicated: Secondary | ICD-10-CM | POA: Diagnosis present

## 2018-03-03 DIAGNOSIS — R45851 Suicidal ideations: Secondary | ICD-10-CM | POA: Diagnosis present

## 2018-03-03 DIAGNOSIS — F419 Anxiety disorder, unspecified: Secondary | ICD-10-CM | POA: Diagnosis present

## 2018-03-03 DIAGNOSIS — Z841 Family history of disorders of kidney and ureter: Secondary | ICD-10-CM

## 2018-03-03 DIAGNOSIS — R5383 Other fatigue: Secondary | ICD-10-CM

## 2018-03-03 DIAGNOSIS — F332 Major depressive disorder, recurrent severe without psychotic features: Secondary | ICD-10-CM | POA: Diagnosis present

## 2018-03-03 LAB — GLUCOSE, CAPILLARY
Glucose-Capillary: 173 mg/dL — ABNORMAL HIGH (ref 70–99)
Glucose-Capillary: 224 mg/dL — ABNORMAL HIGH (ref 70–99)

## 2018-03-03 LAB — CBG MONITORING, ED: Glucose-Capillary: 89 mg/dL (ref 70–99)

## 2018-03-03 MED ORDER — LISINOPRIL 20 MG PO TABS
20.0000 mg | ORAL_TABLET | Freq: Every day | ORAL | Status: DC
  Filled 2018-03-03 (×4): qty 1

## 2018-03-03 MED ORDER — VENLAFAXINE HCL ER 75 MG PO CP24
75.0000 mg | ORAL_CAPSULE | Freq: Every day | ORAL | Status: DC
  Administered 2018-03-04 – 2018-03-08 (×5): 75 mg via ORAL
  Filled 2018-03-03 (×9): qty 1

## 2018-03-03 MED ORDER — QUETIAPINE FUMARATE 50 MG PO TABS
150.0000 mg | ORAL_TABLET | Freq: Every day | ORAL | Status: DC
  Administered 2018-03-03: 150 mg via ORAL
  Filled 2018-03-03 (×4): qty 1

## 2018-03-03 MED ORDER — GLIPIZIDE 5 MG PO TABS
10.0000 mg | ORAL_TABLET | Freq: Two times a day (BID) | ORAL | Status: DC
  Administered 2018-03-03 – 2018-03-09 (×12): 10 mg via ORAL
  Filled 2018-03-03: qty 1
  Filled 2018-03-03: qty 2
  Filled 2018-03-03: qty 1
  Filled 2018-03-03: qty 2
  Filled 2018-03-03: qty 1
  Filled 2018-03-03: qty 2
  Filled 2018-03-03 (×7): qty 1
  Filled 2018-03-03: qty 2
  Filled 2018-03-03: qty 1
  Filled 2018-03-03 (×2): qty 2
  Filled 2018-03-03 (×3): qty 1

## 2018-03-03 MED ORDER — INFLUENZA VAC SPLIT QUAD 0.5 ML IM SUSY
0.5000 mL | PREFILLED_SYRINGE | INTRAMUSCULAR | Status: DC
  Filled 2018-03-03: qty 0.5

## 2018-03-03 MED ORDER — ENSURE ENLIVE PO LIQD
237.0000 mL | Freq: Two times a day (BID) | ORAL | Status: DC
  Administered 2018-03-05 – 2018-03-09 (×4): 237 mL via ORAL

## 2018-03-03 MED ORDER — GUAIFENESIN 100 MG/5ML PO SOLN: 10.0000 mL | ORAL | Status: DC | PRN

## 2018-03-03 MED ORDER — BUSPIRONE HCL 7.5 MG PO TABS
7.5000 mg | ORAL_TABLET | Freq: Two times a day (BID) | ORAL | Status: DC
  Administered 2018-03-03 – 2018-03-04 (×3): 7.5 mg via ORAL
  Filled 2018-03-03 (×6): qty 1

## 2018-03-03 MED ORDER — METFORMIN HCL 500 MG PO TABS
1000.0000 mg | ORAL_TABLET | Freq: Two times a day (BID) | ORAL | Status: DC
  Administered 2018-03-03 – 2018-03-08 (×11): 1000 mg via ORAL
  Filled 2018-03-03 (×19): qty 2

## 2018-03-03 MED ORDER — INSULIN GLARGINE 100 UNIT/ML ~~LOC~~ SOLN
15.0000 [IU] | Freq: Every day | SUBCUTANEOUS | Status: DC
  Administered 2018-03-03: 15 [IU] via SUBCUTANEOUS

## 2018-03-03 NOTE — Progress Notes (Signed)
Lorraine Turner is a 58 year old female pt admitted on voluntary basis. On admission, she reports that she has been feeling depressed and suicidal and spoke about how her home is getting ready to be foreclosed on. She reports that she has not had her medications because she cannot afford them and spoke about how if she is put back on her medications while here she does not have the money to keep taking them when she leaves. She reports that she was getting a pension from her husband's work and reports that she won't be entitled to it fully until she turns 32. She denies any drug use but does endorse alcohol usage and reports that she only drinks on the weekends. She does endorse passive SI currently but is able to contract for safety while in the hospital. She reports that she will return to her same living situation upon discharge and reports that her daughter is currently living with her. Lorraine Turner was escorted to the unit, oriented to the milieu and safety maintained.

## 2018-03-03 NOTE — ED Provider Notes (Signed)
Patient is a 58 year old female resting comfortably in bed. No acute changes overnight. Patient meets inpatient criteria per Lindon Romp, NP. CSW will continue to follow for placement.   Today's Vitals   03/02/18 1413 03/02/18 1630 03/02/18 2237 03/03/18 0643  BP: 139/77  (!) 159/75 (!) 107/48  Pulse: 68  73 68  Resp: 14  18 16   Temp: 98.2 F (36.8 C)  99 F (37.2 C) 98.2 F (36.8 C)  TempSrc: Oral  Oral Oral  SpO2: 99%  96% 97%  Weight:      Height:      PainSc:  0-No pain 0-No pain    Body mass index is 24.69 kg/m.    Arville Lime, [place] 03/03/18 [place], [place], DO 03/03/18 1411

## 2018-03-03 NOTE — ED Notes (Signed)
Took patient saline lock out patient is resting with sitter at bedside

## 2018-03-03 NOTE — Progress Notes (Signed)
The patient thanked God for being alive and because she is receiving treatment from the hospital. She expressed her gratitude for her peers that have been supportive of her. Her goal for tomorrow is to "stay focused" and to set more future goals.

## 2018-03-03 NOTE — ED Notes (Signed)
ED Provider at bedside. 

## 2018-03-03 NOTE — ED Notes (Signed)
Patient belongings and valuables given to [place], from [place]

## 2018-03-03 NOTE — Tx Team (Signed)
Initial Treatment Plan 03/03/2018 6:15 PM Lorraine Turner QKS:[id number]    PATIENT STRESSORS: Financial difficulties Health problems Medication change or noncompliance   PATIENT STRENGTHS: Ability for insight Average or above average intelligence Capable of independent living General fund of knowledge Motivation for treatment/growth   PATIENT IDENTIFIED PROBLEMS: Depression Suicidal thoughts "I need to de-stress myself" "If you put me on medicine here I won't be able to afford it"                     DISCHARGE CRITERIA:  Ability to meet basic life and health needs Improved stabilization in mood, thinking, and/or behavior Reduction of life-threatening or endangering symptoms to within safe limits Verbal commitment to aftercare and medication compliance  PRELIMINARY DISCHARGE PLAN: Attend aftercare/continuing care group Return to previous living arrangement  PATIENT/FAMILY INVOLVEMENT: This treatment plan has been presented to and reviewed with the patient, Lorraine Turner, and/or family member, .  The patient and family have been given the opportunity to ask questions and make suggestions.  [place], [place], [place] 03/03/2018, 6:15 PM

## 2018-03-03 NOTE — ED Notes (Signed)
Arranged trasport via [facility]

## 2018-03-03 NOTE — Progress Notes (Signed)
Pt accepted to [facility], Bed 303-1 Lorraine Rankin, NP is the accepting provider.  Lorraine Turner is the attending provider.  Call report to [phone]  [email or web address] notified.   Pt is Voluntary.  Pt may be transported by [facility]  Pt scheduled to arrive at [facility]@1500   [name] [employer], MSW, [place] Disposition Clinical Social Work [phone] (cell) [phone] (office)

## 2018-03-04 DIAGNOSIS — F322 Major depressive disorder, single episode, severe without psychotic features: Secondary | ICD-10-CM

## 2018-03-04 LAB — GLUCOSE, CAPILLARY
Glucose-Capillary: 231 mg/dL — ABNORMAL HIGH (ref 70–99)
Glucose-Capillary: 122 mg/dL — ABNORMAL HIGH (ref 70–99)
Glucose-Capillary: 158 mg/dL — ABNORMAL HIGH (ref 70–99)
Glucose-Capillary: 99 mg/dL (ref 70–99)

## 2018-03-04 MED ORDER — LISINOPRIL 10 MG PO TABS
10.0000 mg | ORAL_TABLET | Freq: Every day | ORAL | Status: DC
  Administered 2018-03-05 – 2018-03-07 (×3): 10 mg via ORAL
  Filled 2018-03-04 (×7): qty 1

## 2018-03-04 MED ORDER — BUSPIRONE HCL 5 MG PO TABS
5.0000 mg | ORAL_TABLET | Freq: Two times a day (BID) | ORAL | Status: DC
  Administered 2018-03-05 – 2018-03-08 (×8): 5 mg via ORAL
  Filled 2018-03-04 (×14): qty 1

## 2018-03-04 MED ORDER — QUETIAPINE FUMARATE 100 MG PO TABS
100.0000 mg | ORAL_TABLET | Freq: Every day | ORAL | Status: DC
  Administered 2018-03-04 – 2018-03-08 (×3): 100 mg via ORAL
  Filled 2018-03-04 (×7): qty 1

## 2018-03-04 NOTE — [facility] Suicide Risk Assessment (Signed)
[facility] INPATIENT:  Family/Significant Other Suicide Prevention Education  Suicide Prevention Education:  Patient Refusal for Family/Significant Other Suicide Prevention Education: The patient Lorraine Turner has refused to provide written consent for family/significant other to be provided Family/Significant Other Suicide Prevention Education during admission and/or prior to discharge.  Physician notified.  SPE completed with pt, as pt refused to consent to family contact. SPI pamphlet provided to pt and pt was encouraged to share information with support network, ask questions, and talk about any concerns relating to SPE. Pt denies access to guns/firearms and verbalized understanding of information provided. Mobile Crisis information also provided to pt.    Mariann Laster Yamira Papa 03/04/2018, 2:43 PM

## 2018-03-04 NOTE — Therapy (Signed)
Occupational Therapy Group Note  Date:  03/04/2018 Time:  1:14 PM  Group Topic/Focus:  Stress Management  Participation Level:  Active  Participation Quality:  Appropriate  Affect:  Defensive  Cognitive:  Appropriate  Insight: Improving  Engagement in Group:  Engaged  Modes of Intervention:  Activity, Discussion, Education and Socialization  Additional Comments:    S: "Music really helps me cope"  O: Education given on stress management and healthy coping skills. Music provided for pts to choose to help increase relaxation during group. Pt asked to explain the difference between negative and positive coping skills. Encouraged to discuss within group members to increase skill set.   A: Pt presents to group with blunted affect, engaged and participatory. Pt shares that her main negative coping skill is substance use, and she uses music to cope more effectively. Pt very connected with other group members in regard to music- and sharing positive memories associated with her song choices.  P: OT group will be once a week while pt inpatient.  Zenovia Jarred, MSOT, OTR/L Behavioral Health OT/ Acute Relief OT PHP Office: [place] 03/04/2018, 1:14 PM

## 2018-03-04 NOTE — Progress Notes (Signed)
NUTRITION ASSESSMENT  Pt identified as at risk on the Malnutrition Screen Tool  INTERVENTION: - Continue Ensure Enlive BID, each supplement provides 350 kcal and 20 grams of protein. - Continue to encourage PO intakes.   NUTRITION DIAGNOSIS: Unintentional weight loss related to sub-optimal intake as evidenced by pt report.   Goal: Pt to meet >/= 90% of their estimated nutrition needs.  Monitor:  PO intake  Assessment:  Patient admitted for SI and depression. A current stressor for her is that her house is pending foreclosure. Patient reported being non-compliant with medications, partly d/t inability to afford them. She has hx of DM and also reports not being compliant with carb controlled diet.   Per chart review, current weight is 130 lb and weight on 10/03/17 was 139 lb. This indicates 9 lb weight loss (6.5% body weight) in the past 5 months; not significant for time frame.   Ensure was ordered per ONS protocol at the time of admission. Continue this item.     58 y.o. female  Height: Ht Readings from Last 1 Encounters:  03/03/18 5\' 2"  (1.575 m)    Weight: Wt Readings from Last 1 Encounters:  03/03/18 59 kg    Weight Hx: Wt Readings from Last 10 Encounters:  03/03/18 59 kg  03/02/18 61.2 kg  10/03/17 63.3 kg  07/08/17 64 kg  03/10/17 63 kg  01/21/17 61.2 kg  10/07/16 57.6 kg  09/23/16 58.8 kg  06/17/16 58.1 kg  04/17/16 56.4 kg    BMI:  Body mass index is 23.78 kg/m. Pt meets criteria for normal weight based on current BMI.  Estimated Nutritional Needs: Kcal: 25-30 kcal/kg Protein: > 1 gram protein/kg Fluid: 1 ml/kcal  Diet Order:  Diet Order            Diet Carb Modified Fluid consistency: Thin; Room service appropriate? Yes  Diet effective now             Pt is also offered choice of unit snacks mid-morning and mid-afternoon.  Pt is eating as desired.   Lab results and medications reviewed.      Jarome Matin, MS, RD, LDN,  [facility] Inpatient Clinical Dietitian Pager # [phone] After hours/weekend pager # [phone]
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
## 2018-03-04 NOTE — Progress Notes (Signed)
Patient ID: Lorraine Turner, female   DOB: 16-Mar-1960, 58 y.o.   MRN: [id number]  Pt currently presents with a flat affect and anxious behavior. Pt forwards little to writer this morning. Requests to speak with MD. Pt states "I'm doing okay." Pt reports poor sleep with current medication regimen.   Pt provided with medications per providers orders. Pt's labs and vitals were monitored throughout the night. Pt supported emotionally and encouraged to express concerns and questions. Pt educated on medications.  Pt's safety ensured with 15 minute and environmental checks. Pt currently denies SI/HI and A/V hallucinations. Pt verbally agrees to seek staff if SI/HI or A/VH occurs and to consult with staff before acting on any harmful thoughts. Will continue POC.

## 2018-03-04 NOTE — [facility] Counselor (Signed)
CSW attempted to complete psychosocial assessment with pt; pt sleeping heavily and unable to be roused. CSW will attempt again this afternoon.  Evalina Field, MSW, LCSW Clinical Social Work 03/04/2018 10:07 AM

## 2018-03-04 NOTE — Tx Team (Signed)
Interdisciplinary Treatment and Diagnostic Plan Update  03/04/2018 Time of Session: 9:00am Lorraine Turner MRN: [id number]  Principal Diagnosis: <principal problem not specified>  Secondary Diagnoses: Active Problems:   MDD (major depressive disorder), severe (HCC)   Current Medications:  Current Facility-Administered Medications  Medication Dose Route Frequency Provider Last Rate Last Dose  . busPIRone (BUSPAR) tablet 7.5 mg  7.5 mg Oral BID Rankin, Shuvon B, NP   7.5 mg at 03/03/18 1719  . feeding supplement (ENSURE ENLIVE) (ENSURE ENLIVE) liquid 237 mL  237 mL Oral BID BM Cobos, Fernando A, MD      . glipiZIDE (GLUCOTROL) tablet 10 mg  10 mg Oral BID AC Rankin, Shuvon B, NP   10 mg at 03/04/18 7353  . guaiFENesin (ROBITUSSIN) 100 MG/5ML solution 200 mg  10 mL Oral Q4H PRN Rankin, Shuvon B, NP      . Influenza vac split quadrivalent PF (FLUARIX) injection 0.5 mL  0.5 mL Intramuscular Tomorrow-1000 Cobos, Fernando A, MD      . insulin glargine (LANTUS) injection 15 Units  15 Units Subcutaneous QHS Rankin, Shuvon B, NP   15 Units at 03/03/18 2246  . lisinopril (PRINIVIL,ZESTRIL) tablet 20 mg  20 mg Oral Daily Rankin, Shuvon B, NP      . metFORMIN (GLUCOPHAGE) tablet 1,000 mg  1,000 mg Oral BID Rankin, Shuvon B, NP   1,000 mg at 03/03/18 1719  . QUEtiapine (SEROQUEL) tablet 150 mg  150 mg Oral QHS Rankin, Shuvon B, NP   150 mg at 03/03/18 2246  . venlafaxine XR (EFFEXOR-XR) 24 hr capsule 75 mg  75 mg Oral Q breakfast Rankin, Shuvon B, NP       PTA Medications: Medications Prior to Admission  Medication Sig Dispense Refill Last Dose  . Blood Glucose Monitoring Suppl (TRUE METRIX METER) w/Device KIT 1 Device by Does not apply route 3 (three) times daily. (Patient not taking: Reported on 03/02/2018) 1 kit 0 Not Taking at Unknown time  . Insulin Syringe-Needle U-100 31G X 5/16" 0.3 ML MISC 1 each by Does not apply route 4 (four) times daily -  before meals and at bedtime. (Patient not  taking: Reported on 03/02/2018) 100 each 12 Not Taking at Unknown time  . TRUEPLUS LANCETS 28G MISC 1 each by Does not apply route 3 (three) times daily. (Patient not taking: Reported on 03/02/2018) 100 each 12 Not Taking at Unknown time    Patient Stressors: Financial difficulties Health problems Medication change or noncompliance  Patient Strengths: Ability for insight Average or above average intelligence Capable of independent living General fund of knowledge Motivation for treatment/growth  Treatment Modalities: Medication Management, Group therapy, Case management,  1 to 1 session with clinician, Psychoeducation, Recreational therapy.   Physician Treatment Plan for Primary Diagnosis: <principal problem not specified> Long Term Goal(s):     Short Term Goals:    Medication Management: Evaluate patient's response, side effects, and tolerance of medication regimen.  Therapeutic Interventions: 1 to 1 sessions, Unit Group sessions and Medication administration.  Evaluation of Outcomes: Not Met  Physician Treatment Plan for Secondary Diagnosis: Active Problems:   MDD (major depressive disorder), severe (Independence)  Long Term Goal(s):     Short Term Goals:       Medication Management: Evaluate patient's response, side effects, and tolerance of medication regimen.  Therapeutic Interventions: 1 to 1 sessions, Unit Group sessions and Medication administration.  Evaluation of Outcomes: Not Met   RN Treatment Plan for Primary Diagnosis: <principal problem not  specified> Long Term Goal(s): Knowledge of disease and therapeutic regimen to maintain health will improve  Short Term Goals: Ability to remain free from injury will improve, Ability to verbalize feelings will improve, Ability to disclose and discuss suicidal ideas, Ability to identify and develop effective coping behaviors will improve and Compliance with prescribed medications will improve  Medication Management: RN will  administer medications as ordered by provider, will assess and evaluate patient's response and provide education to patient for prescribed medication. RN will report any adverse and/or side effects to prescribing provider.  Therapeutic Interventions: 1 on 1 counseling sessions, Psychoeducation, Medication administration, Evaluate responses to treatment, Monitor vital signs and CBGs as ordered, Perform/monitor CIWA, COWS, AIMS and Fall Risk screenings as ordered, Perform wound care treatments as ordered.  Evaluation of Outcomes: Not Met   LCSW Treatment Plan for Primary Diagnosis: <principal problem not specified> Long Term Goal(s): Safe transition to appropriate next level of care at discharge, Engage patient in therapeutic group addressing interpersonal concerns.  Short Term Goals: Engage patient in aftercare planning with referrals and resources, Increase social support, Increase emotional regulation, Identify triggers associated with mental health/substance abuse issues and Increase skills for wellness and recovery  Therapeutic Interventions: Assess for all discharge needs, 1 to 1 time with Social worker, Explore available resources and support systems, Assess for adequacy in community support network, Educate family and significant other(s) on suicide prevention, Complete Psychosocial Assessment, Interpersonal group therapy.  Evaluation of Outcomes: Not Met   Progress in Treatment: Attending groups: No. New to unit. Participating in groups: No. Taking medication as prescribed: Yes. Toleration medication: Yes. Family/Significant other contact made: No, will contact:  supports if consent is granted. Patient understands diagnosis: Yes. Discussing patient identified problems/goals with staff: Yes. Medical problems stabilized or resolved: Yes. Denies suicidal/homicidal ideation: No. Issues/concerns per patient self-inventory: No.   New problem(s) identified: No, Describe:  CSW continuing  to assess.  New Short Term/Long Term Goal(s): detox, medication management for mood stabilization; elimination of SI thoughts; development of comprehensive mental wellness/sobriety plan.  Patient Goals: "I need to de-stress myself." Manage depressive symptoms and reduce SI  Discharge Plan or Barriers: Continuing to assess. [place] pamphlet, Mobile Crisis information, and AA/NA information provided to patient for additional community support and resources.   Reason for Continuation of Hospitalization: Anxiety Depression Suicidal ideation Withdrawal symptoms  Estimated Length of Stay: 3-5 days  Attendees: Patient:  03/04/2018 8:40 AM  Physician:  03/04/2018 8:40 AM  Nursing:  03/04/2018 8:40 AM  RN Care Manager: 03/04/2018 8:40 AM  Social Worker: Stephanie Acre, [place] 03/04/2018 8:40 AM  Recreational Therapist:  03/04/2018 8:40 AM  Other:  03/04/2018 8:40 AM  Other:  03/04/2018 8:40 AM  Other: 03/04/2018 8:40 AM    Scribe for Treatment Team: Joellen Jersey, [place] 03/04/2018 8:40 AM
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
## 2018-03-04 NOTE — Progress Notes (Signed)
D   Pt is pleasant on approach   She can be loud and has loose boundaries at times    She administered her own insulin and is familiar with her illness but reports a tendency to be non compliant with her diet and medication A   Verbal support given   Educate on diet and medication compliance    Medications administered and effectiveness monitored   Q 15 min checks    R   Pt is safe and said she would try to eat a healthier diet
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
## 2018-03-05 DIAGNOSIS — F259 Schizoaffective disorder, unspecified: Secondary | ICD-10-CM

## 2018-03-05 DIAGNOSIS — F419 Anxiety disorder, unspecified: Secondary | ICD-10-CM

## 2018-03-05 LAB — BASIC METABOLIC PANEL
Anion gap: 7 (ref 5–15)
BUN: 17 mg/dL (ref 6–20)
CO2: 29 mmol/L (ref 22–32)
Calcium: 9.6 mg/dL (ref 8.9–10.3)
Chloride: 102 mmol/L (ref 98–111)
Creatinine, Ser: 0.79 mg/dL (ref 0.44–1.00)
GFR calc Af Amer: >60 mL/min (ref >60)
GFR calc non Af Amer: >60 mL/min (ref >60)
Glucose, Bld: 108 mg/dL — ABNORMAL HIGH (ref 70–99)
Potassium: 3.5 mmol/L (ref 3.5–5.1)
Sodium: 138 mmol/L (ref 135–145)

## 2018-03-05 LAB — GLUCOSE, CAPILLARY
Glucose-Capillary: 107 mg/dL — ABNORMAL HIGH (ref 70–99)
Glucose-Capillary: 138 mg/dL — ABNORMAL HIGH (ref 70–99)
Glucose-Capillary: 288 mg/dL — ABNORMAL HIGH (ref 70–99)
Glucose-Capillary: 110 mg/dL — ABNORMAL HIGH (ref 70–99)

## 2018-03-05 LAB — LIPID PANEL
Cholesterol: 182 mg/dL (ref 0–200)
HDL: 47 mg/dL (ref >40)
LDL Cholesterol: 121 mg/dL — ABNORMAL HIGH (ref 0–99)
Total CHOL/HDL Ratio: 3.9 RATIO
Triglycerides: 71 mg/dL (ref <150)
VLDL: 14 mg/dL (ref 0–40)

## 2018-03-05 LAB — HEMOGLOBIN A1C
Hgb A1c MFr Bld: 14.8 % — ABNORMAL HIGH (ref 4.8–5.6)
Mean Plasma Glucose: 378.06 mg/dL

## 2018-03-05 LAB — TSH: TSH: 2.657 u[IU]/mL (ref 0.350–4.500)

## 2018-03-05 MED ORDER — INSULIN ASPART 100 UNIT/ML ~~LOC~~ SOLN
0.0000 [IU] | Freq: Every day | SUBCUTANEOUS | Status: DC
  Administered 2018-03-06: 2 [IU] via SUBCUTANEOUS
  Administered 2018-03-07: 3 [IU] via SUBCUTANEOUS

## 2018-03-05 MED ORDER — INSULIN ASPART 100 UNIT/ML ~~LOC~~ SOLN
0.0000 [IU] | Freq: Three times a day (TID) | SUBCUTANEOUS | Status: DC
  Administered 2018-03-05: 2 [IU] via SUBCUTANEOUS
  Administered 2018-03-05: 8 [IU] via SUBCUTANEOUS
  Administered 2018-03-06: 2 [IU] via SUBCUTANEOUS
  Administered 2018-03-06 (×2): 3 [IU] via SUBCUTANEOUS
  Administered 2018-03-07: 5 [IU] via SUBCUTANEOUS
  Administered 2018-03-07: 3 [IU] via SUBCUTANEOUS
  Administered 2018-03-08: 2 [IU] via SUBCUTANEOUS
  Administered 2018-03-08: 8 [IU] via SUBCUTANEOUS
  Administered 2018-03-09: 5 [IU] via SUBCUTANEOUS
  Administered 2018-03-09: 3 [IU] via SUBCUTANEOUS

## 2018-03-05 NOTE — Progress Notes (Signed)
Inpatient Diabetes Program Recommendations  AACE/ADA: New Consensus Statement on Inpatient Glycemic Control (2015)  Target Ranges:  Prepandial:   less than 140 mg/dL      Peak postprandial:   less than 180 mg/dL (1-2 hours)      Critically ill patients:  140 - 180 mg/dL   Lab Results  Component Value Date   GLUCAP 107 (H) 03/05/2018   HGBA1C 14.8 (H) 03/05/2018    Review of Glycemic Control  Diabetes history: DM2 Outpatient Diabetes medications: Lantus 15 units QHS (not taking), glipizide 10 mg bid, metformin 1000 mg bid Current orders for Inpatient glycemic control: metformin 1000 mg bid, Lantus 15 units QHS, glipizide 10 mg bid  HgbA1C - 14.8% - uncontrolled  Inpatient Diabetes Program Recommendations:     Add Novolog 0-15 units tidwc and hs.  Will need to f/u with PCP regarding her diabetes management.   Continue to follow.  Thank you. Lorenda Peck, RD, LDN, CDE Inpatient Diabetes Coordinator [phone]

## 2018-03-05 NOTE — Progress Notes (Signed)
Nutrition Education Note  Pt attended group focusing on general, healthful nutrition education.  RD emphasized the importance of eating regular meals and snacks throughout the day. Consuming sugar-free beverages and incorporating fruits and vegetables into diet when possible. Provided examples of healthy snacks. Patient encouraged to leave group with a goal to improve nutrition/healthy eating.   Diet Order:  Diet Order            Diet Carb Modified Fluid consistency: Thin; Room service appropriate? Yes  Diet effective now             Pt is also offered choice of unit snacks mid-morning and mid-afternoon.  Pt is eating as desired.   If additional nutrition issues arise, please consult RD.    Jarome Matin, MS, RD, LDN, [facility] Inpatient Clinical Dietitian Pager # [phone] After hours/weekend pager # [phone]
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
## 2018-03-05 NOTE — Progress Notes (Signed)
7a-7p Shift:  D:  Pt is bright on approach and pleasant, however, pt is exhibiting some short term memory loss. .  She continues express concern regarding her living situation upon discharge.  She shows interest in learning about proper nutrition but requires reinforcement regarding diabetes teaching.  She states that she wants to gain more weight because she had lost "too much" after her gall-bladder surgery.   A:  Support, education, and encouragement provided as appropriate to situation.  Medications administered per MD order.  Level 3 checks continued for safety.   R:  Pt receptive to measures; Safety maintained.

## 2018-03-05 NOTE — [facility] Group Notes (Signed)
LCSW Group Therapy Note  03/05/2018 2:39 PM  Type of Therapy/Topic:  Group Therapy:  Balance in Life  Participation Level:  Active  Description of Group:     This group will address the concept of balance and how it feels and looks when one is unbalanced. Patients will be encouraged to process areas in their lives that are out of balance and identify reasons for remaining unbalanced. Facilitators will guide patients in utilizing problem-solving interventions to address and correct the stressor making their life unbalanced. Understanding and applying boundaries will be explored and addressed for obtaining and maintaining a balanced life. Patients will be encouraged to explore ways to assertively make their unbalanced needs known to significant others in their lives, using other group members and facilitator for support and feedback.  Therapeutic Goals:  1.     Patient will identify two or more emotions or situations they have that consume much of in their lives. 2.     Patient will identify signs/triggers that life has become out of balance: 3.     Patient will identify two ways to set boundaries in order to achieve balance in their lives: 4.     Patient will demonstrate ability to communicate their needs through discussion and/or role plays  Summary of Patient Progress: Lorraine Turner attended the entire session. She was engaged with the topic. Lorraine Turner stated that there are several areas in her life that are out of balance. She added that finding a job is one step in the right direction to put her life back in balance.     Therapeutic Modalities:   Cognitive Behavioral Therapy Solution-Focused Therapy Assertiveness Training  Lorraine Turner MSW Intern Phone: [phone] Fax: [phone]

## 2018-03-06 ENCOUNTER — Encounter (HOSPITAL_COMMUNITY): Payer: Self-pay | Admitting: Behavioral Health

## 2018-03-06 LAB — GLUCOSE, CAPILLARY
Glucose-Capillary: 134 mg/dL — ABNORMAL HIGH (ref 70–99)
Glucose-Capillary: 181 mg/dL — ABNORMAL HIGH (ref 70–99)
Glucose-Capillary: 189 mg/dL — ABNORMAL HIGH (ref 70–99)
Glucose-Capillary: 220 mg/dL — ABNORMAL HIGH (ref 70–99)

## 2018-03-06 MED ORDER — QUETIAPINE FUMARATE 25 MG PO TABS
25.0000 mg | ORAL_TABLET | Freq: Two times a day (BID) | ORAL | Status: DC
  Administered 2018-03-06 – 2018-03-08 (×5): 25 mg via ORAL
  Filled 2018-03-06 (×11): qty 1

## 2018-03-06 NOTE — Progress Notes (Signed)
Recreation Therapy Notes  Date:  1.24.20 Time: 0930 Location: [address] Dayroom  Group Topic: Stress Management  Goal Area(s) Addresses:  Patient will identify positive stress management techniques. Patient will identify benefits of using stress management post d/c.  Intervention: Stress Management  Activity :  Meditation.  LRT played a meditation that dealt with having choice.  Patients were to listen to the meditation to follow along and engage.  Education:  Stress Management, Discharge Planning.   Education Outcome: Acknowledges Education  Clinical Observations/Feedback:  Pt did not attend group.     Victorino Sparrow, LRT/CTRS         Ria Comment, Averyana Pillars A 03/06/2018 11:08 AM

## 2018-03-06 NOTE — Progress Notes (Signed)
[facility] MD Progress Note  03/06/2018 10:15 AM Lorraine Turner  MRN:  [id number]  Subjective: Patient reports some improvement compared to admission although states that today she has been feeling vaguely upset, which she states is related to feeling that some staff/peers were smiling/giggling behind her back.  Denies medication side effects.  Denies suicidal ideations.   Objective: Patient seen, case discussed with treatment team and chart reviewed prior to this evaluation. In brief:  Lorraine Turner is a 58 year old female with reported history of HTN, type 2 diabetes, bipolar disorder, schizoaffective disorder, presenting for treatment of suicidal ideation with no plan. She has a history of  ETOH use- 40oz/day + 1/2 pint brandy on weekends only. She denies other substance abuse or use.  Today patient presents vaguely irritable at first, stating, as above, that she feels some patients and even staff are laughing behind her back.  She does acknowledge that this is not a new experience for her and that she has had this same experience/feeling in public places such as stores or where there are people she does not know.  Staff does report patient has been vaguely irritable at times, although behavior has remained in good control.  Affect improved during session.  Patient denies suicidal or self-injurious ideations and presents future oriented.  She does continue to ruminate about her psychosocial stressors which include financial difficulties, daughter having a newborn child, son being incarcerated. CBG today 134 and 181.  Nursing reports patient has not required Lantus insulin x2 days, and recommendation from diabetes care coordinator is to discontinue Lantus and continue metformin, Glucotrol and NovoLog meal coverage/ nighttime coverage  Principal Problem: Schizoaffective Disorder  Diagnosis: Schizoaffective Disorder  Total Time spent with patient: 15 minutes  Past Psychiatric History: See admission  H&P  Past Medical History:  Past Medical History:  Diagnosis Date  . Anxiety   . Bipolar 1 disorder ([place])   . Depression   . Diabetes mellitus without complication ([place])   . Gallstones   . Hyperlipidemia   . Hypertension   . Neuropathy   . Schizophrenia ([place])   . Seizures ([place])    X1- febrile seizure as a child-none since    Past Surgical History:  Procedure Laterality Date  . CESAREAN SECTION  05/1998   X 1  . CHOLECYSTECTOMY N/A 03/27/2016   Procedure: LAPAROSCOPIC CHOLECYSTECTOMY;  Surgeon: Olean Ree, MD;  Location: [facility] ORS;  Service: General;  Laterality: N/A;  . TONSILLECTOMY AND ADENOIDECTOMY     age 78  . TUBAL LIGATION     Family History:  Family History  Problem Relation Age of Onset  . Diabetes Mother   . Heart disease Mother   . Kidney disease Mother   . Stroke Mother   . Hyperlipidemia Mother   . Diabetes Maternal Aunt    Family Psychiatric  History: See admission H&P Social History:  Social History   Substance and Sexual Activity  Alcohol Use Yes  . Alcohol/week: 1.0 standard drinks  . Types: 1 Cans of beer per week   Comment: One can of beer every 2-3 months.      Social History   Substance and Sexual Activity  Drug Use No    Social History   Socioeconomic History  . Marital status: Married    Spouse name: Not on file  . Number of children: Not on file  . Years of education: Not on file  . Highest education level: Not on file  Occupational History  . Not  on file  Social Needs  . Financial resource strain: Not on file  . Food insecurity:    Worry: Not on file    Inability: Not on file  . Transportation needs:    Medical: Not on file    Non-medical: Not on file  Tobacco Use  . Smoking status: Former Smoker    Types: Cigarettes    Last attempt to quit: 03/14/2013    Years since quitting: 4.9  . Smokeless tobacco: Never Used  . Tobacco comment: 1 cigarette1-2 times year, only when she gets stressed  Substance and Sexual Activity   . Alcohol use: Yes    Alcohol/week: 1.0 standard drinks    Types: 1 Cans of beer per week    Comment: One can of beer every 2-3 months.   . Drug use: No  . Sexual activity: Not Currently  Lifestyle  . Physical activity:    Days per week: Not on file    Minutes per session: Not on file  . Stress: Not on file  Relationships  . Social connections:    Talks on phone: Not on file    Gets together: Not on file    Attends religious service: Not on file    Active member of club or organization: Not on file    Attends meetings of clubs or organizations: Not on file    Relationship status: Not on file  Other Topics Concern  . Not on file  Social History Narrative  . Not on file   Additional Social History:   Sleep: Good  Appetite:  Good  Current Medications: Current Facility-Administered Medications  Medication Dose Route Frequency Provider Last Rate Last Dose  . busPIRone (BUSPAR) tablet 5 mg  5 mg Oral BID Shamar Kracke, Myer Peer, MD   5 mg at 03/06/18 0820  . feeding supplement (ENSURE ENLIVE) (ENSURE ENLIVE) liquid 237 mL  237 mL Oral BID BM Daquana Paddock, Myer Peer, MD   237 mL at 03/05/18 1558  . glipiZIDE (GLUCOTROL) tablet 10 mg  10 mg Oral BID AC Rankin, Shuvon B, NP   10 mg at 03/06/18 0625  . guaiFENesin (ROBITUSSIN) 100 MG/5ML solution 200 mg  10 mL Oral Q4H PRN Rankin, Shuvon B, NP      . Influenza vac split quadrivalent PF (FLUARIX) injection 0.5 mL  0.5 mL Intramuscular Tomorrow-1000 Edmon Magid, Myer Peer, MD   Stopped at 03/04/18 1005  . insulin aspart (novoLOG) injection 0-15 Units  0-15 Units Subcutaneous TID WC Connye Burkitt, NP   2 Units at 03/06/18 [phone]  . insulin aspart (novoLOG) injection 0-5 Units  0-5 Units Subcutaneous QHS Connye Burkitt, NP   Stopped at 03/05/18 2105  . insulin glargine (LANTUS) injection 15 Units  15 Units Subcutaneous QHS Rankin, Shuvon B, NP   Stopped at 03/05/18 2118  . lisinopril (PRINIVIL,ZESTRIL) tablet 10 mg  10 mg Oral Daily Allyssia Skluzacek, Myer Peer, MD    10 mg at 03/06/18 0820  . metFORMIN (GLUCOPHAGE) tablet 1,000 mg  1,000 mg Oral BID Rankin, Shuvon B, NP   1,000 mg at 03/06/18 0820  . QUEtiapine (SEROQUEL) tablet 100 mg  100 mg Oral QHS Cheng Dec, Myer Peer, MD   100 mg at 03/04/18 2155  . venlafaxine XR (EFFEXOR-XR) 24 hr capsule 75 mg  75 mg Oral Q breakfast Rankin, Shuvon B, NP   75 mg at 03/06/18 0820    Lab Results:  Results for orders placed or performed during the hospital encounter of 03/03/18 (from  the past 48 hour(s))  Glucose, capillary     Status: Abnormal   Collection Time: 03/04/18 12:00 PM  Result Value Ref Range   Glucose-Capillary 122 (H) 70 - 99 mg/dL   Comment 1 Notify RN    Comment 2 Document in Chart   Glucose, capillary     Status: Abnormal   Collection Time: 03/04/18  5:11 PM  Result Value Ref Range   Glucose-Capillary 158 (H) 70 - 99 mg/dL   Comment 1 Notify RN    Comment 2 Document in Chart   Glucose, capillary     Status: None   Collection Time: 03/04/18  9:10 PM  Result Value Ref Range   Glucose-Capillary 99 70 - 99 mg/dL  Glucose, capillary     Status: Abnormal   Collection Time: 03/05/18  5:58 AM  Result Value Ref Range   Glucose-Capillary 107 (H) 70 - 99 mg/dL  Hemoglobin A1c     Status: Abnormal   Collection Time: 03/05/18  6:35 AM  Result Value Ref Range   Hgb A1c MFr Bld 14.8 (H) 4.8 - 5.6 %    Comment: (NOTE) Pre diabetes:          5.7%-6.4% Diabetes:              >6.4% Glycemic control for   <7.0% adults with diabetes    Mean Plasma Glucose 378.06 mg/dL    Comment: Performed at [facility], [place] [address], [place], [place] [address]  Lipid panel     Status: Abnormal   Collection Time: 03/05/18  6:35 AM  Result Value Ref Range   Cholesterol 182 0 - 200 mg/dL   Triglycerides 71 <150 mg/dL   HDL 47 >40 mg/dL   Total CHOL/HDL Ratio 3.9 RATIO   VLDL 14 0 - 40 mg/dL   LDL Cholesterol 121 (H) 0 - 99 mg/dL    Comment:        Total Cholesterol/HDL:CHD Risk Coronary Heart Disease  Risk Table                     Men   Women  1/2 Average Risk   3.4   3.3  Average Risk       5.0   4.4  2 X Average Risk   9.6   7.1  3 X Average Risk  23.4   11.0        Use the calculated Patient Ratio above and the CHD Risk Table to determine the patient's CHD Risk.        ATP III CLASSIFICATION (LDL):  <100     mg/dL   Optimal  100-129  mg/dL   Near or Above                    Optimal  130-159  mg/dL   Borderline  160-189  mg/dL   High  >190     mg/dL   Very High Performed at [facility] [address], [place], [place] [address]   Basic metabolic panel     Status: Abnormal   Collection Time: 03/05/18  6:35 AM  Result Value Ref Range   Sodium 138 135 - 145 mmol/L   Potassium 3.5 3.5 - 5.1 mmol/L   Chloride 102 98 - 111 mmol/L   CO2 29 22 - 32 mmol/L   Glucose, Bld 108 (H) 70 - 99 mg/dL   BUN 17 6 - 20 mg/dL   Creatinine,  Ser 0.79 0.44 - 1.00 mg/dL   Calcium 9.6 8.9 - 10.3 mg/dL   GFR calc non Af Amer >60 >60 mL/min   GFR calc Af Amer >60 >60 mL/min   Anion gap 7 5 - 15    Comment: Performed at [facility], [place] [address] [place], [place] [address]  TSH     Status: None   Collection Time: 03/05/18  6:35 AM  Result Value Ref Range   TSH 2.657 0.350 - 4.500 uIU/mL    Comment: Performed by a 3rd Generation assay with a functional sensitivity of <=0.01 uIU/mL. Performed at [facility], [place] [address] [place], [place] [address]   Glucose, capillary     Status: Abnormal   Collection Time: 03/05/18 12:12 PM  Result Value Ref Range   Glucose-Capillary 138 (H) 70 - 99 mg/dL  Glucose, capillary     Status: Abnormal   Collection Time: 03/05/18  5:26 PM  Result Value Ref Range   Glucose-Capillary 288 (H) 70 - 99 mg/dL   Comment 1 Notify RN    Comment 2 Document in Chart   Glucose, capillary     Status: Abnormal   Collection Time: 03/05/18  8:50 PM  Result Value Ref Range   Glucose-Capillary 110 (H) 70 - 99  mg/dL  Glucose, capillary     Status: Abnormal   Collection Time: 03/06/18  6:22 AM  Result Value Ref Range   Glucose-Capillary 134 (H) 70 - 99 mg/dL    Blood Alcohol level:  Lab Results  Component Value Date   ETH <10 03/02/2018   ETH <10 00/86/7619    Metabolic Disorder Labs: Lab Results  Component Value Date   HGBA1C 14.8 (H) 03/05/2018   MPG 378.06 03/05/2018   MPG 352 05/08/2014   No results found for: PROLACTIN Lab Results  Component Value Date   CHOL 182 03/05/2018   TRIG 71 03/05/2018   HDL 47 03/05/2018   CHOLHDL 3.9 03/05/2018   VLDL 14 03/05/2018   LDLCALC 121 (H) 03/05/2018   LDLCALC 80 04/09/2016    Physical Findings: AIMS: Facial and Oral Movements Muscles of Facial Expression: None, normal Lips and Perioral Area: None, normal Jaw: None, normal Tongue: None, normal,Extremity Movements Upper (arms, wrists, hands, fingers): None, normal Lower (legs, knees, ankles, toes): None, normal, Trunk Movements Neck, shoulders, hips: None, normal, Overall Severity Severity of abnormal movements (highest score from questions above): None, normal Incapacitation due to abnormal movements: None, normal Patient's awareness of abnormal movements (rate only patient's report): No Awareness, Dental Status Current problems with teeth and/or dentures?: Yes Does patient usually wear dentures?: No  CIWA:    COWS:     Musculoskeletal: Strength & Muscle Tone: within normal limits Gait & Station: normal Patient leans: N/A  Psychiatric Specialty Exam: Physical Exam  Nursing note and vitals reviewed. Constitutional: She is oriented to person, place, and time. She appears well-developed and well-nourished.  Cardiovascular: Normal rate.  Respiratory: Effort normal.  Neurological: She is alert and oriented to person, place, and time.    Review of Systems  Constitutional: Negative.   Respiratory: Negative.   Cardiovascular: Negative.   Gastrointestinal: Positive for  diarrhea. Negative for nausea and vomiting.  Psychiatric/Behavioral: Positive for depression and substance abuse (hx ETOH). Negative for hallucinations, memory loss and suicidal ideas. The patient is not nervous/anxious and does not have insomnia.   At this time denies chest pain or shortness of breath, no vomiting  Blood pressure 124/76, pulse  76, temperature (!) 97.5 F (36.4 C), temperature source Oral, resp. rate 16, height 5\' 2"  (1.575 m), weight 59 kg.Body mass index is 23.78 kg/m.  General Appearance: Improving grooming  Eye Contact:  Good  Speech:  Normal Rate  Volume:  Normal  Mood:  Reports she is feeling better than before admission, describes feeling "upset"  Affect:  Initially slightly irritable, affect improved significantly during session, smiles at times appropriately  Thought Process:  Linear and Descriptions of Associations: Intact  Orientation:  Full (Time, Place, and Person)  Thought Content:  Denies hallucinations, does endorse self-referential ideations and as above reports feeling that when others smile/laugh it may be in reference to her.  No overt delusions expressed  Suicidal Thoughts:  No-denies suicidal or self-injurious ideations, no homicidal or violent ideations  Homicidal Thoughts:  No  Memory:  Recent and remote grossly intact  Judgement:  Fair  Insight:  Fair  Psychomotor Activity:  Normal  Concentration:  Concentration: Good  Recall:  Good  Fund of Knowledge:  Good  Language:  Good  Akathisia:  No  Handed:  Right  AIMS (if indicated):     Assets:  Communication Skills Desire for Improvement Resilience Social Support  ADL's:  Intact  Cognition:  WNL  Sleep:  Number of Hours: 6.75   Assessment :  58 year old female with reported history of HTN, type 2 diabetes, bipolar disorder, schizoaffective disorder, presenting for treatment of suicidal ideation with no plan. She has a history of  ETOH use- 40oz/day + 1/2 pint brandy on weekends only. She  denies other substance abuse or use.  Patient reports partial improvement compared to admission, describes improving mood, today denies suicidal ideations.  She describes self-referential ideations and reports concerned that peers/staff may be laughing about her behind her back.  Some reality testing preserved and patient able to acknowledge that this may not be the case and rather a subjective perception.  Does not appear internally preoccupied.  Thus far tolerating medications well.    Treatment Plan Summary:  Reviewed current treatment plan 03/06/2018 Daily contact with patient to assess and evaluate symptoms and progress in treatment and Medication management  Encourage group and milieu participation to work on coping skills and symptom reduction Continue Buspar 5 mg PO BID for anxiety Continue Effexor XR 75 mg PO daily for mood Increase Seroquel to 25 mgrs BID and 100 mg PO QHS for mood disorder, paranoid ideations Continue Metformin 1000 mg PO BID for diabetes Continue glipizide 10 mg PO BID for diabetes Disontinued Lantus SQ 15 units QHS for diabetes -as recommended by diabetes care coordinator- see below.  Continue Novolog 0-15 units tidwc and hs, per diabetes coordinator Continue lisinopril 20 mg PO daily for HTN  During treatment team, RN did note that patients Lantus insulin was held for the past two nights. Her average CBGs.range  in the low to mid 100's with. Patient reported she had not been regularly  compliant with her insulin prior to her admission. There was a diabetic consult on 03/05/2018. Diabetes consult was made and both Angelique Holm and Probation officer reviewed patients chart over the phone. As per Mrs. Hassell Done, patients CBG'S seemed to be fine without the Lantus insulin. Her recommendations was that the Lantus could be discontinued at this time. She also stated she will have Lorenda Peck, Inpatient Diabetes Coordinator to call to give recommendation and further discuss plan of  care. Spoke with Mrs. Deirdre Pippins who grommeted to discontinue the insulin and resume remaining diabatic medication  regimen.     Mordecai Maes, NP 03/06/2018, 10:15 AM   Patient ID: [name], female   DOB: 1960/09/28, 58 y.o.   MRN: [id number]

## 2018-03-06 NOTE — Progress Notes (Signed)
D: Patient observed sitting in dayroom watching movie with peers. Patient hyperverbal with tangential and pressured speech. Spoke of numerous home remedies she uses to manage her diabetes. Acknowledges she was off her meds but when she does take them, they work. Spoke of her chaotic home life living with her daughter who has 2 children under 16 months. Patient's affect animated, mood anxious. Denies pain, physical complaints. CBG 110 this evening.    A: Medicated per orders, no prns requested or required. Consulted with PA Simon who gave order to hold lantus (coverage not given per parameters). Medication education provided. Level III obs in place for safety. Emotional support offered. Patient encouraged to complete Suicide Safety Plan before discharge. Encouraged to attend and participate in unit programming.   R: Patient verbalizes understanding of POC. Patient denies SI/HI/AVH and remains safe on level III obs. Will continue to monitor throughout the night.

## 2018-03-06 NOTE — Progress Notes (Addendum)
Patient ID: Lorraine Turner, female   DOB: 1961/01/28, 58 y.o.   MRN: [id number]  Pt currently presents with a pleasant affect and restless behavior. Pt reports to writer that their goal is to "focus on what is important." She intends to do so by attending meetings. Pt reports poor last night due to a missed medication.  Pt provided with medications per providers orders. Pt's labs and vitals were monitored throughout the night. Pt supported emotionally and encouraged to express concerns and questions. Pt educated on medications and sleep hygiene. MD notified about held Lantus insulin for the past two nights and recent CBGs.   Pt's safety ensured with 15 minute and environmental checks. Pt currently denies SI/HI and A/V hallucinations. Pt verbally agrees to seek staff if SI/HI or A/VH occurs and to consult with staff before acting on any harmful thoughts. Will continue POC.

## 2018-03-06 NOTE — Progress Notes (Addendum)
Patient ID: Lorraine Turner, female   DOB: Aug 13, 1960, 58 y.o.   MRN: [id number]  Pt heard yelling at staff sitting at the nurses station while patients are lining up for lunch. Pt states "are you laughing at me. Ha hah ha. I see you." Pt starts pointing her finger and posturing towards staff.  Pt reports feeling like staff were laughing at her and feels that "all those AA women think they are better, but they are not. I will call them out. I will slap 'em and put them in their place." Pt reports she has a "bad temper." Pt redirected by MHT. When pt calms down, she verbally agrees to contact staff before yelling or threatening other staff members. Md notified, Seroquel 25 mg BID added to [facility]. Pt remained calm during lunch. Will continue to monitor.

## 2018-03-06 NOTE — Progress Notes (Signed)
Diabetes coordinator paged about this patient. Question was whether to continue Lantus insulin since patient has not needed it for 2 days. Recommended discontinuing the Lantus and continue current orders for Metformin, Glucotrol, and Novolog MODERATE correction scale TID & HS scale.   Harvel Ricks RN BSN CDE Diabetes Coordinator Pager: [phone]  8am-5pm

## 2018-03-06 NOTE — [facility] Group Notes (Signed)
LCSW Group Therapy Note  03/06/2018 2:10 PM  Type of Therapy and Topic:  Group Therapy:  Feelings around Relapse and Recovery  Participation Level:  Active   Description of Group:    Patients in this group will discuss emotions they experience before and after a relapse. They will process how experiencing these feelings, or avoidance of experiencing them, relates to having a relapse. Facilitator will guide patients to explore emotions they have related to recovery. Patients will be encouraged to process which emotions are more powerful. They will be guided to discuss the emotional reaction significant others in their lives may have to their relapse or recovery. Patients will be assisted in exploring ways to respond to the emotions of others without this contributing to a relapse.  Therapeutic Goals: 1. Patient will identify two or more emotions that lead to a relapse for them 2. Patient will identify two emotions that result when they relapse 3. Patient will identify two emotions related to recovery 4. Patient will demonstrate ability to communicate their needs through discussion and/or role plays   Summary of Patient Progress:  Pt was active in group and reported that when she is about to relapse, she noticed that she isolates herself and gets angry easily. Pt reported not being able to afford her medication and that leading to mental health relapse for her. Pt reports plans to get back into church and have her church as a support for her.   Therapeutic Modalities:   Cognitive Behavioral Therapy Solution-Focused Therapy Assertiveness Training Relapse Prevention Therapy   Evalina Field, MSW, LCSW Clinical Social Work 03/06/2018 2:10 PM

## 2018-03-07 ENCOUNTER — Encounter (HOSPITAL_COMMUNITY): Payer: Self-pay | Admitting: Behavioral Health

## 2018-03-07 LAB — GLUCOSE, CAPILLARY
GLUCOSE-CAPILLARY: 265 mg/dL — AB (ref 70–99)
Glucose-Capillary: 98 mg/dL (ref 70–99)
Glucose-Capillary: 210 mg/dL — ABNORMAL HIGH (ref 70–99)

## 2018-03-07 NOTE — Progress Notes (Signed)
D.  Pt pleasant on approach, no complaints voiced at this time.  Pt was positive for evening AA group, observed engaged in appropriate interaction with peers on the unit.  Pt denies SI/HI/AVH at this time.  A.  Support and encouragement offered, medications given as ordered  R.  Pt remains safe on the unit, will continue to monitor.

## 2018-03-07 NOTE — [facility] Group Notes (Signed)
[place] Group Notes:  (Nursing)  Date:  03/07/2018  Time: 130 PM Type of Therapy:  Nurse Education  Participation Level:  Active  Participation Quality:  Appropriate and Attentive  Affect:  Appropriate  Cognitive:  Appropriate  Insight:  Appropriate  Engagement in Group:  Engaged  Modes of Intervention:  Education  Summary of Progress/Problems: Life Skills Group  Waymond Cera 03/07/2018, 3:40 PM

## 2018-03-07 NOTE — Progress Notes (Signed)
Patient has been asleep since shift change. Writer had to wake her up to give her her insulin. She reported that she feels fine and is just tired. She refused the seroquel reporting that she is sleeping just fine. She denied having pain. Writer left patient so she could continue to rest. Safety maintained on unit with 15 min checks.

## 2018-03-07 NOTE — Progress Notes (Signed)
D. Pt observed in the milieu interacting well with peers and staff- reports improving mood today- per pt's self inventory, pt rates her depression, hopelessness and anxiety all 1's. Pt writes that her most important goal today is "to stay focused" and writes that she will "just stay positive" to help her meet that goal. Pt currently denies SI/HI and AV hallucinations. A. Labs and vitals monitored. Pt compliant with medications. Pt supported emotionally and encouraged to express concerns and ask questions.   R. Pt remains safe with 15 minute checks. Will continue POC.

## 2018-03-07 NOTE — [facility] Group Notes (Signed)
LCSW Group Therapy Note  03/07/2018   10:00-11:00am   Type of Therapy and Topic:  Group Therapy: Anger Cues and Responses  Participation Level:  None   Description of Group:   In this group, patients learned how to recognize the physical, cognitive, emotional, and behavioral responses they have to anger-provoking situations.  They identified a recent time they became angry and how they reacted.  They analyzed how their reaction was possibly beneficial and how it was possibly unhelpful.  The group discussed a variety of healthier coping skills that could help with such a situation in the future.  Deep breathing was practiced briefly.  Therapeutic Goals: 1. Patients will remember their last incident of anger and how they felt emotionally and physically, what their thoughts were at the time, and how they behaved. 2. Patients will identify how their behavior at that time worked for them, as well as how it worked against them. 3. Patients will explore possible new behaviors to use in future anger situations. 4. Patients will learn that anger itself is normal and cannot be eliminated, and that healthier reactions can assist with resolving conflict rather than worsening situations.  Summary of Patient Progress:  The patient  Came in at the last part of group and did not say anything.  Therapeutic Modalities:   Cognitive Behavioral Therapy  Rolanda Jay

## 2018-03-07 NOTE — Progress Notes (Signed)
[facility] MD Progress Note  03/07/2018 2:08 PM Lorraine Turner  MRN:  [id number]  Subjective: Patient states she is feeling better than on admission. Denies medication side effects. Reports sleeping better and states that today she has not felt others are smiling or laughing behind her back. She also reports she has been going to more groups. Denies suicidal ideations.    Objective: Patient seen and chart reviewed . 58 year old female with reported history of HTN, type 2 diabetes, bipolar disorder, schizoaffective disorder, presenting for treatment of suicidal ideation with no plan. She has a history of  ETOH use- 40oz/day + 1/2 pint brandy on weekends only. She denies other substance abuse or use.  Today patient presents improved, acknowledging some improvement in mood and denying any suicidal ideations. Affect presents more reactive and not irritable at this time.  She is visible on unit , and has been going to groups/visible in day room. Denies suicidal ideations. Denies medication side effects. As above, had reported self referential ideations that others were smiling or laughing at her behind her back, which was causing her to feel irritable, but today does not appear irritable, and states she has not had any of these experiences so far today. Tolerating groups and being in milieu well today. CBGs today 98 and 210. BP today 107/64      Principal Problem: Schizoaffective Disorder  Diagnosis: Schizoaffective Disorder  Total Time spent with patient: 20 minutes  Past Psychiatric History: See admission H&P  Past Medical History:  Past Medical History:  Diagnosis Date  . Anxiety   . Bipolar 1 disorder ([place])   . Depression   . Diabetes mellitus without complication ([place])   . Gallstones   . Hyperlipidemia   . Hypertension   . Neuropathy   . Schizophrenia ([place])   . Seizures ([place])    X1- febrile seizure as a child-none since    Past Surgical History:  Procedure Laterality Date   . CESAREAN SECTION  05/1998   X 1  . CHOLECYSTECTOMY N/A 03/27/2016   Procedure: LAPAROSCOPIC CHOLECYSTECTOMY;  Surgeon: Olean Ree, MD;  Location: [facility] ORS;  Service: General;  Laterality: N/A;  . TONSILLECTOMY AND ADENOIDECTOMY     age 41  . TUBAL LIGATION     Family History:  Family History  Problem Relation Age of Onset  . Diabetes Mother   . Heart disease Mother   . Kidney disease Mother   . Stroke Mother   . Hyperlipidemia Mother   . Diabetes Maternal Aunt    Family Psychiatric  History: See admission H&P Social History:  Social History   Substance and Sexual Activity  Alcohol Use Yes  . Alcohol/week: 1.0 standard drinks  . Types: 1 Cans of beer per week   Comment: One can of beer every 2-3 months.      Social History   Substance and Sexual Activity  Drug Use No    Social History   Socioeconomic History  . Marital status: Married    Spouse name: Not on file  . Number of children: Not on file  . Years of education: Not on file  . Highest education level: Not on file  Occupational History  . Not on file  Social Needs  . Financial resource strain: Not on file  . Food insecurity:    Worry: Not on file    Inability: Not on file  . Transportation needs:    Medical: Not on file    Non-medical: Not on file  Tobacco Use  . Smoking status: Former Smoker    Types: Cigarettes    Last attempt to quit: 03/14/2013    Years since quitting: 4.9  . Smokeless tobacco: Never Used  . Tobacco comment: 1 cigarette1-2 times year, only when she gets stressed  Substance and Sexual Activity  . Alcohol use: Yes    Alcohol/week: 1.0 standard drinks    Types: 1 Cans of beer per week    Comment: One can of beer every 2-3 months.   . Drug use: No  . Sexual activity: Not Currently  Lifestyle  . Physical activity:    Days per week: Not on file    Minutes per session: Not on file  . Stress: Not on file  Relationships  . Social connections:    Talks on phone: Not on file     Gets together: Not on file    Attends religious service: Not on file    Active member of club or organization: Not on file    Attends meetings of clubs or organizations: Not on file    Relationship status: Not on file  Other Topics Concern  . Not on file  Social History Narrative  . Not on file   Additional Social History:   Sleep: Good  Appetite:  Good  Current Medications: Current Facility-Administered Medications  Medication Dose Route Frequency Provider Last Rate Last Dose  . busPIRone (BUSPAR) tablet 5 mg  5 mg Oral BID Jacklyn Branan, Myer Peer, MD   5 mg at 03/07/18 0815  . feeding supplement (ENSURE ENLIVE) (ENSURE ENLIVE) liquid 237 mL  237 mL Oral BID BM Florabel Faulks, Myer Peer, MD   237 mL at 03/05/18 1558  . glipiZIDE (GLUCOTROL) tablet 10 mg  10 mg Oral BID AC Rankin, Shuvon B, NP   10 mg at 03/07/18 0619  . guaiFENesin (ROBITUSSIN) 100 MG/5ML solution 200 mg  10 mL Oral Q4H PRN Rankin, Shuvon B, NP      . Influenza vac split quadrivalent PF (FLUARIX) injection 0.5 mL  0.5 mL Intramuscular Tomorrow-1000 Doug Bucklin, Myer Peer, MD   Stopped at 03/04/18 1005  . insulin aspart (novoLOG) injection 0-15 Units  0-15 Units Subcutaneous TID WC Connye Burkitt, NP   5 Units at 03/07/18 1206  . insulin aspart (novoLOG) injection 0-5 Units  0-5 Units Subcutaneous QHS Connye Burkitt, NP   2 Units at 03/06/18 2355  . lisinopril (PRINIVIL,ZESTRIL) tablet 10 mg  10 mg Oral Daily Nicci Vaughan, Myer Peer, MD   10 mg at 03/07/18 0815  . metFORMIN (GLUCOPHAGE) tablet 1,000 mg  1,000 mg Oral BID Rankin, Shuvon B, NP   1,000 mg at 03/07/18 0815  . QUEtiapine (SEROQUEL) tablet 100 mg  100 mg Oral QHS Alexsia Klindt, Myer Peer, MD   100 mg at 03/04/18 2155  . QUEtiapine (SEROQUEL) tablet 25 mg  25 mg Oral BID Iran Kievit, Myer Peer, MD   25 mg at 03/07/18 0815  . venlafaxine XR (EFFEXOR-XR) 24 hr capsule 75 mg  75 mg Oral Q breakfast Rankin, Shuvon B, NP   75 mg at 03/07/18 0093    Lab Results:  Results for orders placed or  performed during the hospital encounter of 03/03/18 (from the past 48 hour(s))  Glucose, capillary     Status: Abnormal   Collection Time: 03/05/18  5:26 PM  Result Value Ref Range   Glucose-Capillary 288 (H) 70 - 99 mg/dL   Comment 1 Notify RN    Comment 2 Document in Chart  Glucose, capillary     Status: Abnormal   Collection Time: 03/05/18  8:50 PM  Result Value Ref Range   Glucose-Capillary 110 (H) 70 - 99 mg/dL  Glucose, capillary     Status: Abnormal   Collection Time: 03/06/18  6:22 AM  Result Value Ref Range   Glucose-Capillary 134 (H) 70 - 99 mg/dL  Glucose, capillary     Status: Abnormal   Collection Time: 03/06/18 12:01 PM  Result Value Ref Range   Glucose-Capillary 181 (H) 70 - 99 mg/dL   Comment 1 Notify RN    Comment 2 Document in Chart   Glucose, capillary     Status: Abnormal   Collection Time: 03/06/18  5:15 PM  Result Value Ref Range   Glucose-Capillary 189 (H) 70 - 99 mg/dL   Comment 1 Notify RN    Comment 2 Document in Chart   Glucose, capillary     Status: Abnormal   Collection Time: 03/06/18  8:48 PM  Result Value Ref Range   Glucose-Capillary 220 (H) 70 - 99 mg/dL  Glucose, capillary     Status: None   Collection Time: 03/07/18  6:08 AM  Result Value Ref Range   Glucose-Capillary 98 70 - 99 mg/dL  Glucose, capillary     Status: Abnormal   Collection Time: 03/07/18 12:01 PM  Result Value Ref Range   Glucose-Capillary 210 (H) 70 - 99 mg/dL    Blood Alcohol level:  Lab Results  Component Value Date   ETH <10 03/02/2018   ETH <10 37/11/6267    Metabolic Disorder Labs: Lab Results  Component Value Date   HGBA1C 14.8 (H) 03/05/2018   MPG 378.06 03/05/2018   MPG 352 05/08/2014   No results found for: PROLACTIN Lab Results  Component Value Date   CHOL 182 03/05/2018   TRIG 71 03/05/2018   HDL 47 03/05/2018   CHOLHDL 3.9 03/05/2018   VLDL 14 03/05/2018   LDLCALC 121 (H) 03/05/2018   LDLCALC 80 04/09/2016    Physical Findings: AIMS:  Facial and Oral Movements Muscles of Facial Expression: None, normal Lips and Perioral Area: None, normal Jaw: None, normal Tongue: None, normal,Extremity Movements Upper (arms, wrists, hands, fingers): None, normal Lower (legs, knees, ankles, toes): None, normal, Trunk Movements Neck, shoulders, hips: None, normal, Overall Severity Severity of abnormal movements (highest score from questions above): None, normal Incapacitation due to abnormal movements: None, normal Patient's awareness of abnormal movements (rate only patient's report): No Awareness, Dental Status Current problems with teeth and/or dentures?: Yes Does patient usually wear dentures?: No  CIWA:    COWS:     Musculoskeletal: Strength & Muscle Tone: within normal limits Gait & Station: normal Patient leans: N/A  Psychiatric Specialty Exam: Physical Exam  Nursing note and vitals reviewed. Constitutional: She is oriented to person, place, and time. She appears well-developed and well-nourished.  Cardiovascular: Normal rate.  Respiratory: Effort normal.  Neurological: She is alert and oriented to person, place, and time.    Review of Systems  Constitutional: Negative.   Respiratory: Negative.   Cardiovascular: Negative.   Gastrointestinal: Positive for diarrhea. Negative for nausea and vomiting.  Psychiatric/Behavioral: Positive for depression and substance abuse (hx ETOH). Negative for hallucinations, memory loss and suicidal ideas. The patient is not nervous/anxious and does not have insomnia.   At this time denies chest pain or shortness of breath, no vomiting  Blood pressure 107/64, pulse 74, temperature 97.6 F (36.4 C), resp. rate 16, height 5\' 2"  (1.575 m),  weight 59 kg.Body mass index is 23.78 kg/m.  General Appearance: Improving grooming  Eye Contact:  Good  Speech:  Normal Rate  Volume:  Normal  Mood:  reports feeling better today  Affect:  currently reactive, smiles at times appropriately, not  irritable at this time  Thought Process:  Linear and Descriptions of Associations: Intact  Orientation:  Full (Time, Place, and Person)  Thought Content:  no hallucinations, no delusions expressed today, no self referential ideations described today  Suicidal Thoughts:  No-denies suicidal or self-injurious ideations, no homicidal or violent ideations  Homicidal Thoughts:  No  Memory:  Recent and remote grossly intact  Judgement:   improving   Insight:  Fair/ improving   Psychomotor Activity:  Normal no psychomotor agitation  Concentration:  Concentration: Good  Recall:  Good  Fund of Knowledge:  Good  Language:  Good  Akathisia:  No  Handed:  Right  AIMS (if indicated):     Assets:  Communication Skills Desire for Improvement Resilience Social Support  ADL's:  Intact  Cognition:  WNL  Sleep:  Number of Hours: 6.75   Assessment :  58 year old female with reported history of HTN, type 2 diabetes, bipolar disorder, schizoaffective disorder, presenting for treatment of suicidal ideation with no plan. She has a history of  ETOH use- 40oz/day + 1/2 pint brandy on weekends only. She denies other substance abuse or use.  Presenting with improving mood and a more reactive affect. No suicidal ideations today. No self referential ideations expressed today and does not appear guarded or irritable. Thus far tolerating Effexor XR, Seroquel trial well .    Treatment Plan Summary:  Reviewed current treatment plan 03/07/2018.  Daily contact with patient to assess and evaluate symptoms and progress in treatment and Medication management  Encourage group and milieu participation to work on coping skills and symptom reduction Continue Buspar 5 mg PO BID for anxiety Continue Effexor XR 75 mg PO daily for depression, mood  Continue Seroquel 25 mgrs BID and 100 mg PO QHS for mood disorder, paranoid ideations Continue Metformin 1000 mg PO BID for diabetes Continue Glipizide 10 mg PO BID for diabetes   Continue Novolog 0-15 units tidwc and hs, per diabetes coordinator Continue Lisinopril 20 mg PO daily for HTN  .     Jenne Campus, MD 03/07/2018, 2:08 PM   Patient ID: [name], female   DOB: [birth year], 58 y.o.   MRN: [id number]

## 2018-03-07 NOTE — Progress Notes (Signed)
Patient attended the evening A.A.meeting without incident.

## 2018-03-08 LAB — GLUCOSE, CAPILLARY
GLUCOSE-CAPILLARY: 144 mg/dL — AB (ref 70–99)
Glucose-Capillary: 166 mg/dL — ABNORMAL HIGH (ref 70–99)
Glucose-Capillary: 79 mg/dL (ref 70–99)
Glucose-Capillary: 260 mg/dL — ABNORMAL HIGH (ref 70–99)
Glucose-Capillary: 124 mg/dL — ABNORMAL HIGH (ref 70–99)

## 2018-03-08 NOTE — Progress Notes (Signed)
[facility] MD Progress Note  03/08/2018 3:17 PM Lorraine Turner  MRN:  [id number]  Subjective: Patient states she is feeling better than on admission. Denies medication side effects. Reports sleeping better and states that today she has not felt others are smiling or laughing behind her back. She also reports she has been going to more groups. Denies suicidal ideations.    Objective: Patient seen and chart reviewed . Patient is a 58 year old female with reported history of HTN, type 2 diabetes, bipolar disorder, schizoaffective disorder, presenting for treatment of suicidal ideation with no plan. She has a history of  ETOH use- 40oz/day + 1/2 pint brandy on weekends only. She denies other substance abuse or use.  Today patient presents improved, acknowledging some improvement in mood and denying any suicidal ideations. Affect presents more reactive and not irritable at this time. She was in her room, lying on her bed after morning group. She stated she was very tired and did not really want to talk much. She is pleasant on approach.  She is visible on unit , and has been going to groups/visible in day room. Denies suicidal ideations. Denies medication side effects. As above, had reported self referential ideations that others were smiling or laughing at her behind her back, which was causing her to feel irritable, but today does not appear irritable, and states she has not had any of these experiences so far today. Tolerating groups and being in milieu well today. CBGs today 79 and 144. BP today 123/69      Principal Problem: Schizoaffective Disorder  Diagnosis: Schizoaffective Disorder  Total Time spent with patient: 20 minutes  Past Psychiatric History: See admission H&P  Past Medical History:  Past Medical History:  Diagnosis Date  . Anxiety   . Bipolar 1 disorder ([place])   . Depression   . Diabetes mellitus without complication ([place])   . Gallstones   . Hyperlipidemia   .  Hypertension   . Neuropathy   . Schizophrenia ([place])   . Seizures ([place])    X1- febrile seizure as a child-none since    Past Surgical History:  Procedure Laterality Date  . CESAREAN SECTION  05/1998   X 1  . CHOLECYSTECTOMY N/A 03/27/2016   Procedure: LAPAROSCOPIC CHOLECYSTECTOMY;  Surgeon: Olean Ree, MD;  Location: [facility] ORS;  Service: General;  Laterality: N/A;  . TONSILLECTOMY AND ADENOIDECTOMY     age 19  . TUBAL LIGATION     Family History:  Family History  Problem Relation Age of Onset  . Diabetes Mother   . Heart disease Mother   . Kidney disease Mother   . Stroke Mother   . Hyperlipidemia Mother   . Diabetes Maternal Aunt    Family Psychiatric  History: See admission H&P Social History:  Social History   Substance and Sexual Activity  Alcohol Use Yes  . Alcohol/week: 1.0 standard drinks  . Types: 1 Cans of beer per week   Comment: One can of beer every 2-3 months.      Social History   Substance and Sexual Activity  Drug Use No    Social History   Socioeconomic History  . Marital status: Married    Spouse name: Not on file  . Number of children: Not on file  . Years of education: Not on file  . Highest education level: Not on file  Occupational History  . Not on file  Social Needs  . Financial resource strain: Not on file  . Food  insecurity:    Worry: Not on file    Inability: Not on file  . Transportation needs:    Medical: Not on file    Non-medical: Not on file  Tobacco Use  . Smoking status: Former Smoker    Types: Cigarettes    Last attempt to quit: 03/14/2013    Years since quitting: 4.9  . Smokeless tobacco: Never Used  . Tobacco comment: 1 cigarette1-2 times year, only when she gets stressed  Substance and Sexual Activity  . Alcohol use: Yes    Alcohol/week: 1.0 standard drinks    Types: 1 Cans of beer per week    Comment: One can of beer every 2-3 months.   . Drug use: No  . Sexual activity: Not Currently  Lifestyle  . Physical  activity:    Days per week: Not on file    Minutes per session: Not on file  . Stress: Not on file  Relationships  . Social connections:    Talks on phone: Not on file    Gets together: Not on file    Attends religious service: Not on file    Active member of club or organization: Not on file    Attends meetings of clubs or organizations: Not on file    Relationship status: Not on file  Other Topics Concern  . Not on file  Social History Narrative  . Not on file   Additional Social History:   Sleep: Good  Appetite:  Good  Current Medications: Current Facility-Administered Medications  Medication Dose Route Frequency Provider Last Rate Last Dose  . busPIRone (BUSPAR) tablet 5 mg  5 mg Oral BID Cobos, Myer Peer, MD   5 mg at 03/08/18 1950  . feeding supplement (ENSURE ENLIVE) (ENSURE ENLIVE) liquid 237 mL  237 mL Oral BID BM Cobos, Myer Peer, MD   237 mL at 03/08/18 1213  . glipiZIDE (GLUCOTROL) tablet 10 mg  10 mg Oral BID AC Rankin, Shuvon B, NP   10 mg at 03/08/18 0618  . guaiFENesin (ROBITUSSIN) 100 MG/5ML solution 200 mg  10 mL Oral Q4H PRN Rankin, Shuvon B, NP      . Influenza vac split quadrivalent PF (FLUARIX) injection 0.5 mL  0.5 mL Intramuscular Tomorrow-1000 Cobos, Myer Peer, MD   Stopped at 03/04/18 1005  . insulin aspart (novoLOG) injection 0-15 Units  0-15 Units Subcutaneous TID WC Connye Burkitt, NP   2 Units at 03/08/18 1213  . insulin aspart (novoLOG) injection 0-5 Units  0-5 Units Subcutaneous QHS Connye Burkitt, NP   3 Units at 03/07/18 2235  . lisinopril (PRINIVIL,ZESTRIL) tablet 10 mg  10 mg Oral Daily Cobos, Myer Peer, MD   Stopped at 03/08/18 [phone]  . metFORMIN (GLUCOPHAGE) tablet 1,000 mg  1,000 mg Oral BID Rankin, Shuvon B, NP   1,000 mg at 03/08/18 0832  . QUEtiapine (SEROQUEL) tablet 100 mg  100 mg Oral QHS Cobos, Myer Peer, MD   100 mg at 03/07/18 2220  . QUEtiapine (SEROQUEL) tablet 25 mg  25 mg Oral BID Cobos, Myer Peer, MD   25 mg at 03/08/18  7124  . venlafaxine XR (EFFEXOR-XR) 24 hr capsule 75 mg  75 mg Oral Q breakfast Rankin, Shuvon B, NP   75 mg at 03/08/18 5809    Lab Results:  Results for orders placed or performed during the hospital encounter of 03/03/18 (from the past 48 hour(s))  Glucose, capillary     Status: Abnormal   Collection  Time: 03/06/18  5:15 PM  Result Value Ref Range   Glucose-Capillary 189 (H) 70 - 99 mg/dL   Comment 1 Notify RN    Comment 2 Document in Chart   Glucose, capillary     Status: Abnormal   Collection Time: 03/06/18  8:48 PM  Result Value Ref Range   Glucose-Capillary 220 (H) 70 - 99 mg/dL  Glucose, capillary     Status: None   Collection Time: 03/07/18  6:08 AM  Result Value Ref Range   Glucose-Capillary 98 70 - 99 mg/dL  Glucose, capillary     Status: Abnormal   Collection Time: 03/07/18 12:01 PM  Result Value Ref Range   Glucose-Capillary 210 (H) 70 - 99 mg/dL  Glucose, capillary     Status: Abnormal   Collection Time: 03/07/18  5:07 PM  Result Value Ref Range   Glucose-Capillary 166 (H) 70 - 99 mg/dL   Comment 1 Notify RN    Comment 2 Document in Chart   Glucose, capillary     Status: Abnormal   Collection Time: 03/07/18  8:42 PM  Result Value Ref Range   Glucose-Capillary 265 (H) 70 - 99 mg/dL   Comment 1 Notify RN    Comment 2 Document in Chart   Glucose, capillary     Status: None   Collection Time: 03/08/18  6:14 AM  Result Value Ref Range   Glucose-Capillary 79 70 - 99 mg/dL  Glucose, capillary     Status: Abnormal   Collection Time: 03/08/18 11:41 AM  Result Value Ref Range   Glucose-Capillary 144 (H) 70 - 99 mg/dL   Comment 1 Notify RN    Comment 2 Document in Chart     Blood Alcohol level:  Lab Results  Component Value Date   ETH <10 03/02/2018   ETH <10 16/38/4665    Metabolic Disorder Labs: Lab Results  Component Value Date   HGBA1C 14.8 (H) 03/05/2018   MPG 378.06 03/05/2018   MPG 352 05/08/2014   No results found for: PROLACTIN Lab Results   Component Value Date   CHOL 182 03/05/2018   TRIG 71 03/05/2018   HDL 47 03/05/2018   CHOLHDL 3.9 03/05/2018   VLDL 14 03/05/2018   LDLCALC 121 (H) 03/05/2018   LDLCALC 80 04/09/2016    Physical Findings: AIMS: Facial and Oral Movements Muscles of Facial Expression: None, normal Lips and Perioral Area: None, normal Jaw: None, normal Tongue: None, normal,Extremity Movements Upper (arms, wrists, hands, fingers): None, normal Lower (legs, knees, ankles, toes): None, normal, Trunk Movements Neck, shoulders, hips: None, normal, Overall Severity Severity of abnormal movements (highest score from questions above): None, normal Incapacitation due to abnormal movements: None, normal Patient's awareness of abnormal movements (rate only patient's report): No Awareness, Dental Status Current problems with teeth and/or dentures?: Yes Does patient usually wear dentures?: No  CIWA:  CIWA-Ar Total: 0 COWS:     Musculoskeletal: Strength & Muscle Tone: within normal limits Gait & Station: normal Patient leans: N/A  Psychiatric Specialty Exam: Physical Exam  Nursing note and vitals reviewed. Constitutional: She is oriented to person, place, and time. She appears well-developed and well-nourished.  Cardiovascular: Normal rate.  Respiratory: Effort normal.  Neurological: She is alert and oriented to person, place, and time.    Review of Systems  Constitutional: Negative.   Respiratory: Negative.   Cardiovascular: Negative.   Gastrointestinal: Positive for diarrhea. Negative for nausea and vomiting.  Psychiatric/Behavioral: Positive for depression and substance abuse (hx ETOH). Negative  for hallucinations, memory loss and suicidal ideas. The patient is not nervous/anxious and does not have insomnia.   At this time denies chest pain or shortness of breath, no vomiting  Blood pressure 123/69, pulse 84, temperature 98.1 F (36.7 C), resp. rate 16, height 5\' 2"  (1.575 m), weight 59 kg.Body  mass index is 23.78 kg/m.  General Appearance: Improving grooming, clean and neat today  Eye Contact:  Good  Speech:  Normal Rate  Volume:  Normal  Mood:  reports feeling better today  Affect:  currently reactive, smiles at times appropriately, not irritable at this time  Thought Process:  Linear and Descriptions of Associations: Intact  Orientation:  Full (Time, Place, and Person)  Thought Content:  no hallucinations, no delusions expressed today, no self referential ideations described today  Suicidal Thoughts:  No-denies suicidal or self-injurious ideations, no homicidal or violent ideations  Homicidal Thoughts:  No  Memory:  Recent and remote grossly intact  Judgement:   improving   Insight:  Fair/ improving   Psychomotor Activity:  Normal no psychomotor agitation  Concentration:  Concentration: Good  Recall:  Good  Fund of Knowledge:  Good  Language:  Good  Akathisia:  No  Handed:  Right  AIMS (if indicated):     Assets:  Communication Skills Desire for Improvement Resilience Social Support  ADL's:  Intact  Cognition:  WNL  Sleep:  Number of Hours: 6.75   Assessment :  58 year old female with reported history of HTN, type 2 diabetes, bipolar disorder, schizoaffective disorder, presenting for treatment of suicidal ideation with no plan. She has a history of  ETOH use- 40oz/day + 1/2 pint brandy on weekends only. She denies other substance abuse or use.  Presenting with improving mood and a more reactive affect. No suicidal/homicidal ideations today. No self referential ideations expressed today and does not appear guarded or irritable. Thus far tolerating Effexor XR, Seroquel trial well .    Treatment Plan Summary:  Reviewed current treatment plan 03/08/2018.  Daily contact with patient to assess and evaluate symptoms and progress in treatment and Medication management  Encourage group and milieu participation to work on coping skills and symptom reduction Continue  Buspar 5 mg PO BID for anxiety Continue Effexor XR 75 mg PO daily for depression, mood  Continue Seroquel 25 mgrs BID and 100 mg PO QHS for mood disorder, paranoid ideations Continue Metformin 1000 mg PO BID for diabetes Continue Glipizide 10 mg PO BID for diabetes  Continue Novolog 0-15 units tidwc and hs, per diabetes coordinator Continue Lisinopril 20 mg PO daily for HTN   Ethelene Hal, NP 03/08/2018, 3:17 PM

## 2018-03-08 NOTE — [facility] Group Notes (Deleted)
[place] Group Notes:  (Nursing)  Date:  03/08/2018  Time:200 PM Type of Therapy:  Nurse Education  Participation Level:  Did Not Attend   Waymond Cera 03/08/2018, 6:03 PM

## 2018-03-08 NOTE — Progress Notes (Signed)
D. Pt stayed in bed for much of the morning, but was out of bed before lunch. Pt is friendly during interactions and interacts well with peers and staff in the milieu. Pt reports having slept well last night- endorses a good appetite and denies pain. Per pt's self inventory, pt rates her depression, hopelessness and anxiety all 1's. Pt writes that her most important goal today is "stay focus and positive" and writes that she will "attend all meetings" in order to meet her goal.  Pt currently denies SI/HI and AV hallucinations.  A. Labs and vitals monitored. Pt compliant with medications. Pt supported emotionally and encouraged to express concerns and ask questions.   R. Pt remains safe with 15 minute checks. Will continue POC.

## 2018-03-08 NOTE — Progress Notes (Signed)
Patient did not attend the evening speaker AA meeting. Pt was notified that group was beginning but remained in bed.   

## 2018-03-08 NOTE — BHH Group Notes (Signed)
[facility] Group Notes:  (Nursing)  Date:  03/08/2018  Time:  200 PM Type of Therapy:  Nurse Education  Participation Level:  Active  Participation Quality:  Appropriate  Affect:  Appropriate  Cognitive:  Appropriate  Insight:  Appropriate  Engagement in Group:  Engaged  Modes of Intervention:  Education  Summary of Progress/Problems: Life Skills group/ Anger Management  Waymond Cera 03/08/2018, 6:05 PM

## 2018-03-08 NOTE — Progress Notes (Signed)
D. Pt pleasant on approach, no complaints voiced at this time.  Pt was positive for evening AA group, observed engaged in appropriate interaction with peers on the unit.  Pt denies SI/HI/AVH at this time.  A.  Support and encouragement offered, medication given as ordered  R.  Pt remains safe on the unit, will continue to monitor.

## 2018-03-09 LAB — GLUCOSE, CAPILLARY
Glucose-Capillary: 164 mg/dL — ABNORMAL HIGH (ref 70–99)
Glucose-Capillary: 210 mg/dL — ABNORMAL HIGH (ref 70–99)

## 2018-03-09 MED ORDER — QUETIAPINE FUMARATE 100 MG PO TABS: 100.0000 mg | ORAL_TABLET | Freq: Every day | ORAL | 0 refills | Status: DC

## 2018-03-09 MED ORDER — "INSULIN SYRINGE-NEEDLE U-100 31G X 5/16"" 0.3 ML MISC": 1.0000 | Freq: Three times a day (TID) | 12 refills | Status: DC

## 2018-03-09 MED ORDER — METFORMIN HCL 1000 MG PO TABS: 1000.0000 mg | ORAL_TABLET | Freq: Two times a day (BID) | ORAL | 3 refills | Status: DC

## 2018-03-09 MED ORDER — TRUE METRIX METER W/DEVICE KIT: 1.0000 | PACK | Freq: Three times a day (TID) | 0 refills | Status: DC

## 2018-03-09 MED ORDER — BUSPIRONE HCL 5 MG PO TABS: 5.0000 mg | ORAL_TABLET | Freq: Two times a day (BID) | ORAL | 0 refills | Status: DC

## 2018-03-09 MED ORDER — VENLAFAXINE HCL ER 75 MG PO CP24: 75.0000 mg | ORAL_CAPSULE | Freq: Every day | ORAL | 3 refills | Status: DC

## 2018-03-09 MED ORDER — GLIPIZIDE 10 MG PO TABS: 10.0000 mg | ORAL_TABLET | Freq: Two times a day (BID) | ORAL | 3 refills | Status: DC

## 2018-03-09 MED ORDER — LISINOPRIL 10 MG PO TABS: 10.0000 mg | ORAL_TABLET | Freq: Every day | ORAL | Status: DC

## 2018-03-09 MED ORDER — QUETIAPINE FUMARATE 25 MG PO TABS: 25.0000 mg | ORAL_TABLET | Freq: Two times a day (BID) | ORAL | 0 refills | Status: DC

## 2018-03-09 MED ORDER — TRUEPLUS LANCETS 28G MISC: 1.0000 | Freq: Three times a day (TID) | 12 refills | Status: DC

## 2018-03-09 NOTE — Progress Notes (Signed)
Recreation Therapy Notes  Date:  1.27.20 Time: 0930 Location: 300 Hall Dayroom  Group Topic: Stress Management  Goal Area(s) Addresses:  Patient will identify positive stress management techniques. Patient will identify benefits of using stress management post d/c.  Intervention:  Stress Management  Activity :  Meditation.  LRT introduced stress management technique of meditation.  LRT played a meditation that focused on the possibilities the day could bring.  Patients were to follow along as meditation played in order to engage in meditation.  Education:  Stress Management, Discharge Planning.   Education Outcome: Acknowledges Education  Clinical Observations/Feedback: Pt did not attend group.    Victorino Sparrow, LRT/CTRS         Victorino Sparrow A 03/09/2018 12:19 PM

## 2018-03-09 NOTE — BHH Suicide Risk Assessment (Addendum)
[facility] Discharge Suicide Risk Assessment   Principal Problem: MDD (major depressive disorder), severe ([place]) Discharge Diagnoses: Principal Problem:   MDD (major depressive disorder), severe ([place])   Total Time spent with patient: 30 minutes  Musculoskeletal: Strength & Muscle Tone: within normal limits Gait & Station: normal Patient leans: N/A  Psychiatric Specialty Exam: ROS denies headache, no chest pain, no shortness of breath, no vomiting   Blood pressure (!) 137/59, pulse 79, temperature 97.7 F (36.5 C), temperature source Oral, resp. rate 16, height 5\' 2"  (1.575 m), weight 59 kg.Body mass index is 23.78 kg/m.  General Appearance: improved grooming   Eye Contact::  Good  Speech:  Normal Rate409  Volume:  Normal  Mood:  reports she is feeling better than on admission, and describes her mood as 9/10 with 10 being best   Affect:  mildly constricted, but affect reactive, smiles at times appropriately   Thought Process:  Linear and Descriptions of Associations: Intact  Orientation:  Full (Time, Place, and Person)  Thought Content:  no hallucinations, no delusions, not internally preoccupied ,denies any current self referential ideations   Suicidal Thoughts:  No denies suicidal or self injurious ideations, denies homicidal ideations, and at this time specifically also denies any violent or homicidal ideations towards her ex boyfriend. States " I am done with him, I am not even going to see him again, I am going to avoid him"  Homicidal Thoughts:  No  Memory:  recent and remote grossly intact   Judgement:  Fair  Insight:  Fair  Psychomotor Activity:  Normal  Concentration:  Good  Recall:  Good  Fund of Knowledge:Good  Language: Good  Akathisia:  Negative  Handed:  Right  AIMS (if indicated):   no abnormal or involuntary movements   Assets:  Communication Skills Desire for Improvement Resilience  Sleep:  Number of Hours: 6.75  Cognition: WNL  ADL's:  Intact   Mental Status  Per Nursing Assessment::   On Admission:  NA  Demographic Factors:  2, widowed, has 5 children. Lives with two of her children.  Loss Factors: Husband died in 05-28-2016, financial difficulties, concerned her home may go into foreclosure, son incarcerated   Historical Factors: History of depression and history of past suicide attempt,last psychiatric admission was in May 29, 2014 for depression and suicidal ideations   Risk Reduction Factors:   Sense of responsibility to family, Living with another person, especially a relative and Positive coping skills or problem solving skills  Continued Clinical Symptoms:  At this time patient is alert, attentive, well related, calm, mood described as significantly improved compared to admission and presents with a mildly constricted but reactive range of affect. No thought disorder, no suicidal or self injurious ideations, no homicidal ideations- see above, no hallucinations, no delusions , not internally preoccupied, future oriented . Denies medication side effects. We reviewed medication side effects- reports Seroquel has been effective and well tolerated. We have reviewed risk of motor side effects and metabolic disturbances/ weight gain, importance of periodic monitoring of labs by prescribing MD. No disruptive or agitation behaviors on unit, polite on approach.   Cognitive Features That Contribute To Risk:  No gross cognitive deficits noted upon discharge. Is alert , attentive, and oriented x 3    Suicide Risk:  Mild:  Suicidal ideation of limited frequency, intensity, duration, and specificity.  There are no identifiable plans, no associated intent, mild dysphoria and related symptoms, good self-control (both objective and subjective assessment), few other risk factors, and  identifiable protective factors, including available and accessible social support.  Follow-up Information    [facility] Follow up.   Specialty:  Professional  Counselor Why:  Walk in within 3 days of hospital discharge to be assessed for outpatient mental health services including medication management, therapy, and groups. Walk in hours: 8am-12pm Monday-Friday. Thank you.  Contact information: [employer] [facility] [place] [address] [phone]        [facility] Follow up on 03/12/2018.   Why:  Primary care clinic for uninsured. Follow up for diabetes management. Please call if need to reschedule appointment. Contact information: [place] [phone]          Plan Of Care/Follow-up recommendations:  Activity:  as tolerated Diet:  heart healthy, diabetic diet Tests:  NA Other:  See below  Patient is expressing readiness for discharge and is leaving unit in good spirits  Plans to return home Follow up as above   Jenne Campus, MD 03/09/2018, 1:47 PM

## 2018-03-09 NOTE — Progress Notes (Addendum)
  [facility] Adult Case Management Discharge Plan :  Will you be returning to the same living situation after discharge:  Yes,  home At discharge, do you have transportation home?: Yes-taxi voucher--pt did not have anyone come to pick her up and could not find a ride.  Do you have the ability to pay for your medications: Yes,  [facility]  Release of information consent forms completed and submitted to medical records by CSW.   Patient to Follow up at: Follow-up Information    [facility] Follow up.   Specialty:  Professional Counselor Why:  Walk in within 3 days of hospital discharge to be assessed for outpatient mental health services including medication management, therapy, and groups. Walk in hours: 8am-12pm Monday-Friday. Thank you.  Contact information: [employer] [facility] [place] [phone]        [facility] Follow up on 03/12/2018.   Why:  Primary care clinic for uninsured. Follow up for diabetes management. Please call if need to reschedule appointment. Contact information: [address] [place] [address] [phone]          Next level of care provider has access to [place] and Suicide Prevention discussed: Yes,  SPE completed with pt; pt declined to consent to collateral contact.  Have you used any form of tobacco in the last 30 days? (Cigarettes, Smokeless Tobacco, Cigars, and/or Pipes): No  Has patient been referred to the Quitline?: N/A patient is not a smoker  Patient has been referred for addiction treatment: Yes  Avelina Laine, LCSW 03/09/2018, 12:27 PM

## 2018-03-09 NOTE — Tx Team (Signed)
Interdisciplinary Treatment and Diagnostic Plan Update  03/09/2018 Time of Session: [place] MRN: [id number]  Principal Diagnosis: MDD (major depressive disorder), severe ([place])  Secondary Diagnoses: Principal Problem:   MDD (major depressive disorder), severe ([place])   Current Medications:  Current Facility-Administered Medications  Medication Dose Route Frequency Provider Last Rate Last Dose  . busPIRone (BUSPAR) tablet 5 mg  5 mg Oral BID Cobos, Myer Peer, MD   5 mg at 03/08/18 1723  . feeding supplement (ENSURE ENLIVE) (ENSURE ENLIVE) liquid 237 mL  237 mL Oral BID BM Cobos, Myer Peer, MD   237 mL at 03/08/18 2244  . glipiZIDE (GLUCOTROL) tablet 10 mg  10 mg Oral BID AC Rankin, Shuvon B, NP   10 mg at 03/09/18 0612  . guaiFENesin (ROBITUSSIN) 100 MG/5ML solution 200 mg  10 mL Oral Q4H PRN Rankin, Shuvon B, NP      . Influenza vac split quadrivalent PF (FLUARIX) injection 0.5 mL  0.5 mL Intramuscular Tomorrow-1000 Cobos, Myer Peer, MD   Stopped at 03/04/18 1005  . insulin aspart (novoLOG) injection 0-15 Units  0-15 Units Subcutaneous TID WC Connye Burkitt, NP   3 Units at 03/09/18 0340  . insulin aspart (novoLOG) injection 0-5 Units  0-5 Units Subcutaneous QHS Connye Burkitt, NP   3 Units at 03/07/18 2235  . lisinopril (PRINIVIL,ZESTRIL) tablet 10 mg  10 mg Oral Daily Cobos, Myer Peer, MD   Stopped at 03/08/18 [phone]  . metFORMIN (GLUCOPHAGE) tablet 1,000 mg  1,000 mg Oral BID Rankin, Shuvon B, NP   1,000 mg at 03/08/18 1723  . QUEtiapine (SEROQUEL) tablet 100 mg  100 mg Oral QHS Cobos, Myer Peer, MD   100 mg at 03/08/18 2239  . QUEtiapine (SEROQUEL) tablet 25 mg  25 mg Oral BID Cobos, Myer Peer, MD   25 mg at 03/08/18 1723  . venlafaxine XR (EFFEXOR-XR) 24 hr capsule 75 mg  75 mg Oral Q breakfast Rankin, Shuvon B, NP   75 mg at 03/08/18 8185   PTA Medications: Medications Prior to Admission  Medication Sig Dispense Refill Last Dose  . [DISCONTINUED] Blood Glucose  Monitoring Suppl (TRUE METRIX METER) w/Device KIT 1 Device by Does not apply route 3 (three) times daily. (Patient not taking: Reported on 03/02/2018) 1 kit 0 Not Taking at Unknown time  . [DISCONTINUED] Insulin Syringe-Needle U-100 31G X 5/16" 0.3 ML MISC 1 each by Does not apply route 4 (four) times daily -  before meals and at bedtime. (Patient not taking: Reported on 03/02/2018) 100 each 12 Not Taking at Unknown time  . [DISCONTINUED] TRUEPLUS LANCETS 28G MISC 1 each by Does not apply route 3 (three) times daily. (Patient not taking: Reported on 03/02/2018) 100 each 12 Not Taking at Unknown time    Patient Stressors: Financial difficulties Health problems Medication change or noncompliance  Patient Strengths: Ability for insight Average or above average intelligence Capable of independent living General fund of knowledge Motivation for treatment/growth  Treatment Modalities: Medication Management, Group therapy, Case management,  1 to 1 session with clinician, Psychoeducation, Recreational therapy.   Physician Treatment Plan for Primary Diagnosis: MDD (major depressive disorder), severe ([place]) Long Term Goal(s): Improvement in symptoms so as ready for discharge Improvement in symptoms so as ready for discharge   Short Term Goals: Ability to identify changes in lifestyle to reduce recurrence of condition will improve Ability to verbalize feelings will improve Ability to disclose and discuss suicidal ideas Ability to demonstrate self-control will  improve Ability to identify and develop effective coping behaviors will improve Ability to identify triggers associated with substance abuse/mental health issues will improve  Medication Management: Evaluate patient's response, side effects, and tolerance of medication regimen.  Therapeutic Interventions: 1 to 1 sessions, Unit Group sessions and Medication administration.  Evaluation of Outcomes: Adequate for discharge  Physician Treatment  Plan for Secondary Diagnosis: Principal Problem:   MDD (major depressive disorder), severe ([place])  Long Term Goal(s): Improvement in symptoms so as ready for discharge Improvement in symptoms so as ready for discharge   Short Term Goals: Ability to identify changes in lifestyle to reduce recurrence of condition will improve Ability to verbalize feelings will improve Ability to disclose and discuss suicidal ideas Ability to demonstrate self-control will improve Ability to identify and develop effective coping behaviors will improve Ability to identify triggers associated with substance abuse/mental health issues will improve     Medication Management: Evaluate patient's response, side effects, and tolerance of medication regimen.  Therapeutic Interventions: 1 to 1 sessions, Unit Group sessions and Medication administration.  Evaluation of Outcomes:  Adequate for discharge  RN Treatment Plan for Primary Diagnosis: MDD (major depressive disorder), severe ([place]) Long Term Goal(s): Knowledge of disease and therapeutic regimen to maintain health will improve  Short Term Goals: Ability to remain free from injury will improve, Ability to verbalize feelings will improve, Ability to disclose and discuss suicidal ideas, Ability to identify and develop effective coping behaviors will improve and Compliance with prescribed medications will improve  Medication Management: RN will administer medications as ordered by provider, will assess and evaluate patient's response and provide education to patient for prescribed medication. RN will report any adverse and/or side effects to prescribing provider.  Therapeutic Interventions: 1 on 1 counseling sessions, Psychoeducation, Medication administration, Evaluate responses to treatment, Monitor vital signs and CBGs as ordered, Perform/monitor CIWA, COWS, AIMS and Fall Risk screenings as ordered, Perform wound care treatments as ordered.  Evaluation of Outcomes:   Adequate for discharge  LCSW Treatment Plan for Primary Diagnosis: MDD (major depressive disorder), severe ([place]) Long Term Goal(s): Safe transition to appropriate next level of care at discharge, Engage patient in therapeutic group addressing interpersonal concerns.  Short Term Goals: Engage patient in aftercare planning with referrals and resources, Increase social support, Increase emotional regulation, Identify triggers associated with mental health/substance abuse issues and Increase skills for wellness and recovery  Therapeutic Interventions: Assess for all discharge needs, 1 to 1 time with Social worker, Explore available resources and support systems, Assess for adequacy in community support network, Educate family and significant other(s) on suicide prevention, Complete Psychosocial Assessment, Interpersonal group therapy.  Evaluation of Outcomes:  Adequate for discharge  Progress in Treatment: Attending groups: Yes Participating in groups: Yes Taking medication as prescribed: Yes. Toleration medication: Yes. Family/Significant other contact made: SPE completd with pt; pt declined to consent to collateral contact.  Patient understands diagnosis: Yes. Discussing patient identified problems/goals with staff: Yes. Medical problems stabilized or resolved: Yes. Denies suicidal/homicidal ideation: Yes Issues/concerns per patient self-inventory: No.   New problem(s) identified: No, Describe:  CSW continuing to assess.  New Short Term/Long Term Goal(s): detox, medication management for mood stabilization; elimination of SI thoughts; development of comprehensive mental wellness/sobriety plan.  Patient Goals: "I need to de-stress myself." Manage depressive symptoms and reduce SI  Discharge Plan or Barriers: Pt plans to return home; follow-up at [facility]. Pt also provided with shelter resources as she states that her home is being  foreclosed on. [place] pamphlet, [place]  Crisis information, and AA/NA information provided to patient for additional community support and resources.   Reason for Continuation of Hospitalization: none  Estimated Length of Stay: Monday, 03/09/2018  Attendees: Patient:  03/09/2018 10:25 AM  Physician: Dr. Parke Poisson MD 03/09/2018 10:25 AM  Nursing: Yetta Flock RN; Demaris Callander 03/09/2018 10:25 AM  RN Care Manager:x 03/09/2018 10:25 AM  Social Worker: Janice Norrie LCSW 03/09/2018 10:25 AM  Recreational Therapist: x 03/09/2018 10:25 AM  Other: Lindell Spar NP; Harriett Sine NP 03/09/2018 10:25 AM  Other:  03/09/2018 10:25 AM  Other: 03/09/2018 10:25 AM    Scribe for Treatment Team: Avelina Laine, LCSW 03/09/2018 10:25 AM
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
## 2018-03-09 NOTE — Plan of Care (Signed)
Discharge note  Patient verbalizes readiness for discharge. Follow up plan explained, AVS, Transition record and SRA given. Prescriptions and teaching provided. Belongings returned and signed for. Suicide safety plan completed and signed. Patient verbalizes understanding. Patient denies SI/HI and assures this writer she will seek assistance should that change. Patient discharged to lobby with taxi voucher.  Problem: Education: Goal: Knowledge of [facility] Education information/materials will improve Outcome: Adequate for Discharge Goal: Emotional status will improve Outcome: Adequate for Discharge Goal: Mental status will improve Outcome: Adequate for Discharge Goal: Verbalization of understanding the information provided will improve Outcome: Adequate for Discharge   Problem: Activity: Goal: Interest or engagement in activities will improve Outcome: Adequate for Discharge Goal: Sleeping patterns will improve Outcome: Adequate for Discharge   Problem: Coping: Goal: Ability to verbalize frustrations and anger appropriately will improve Outcome: Adequate for Discharge Goal: Ability to demonstrate self-control will improve Outcome: Adequate for Discharge   Problem: Health Behavior/Discharge Planning: Goal: Identification of resources available to assist in meeting health care needs will improve Outcome: Adequate for Discharge Goal: Compliance with treatment plan for underlying cause of condition will improve Outcome: Adequate for Discharge   Problem: Physical Regulation: Goal: Ability to maintain clinical measurements within normal limits will improve Outcome: Adequate for Discharge   Problem: Safety: Goal: Periods of time without injury will increase Outcome: Adequate for Discharge   Problem: Education: Goal: Ability to make informed decisions regarding treatment will improve Outcome: Adequate for Discharge   Problem: Coping: Goal: Coping ability will  improve Outcome: Adequate for Discharge   Problem: Health Behavior/Discharge Planning: Goal: Identification of resources available to assist in meeting health care needs will improve Outcome: Adequate for Discharge   Problem: Medication: Goal: Compliance with prescribed medication regimen will improve Outcome: Adequate for Discharge   Problem: Self-Concept: Goal: Ability to disclose and discuss suicidal ideas will improve Outcome: Adequate for Discharge Goal: Will verbalize positive feelings about self Outcome: Adequate for Discharge   Problem: Education: Goal: Utilization of techniques to improve thought processes will improve Outcome: Adequate for Discharge Goal: Knowledge of the prescribed therapeutic regimen will improve Outcome: Adequate for Discharge   Problem: Activity: Goal: Interest or engagement in leisure activities will improve Outcome: Adequate for Discharge Goal: Imbalance in normal sleep/wake cycle will improve Outcome: Adequate for Discharge   Problem: Coping: Goal: Coping ability will improve Outcome: Adequate for Discharge Goal: Will verbalize feelings Outcome: Adequate for Discharge   Problem: Health Behavior/Discharge Planning: Goal: Ability to make decisions will improve Outcome: Adequate for Discharge Goal: Compliance with therapeutic regimen will improve Outcome: Adequate for Discharge   Problem: Role Relationship: Goal: Will demonstrate positive changes in social behaviors and relationships Outcome: Adequate for Discharge   Problem: Safety: Goal: Ability to disclose and discuss suicidal ideas will improve Outcome: Adequate for Discharge Goal: Ability to identify and utilize support systems that promote safety will improve Outcome: Adequate for Discharge   Problem: Self-Concept: Goal: Will verbalize positive feelings about self Outcome: Adequate for Discharge Goal: Level of anxiety will decrease Outcome: Adequate for Discharge   Problem:  Education: Goal: Knowledge of General Education information will improve Description Including pain rating scale, medication(s)/side effects and non-pharmacologic comfort measures Outcome: Adequate for Discharge   Problem: Health Behavior/Discharge Planning: Goal: Ability to manage health-related needs will improve Outcome: Adequate for Discharge   Problem: Coping: Goal: Level of anxiety will decrease Outcome: Adequate for Discharge   Problem: Safety: Goal: Ability to remain free from injury will improve Outcome: Adequate for  Discharge

## 2018-07-18 ENCOUNTER — Emergency Department (HOSPITAL_COMMUNITY): Payer: Medicaid Other

## 2018-07-18 ENCOUNTER — Emergency Department (HOSPITAL_COMMUNITY)
Admission: EM | Admit: 2018-07-18 | Discharge: 2018-07-19 | Disposition: A | Discharge status: Home / Self Care | Payer: Medicaid Other | Attending: Emergency Medicine | Admitting: Emergency Medicine

## 2018-07-18 ENCOUNTER — Encounter (HOSPITAL_COMMUNITY): Payer: Self-pay

## 2018-07-18 ENCOUNTER — Other Ambulatory Visit: Payer: Self-pay

## 2018-07-18 DIAGNOSIS — Z20828 Contact with and (suspected) exposure to other viral communicable diseases: Secondary | ICD-10-CM | POA: Diagnosis not present

## 2018-07-18 DIAGNOSIS — F430 Acute stress reaction: Secondary | ICD-10-CM | POA: Diagnosis not present

## 2018-07-18 DIAGNOSIS — E1165 Type 2 diabetes mellitus with hyperglycemia: Secondary | ICD-10-CM | POA: Diagnosis present

## 2018-07-18 DIAGNOSIS — R739 Hyperglycemia, unspecified: Secondary | ICD-10-CM

## 2018-07-18 DIAGNOSIS — Z79899 Other long term (current) drug therapy: Secondary | ICD-10-CM | POA: Insufficient documentation

## 2018-07-18 DIAGNOSIS — R030 Elevated blood-pressure reading, without diagnosis of hypertension: Secondary | ICD-10-CM | POA: Diagnosis not present

## 2018-07-18 DIAGNOSIS — Z794 Long term (current) use of insulin: Secondary | ICD-10-CM

## 2018-07-18 DIAGNOSIS — R4689 Other symptoms and signs involving appearance and behavior: Secondary | ICD-10-CM

## 2018-07-18 DIAGNOSIS — I10 Essential (primary) hypertension: Secondary | ICD-10-CM

## 2018-07-18 DIAGNOSIS — E0800 Diabetes mellitus due to underlying condition with hyperosmolarity without nonketotic hyperglycemic-hyperosmolar coma (NKHHC): Secondary | ICD-10-CM

## 2018-07-18 DIAGNOSIS — R5383 Other fatigue: Secondary | ICD-10-CM

## 2018-07-18 DIAGNOSIS — F29 Unspecified psychosis not due to a substance or known physiological condition: Secondary | ICD-10-CM | POA: Diagnosis not present

## 2018-07-18 LAB — CBC WITH DIFFERENTIAL/PLATELET
Abs Immature Granulocytes: 0.01 10*3/uL (ref 0.00–0.07)
Basophils Absolute: 0 10*3/uL (ref 0.0–0.1)
Basophils Relative: 1 %
Eosinophils Absolute: 0.1 10*3/uL (ref 0.0–0.5)
Eosinophils Relative: 3 %
HCT: 33.3 % — ABNORMAL LOW (ref 36.0–46.0)
Hemoglobin: 11.2 g/dL — ABNORMAL LOW (ref 12.0–15.0)
Immature Granulocytes: 0 %
Lymphocytes Relative: 20 %
Lymphs Abs: 1 10*3/uL (ref 0.7–4.0)
MCH: 28.6 pg (ref 26.0–34.0)
MCHC: 33.6 g/dL (ref 30.0–36.0)
MCV: 84.9 fL (ref 80.0–100.0)
Monocytes Absolute: 0.6 10*3/uL (ref 0.1–1.0)
Monocytes Relative: 13 %
Neutro Abs: 3.1 10*3/uL (ref 1.7–7.7)
Neutrophils Relative %: 63 %
Platelets: 274 10*3/uL (ref 150–400)
RBC: 3.92 MIL/uL (ref 3.87–5.11)
RDW: 13.3 % (ref 11.5–15.5)
WBC: 4.8 10*3/uL (ref 4.0–10.5)
nRBC: 0 % (ref 0.0–0.2)

## 2018-07-18 LAB — MAGNESIUM: Magnesium: 1.6 mg/dL — ABNORMAL LOW (ref 1.7–2.4)

## 2018-07-18 LAB — COMPREHENSIVE METABOLIC PANEL
ALT: 16 U/L (ref 0–44)
AST: 14 U/L — ABNORMAL LOW (ref 15–41)
Albumin: 3.1 g/dL — ABNORMAL LOW (ref 3.5–5.0)
Alkaline Phosphatase: 110 U/L (ref 38–126)
Anion gap: 5 (ref 5–15)
BUN: 17 mg/dL (ref 6–20)
CO2: 26 mmol/L (ref 22–32)
Calcium: 8.9 mg/dL (ref 8.9–10.3)
Chloride: 100 mmol/L (ref 98–111)
Creatinine, Ser: 1.24 mg/dL — ABNORMAL HIGH (ref 0.44–1.00)
GFR calc Af Amer: 56 mL/min — ABNORMAL LOW (ref >60)
GFR calc non Af Amer: 48 mL/min — ABNORMAL LOW (ref >60)
Glucose, Bld: 416 mg/dL — ABNORMAL HIGH (ref 70–99)
Potassium: 3.6 mmol/L (ref 3.5–5.1)
Sodium: 131 mmol/L — ABNORMAL LOW (ref 135–145)
Total Bilirubin: 1 mg/dL (ref 0.3–1.2)
Total Protein: 7.8 g/dL (ref 6.5–8.1)

## 2018-07-18 LAB — ACETAMINOPHEN LEVEL: Acetaminophen (Tylenol), Serum: <10 ug/mL — ABNORMAL LOW (ref 10–30)

## 2018-07-18 LAB — CBG MONITORING, ED
Glucose-Capillary: 431 mg/dL — ABNORMAL HIGH (ref 70–99)
Glucose-Capillary: 343 mg/dL — ABNORMAL HIGH (ref 70–99)

## 2018-07-18 LAB — ETHANOL: Alcohol, Ethyl (B): <10 mg/dL (ref <10)

## 2018-07-18 LAB — SALICYLATE LEVEL: Salicylate Lvl: <7 mg/dL (ref 2.8–30.0)

## 2018-07-18 MED ORDER — SODIUM CHLORIDE 0.9 % IV BOLUS
500.0000 mL | Freq: Once | INTRAVENOUS | Status: AC
  Administered 2018-07-19: 500 mL via INTRAVENOUS

## 2018-07-18 MED ORDER — STERILE WATER FOR INJECTION IJ SOLN
INTRAMUSCULAR | Status: AC
  Administered 2018-07-18: 2 mL
  Filled 2018-07-18: qty 10

## 2018-07-18 MED ORDER — INSULIN ASPART 100 UNIT/ML IV SOLN
10.0000 [IU] | Freq: Once | INTRAVENOUS | Status: AC
  Administered 2018-07-18: 10 [IU] via INTRAVENOUS

## 2018-07-18 MED ORDER — MAGNESIUM SULFATE 2 GM/50ML IV SOLN
2.0000 g | Freq: Once | INTRAVENOUS | Status: AC
  Administered 2018-07-18: 2 g via INTRAVENOUS
  Filled 2018-07-18: qty 50

## 2018-07-18 MED ORDER — POTASSIUM CHLORIDE CRYS ER 20 MEQ PO TBCR
40.0000 meq | EXTENDED_RELEASE_TABLET | Freq: Once | ORAL | Status: AC
  Administered 2018-07-19: 40 meq via ORAL
  Filled 2018-07-18: qty 2

## 2018-07-18 MED ORDER — ZIPRASIDONE MESYLATE 20 MG IM SOLR
20.0000 mg | Freq: Once | INTRAMUSCULAR | Status: AC
  Administered 2018-07-18: 20:00:00 20 mg via INTRAMUSCULAR
  Filled 2018-07-18: qty 20

## 2018-07-18 NOTE — ED Triage Notes (Signed)
Pt from home with PTAR initially for hypertension of 200/108 and hyperglycemia 456. Pt has hx of schizophrenia. Upon arrival to ED pt became very paranoid and wanting to leave. EDP at bedside, IVC to be completed

## 2018-07-18 NOTE — ED Notes (Signed)
Pt belongings inside locker number 2.

## 2018-07-19 DIAGNOSIS — R4689 Other symptoms and signs involving appearance and behavior: Secondary | ICD-10-CM

## 2018-07-19 DIAGNOSIS — F29 Unspecified psychosis not due to a substance or known physiological condition: Secondary | ICD-10-CM

## 2018-07-19 LAB — URINALYSIS, ROUTINE W REFLEX MICROSCOPIC
Bilirubin Urine: NEGATIVE
Glucose, UA: >=500 mg/dL — AB
Ketones, ur: NEGATIVE mg/dL
Leukocytes,Ua: NEGATIVE
Nitrite: NEGATIVE
Protein, ur: 100 mg/dL — AB
Specific Gravity, Urine: 1.027 (ref 1.005–1.030)
pH: 5 (ref 5.0–8.0)

## 2018-07-19 LAB — RAPID URINE DRUG SCREEN, HOSP PERFORMED
Amphetamines: NOT DETECTED
Barbiturates: NOT DETECTED
Benzodiazepines: NOT DETECTED
Cocaine: NOT DETECTED
Opiates: NOT DETECTED
Tetrahydrocannabinol: NOT DETECTED

## 2018-07-19 LAB — CBG MONITORING, ED
Glucose-Capillary: 158 mg/dL — ABNORMAL HIGH (ref 70–99)
Glucose-Capillary: 213 mg/dL — ABNORMAL HIGH (ref 70–99)

## 2018-07-19 LAB — SARS CORONAVIRUS 2 BY RT PCR (HOSPITAL ORDER, PERFORMED IN ~~LOC~~ HOSPITAL LAB): SARS Coronavirus 2: NEGATIVE

## 2018-07-19 MED ORDER — LISINOPRIL 10 MG PO TABS: 10.0000 mg | ORAL_TABLET | Freq: Every day | ORAL | 0 refills | Status: DC

## 2018-07-19 MED ORDER — GLUCOSE BLOOD VI STRP: ORAL_STRIP | 12 refills | Status: DC

## 2018-07-19 MED ORDER — TRUEPLUS LANCETS 28G MISC: 1.0000 | Freq: Three times a day (TID) | 12 refills | Status: DC

## 2018-07-19 MED ORDER — VENLAFAXINE HCL ER 75 MG PO CP24
75.0000 mg | ORAL_CAPSULE | Freq: Every day | ORAL | Status: DC
  Administered 2018-07-19: 09:00:00 75 mg via ORAL
  Filled 2018-07-19: qty 1

## 2018-07-19 MED ORDER — VENLAFAXINE HCL ER 75 MG PO CP24: 75.0000 mg | ORAL_CAPSULE | Freq: Every day | ORAL | 0 refills | Status: DC

## 2018-07-19 MED ORDER — GLIPIZIDE 10 MG PO TABS: 10.0000 mg | ORAL_TABLET | Freq: Two times a day (BID) | ORAL | 0 refills | Status: DC

## 2018-07-19 MED ORDER — BUSPIRONE HCL 5 MG PO TABS: 5.0000 mg | ORAL_TABLET | Freq: Two times a day (BID) | ORAL | 0 refills | Status: DC

## 2018-07-19 MED ORDER — METFORMIN HCL 1000 MG PO TABS: 1000.0000 mg | ORAL_TABLET | Freq: Two times a day (BID) | ORAL | 0 refills | Status: DC

## 2018-07-19 MED ORDER — QUETIAPINE FUMARATE 25 MG PO TABS: 25.0000 mg | ORAL_TABLET | Freq: Two times a day (BID) | ORAL | 0 refills | Status: DC

## 2018-07-19 MED ORDER — METFORMIN HCL 500 MG PO TABS
1000.0000 mg | ORAL_TABLET | Freq: Two times a day (BID) | ORAL | Status: DC
  Administered 2018-07-19: 1000 mg via ORAL
  Filled 2018-07-19: qty 2

## 2018-07-19 MED ORDER — QUETIAPINE FUMARATE 50 MG PO TABS
100.0000 mg | ORAL_TABLET | Freq: Every day | ORAL | Status: DC
  Administered 2018-07-19: 100 mg via ORAL
  Filled 2018-07-19: qty 2
  Filled 2018-07-19: qty 1

## 2018-07-19 MED ORDER — QUETIAPINE FUMARATE 25 MG PO TABS
25.0000 mg | ORAL_TABLET | Freq: Two times a day (BID) | ORAL | Status: DC
  Filled 2018-07-19: qty 1

## 2018-07-19 MED ORDER — LISINOPRIL 10 MG PO TABS
10.0000 mg | ORAL_TABLET | Freq: Every day | ORAL | Status: DC
  Administered 2018-07-19: 10 mg via ORAL
  Filled 2018-07-19: qty 1

## 2018-07-19 MED ORDER — GLIPIZIDE 10 MG PO TABS
10.0000 mg | ORAL_TABLET | Freq: Two times a day (BID) | ORAL | Status: DC
  Administered 2018-07-19: 10 mg via ORAL
  Filled 2018-07-19 (×2): qty 1

## 2018-07-19 MED ORDER — QUETIAPINE FUMARATE 100 MG PO TABS: 100.0000 mg | ORAL_TABLET | Freq: Every day | ORAL | 0 refills | Status: DC

## 2018-07-19 NOTE — ED Notes (Signed)
[place] to request for IVC papers to be served.

## 2018-07-19 NOTE — ED Notes (Signed)
Pt aware of delay w/obtaining meds. Per Mariann Laster, CM, Pharmacist advised meds should be ready by 1300. Pt voiced understanding and appreciation.

## 2018-07-19 NOTE — ED Notes (Signed)
IVC paperwork served by [name], original copy placed in red folder.
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
## 2018-07-19 NOTE — Progress Notes (Signed)
CSW faxed over information for [employer] [facility] the [place] [facility].   Chalmers Guest. Guerry Bruin, MSW, [place] Work/Disposition Phone: [phone] Fax: [phone]
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
## 2018-07-19 NOTE — ED Notes (Signed)
IVC papers served - Copy faxed to [facility] - Copy sent to Medical Records - Original placed in folder for [place] 3 sets on clipboard.

## 2018-07-19 NOTE — ED Notes (Signed)
Telepsych being performed. 

## 2018-07-19 NOTE — ED Notes (Signed)
TTS at bedside. 

## 2018-07-19 NOTE — ED Notes (Signed)
ORDERED A REG BREAKFAST NO SHARPS--Ayumi Wangerin

## 2018-07-19 NOTE — ED Notes (Signed)
Breakfast delivered  

## 2018-07-19 NOTE — ED Notes (Addendum)
D/C instructions given and questions answered to satisfaction - ALL belongings - 1 labeled belongings bag - returned to pt. Pt verified all items present. Pt given Home Meds that were obtained from pharmacy by Mariann Laster, [place]. Meds discussed and pt voiced understanding of how to take and stated she will check CBG as directed. Taxi Voucher given Garment/textile technologist escorted pt to lobby - pt declined w/c - Sitter to wait w/pt in lobby.

## 2018-07-19 NOTE — ED Notes (Signed)
Lunch tray delivered.

## 2018-07-19 NOTE — ED Notes (Signed)
Pt aware CM working on obtaining her meds for her then will be d/c'd. Pt voiced appreciation and understanding.

## 2018-07-19 NOTE — ED Notes (Signed)
IVC papers rescinded - Copy faxed to [place] to Medical Records - Original placed in folder for [employer].

## 2018-07-19 NOTE — ED Provider Notes (Signed)
Pt cleared by psychiatry.  No indication for admission.  Pt will need help with her meds as she has not been able to take her meds because of cost factors.  Will consult case management.  Anticipate dc once we get that arranged.   Dorie Rank, MD 07/19/18 [phone]

## 2018-07-19 NOTE — ED Provider Notes (Signed)
Psychiatric Default Provider Note:   Patient is a 58 year old female with past medical history as below presenting for hyper glycemia and paranoid thoughts.  Please see prior notes for full history and physical.  Past Medical History:  Diagnosis Date  . Anxiety   . Bipolar 1 disorder ([place])   . Depression   . Diabetes mellitus without complication ([place])   . Gallstones   . Hyperlipidemia   . Hypertension   . Neuropathy   . Schizophrenia ([place])   . Seizures ([place])    X1- febrile seizure as a child-none since   Patient is cleared by psychiatry today.  Case management was consulted to help with medication management.  Patient's medications were ordered and obtained for her.  Face-to-face order also placed for home health to assist with medication management.  On my evaluation, patient ambulatory, in good spirits, and with no active AVH.  Reviewed discharge instructions as well as medications with patient.  She is in understanding of her follow-up.  All questions answered.   Albesa Seen, PA-C 07/19/18 1434    Dorie Rank, MD 07/21/18 1530

## 2018-07-19 NOTE — ED Provider Notes (Signed)
12:14 AM Assumed care from Dr. Reather Converse, please see their note for full history, physical and decision making until this point. In brief this is a 58 y.o. year old female who presented to the ED tonight with Hyperglycemia; Hypertension; and Paranoid     Initially was here for hypertension hyperglycemia but then subsequently was found to be psychotic.  Has been involuntarily committed for that reason.  We are trying to get her blood sugars under control and blood pressures improved on home medications and stabilized for the likely need of inpatient psychiatric care.  Patient stable for TTS consultation but once again will need blood sugar stability prior to admission.  Home meds ordered. Will consult DM coordinator in AM.   Labs, studies and imaging reviewed by myself and considered in medical decision making if ordered. Imaging interpreted by radiology.  Labs Reviewed  COMPREHENSIVE METABOLIC PANEL - Abnormal; Notable for the following components:      Result Value   Sodium 131 (*)    Glucose, Bld 416 (*)    Creatinine, Ser 1.24 (*)    Albumin 3.1 (*)    AST 14 (*)    GFR calc non Af Amer 48 (*)    GFR calc Af Amer 56 (*)    All other components within normal limits  CBC WITH DIFFERENTIAL/PLATELET - Abnormal; Notable for the following components:   Hemoglobin 11.2 (*)    HCT 33.3 (*)    All other components within normal limits  ACETAMINOPHEN LEVEL - Abnormal; Notable for the following components:   Acetaminophen (Tylenol), Serum <10 (*)    All other components within normal limits  MAGNESIUM - Abnormal; Notable for the following components:   Magnesium 1.6 (*)    All other components within normal limits  CBG MONITORING, ED - Abnormal; Notable for the following components:   Glucose-Capillary 431 (*)    All other components within normal limits  CBG MONITORING, ED - Abnormal; Notable for the following components:   Glucose-Capillary 343 (*)    All other components within normal  limits  ETHANOL  SALICYLATE LEVEL  RAPID URINE DRUG SCREEN, HOSP PERFORMED  URINALYSIS, ROUTINE W REFLEX MICROSCOPIC    CT Head Wo Contrast  Final Result      No follow-ups on file.    Lorraine Turner, Corene Cornea, MD 07/19/18 [phone]

## 2018-07-19 NOTE — ED Notes (Signed)
Pt arrived to Rm 48 via stretcher. Pt ambulatory to bed. Pt noted to be wearing gown. Pt alert, oriented, calm, cooperative. Aware Telepsych to be performed soon. Sitter w/pt.

## 2018-07-19 NOTE — Care Management (Signed)
Medications picked up and delivered to patient prior to discharge.

## 2018-07-19 NOTE — ED Notes (Signed)
Spoke with case manager who is aware of patient and will provide plan of care shortly.

## 2018-07-19 NOTE — Care Management (Signed)
ED CM received consult concerning patient needing medication assistance.  Reviewed patient's record noted patient has [place] Medicaid and has been active in the past with [place].  CM met with patient who reported that she has not been taking her meds because she has not been able to afford the co-pays. CM discussed that under [place] Medicaid law medications for chronic conditions should be reported to the pharmacist and should not be withheld, patient states she was not aware.  Patient states, she may not be able to pick up medications until tomorrow because of transportation.  CM and patient discussed having prescription filled at [facility] on [address] she is agreeable.  CM will arrange for the medication to be picked up.  Updated      Paulla Fore PA-C to electronically send prescriptions there.  Contacted  [facility] they will notify CM when ready for pick up. Updated patient's new contact information.  CM will also make contact with [place] CM to reach out to arrange an e-visit appointment for follow up.

## 2018-07-19 NOTE — Discharge Instructions (Signed)
We have refilled your medications today. Please take as directed.  Please check your blood sugar 4 times a day and keep a record of this.  Please call [facility] tomorrow after 9 AM to schedule an emergency department follow-up visit.  Come back to the emergency department if your sugar continues to be 400s-500s, you experience abdominal pain, nausea, vomiting, confusion, or any new or worsening symptoms.  Please come back to the emergency department if you feel you are seeing or hearing things that you are not sure there.  Thank you for allowing [place] to participate in your care today.

## 2018-07-19 NOTE — ED Notes (Signed)
Lunch tray ordered 

## 2018-09-12 ENCOUNTER — Other Ambulatory Visit: Payer: Self-pay

## 2018-09-12 ENCOUNTER — Encounter (HOSPITAL_COMMUNITY): Payer: Self-pay | Admitting: Emergency Medicine

## 2018-09-12 ENCOUNTER — Observation Stay (HOSPITAL_COMMUNITY)
Admission: EM | Admit: 2018-09-12 | Discharge: 2018-09-13 | Disposition: A | Discharge status: Home / Self Care | Payer: Medicaid Other | Attending: Internal Medicine | Admitting: Internal Medicine

## 2018-09-12 DIAGNOSIS — E1110 Type 2 diabetes mellitus with ketoacidosis without coma: Secondary | ICD-10-CM | POA: Diagnosis present

## 2018-09-12 DIAGNOSIS — N179 Acute kidney failure, unspecified: Secondary | ICD-10-CM | POA: Diagnosis not present

## 2018-09-12 DIAGNOSIS — R739 Hyperglycemia, unspecified: Secondary | ICD-10-CM

## 2018-09-12 DIAGNOSIS — R45851 Suicidal ideations: Secondary | ICD-10-CM | POA: Insufficient documentation

## 2018-09-12 DIAGNOSIS — E0810 Diabetes mellitus due to underlying condition with ketoacidosis without coma: Secondary | ICD-10-CM

## 2018-09-12 DIAGNOSIS — F319 Bipolar disorder, unspecified: Secondary | ICD-10-CM | POA: Insufficient documentation

## 2018-09-12 DIAGNOSIS — F332 Major depressive disorder, recurrent severe without psychotic features: Secondary | ICD-10-CM

## 2018-09-12 DIAGNOSIS — Z794 Long term (current) use of insulin: Secondary | ICD-10-CM | POA: Insufficient documentation

## 2018-09-12 DIAGNOSIS — E1140 Type 2 diabetes mellitus with diabetic neuropathy, unspecified: Secondary | ICD-10-CM | POA: Insufficient documentation

## 2018-09-12 DIAGNOSIS — F4325 Adjustment disorder with mixed disturbance of emotions and conduct: Secondary | ICD-10-CM | POA: Diagnosis not present

## 2018-09-12 DIAGNOSIS — F251 Schizoaffective disorder, depressive type: Secondary | ICD-10-CM | POA: Insufficient documentation

## 2018-09-12 DIAGNOSIS — I10 Essential (primary) hypertension: Secondary | ICD-10-CM | POA: Diagnosis not present

## 2018-09-12 DIAGNOSIS — E785 Hyperlipidemia, unspecified: Secondary | ICD-10-CM | POA: Diagnosis not present

## 2018-09-12 DIAGNOSIS — R5383 Other fatigue: Secondary | ICD-10-CM

## 2018-09-12 DIAGNOSIS — F429 Obsessive-compulsive disorder, unspecified: Secondary | ICD-10-CM | POA: Insufficient documentation

## 2018-09-12 DIAGNOSIS — R4182 Altered mental status, unspecified: Secondary | ICD-10-CM | POA: Diagnosis not present

## 2018-09-12 DIAGNOSIS — Z79899 Other long term (current) drug therapy: Secondary | ICD-10-CM | POA: Insufficient documentation

## 2018-09-12 DIAGNOSIS — Z8249 Family history of ischemic heart disease and other diseases of the circulatory system: Secondary | ICD-10-CM | POA: Diagnosis not present

## 2018-09-12 DIAGNOSIS — F10929 Alcohol use, unspecified with intoxication, unspecified: Secondary | ICD-10-CM

## 2018-09-12 DIAGNOSIS — Z1159 Encounter for screening for other viral diseases: Secondary | ICD-10-CM | POA: Diagnosis not present

## 2018-09-12 DIAGNOSIS — E1165 Type 2 diabetes mellitus with hyperglycemia: Secondary | ICD-10-CM | POA: Diagnosis present

## 2018-09-12 DIAGNOSIS — Z87891 Personal history of nicotine dependence: Secondary | ICD-10-CM | POA: Insufficient documentation

## 2018-09-12 DIAGNOSIS — F10129 Alcohol abuse with intoxication, unspecified: Secondary | ICD-10-CM | POA: Diagnosis not present

## 2018-09-12 DIAGNOSIS — E7800 Pure hypercholesterolemia, unspecified: Secondary | ICD-10-CM | POA: Diagnosis not present

## 2018-09-12 DIAGNOSIS — E0800 Diabetes mellitus due to underlying condition with hyperosmolarity without nonketotic hyperglycemic-hyperosmolar coma (NKHHC): Secondary | ICD-10-CM

## 2018-09-12 HISTORY — DX: Acute kidney failure, unspecified: N17.9

## 2018-09-12 HISTORY — DX: Suicidal ideations: R45.851

## 2018-09-12 LAB — BASIC METABOLIC PANEL
Anion gap: 6 (ref 5–15)
BUN: 17 mg/dL (ref 6–20)
CO2: 26 mmol/L (ref 22–32)
Calcium: 8.8 mg/dL — ABNORMAL LOW (ref 8.9–10.3)
Chloride: 105 mmol/L (ref 98–111)
Creatinine, Ser: 1.2 mg/dL — ABNORMAL HIGH (ref 0.44–1.00)
GFR calc Af Amer: 58 mL/min — ABNORMAL LOW (ref >60)
GFR calc non Af Amer: 50 mL/min — ABNORMAL LOW (ref >60)
Glucose, Bld: 204 mg/dL — ABNORMAL HIGH (ref 70–99)
Potassium: 3.9 mmol/L (ref 3.5–5.1)
Sodium: 137 mmol/L (ref 135–145)

## 2018-09-12 LAB — COMPREHENSIVE METABOLIC PANEL WITH GFR
ALT: 14 U/L (ref 0–44)
AST: 19 U/L (ref 15–41)
Albumin: 3.6 g/dL (ref 3.5–5.0)
Alkaline Phosphatase: 129 U/L — ABNORMAL HIGH (ref 38–126)
Anion gap: 20 — ABNORMAL HIGH (ref 5–15)
BUN: 20 mg/dL (ref 6–20)
CO2: 18 mmol/L — ABNORMAL LOW (ref 22–32)
Calcium: 9.7 mg/dL (ref 8.9–10.3)
Chloride: 93 mmol/L — ABNORMAL LOW (ref 98–111)
Creatinine, Ser: 1.58 mg/dL — ABNORMAL HIGH (ref 0.44–1.00)
GFR calc Af Amer: 42 mL/min — ABNORMAL LOW
GFR calc non Af Amer: 36 mL/min — ABNORMAL LOW
Glucose, Bld: 489 mg/dL — ABNORMAL HIGH (ref 70–99)
Potassium: 4.1 mmol/L (ref 3.5–5.1)
Sodium: 131 mmol/L — ABNORMAL LOW (ref 135–145)
Total Bilirubin: 0.6 mg/dL (ref 0.3–1.2)
Total Protein: 8.6 g/dL — ABNORMAL HIGH (ref 6.5–8.1)

## 2018-09-12 LAB — RAPID URINE DRUG SCREEN, HOSP PERFORMED
Amphetamines: NOT DETECTED
Barbiturates: NOT DETECTED
Benzodiazepines: NOT DETECTED
Cocaine: NOT DETECTED
Opiates: NOT DETECTED
Tetrahydrocannabinol: NOT DETECTED

## 2018-09-12 LAB — CBC
HCT: 37.2 % (ref 36.0–46.0)
Hemoglobin: 12.3 g/dL (ref 12.0–15.0)
MCH: 28.5 pg (ref 26.0–34.0)
MCHC: 33.1 g/dL (ref 30.0–36.0)
MCV: 86.3 fL (ref 80.0–100.0)
Platelets: 337 10*3/uL (ref 150–400)
RBC: 4.31 MIL/uL (ref 3.87–5.11)
RDW: 13.4 % (ref 11.5–15.5)
WBC: 7 10*3/uL (ref 4.0–10.5)
nRBC: 0 % (ref 0.0–0.2)

## 2018-09-12 LAB — URINALYSIS, ROUTINE W REFLEX MICROSCOPIC
Bilirubin Urine: NEGATIVE
Glucose, UA: >=500 mg/dL — AB
Ketones, ur: NEGATIVE mg/dL
Leukocytes,Ua: NEGATIVE
Nitrite: NEGATIVE
Protein, ur: 30 mg/dL — AB
Specific Gravity, Urine: 1.012 (ref 1.005–1.030)
pH: 6 (ref 5.0–8.0)

## 2018-09-12 LAB — BASIC METABOLIC PANEL WITH GFR
Anion gap: 13 (ref 5–15)
Anion gap: 7 (ref 5–15)
BUN: 19 mg/dL (ref 6–20)
BUN: 18 mg/dL (ref 6–20)
CO2: 23 mmol/L (ref 22–32)
CO2: 25 mmol/L (ref 22–32)
Calcium: 9.3 mg/dL (ref 8.9–10.3)
Calcium: 8.7 mg/dL — ABNORMAL LOW (ref 8.9–10.3)
Chloride: 100 mmol/L (ref 98–111)
Chloride: 102 mmol/L (ref 98–111)
Creatinine, Ser: 1.34 mg/dL — ABNORMAL HIGH (ref 0.44–1.00)
Creatinine, Ser: 1.09 mg/dL — ABNORMAL HIGH (ref 0.44–1.00)
GFR calc Af Amer: 51 mL/min — ABNORMAL LOW
GFR calc Af Amer: >60 mL/min
GFR calc non Af Amer: 44 mL/min — ABNORMAL LOW
GFR calc non Af Amer: 56 mL/min — ABNORMAL LOW
Glucose, Bld: 303 mg/dL — ABNORMAL HIGH (ref 70–99)
Glucose, Bld: 300 mg/dL — ABNORMAL HIGH (ref 70–99)
Potassium: 4.2 mmol/L (ref 3.5–5.1)
Potassium: 4 mmol/L (ref 3.5–5.1)
Sodium: 136 mmol/L (ref 135–145)
Sodium: 134 mmol/L — ABNORMAL LOW (ref 135–145)

## 2018-09-12 LAB — GLUCOSE, CAPILLARY
Glucose-Capillary: 167 mg/dL — ABNORMAL HIGH (ref 70–99)
Glucose-Capillary: 193 mg/dL — ABNORMAL HIGH (ref 70–99)
Glucose-Capillary: 126 mg/dL — ABNORMAL HIGH (ref 70–99)
Glucose-Capillary: 114 mg/dL — ABNORMAL HIGH (ref 70–99)
Glucose-Capillary: 184 mg/dL — ABNORMAL HIGH (ref 70–99)
Glucose-Capillary: 261 mg/dL — ABNORMAL HIGH (ref 70–99)
Glucose-Capillary: 314 mg/dL — ABNORMAL HIGH (ref 70–99)
Glucose-Capillary: 140 mg/dL — ABNORMAL HIGH (ref 70–99)

## 2018-09-12 LAB — SALICYLATE LEVEL: Salicylate Lvl: <7 mg/dL (ref 2.8–30.0)

## 2018-09-12 LAB — HIV ANTIBODY (ROUTINE TESTING W REFLEX): HIV Screen 4th Generation wRfx: NONREACTIVE

## 2018-09-12 LAB — CBG MONITORING, ED
Glucose-Capillary: 419 mg/dL — ABNORMAL HIGH (ref 70–99)
Glucose-Capillary: 307 mg/dL — ABNORMAL HIGH (ref 70–99)
Glucose-Capillary: 189 mg/dL — ABNORMAL HIGH (ref 70–99)

## 2018-09-12 LAB — ACETAMINOPHEN LEVEL: Acetaminophen (Tylenol), Serum: <10 ug/mL — ABNORMAL LOW (ref 10–30)

## 2018-09-12 LAB — I-STAT BETA HCG BLOOD, ED (MC, WL, AP ONLY): I-stat hCG, quantitative: <5 m[IU]/mL

## 2018-09-12 LAB — HEMOGLOBIN A1C
Hgb A1c MFr Bld: 14.2 % — ABNORMAL HIGH (ref 4.8–5.6)
Mean Plasma Glucose: 360.84 mg/dL

## 2018-09-12 LAB — ETHANOL: Alcohol, Ethyl (B): 98 mg/dL — ABNORMAL HIGH

## 2018-09-12 LAB — SARS CORONAVIRUS 2 BY RT PCR (HOSPITAL ORDER, PERFORMED IN ~~LOC~~ HOSPITAL LAB): SARS Coronavirus 2: NEGATIVE

## 2018-09-12 MED ORDER — ADULT MULTIVITAMIN W/MINERALS CH
1.0000 | ORAL_TABLET | Freq: Every day | ORAL | Status: DC
  Administered 2018-09-12 – 2018-09-13 (×2): 1 via ORAL
  Filled 2018-09-12 (×2): qty 1

## 2018-09-12 MED ORDER — LORAZEPAM 2 MG/ML IJ SOLN: 2.0000 mg | INTRAMUSCULAR | Status: DC | PRN

## 2018-09-12 MED ORDER — INSULIN ASPART 100 UNIT/ML ~~LOC~~ SOLN: 0.0000 [IU] | Freq: Every day | SUBCUTANEOUS | Status: DC

## 2018-09-12 MED ORDER — THIAMINE HCL 100 MG/ML IJ SOLN
100.0000 mg | Freq: Every day | INTRAMUSCULAR | Status: DC
  Administered 2018-09-12: 08:00:00 100 mg via INTRAVENOUS
  Filled 2018-09-12: qty 2

## 2018-09-12 MED ORDER — INSULIN REGULAR(HUMAN) IN NACL 100-0.9 UT/100ML-% IV SOLN
INTRAVENOUS | Status: DC
  Administered 2018-09-12: 04:00:00 3.6 [IU]/h via INTRAVENOUS
  Filled 2018-09-12: qty 100

## 2018-09-12 MED ORDER — DEXTROSE-NACL 5-0.45 % IV SOLN: INTRAVENOUS | Status: DC

## 2018-09-12 MED ORDER — POTASSIUM CHLORIDE 10 MEQ/100ML IV SOLN
10.0000 meq | INTRAVENOUS | Status: AC
  Administered 2018-09-12 (×2): 10 meq via INTRAVENOUS
  Filled 2018-09-12 (×2): qty 100

## 2018-09-12 MED ORDER — VENLAFAXINE HCL ER 75 MG PO CP24
75.0000 mg | ORAL_CAPSULE | Freq: Every day | ORAL | Status: DC
  Administered 2018-09-13: 09:00:00 75 mg via ORAL
  Filled 2018-09-12: qty 1

## 2018-09-12 MED ORDER — INSULIN ASPART 100 UNIT/ML ~~LOC~~ SOLN
0.0000 [IU] | Freq: Three times a day (TID) | SUBCUTANEOUS | Status: DC
  Administered 2018-09-12: 18:00:00 7 [IU] via SUBCUTANEOUS
  Administered 2018-09-13: 12:00:00 5 [IU] via SUBCUTANEOUS

## 2018-09-12 MED ORDER — SODIUM CHLORIDE 0.9 % IV SOLN
INTRAVENOUS | Status: DC
  Administered 2018-09-12: 04:00:00 via INTRAVENOUS

## 2018-09-12 MED ORDER — SODIUM CHLORIDE 0.9 % IV SOLN
INTRAVENOUS | Status: DC
  Administered 2018-09-12: 06:00:00 via INTRAVENOUS

## 2018-09-12 MED ORDER — ONDANSETRON HCL 4 MG/2ML IJ SOLN
4.0000 mg | Freq: Once | INTRAMUSCULAR | Status: AC
  Administered 2018-09-12: 05:00:00 4 mg via INTRAVENOUS
  Filled 2018-09-12: qty 2

## 2018-09-12 MED ORDER — FOLIC ACID 5 MG/ML IJ SOLN
1.0000 mg | Freq: Every day | INTRAMUSCULAR | Status: DC
  Administered 2018-09-12: 10:00:00 1 mg via INTRAVENOUS
  Filled 2018-09-12 (×2): qty 0.2

## 2018-09-12 MED ORDER — INSULIN REGULAR(HUMAN) IN NACL 100-0.9 UT/100ML-% IV SOLN: INTRAVENOUS | Status: DC

## 2018-09-12 MED ORDER — ENOXAPARIN SODIUM 40 MG/0.4ML ~~LOC~~ SOLN
40.0000 mg | Freq: Every day | SUBCUTANEOUS | Status: DC
  Filled 2018-09-12: qty 0.4

## 2018-09-12 MED ORDER — ZIPRASIDONE MESYLATE 20 MG IM SOLR
10.0000 mg | Freq: Once | INTRAMUSCULAR | Status: AC | PRN
  Administered 2018-09-12: 01:00:00 10 mg via INTRAMUSCULAR
  Filled 2018-09-12: qty 20

## 2018-09-12 MED ORDER — DEXTROSE-NACL 5-0.45 % IV SOLN
INTRAVENOUS | Status: DC
  Administered 2018-09-12: 06:00:00 via INTRAVENOUS

## 2018-09-12 MED ORDER — QUETIAPINE FUMARATE 25 MG PO TABS
100.0000 mg | ORAL_TABLET | Freq: Every day | ORAL | Status: DC
  Administered 2018-09-12: 23:00:00 100 mg via ORAL
  Filled 2018-09-12: qty 4

## 2018-09-12 MED ORDER — HYDRALAZINE HCL 20 MG/ML IJ SOLN: 5.0000 mg | INTRAMUSCULAR | Status: DC | PRN

## 2018-09-12 MED ORDER — INSULIN DETEMIR 100 UNIT/ML ~~LOC~~ SOLN
15.0000 [IU] | Freq: Two times a day (BID) | SUBCUTANEOUS | Status: DC
  Administered 2018-09-12 – 2018-09-13 (×3): 15 [IU] via SUBCUTANEOUS
  Filled 2018-09-12 (×4): qty 0.15
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
## 2018-09-12 NOTE — Progress Notes (Signed)
CSW was contacted by [facility] requested updated blood glucose levels and UDS. CSW notified nurse regarding this request.   Chalmers Guest. Guerry Bruin, MSW, [place] Work/Disposition Phone: [phone] Fax: [phone]
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
## 2018-09-12 NOTE — Progress Notes (Signed)
CSW acknowledges consult for Involuntary commitment. Pt was served with order on 09/11/2018 at 11:43 pm for 7 days. End date of order 09/17/2018.  CSW will continue to follow.  Reed Breech LCSWA [phone]
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
## 2018-09-12 NOTE — Progress Notes (Signed)
Patient meets criteria for inpatient treatment. No appropriate or available beds at [facility]. CSW faxed referrals to the following facilities for review:  [facility]  [facility]  [facility]   TTS will continue to seek bed placement.  Chalmers Guest. Guerry Bruin, MSW, [place] Work/Disposition Phone: [phone] Fax: [phone]
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
## 2018-09-12 NOTE — Progress Notes (Signed)
Patient seen and examined.  Noncompliant.  History of suicidal attempts.  Present to the emergency room with directing traffic on the road, not taking medications, wanting to die.  Found to have DKA.  Admitted and treated with insulin infusion, anion gap closed medically stabilized, will convert to subcu insulin.  She has done insulin in the past.  She will be going back on insulin and metformin on discharge.  Will uptitrate as needed.  Suicidal ideation: With history of suicidal attempt in the past.  Recent inpatient behavior admission on 02/2018.  Seen by psychiatry and counselor, discussed case with psychiatry.  They cleared her of flight risk and lethality.  Discontinue one-to-one observation.  Psychiatry cleared patient from suicide precautions.  She has history of anxiety and depression.  Suggested to resume Seroquel and Effexor that we will prescribe on discharge.  Social worker to provide resources and follow-up.  Patient to be discharged tomorrow if stable on insulin treatment, medications and outpatient follow-up.

## 2018-09-12 NOTE — Progress Notes (Signed)
Received patient from ED BP 138/66 (BP Location: Left Arm)   Pulse 65   Temp 98.5 F (36.9 C) (Oral)   Resp 18   Ht 5\' 2"  (1.575 m)   Wt 77.1 kg   SpO2 100%   BMI 31.09 kg/m  Patient easily arouseable and oriented.  Room prepared for suicide precautions, suicide sitter in at shift change and briefed on patient.  Patient is a IVC admit.  No belongings are with patient, her belongings are in the ED locker locked up.   Patient oriented to room, report given to oncoming RN Daneil Dan.

## 2018-09-12 NOTE — Progress Notes (Signed)
CSW received consult to rescind IVC on pt.  CSW faxed Notice of Commitment change to [employer].  CSW gave pt resources for outpatient facility.  No further needs noted.  CSW signing off.  Reed Breech LCSWA [phone]

## 2018-09-13 DIAGNOSIS — E0810 Diabetes mellitus due to underlying condition with ketoacidosis without coma: Secondary | ICD-10-CM | POA: Diagnosis not present

## 2018-09-13 LAB — GLUCOSE, CAPILLARY
Glucose-Capillary: 85 mg/dL (ref 70–99)
Glucose-Capillary: 278 mg/dL — ABNORMAL HIGH (ref 70–99)

## 2018-09-13 LAB — BASIC METABOLIC PANEL
Anion gap: 8 (ref 5–15)
BUN: 16 mg/dL (ref 6–20)
CO2: 27 mmol/L (ref 22–32)
Calcium: 9.7 mg/dL (ref 8.9–10.3)
Chloride: 103 mmol/L (ref 98–111)
Creatinine, Ser: 0.9 mg/dL (ref 0.44–1.00)
GFR calc Af Amer: >60 mL/min (ref >60)
GFR calc non Af Amer: >60 mL/min (ref >60)
Glucose, Bld: 106 mg/dL — ABNORMAL HIGH (ref 70–99)
Potassium: 3.8 mmol/L (ref 3.5–5.1)
Sodium: 138 mmol/L (ref 135–145)

## 2018-09-13 LAB — PHOSPHORUS: Phosphorus: 3.5 mg/dL (ref 2.5–4.6)

## 2018-09-13 LAB — MAGNESIUM: Magnesium: 2.1 mg/dL (ref 1.7–2.4)

## 2018-09-13 MED ORDER — GLUCOSE BLOOD VI STRP: ORAL_STRIP | 12 refills | Status: DC

## 2018-09-13 MED ORDER — TRUE METRIX METER W/DEVICE KIT: 1.0000 | PACK | Freq: Three times a day (TID) | 0 refills | Status: DC

## 2018-09-13 MED ORDER — FOLIC ACID 1 MG PO TABS
1.0000 mg | ORAL_TABLET | Freq: Every day | ORAL | Status: DC
  Administered 2018-09-13: 09:00:00 1 mg via ORAL
  Filled 2018-09-13: qty 1

## 2018-09-13 MED ORDER — VENLAFAXINE HCL ER 75 MG PO CP24: 75.0000 mg | ORAL_CAPSULE | Freq: Every day | ORAL | 0 refills | Status: DC

## 2018-09-13 MED ORDER — GLIPIZIDE 5 MG PO TABS: 5.0000 mg | ORAL_TABLET | Freq: Two times a day (BID) | ORAL | 0 refills | Status: DC

## 2018-09-13 MED ORDER — LISINOPRIL 10 MG PO TABS: 10.0000 mg | ORAL_TABLET | Freq: Every day | ORAL | 0 refills | Status: DC

## 2018-09-13 MED ORDER — QUETIAPINE FUMARATE 100 MG PO TABS: 100.0000 mg | ORAL_TABLET | Freq: Every day | ORAL | 0 refills | Status: DC

## 2018-09-13 MED ORDER — TRUEPLUS LANCETS 28G MISC: 1.0000 | Freq: Three times a day (TID) | 12 refills | Status: DC

## 2018-09-13 MED ORDER — GABAPENTIN 100 MG PO CAPS: 100.0000 mg | ORAL_CAPSULE | Freq: Three times a day (TID) | ORAL | 2 refills | Status: DC

## 2018-09-13 MED ORDER — VITAMIN B-1 100 MG PO TABS
100.0000 mg | ORAL_TABLET | Freq: Every day | ORAL | Status: DC
  Administered 2018-09-13: 100 mg via ORAL
  Filled 2018-09-13: qty 1

## 2018-09-13 MED ORDER — METFORMIN HCL 1000 MG PO TABS: 1000.0000 mg | ORAL_TABLET | Freq: Two times a day (BID) | ORAL | 0 refills | Status: DC

## 2018-09-13 MED ORDER — INSULIN DETEMIR 100 UNIT/ML ~~LOC~~ SOLN: 15.0000 [IU] | Freq: Every day | SUBCUTANEOUS | 11 refills | Status: DC

## 2018-09-13 NOTE — Discharge Summary (Signed)
Physician Discharge Summary  Lorraine Turner ZSW:[id number] DOB: 1960-11-04 DOA: 09/12/2018  PCP: [name], MD  Admit date: 09/12/2018 Discharge date: 09/13/2018  Admitted From: home  Disposition:  Home with family   Recommendations for Outpatient Follow-up:  1. Follow up with PCP in 1-2 weeks 2. Follow-up with psychiatry.  Home Health: Not applicable Equipment/Devices: Not applicable  Discharge Condition: Stable CODE STATUS: Full code Diet recommendation: Low carbohydrate diet  Brief/Interim Summary: 58 year old female with history of anxiety depression, adjustment disorder, type 2 diabetes, hypertension who was brought to the emergency room, she was found directing traffic on the road, not taking medications and stressed out and wanting to die.  In the emergency room, she was found with DKA.  She was also involuntary admitted to the hospital.  Discharge Diagnoses:  Principal Problem:   DKA (diabetic ketoacidoses) ([place]) Active Problems:   Essential hypertension   Type 2 diabetes mellitus with hyperglycemia (HCC)   Suicidal ideation   AKI (acute kidney injury) ([place])   Adjustment disorder with mixed disturbance of emotions and conduct  Diabetic ketoacidosis: Treated with insulin infusion.  Improved.  Electrolytes normalized.  Patient used to be on insulin and she had not taken any treatment for last 6 to 8 months.  A1c is 14. Very good response to treatment.  Resumed her long-acting insulin, glipizide and metformin.  All prescriptions were sent to her pharmacy.  Patient is going to monitor blood sugars at home.  This was discussed with patient's daughter also.  She is going to follow-up with [facility].  Suicidal ideation: Patient has history of psychiatric illnesses, depression.  Patient was initially involuntary committed because of severe medical condition. She was evaluated by counselor and psychiatrist.  Ultimately, patient denied any suicidal ideation or  plans.  Patient stated that she is stressed out and was too much to handle for her.  With thorough psychiatric evaluation, she was cleared by psychiatrist to be discharged home.  Recommended to resume her Effexor and Seroquel that was prescribed.  Patient also takes gabapentin that was sent to the pharmacy.  Patient did very good recovery.  Today she is alert oriented x4.  She is aware about all her conditions.  She is not any suicidal or homicidal.  Called and discussed with patient's daughter whom she lives with and all instructions were discussed and patient was safely discharged with family.  Discharge Instructions  Discharge Instructions    Diet - low sodium heart healthy   Complete by: As directed    Discharge instructions   Complete by: As directed    Take all your medicines as prescribed. Follow up at wellness clinic . Call for appointment.   Increase activity slowly   Complete by: As directed      Allergies as of 09/13/2018   No Known Allergies     Medication List    STOP taking these medications   busPIRone 5 MG tablet Commonly known as: BUSPAR     TAKE these medications   gabapentin 100 MG capsule Commonly known as: Neurontin Take 1 capsule (100 mg total) by mouth 3 (three) times daily.   glipiZIDE 5 MG tablet Commonly known as: GLUCOTROL Take 1 tablet (5 mg total) by mouth 2 (two) times daily before a meal. For diabetes e What changed:   medication strength  how much to take   glucose blood test strip Use as instructed   insulin detemir 100 UNIT/ML injection Commonly known as: LEVEMIR Inject 0.15 mLs (15  Units total) into the skin at bedtime.   lisinopril 10 MG tablet Commonly known as: ZESTRIL Take 1 tablet (10 mg total) by mouth daily. For high blood pressure   metFORMIN 1000 MG tablet Commonly known as: GLUCOPHAGE Take 1 tablet (1,000 mg total) by mouth 2 (two) times daily with a meal. For diabetes management What changed: when to take this    QUEtiapine 100 MG tablet Commonly known as: SEROQUEL Take 1 tablet (100 mg total) by mouth at bedtime. For mood control What changed: Another medication with the same name was removed. Continue taking this medication, and follow the directions you see here.   True Metrix Meter w/Device Kit 1 Device by Does not apply route 3 (three) times daily. For blood sugar checks   TRUEplus Lancets 28G Misc 1 each by Does not apply route 3 (three) times daily. For blood sugar checks   venlafaxine XR 75 MG 24 hr capsule Commonly known as: Effexor XR Take 1 capsule (75 mg total) by mouth daily with breakfast. For depression       No Known Allergies  Consultations:  Psychiatry   Procedures/Studies:  No results found.    Subjective: Patient was seen and examined.  Denies any complaints.  She wants to go home.  She will take medications and was asking to send all medications to her pharmacy.    Discharge Exam: Vitals:   09/13/18 0418 09/13/18 0923  BP: 140/65 139/67  Pulse: 61 61  Resp: 20 16  Temp: 98.8 F (37.1 C) 98.7 F (37.1 C)  SpO2: 100% 100%   Vitals:   09/12/18 2003 09/12/18 2333 09/13/18 0418 09/13/18 0923  BP: (!) 156/64 (!) 163/70 140/65 139/67  Pulse: 64 61 61 61  Resp: _0 Temp: 98.4 F (36.9 C) 98.4 F (36.9 C) 98.8 F (37.1 C) 98.7 F (37.1 C)  TempSrc: Oral Oral Oral Oral  SpO2: 100% 100% 100% 100%  Weight:      Height:        General: Pt is alert, awake, not in acute distress Cardiovascular: RRR, S1/S2 +, no rubs, no gallops Respiratory: CTA bilaterally, no wheezing, no rhonchi Abdominal: Soft, NT, ND, bowel sounds + Extremities: no edema, no cyanosis Denies any delusions or hallucinations.   The results of significant diagnostics from this hospitalization (including imaging, microbiology, ancillary and laboratory) are listed below for reference.     Microbiology: Recent Results (from the past 240 hour(s))  SARS Coronavirus 2  [facility] order, Performed in [place] hospital lab)     Status: None   Collection Time: 09/12/18  2:43 AM  Result Value Ref Range Status   SARS Coronavirus 2 NEGATIVE NEGATIVE Final    Comment: (NOTE) If result is NEGATIVE SARS-CoV-2 target nucleic acids are NOT DETECTED. The SARS-CoV-2 RNA is generally detectable in upper and lower  respiratory specimens during the acute phase of infection. The lowest  concentration of SARS-CoV-2 viral copies this assay can detect is 250  copies / mL. A negative result does not preclude SARS-CoV-2 infection  and should not be used as the sole basis for treatment or other  patient management decisions.  A negative result may occur with  improper specimen collection / handling, submission of specimen other  than nasopharyngeal swab, presence of viral mutation(s) within the  areas targeted by this assay, and inadequate number of viral copies  (<250 copies / mL). A negative result must be combined with clinical  observations, patient history, and  epidemiological information. If result is POSITIVE SARS-CoV-2 target nucleic acids are DETECTED. The SARS-CoV-2 RNA is generally detectable in upper and lower  respiratory specimens dur ing the acute phase of infection.  Positive  results are indicative of active infection with SARS-CoV-2.  Clinical  correlation with patient history and other diagnostic information is  necessary to determine patient infection status.  Positive results do  not rule out bacterial infection or co-infection with other viruses. If result is PRESUMPTIVE POSTIVE SARS-CoV-2 nucleic acids MAY BE PRESENT.   A presumptive positive result was obtained on the submitted specimen  and confirmed on repeat testing.  While 2019 novel coronavirus  (SARS-CoV-2) nucleic acids may be present in the submitted sample  additional confirmatory testing may be necessary for epidemiological  and / or clinical management purposes  to differentiate  between  SARS-CoV-2 and other Sarbecovirus currently known to infect humans.  If clinically indicated additional testing with an alternate test  methodology [phone]) is advised. The SARS-CoV-2 RNA is generally  detectable in upper and lower respiratory sp ecimens during the acute  phase of infection. The expected result is Negative. Fact Sheet for Patients:  StrictlyIdeas.no Fact Sheet for Healthcare Providers: BankingDealers.co.za This test is not yet approved or cleared by the [place] FDA and has been authorized for detection and/or diagnosis of SARS-CoV-2 by FDA under an Emergency Use Authorization (EUA).  This EUA will remain in effect (meaning this test can be used) for the duration of the COVID-19 declaration under Section 564(b)(1) of the Act, 21 U.S.C. section 360bbb-3(b)(1), unless the authorization is terminated or revoked sooner. Performed at [facility], [place] [address] [place], [place] [address]      Labs: BNP (last 3 results) No results for input(s): BNP in the last 8760 hours. Basic Metabolic Panel: Recent Labs  Lab 09/12/18 0029 09/12/18 0455 09/12/18 0724 09/12/18 1221 09/13/18 0419  NA 131* 136 137 134* 138  K 4.1 4.2 3.9 4.0 3.8  CL 93* 100 105 102 103  CO2 18* _0 GLUCOSE 489* 303* 204* 300* 106*  BUN _1 CREATININE 1.58* 1.34* 1.20* 1.09* 0.90  CALCIUM 9.7 9.3 8.8* 8.7* 9.7  MG  --   --   --   --  2.1  PHOS  --   --   --   --  3.5   Liver Function Tests: Recent Labs  Lab 09/12/18 0029  AST 19  ALT 14  ALKPHOS 129*  BILITOT 0.6  PROT 8.6*  ALBUMIN 3.6   No results for input(s): LIPASE, AMYLASE in the last 168 hours. No results for input(s): AMMONIA in the last 168 hours. CBC: Recent Labs  Lab 09/12/18 0029  WBC 7.0  HGB 12.3  HCT 37.2  MCV 86.3  PLT 337   Cardiac Enzymes: No results for input(s): CKTOTAL, CKMB, CKMBINDEX, TROPONINI in the last 168  hours. BNP: Invalid input(s): POCBNP CBG: Recent Labs  Lab 09/12/18 1104 09/12/18 1213 09/12/18 1704 09/12/18 2100 09/13/18 0759  GLUCAP 184* 261* 314* 140* 85   D-Dimer No results for input(s): DDIMER in the last 72 hours. Hgb A1c Recent Labs    09/12/18 0455  HGBA1C 14.2*   Lipid Profile No results for input(s): CHOL, HDL, LDLCALC, TRIG, CHOLHDL, LDLDIRECT in the last 72 hours. Thyroid function studies No results for input(s): TSH, T4TOTAL, T3FREE, THYROIDAB in the last 72 hours.  Invalid input(s): FREET3 Anemia work up No results for input(s):  VITAMINB12, FOLATE, FERRITIN, TIBC, IRON, RETICCTPCT in the last 72 hours. Urinalysis    Component Value Date/Time   COLORURINE YELLOW 09/12/2018 1703   APPEARANCEUR CLEAR 09/12/2018 1703   LABSPEC 1.012 09/12/2018 1703   PHURINE 6.0 09/12/2018 1703   GLUCOSEU >=500 (A) 09/12/2018 1703   HGBUR SMALL (A) 09/12/2018 1703   HGBUR negative 10/12/2008 1103   BILIRUBINUR NEGATIVE 09/12/2018 1703   BILIRUBINUR negative 10/03/2017 1635   BILIRUBINUR negative 03/10/2017 1459   KETONESUR NEGATIVE 09/12/2018 1703   PROTEINUR 30 (A) 09/12/2018 1703   UROBILINOGEN 0.2 10/03/2017 1635   UROBILINOGEN 1.0 09/20/2014 2319   NITRITE NEGATIVE 09/12/2018 1703   LEUKOCYTESUR NEGATIVE 09/12/2018 1703   Sepsis Labs Invalid input(s): PROCALCITONIN,  WBC,  LACTICIDVEN Microbiology Recent Results (from the past 240 hour(s))  SARS Coronavirus 2 [facility] order, Performed in [place] hospital lab)     Status: None   Collection Time: 09/12/18  2:43 AM  Result Value Ref Range Status   SARS Coronavirus 2 NEGATIVE NEGATIVE Final    Comment: (NOTE) If result is NEGATIVE SARS-CoV-2 target nucleic acids are NOT DETECTED. The SARS-CoV-2 RNA is generally detectable in upper and lower  respiratory specimens during the acute phase of infection. The lowest  concentration of SARS-CoV-2 viral copies this assay can detect is 250  copies / mL. A  negative result does not preclude SARS-CoV-2 infection  and should not be used as the sole basis for treatment or other  patient management decisions.  A negative result may occur with  improper specimen collection / handling, submission of specimen other  than nasopharyngeal swab, presence of viral mutation(s) within the  areas targeted by this assay, and inadequate number of viral copies  (<250 copies / mL). A negative result must be combined with clinical  observations, patient history, and epidemiological information. If result is POSITIVE SARS-CoV-2 target nucleic acids are DETECTED. The SARS-CoV-2 RNA is generally detectable in upper and lower  respiratory specimens dur ing the acute phase of infection.  Positive  results are indicative of active infection with SARS-CoV-2.  Clinical  correlation with patient history and other diagnostic information is  necessary to determine patient infection status.  Positive results do  not rule out bacterial infection or co-infection with other viruses. If result is PRESUMPTIVE POSTIVE SARS-CoV-2 nucleic acids MAY BE PRESENT.   A presumptive positive result was obtained on the submitted specimen  and confirmed on repeat testing.  While 2019 novel coronavirus  (SARS-CoV-2) nucleic acids may be present in the submitted sample  additional confirmatory testing may be necessary for epidemiological  and / or clinical management purposes  to differentiate between  SARS-CoV-2 and other Sarbecovirus currently known to infect humans.  If clinically indicated additional testing with an alternate test  methodology [phone]) is advised. The SARS-CoV-2 RNA is generally  detectable in upper and lower respiratory sp ecimens during the acute  phase of infection. The expected result is Negative. Fact Sheet for Patients:  StrictlyIdeas.no Fact Sheet for Healthcare Providers: BankingDealers.co.za This test is not  yet approved or cleared by the [place] FDA and has been authorized for detection and/or diagnosis of SARS-CoV-2 by FDA under an Emergency Use Authorization (EUA).  This EUA will remain in effect (meaning this test can be used) for the duration of the COVID-19 declaration under Section 564(b)(1) of the Act, 21 U.S.C. section 360bbb-3(b)(1), unless the authorization is terminated or revoked sooner. Performed at [facility], [place] [address] [place], [place]  [address]      Time coordinating discharge: 35 minutes  SIGNED:   Barb Merino, MD  [facility] 09/13/2018, 9:53 AM

## 2018-09-13 NOTE — Progress Notes (Signed)
[name] to be D/C'd home per MD order. Discussed with the patient and all questions fully answered. VVS, Skin clean, dry and intact without evidence of skin break down, no evidence of skin tears noted.  IV catheters discontinued intact. Sites without signs and symptoms of complications. Dressing and pressure applied.  An After Visit Summary was printed and given to the patient.  Patient ambulated off unit with staff, and D/C home via private auto.  Lorraine Turner  09/13/2018 1:22 PM

## 2018-10-16 ENCOUNTER — Encounter (HOSPITAL_COMMUNITY): Payer: Self-pay | Admitting: *Deleted

## 2018-10-16 ENCOUNTER — Other Ambulatory Visit: Payer: Self-pay

## 2018-10-16 ENCOUNTER — Emergency Department (HOSPITAL_COMMUNITY)
Admission: EM | Admit: 2018-10-16 | Discharge: 2018-10-16 | Disposition: A | Discharge status: Home / Self Care | Payer: Medicaid Other | Attending: Emergency Medicine | Admitting: Emergency Medicine

## 2018-10-16 DIAGNOSIS — R197 Diarrhea, unspecified: Secondary | ICD-10-CM | POA: Insufficient documentation

## 2018-10-16 DIAGNOSIS — Z87891 Personal history of nicotine dependence: Secondary | ICD-10-CM | POA: Diagnosis not present

## 2018-10-16 DIAGNOSIS — I10 Essential (primary) hypertension: Secondary | ICD-10-CM | POA: Insufficient documentation

## 2018-10-16 DIAGNOSIS — E119 Type 2 diabetes mellitus without complications: Secondary | ICD-10-CM | POA: Diagnosis not present

## 2018-10-16 DIAGNOSIS — Z79899 Other long term (current) drug therapy: Secondary | ICD-10-CM | POA: Insufficient documentation

## 2018-10-16 DIAGNOSIS — Z794 Long term (current) use of insulin: Secondary | ICD-10-CM | POA: Insufficient documentation

## 2018-10-16 LAB — I-STAT BETA HCG BLOOD, ED (MC, WL, AP ONLY): I-stat hCG, quantitative: <5 m[IU]/mL (ref <5)

## 2018-10-16 LAB — CBC
HCT: 33.4 % — ABNORMAL LOW (ref 36.0–46.0)
Hemoglobin: 10.9 g/dL — ABNORMAL LOW (ref 12.0–15.0)
MCH: 28.4 pg (ref 26.0–34.0)
MCHC: 32.6 g/dL (ref 30.0–36.0)
MCV: 87 fL (ref 80.0–100.0)
Platelets: 390 10*3/uL (ref 150–400)
RBC: 3.84 MIL/uL — ABNORMAL LOW (ref 3.87–5.11)
RDW: 13.6 % (ref 11.5–15.5)
WBC: 6.9 10*3/uL (ref 4.0–10.5)
nRBC: 0 % (ref 0.0–0.2)

## 2018-10-16 LAB — COMPREHENSIVE METABOLIC PANEL
ALT: 16 U/L (ref 0–44)
AST: 19 U/L (ref 15–41)
Albumin: 3.3 g/dL — ABNORMAL LOW (ref 3.5–5.0)
Alkaline Phosphatase: 96 U/L (ref 38–126)
Anion gap: 11 (ref 5–15)
BUN: 18 mg/dL (ref 6–20)
CO2: 27 mmol/L (ref 22–32)
Calcium: 9.5 mg/dL (ref 8.9–10.3)
Chloride: 101 mmol/L (ref 98–111)
Creatinine, Ser: 1.18 mg/dL — ABNORMAL HIGH (ref 0.44–1.00)
GFR calc Af Amer: 59 mL/min — ABNORMAL LOW (ref >60)
GFR calc non Af Amer: 51 mL/min — ABNORMAL LOW (ref >60)
Glucose, Bld: 127 mg/dL — ABNORMAL HIGH (ref 70–99)
Potassium: 3.7 mmol/L (ref 3.5–5.1)
Sodium: 139 mmol/L (ref 135–145)
Total Bilirubin: 0.5 mg/dL (ref 0.3–1.2)
Total Protein: 7.8 g/dL (ref 6.5–8.1)

## 2018-10-16 LAB — LIPASE, BLOOD: Lipase: 18 U/L (ref 11–51)

## 2018-10-16 MED ORDER — ONDANSETRON 4 MG PO TBDP: 4.0000 mg | ORAL_TABLET | Freq: Three times a day (TID) | ORAL | 0 refills | Status: DC | PRN

## 2018-10-16 MED ORDER — SODIUM CHLORIDE 0.9% FLUSH: 3.0000 mL | Freq: Once | INTRAVENOUS | Status: DC

## 2018-10-16 MED ORDER — LOPERAMIDE HCL 2 MG PO CAPS: 2.0000 mg | ORAL_CAPSULE | Freq: Four times a day (QID) | ORAL | 0 refills | Status: DC | PRN

## 2018-10-16 NOTE — ED Triage Notes (Signed)
Pt was seen here several weeks ago and was in DKA b/c she was not taking her lantus.  Prescribed Levemir.  Ever since then she has been vomiting and having diarrhea.  Pt has not vomited for several days, but she did have diarrhea 2 hours before coming.

## 2018-10-16 NOTE — Discharge Instructions (Signed)
If you develop dizziness, vomiting, vomiting blood, blood in your stools, abdominal pain, fever, or any other new/concerning symptoms then return to the ER.

## 2018-10-16 NOTE — ED Provider Notes (Signed)
[facility] EMERGENCY DEPARTMENT Provider Note   CSN: [id number] Arrival date & time: 10/16/18  1447     History   Chief Complaint Chief Complaint  Patient presents with  . Diarrhea    HPI Lorraine Turner is a 58 y.o. female.     HPI  58 year old female presents with diarrhea.  She states that since she was released at the beginning of August, she has had diarrhea.  Started a few hours after eating a hamburger she got from a restaurant.  Had vomiting as well.  Vomiting has gone away but the diarrhea has persisted.  It went from watery to more formed.  No abdominal pain but she feels bloating and belching.  She is wondering if the Levemir, which is a new medicine prescribed at the end of the last admission, is causing her symptoms.  No fevers.  The diarrhea is improving and it currently is about once, maybe twice per day.  Past Medical History:  Diagnosis Date  . Anxiety   . Bipolar 1 disorder ([place])   . Depression   . Diabetes mellitus without complication ([place])   . Gallstones   . Hyperlipidemia   . Hypertension   . Neuropathy   . Schizophrenia ([place])   . Seizures ([place])    X1- febrile seizure as a child-none since    Patient Active Problem List   Diagnosis Date Noted  . DKA (diabetic ketoacidoses) ([place]) 09/12/2018  . Suicidal ideation 09/12/2018  . AKI (acute kidney injury) ([place]) 09/12/2018  . Adjustment disorder with mixed disturbance of emotions and conduct   . MDD (major depressive disorder), severe ([place]) 03/03/2018  . Umbilical hernia with obstruction 11/24/2015  . Symptomatic cholelithiasis 11/24/2015  . Type 2 diabetes mellitus with hyperglycemia ([place])   . Schizoaffective disorder, depressive type ([place]) 08/14/2011  . UNSPECIFIED ANEMIA 01/23/2009  . Essential hypertension 10/12/2008  . HYPERCHOLESTEROLEMIA 01/01/2007  . PANIC DISORDER 01/01/2007  . Obsessive compulsive disorder 01/01/2007    Past Surgical History:  Procedure Laterality  Date  . CESAREAN SECTION  05/1998   X 1  . CHOLECYSTECTOMY N/A 03/27/2016   Procedure: LAPAROSCOPIC CHOLECYSTECTOMY;  Surgeon: Olean Ree, MD;  Location: [facility] ORS;  Service: General;  Laterality: N/A;  . TONSILLECTOMY AND ADENOIDECTOMY     age 9  . TUBAL LIGATION       OB History   No obstetric history on file.      Home Medications    Prior to Admission medications   Medication Sig Start Date End Date Taking? Authorizing Provider  Blood Glucose Monitoring Suppl (TRUE METRIX METER) w/Device KIT 1 Device by Does not apply route 3 (three) times daily. For blood sugar checks 09/13/18   Barb Merino, MD  gabapentin (NEURONTIN) 100 MG capsule Take 1 capsule (100 mg total) by mouth 3 (three) times daily. 09/13/18 12/12/18  Barb Merino, MD  glipiZIDE (GLUCOTROL) 5 MG tablet Take 1 tablet (5 mg total) by mouth 2 (two) times daily before a meal. For diabetes e 09/13/18 10/13/18  Barb Merino, MD  glucose blood test strip Use as instructed 09/13/18   Barb Merino, MD  insulin detemir (LEVEMIR) 100 UNIT/ML injection Inject 0.15 mLs (15 Units total) into the skin at bedtime. 09/13/18   Barb Merino, MD  lisinopril (ZESTRIL) 10 MG tablet Take 1 tablet (10 mg total) by mouth daily. For high blood pressure 09/13/18   Barb Merino, MD  loperamide (IMODIUM) 2 MG capsule Take 1 capsule (2 mg total)  by mouth 4 (four) times daily as needed for diarrhea or loose stools. 10/16/18   Sherwood Gambler, MD  metFORMIN (GLUCOPHAGE) 1000 MG tablet Take 1 tablet (1,000 mg total) by mouth 2 (two) times daily with a meal. For diabetes management 09/13/18   Barb Merino, MD  ondansetron (ZOFRAN ODT) 4 MG disintegrating tablet Take 1 tablet (4 mg total) by mouth every 8 (eight) hours as needed for nausea or vomiting. 10/16/18   Sherwood Gambler, MD  QUEtiapine (SEROQUEL) 100 MG tablet Take 1 tablet (100 mg total) by mouth at bedtime. For mood control 09/13/18   Barb Merino, MD  TRUEplus Lancets 28G MISC 1 each by Does not  apply route 3 (three) times daily. For blood sugar checks 09/13/18   Barb Merino, MD  venlafaxine XR (EFFEXOR XR) 75 MG 24 hr capsule Take 1 capsule (75 mg total) by mouth daily with breakfast. For depression 09/13/18   Barb Merino, MD    Family History Family History  Problem Relation Age of Onset  . Diabetes Mother   . Heart disease Mother   . Kidney disease Mother   . Stroke Mother   . Hyperlipidemia Mother   . Diabetes Maternal Aunt     Social History Social History   Tobacco Use  . Smoking status: Former Smoker    Types: Cigarettes    Quit date: 03/14/2013    Years since quitting: 5.5  . Smokeless tobacco: Never Used  . Tobacco comment: 1 cigarette1-2 times year, only when she gets stressed  Substance Use Topics  . Alcohol use: Yes    Alcohol/week: 1.0 standard drinks    Types: 1 Cans of beer per week    Comment: One can of beer every 2-3 months.   . Drug use: No     Allergies   Patient has no known allergies.   Review of Systems Review of Systems  Constitutional: Negative for fever.  Gastrointestinal: Positive for diarrhea and vomiting. Negative for abdominal pain.  All other systems reviewed and are negative.    Physical Exam Updated Vital Signs BP (!) 199/95 (BP Location: Right Arm)   Pulse 73   Temp 98.2 F (36.8 C) (Oral)   Resp 18   Ht '5\' 2"'  (1.575 m)   Wt 54.4 kg   SpO2 100%   BMI 21.95 kg/m   Physical Exam Vitals signs and nursing note reviewed.  Constitutional:      Appearance: She is well-developed.  HENT:     Head: Normocephalic and atraumatic.     Right Ear: External ear normal.     Left Ear: External ear normal.     Nose: Nose normal.  Eyes:     General:        Right eye: No discharge.        Left eye: No discharge.  Cardiovascular:     Rate and Rhythm: Normal rate and regular rhythm.     Heart sounds: Normal heart sounds.  Pulmonary:     Effort: Pulmonary effort is normal.     Breath sounds: Normal breath sounds.   Abdominal:     General: There is no distension.     Palpations: Abdomen is soft.     Tenderness: There is no abdominal tenderness.  Skin:    General: Skin is warm and dry.  Neurological:     Mental Status: She is alert.  Psychiatric:        Mood and Affect: Mood is not anxious.  ED Treatments / Results  Labs (all labs ordered are listed, but only abnormal results are displayed) Labs Reviewed  COMPREHENSIVE METABOLIC PANEL - Abnormal; Notable for the following components:      Result Value   Glucose, Bld 127 (*)    Creatinine, Ser 1.18 (*)    Albumin 3.3 (*)    GFR calc non Af Amer 51 (*)    GFR calc Af Amer 59 (*)    All other components within normal limits  CBC - Abnormal; Notable for the following components:   RBC 3.84 (*)    Hemoglobin 10.9 (*)    HCT 33.4 (*)    All other components within normal limits  LIPASE, BLOOD  URINALYSIS, ROUTINE W REFLEX MICROSCOPIC  I-STAT BETA HCG BLOOD, ED ([facility], [facility], [facility] ONLY)    EKG None  Radiology No results found.  Procedures Procedures (including critical care time)  Medications Ordered in ED Medications  sodium chloride flush (NS) 0.9 % injection 3 mL (has no administration in time range)     Initial Impression / Assessment and Plan / ED Course  I have reviewed the triage vital signs and the nursing notes.  Pertinent labs & imaging results that were available during my care of the patient were reviewed by me and considered in my medical decision making (see chart for details).        Patient is noted to be hypertensive though she states she did not take her blood pressure the most recent time because she is worried she would vomited up.  I will prescribe her Zofran.  Her labs are reassuring including her glucose.  Her creatinine slightly higher than before but overall pretty similar to her baseline.  Not indicative of acute kidney injury.  She otherwise has a benign abdominal exam.  Discussed that Levemir typically  would not cause the diarrhea although metformin would, though she has been on this a while.  I will give her symptom control and have her follow-up with her PCP for outpatient change in medications as necessary.  Final Clinical Impressions(s) / ED Diagnoses   Final diagnoses:  Diarrhea in adult patient    ED Discharge Orders         Ordered    ondansetron (ZOFRAN ODT) 4 MG disintegrating tablet  Every 8 hours PRN     10/16/18 1723    loperamide (IMODIUM) 2 MG capsule  4 times daily PRN     10/16/18 1723           Sherwood Gambler, MD 10/16/18 1751

## 2018-10-26 NOTE — Progress Notes (Signed)
Patient ID: Lorraine Turner, female   DOB: 07-Jul-1960, 58 y.o.   MRN: [id number]  Virtual Visit via Telephone Note  I connected with Lorraine Turner on 10/28/18 at  2:30 PM EDT by telephone and verified that I am speaking with the correct person using two identifiers.   I discussed the limitations, risks, security and privacy concerns of performing an evaluation and management service by telephone and the availability of in person appointments. I also discussed with the patient that there may be a patient responsible charge related to this service. The patient expressed understanding and agreed to proceed.  Patient location: home My Location:  [facility] office Persons on the call:  Me and the patient   History of Present Illness: After being seen in the ED for improving diarrhea 10/16/2018.  Diarrhea has resolved.  She does not take Levimir anymore and wants to go back to lantus.  Blood sugars running  About 200-250 or less.  She stopped levemir last week.  Says she needs lantus restarted.  She was told at the ED that Lorraine Turner shouldn't be causing her symptoms, but she is not willing to take it anymore.  Last A1C was 14.2 in August.  Denies polydipsia/polyuria.  Glucose at hospital 10/16/2018=127.  BP=132/69   from ED note:  Patient is noted to be hypertensive though she states she did not take her blood pressure the most recent time because she is worried she would vomited up. I will prescribe her Zofran.  Her labs are reassuring including her glucose.  Her creatinine slightly higher than before but overall pretty similar to her baseline.  Not indicative of acute kidney injury.  She otherwise has a benign abdominal exam.  Discussed that Levemir typically would not cause the diarrhea although metformin would, though she has been on this a while.  I will give her symptom control and have her follow-up with her PCP for outpatient change in medications as necessary.     Observations/Objective:  A&Ox3.   Patient has studder but speech is clear   Assessment and Plan:   1. Type 2 diabetes mellitus with hyperglycemia, with long-term current use of insulin (HCC) Uncontrolled but improving.  Resume lantus and continue other medications - glucose blood test strip; Use as instructed  Dispense: 100 each; Refill: 12 - insulin glargine (LANTUS) 100 UNIT/ML injection; Inject 0.15 mLs (15 Units total) into the skin at bedtime.  Dispense: 10 mL; Refill: 11 - Insulin Syringe-Needle U-100 (SAFETY INSULIN SYRINGES) 30G X 5/16" 0.5 ML MISC; 1 each by Does not apply route at bedtime.  Dispense: 100 each; Refill: 3  2. Essential hypertension Continue current regimen  3. Encounter for examination following treatment at hospital Improving overall   Follow Up Instructions: 2 month follow-up with PCP (unless fasting CBG consistently higher than 150 then schedule sooner)   I discussed the assessment and treatment plan with the patient. The patient was provided an opportunity to ask questions and all were answered. The patient agreed with the plan and demonstrated an understanding of the instructions.   The patient was advised to call back or seek an in-person evaluation if the symptoms worsen or if the condition fails to improve as anticipated.  I provided 14 minutes of non-face-to-face time during this encounter.   Freeman Caldron, PA-C

## 2018-10-28 ENCOUNTER — Ambulatory Visit: Payer: Medicaid Other | Attending: Family Medicine | Admitting: Physician Assistant

## 2018-10-28 ENCOUNTER — Other Ambulatory Visit: Payer: Self-pay

## 2018-10-28 DIAGNOSIS — Z794 Long term (current) use of insulin: Secondary | ICD-10-CM | POA: Diagnosis not present

## 2018-10-28 DIAGNOSIS — E1165 Type 2 diabetes mellitus with hyperglycemia: Secondary | ICD-10-CM

## 2018-10-28 DIAGNOSIS — Z09 Encounter for follow-up examination after completed treatment for conditions other than malignant neoplasm: Secondary | ICD-10-CM

## 2018-10-28 DIAGNOSIS — I10 Essential (primary) hypertension: Secondary | ICD-10-CM

## 2018-10-28 MED ORDER — INSULIN GLARGINE 100 UNIT/ML ~~LOC~~ SOLN: 15.0000 [IU] | Freq: Every day | SUBCUTANEOUS | 11 refills | Status: DC

## 2018-10-28 MED ORDER — "INSULIN SYRINGE-NEEDLE U-100 30G X 5/16"" 0.5 ML MISC": 1.0000 | Freq: Every day | 3 refills | Status: DC

## 2018-10-28 MED ORDER — GLUCOSE BLOOD VI STRP: ORAL_STRIP | 12 refills | Status: DC

## 2018-12-28 ENCOUNTER — Ambulatory Visit: Payer: Medicaid Other | Admitting: Family Medicine

## 2019-01-12 ENCOUNTER — Ambulatory Visit: Payer: Medicaid Other | Admitting: Family Medicine

## 2019-01-13 ENCOUNTER — Other Ambulatory Visit: Payer: Self-pay

## 2019-01-13 ENCOUNTER — Ambulatory Visit: Payer: Medicaid Other | Attending: Family Medicine | Admitting: Family Medicine

## 2019-01-13 ENCOUNTER — Encounter: Payer: Self-pay | Admitting: Family Medicine

## 2019-01-13 VITALS — BP 188/90 | HR 72 | Temp 98.0°F | Ht 62.0 in | Wt 135.0 lb

## 2019-01-13 DIAGNOSIS — E1165 Type 2 diabetes mellitus with hyperglycemia: Secondary | ICD-10-CM | POA: Diagnosis not present

## 2019-01-13 DIAGNOSIS — N951 Menopausal and female climacteric states: Secondary | ICD-10-CM | POA: Diagnosis not present

## 2019-01-13 DIAGNOSIS — Z794 Long term (current) use of insulin: Secondary | ICD-10-CM | POA: Diagnosis not present

## 2019-01-13 DIAGNOSIS — I10 Essential (primary) hypertension: Secondary | ICD-10-CM

## 2019-01-13 LAB — GLUCOSE, POCT (MANUAL RESULT ENTRY): POC Glucose: 146 mg/dl — AB (ref 70–99)

## 2019-01-13 MED ORDER — VENLAFAXINE HCL ER 75 MG PO CP24: 75.0000 mg | ORAL_CAPSULE | Freq: Every day | ORAL | 6 refills | Status: DC

## 2019-01-13 MED ORDER — LISINOPRIL 20 MG PO TABS: 20.0000 mg | ORAL_TABLET | Freq: Every day | ORAL | 6 refills | Status: DC

## 2019-01-13 MED ORDER — ATORVASTATIN CALCIUM 20 MG PO TABS: 20.0000 mg | ORAL_TABLET | Freq: Every day | ORAL | 3 refills | Status: DC

## 2019-01-13 MED ORDER — CLONIDINE HCL 0.1 MG PO TABS
0.1000 mg | ORAL_TABLET | Freq: Once | ORAL | Status: AC
  Administered 2019-01-13: 0.1 mg via ORAL

## 2019-01-13 MED ORDER — GLIPIZIDE 10 MG PO TABS: 10.0000 mg | ORAL_TABLET | Freq: Two times a day (BID) | ORAL | 6 refills | Status: DC

## 2019-01-13 NOTE — Progress Notes (Signed)
Subjective:  Patient ID: Lorraine Turner, female    DOB: 27-Sep-1960  Age: 58 y.o. MRN: [id number]  CC: Hypertension   HPI Lorraine Turner  is a 58 year old female with a history of schizoaffective affective disorder, hypertension, diabetic neuropathy, type 2 diabetes mellitus (A1c 14.2) who presents today for a follow-up visit.  She has been out of Lisinopril for 3 weeks hence her elevated blood pressure and she denies presence of blurry vision, chest pain, dyspnea.  Clonidine 0.1 mg administered and patient observed for 30 minutes prior to repeating blood pressure.  She is concerned about Metformin recall and would like to come off it but has been compliant with glipizide and Lantus.  For her neuropathy she is doing well on gabapentin. She has no additional concerns today.  Past Medical History:  Diagnosis Date  . Anxiety   . Bipolar 1 disorder ([place])   . Depression   . Diabetes mellitus without complication ([place])   . Gallstones   . Hyperlipidemia   . Hypertension   . Neuropathy   . Schizophrenia ([place])   . Seizures ([place])    X1- febrile seizure as a child-none since    Past Surgical History:  Procedure Laterality Date  . CESAREAN SECTION  05/1998   X 1  . CHOLECYSTECTOMY N/A 03/27/2016   Procedure: LAPAROSCOPIC CHOLECYSTECTOMY;  Surgeon: Olean Ree, MD;  Location: [facility] ORS;  Service: General;  Laterality: N/A;  . TONSILLECTOMY AND ADENOIDECTOMY     age 76  . TUBAL LIGATION      Family History  Problem Relation Age of Onset  . Diabetes Mother   . Heart disease Mother   . Kidney disease Mother   . Stroke Mother   . Hyperlipidemia Mother   . Diabetes Maternal Aunt     No Known Allergies  Outpatient Medications Prior to Visit  Medication Sig Dispense Refill  . glucose blood test strip Use as instructed 100 each 12  . insulin glargine (LANTUS) 100 UNIT/ML injection Inject 0.15 mLs (15 Units total) into the skin at bedtime. 10 mL 11  . Insulin Syringe-Needle  U-100 (SAFETY INSULIN SYRINGES) 30G X 5/16" 0.5 ML MISC 1 each by Does not apply route at bedtime. 100 each 3  . loperamide (IMODIUM) 2 MG capsule Take 1 capsule (2 mg total) by mouth 4 (four) times daily as needed for diarrhea or loose stools. 12 capsule 0  . ondansetron (ZOFRAN ODT) 4 MG disintegrating tablet Take 1 tablet (4 mg total) by mouth every 8 (eight) hours as needed for nausea or vomiting. 10 tablet 0  . QUEtiapine (SEROQUEL) 100 MG tablet Take 1 tablet (100 mg total) by mouth at bedtime. For mood control 30 tablet 0  . TRUEplus Lancets 28G MISC 1 each by Does not apply route 3 (three) times daily. For blood sugar checks 100 each 12  . venlafaxine XR (EFFEXOR XR) 75 MG 24 hr capsule Take 1 capsule (75 mg total) by mouth daily with breakfast. For depression 60 capsule 0  . Blood Glucose Monitoring Suppl (TRUE METRIX METER) w/Device KIT 1 Device by Does not apply route 3 (three) times daily. For blood sugar checks 1 kit 0  . gabapentin (NEURONTIN) 100 MG capsule Take 1 capsule (100 mg total) by mouth 3 (three) times daily. 90 capsule 2  . glipiZIDE (GLUCOTROL) 5 MG tablet Take 1 tablet (5 mg total) by mouth 2 (two) times daily before a meal. For diabetes e 60 tablet 0  . lisinopril (  ZESTRIL) 10 MG tablet Take 1 tablet (10 mg total) by mouth daily. For high blood pressure (Patient not taking: Reported on 01/13/2019) 30 tablet 0  . metFORMIN (GLUCOPHAGE) 1000 MG tablet Take 1 tablet (1,000 mg total) by mouth 2 (two) times daily with a meal. For diabetes management (Patient not taking: Reported on 01/13/2019) 60 tablet 0   No facility-administered medications prior to visit.      ROS Review of Systems  Constitutional: Negative for activity change, appetite change and fatigue.  HENT: Negative for congestion, sinus pressure and sore throat.   Eyes: Negative for visual disturbance.  Respiratory: Negative for cough, chest tightness, shortness of breath and wheezing.   Cardiovascular: Negative  for chest pain and palpitations.  Gastrointestinal: Negative for abdominal distention, abdominal pain and constipation.  Endocrine: Negative for polydipsia.  Genitourinary: Negative for dysuria and frequency.  Musculoskeletal: Negative for arthralgias and back pain.  Skin: Negative for rash.  Neurological: Negative for tremors, light-headedness and numbness.  Hematological: Does not bruise/bleed easily.  Psychiatric/Behavioral: Negative for agitation and behavioral problems.    Objective:  BP (!) 210/90   Pulse 72   Temp 98 F (36.7 C) (Oral)   Ht _0  (1.575 m)   Wt 135 lb (61.2 kg)   SpO2 100%   BMI 24.69 kg/m   BP/Weight 01/13/2019 08/19/2954 03/14/3084  Systolic BP 578 469 629  Diastolic BP 90 69 79  Wt. (Lbs) 135 120 -  BMI 24.69 21.95 -  Some encounter information is confidential and restricted. Go to Review Flowsheets activity to see all data.      Physical Exam Constitutional:      Appearance: She is well-developed.  Neck:     Vascular: No JVD.  Cardiovascular:     Rate and Rhythm: Normal rate.     Heart sounds: Normal heart sounds. No murmur.  Pulmonary:     Effort: Pulmonary effort is normal.     Breath sounds: Normal breath sounds. No wheezing or rales.  Chest:     Chest wall: No tenderness.  Abdominal:     General: Bowel sounds are normal. There is no distension.     Palpations: Abdomen is soft. There is no mass.     Tenderness: There is no abdominal tenderness.  Musculoskeletal: Normal range of motion.     Right lower leg: No edema.     Left lower leg: No edema.  Neurological:     Mental Status: She is alert and oriented to person, place, and time.  Psychiatric:        Mood and Affect: Mood normal.     CMP Latest Ref Rng & Units 10/16/2018 09/13/2018 09/12/2018  Glucose 70 - 99 mg/dL 127(H) 106(H) 300(H)  BUN 6 - 20 mg/dL _1 Creatinine 0.44 - 1.00 mg/dL 1.18(H) 0.90 1.09(H)  Sodium 135 - 145 mmol/L 139 138 134(L)  Potassium 3.5 - 5.1 mmol/L  3.7 3.8 4.0  Chloride 98 - 111 mmol/L 101 103 102  CO2 22 - 32 mmol/L _2 Calcium 8.9 - 10.3 mg/dL 9.5 9.7 8.7(L)  Total Protein 6.5 - 8.1 g/dL 7.8 - -  Total Bilirubin 0.3 - 1.2 mg/dL 0.5 - -  Alkaline Phos 38 - 126 U/L 96 - -  AST 15 - 41 U/L 19 - -  ALT 0 - 44 U/L 16 - -    Lipid Panel     Component Value Date/Time   CHOL 182 03/05/2018 0635  TRIG 71 03/05/2018 0635   HDL 47 03/05/2018 0635   CHOLHDL 3.9 03/05/2018 0635   VLDL 14 03/05/2018 0635   LDLCALC 121 (H) 03/05/2018 0635    CBC    Component Value Date/Time   WBC 6.9 10/16/2018 1502   RBC 3.84 (L) 10/16/2018 1502   HGB 10.9 (L) 10/16/2018 1502   HGB 13.2 03/10/2017 1633   HCT 33.4 (L) 10/16/2018 1502   HCT 39.0 03/10/2017 1633   PLT 390 10/16/2018 1502   PLT 300 03/10/2017 1633   MCV 87.0 10/16/2018 1502   MCV 88 03/10/2017 1633   MCH 28.4 10/16/2018 1502   MCHC 32.6 10/16/2018 1502   RDW 13.6 10/16/2018 1502   RDW 13.7 03/10/2017 1633   LYMPHSABS 1.0 07/18/2018 2001   LYMPHSABS 1.8 03/10/2017 1633   MONOABS 0.6 07/18/2018 2001   EOSABS 0.1 07/18/2018 2001   EOSABS 0.1 03/10/2017 1633   BASOSABS 0.0 07/18/2018 2001   BASOSABS 0.0 03/10/2017 1633    Lab Results  Component Value Date   HGBA1C 14.2 (H) 09/12/2018    Assessment & Plan:   1. Type 2 diabetes mellitus with hyperglycemia, with long-term current use of insulin (HCC) Uncontrolled with A1c of 14.2; goal is less than 7 We will send of A1c and adjust regimen accordingly Discontinue Metformin due to concerns about recall and glipizide dose has been increased Counseled on Diabetic diet, my plate method, 811 minutes of moderate intensity exercise/week Blood sugar logs with fasting goals of 80-120 mg/dl, random of less than 180 and in the event of sugars less than 60 mg/dl or greater than 400 mg/dl encouraged to notify the clinic. Advised on the need for annual eye exams, annual foot exams, Pneumonia vaccine. - Glucose (CBG) - Hemoglobin  A1c(lab) - glipiZIDE (GLUCOTROL) 10 MG tablet; Take 1 tablet (10 mg total) by mouth 2 (two) times daily before a meal.  Dispense: 60 tablet; Refill: 6 - Lipid panel - Microalbumin/Creatinine Ratio, Urine  2. Essential hypertension Uncontrolled due to running out of lisinopril Clonidine 0.1 mg administered in the clinic and blood pressure repeated after.  Advised of sedating side effects of clonidine - lisinopril (ZESTRIL) 20 MG tablet; Take 1 tablet (20 mg total) by mouth daily. For high blood pressure  Dispense: 30 tablet; Refill: 6 - cloNIDine (CATAPRES) tablet 0.1 mg  3.  Menopausal state With mood disorder - venlafaxine XR (EFFEXOR XR) 75 MG 24 hr capsule; Take 1 capsule (75 mg total) by mouth daily with breakfast. For depression  Dispense: 60 capsule; Refill: 6   Return in about 1 month (around 02/13/2019) for complete physical.     [name], MD, FAAFP. [facility] and [place], [place]   01/13/2019, 2:19 PM

## 2019-01-13 NOTE — Progress Notes (Signed)
Patient wants to discuss metformin recall.

## 2019-01-14 ENCOUNTER — Other Ambulatory Visit: Payer: Self-pay | Admitting: Family Medicine

## 2019-01-14 LAB — LIPID PANEL
Chol/HDL Ratio: 3.9 ratio (ref 0.0–4.4)
Cholesterol, Total: 235 mg/dL — ABNORMAL HIGH (ref 100–199)
HDL: 61 mg/dL (ref >39)
LDL Chol Calc (NIH): 158 mg/dL — ABNORMAL HIGH (ref 0–99)
Triglycerides: 91 mg/dL (ref 0–149)
VLDL Cholesterol Cal: 16 mg/dL (ref 5–40)

## 2019-01-14 LAB — HEMOGLOBIN A1C
Est. average glucose Bld gHb Est-mCnc: 171 mg/dL
Hgb A1c MFr Bld: 7.6 % — ABNORMAL HIGH (ref 4.8–5.6)

## 2019-01-14 MED ORDER — ATORVASTATIN CALCIUM 40 MG PO TABS: 40.0000 mg | ORAL_TABLET | Freq: Every day | ORAL | 6 refills | Status: DC

## 2019-01-15 ENCOUNTER — Telehealth: Payer: Self-pay

## 2019-01-15 NOTE — Telephone Encounter (Signed)
Patient was called and a voicemail was left informing patient to return phone call for lab results. 

## 2019-01-15 NOTE — Telephone Encounter (Signed)
-----   Message from [name], MD sent at 01/14/2019  8:15 AM EST ----- Please inform her A1c is 7.6 which is great compared to 14.2 previously.  Continue current dose of Lantus 15 units and glipizide dose.  Cholesterol is elevated and I have increased her atorvastatin to 40 mg and will need her to comply with a low-cholesterol diet.

## 2019-01-15 NOTE — Telephone Encounter (Signed)
Patient returned phone call and was informed of lab results. 

## 2019-02-15 ENCOUNTER — Ambulatory Visit: Payer: Medicaid Other | Admitting: Family Medicine

## 2019-04-21 ENCOUNTER — Emergency Department (HOSPITAL_COMMUNITY)
Admission: EM | Admit: 2019-04-21 | Discharge: 2019-04-21 | Disposition: A | Discharge status: Home / Self Care | Payer: Medicaid Other | Attending: Emergency Medicine | Admitting: Emergency Medicine

## 2019-04-21 ENCOUNTER — Other Ambulatory Visit: Payer: Self-pay

## 2019-04-21 ENCOUNTER — Encounter (HOSPITAL_COMMUNITY): Payer: Self-pay | Admitting: Emergency Medicine

## 2019-04-21 DIAGNOSIS — Z87891 Personal history of nicotine dependence: Secondary | ICD-10-CM | POA: Insufficient documentation

## 2019-04-21 DIAGNOSIS — Y929 Unspecified place or not applicable: Secondary | ICD-10-CM | POA: Diagnosis not present

## 2019-04-21 DIAGNOSIS — I10 Essential (primary) hypertension: Secondary | ICD-10-CM | POA: Diagnosis not present

## 2019-04-21 DIAGNOSIS — S99921A Unspecified injury of right foot, initial encounter: Secondary | ICD-10-CM | POA: Diagnosis present

## 2019-04-21 DIAGNOSIS — Y9389 Activity, other specified: Secondary | ICD-10-CM | POA: Insufficient documentation

## 2019-04-21 DIAGNOSIS — R030 Elevated blood-pressure reading, without diagnosis of hypertension: Secondary | ICD-10-CM

## 2019-04-21 DIAGNOSIS — Z794 Long term (current) use of insulin: Secondary | ICD-10-CM | POA: Insufficient documentation

## 2019-04-21 DIAGNOSIS — Y998 Other external cause status: Secondary | ICD-10-CM | POA: Diagnosis not present

## 2019-04-21 DIAGNOSIS — S91311A Laceration without foreign body, right foot, initial encounter: Secondary | ICD-10-CM | POA: Insufficient documentation

## 2019-04-21 DIAGNOSIS — W25XXXA Contact with sharp glass, initial encounter: Secondary | ICD-10-CM | POA: Insufficient documentation

## 2019-04-21 DIAGNOSIS — E1140 Type 2 diabetes mellitus with diabetic neuropathy, unspecified: Secondary | ICD-10-CM | POA: Insufficient documentation

## 2019-04-21 DIAGNOSIS — Z79899 Other long term (current) drug therapy: Secondary | ICD-10-CM | POA: Diagnosis not present

## 2019-04-21 MED ORDER — BACITRACIN ZINC 500 UNIT/GM EX OINT
1.0000 "application " | TOPICAL_OINTMENT | Freq: Two times a day (BID) | CUTANEOUS | Status: DC
  Administered 2019-04-21: 1 via TOPICAL
  Filled 2019-04-21: qty 0.9

## 2019-04-21 MED ORDER — BACITRACIN ZINC 500 UNIT/GM EX OINT: 1.0000 "application " | TOPICAL_OINTMENT | Freq: Two times a day (BID) | CUTANEOUS | 0 refills | Status: DC

## 2019-04-21 NOTE — Discharge Instructions (Signed)
You need to apply the antibiotic ointment for the next week.  If you have redness or fever or discharge, you should return to the ER.  You need to follow-up with your doctor to discuss your high blood pressure.

## 2019-04-21 NOTE — ED Notes (Signed)
Recorded bp of 210/77, pt states not normally having bp this high. Pt also states "skipping out" on her lisinopril today. RN notified.

## 2019-04-21 NOTE — ED Provider Notes (Signed)
[facility] EMERGENCY DEPARTMENT Provider Note   CSN: [id number] Arrival date & time: 04/21/19  8366     History Chief Complaint  Patient presents with  . Leg Pain    Lorraine Turner is a 59 y.o. female.  Patient presents to the ED with a chief complaint of wound check.  She states that she cut her foot on some glass about a month ago.  The wound has been healing well.  She states that she looked closely at the wound and saw some discoloration which concerned her.  This prompted her to come to the ER for evaluation.  She denies any fever or chills.  Denies any streaking or redness or discharge.  Patient also reports that her BP has been running high, but has not been completely compliant with her BP meds.  She denies any other associated symptoms.  The history is provided by the patient. No language interpreter was used.       Past Medical History:  Diagnosis Date  . Anxiety   . Bipolar 1 disorder ([place])   . Depression   . Diabetes mellitus without complication ([place])   . Gallstones   . Hyperlipidemia   . Hypertension   . Neuropathy   . Schizophrenia ([place])   . Seizures ([place])    X1- febrile seizure as a child-none since    Patient Active Problem List   Diagnosis Date Noted  . DKA (diabetic ketoacidoses) ([place]) 09/12/2018  . Suicidal ideation 09/12/2018  . AKI (acute kidney injury) ([place]) 09/12/2018  . Adjustment disorder with mixed disturbance of emotions and conduct   . MDD (major depressive disorder), severe ([place]) 03/03/2018  . Umbilical hernia with obstruction 11/24/2015  . Symptomatic cholelithiasis 11/24/2015  . Type 2 diabetes mellitus with hyperglycemia ([place])   . Schizoaffective disorder, depressive type ([place]) 08/14/2011  . UNSPECIFIED ANEMIA 01/23/2009  . Essential hypertension 10/12/2008  . HYPERCHOLESTEROLEMIA 01/01/2007  . PANIC DISORDER 01/01/2007  . Obsessive compulsive disorder 01/01/2007    Past Surgical History:  Procedure  Laterality Date  . CESAREAN SECTION  05/1998   X 1  . CHOLECYSTECTOMY N/A 03/27/2016   Procedure: LAPAROSCOPIC CHOLECYSTECTOMY;  Surgeon: Olean Ree, MD;  Location: [facility] ORS;  Service: General;  Laterality: N/A;  . TONSILLECTOMY AND ADENOIDECTOMY     age 61  . TUBAL LIGATION       OB History   No obstetric history on file.     Family History  Problem Relation Age of Onset  . Diabetes Mother   . Heart disease Mother   . Kidney disease Mother   . Stroke Mother   . Hyperlipidemia Mother   . Diabetes Maternal Aunt     Social History   Tobacco Use  . Smoking status: Former Smoker    Types: Cigarettes    Quit date: 03/14/2013    Years since quitting: 6.1  . Smokeless tobacco: Never Used  . Tobacco comment: 1 cigarette1-2 times year, only when she gets stressed  Substance Use Topics  . Alcohol use: Yes    Alcohol/week: 1.0 standard drinks    Types: 1 Cans of beer per week    Comment: One can of beer every 2-3 months.   . Drug use: No    Home Medications Prior to Admission medications   Medication Sig Start Date End Date Taking? Authorizing Provider  atorvastatin (LIPITOR) 40 MG tablet Take 1 tablet (40 mg total) by mouth daily. 01/14/19   [name], MD  bacitracin ointment Apply 1 application topically 2 (two) times daily. 04/21/19   Montine Circle, PA-C  Blood Glucose Monitoring Suppl (TRUE METRIX METER) w/Device KIT 1 Device by Does not apply route 3 (three) times daily. For blood sugar checks 09/13/18   Barb Merino, MD  gabapentin (NEURONTIN) 100 MG capsule Take 1 capsule (100 mg total) by mouth 3 (three) times daily. 09/13/18 12/12/18  Barb Merino, MD  glipiZIDE (GLUCOTROL) 10 MG tablet Take 1 tablet (10 mg total) by mouth 2 (two) times daily before a meal. 01/13/19 02/12/19  [name], MD  glucose blood test strip Use as instructed 10/28/18   Argentina Donovan, PA-C  insulin glargine (LANTUS) 100 UNIT/ML injection Inject 0.15 mLs (15 Units total) into the skin  at bedtime. 10/28/18   Argentina Donovan, PA-C  Insulin Syringe-Needle U-100 (SAFETY INSULIN SYRINGES) 30G X 5/16" 0.5 ML MISC 1 each by Does not apply route at bedtime. 10/28/18   Argentina Donovan, PA-C  lisinopril (ZESTRIL) 20 MG tablet Take 1 tablet (20 mg total) by mouth daily. For high blood pressure 01/13/19   [name], MD  loperamide (IMODIUM) 2 MG capsule Take 1 capsule (2 mg total) by mouth 4 (four) times daily as needed for diarrhea or loose stools. 10/16/18   Sherwood Gambler, MD  ondansetron (ZOFRAN ODT) 4 MG disintegrating tablet Take 1 tablet (4 mg total) by mouth every 8 (eight) hours as needed for nausea or vomiting. 10/16/18   Sherwood Gambler, MD  QUEtiapine (SEROQUEL) 100 MG tablet Take 1 tablet (100 mg total) by mouth at bedtime. For mood control 09/13/18   Barb Merino, MD  TRUEplus Lancets 28G MISC 1 each by Does not apply route 3 (three) times daily. For blood sugar checks 09/13/18   Barb Merino, MD  venlafaxine XR (EFFEXOR XR) 75 MG 24 hr capsule Take 1 capsule (75 mg total) by mouth daily with breakfast. For depression 01/13/19   [name], MD    Allergies    Patient has no known allergies.  Review of Systems   Review of Systems  Constitutional: Negative for chills and fever.  Skin: Positive for wound.    Physical Exam Updated Vital Signs BP (!) 223/90   Pulse 73   Temp 99.1 F (37.3 C) (Oral)   Resp 14   Ht '5\' 2"'  (1.575 m)   Wt 64.4 kg   SpO2 100%   BMI 25.97 kg/m   Physical Exam Vitals and nursing note reviewed.  Constitutional:      General: She is not in acute distress.    Appearance: She is well-developed.  HENT:     Head: Normocephalic and atraumatic.  Eyes:     Conjunctiva/sclera: Conjunctivae normal.  Cardiovascular:     Rate and Rhythm: Normal rate.     Heart sounds: No murmur.  Pulmonary:     Effort: Pulmonary effort is normal. No respiratory distress.  Abdominal:     General: There is no distension.  Musculoskeletal:      Cervical back: Neck supple.     Comments: Moves all extremities  Skin:    General: Skin is warm and dry.     Comments: Well healing laceration to the sole of right foot without any evidence of infection  Right leg and calf are normal in appearance  Neurological:     Mental Status: She is alert and oriented to person, place, and time.  Psychiatric:        Mood and Affect: Mood normal.  Behavior: Behavior normal.     ED Results / Procedures / Treatments   Labs (all labs ordered are listed, but only abnormal results are displayed) Labs Reviewed - No data to display  EKG None  Radiology No results found.  Procedures Procedures (including critical care time)  Medications Ordered in ED Medications  bacitracin ointment 1 application (has no administration in time range)    ED Course  I have reviewed the triage vital signs and the nursing notes.  Pertinent labs & imaging results that were available during my care of the patient were reviewed by me and considered in my medical decision making (see chart for details).    MDM Rules/Calculators/A&P                      Patient with well healing laceration to the right foot.  No evidence of infection.  Return precautions given.  Bacitracin for home use.  Re: pt's BP, I have advised her to be more compliant with her lisinopril and to follow-up with her doctor.  She is otherwise well appearing and in no acute distress.    I don't think that she needs any additional workup tonight.   Final Clinical Impression(s) / ED Diagnoses Final diagnoses:  Laceration of right foot, initial encounter  Elevated blood pressure reading    Rx / DC Orders ED Discharge Orders         Ordered    bacitracin ointment  2 times daily     04/21/19 0415           Montine Circle, PA-C 04/21/19 0421    Ward, Delice Bison, DO 04/21/19 [phone]

## 2019-04-21 NOTE — ED Notes (Signed)
Patient stable. verbalized understanding of dc instructions, ambulatory with nad. Denies pain.   PA Browning aware of patient's elevated BP in the 254D systolically. PA advised this RN okay to discharge patient at this time.

## 2019-04-21 NOTE — ED Triage Notes (Signed)
Pt reports she stepped on glass about a month ago, she thought the wound had healed but she is very concerned that the foot is discolored and that she now has pain up her right leg. Pt is diabetic.  Wants to make sure she does "not have tetanus."

## 2019-04-21 NOTE — ED Notes (Signed)
ED Provider at bedside. 

## 2019-07-29 ENCOUNTER — Encounter (HOSPITAL_COMMUNITY): Payer: Self-pay

## 2019-07-29 ENCOUNTER — Emergency Department (HOSPITAL_COMMUNITY)
Admission: EM | Admit: 2019-07-29 | Discharge: 2019-07-29 | Disposition: A | Discharge status: Home / Self Care | Payer: Medicaid Other | Attending: Emergency Medicine | Admitting: Emergency Medicine

## 2019-07-29 DIAGNOSIS — R1032 Left lower quadrant pain: Secondary | ICD-10-CM | POA: Insufficient documentation

## 2019-07-29 DIAGNOSIS — R1031 Right lower quadrant pain: Secondary | ICD-10-CM | POA: Insufficient documentation

## 2019-07-29 DIAGNOSIS — R350 Frequency of micturition: Secondary | ICD-10-CM | POA: Diagnosis present

## 2019-07-29 DIAGNOSIS — Z5321 Procedure and treatment not carried out due to patient leaving prior to being seen by health care provider: Secondary | ICD-10-CM | POA: Insufficient documentation

## 2019-07-29 LAB — URINALYSIS, ROUTINE W REFLEX MICROSCOPIC
Bilirubin Urine: NEGATIVE
Glucose, UA: NEGATIVE mg/dL
Ketones, ur: NEGATIVE mg/dL
Nitrite: NEGATIVE
Protein, ur: >=300 mg/dL — AB
Specific Gravity, Urine: 1.012 (ref 1.005–1.030)
WBC, UA: >50 WBC/hpf — ABNORMAL HIGH (ref 0–5)
pH: 5 (ref 5.0–8.0)

## 2019-07-29 LAB — CBC
HCT: 28 % — ABNORMAL LOW (ref 36.0–46.0)
Hemoglobin: 9.1 g/dL — ABNORMAL LOW (ref 12.0–15.0)
MCH: 29.4 pg (ref 26.0–34.0)
MCHC: 32.5 g/dL (ref 30.0–36.0)
MCV: 90.6 fL (ref 80.0–100.0)
Platelets: 284 10*3/uL (ref 150–400)
RBC: 3.09 MIL/uL — ABNORMAL LOW (ref 3.87–5.11)
RDW: 12.8 % (ref 11.5–15.5)
WBC: 9.5 10*3/uL (ref 4.0–10.5)
nRBC: 0 % (ref 0.0–0.2)

## 2019-07-29 LAB — COMPREHENSIVE METABOLIC PANEL
ALT: 21 U/L (ref 0–44)
AST: 19 U/L (ref 15–41)
Albumin: 3.1 g/dL — ABNORMAL LOW (ref 3.5–5.0)
Alkaline Phosphatase: 89 U/L (ref 38–126)
Anion gap: 8 (ref 5–15)
BUN: 32 mg/dL — ABNORMAL HIGH (ref 6–20)
CO2: 26 mmol/L (ref 22–32)
Calcium: 8.9 mg/dL (ref 8.9–10.3)
Chloride: 108 mmol/L (ref 98–111)
Creatinine, Ser: 2.35 mg/dL — ABNORMAL HIGH (ref 0.44–1.00)
GFR calc Af Amer: 26 mL/min — ABNORMAL LOW (ref >60)
GFR calc non Af Amer: 22 mL/min — ABNORMAL LOW (ref >60)
Glucose, Bld: 62 mg/dL — ABNORMAL LOW (ref 70–99)
Potassium: 4.3 mmol/L (ref 3.5–5.1)
Sodium: 142 mmol/L (ref 135–145)
Total Bilirubin: 0.6 mg/dL (ref 0.3–1.2)
Total Protein: 7 g/dL (ref 6.5–8.1)

## 2019-07-29 MED ORDER — SODIUM CHLORIDE 0.9% FLUSH: 3.0000 mL | Freq: Once | INTRAVENOUS | Status: DC

## 2019-07-29 NOTE — ED Notes (Signed)
Pt called for reassessment x3 with no response 

## 2019-07-29 NOTE — ED Triage Notes (Signed)
Pt arrives to ED w/ c/o UTI. Pt endorses frequent urination, R/LLQ abdominal pain, back pain, and cloudy urine. Pt also requesting STD check.

## 2019-07-31 ENCOUNTER — Other Ambulatory Visit: Payer: Self-pay

## 2019-07-31 ENCOUNTER — Emergency Department (HOSPITAL_COMMUNITY)
Admission: EM | Admit: 2019-07-31 | Discharge: 2019-07-31 | Disposition: A | Discharge status: Home / Self Care | Payer: Medicaid Other | Attending: Emergency Medicine | Admitting: Emergency Medicine

## 2019-07-31 ENCOUNTER — Encounter (HOSPITAL_COMMUNITY): Payer: Self-pay | Admitting: Emergency Medicine

## 2019-07-31 DIAGNOSIS — Z794 Long term (current) use of insulin: Secondary | ICD-10-CM | POA: Insufficient documentation

## 2019-07-31 DIAGNOSIS — R300 Dysuria: Secondary | ICD-10-CM | POA: Diagnosis present

## 2019-07-31 DIAGNOSIS — I10 Essential (primary) hypertension: Secondary | ICD-10-CM | POA: Diagnosis not present

## 2019-07-31 DIAGNOSIS — E119 Type 2 diabetes mellitus without complications: Secondary | ICD-10-CM | POA: Insufficient documentation

## 2019-07-31 DIAGNOSIS — Z79899 Other long term (current) drug therapy: Secondary | ICD-10-CM | POA: Diagnosis not present

## 2019-07-31 DIAGNOSIS — Z87891 Personal history of nicotine dependence: Secondary | ICD-10-CM | POA: Insufficient documentation

## 2019-07-31 DIAGNOSIS — N3090 Cystitis, unspecified without hematuria: Secondary | ICD-10-CM | POA: Diagnosis not present

## 2019-07-31 LAB — URINALYSIS, ROUTINE W REFLEX MICROSCOPIC
Bilirubin Urine: NEGATIVE
Glucose, UA: NEGATIVE mg/dL
Ketones, ur: NEGATIVE mg/dL
Nitrite: NEGATIVE
Protein, ur: >=300 mg/dL — AB
Specific Gravity, Urine: 1.011 (ref 1.005–1.030)
WBC, UA: >50 WBC/hpf — ABNORMAL HIGH (ref 0–5)
pH: 5 (ref 5.0–8.0)

## 2019-07-31 MED ORDER — CEPHALEXIN 500 MG PO CAPS: 500.0000 mg | ORAL_CAPSULE | Freq: Two times a day (BID) | ORAL | 0 refills | Status: DC

## 2019-07-31 MED ORDER — PHENAZOPYRIDINE HCL 200 MG PO TABS: 200.0000 mg | ORAL_TABLET | Freq: Three times a day (TID) | ORAL | 0 refills | Status: DC

## 2019-07-31 NOTE — ED Notes (Signed)
Pt aware urine sample is needed. Given 2 cups of water

## 2019-07-31 NOTE — Discharge Instructions (Addendum)
Urine is infected  Take antibiotics as prescribed  Pyridium will help with bladder spasm, may turn urine orange  Stay hydrated  Return for fever, worsening symptoms, flank pain

## 2019-07-31 NOTE — ED Provider Notes (Signed)
[facility] EMERGENCY DEPARTMENT Provider Note   CSN: [id number] Arrival date & time: 07/31/19  1041     History Chief Complaint  Patient presents with  . Urinary Tract Infection  . Abdominal Pain  . Dysuria    Lorraine Turner is a 59 y.o. female with pmh below presents to ER for evaluation of pain with urination for 2 weeks. Associated with mild lower abdominal pain only with urination. Cloudy and malodorous urine as well. States she recently had intercourse with her female partner and did not urinate afterwards and thinks this made it worse.  She denies abdominal or flank pain. No associated fever, nausea, vomiting. No abnormal vaginal discharge, vaginal discharge. States she is not concerned about STD. Sexually active with one female partner only.  No abnormal vaginal bleeding.  No interventions.   HPI     Past Medical History:  Diagnosis Date  . Anxiety   . Bipolar 1 disorder ([place])   . Depression   . Diabetes mellitus without complication ([place])   . Gallstones   . Hyperlipidemia   . Hypertension   . Neuropathy   . Schizophrenia ([place])   . Seizures ([place])    X1- febrile seizure as a child-none since    Patient Active Problem List   Diagnosis Date Noted  . DKA (diabetic ketoacidoses) ([place]) 09/12/2018  . Suicidal ideation 09/12/2018  . AKI (acute kidney injury) ([place]) 09/12/2018  . Adjustment disorder with mixed disturbance of emotions and conduct   . MDD (major depressive disorder), severe ([place]) 03/03/2018  . Umbilical hernia with obstruction 11/24/2015  . Symptomatic cholelithiasis 11/24/2015  . Type 2 diabetes mellitus with hyperglycemia ([place])   . Schizoaffective disorder, depressive type ([place]) 08/14/2011  . UNSPECIFIED ANEMIA 01/23/2009  . Essential hypertension 10/12/2008  . HYPERCHOLESTEROLEMIA 01/01/2007  . PANIC DISORDER 01/01/2007  . Obsessive compulsive disorder 01/01/2007    Past Surgical History:  Procedure Laterality Date  . CESAREAN  SECTION  05/1998   X 1  . CHOLECYSTECTOMY N/A 03/27/2016   Procedure: LAPAROSCOPIC CHOLECYSTECTOMY;  Surgeon: Olean Ree, MD;  Location: [facility] ORS;  Service: General;  Laterality: N/A;  . TONSILLECTOMY AND ADENOIDECTOMY     age 49  . TUBAL LIGATION       OB History   No obstetric history on file.     Family History  Problem Relation Age of Onset  . Diabetes Mother   . Heart disease Mother   . Kidney disease Mother   . Stroke Mother   . Hyperlipidemia Mother   . Diabetes Maternal Aunt     Social History   Tobacco Use  . Smoking status: Former Smoker    Types: Cigarettes    Quit date: 03/14/2013    Years since quitting: 6.3  . Smokeless tobacco: Never Used  . Tobacco comment: 1 cigarette1-2 times year, only when she gets stressed  Vaping Use  . Vaping Use: Never used  Substance Use Topics  . Alcohol use: Yes    Alcohol/week: 1.0 standard drink    Types: 1 Cans of beer per week    Comment: One can of beer every 2-3 months.   . Drug use: No    Home Medications Prior to Admission medications   Medication Sig Start Date End Date Taking? Authorizing Provider  atorvastatin (LIPITOR) 40 MG tablet Take 1 tablet (40 mg total) by mouth daily. 01/14/19   [name], MD  bacitracin ointment Apply 1 application topically 2 (two) times daily. 04/21/19  Montine Circle, PA-C  Blood Glucose Monitoring Suppl (TRUE METRIX METER) w/Device KIT 1 Device by Does not apply route 3 (three) times daily. For blood sugar checks 09/13/18   Barb Merino, MD  cephALEXin (KEFLEX) 500 MG capsule Take 1 capsule (500 mg total) by mouth 2 (two) times daily. 07/31/19   Kinnie Feil, PA-C  gabapentin (NEURONTIN) 100 MG capsule Take 1 capsule (100 mg total) by mouth 3 (three) times daily. 09/13/18 12/12/18  Barb Merino, MD  glipiZIDE (GLUCOTROL) 10 MG tablet Take 1 tablet (10 mg total) by mouth 2 (two) times daily before a meal. 01/13/19 02/12/19  [name], MD  glucose blood test strip Use as  instructed 10/28/18   Argentina Donovan, PA-C  insulin glargine (LANTUS) 100 UNIT/ML injection Inject 0.15 mLs (15 Units total) into the skin at bedtime. 10/28/18   Argentina Donovan, PA-C  Insulin Syringe-Needle U-100 (SAFETY INSULIN SYRINGES) 30G X 5/16" 0.5 ML MISC 1 each by Does not apply route at bedtime. 10/28/18   Argentina Donovan, PA-C  lisinopril (ZESTRIL) 20 MG tablet Take 1 tablet (20 mg total) by mouth daily. For high blood pressure 01/13/19   [name], MD  loperamide (IMODIUM) 2 MG capsule Take 1 capsule (2 mg total) by mouth 4 (four) times daily as needed for diarrhea or loose stools. 10/16/18   Sherwood Gambler, MD  ondansetron (ZOFRAN ODT) 4 MG disintegrating tablet Take 1 tablet (4 mg total) by mouth every 8 (eight) hours as needed for nausea or vomiting. 10/16/18   Sherwood Gambler, MD  phenazopyridine (PYRIDIUM) 200 MG tablet Take 1 tablet (200 mg total) by mouth 3 (three) times daily. 07/31/19   Kinnie Feil, PA-C  QUEtiapine (SEROQUEL) 100 MG tablet Take 1 tablet (100 mg total) by mouth at bedtime. For mood control 09/13/18   Barb Merino, MD  TRUEplus Lancets 28G MISC 1 each by Does not apply route 3 (three) times daily. For blood sugar checks 09/13/18   Barb Merino, MD  venlafaxine XR (EFFEXOR XR) 75 MG 24 hr capsule Take 1 capsule (75 mg total) by mouth daily with breakfast. For depression 01/13/19   [name], MD    Allergies    Patient has no known allergies.  Review of Systems   Review of Systems  Gastrointestinal: Positive for abdominal pain.  Genitourinary: Positive for difficulty urinating.  All other systems reviewed and are negative.   Physical Exam Updated Vital Signs BP (!) 189/71 (BP Location: Right Arm)   Pulse 72   Temp 98.2 F (36.8 C) (Oral)   Resp 16   SpO2 96%   Physical Exam Vitals and nursing note reviewed.  Constitutional:      Appearance: She is well-developed.     Comments: Non toxic in NAD  HENT:     Head: Normocephalic and  atraumatic.     Nose: Nose normal.  Eyes:     Conjunctiva/sclera: Conjunctivae normal.  Cardiovascular:     Rate and Rhythm: Normal rate and regular rhythm.  Pulmonary:     Effort: Pulmonary effort is normal.     Breath sounds: Normal breath sounds.  Abdominal:     General: Bowel sounds are normal.     Palpations: Abdomen is soft.     Tenderness: There is abdominal tenderness in the suprapubic area.     Comments: No G/R/R. No CVA tenderness. Negative Murphy's and McBurney's. Active BS to lower quadrants.   Musculoskeletal:        General:  Normal range of motion.     Cervical back: Normal range of motion.  Skin:    General: Skin is warm and dry.     Capillary Refill: Capillary refill takes less than 2 seconds.  Neurological:     Mental Status: She is alert.  Psychiatric:        Behavior: Behavior normal.     ED Results / Procedures / Treatments   Labs (all labs ordered are listed, but only abnormal results are displayed) Labs Reviewed  URINALYSIS, ROUTINE W REFLEX MICROSCOPIC - Abnormal; Notable for the following components:      Result Value   APPearance TURBID (*)    Hgb urine dipstick SMALL (*)    Protein, ur >=300 (*)    Leukocytes,Ua LARGE (*)    WBC, UA >50 (*)    Bacteria, UA RARE (*)    All other components within normal limits  URINE CULTURE    EKG None  Radiology No results found.  Procedures Procedures (including critical care time)  Medications Ordered in ED Medications - No data to display  ED Course  I have reviewed the triage vital signs and the nursing notes.  Pertinent labs & imaging results that were available during my care of the patient were reviewed by me and considered in my medical decision making (see chart for details).  Clinical Course as of Jul 31 1351  Sat Jul 31, 2019  1255 Leukocytes,Ua(!): LARGE [CG]  1255 WBC, UA(!): >50 [CG]  1255 Bacteria, UA(!): RARE [CG]    Clinical Course User Index [CG] Kinnie Feil, PA-C     MDM Rules/Calculators/A&P                           This patient complains of dysuria and lower abdominal pain with urination.    I obtained additional history from previous medical records available, nursing notes reviewed to obtain more history and assist with MDM  UA ordered at triage.  This was personally reviewed, confirms infection.    She has no red flags like fever, nausea, vomiting, abdominal or flank tenderness.  She is not concerned for STD and has no vaginal symptoms.   No indication for further emergent lab work, imaging.   Will dc with antibiotics, pyridium, oral hydration. Return precautions given.    Final Clinical Impression(s) / ED Diagnoses Final diagnoses:  Cystitis    Rx / DC Orders ED Discharge Orders         Ordered    cephALEXin (KEFLEX) 500 MG capsule  2 times daily     Discontinue  Reprint     07/31/19 1257    phenazopyridine (PYRIDIUM) 200 MG tablet  3 times daily     Discontinue  Reprint     07/31/19 1257           Kinnie Feil, PA-C 07/31/19 1353    Lacretia Leigh, MD 08/01/19 Karl Bales

## 2019-07-31 NOTE — ED Notes (Signed)
Patient tried to give urine sample, states she is unable to give sample at this time.

## 2019-07-31 NOTE — ED Triage Notes (Signed)
Pt. Stated, I have a UTI for the last week. Burns when I urinate

## 2019-07-31 NOTE — ED Notes (Signed)
Patient given discharge instructions. Questions were answered. Patient verbalized understanding of discharge instructions and care at home.  

## 2019-08-02 LAB — URINE CULTURE: Culture: >=100000 — AB

## 2019-08-03 ENCOUNTER — Telehealth: Payer: Self-pay

## 2019-08-03 NOTE — Telephone Encounter (Signed)
Post ED Visit - Positive Culture Follow-up  Culture report reviewed by antimicrobial stewardship pharmacist: [facility] Team []  Elenor Quinones, Pharm.D. []  Heide Guile, Pharm.D., BCPS AQ-ID []  Parks Neptune, Pharm.D., BCPS []  Alycia Rossetti, Pharm.D., BCPS []  [place], Pharm.D., BCPS, AAHIVP []  Legrand Como, Pharm.D., BCPS, AAHIVP [x]  Salome Arnt, PharmD, BCPS []  Johnnette Gourd, PharmD, BCPS []  Hughes Better, PharmD, BCPS []  Leeroy Cha, PharmD []  Laqueta Linden, PharmD, BCPS []  Albertina Parr, PharmD  [place] Team []  Leodis Sias, PharmD []  Lindell Spar, PharmD []  Royetta Asal, PharmD []  Graylin Shiver, Rph []  Rema Fendt) Glennon Mac, PharmD []  Arlyn Dunning, PharmD []  Netta Cedars, PharmD []  Dia Sitter, PharmD []  Leone Haven, PharmD []  Gretta Arab, PharmD []  Theodis Shove, PharmD []  Peggyann Juba, PharmD []  Reuel Boom, PharmD   Positive urine culture Treated with Cephalexin, organism sensitive to the same and no further patient follow-up is required at this time.  Genia Del 08/03/2019, 8:16 AM

## 2019-09-13 ENCOUNTER — Other Ambulatory Visit: Payer: Self-pay | Admitting: Family Medicine

## 2019-09-13 DIAGNOSIS — E1165 Type 2 diabetes mellitus with hyperglycemia: Secondary | ICD-10-CM

## 2019-09-13 DIAGNOSIS — Z794 Long term (current) use of insulin: Secondary | ICD-10-CM

## 2019-09-13 DIAGNOSIS — I10 Essential (primary) hypertension: Secondary | ICD-10-CM

## 2019-09-13 NOTE — Telephone Encounter (Signed)
Requested medication (s) are due for refill today:no  Requested medication (s) are on the active medication list: yes  Last refill:  06/03/2019  Future visit scheduled: no  Notes to clinic:  Patient no showed last appointment  Review for refill   Requested Prescriptions  Pending Prescriptions Disp Refills   lisinopril (ZESTRIL) 20 MG tablet [Pharmacy Med Name: LISINOPRIL 20MG  TABLETS] 90 tablet     Sig: TAKE 1 TABLET(20 MG) BY MOUTH DAILY FOR HIGH BLOOD PRESSURE      Cardiovascular:  ACE Inhibitors Failed - 09/13/2019 11:04 AM      Failed - Cr in normal range and within 180 days    Creat  Date Value Ref Range Status  04/09/2016 0.65 0.50 - 1.05 mg/dL Final    Comment:      For patients > or = 59 years of age: The upper reference limit for Creatinine is approximately 13% higher for people identified as African-American.      Creatinine, Ser  Date Value Ref Range Status  07/29/2019 2.35 (H) 0.44 - 1.00 mg/dL Final   Creatinine, Urine  Date Value Ref Range Status  04/09/2016 241 20 - 320 mg/dL Final          Failed - Last BP in normal range    BP Readings from Last 1 Encounters:  07/31/19 (!) 205/83          Failed - Valid encounter within last 6 months    Recent Outpatient Visits           8 months ago Type 2 diabetes mellitus with hyperglycemia, with long-term current use of insulin ([place])   [place], Charlane Ferretti, MD   10 months ago Type 2 diabetes mellitus with hyperglycemia, with long-term current use of insulin [facility])   [place], [place], [place]   1 year ago Type 2 diabetes mellitus with hyperglycemia, with long-term current use of insulin ([place])   [place], [place] W, NP   2 years ago Type 2 diabetes mellitus with hyperglycemia, with long-term current use of insulin ([place])   [place], Charlane Ferretti, MD   2 years ago  Type 2 diabetes mellitus with hyperglycemia, with long-term current use of insulin ([place])   [place], [place], MD              Passed - K in normal range and within 180 days    Potassium  Date Value Ref Range Status  07/29/2019 4.3 3.5 - 5.1 mmol/L Final          Passed - Patient is not pregnant        glipiZIDE (GLUCOTROL) 10 MG tablet [Pharmacy Med Name: GLIPIZIDE 10MG  TABLETS] 180 tablet     Sig: TAKE 1 TABLET(10 MG) BY MOUTH TWICE DAILY BEFORE A MEAL      Endocrinology:  Diabetes - Sulfonylureas Failed - 09/13/2019 11:04 AM      Failed - HBA1C is between 0 and 7.9 and within 180 days    Hgb A1c MFr Bld  Date Value Ref Range Status  01/13/2019 7.6 (H) 4.8 - 5.6 % Final    Comment:             Prediabetes: 5.7 - 6.4          Diabetes: >6.4          Glycemic control for  adults with diabetes: <7.0           Failed - Valid encounter within last 6 months    Recent Outpatient Visits           8 months ago Type 2 diabetes mellitus with hyperglycemia, with long-term current use of insulin ([place])   [place], Blanca, MD   10 months ago Type 2 diabetes mellitus with hyperglycemia, with long-term current use of insulin [facility])   [place], [place], [place]   1 year ago Type 2 diabetes mellitus with hyperglycemia, with long-term current use of insulin [facility])   [place], [place] W, NP   2 years ago Type 2 diabetes mellitus with hyperglycemia, with long-term current use of insulin ([place])   [place], Charlane Ferretti, MD   2 years ago Type 2 diabetes mellitus with hyperglycemia, with long-term current use of insulin [facility])   [place] [facility] [name], MD

## 2019-09-30 ENCOUNTER — Other Ambulatory Visit: Payer: Self-pay

## 2019-09-30 ENCOUNTER — Ambulatory Visit (HOSPITAL_COMMUNITY)
Admission: EM | Admit: 2019-09-30 | Discharge: 2019-09-30 | Disposition: A | Discharge status: Home / Self Care | Payer: Medicaid Other | Attending: Family Medicine | Admitting: Family Medicine

## 2019-09-30 DIAGNOSIS — R8290 Unspecified abnormal findings in urine: Secondary | ICD-10-CM | POA: Insufficient documentation

## 2019-09-30 DIAGNOSIS — R809 Proteinuria, unspecified: Secondary | ICD-10-CM | POA: Insufficient documentation

## 2019-09-30 DIAGNOSIS — I10 Essential (primary) hypertension: Secondary | ICD-10-CM | POA: Diagnosis present

## 2019-09-30 DIAGNOSIS — R300 Dysuria: Secondary | ICD-10-CM | POA: Diagnosis not present

## 2019-09-30 LAB — POCT URINALYSIS DIPSTICK, ED / UC
Bilirubin Urine: NEGATIVE
Glucose, UA: NEGATIVE mg/dL
Ketones, ur: NEGATIVE mg/dL
Nitrite: NEGATIVE
Protein, ur: >=300 mg/dL — AB
Specific Gravity, Urine: >=1.03 (ref 1.005–1.030)
Urobilinogen, UA: 0.2 mg/dL (ref 0.0–1.0)
pH: 6 (ref 5.0–8.0)

## 2019-09-30 NOTE — ED Provider Notes (Addendum)
[facility]    CSN: [id number] Arrival date & time: 09/30/19  1014      History   Chief Complaint Chief Complaint  Patient presents with  . Dysuria    HPI Lorraine Turner is a 59 y.o. female.   Patient history of hypertension presents for 2-week history of cloudy strong smelling urine.  Denies painful urination, frequency or urgency.  Denies abdominal pain, nausea or vomiting.  Denies back pain.  Reports she is compliant with her lisinopril.  Has not seen her primary care in some time, dates due to pandemic, however she also states she has not tried to call for an appointment recently.  Denies chest pain, shortness of breath, headache, dizziness.  Denies blurry vision.     Past Medical History:  Diagnosis Date  . Anxiety   . Bipolar 1 disorder ([place])   . Depression   . Diabetes mellitus without complication ([place])   . Gallstones   . Hyperlipidemia   . Hypertension   . Neuropathy   . Schizophrenia ([place])   . Seizures ([place])    X1- febrile seizure as a child-none since    Patient Active Problem List   Diagnosis Date Noted  . DKA (diabetic ketoacidoses) ([place]) 09/12/2018  . Suicidal ideation 09/12/2018  . AKI (acute kidney injury) ([place]) 09/12/2018  . Adjustment disorder with mixed disturbance of emotions and conduct   . MDD (major depressive disorder), severe ([place]) 03/03/2018  . Umbilical hernia with obstruction 11/24/2015  . Symptomatic cholelithiasis 11/24/2015  . Type 2 diabetes mellitus with hyperglycemia ([place])   . Schizoaffective disorder, depressive type ([place]) 08/14/2011  . UNSPECIFIED ANEMIA 01/23/2009  . Essential hypertension 10/12/2008  . HYPERCHOLESTEROLEMIA 01/01/2007  . PANIC DISORDER 01/01/2007  . Obsessive compulsive disorder 01/01/2007    Past Surgical History:  Procedure Laterality Date  . CESAREAN SECTION  05/1998   X 1  . CHOLECYSTECTOMY N/A 03/27/2016   Procedure: LAPAROSCOPIC CHOLECYSTECTOMY;  Surgeon: Olean Ree, MD;  Location:  [facility] ORS;  Service: General;  Laterality: N/A;  . TONSILLECTOMY AND ADENOIDECTOMY     age 57  . TUBAL LIGATION      OB History   No obstetric history on file.      Home Medications    Prior to Admission medications   Medication Sig Start Date End Date Taking? Authorizing Provider  atorvastatin (LIPITOR) 40 MG tablet Take 1 tablet (40 mg total) by mouth daily. 01/14/19   [name], MD  bacitracin ointment Apply 1 application topically 2 (two) times daily. 04/21/19   Montine Circle, PA-C  Blood Glucose Monitoring Suppl (TRUE METRIX METER) w/Device KIT 1 Device by Does not apply route 3 (three) times daily. For blood sugar checks 09/13/18   Barb Merino, MD  gabapentin (NEURONTIN) 100 MG capsule Take 1 capsule (100 mg total) by mouth 3 (three) times daily. 09/13/18 12/12/18  Barb Merino, MD  glipiZIDE (GLUCOTROL) 10 MG tablet Take 1 tablet (10 mg total) by mouth 2 (two) times daily before a meal. 01/13/19 02/12/19  [name], MD  glucose blood test strip Use as instructed 10/28/18   Argentina Donovan, PA-C  insulin glargine (LANTUS) 100 UNIT/ML injection Inject 0.15 mLs (15 Units total) into the skin at bedtime. 10/28/18   Argentina Donovan, PA-C  Insulin Syringe-Needle U-100 (SAFETY INSULIN SYRINGES) 30G X 5/16" 0.5 ML MISC 1 each by Does not apply route at bedtime. 10/28/18   Argentina Donovan, PA-C  lisinopril (ZESTRIL) 20 MG tablet Take  1 tablet (20 mg total) by mouth daily. For high blood pressure 01/13/19   [name], MD  loperamide (IMODIUM) 2 MG capsule Take 1 capsule (2 mg total) by mouth 4 (four) times daily as needed for diarrhea or loose stools. 10/16/18   Sherwood Gambler, MD  ondansetron (ZOFRAN ODT) 4 MG disintegrating tablet Take 1 tablet (4 mg total) by mouth every 8 (eight) hours as needed for nausea or vomiting. 10/16/18   Sherwood Gambler, MD  QUEtiapine (SEROQUEL) 100 MG tablet Take 1 tablet (100 mg total) by mouth at bedtime. For mood control 09/13/18   Barb Merino, MD  TRUEplus Lancets 28G MISC 1 each by Does not apply route 3 (three) times daily. For blood sugar checks 09/13/18   Barb Merino, MD  venlafaxine XR (EFFEXOR XR) 75 MG 24 hr capsule Take 1 capsule (75 mg total) by mouth daily with breakfast. For depression 01/13/19   [name], MD    Family History Family History  Problem Relation Age of Onset  . Diabetes Mother   . Heart disease Mother   . Kidney disease Mother   . Stroke Mother   . Hyperlipidemia Mother   . Diabetes Maternal Aunt     Social History Social History   Tobacco Use  . Smoking status: Former Smoker    Types: Cigarettes    Quit date: 03/14/2013    Years since quitting: 6.5  . Smokeless tobacco: Never Used  . Tobacco comment: 1 cigarette1-2 times year, only when she gets stressed  Vaping Use  . Vaping Use: Never used  Substance Use Topics  . Alcohol use: Yes    Alcohol/week: 1.0 standard drink    Types: 1 Cans of beer per week    Comment: One can of beer every 2-3 months.   . Drug use: No     Allergies   Patient has no known allergies.   Review of Systems Review of Systems   Physical Exam Triage Vital Signs ED Triage Vitals [09/30/19 1142]  Enc Vitals Group     BP (!) 226/88     Pulse Rate 72     Resp 16     Temp 98 F (36.7 C)     Temp src      SpO2 100 %     Weight      Height      Head Circumference      Peak Flow      Pain Score      Pain Loc      Pain Edu?      Excl. in [place]?    No data found.  Updated Vital Signs BP (!) 226/88   Pulse 72   Temp 98 F (36.7 C)   Resp 16   SpO2 100%   Visual Acuity Right Eye Distance:   Left Eye Distance:   Bilateral Distance:    Right Eye Near:   Left Eye Near:    Bilateral Near:     Physical Exam Vitals and nursing note reviewed.  Constitutional:      General: She is not in acute distress.    Appearance: She is well-developed. She is not ill-appearing.  HENT:     Head: Normocephalic and atraumatic.  Eyes:      Conjunctiva/sclera: Conjunctivae normal.  Cardiovascular:     Rate and Rhythm: Normal rate and regular rhythm.     Heart sounds: No murmur heard.   Pulmonary:     Effort: Pulmonary effort  is normal. No respiratory distress.     Breath sounds: Normal breath sounds. No wheezing or rales.  Abdominal:     Palpations: Abdomen is soft.     Tenderness: There is no abdominal tenderness. There is no right CVA tenderness or left CVA tenderness.  Musculoskeletal:     Cervical back: Neck supple.  Skin:    General: Skin is warm and dry.  Neurological:     General: No focal deficit present.     Mental Status: She is alert and oriented to person, place, and time.      UC Treatments / Results  Labs (all labs ordered are listed, but only abnormal results are displayed) Labs Reviewed  POCT URINALYSIS DIPSTICK, ED / UC - Abnormal; Notable for the following components:      Result Value   Hgb urine dipstick SMALL (*)    Protein, ur >=300 (*)    Leukocytes,Ua SMALL (*)    All other components within normal limits  URINE CULTURE    EKG   Radiology No results found.  Procedures Procedures (including critical care time)  Medications Ordered in UC Medications - No data to display  Initial Impression / Assessment and Plan / UC Course  I have reviewed the triage vital signs and the nursing notes.  Pertinent labs & imaging results that were available during my care of the patient were reviewed by me and considered in my medical decision making (see chart for details).     #Hypertension #Cloudy urine #Proteinuria Patient is a 59 year old known hypertensive patient presenting with cloudy urine and proteinuria.  Hypertensive to 226/88, otherwise asymptomatic.  Per chart review patient has had proteinuria previously.  Small leukocytes and small blood, however no urinary symptoms we will culture urine, though feel this is most likely related to proteinuria.  did show some elevation in  creatinine June 2021 during an ED visit.  Discussed patient case with supervising physician Dr. Lanny Cramp, and we will hold on initiating any additional antihypertensives given this is not an acute issue and she is largely asymptomatic with known ability to follow up, instructed patient to follow-up with nephrology as soon as possible and follow-up with her primary care soon as possible.  Stressed the importance of having follow-up.  Discussed emergency department precautions.  Patient verbalized understanding plan of care. Final Clinical Impressions(s) / UC Diagnoses   Final diagnoses:  Proteinuria, unspecified type  Cloudy urine  Essential hypertension     Discharge Instructions     Call your primary care today for follow up as soon as possible  Call the kidney doctor today and take first available appointment  I think your urine issues are more related to the protein in your urine as opposed to an infection  If you developed severe headache, shortness of breath, dizziness go to the Emergency department      ED Prescriptions    None     PDMP not reviewed this encounter.   Purnell Shoemaker, PA-C 09/30/19 1853    Carole Doner, Marguerita Beards, PA-C 10/01/19 [phone]

## 2019-09-30 NOTE — Discharge Instructions (Signed)
Call your primary care today for follow up as soon as possible  Call the kidney doctor today and take first available appointment  I think your urine issues are more related to the protein in your urine as opposed to an infection  If you developed severe headache, shortness of breath, dizziness go to the Emergency department

## 2019-09-30 NOTE — ED Triage Notes (Signed)
Pt here with complaints of cloudy, foul smelling urine for 2 weeks

## 2019-10-01 ENCOUNTER — Telehealth: Payer: Self-pay | Admitting: Family Medicine

## 2019-10-01 ENCOUNTER — Telehealth: Payer: Self-pay | Admitting: *Deleted

## 2019-10-01 NOTE — Telephone Encounter (Signed)
Please schedule appointment with any available provider.

## 2019-10-01 NOTE — Telephone Encounter (Signed)
Copied from [place] [id number]. Topic: Referral - Request for Referral >> Oct 01, 2019 10:32 AM Alanda Slim E wrote: Has patient seen PCP for this complaint? No  *If NO, is insurance requiring patient see PCP for this issue before PCP can refer them? Referral for which specialty: [facility] Preferred provider/office: [facility]  Reason for referral: Pt was seen by Dr. Roland Rack / hes a PA and he advised pt that she has protein [place] ? Dr. Marvetta Gibbons is advising Pt been seen at [facility] ASAP/ please advise

## 2019-10-01 NOTE — Telephone Encounter (Signed)
Patient was seen in ED and was informed to follow up with PCP and Kidney doctor ASAP.  Patient is needing a referral to Kidney doctor.

## 2019-10-01 NOTE — Telephone Encounter (Signed)
Patient was seen in the ER and the ER told her she needs to be seen as soon as possible. I made the call center aware that I will forward this to the nurse and we do not have any sooner appts. Please see ER note.

## 2019-10-01 NOTE — Telephone Encounter (Signed)
Please schedule an in person visit for her I have not seen her since 01/2019.  Thank you.

## 2019-10-01 NOTE — Telephone Encounter (Signed)
Pt is scheduled for 9/10 and pt is aware

## 2019-10-01 NOTE — Telephone Encounter (Signed)
Patient has been scheduled for 10/07/19 at 1050

## 2019-10-02 ENCOUNTER — Telehealth (HOSPITAL_COMMUNITY): Payer: Self-pay

## 2019-10-02 LAB — URINE CULTURE: Culture: >=100000 — AB

## 2019-10-02 MED ORDER — NITROFURANTOIN MONOHYD MACRO 100 MG PO CAPS: 100.0000 mg | ORAL_CAPSULE | Freq: Two times a day (BID) | ORAL | 0 refills | Status: DC

## 2019-10-07 ENCOUNTER — Ambulatory Visit: Payer: Medicaid Other | Admitting: Family Medicine

## 2019-10-21 ENCOUNTER — Ambulatory Visit: Payer: Medicaid Other | Attending: Family Medicine | Admitting: Physician Assistant

## 2019-10-21 ENCOUNTER — Encounter: Payer: Self-pay | Admitting: Physician Assistant

## 2019-10-21 ENCOUNTER — Other Ambulatory Visit (HOSPITAL_COMMUNITY)
Admission: RE | Admit: 2019-10-21 | Discharge: 2019-10-21 | Disposition: A | Discharge status: Home / Self Care | Payer: Medicaid Other | Source: Ambulatory Visit | Attending: Family Medicine | Admitting: Family Medicine

## 2019-10-21 VITALS — BP 211/89 | HR 67 | Temp 97.5°F | Resp 18 | Ht 62.0 in | Wt 150.0 lb

## 2019-10-21 DIAGNOSIS — N951 Menopausal and female climacteric states: Secondary | ICD-10-CM | POA: Diagnosis not present

## 2019-10-21 DIAGNOSIS — F209 Schizophrenia, unspecified: Secondary | ICD-10-CM | POA: Insufficient documentation

## 2019-10-21 DIAGNOSIS — F319 Bipolar disorder, unspecified: Secondary | ICD-10-CM | POA: Insufficient documentation

## 2019-10-21 DIAGNOSIS — E1165 Type 2 diabetes mellitus with hyperglycemia: Secondary | ICD-10-CM | POA: Diagnosis not present

## 2019-10-21 DIAGNOSIS — Z09 Encounter for follow-up examination after completed treatment for conditions other than malignant neoplasm: Secondary | ICD-10-CM

## 2019-10-21 DIAGNOSIS — Z780 Asymptomatic menopausal state: Secondary | ICD-10-CM | POA: Insufficient documentation

## 2019-10-21 DIAGNOSIS — E785 Hyperlipidemia, unspecified: Secondary | ICD-10-CM | POA: Insufficient documentation

## 2019-10-21 DIAGNOSIS — Z79899 Other long term (current) drug therapy: Secondary | ICD-10-CM | POA: Diagnosis not present

## 2019-10-21 DIAGNOSIS — Z87440 Personal history of urinary (tract) infections: Secondary | ICD-10-CM | POA: Diagnosis not present

## 2019-10-21 DIAGNOSIS — N289 Disorder of kidney and ureter, unspecified: Secondary | ICD-10-CM | POA: Insufficient documentation

## 2019-10-21 DIAGNOSIS — R809 Proteinuria, unspecified: Secondary | ICD-10-CM | POA: Insufficient documentation

## 2019-10-21 DIAGNOSIS — Z7901 Long term (current) use of anticoagulants: Secondary | ICD-10-CM | POA: Insufficient documentation

## 2019-10-21 DIAGNOSIS — I10 Essential (primary) hypertension: Secondary | ICD-10-CM | POA: Insufficient documentation

## 2019-10-21 DIAGNOSIS — E1140 Type 2 diabetes mellitus with diabetic neuropathy, unspecified: Secondary | ICD-10-CM | POA: Diagnosis not present

## 2019-10-21 DIAGNOSIS — Z794 Long term (current) use of insulin: Secondary | ICD-10-CM | POA: Diagnosis not present

## 2019-10-21 DIAGNOSIS — N390 Urinary tract infection, site not specified: Secondary | ICD-10-CM | POA: Diagnosis not present

## 2019-10-21 DIAGNOSIS — F4325 Adjustment disorder with mixed disturbance of emotions and conduct: Secondary | ICD-10-CM | POA: Insufficient documentation

## 2019-10-21 DIAGNOSIS — D649 Anemia, unspecified: Secondary | ICD-10-CM | POA: Diagnosis not present

## 2019-10-21 LAB — POCT URINALYSIS DIP (CLINITEK)
Bilirubin, UA: NEGATIVE
Glucose, UA: NEGATIVE mg/dL
Ketones, POC UA: NEGATIVE mg/dL
Nitrite, UA: NEGATIVE
POC PROTEIN,UA: >=300 — AB
Spec Grav, UA: 1.025 (ref 1.010–1.025)
Urobilinogen, UA: 0.2 E.U./dL
pH, UA: 5.5 (ref 5.0–8.0)

## 2019-10-21 LAB — POCT GLYCOSYLATED HEMOGLOBIN (HGB A1C): Hemoglobin A1C: 6.8 % — AB (ref 4.0–5.6)

## 2019-10-21 LAB — GLUCOSE, POCT (MANUAL RESULT ENTRY): POC Glucose: 121 mg/dl — AB (ref 70–99)

## 2019-10-21 MED ORDER — "INSULIN SYRINGE-NEEDLE U-100 30G X 5/16"" 0.5 ML MISC": 1.0000 | Freq: Every day | 3 refills | Status: DC

## 2019-10-21 MED ORDER — GABAPENTIN 100 MG PO CAPS: 100.0000 mg | ORAL_CAPSULE | Freq: Three times a day (TID) | ORAL | 2 refills | Status: DC

## 2019-10-21 MED ORDER — QUETIAPINE FUMARATE 100 MG PO TABS: 100.0000 mg | ORAL_TABLET | Freq: Every day | ORAL | 0 refills | Status: DC

## 2019-10-21 MED ORDER — ATORVASTATIN CALCIUM 40 MG PO TABS: 40.0000 mg | ORAL_TABLET | Freq: Every day | ORAL | 6 refills | Status: DC

## 2019-10-21 MED ORDER — VENLAFAXINE HCL ER 75 MG PO CP24: 75.0000 mg | ORAL_CAPSULE | Freq: Every day | ORAL | 6 refills | Status: DC

## 2019-10-21 MED ORDER — LISINOPRIL 20 MG PO TABS: 20.0000 mg | ORAL_TABLET | Freq: Every day | ORAL | 2 refills | Status: DC

## 2019-10-21 MED ORDER — INSULIN GLARGINE 100 UNIT/ML ~~LOC~~ SOLN: 15.0000 [IU] | Freq: Every day | SUBCUTANEOUS | 11 refills | Status: DC

## 2019-10-21 MED ORDER — AMLODIPINE BESYLATE 5 MG PO TABS: 5.0000 mg | ORAL_TABLET | Freq: Every day | ORAL | 3 refills | Status: DC

## 2019-10-21 MED ORDER — LISINOPRIL 20 MG PO TABS: 30.0000 mg | ORAL_TABLET | Freq: Every day | ORAL | 6 refills | Status: DC

## 2019-10-21 MED ORDER — GLIPIZIDE 10 MG PO TABS: 10.0000 mg | ORAL_TABLET | Freq: Two times a day (BID) | ORAL | 6 refills | Status: DC

## 2019-10-21 MED ORDER — NITROFURANTOIN MONOHYD MACRO 100 MG PO CAPS: 100.0000 mg | ORAL_CAPSULE | Freq: Two times a day (BID) | ORAL | 0 refills | Status: DC

## 2019-10-21 MED ORDER — FLUCONAZOLE 150 MG PO TABS: 150.0000 mg | ORAL_TABLET | Freq: Once | ORAL | 0 refills | Status: AC

## 2019-10-21 NOTE — Progress Notes (Signed)
Patient ID: JERINE SURLES, female   DOB: 1960/03/05, 59 y.o.   MRN: [id number]      Lorraine Turner, is a 59 y.o. female  [id number]  [id number]  DOB - Jan 02, 1961  Subjective:  Chief Complaint and HPI: Lorraine Turner is a 59 y.o. female here today for a follow up visit After being seen in the ED 09/30/2019.  Denies urinary s/sx.  Does not drink any water.  No HA/CP/dizziness.  Does not urinate after sex.  No vaginal discharge.  No pelvic pain or fever.    #Hypertension #Cloudy urine #Proteinuria Patient is a 59 year old known hypertensive patient presenting with cloudy urine and proteinuria.  Hypertensive to 226/88, otherwise asymptomatic.  Per chart review patient has had proteinuria previously.  Small leukocytes and small blood, however no urinary symptoms we will culture urine, though feel this is most likely related to proteinuria.  did show some elevation in creatinine June 2021 during an ED visit.  Discussed patient case with supervising physician Dr. Lanny Cramp, and we will hold on initiating any additional antihypertensives given this is not an acute issue and she is largely asymptomatic with known ability to follow up, instructed patient to follow-up with nephrology as soon as possible and follow-up with her primary care soon as possible.  Stressed the importance of having follow-up.  Discussed emergency department precautions.  Patient verbalized understanding plan of care. ED/Hospital notes reviewed  ROS:   Constitutional:  No f/c, No night sweats, No unexplained weight loss. EENT:  No vision changes, No blurry vision, No hearing changes. No mouth, throat, or ear problems.  Respiratory: No cough, No SOB Cardiac: No CP, no palpitations GI:  No abd pain, No N/V/D. GU: No Urinary s/sx Musculoskeletal: No joint pain Neuro: No headache, no dizziness, no motor weakness.  Skin: No rash Endocrine:  No polydipsia. No polyuria.  Psych: Denies SI/HI  No problems  updated.  ALLERGIES: No Known Allergies  PAST MEDICAL HISTORY: Past Medical History:  Diagnosis Date   Anxiety    Bipolar 1 disorder ([place])    Depression    Diabetes mellitus without complication ([place])    Gallstones    Hyperlipidemia    Hypertension    Neuropathy    Schizophrenia ([place])    Seizures ([place])    X1- febrile seizure as a child-none since    MEDICATIONS AT HOME: Prior to Admission medications   Medication Sig Start Date End Date Taking? Authorizing Provider  atorvastatin (LIPITOR) 40 MG tablet Take 1 tablet (40 mg total) by mouth daily. 10/21/19  Yes Freeman Caldron M, PA-C  bacitracin ointment Apply 1 application topically 2 (two) times daily. 04/21/19  Yes Montine Circle, PA-C  Blood Glucose Monitoring Suppl (TRUE METRIX METER) w/Device KIT 1 Device by Does not apply route 3 (three) times daily. For blood sugar checks 09/13/18  Yes Ghimire, Dante Gang, MD  glucose blood test strip Use as instructed 10/28/18  Yes Aura Bibby M, PA-C  insulin glargine (LANTUS) 100 UNIT/ML injection Inject 0.15 mLs (15 Units total) into the skin at bedtime. 10/21/19  Yes Brayn Eckstein M, PA-C  Insulin Syringe-Needle U-100 (SAFETY INSULIN SYRINGES) 30G X 5/16" 0.5 ML MISC 1 each by Does not apply route at bedtime. 10/21/19  Yes Freeman Caldron M, PA-C  lisinopril (ZESTRIL) 20 MG tablet Take 1 tablet (20 mg total) by mouth daily. For high blood pressure 10/21/19  Yes Zamarion Longest M, PA-C  loperamide (IMODIUM) 2 MG capsule Take 1 capsule (2 mg total) by mouth 4 (  four) times daily as needed for diarrhea or loose stools. 10/16/18  Yes Sherwood Gambler, MD  nitrofurantoin, macrocrystal-monohydrate, (MACROBID) 100 MG capsule Take 1 capsule (100 mg total) by mouth 2 (two) times daily. 10/21/19  Yes Denijah Karrer, Dionne Bucy, PA-C  ondansetron (ZOFRAN ODT) 4 MG disintegrating tablet Take 1 tablet (4 mg total) by mouth every 8 (eight) hours as needed for nausea or vomiting. 10/16/18  Yes Sherwood Gambler, MD   QUEtiapine (SEROQUEL) 100 MG tablet Take 1 tablet (100 mg total) by mouth at bedtime. For mood control 10/21/19  Yes Jonay Hitchcock M, PA-C  TRUEplus Lancets 28G MISC 1 each by Does not apply route 3 (three) times daily. For blood sugar checks 09/13/18  Yes Barb Merino, MD  venlafaxine XR (EFFEXOR XR) 75 MG 24 hr capsule Take 1 capsule (75 mg total) by mouth daily with breakfast. For depression 10/21/19  Yes Ilyas Lipsitz M, PA-C  amLODipine (NORVASC) 5 MG tablet Take 1 tablet (5 mg total) by mouth daily. 10/21/19   Argentina Donovan, PA-C  fluconazole (DIFLUCAN) 150 MG tablet Take 1 tablet (150 mg total) by mouth once for 1 dose. 10/21/19 10/21/19  Argentina Donovan, PA-C  gabapentin (NEURONTIN) 100 MG capsule Take 1 capsule (100 mg total) by mouth 3 (three) times daily. 10/21/19 01/19/20  Argentina Donovan, PA-C  glipiZIDE (GLUCOTROL) 10 MG tablet Take 1 tablet (10 mg total) by mouth 2 (two) times daily before a meal. 10/21/19 11/20/19  Argentina Donovan, PA-C     Objective:  EXAM:   Vitals:   10/21/19 1443  BP: (!) 211/89  Pulse: 67  Resp: 18  Temp: (!) 97.5 F (36.4 C)  TempSrc: Temporal  SpO2: 100%  Weight: 150 lb (68 kg)  Height: '5\' 2"'  (1.575 m)    General appearance : A&OX3. NAD. Non-toxic-appearing HEENT: Atraumatic and Normocephalic.  PERRLA. EOM intact.  Neck: supple, no JVD. No cervical lymphadenopathy. No thyromegaly Chest/Lungs:  Breathing-non-labored, Good air entry bilaterally, breath sounds normal without rales, rhonchi, or wheezing  CVS: S1 S2 regular, no murmurs, gallops, rubs  Extremities: Bilateral Lower Ext shows no edema, both legs are warm to touch with = pulse throughout Neurology:  CN II-XII grossly intact, Non focal.   Psych:  TP linear. J/I fair. Normal speech. Appropriate eye contact and affect.  Skin:  No Rash  Data Review Lab Results  Component Value Date   HGBA1C 6.8 (A) 10/21/2019   HGBA1C 7.6 (H) 01/13/2019   HGBA1C 14.2 (H) 09/12/2018     Assessment  & Plan   1. Type 2 diabetes mellitus with hyperglycemia, with long-term current use of insulin (HCC) Improving;  Continue current regimen-working on diet too - Glucose (CBG) - HgB A1c - POCT URINALYSIS DIP (CLINITEK) - insulin glargine (LANTUS) 100 UNIT/ML injection; Inject 0.15 mLs (15 Units total) into the skin at bedtime.  Dispense: 10 mL; Refill: 11 - Insulin Syringe-Needle U-100 (SAFETY INSULIN SYRINGES) 30G X 5/16" 0.5 ML MISC; 1 each by Does not apply route at bedtime.  Dispense: 100 each; Refill: 3 - glipiZIDE (GLUCOTROL) 10 MG tablet; Take 1 tablet (10 mg total) by mouth 2 (two) times daily before a meal.  Dispense: 60 tablet; Refill: 6 - gabapentin (NEURONTIN) 100 MG capsule; Take 1 capsule (100 mg total) by mouth 3 (three) times daily.  Dispense: 90 capsule; Refill: 2 - atorvastatin (LIPITOR) 40 MG tablet; Take 1 tablet (40 mg total) by mouth daily.  Dispense: 30 tablet; Refill: 6 - Ambulatory referral  to Nephrology - Comprehensive metabolic panel  2. Essential hypertension Uncontrolled.  Consulted with Luke(PharmD/clinical pharmacist) -add amlodipine 68m daily. - Ambulatory referral to Nephrology - lisinopril (ZESTRIL) 20 MG tablet; Take 1 tablet (20 mg total) by mouth daily. For high blood pressure  Dispense: 90 tablet; Refill: 2 - Comprehensive metabolic panel - CBC with Differential/Platelet  3. Menopausal state - venlafaxine XR (EFFEXOR XR) 75 MG 24 hr capsule; Take 1 capsule (75 mg total) by mouth daily with breakfast. For depression  Dispense: 60 capsule; Refill: 6  4. Recurrent UTI Urinary hygiene - Urine Culture - nitrofurantoin, macrocrystal-monohydrate, (MACROBID) 100 MG capsule; Take 1 capsule (100 mg total) by mouth 2 (two) times daily.  Dispense: 10 capsule; Refill: 0 - Cervicovaginal ancillary only  5. Adjustment disorder with mixed disturbance of emotions and conduct - QUEtiapine (SEROQUEL) 100 MG tablet; Take 1 tablet (100 mg total) by mouth at bedtime.  For mood control  Dispense: 30 tablet; Refill: 0  6. Kidney disease - Ambulatory referral to Nephrology - Comprehensive metabolic panel - CBC with Differential/Platelet  7. Encounter for examination following treatment at hospital Consulted with Clinical pharmacist [place] [facility]  8. Anemia, unspecified type - CBC with Differential/Platelet     Patient have been counseled extensively about nutrition and exercise  Return for 2-3 weeks with LLurena Joiner  3 months with PCP.  The patient was given clear instructions to go to ER or return to medical center if symptoms don't improve, worsen or new problems develop. The patient verbalized understanding. The patient was told to call to get lab results if they haven't heard anything in the next week.     AFreeman Caldron PA-C [facility] [place]  10/21/2019, 3:28 PM

## 2019-10-21 NOTE — Progress Notes (Signed)
Has some concerns regarding her blood pressure and blood sugars being elevated   States needs refills on all meds and insulin needles

## 2019-10-21 NOTE — Patient Instructions (Signed)
Drink more water!!!!  Check blood pressure 3 times weekly and record and have available for next televisit  Urinary Tract Infection, Adult A urinary tract infection (UTI) is an infection of any part of the urinary tract. The urinary tract includes:  The kidneys.  The ureters.  The bladder.  The urethra. These organs make, store, and get rid of pee (urine) in the body. What are the causes? This is caused by germs (bacteria) in your genital area. These germs grow and cause swelling (inflammation) of your urinary tract. What increases the risk? You are more likely to develop this condition if:  You have a small, thin tube (catheter) to drain pee.  You cannot control when you pee or poop (incontinence).  You are female, and: ? You use these methods to prevent pregnancy:  A medicine that kills sperm (spermicide).  A device that blocks sperm (diaphragm). ? You have low levels of a female hormone (estrogen). ? You are pregnant.  You have genes that add to your risk.  You are sexually active.  You take antibiotic medicines.  You have trouble peeing because of: ? A prostate that is bigger than normal, if you are female. ? A blockage in the part of your body that drains pee from the bladder (urethra). ? A kidney stone. ? A nerve condition that affects your bladder (neurogenic bladder). ? Not getting enough to drink. ? Not peeing often enough.  You have other conditions, such as: ? Diabetes. ? A weak disease-fighting system (immune system). ? Sickle cell disease. ? Gout. ? Injury of the spine. What are the signs or symptoms? Symptoms of this condition include:  Needing to pee right away (urgently).  Peeing often.  Peeing small amounts often.  Pain or burning when peeing.  Blood in the pee.  Pee that smells bad or not like normal.  Trouble peeing.  Pee that is cloudy.  Fluid coming from the vagina, if you are female.  Pain in the belly or lower back. Other  symptoms include:  Throwing up (vomiting).  No urge to eat.  Feeling mixed up (confused).  Being tired and grouchy (irritable).  A fever.  Watery poop (diarrhea). How is this treated? This condition may be treated with:  Antibiotic medicine.  Other medicines.  Drinking enough water. Follow these instructions at home:  Medicines  Take over-the-counter and prescription medicines only as told by your doctor.  If you were prescribed an antibiotic medicine, take it as told by your doctor. Do not stop taking it even if you start to feel better. General instructions  Make sure you: ? Pee until your bladder is empty. ? Do not hold pee for a long time. ? Empty your bladder after sex. ? Wipe from front to back after pooping if you are a female. Use each tissue one time when you wipe.  Drink enough fluid to keep your pee pale yellow.  Keep all follow-up visits as told by your doctor. This is important. Contact a doctor if:  You do not get better after 1-2 days.  Your symptoms go away and then come back. Get help right away if:  You have very bad back pain.  You have very bad pain in your lower belly.  You have a fever.  You are sick to your stomach (nauseous).  You are throwing up. Summary  A urinary tract infection (UTI) is an infection of any part of the urinary tract.  This condition is caused by germs  in your genital area.  There are many risk factors for a UTI. These include having a small, thin tube to drain pee and not being able to control when you pee or poop.  Treatment includes antibiotic medicines for germs.  Drink enough fluid to keep your pee pale yellow. This information is not intended to replace advice given to you by your health care provider. Make sure you discuss any questions you have with your health care provider. Document Revised: 01/15/2018 Document Reviewed: 08/07/2017 Elsevier Patient Education  2020 [employer].

## 2019-10-22 ENCOUNTER — Telehealth: Payer: Self-pay | Admitting: Family Medicine

## 2019-10-22 ENCOUNTER — Ambulatory Visit: Payer: Medicaid Other | Admitting: Family

## 2019-10-22 LAB — COMPREHENSIVE METABOLIC PANEL
ALT: 16 IU/L (ref 0–32)
AST: 18 IU/L (ref 0–40)
Albumin/Globulin Ratio: 1.2 (ref 1.2–2.2)
Albumin: 3.9 g/dL (ref 3.8–4.9)
Alkaline Phosphatase: 103 IU/L (ref 48–121)
BUN/Creatinine Ratio: 16 (ref 9–23)
BUN: 29 mg/dL — ABNORMAL HIGH (ref 6–24)
Bilirubin Total: 0.5 mg/dL (ref 0.0–1.2)
CO2: 26 mmol/L (ref 20–29)
Calcium: 9.5 mg/dL (ref 8.7–10.2)
Chloride: 105 mmol/L (ref 96–106)
Creatinine, Ser: 1.79 mg/dL — ABNORMAL HIGH (ref 0.57–1.00)
GFR calc Af Amer: 35 mL/min/{1.73_m2} — ABNORMAL LOW (ref >59)
GFR calc non Af Amer: 31 mL/min/{1.73_m2} — ABNORMAL LOW (ref >59)
Globulin, Total: 3.3 g/dL (ref 1.5–4.5)
Glucose: 94 mg/dL (ref 65–99)
Potassium: 4.4 mmol/L (ref 3.5–5.2)
Sodium: 141 mmol/L (ref 134–144)
Total Protein: 7.2 g/dL (ref 6.0–8.5)

## 2019-10-22 LAB — CBC WITH DIFFERENTIAL/PLATELET
Basophils Absolute: 0 10*3/uL (ref 0.0–0.2)
Basos: 1 %
EOS (ABSOLUTE): 0.2 10*3/uL (ref 0.0–0.4)
Eos: 3 %
Hematocrit: 30.5 % — ABNORMAL LOW (ref 34.0–46.6)
Hemoglobin: 10.1 g/dL — ABNORMAL LOW (ref 11.1–15.9)
Immature Grans (Abs): 0 10*3/uL (ref 0.0–0.1)
Immature Granulocytes: 0 %
Lymphocytes Absolute: 2.1 10*3/uL (ref 0.7–3.1)
Lymphs: 31 %
MCH: 29.4 pg (ref 26.6–33.0)
MCHC: 33.1 g/dL (ref 31.5–35.7)
MCV: 89 fL (ref 79–97)
Monocytes Absolute: 0.7 10*3/uL (ref 0.1–0.9)
Monocytes: 11 %
Neutrophils Absolute: 3.6 10*3/uL (ref 1.4–7.0)
Neutrophils: 54 %
Platelets: 270 10*3/uL (ref 150–450)
RBC: 3.44 x10E6/uL — ABNORMAL LOW (ref 3.77–5.28)
RDW: 13.2 % (ref 11.7–15.4)
WBC: 6.6 10*3/uL (ref 3.4–10.8)

## 2019-10-22 LAB — CERVICOVAGINAL ANCILLARY ONLY
Bacterial Vaginitis (gardnerella): POSITIVE — AB
Candida Glabrata: NEGATIVE
Candida Vaginitis: NEGATIVE
Chlamydia: NEGATIVE
Comment: NEGATIVE
Comment: NEGATIVE
Comment: NEGATIVE
Comment: NEGATIVE
Comment: NEGATIVE
Comment: NORMAL
Neisseria Gonorrhea: NEGATIVE
Trichomonas: NEGATIVE

## 2019-10-22 NOTE — Telephone Encounter (Signed)
Patient is calling for lab results. Called the office three times with no answer. Please advise [phone]

## 2019-10-24 LAB — URINE CULTURE

## 2019-10-25 NOTE — Telephone Encounter (Signed)
Patient was given lab results by [facility].

## 2019-10-27 ENCOUNTER — Other Ambulatory Visit: Payer: Self-pay | Admitting: Physician Assistant

## 2019-10-27 MED ORDER — METRONIDAZOLE 500 MG PO TABS: 500.0000 mg | ORAL_TABLET | Freq: Two times a day (BID) | ORAL | 0 refills | Status: DC

## 2019-10-27 MED ORDER — FLUCONAZOLE 150 MG PO TABS: 150.0000 mg | ORAL_TABLET | Freq: Once | ORAL | 0 refills | Status: AC

## 2019-11-12 ENCOUNTER — Ambulatory Visit: Payer: Medicaid Other | Attending: Family Medicine | Admitting: Pharmacist

## 2019-11-12 ENCOUNTER — Encounter: Payer: Self-pay | Admitting: Pharmacist

## 2019-11-12 ENCOUNTER — Other Ambulatory Visit: Payer: Self-pay

## 2019-11-12 VITALS — BP 153/79

## 2019-11-12 DIAGNOSIS — I10 Essential (primary) hypertension: Secondary | ICD-10-CM | POA: Diagnosis not present

## 2019-11-12 MED ORDER — AMLODIPINE BESYLATE 10 MG PO TABS: 10.0000 mg | ORAL_TABLET | Freq: Every day | ORAL | 1 refills | Status: DC

## 2019-11-12 NOTE — Progress Notes (Signed)
   S:    PCP: Dr. Margarita Rana  Patient arrives in good spirits. Presents to the clinic for hypertension evaluation, counseling, and management. Patient was referred on 10/21/19 by Freeman Caldron. BP was severely elevated at that appointment. Amlodipine was added to her regimen.   Medication adherence reported. Pt denies chest pains, dyspnea, blurred vision or HA.   Current BP Medications include:  Amlodipine 5 mg daily, lisinopril 20 mg daily  Antihypertensives tried in the past include: lisinopril at lower doses   Dietary habits include: has decreased amount of processed foods; does not drink caffeine  Exercise habits include: none  Family / Social history:  - Fhx: DM, heart disease, kidney disease, stroke, HLD - Tobacco: former smoker (quit in 2015) - Alcohol: occasionally   O:  Vitals:   11/12/19 1414  BP: (!) 153/79   Home BP readings:  - SBPs since amlodipine down to 150s-180s - No DBPs recorded   Last 3 Office BP readings: BP Readings from Last 3 Encounters:  11/12/19 (!) 153/79  10/21/19 (!) 211/89  09/30/19 (!) 226/88    BMET    Component Value Date/Time   NA 141 10/21/2019 1531   K 4.4 10/21/2019 1531   CL 105 10/21/2019 1531   CO2 26 10/21/2019 1531   GLUCOSE 94 10/21/2019 1531   GLUCOSE 62 (L) 07/29/2019 1508   BUN 29 (H) 10/21/2019 1531   CREATININE 1.79 (H) 10/21/2019 1531   CREATININE 0.65 04/09/2016 1150   CALCIUM 9.5 10/21/2019 1531   GFRNONAA 31 (L) 10/21/2019 1531   GFRNONAA >89 04/09/2016 1150   GFRAA 35 (L) 10/21/2019 1531   GFRAA >89 04/09/2016 1150    Renal function: CrCl cannot be calculated (Patient's most recent lab result is older than the maximum 21 days allowed.).  Clinical ASCVD: No  The 10-year ASCVD risk score Mikey Bussing DC Jr., et al., 2013) is: 24.5%   Values used to calculate the score:     Age: 59 years     Sex: Female     Is Non-Hispanic African American: Yes     Diabetic: Yes     Tobacco smoker: No     Systolic Blood Pressure:  153 mmHg     Is BP treated: Yes     HDL Cholesterol: 61 mg/dL     Total Cholesterol: 235 mg/dL  A/P: Hypertension longstanding currently above goal but improved on current medications. BP Goal = < 130/80 mmHg. Medication adherence reported.  -Increased dose of amlodipine to 10 mg daily. -Continue lisinopril 20 mg daily.   -Counseled on lifestyle modifications for blood pressure control including reduced dietary sodium, increased exercise, adequate sleep.  Results reviewed and written information provided.   Total time in face-to-face counseling 30 minutes.   F/U Clinic Visit in 3 weeks with PharmD.  Benard Halsted, PharmD, [place] [phone]

## 2019-11-17 ENCOUNTER — Ambulatory Visit: Payer: Medicaid Other | Attending: Family Medicine | Admitting: Licensed Clinical Social Worker

## 2019-11-17 ENCOUNTER — Other Ambulatory Visit: Payer: Self-pay

## 2019-11-17 DIAGNOSIS — F331 Major depressive disorder, recurrent, moderate: Secondary | ICD-10-CM

## 2019-11-17 DIAGNOSIS — F411 Generalized anxiety disorder: Secondary | ICD-10-CM

## 2019-12-03 ENCOUNTER — Ambulatory Visit: Payer: Medicaid Other | Admitting: Pharmacist

## 2019-12-07 NOTE — BH Specialist Note (Signed)
Integrated Behavioral Health Initial Visit  MRN: [id number] Name: Lorraine Turner  Number of [place] Clinician visits:: 1/6 Session Start time: 2:40 PM  Session End time: 3:15 PM Total time: 35   Type of Service: [place] Interpretor:No. Interpretor Name and Language: NA    SUBJECTIVE: Lorraine Turner is a 59 y.o. female accompanied by self Patient was referred by Weyman Pedro for depression and anxiety. Patient reports the following symptoms/concerns: Pt reports difficulty managing depression and anxiety symptoms, in addition, to health triggered by stress. Pt is experiencing financial strain and is concerned about losing house. She assists in caring for grandchildren resulting in limited time for self Duration of problem: Ongoing; Severity of problem: moderate  OBJECTIVE: Mood: Anxious and Affect: Appropriate Risk of harm to self or others: No plan to harm self or others  LIFE CONTEXT: Family and Social: Pt has adult children and grandchildren, that pt watches during the day. She receives limited support School/Work: Pt has no income Self-Care: Pt denies substance use. She is participating in medication management through PCP Life Changes: Pt is experiencing financial strain that may result in loss of housing. She receives limited support resulting in increase in mental health symptoms and difficulty managing medical health conditions  STRENGTHS: Pt is participating in medication management through PCP Pt has sense of purpose  GOALS ADDRESSED: Patient will: 1. Reduce symptoms of: anxiety and depression Pt agreed to continue with medication management through PCP 2. Increase knowledge and/or ability of: stress reduction Pt agreed to utilize strategies discussed to manage stress  3. Demonstrate ability to: Increase healthy adjustment to current life circumstances and Increase adequate support systems for patient/family Pt  agreed to contact [employer] to complete housing application. Pt was encouraged to complete application on-line   PROGRESS OF GOALS: Ongoing  INTERVENTIONS: Interventions utilized: Solution-Focused Strategies, Mindfulness or Psychologist, educational, Supportive Counseling and Link to [employer]  Standardized Assessments completed: Not Needed  ASSESSMENT: Patient currently experiencing difficulty managing mental and physical health conditions triggered by psychosocial stressors.   Patient may benefit from continued medication management through PCP. Healthy strategies to assist with stress management identified. LCSW discussed benefits of applying for housing through [employer], pt agreed to complete Museum/gallery curator.   PLAN: 1. Follow up with behavioral health clinician on : Contact LCSW with any additional behavioral health and/or resource needs 2. Behavioral recommendations: Utilize strategies discussed, continue with medication management, and complete housing application with [employer] 3. Referral(s): [place] (In Clinic) and [employer] Resources:  Housing 4. "From scale of 1-10, how likely are you to follow plan?":   Rebekah Chesterfield, LCSW 12/07/19 1:01 AM

## 2019-12-08 ENCOUNTER — Ambulatory Visit: Payer: Medicaid Other | Admitting: Pharmacist

## 2019-12-14 NOTE — Progress Notes (Signed)
S:    PCP: Dr. Margarita Rana PMH: HTN, T2DM, HLD, Schizophrenia, MDD, anxiety   Patient arrives in good spirits. Presents to the clinic for hypertension evaluation, counseling, and management. Patient was referred on 10/21/19 by Freeman Caldron. Last seen by clinical pharmacist on 11/12/19 in which BP 153/79 was elevated. Amlodipine was increased to 10 mg daily.  Today, patient reports medication adherence with amlodipine and lisinopril in mornings. Reports no missed doses. Reports bilateral LE swelling since increasing amlodipine from 5 mg to 10 mg. Reports no swelling when on amlodipine 5 mg. Reports checking BP sometimes and last time checked, SBP was 146 sometime last week. Denies dizziness, headaches, and blurred vision.   Medication adherence reported.  Current BP Medications include: Amlodipine 10 mg daily (AM), lisinopril 20 mg daily  Antihypertensives tried in the past include: lisinopril at lower doses   Dietary habits include: has decreased amount of processed foods; does not drink caffeine  Exercise habits include: none  Family / Social history:  - Fhx: DM, heart disease, kidney disease, stroke, HLD - Tobacco: former smoker (quit in 2015) - Alcohol: occasionally    O:  Vitals:   12/15/19 1523 12/15/19 1524  BP: (!) 171/74 (!) 175/71  Pulse: 62     Home BP readings: SBP 146  Last 3 Office BP readings: BP Readings from Last 3 Encounters:  12/15/19 (!) 175/71  11/12/19 (!) 153/79  10/21/19 (!) 211/89    BMET    Component Value Date/Time   NA 141 10/21/2019 1531   K 4.4 10/21/2019 1531   CL 105 10/21/2019 1531   CO2 26 10/21/2019 1531   GLUCOSE 94 10/21/2019 1531   GLUCOSE 62 (L) 07/29/2019 1508   BUN 29 (H) 10/21/2019 1531   CREATININE 1.79 (H) 10/21/2019 1531   CREATININE 0.65 04/09/2016 1150   CALCIUM 9.5 10/21/2019 1531   GFRNONAA 31 (L) 10/21/2019 1531   GFRNONAA >89 04/09/2016 1150   GFRAA 35 (L) 10/21/2019 1531   GFRAA >89 04/09/2016 1150    Renal  function: CrCl cannot be calculated (Patient's most recent lab result is older than the maximum 21 days allowed.).  Clinical ASCVD: No  The 10-year ASCVD risk score Mikey Bussing DC Jr., et al., 2013) is: 35%   Values used to calculate the score:     Age: 59 years     Sex: Female     Is Non-Hispanic African American: Yes     Diabetic: Yes     Tobacco smoker: No     Systolic Blood Pressure: 240 mmHg     Is BP treated: Yes     HDL Cholesterol: 61 mg/dL     Total Cholesterol: 235 mg/dL  A/P: Hypertensionlongstandingcurrentlyuncontrolledon current medications. BP Goal = <130/80 mmHg. Medication adherence appears optimal. Patient experiencing new onset bilateral LE swelling since increasing amlodipine from 5 mg to 10 mg. Patient states she did not experience any swelling while on amlodipine 5 mg. Will decrease amlodipine from 10 mg back to 5 mg daily and increase lisinopril from 20 mg to 40 mg for BP management. Patient verbalized agreement with medication changes. Discussed importance of checking BP daily at home at least 1 hour after taking medicationsand bring log to next visit. Patient verbalized understanding.Will plan to check BMET at next visit due to dose increase of lisinopril.  -Decreased amlodipine to 5 mg daily -Increased lisinopril to 40 mg daily -F/u labs ordered - BMET at next visit -Counseled on lifestyle modifications for blood pressure control including  reduced dietary sodium, increased exercise, adequate sleep.  Results reviewed and written information provided.  Total time in face-to-face counseling 30 minutes.   F/U Clinic Visit in 2 weeks.   Lorel Monaco, PharmD PGY2 Ambulatory Care Resident [place]

## 2019-12-15 ENCOUNTER — Other Ambulatory Visit: Payer: Self-pay | Admitting: Pharmacist

## 2019-12-15 ENCOUNTER — Encounter: Payer: Self-pay | Admitting: Pharmacist

## 2019-12-15 ENCOUNTER — Other Ambulatory Visit: Payer: Self-pay

## 2019-12-15 ENCOUNTER — Ambulatory Visit: Payer: Medicaid Other | Attending: Family Medicine | Admitting: Pharmacist

## 2019-12-15 VITALS — BP 175/71 | HR 62

## 2019-12-15 DIAGNOSIS — I10 Essential (primary) hypertension: Secondary | ICD-10-CM | POA: Diagnosis not present

## 2019-12-15 MED ORDER — AMLODIPINE BESYLATE 5 MG PO TABS: 5.0000 mg | ORAL_TABLET | Freq: Every day | ORAL | 1 refills | Status: DC

## 2019-12-15 MED ORDER — LISINOPRIL 40 MG PO TABS: 40.0000 mg | ORAL_TABLET | Freq: Every day | ORAL | 3 refills | Status: DC

## 2019-12-28 NOTE — Progress Notes (Signed)
S:    PCP: Dr. Margarita Rana PMH: HTN, T2DM, HLD, Schizophrenia, MDD, anxiety   Patient arrives in good spirits. Presents to the clinic for hypertension evaluation, counseling, and management.   Patient was referred on9/9/21 by Freeman Caldron.Last seen by clinical pharmacist on 12/15/19 at which BP was elevated at 175/71 and reported new onset bilateral LE swelling with higher dose of amlodipine 10 mg daily. Amlodipine was decreased back to 5 mg daily and lisinopril increased from 20 to 40 mg daily.  Today, patient reports medication adherence with lisinopril 40 mg daily, however did not restart amlodipine 5 mg daily as prescribed because she thought it was discontinued. Reports LE swelling resolved. Denies dizziness, headaches, and blurred vision. Patient did not bring BP log, however reports SBP 154 and 148 and believes BP has been high due to very stressful environment and family factors - on verge of being homeless, but working with someone to set her up in a shelter in [place].   Medication adherence good with lisinopril, however did not restart amlodipine. Current BP Medications include:  Amlodipine 5 mg daily (not taking), lisinopril 40 mg daily  Antihypertensives tried in the past include: amlodipine 10 mg (bilateral LE swelling), lisinopril at lower doses  Dietary habits include: has decreased amount of processed foods; does not drink caffeine Exercise habits include:none Family / Social history:  - Fhx: DM, heart disease, kidney disease, stroke, HLD - Tobacco: former smoker (quit in 2015) - Alcohol:occasionally  O:   Home SBP readings: 154, 148  Last 3 Office BP readings: BP Readings from Last 3 Encounters:  12/29/19 (!) 183/90  12/15/19 (!) 175/71  11/12/19 (!) 153/79    BMET    Component Value Date/Time   NA 141 10/21/2019 1531   K 4.4 10/21/2019 1531   CL 105 10/21/2019 1531   CO2 26 10/21/2019 1531   GLUCOSE 94 10/21/2019 1531   GLUCOSE 62 (L) 07/29/2019  1508   BUN 29 (H) 10/21/2019 1531   CREATININE 1.79 (H) 10/21/2019 1531   CREATININE 0.65 04/09/2016 1150   CALCIUM 9.5 10/21/2019 1531   GFRNONAA 31 (L) 10/21/2019 1531   GFRNONAA >89 04/09/2016 1150   GFRAA 35 (L) 10/21/2019 1531   GFRAA >89 04/09/2016 1150    Renal function: CrCl cannot be calculated (Patient's most recent lab result is older than the maximum 21 days allowed.).  Clinical ASCVD: No  The 10-year ASCVD risk score Mikey Bussing DC Jr., et al., 2013) is: 40.2%   Values used to calculate the score:     Age: 59 years     Sex: Female     Is Non-Hispanic African American: Yes     Diabetic: Yes     Tobacco smoker: No     Systolic Blood Pressure: 400 mmHg     Is BP treated: Yes     HDL Cholesterol: 61 mg/dL     Total Cholesterol: 235 mg/dL   A/P: Hypertension longstanding currently uncontrolled on current medications. BP Goal = <130/80 mmHg. Medication adherence appears optimal with lisinopril, however did not restart amlodipine 5 mg daily. Explained prescription of amlodipine has been sent to pharmacy and should be ready for pick up. Patient verbalized understanding. Discussed importance of checking BP daily at home at least 1 hour after taking medicationsand bring log to next visit. Will plan to check BMET today due to dose increase of lisinopril.  -Restarted amlodipine 5 mg daily -Continued lisinopril 40 mg daily -Labs today - BMET -Counseled on lifestyle modifications  for blood pressure control including reduced dietary sodium, increased exercise, adequate sleep.  Results reviewed and written information provided. Total time in face-to-face counseling 25 minutes.   F/U Clinic Visit in 2 weeks   Lorel Monaco, PharmD PGY2 Ambulatory Care Resident [place]

## 2019-12-29 ENCOUNTER — Ambulatory Visit: Payer: Medicaid Other | Attending: Family Medicine | Admitting: Pharmacist

## 2019-12-29 ENCOUNTER — Encounter: Payer: Self-pay | Admitting: Pharmacist

## 2019-12-29 ENCOUNTER — Other Ambulatory Visit: Payer: Self-pay

## 2019-12-29 DIAGNOSIS — I10 Essential (primary) hypertension: Secondary | ICD-10-CM

## 2019-12-30 ENCOUNTER — Other Ambulatory Visit: Payer: Self-pay | Admitting: Family Medicine

## 2019-12-30 DIAGNOSIS — N184 Chronic kidney disease, stage 4 (severe): Secondary | ICD-10-CM

## 2019-12-30 LAB — BASIC METABOLIC PANEL
BUN/Creatinine Ratio: 15 (ref 9–23)
BUN: 31 mg/dL — ABNORMAL HIGH (ref 6–24)
CO2: 24 mmol/L (ref 20–29)
Calcium: 9.4 mg/dL (ref 8.7–10.2)
Chloride: 107 mmol/L — ABNORMAL HIGH (ref 96–106)
Creatinine, Ser: 2.1 mg/dL — ABNORMAL HIGH (ref 0.57–1.00)
GFR calc Af Amer: 29 mL/min/{1.73_m2} — ABNORMAL LOW (ref >59)
GFR calc non Af Amer: 25 mL/min/{1.73_m2} — ABNORMAL LOW (ref >59)
Glucose: 138 mg/dL — ABNORMAL HIGH (ref 65–99)
Potassium: 4.8 mmol/L (ref 3.5–5.2)
Sodium: 142 mmol/L (ref 134–144)

## 2019-12-31 ENCOUNTER — Telehealth: Payer: Self-pay

## 2019-12-31 NOTE — Telephone Encounter (Signed)
Patient name and DOB has been verified Patient was informed of lab results. Patient had no questions.  

## 2019-12-31 NOTE — Telephone Encounter (Signed)
-----   Message from [name], MD sent at 12/30/2019  1:38 PM EST ----- Labs reveal abnormal kidney function likely from her diabetes.  I have placed a referral to a nephrologist for her.

## 2020-01-12 ENCOUNTER — Ambulatory Visit: Payer: Medicaid Other | Attending: Family Medicine | Admitting: Pharmacist

## 2020-01-12 ENCOUNTER — Encounter: Payer: Self-pay | Admitting: Pharmacist

## 2020-01-12 ENCOUNTER — Other Ambulatory Visit: Payer: Self-pay

## 2020-01-12 VITALS — BP 160/70 | HR 66

## 2020-01-12 DIAGNOSIS — I10 Essential (primary) hypertension: Secondary | ICD-10-CM

## 2020-01-12 NOTE — Progress Notes (Signed)
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 Presents to the clinic for hypertension evaluation, counseling, and management. Patient was referred on9/9/21 by Freeman Caldron and last seen by clinical pharmacist on 12/29/19 at which BP was elevated at 183/90 and restarted on amlodipine 5 mg daily. Additionally, PCP sent referral to a nephrologist for abnormal kidney function likely secondary due to diabetes.   Today, patient reports medication adherence with BP medications - takes 0.5 tablet of amlodipine 10 mg daily (5 mg total) and 2 tablets of lisinopril 20 mg daily (40 mg total). Patient aware updated prescriptions of amlodipine 5 mg daily (1 tablet) and lisinopril 40 mg daily (1 tablet) have been sent to pharmacy. Denies headaches, dizziness, blurred vision and LE swelling. Unable to check BP at home as son left with BP monitor.  Medication adherence reported good.  Current BP Medications include:  Amlodipine 5 mg daily, lisinopril 40 mg daily  Antihypertensives tried in the past include: amlodipine 10 mg (bilateral LE swelling), lisinopril at lower doses  Dietary habits include: has decreased amount of processed foods; does not drink caffeine Exercise habits include:none Family / Social history:  - Fhx: DM, heart disease, kidney disease, stroke, HLD - Tobacco: former smoker (quit in 2015) - Alcohol:occasionally - Social: on verge of homelessness, but working with someone to set her up in a shelter in [place].   O:   Home SBP readings: none  Last 3 Office BP readings: BP Readings from Last 3 Encounters:  01/12/20 (!) 160/70  12/29/19 (!) 183/90  12/15/19 (!) 175/71    BMET    Component Value Date/Time   NA 142 12/29/2019 1459   K 4.8 12/29/2019 1459   CL 107 (H) 12/29/2019 1459   CO2 24 12/29/2019 1459   GLUCOSE 138 (H) 12/29/2019 1459   GLUCOSE 62 (L) 07/29/2019 1508   BUN 31 (H) 12/29/2019 1459     CREATININE 2.10 (H) 12/29/2019 1459   CREATININE 0.65 04/09/2016 1150   CALCIUM 9.4 12/29/2019 1459   GFRNONAA 25 (L) 12/29/2019 1459   GFRNONAA >89 04/09/2016 1150   GFRAA 29 (L) 12/29/2019 1459   GFRAA >89 04/09/2016 1150    Renal function: CrCl cannot be calculated (Unknown ideal weight.).  Clinical ASCVD: No  The 10-year ASCVD risk score Mikey Bussing DC Jr., et al., 2013) is: 28.9%   Values used to calculate the score:     Age: 56 years     Sex: Female     Is Non-Hispanic African American: Yes     Diabetic: Yes     Tobacco smoker: No     Systolic Blood Pressure: 409 mmHg     Is BP treated: Yes     HDL Cholesterol: 61 mg/dL     Total Cholesterol: 235 mg/dL  A/P: Hypertension longstanding currently uncontrolled on current medications, however BP today of 160/70 has improved since last visit (183/90). BP Goal = <130/80 mmHg. Medication adherence appears optimal. Discussed starting a new antihypertensive medication today however, patient requested to work on diet (apple cide vinegar and milk) for the next 2 weeks before starting a new medication. Will make no medication changes today. Can consider starting hydralazine 25 mg TID for additional BP control at next visit.  -Continued amlodipine 5 mg daily -Continued lisinopril 40 mg daily -Counseled on lifestyle modifications for blood pressure control including reduced dietary sodium, increased exercise, adequate sleep.  Results reviewed and  written information provided. Total time in face-to-face counseling 25 minutes.   F/U Clinic Visit in 2 weeks.  Lorel Monaco, PharmD, [place] PGY2 Ambulatory Care Resident [place]

## 2020-01-13 ENCOUNTER — Encounter: Payer: Self-pay | Admitting: Pharmacist

## 2020-01-20 ENCOUNTER — Ambulatory Visit: Payer: Medicaid Other | Admitting: Family Medicine

## 2020-01-24 ENCOUNTER — Ambulatory Visit: Payer: Medicaid Other | Attending: Family Medicine | Admitting: Family Medicine

## 2020-01-24 ENCOUNTER — Other Ambulatory Visit: Payer: Self-pay

## 2020-01-24 ENCOUNTER — Encounter: Payer: Self-pay | Admitting: Family Medicine

## 2020-01-24 VITALS — BP 181/62 | HR 71 | Ht 62.0 in | Wt 150.8 lb

## 2020-01-24 DIAGNOSIS — Z79899 Other long term (current) drug therapy: Secondary | ICD-10-CM | POA: Insufficient documentation

## 2020-01-24 DIAGNOSIS — Z794 Long term (current) use of insulin: Secondary | ICD-10-CM | POA: Insufficient documentation

## 2020-01-24 DIAGNOSIS — E1165 Type 2 diabetes mellitus with hyperglycemia: Secondary | ICD-10-CM | POA: Diagnosis present

## 2020-01-24 DIAGNOSIS — F251 Schizoaffective disorder, depressive type: Secondary | ICD-10-CM

## 2020-01-24 DIAGNOSIS — N184 Chronic kidney disease, stage 4 (severe): Secondary | ICD-10-CM

## 2020-01-24 DIAGNOSIS — Z833 Family history of diabetes mellitus: Secondary | ICD-10-CM | POA: Diagnosis not present

## 2020-01-24 DIAGNOSIS — E1122 Type 2 diabetes mellitus with diabetic chronic kidney disease: Secondary | ICD-10-CM

## 2020-01-24 DIAGNOSIS — I129 Hypertensive chronic kidney disease with stage 1 through stage 4 chronic kidney disease, or unspecified chronic kidney disease: Secondary | ICD-10-CM

## 2020-01-24 LAB — POCT GLYCOSYLATED HEMOGLOBIN (HGB A1C): HbA1c, POC (controlled diabetic range): 6.7 % (ref 0.0–7.0)

## 2020-01-24 LAB — GLUCOSE, POCT (MANUAL RESULT ENTRY): POC Glucose: 142 mg/dl — AB (ref 70–99)

## 2020-01-24 MED ORDER — CARVEDILOL 3.125 MG PO TABS: 3.1250 mg | ORAL_TABLET | Freq: Two times a day (BID) | ORAL | 6 refills | Status: DC

## 2020-01-24 NOTE — Progress Notes (Signed)
Subjective:  Patient ID: Lorraine Turner, female    DOB: [birth year]  Age: 59 y.o. MRN: [id number]  CC: Diabetes   HPI [name] is a 59 year old female with a history of schizoaffective affective disorder, hypertension, diabetic neuropathy, type 2 diabetes mellitus (A1c6.7), stage IV CKD who presents today for a follow-up visit.   She is stressed, baby sits everyday at home. Complains she needs to needs to get away from her house Amlodipine was causing her legs to swell and her dose was decreased to 5 mg with improvement in pedal edema.  Her blood pressure is elevated and she endorses compliance with her antihypertensive and adherence to low-sodium diet. She has been out of Effexor. States she has not followed up with her Psychiatrist. Previously went to [facility] and now goes to [facility]  With regard to chronic kidney disease, her creatinine revealed a significant rise from 1.79-2.10 in 2 months and 1 year ago her baseline was at 1.18; she was referred to nephrology. States she is scared to call nephrology due to fear of the unknown. Compliant with her diabetic medications and has no symptoms of hypoglycemia. Past Medical History:  Diagnosis Date  . Anxiety   . Bipolar 1 disorder ([place])   . Depression   . Diabetes mellitus without complication ([place])   . Gallstones   . Hyperlipidemia   . Hypertension   . Neuropathy   . Schizophrenia ([place])   . Seizures ([place])    X1- febrile seizure as a child-none since    Past Surgical History:  Procedure Laterality Date  . CESAREAN SECTION  05/1998   X 1  . CHOLECYSTECTOMY N/A 03/27/2016   Procedure: LAPAROSCOPIC CHOLECYSTECTOMY;  Surgeon: Olean Ree, MD;  Location: [facility] ORS;  Service: General;  Laterality: N/A;  . TONSILLECTOMY AND ADENOIDECTOMY     age 57  . TUBAL LIGATION      Family History  Problem Relation Age of Onset  . Diabetes Mother   . Heart disease Mother   . Kidney disease Mother   .  Stroke Mother   . Hyperlipidemia Mother   . Diabetes Maternal Aunt     No Known Allergies  Outpatient Medications Prior to Visit  Medication Sig Dispense Refill  . amLODipine (NORVASC) 5 MG tablet Take 1 tablet (5 mg total) by mouth daily. 90 tablet 1  . atorvastatin (LIPITOR) 40 MG tablet Take 1 tablet (40 mg total) by mouth daily. 30 tablet 6  . Blood Glucose Monitoring Suppl (TRUE METRIX METER) w/Device KIT 1 Device by Does not apply route 3 (three) times daily. For blood sugar checks 1 kit 0  . glucose blood test strip Use as instructed 100 each 12  . insulin glargine (LANTUS) 100 UNIT/ML injection Inject 0.15 mLs (15 Units total) into the skin at bedtime. 10 mL 11  . Insulin Syringe-Needle U-100 (SAFETY INSULIN SYRINGES) 30G X 5/16" 0.5 ML MISC 1 each by Does not apply route at bedtime. 100 each 3  . lisinopril (ZESTRIL) 40 MG tablet Take 1 tablet (40 mg total) by mouth daily. For high blood pressure 90 tablet 3  . loperamide (IMODIUM) 2 MG capsule Take 1 capsule (2 mg total) by mouth 4 (four) times daily as needed for diarrhea or loose stools. 12 capsule 0  . ondansetron (ZOFRAN ODT) 4 MG disintegrating tablet Take 1 tablet (4 mg total) by mouth every 8 (eight) hours as needed for nausea or vomiting. 10 tablet 0  .  TRUEplus Lancets 28G MISC 1 each by Does not apply route 3 (three) times daily. For blood sugar checks 100 each 12  . bacitracin ointment Apply 1 application topically 2 (two) times daily. (Patient not taking: Reported on 01/24/2020) 14 g 0  . gabapentin (NEURONTIN) 100 MG capsule Take 1 capsule (100 mg total) by mouth 3 (three) times daily. 90 capsule 2  . glipiZIDE (GLUCOTROL) 10 MG tablet Take 1 tablet (10 mg total) by mouth 2 (two) times daily before a meal. 60 tablet 6  . metroNIDAZOLE (FLAGYL) 500 MG tablet Take 1 tablet (500 mg total) by mouth 2 (two) times daily. (Patient not taking: Reported on 01/24/2020) 14 tablet 0  . nitrofurantoin, macrocrystal-monohydrate,  (MACROBID) 100 MG capsule Take 1 capsule (100 mg total) by mouth 2 (two) times daily. (Patient not taking: Reported on 01/24/2020) 10 capsule 0  . QUEtiapine (SEROQUEL) 100 MG tablet Take 1 tablet (100 mg total) by mouth at bedtime. For mood control (Patient not taking: Reported on 01/24/2020) 30 tablet 0  . venlafaxine XR (EFFEXOR XR) 75 MG 24 hr capsule Take 1 capsule (75 mg total) by mouth daily with breakfast. For depression (Patient not taking: Reported on 01/24/2020) 60 capsule 6   No facility-administered medications prior to visit.     ROS Review of Systems  Constitutional: Negative for activity change, appetite change and fatigue.  HENT: Negative for congestion, sinus pressure and sore throat.   Eyes: Negative for visual disturbance.  Respiratory: Negative for cough, chest tightness, shortness of breath and wheezing.   Cardiovascular: Negative for chest pain and palpitations.  Gastrointestinal: Negative for abdominal distention, abdominal pain and constipation.  Endocrine: Negative for polydipsia.  Genitourinary: Negative for dysuria and frequency.  Musculoskeletal: Negative for arthralgias and back pain.  Skin: Negative for rash.  Neurological: Negative for tremors, light-headedness and numbness.  Hematological: Does not bruise/bleed easily.  Psychiatric/Behavioral: Negative for agitation and behavioral problems.    Objective:  BP (!) 181/62   Pulse 71   Ht _0  (1.575 m)   Wt 150 lb 12.8 oz (68.4 kg)   SpO2 100%   BMI 27.58 kg/m   BP/Weight 01/24/2020 01/12/2020 16/11/9602  Systolic BP 540 981 191  Diastolic BP 62 70 90  Wt. (Lbs) 150.8 - -  BMI 27.58 - -  Some encounter information is confidential and restricted. Go to Review Flowsheets activity to see all data.      Physical Exam Constitutional:      Appearance: She is well-developed.  Neck:     Vascular: No JVD.  Cardiovascular:     Rate and Rhythm: Normal rate.     Heart sounds: Normal heart sounds.  No murmur heard.   Pulmonary:     Effort: Pulmonary effort is normal.     Breath sounds: Normal breath sounds. No wheezing or rales.  Chest:     Chest wall: No tenderness.  Abdominal:     General: Bowel sounds are normal. There is no distension.     Palpations: Abdomen is soft. There is no mass.     Tenderness: There is no abdominal tenderness.  Musculoskeletal:        General: Normal range of motion.     Right lower leg: No edema.     Left lower leg: No edema.  Neurological:     Mental Status: She is alert and oriented to person, place, and time.  Psychiatric:        Mood and Affect: Mood normal.  CMP Latest Ref Rng & Units 12/29/2019 10/21/2019 07/29/2019  Glucose 65 - 99 mg/dL 138(H) 94 62(L)  BUN 6 - 24 mg/dL 31(H) 29(H) 32(H)  Creatinine 0.57 - 1.00 mg/dL 2.10(H) 1.79(H) 2.35(H)  Sodium 134 - 144 mmol/L 142 141 142  Potassium 3.5 - 5.2 mmol/L 4.8 4.4 4.3  Chloride 96 - 106 mmol/L 107(H) 105 108  CO2 20 - 29 mmol/L _0 Calcium 8.7 - 10.2 mg/dL 9.4 9.5 8.9  Total Protein 6.0 - 8.5 g/dL - 7.2 7.0  Total Bilirubin 0.0 - 1.2 mg/dL - 0.5 0.6  Alkaline Phos 48 - 121 IU/L - 103 89  AST 0 - 40 IU/L - 18 19  ALT 0 - 32 IU/L - 16 21    Lipid Panel     Component Value Date/Time   CHOL 235 (H) 01/13/2019 1507   TRIG 91 01/13/2019 1507   HDL 61 01/13/2019 1507   CHOLHDL 3.9 01/13/2019 1507   CHOLHDL 3.9 03/05/2018 0635   VLDL 14 03/05/2018 0635   LDLCALC 158 (H) 01/13/2019 1507    CBC    Component Value Date/Time   WBC 6.6 10/21/2019 1531   WBC 9.5 07/29/2019 1508   RBC 3.44 (L) 10/21/2019 1531   RBC 3.09 (L) 07/29/2019 1508   HGB 10.1 (L) 10/21/2019 1531   HCT 30.5 (L) 10/21/2019 1531   PLT 270 10/21/2019 1531   MCV 89 10/21/2019 1531   MCH 29.4 10/21/2019 1531   MCH 29.4 07/29/2019 1508   MCHC 33.1 10/21/2019 1531   MCHC 32.5 07/29/2019 1508   RDW 13.2 10/21/2019 1531   LYMPHSABS 2.1 10/21/2019 1531   MONOABS 0.6 07/18/2018 2001   EOSABS 0.2  10/21/2019 1531   BASOSABS 0.0 10/21/2019 1531    Lab Results  Component Value Date   HGBA1C 6.7 01/24/2020    Assessment & Plan:  1. Type 2 diabetes mellitus with hyperglycemia, with long-term current use of insulin (HCC) Controlled with A1c of 6.7 Continue current regimen Counseled on Diabetic diet, my plate method, 778 minutes of moderate intensity exercise/week Blood sugar logs with fasting goals of 80-120 mg/dl, random of less than 180 and in the event of sugars less than 60 mg/dl or greater than 400 mg/dl encouraged to notify the clinic. Advised on the need for annual eye exams, annual foot exams, Pneumonia vaccine. - POCT glucose (manual entry) - POCT glycosylated hemoglobin (Hb E4M) - Basic Metabolic Panel  2. Schizoaffective disorder, depressive type ([place]) She does have refills of Effexor at the pharmacy and has been advised to pick this up - Ambulatory referral to Psychiatry  3. CKD (chronic kidney disease), stage IV (HCC) Combination of hypertensive and diabetic nephropathy We will check renal function again Advised to call [facility] for an appointment and I have provided her with the phone number to do so  4. Hypertension in stage 4 chronic kidney disease due to type 2 diabetes mellitus ([place]) Uncontrolled Coreg added to regimen Continue lisinopril and amlodipine - carvedilol (COREG) 3.125 MG tablet; Take 1 tablet (3.125 mg total) by mouth 2 (two) times daily with a meal.  Dispense: 60 tablet; Refill: 6   Health Care Maintenance: States she is not ready for a Pap smear and mammogram No orders of the defined types were placed in this encounter.   Return in about 1 month (around 02/24/2020) for Hypertension.       [name], MD, FAAFP. [facility] [place], [place]  01/24/2020, 2:13 PM

## 2020-01-24 NOTE — Patient Instructions (Addendum)
Sent referral to [facility]  Ph. # [phone] Address [address] [place] [address]

## 2020-01-24 NOTE — Progress Notes (Signed)
Having vaginal dryness.

## 2020-01-25 LAB — BASIC METABOLIC PANEL
BUN/Creatinine Ratio: 13 (ref 9–23)
BUN: 27 mg/dL — ABNORMAL HIGH (ref 6–24)
CO2: 22 mmol/L (ref 20–29)
Calcium: 9.1 mg/dL (ref 8.7–10.2)
Chloride: 107 mmol/L — ABNORMAL HIGH (ref 96–106)
Creatinine, Ser: 2.03 mg/dL — ABNORMAL HIGH (ref 0.57–1.00)
GFR calc Af Amer: 30 mL/min/{1.73_m2} — ABNORMAL LOW (ref >59)
GFR calc non Af Amer: 26 mL/min/{1.73_m2} — ABNORMAL LOW (ref >59)
Glucose: 133 mg/dL — ABNORMAL HIGH (ref 65–99)
Potassium: 4.8 mmol/L (ref 3.5–5.2)
Sodium: 142 mmol/L (ref 134–144)

## 2020-01-25 NOTE — Progress Notes (Unsigned)
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 Presents to the clinic for hypertension evaluation, counseling, and management. Patient was referred on 10/21/19 by Freeman Caldron and last seen by Primary Care Provider Dr. Margarita Rana on 01/24/20.  Last seen by clinical pharmacist on 01/12/20 at which BP was elevated at 160/70 and pt requested to focus on diet before starting a new antihypertensive agent. No medication changes were made at that visit. Additionally, recently seen by PCP on 01/24/20 at which BP was elevated at 181/62 and carvedilol 3.125 mg BID was added.    Today, patient reports ***  Reschedule appt - pt seen by Dr. Margarita Rana two days ago  Compliance? Took meds this morning? When do you take your meds? Dizziness, headaches, blurred vision? History of swelling? Check Clinic BP? Home BP logs? If no logs, bring to next visit w/ BP cuff Go over BP goals Additional BP therapy if needed . Titrate coreg Diet??  Exercise??   Medication adherence *** .  Current BP Medications include:  carvedilol 3.125 mg BID, amlodipine5mg  daily, lisinopril 40 mg daily  Antihypertensives tried in the past include: amlodipine 10 mg (bilateral LE swelling), lisinopril at lower doses  Dietary habits include:has decreased amount of processed foods; does not drink caffeine  Exercise habits include:none  Family / Social history: - Fhx: DM, heart disease, kidney disease, stroke, HLD - Tobacco: former smoker (quit in 2015) - Alcohol:occasionally - Social: on verge of homelessness, but working with someone to set her up in a shelter in [place].   O:   Home BP readings: ***  Last 3 Office BP readings: BP Readings from Last 3 Encounters:  01/24/20 (!) 181/62  01/12/20 (!) 160/70  12/29/19 (!) 183/90    BMET    Component Value Date/Time   NA 142 01/24/2020 1441   K 4.8 01/24/2020 1441   CL 107 (H)  01/24/2020 1441   CO2 22 01/24/2020 1441   GLUCOSE 133 (H) 01/24/2020 1441   GLUCOSE 62 (L) 07/29/2019 1508   BUN 27 (H) 01/24/2020 1441   CREATININE 2.03 (H) 01/24/2020 1441   CREATININE 0.65 04/09/2016 1150   CALCIUM 9.1 01/24/2020 1441   GFRNONAA 26 (L) 01/24/2020 1441   GFRNONAA >89 04/09/2016 1150   GFRAA 30 (L) 01/24/2020 1441   GFRAA >89 04/09/2016 1150    Renal function: Estimated Creatinine Clearance: 27 mL/min (A) (by C-G formula based on SCr of 2.03 mg/dL (H)).  Clinical ASCVD: {YES/NO:21197} The 10-year ASCVD risk score Mikey Bussing DC Jr., et al., 2013) is: 39.2%   Values used to calculate the score:     Age: 59 years     Sex: Female     Is Non-Hispanic African American: Yes     Diabetic: Yes     Tobacco smoker: No     Systolic Blood Pressure: 081 mmHg     Is BP treated: Yes     HDL Cholesterol: 61 mg/dL     Total Cholesterol: 235 mg/dL  PHQ-2 Score: ***   A/P: Hypertension diagnosed *** currently *** on current medications. BP Goal = < *** mmHg. Medication adherence ***.  -{Meds adjust:18428} ***.  -F/u labs ordered - *** -Counseled on lifestyle modifications for blood pressure control including reduced dietary sodium, increased exercise, adequate sleep.  Results reviewed and written information provided.   Total time in face-to-face counseling *** minutes.   F/U Clinic Visit in ***.  Lorel Monaco, PharmD, [place] PGY2 Ambulatory Care Resident [place]

## 2020-01-26 ENCOUNTER — Ambulatory Visit: Payer: Medicaid Other | Admitting: Pharmacist

## 2020-01-27 ENCOUNTER — Telehealth: Payer: Self-pay

## 2020-01-27 NOTE — Telephone Encounter (Signed)
Pt was called and a VM was left informing patient to return phone call.

## 2020-01-27 NOTE — Telephone Encounter (Signed)
-----   Message from [name], MD sent at 01/25/2020  8:14 AM EST ----- Kidney function is still abnormal.  I would need her to give nephrology a call to set up appointment.

## 2020-01-28 ENCOUNTER — Telehealth: Payer: Self-pay

## 2020-01-28 NOTE — Telephone Encounter (Signed)
-----   Message from [name], MD sent at 01/25/2020  8:14 AM EST ----- Kidney function is still abnormal.  I would need her to give nephrology a call to set up appointment.

## 2020-01-28 NOTE — Telephone Encounter (Signed)
Pt was called and there was no VM to pick up. Pt will be mailed a letter.

## 2020-02-28 ENCOUNTER — Ambulatory Visit: Payer: Medicaid Other | Attending: Family Medicine | Admitting: Family Medicine

## 2020-02-28 ENCOUNTER — Telehealth: Payer: Self-pay | Admitting: Family Medicine

## 2020-02-28 ENCOUNTER — Other Ambulatory Visit: Payer: Self-pay

## 2020-02-28 NOTE — Telephone Encounter (Signed)
Called patient on mobile and home phone. Unable to reach on mobile, LVM on home phone letting her know due to inclement weather her visit was changed to virtual and the nurse would be giving her a call at or around 2:10. Advised patient to call back with any questions or concerns [phone].

## 2020-03-19 ENCOUNTER — Emergency Department (HOSPITAL_COMMUNITY)
Admission: EM | Admit: 2020-03-19 | Discharge: 2020-03-20 | Disposition: A | Discharge status: Home / Self Care | Payer: Medicaid Other | Attending: Emergency Medicine | Admitting: Emergency Medicine

## 2020-03-19 ENCOUNTER — Other Ambulatory Visit: Payer: Self-pay

## 2020-03-19 ENCOUNTER — Encounter (HOSPITAL_COMMUNITY): Payer: Self-pay | Admitting: Emergency Medicine

## 2020-03-19 DIAGNOSIS — Z7984 Long term (current) use of oral hypoglycemic drugs: Secondary | ICD-10-CM | POA: Diagnosis not present

## 2020-03-19 DIAGNOSIS — R1084 Generalized abdominal pain: Secondary | ICD-10-CM | POA: Diagnosis present

## 2020-03-19 DIAGNOSIS — Z794 Long term (current) use of insulin: Secondary | ICD-10-CM | POA: Diagnosis not present

## 2020-03-19 DIAGNOSIS — R1110 Vomiting, unspecified: Secondary | ICD-10-CM | POA: Insufficient documentation

## 2020-03-19 DIAGNOSIS — Z79899 Other long term (current) drug therapy: Secondary | ICD-10-CM | POA: Insufficient documentation

## 2020-03-19 DIAGNOSIS — Z20822 Contact with and (suspected) exposure to covid-19: Secondary | ICD-10-CM | POA: Diagnosis not present

## 2020-03-19 DIAGNOSIS — Z87891 Personal history of nicotine dependence: Secondary | ICD-10-CM | POA: Insufficient documentation

## 2020-03-19 DIAGNOSIS — I129 Hypertensive chronic kidney disease with stage 1 through stage 4 chronic kidney disease, or unspecified chronic kidney disease: Secondary | ICD-10-CM | POA: Diagnosis not present

## 2020-03-19 DIAGNOSIS — E1122 Type 2 diabetes mellitus with diabetic chronic kidney disease: Secondary | ICD-10-CM | POA: Diagnosis not present

## 2020-03-19 DIAGNOSIS — N184 Chronic kidney disease, stage 4 (severe): Secondary | ICD-10-CM

## 2020-03-19 DIAGNOSIS — N3090 Cystitis, unspecified without hematuria: Secondary | ICD-10-CM | POA: Diagnosis not present

## 2020-03-19 LAB — COMPREHENSIVE METABOLIC PANEL
ALT: 14 U/L (ref 0–44)
AST: 16 U/L (ref 15–41)
Albumin: 3.4 g/dL — ABNORMAL LOW (ref 3.5–5.0)
Alkaline Phosphatase: 65 U/L (ref 38–126)
Anion gap: 8 (ref 5–15)
BUN: 28 mg/dL — ABNORMAL HIGH (ref 6–20)
CO2: 25 mmol/L (ref 22–32)
Calcium: 9.1 mg/dL (ref 8.9–10.3)
Chloride: 103 mmol/L (ref 98–111)
Creatinine, Ser: 2.6 mg/dL — ABNORMAL HIGH (ref 0.44–1.00)
GFR, Estimated: 21 mL/min — ABNORMAL LOW (ref >60)
Glucose, Bld: 196 mg/dL — ABNORMAL HIGH (ref 70–99)
Potassium: 4.2 mmol/L (ref 3.5–5.1)
Sodium: 136 mmol/L (ref 135–145)
Total Bilirubin: 0.9 mg/dL (ref 0.3–1.2)
Total Protein: 7.4 g/dL (ref 6.5–8.1)

## 2020-03-19 LAB — CBC
HCT: 33.5 % — ABNORMAL LOW (ref 36.0–46.0)
Hemoglobin: 10.9 g/dL — ABNORMAL LOW (ref 12.0–15.0)
MCH: 29.2 pg (ref 26.0–34.0)
MCHC: 32.5 g/dL (ref 30.0–36.0)
MCV: 89.8 fL (ref 80.0–100.0)
Platelets: 365 10*3/uL (ref 150–400)
RBC: 3.73 MIL/uL — ABNORMAL LOW (ref 3.87–5.11)
RDW: 12.4 % (ref 11.5–15.5)
WBC: 7.5 10*3/uL (ref 4.0–10.5)
nRBC: 0 % (ref 0.0–0.2)

## 2020-03-19 LAB — SARS CORONAVIRUS 2 BY RT PCR (HOSPITAL ORDER, PERFORMED IN ~~LOC~~ HOSPITAL LAB): SARS Coronavirus 2: NEGATIVE

## 2020-03-19 LAB — CBG MONITORING, ED: Glucose-Capillary: 182 mg/dL — ABNORMAL HIGH (ref 70–99)

## 2020-03-19 LAB — LIPASE, BLOOD: Lipase: 21 U/L (ref 11–51)

## 2020-03-19 NOTE — ED Notes (Signed)
Patient given disposable underwear and scrub pants and supplies to clean up. incont of urine x1

## 2020-03-19 NOTE — ED Triage Notes (Signed)
Pt to triage via [facility] from home.  Reports generalized abd pain, nausea, and vomiting since eating a Hot Pocket 2 days ago.  EMS reports pt had a fever.  They administered Tylenol '1000mg'$  however pt vomited afterwards. Pt states it didn't taste right and she had apple juice since then that tasted bad.

## 2020-03-20 LAB — URINALYSIS, ROUTINE W REFLEX MICROSCOPIC
Bilirubin Urine: NEGATIVE
Glucose, UA: NEGATIVE mg/dL
Hgb urine dipstick: NEGATIVE
Ketones, ur: NEGATIVE mg/dL
Nitrite: NEGATIVE
Protein, ur: >=300 mg/dL — AB
Specific Gravity, Urine: 1.016 (ref 1.005–1.030)
WBC, UA: >50 WBC/hpf — ABNORMAL HIGH (ref 0–5)
pH: 7 (ref 5.0–8.0)

## 2020-03-20 LAB — CBG MONITORING, ED: Glucose-Capillary: 138 mg/dL — ABNORMAL HIGH (ref 70–99)

## 2020-03-20 MED ORDER — CEPHALEXIN 500 MG PO CAPS: 500.0000 mg | ORAL_CAPSULE | Freq: Two times a day (BID) | ORAL | 0 refills | Status: DC

## 2020-03-20 MED ORDER — ONDANSETRON 4 MG PO TBDP: 4.0000 mg | ORAL_TABLET | Freq: Three times a day (TID) | ORAL | 0 refills | Status: DC | PRN

## 2020-03-20 NOTE — ED Notes (Signed)
Pt agitated said if she doesn't get breakfast she will then go to jail. Offered water and crackers she said she want grits and bacon and eggs. Said she can more than happy go downstairs and get herself breakfast. However we will not provide that for her since she is here for abdominal pain

## 2020-03-20 NOTE — ED Notes (Signed)
Pt refused vitals 

## 2020-03-20 NOTE — Discharge Instructions (Addendum)
Your kidney function was slightly worse today due to dehydration.  Drink plenty of fluids.  Avoid ibuprofen and aleve.  Get rechecked immediately if you develop severe pain, cannot pee or have new concerns.  Please see your doctor in the next 3-5 days to have your kidney function rechecked.

## 2020-03-20 NOTE — ED Provider Notes (Signed)
[facility]PART[facility] Provider Note   CSN: [id number] Arrival date & time: 03/19/20  1241     History Chief Complaint  Patient presents with  . Abdominal Pain    Lorraine Turner is a 60 y.o. female.  The history is provided by the patient and medical records.  Abdominal Pain  Lorraine Turner is a 60 y.o. female who presents to the Emergency Department complaining of abdominal pain. She presents the emergency department complaining of generalized abdominal pain that began after eating a hot pocket on Thursday. She states that it tasted very bad. The next day she developed numerous episodes of emesis with persistent abdominal pain. She tried to drink some apple juice, which cause constipation. She then presented to the emergency department for evaluation. Since ED presentation she has only had one episode of emesis and her abdominal pain has completely resolved. She is now able to eat and drink without difficulty. She denies any fevers, dysuria or recurrent abdominal pain. She does have a history of diabetes, hypertension, stage IV CKD.    Past Medical History:  Diagnosis Date  . Anxiety   . Bipolar 1 disorder ([place])   . Depression   . Diabetes mellitus without complication ([place])   . Gallstones   . Hyperlipidemia   . Hypertension   . Neuropathy   . Schizophrenia ([place])   . Seizures ([place])    X1- febrile seizure as a child-none since    Patient Active Problem List   Diagnosis Date Noted  . DKA (diabetic ketoacidoses) 09/12/2018  . Suicidal ideation 09/12/2018  . AKI (acute kidney injury) ([place]) 09/12/2018  . Adjustment disorder with mixed disturbance of emotions and conduct   . MDD (major depressive disorder), severe ([place]) 03/03/2018  . Umbilical hernia with obstruction 11/24/2015  . Symptomatic cholelithiasis 11/24/2015  . Type 2 diabetes mellitus with hyperglycemia ([place])   . Schizoaffective disorder, depressive type ([place]) 08/14/2011  .  UNSPECIFIED ANEMIA 01/23/2009  . Essential hypertension 10/12/2008  . HYPERCHOLESTEROLEMIA 01/01/2007  . PANIC DISORDER 01/01/2007  . Obsessive compulsive disorder 01/01/2007    Past Surgical History:  Procedure Laterality Date  . CESAREAN SECTION  05/1998   X 1  . CHOLECYSTECTOMY N/A 03/27/2016   Procedure: LAPAROSCOPIC CHOLECYSTECTOMY;  Surgeon: Olean Ree, MD;  Location: [facility] ORS;  Service: General;  Laterality: N/A;  . TONSILLECTOMY AND ADENOIDECTOMY     age 58  . TUBAL LIGATION       OB History   No obstetric history on file.     Family History  Problem Relation Age of Onset  . Diabetes Mother   . Heart disease Mother   . Kidney disease Mother   . Stroke Mother   . Hyperlipidemia Mother   . Diabetes Maternal Aunt     Social History   Tobacco Use  . Smoking status: Former Smoker    Types: Cigarettes    Quit date: 03/14/2013    Years since quitting: 7.0  . Smokeless tobacco: Never Used  . Tobacco comment: 1 cigarette1-2 times year, only when she gets stressed  Vaping Use  . Vaping Use: Never used  Substance Use Topics  . Alcohol use: Yes    Alcohol/week: 1.0 standard drink    Types: 1 Cans of beer per week    Comment: One can of beer every 2-3 months.   . Drug use: No    Home Medications Prior to Admission medications   Medication Sig Start Date End Date  Taking? Authorizing Provider  cephALEXin (KEFLEX) 500 MG capsule Take 1 capsule (500 mg total) by mouth 2 (two) times daily. 03/20/20  Yes Quintella Reichert, MD  ondansetron (ZOFRAN ODT) 4 MG disintegrating tablet Take 1 tablet (4 mg total) by mouth every 8 (eight) hours as needed for nausea or vomiting. 03/20/20  Yes Quintella Reichert, MD  amLODipine (NORVASC) 5 MG tablet Take 1 tablet (5 mg total) by mouth daily. 12/15/19   [name], MD  atorvastatin (LIPITOR) 40 MG tablet Take 1 tablet (40 mg total) by mouth daily. 10/21/19   Argentina Donovan, PA-C  bacitracin ointment Apply 1 application topically 2 (two)  times daily. Patient not taking: Reported on 01/24/2020 04/21/19   Montine Circle, PA-C  Blood Glucose Monitoring Suppl (TRUE METRIX METER) w/Device KIT 1 Device by Does not apply route 3 (three) times daily. For blood sugar checks 09/13/18   Barb Merino, MD  carvedilol (COREG) 3.125 MG tablet Take 1 tablet (3.125 mg total) by mouth 2 (two) times daily with a meal. 01/24/20   [name], MD  gabapentin (NEURONTIN) 100 MG capsule Take 1 capsule (100 mg total) by mouth 3 (three) times daily. 10/21/19 01/19/20  Argentina Donovan, PA-C  glipiZIDE (GLUCOTROL) 10 MG tablet Take 1 tablet (10 mg total) by mouth 2 (two) times daily before a meal. 10/21/19 11/20/19  Argentina Donovan, PA-C  glucose blood test strip Use as instructed 10/28/18   Argentina Donovan, PA-C  insulin glargine (LANTUS) 100 UNIT/ML injection Inject 0.15 mLs (15 Units total) into the skin at bedtime. 10/21/19   Argentina Donovan, PA-C  Insulin Syringe-Needle U-100 (SAFETY INSULIN SYRINGES) 30G X 5/16" 0.5 ML MISC 1 each by Does not apply route at bedtime. 10/21/19   Argentina Donovan, PA-C  lisinopril (ZESTRIL) 40 MG tablet Take 1 tablet (40 mg total) by mouth daily. For high blood pressure 12/15/19   [name], MD  loperamide (IMODIUM) 2 MG capsule Take 1 capsule (2 mg total) by mouth 4 (four) times daily as needed for diarrhea or loose stools. 10/16/18   Sherwood Gambler, MD  QUEtiapine (SEROQUEL) 100 MG tablet Take 1 tablet (100 mg total) by mouth at bedtime. For mood control Patient not taking: Reported on 01/24/2020 10/21/19   Argentina Donovan, PA-C  TRUEplus Lancets 28G MISC 1 each by Does not apply route 3 (three) times daily. For blood sugar checks 09/13/18   Barb Merino, MD  venlafaxine XR (EFFEXOR XR) 75 MG 24 hr capsule Take 1 capsule (75 mg total) by mouth daily with breakfast. For depression Patient not taking: Reported on 01/24/2020 10/21/19   Argentina Donovan, PA-C    Allergies    Patient has no known allergies.  Review  of Systems   Review of Systems  Gastrointestinal: Positive for abdominal pain.  All other systems reviewed and are negative.   Physical Exam Updated Vital Signs BP (!) 171/63 (BP Location: Right Arm)   Pulse 69   Temp 98.3 F (36.8 C) (Oral)   Resp 18   SpO2 98%   Physical Exam Vitals and nursing note reviewed.  Constitutional:      Appearance: She is well-developed and well-nourished.  HENT:     Head: Normocephalic and atraumatic.  Cardiovascular:     Rate and Rhythm: Normal rate and regular rhythm.     Heart sounds: No murmur heard.   Pulmonary:     Effort: Pulmonary effort is normal. No respiratory distress.  Breath sounds: Normal breath sounds.  Abdominal:     Palpations: Abdomen is soft.     Tenderness: There is no abdominal tenderness. There is no guarding or rebound.  Musculoskeletal:        General: No swelling, tenderness or edema.  Skin:    General: Skin is warm and dry.  Neurological:     Mental Status: She is alert and oriented to person, place, and time.  Psychiatric:        Mood and Affect: Mood and affect normal.        Behavior: Behavior normal.     ED Results / Procedures / Treatments   Labs (all labs ordered are listed, but only abnormal results are displayed) Labs Reviewed  COMPREHENSIVE METABOLIC PANEL - Abnormal; Notable for the following components:      Result Value   Glucose, Bld 196 (*)    BUN 28 (*)    Creatinine, Ser 2.60 (*)    Albumin 3.4 (*)    GFR, Estimated 21 (*)    All other components within normal limits  CBC - Abnormal; Notable for the following components:   RBC 3.73 (*)    Hemoglobin 10.9 (*)    HCT 33.5 (*)    All other components within normal limits  URINALYSIS, ROUTINE W REFLEX MICROSCOPIC - Abnormal; Notable for the following components:   Color, Urine AMBER (*)    APPearance CLOUDY (*)    Protein, ur >=300 (*)    Leukocytes,Ua LARGE (*)    WBC, UA >50 (*)    Bacteria, UA MANY (*)    All other  components within normal limits  CBG MONITORING, ED - Abnormal; Notable for the following components:   Glucose-Capillary 182 (*)    All other components within normal limits  CBG MONITORING, ED - Abnormal; Notable for the following components:   Glucose-Capillary 138 (*)    All other components within normal limits  SARS CORONAVIRUS 2 BY RT PCR (HOSPITAL ORDER, [facility])  LIPASE, BLOOD    EKG None  Radiology No results found.  Procedures Procedures   Medications Ordered in ED Medications - No data to display  ED Course  I have reviewed the triage vital signs and the nursing notes.  Pertinent labs & imaging results that were available during my care of the patient were reviewed by me and considered in my medical decision making (see chart for details).    MDM Rules/Calculators/A&P                         patient with kidney disease, diabetes, hypertension here for evaluation of abdominal pain and vomiting after eating food that did not taste right. Currently she is completely asymptomatic on ED evaluation. Labs obtained yesterday demonstrate gradual progression of her renal disease, which has worsened over the last several months. UA is equivocal for UTI with numerous squamous cells present as well as WBC and bacteria. She does not have any active dysuria or concerning symptoms for pyelonephritis. Given equivocal UTI will treat for cystitis. She is hydrating well without difficulty in the department. Plan to discharge home on short course of antibiotics, antiemetics PRN with close PCP follow-up for recheck of her BMP. Current presentation is not consistent with acute abdomen, bowel obstruction, serious bacterial infection. Patient is in agreement with treatment plan.  Final Clinical Impression(s) / ED Diagnoses Final diagnoses:  Generalized abdominal pain  Stage 4 chronic kidney  disease ([place])  Cystitis    Rx / DC Orders ED Discharge Orders          Ordered    ondansetron (ZOFRAN ODT) 4 MG disintegrating tablet  Every 8 hours PRN        03/20/20 1636    cephALEXin (KEFLEX) 500 MG capsule  2 times daily        03/20/20 1636           Quintella Reichert, MD 03/20/20 1645

## 2020-03-20 NOTE — ED Notes (Signed)
Patient verbalizes understanding of discharge instructions. Prescriptions reviewed. Opportunity for questioning and answers were provided. Armband removed by staff, pt discharged from ED ambulatory.

## 2020-03-20 NOTE — ED Notes (Signed)
Pt refused VS  

## 2020-03-21 ENCOUNTER — Telehealth: Payer: Self-pay | Admitting: *Deleted

## 2020-03-21 NOTE — Telephone Encounter (Signed)
Error

## 2020-06-23 ENCOUNTER — Encounter (HOSPITAL_COMMUNITY): Payer: Self-pay | Admitting: Emergency Medicine

## 2020-09-01 ENCOUNTER — Inpatient Hospital Stay (HOSPITAL_COMMUNITY)
Admission: EM | Admit: 2020-09-01 | Discharge: 2020-09-05 | DRG: 305 | Disposition: A | Discharge status: Home / Self Care | Payer: Medicaid Other | Attending: Family Medicine | Admitting: Family Medicine

## 2020-09-01 ENCOUNTER — Other Ambulatory Visit: Payer: Self-pay

## 2020-09-01 ENCOUNTER — Encounter (HOSPITAL_COMMUNITY): Payer: Self-pay

## 2020-09-01 ENCOUNTER — Observation Stay (HOSPITAL_COMMUNITY): Payer: Medicaid Other

## 2020-09-01 DIAGNOSIS — N179 Acute kidney failure, unspecified: Secondary | ICD-10-CM | POA: Diagnosis present

## 2020-09-01 DIAGNOSIS — I10 Essential (primary) hypertension: Secondary | ICD-10-CM | POA: Diagnosis not present

## 2020-09-01 DIAGNOSIS — Z841 Family history of disorders of kidney and ureter: Secondary | ICD-10-CM

## 2020-09-01 DIAGNOSIS — N183 Chronic kidney disease, stage 3 unspecified: Secondary | ICD-10-CM

## 2020-09-01 DIAGNOSIS — Z79899 Other long term (current) drug therapy: Secondary | ICD-10-CM

## 2020-09-01 DIAGNOSIS — F419 Anxiety disorder, unspecified: Secondary | ICD-10-CM | POA: Diagnosis present

## 2020-09-01 DIAGNOSIS — R45851 Suicidal ideations: Secondary | ICD-10-CM | POA: Diagnosis present

## 2020-09-01 DIAGNOSIS — Z20822 Contact with and (suspected) exposure to covid-19: Secondary | ICD-10-CM | POA: Diagnosis present

## 2020-09-01 DIAGNOSIS — T1491XA Suicide attempt, initial encounter: Secondary | ICD-10-CM

## 2020-09-01 DIAGNOSIS — E785 Hyperlipidemia, unspecified: Secondary | ICD-10-CM | POA: Diagnosis present

## 2020-09-01 DIAGNOSIS — Z833 Family history of diabetes mellitus: Secondary | ICD-10-CM

## 2020-09-01 DIAGNOSIS — F4325 Adjustment disorder with mixed disturbance of emotions and conduct: Secondary | ICD-10-CM

## 2020-09-01 DIAGNOSIS — N184 Chronic kidney disease, stage 4 (severe): Secondary | ICD-10-CM | POA: Diagnosis not present

## 2020-09-01 DIAGNOSIS — Z5900 Homelessness unspecified: Secondary | ICD-10-CM

## 2020-09-01 DIAGNOSIS — I160 Hypertensive urgency: Principal | ICD-10-CM | POA: Diagnosis present

## 2020-09-01 DIAGNOSIS — Z823 Family history of stroke: Secondary | ICD-10-CM

## 2020-09-01 DIAGNOSIS — Z9114 Patient's other noncompliance with medication regimen: Secondary | ICD-10-CM

## 2020-09-01 DIAGNOSIS — F32A Depression, unspecified: Secondary | ICD-10-CM | POA: Diagnosis present

## 2020-09-01 DIAGNOSIS — Z8249 Family history of ischemic heart disease and other diseases of the circulatory system: Secondary | ICD-10-CM

## 2020-09-01 DIAGNOSIS — Z794 Long term (current) use of insulin: Secondary | ICD-10-CM

## 2020-09-01 DIAGNOSIS — E1142 Type 2 diabetes mellitus with diabetic polyneuropathy: Secondary | ICD-10-CM | POA: Diagnosis present

## 2020-09-01 DIAGNOSIS — N390 Urinary tract infection, site not specified: Secondary | ICD-10-CM | POA: Diagnosis present

## 2020-09-01 DIAGNOSIS — I129 Hypertensive chronic kidney disease with stage 1 through stage 4 chronic kidney disease, or unspecified chronic kidney disease: Secondary | ICD-10-CM | POA: Diagnosis present

## 2020-09-01 DIAGNOSIS — F251 Schizoaffective disorder, depressive type: Secondary | ICD-10-CM | POA: Diagnosis present

## 2020-09-01 DIAGNOSIS — E1122 Type 2 diabetes mellitus with diabetic chronic kidney disease: Secondary | ICD-10-CM | POA: Diagnosis present

## 2020-09-01 DIAGNOSIS — E1165 Type 2 diabetes mellitus with hyperglycemia: Secondary | ICD-10-CM | POA: Diagnosis present

## 2020-09-01 DIAGNOSIS — N189 Chronic kidney disease, unspecified: Secondary | ICD-10-CM

## 2020-09-01 LAB — CBC WITH DIFFERENTIAL/PLATELET
Abs Immature Granulocytes: 0.02 10*3/uL (ref 0.00–0.07)
Basophils Absolute: 0.1 10*3/uL (ref 0.0–0.1)
Basophils Relative: 1 %
Eosinophils Absolute: 0.1 10*3/uL (ref 0.0–0.5)
Eosinophils Relative: 1 %
HCT: 31.7 % — ABNORMAL LOW (ref 36.0–46.0)
Hemoglobin: 10.3 g/dL — ABNORMAL LOW (ref 12.0–15.0)
Immature Granulocytes: 0 %
Lymphocytes Relative: 13 %
Lymphs Abs: 0.9 10*3/uL (ref 0.7–4.0)
MCH: 29.6 pg (ref 26.0–34.0)
MCHC: 32.5 g/dL (ref 30.0–36.0)
MCV: 91.1 fL (ref 80.0–100.0)
Monocytes Absolute: 0.7 10*3/uL (ref 0.1–1.0)
Monocytes Relative: 10 %
Neutro Abs: 5.1 10*3/uL (ref 1.7–7.7)
Neutrophils Relative %: 75 %
Platelets: 262 10*3/uL (ref 150–400)
RBC: 3.48 MIL/uL — ABNORMAL LOW (ref 3.87–5.11)
RDW: 13.2 % (ref 11.5–15.5)
WBC: 6.9 10*3/uL (ref 4.0–10.5)
nRBC: 0 % (ref 0.0–0.2)

## 2020-09-01 LAB — URINALYSIS, ROUTINE W REFLEX MICROSCOPIC
Bilirubin Urine: NEGATIVE
Glucose, UA: NEGATIVE mg/dL
Hgb urine dipstick: NEGATIVE
Ketones, ur: NEGATIVE mg/dL
Nitrite: NEGATIVE
Protein, ur: >=300 mg/dL — AB
Specific Gravity, Urine: 1.013 (ref 1.005–1.030)
WBC, UA: >50 WBC/hpf — ABNORMAL HIGH (ref 0–5)
pH: 6 (ref 5.0–8.0)

## 2020-09-01 LAB — RAPID URINE DRUG SCREEN, HOSP PERFORMED
Amphetamines: NOT DETECTED
Barbiturates: NOT DETECTED
Benzodiazepines: NOT DETECTED
Cocaine: NOT DETECTED
Opiates: NOT DETECTED
Tetrahydrocannabinol: NOT DETECTED

## 2020-09-01 LAB — COMPREHENSIVE METABOLIC PANEL
ALT: 14 U/L (ref 0–44)
AST: 15 U/L (ref 15–41)
Albumin: 4 g/dL (ref 3.5–5.0)
Alkaline Phosphatase: 78 U/L (ref 38–126)
Anion gap: 7 (ref 5–15)
BUN: 33 mg/dL — ABNORMAL HIGH (ref 6–20)
CO2: 24 mmol/L (ref 22–32)
Calcium: 9.5 mg/dL (ref 8.9–10.3)
Chloride: 108 mmol/L (ref 98–111)
Creatinine, Ser: 3.6 mg/dL — ABNORMAL HIGH (ref 0.44–1.00)
GFR, Estimated: 14 mL/min — ABNORMAL LOW (ref >60)
Glucose, Bld: 191 mg/dL — ABNORMAL HIGH (ref 70–99)
Potassium: 4.5 mmol/L (ref 3.5–5.1)
Sodium: 139 mmol/L (ref 135–145)
Total Bilirubin: 0.7 mg/dL (ref 0.3–1.2)
Total Protein: 7.7 g/dL (ref 6.5–8.1)

## 2020-09-01 LAB — CBG MONITORING, ED: Glucose-Capillary: 187 mg/dL — ABNORMAL HIGH (ref 70–99)

## 2020-09-01 LAB — RESP PANEL BY RT-PCR (FLU A&B, COVID) ARPGX2
Influenza A by PCR: NEGATIVE
Influenza B by PCR: NEGATIVE
SARS Coronavirus 2 by RT PCR: NEGATIVE

## 2020-09-01 LAB — HCG, QUANTITATIVE, PREGNANCY: hCG, Beta Chain, Quant, S: 3 m[IU]/mL (ref <5)

## 2020-09-01 LAB — ETHANOL: Alcohol, Ethyl (B): <10 mg/dL (ref <10)

## 2020-09-01 MED ORDER — HYDRALAZINE HCL 25 MG PO TABS
100.0000 mg | ORAL_TABLET | ORAL | Status: AC
  Administered 2020-09-01: 100 mg via ORAL
  Filled 2020-09-01: qty 4

## 2020-09-01 MED ORDER — ACETAMINOPHEN 650 MG RE SUPP: 650.0000 mg | Freq: Four times a day (QID) | RECTAL | Status: DC | PRN

## 2020-09-01 MED ORDER — INSULIN ASPART 100 UNIT/ML IJ SOLN
0.0000 [IU] | Freq: Three times a day (TID) | INTRAMUSCULAR | Status: DC
  Administered 2020-09-02 – 2020-09-03 (×6): 2 [IU] via SUBCUTANEOUS
  Administered 2020-09-04 – 2020-09-05 (×4): 1 [IU] via SUBCUTANEOUS
  Filled 2020-09-01: qty 0.09

## 2020-09-01 MED ORDER — ONDANSETRON HCL 4 MG PO TABS: 4.0000 mg | ORAL_TABLET | Freq: Four times a day (QID) | ORAL | Status: DC | PRN

## 2020-09-01 MED ORDER — CARVEDILOL 3.125 MG PO TABS
3.1250 mg | ORAL_TABLET | Freq: Two times a day (BID) | ORAL | Status: DC
  Administered 2020-09-02 – 2020-09-05 (×7): 3.125 mg via ORAL
  Filled 2020-09-01 (×7): qty 1

## 2020-09-01 MED ORDER — METOPROLOL TARTRATE 5 MG/5ML IV SOLN: 5.0000 mg | Freq: Four times a day (QID) | INTRAVENOUS | Status: DC | PRN

## 2020-09-01 MED ORDER — AMLODIPINE BESYLATE 5 MG PO TABS
5.0000 mg | ORAL_TABLET | Freq: Every day | ORAL | Status: DC
  Administered 2020-09-01 – 2020-09-03 (×3): 5 mg via ORAL
  Filled 2020-09-01 (×3): qty 1

## 2020-09-01 MED ORDER — ACETAMINOPHEN 325 MG PO TABS: 650.0000 mg | ORAL_TABLET | Freq: Four times a day (QID) | ORAL | Status: DC | PRN

## 2020-09-01 MED ORDER — LACTATED RINGERS IV SOLN: INTRAVENOUS | Status: DC

## 2020-09-01 MED ORDER — ONDANSETRON HCL 4 MG/2ML IJ SOLN
4.0000 mg | Freq: Four times a day (QID) | INTRAMUSCULAR | Status: DC | PRN
  Filled 2020-09-01: qty 2

## 2020-09-01 MED ORDER — ALBUTEROL SULFATE (2.5 MG/3ML) 0.083% IN NEBU
2.5000 mg | INHALATION_SOLUTION | Freq: Four times a day (QID) | RESPIRATORY_TRACT | Status: DC | PRN
Start: 2020-09-01 — End: 2020-09-05

## 2020-09-01 NOTE — H&P (Signed)
History and Physical  Patient Name: Lorraine Turner     QZR:[id number]    DOB: [birth year]    DOA: 09/01/2020 PCP: [name], MD  [place]  Chief Complaint: Suicidal ideation, depression    HPI: Lorraine Turner is a 60 y.o. female, with PMH of hypertension, depression, type 2 diabetes, dyslipidemia, schizoaffective disorder, CKD who presented to the ER on 09/01/2020 with suicidal ideation.  Patient was initially found by police, standing in the middle of traffic, trying to get hit by a car.  Patient has been depressed for the past few months, after becoming homeless.  She has been off of her regular medication due to inability to pay.  She has a history of psychiatric illness, previously on psych meds, but again has been off her meds for few months.  She has a history of suicide ideation in the past.  It appears she was evaluated by psych inpatient in August 2022.  She admits to poor p.o. intake, and feeling dehydrated.  She has been panhandling for money.  Based on previous records, history of poorly controlled hypertension in the past.  She denies alcohol, tobacco, illicit substance use.    ED course: -Vitals on admission: Afebrile, heart rate 62, respiratory rate 18, blood pressure 223/87 -Labs on initial presentation: Sodium 139, potassium 4.5, chloride 108, bicarb 24, glucose 191, BUN 33, creatinine 3.6, hemoglobin 10.3, WBC 6.9, COVID-negative, urine tox screen negative -Imaging obtained on admission: none -In the ED the patient was given hydralazine, and the hospitalist service was contacted for further evaluation and management, prior to going to psych unit     ROS: A complete and thorough 12 point review of systems obtained, negative listed in HPI.     Past Medical History:  Diagnosis Date   Anxiety    Bipolar 1 disorder ([place])    Depression    Diabetes mellitus without complication ([place])    Gallstones    Hyperlipidemia    Hypertension     Neuropathy    Schizophrenia ([place])    Seizures ([place])    X1- febrile seizure as a child-none since    Past Surgical History:  Procedure Laterality Date   CESAREAN SECTION  05/1998   X 1   CHOLECYSTECTOMY N/A 03/27/2016   Procedure: LAPAROSCOPIC CHOLECYSTECTOMY;  Surgeon: Olean Ree, MD;  Location: [facility] ORS;  Service: General;  Laterality: N/A;   TONSILLECTOMY AND ADENOIDECTOMY     age 53   TUBAL LIGATION      Social History: Patient is homeless.  The patient walks without assistance.  Non smoker.  No Known Allergies  Family history: family history includes Diabetes in her maternal aunt and mother; Heart disease in her mother; Hyperlipidemia in her mother; Kidney disease in her mother; Stroke in her mother.  Prior to Admission medications   Medication Sig Start Date End Date Taking? Authorizing Provider  amLODipine (NORVASC) 5 MG tablet Take 1 tablet (5 mg total) by mouth daily. 12/15/19   [name], MD  atorvastatin (LIPITOR) 40 MG tablet Take 1 tablet (40 mg total) by mouth daily. 10/21/19   Argentina Donovan, PA-C  bacitracin ointment Apply 1 application topically 2 (two) times daily. Patient not taking: Reported on 01/24/2020 04/21/19   Montine Circle, PA-C  Blood Glucose Monitoring Suppl (TRUE METRIX METER) w/Device KIT 1 Device by Does not apply route 3 (three) times daily. For blood sugar checks 09/13/18   Barb Merino, MD  carvedilol (COREG) 3.125 MG tablet  Take 1 tablet (3.125 mg total) by mouth 2 (two) times daily with a meal. 01/24/20   [name], MD  cephALEXin (KEFLEX) 500 MG capsule Take 1 capsule (500 mg total) by mouth 2 (two) times daily. 03/20/20   Quintella Reichert, MD  gabapentin (NEURONTIN) 100 MG capsule Take 1 capsule (100 mg total) by mouth 3 (three) times daily. 10/21/19 01/19/20  Argentina Donovan, PA-C  glipiZIDE (GLUCOTROL) 10 MG tablet Take 1 tablet (10 mg total) by mouth 2 (two) times daily before a meal. 10/21/19 11/20/19  Argentina Donovan, PA-C  glucose  blood test strip Use as instructed 10/28/18   Argentina Donovan, PA-C  insulin glargine (LANTUS) 100 UNIT/ML injection Inject 0.15 mLs (15 Units total) into the skin at bedtime. 10/21/19   Argentina Donovan, PA-C  Insulin Syringe-Needle U-100 (SAFETY INSULIN SYRINGES) 30G X 5/16" 0.5 ML MISC 1 each by Does not apply route at bedtime. 10/21/19   Argentina Donovan, PA-C  lisinopril (ZESTRIL) 40 MG tablet Take 1 tablet (40 mg total) by mouth daily. For high blood pressure 12/15/19   [name], MD  loperamide (IMODIUM) 2 MG capsule Take 1 capsule (2 mg total) by mouth 4 (four) times daily as needed for diarrhea or loose stools. 10/16/18   Sherwood Gambler, MD  ondansetron (ZOFRAN ODT) 4 MG disintegrating tablet Take 1 tablet (4 mg total) by mouth every 8 (eight) hours as needed for nausea or vomiting. 03/20/20   Quintella Reichert, MD  QUEtiapine (SEROQUEL) 100 MG tablet Take 1 tablet (100 mg total) by mouth at bedtime. For mood control Patient not taking: Reported on 01/24/2020 10/21/19   Argentina Donovan, PA-C  TRUEplus Lancets 28G MISC 1 each by Does not apply route 3 (three) times daily. For blood sugar checks 09/13/18   Barb Merino, MD  venlafaxine XR (EFFEXOR XR) 75 MG 24 hr capsule Take 1 capsule (75 mg total) by mouth daily with breakfast. For depression Patient not taking: Reported on 01/24/2020 10/21/19   Argentina Donovan, PA-C       Physical Exam: BP (!) 223/87   Pulse 62   Temp 98.4 F (36.9 C)   Resp 18   SpO2 100%   General appearance: Well-developed, adult female, alert and in no acute distress .   Eyes: Anicteric, conjunctiva pink, lids and lashes normal. PERRL.    ENT: Poor dentition Neck: No neck masses.  Trachea midline.  No thyromegaly/tenderness. Lymph: No cervical or supraclavicular lymphadenopathy. Cardiac: RRR, nl S1-S2, no murmurs appreciated.  No LE edema.  Radial and pedal pulses 2+ and symmetric. Respiratory: Normal respiratory rate and rhythm.  CTAB without rales or  wheezes. Abdomen: Abdomen soft.  No tenderness with palpation. No ascites, distension, hepatosplenomegaly.   MSK: No deformities or effusions of the large joints of the upper or lower extremities bilaterally.  No cyanosis or clubbing. Neuro: Cranial nerves 2 through 12 grossly intact.  Sensation intact to light touch. Speech is fluent.  Marland Kitchen    Psych: Depressed, suicidal ideation    Labs on Admission:  I have personally reviewed following labs and imaging studies: CBC: Recent Labs  Lab 09/01/20 2050  WBC 6.9  NEUTROABS 5.1  HGB 10.3*  HCT 31.7*  MCV 91.1  PLT 664   Basic Metabolic Panel: Recent Labs  Lab 09/01/20 2050  NA 139  K 4.5  CL 108  CO2 24  GLUCOSE 191*  BUN 33*  CREATININE 3.60*  CALCIUM 9.5   GFR: CrCl  cannot be calculated (Unknown ideal weight.).  Liver Function Tests: Recent Labs  Lab 09/01/20 2050  AST 15  ALT 14  ALKPHOS 78  BILITOT 0.7  PROT 7.7  ALBUMIN 4.0   No results for input(s): LIPASE, AMYLASE in the last 168 hours. No results for input(s): AMMONIA in the last 168 hours. Coagulation Profile: No results for input(s): INR, PROTIME in the last 168 hours. Cardiac Enzymes: No results for input(s): CKTOTAL, CKMB, CKMBINDEX, TROPONINI in the last 168 hours. BNP (last 3 results) No results for input(s): PROBNP in the last 8760 hours. HbA1C: No results for input(s): HGBA1C in the last 72 hours. CBG: Recent Labs  Lab 09/01/20 2040  GLUCAP 187*   Lipid Profile: No results for input(s): CHOL, HDL, LDLCALC, TRIG, CHOLHDL, LDLDIRECT in the last 72 hours. Thyroid Function Tests: No results for input(s): TSH, T4TOTAL, FREET4, T3FREE, THYROIDAB in the last 72 hours. Anemia Panel: No results for input(s): VITAMINB12, FOLATE, FERRITIN, TIBC, IRON, RETICCTPCT in the last 72 hours.   Recent Results (from the past 240 hour(s))  Resp Panel by RT-PCR (Flu A&B, Covid) Nasopharyngeal Swab     Status: None   Collection Time: 09/01/20  7:04 PM    Specimen: Nasopharyngeal Swab; Nasopharyngeal(NP) swabs in vial transport medium  Result Value Ref Range Status   SARS Coronavirus 2 by RT PCR NEGATIVE NEGATIVE Final    Comment: (NOTE) SARS-CoV-2 target nucleic acids are NOT DETECTED.  The SARS-CoV-2 RNA is generally detectable in upper respiratory specimens during the acute phase of infection. The lowest concentration of SARS-CoV-2 viral copies this assay can detect is 138 copies/mL. A negative result does not preclude SARS-Cov-2 infection and should not be used as the sole basis for treatment or other patient management decisions. A negative result may occur with  improper specimen collection/handling, submission of specimen other than nasopharyngeal swab, presence of viral mutation(s) within the areas targeted by this assay, and inadequate number of viral copies(<138 copies/mL). A negative result must be combined with clinical observations, patient history, and epidemiological information. The expected result is Negative.  Fact Sheet for Patients:  EntrepreneurPulse.com.au  Fact Sheet for Healthcare Providers:  IncredibleEmployment.be  This test is no t yet approved or cleared by the [place] FDA and  has been authorized for detection and/or diagnosis of SARS-CoV-2 by FDA under an Emergency Use Authorization (EUA). This EUA will remain  in effect (meaning this test can be used) for the duration of the COVID-19 declaration under Section 564(b)(1) of the Act, 21 U.S.C.section 360bbb-3(b)(1), unless the authorization is terminated  or revoked sooner.       Influenza A by PCR NEGATIVE NEGATIVE Final   Influenza B by PCR NEGATIVE NEGATIVE Final    Comment: (NOTE) The Xpert Xpress SARS-CoV-2/FLU/RSV plus assay is intended as an aid in the diagnosis of influenza from Nasopharyngeal swab specimens and should not be used as a sole basis for treatment. Nasal washings and aspirates are  unacceptable for Xpert Xpress SARS-CoV-2/FLU/RSV testing.  Fact Sheet for Patients: EntrepreneurPulse.com.au  Fact Sheet for Healthcare Providers: IncredibleEmployment.be  This test is not yet approved or cleared by the [place] FDA and has been authorized for detection and/or diagnosis of SARS-CoV-2 by FDA under an Emergency Use Authorization (EUA). This EUA will remain in effect (meaning this test can be used) for the duration of the COVID-19 declaration under Section 564(b)(1) of the Act, 21 U.S.C. section 360bbb-3(b)(1), unless the authorization is terminated or revoked.  Performed at [facility], [place] [address] [place], [place] [address]            Radiological Exams on Admission: Personally reviewed imaging which shows: No results found.        Assessment/Plan   1.  Suicidal ideation -Patient presented after trying to commit suicide by getting hit by car - History of schizoaffective.  Recently homeless worsening depression - Suicide precautions - Will need evaluation by psych once medically cleared  2.  Hypertensive urgency -On admission, systolic blood pressure greater than 220 mmHg - Appears previously had poorly controlled hypertension, was on lisinopril, Norvasc, Coreg, but has not taken in several months - Start back on Norvasc 5 mg daily - Hold lisinopril given worsening AKI - Start back on Coreg - Monitor blood pressure closely  3.  Type 2 diabetes -Hemoglobin A1c ordered - Glucose checks, sliding scale for now - ADA diet  4.  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  Possibly related to diabetes and small vessel disease given poorly controlled hypertension - UPC ordered - Renal ultrasound ordered - Avoid renal toxic agents - IV fluids started - Follow-up labs ordered  5.  History of depression,  schizoaffective -Recommend psych consult in the morning     DVT prophylaxis: SCDs Code Status: Full Family Communication: None Disposition Plan: Anticipate discharge to psych versus homeless shelter Consults called: None Admission status: Observation   At the point of initial evaluation, it is my clinical opinion that admission for OBSERVATION is reasonable and necessary because the patient's presenting complaints in the context of their chronic conditions represent sufficient risk of deterioration or significant morbidity to constitute reasonable grounds for close observation in the hospital setting, but that the patient may be medically stable for discharge from the hospital within 24 to 48 hours.    Medical decision making: Patient seen at 10:22 PM on 09/01/2020.  The patient was discussed with ER provider.  What exists of the patient's chart was reviewed in depth and summarized above.  Clinical condition: Watcher given suicidal ideation      Doran Heater [facility] Please page though [place] or Epic secure chat:  For password, contact charge nurse

## 2020-09-01 NOTE — ED Provider Notes (Signed)
Also does appear to have worsening creatinine of [facility]-EMERGENCY DEPT Provider Note   CSN: [id number] Arrival date & time: 09/01/20  1813     History Chief Complaint  Patient presents with   Suicidal   IVC    Lorraine Turner is a 60 y.o. female with past medical history of schizophrenia, major depression disorder that presents to the emergency department today for attempted suicide attempt.  Patient was found by [name] standing in the middle of traffic, states that she was trying to commit suicide.  Patient states that she has been feeling suicidal for the past 2 months, admits to recently being homeless and states that she is not dealing with it well.  States that she has attempted getting hit by traffic in the past, however this time she was hearing voices telling her to stand and middle of traffic.  Patient has been off of her psychiatric medications for the past 2 months, states that she cannot afford them.  Patient also states that she has been off of her hypertensive and her diabetes medications including her amlodipine, atorvastatin, insulin, metformin, carvedilol.  Denies pain anywhere, no other symptoms.  HPI     Past Medical History:  Diagnosis Date   Anxiety    Bipolar 1 disorder (HCC)    Depression    Diabetes mellitus without complication (HCC)    Gallstones    Hyperlipidemia    Hypertension    Neuropathy    Schizophrenia (HCC)    Seizures (HCC)    X1- febrile seizure as a child-none since    Patient Active Problem List   Diagnosis Date Noted   Hypertensive urgency 09/01/2020   DKA (diabetic ketoacidoses) 09/12/2018   Suicidal ideation 09/12/2018   AKI (acute kidney injury) (HCC) 09/12/2018   Adjustment disorder with mixed disturbance of emotions and conduct    MDD (major depressive disorder), severe (HCC) 03/03/2018   Umbilical hernia with obstruction 11/24/2015   Symptomatic cholelithiasis 11/24/2015   Type 2 diabetes  mellitus with hyperglycemia (HCC)    Schizoaffective disorder, depressive type (HCC) 08/14/2011   UNSPECIFIED ANEMIA 01/23/2009   Essential hypertension 10/12/2008   HYPERCHOLESTEROLEMIA 01/01/2007   PANIC DISORDER 01/01/2007   Obsessive compulsive disorder 01/01/2007    Past Surgical History:  Procedure Laterality Date   CESAREAN SECTION  05/1998   X 1   CHOLECYSTECTOMY N/A 03/27/2016   Procedure: LAPAROSCOPIC CHOLECYSTECTOMY;  Surgeon: Jose Piscoya, MD;  Location: [facility] ORS;  Service: General;  Laterality: N/A;   TONSILLECTOMY AND ADENOIDECTOMY     age 7   TUBAL LIGATION       OB History   No obstetric history on file.     Family History  Problem Relation Age of Onset   Diabetes Mother    Heart disease Mother    Kidney disease Mother    Stroke Mother    Hyperlipidemia Mother    Diabetes Maternal Aunt     Social History   Tobacco Use   Smoking status: Former    Types: Cigarettes    Quit date: 03/14/2013    Years since quitting: 7.4   Smokeless tobacco: Never   Tobacco comments:    1 cigarette1-2 times year, only when she gets stressed  Vaping Use   Vaping Use: Never used  Substance Use Topics   Alcohol use: Yes    Alcohol/week: 1.0 standard drink    Types: 1 Cans of beer per week    Comment: One can of   beer every 2-3 months.    Drug use: No    Home Medications Prior to Admission medications   Medication Sig Start Date End Date Taking? Authorizing Provider  amLODipine (NORVASC) 5 MG tablet Take 1 tablet (5 mg total) by mouth daily. 12/15/19   [name], MD  atorvastatin (LIPITOR) 40 MG tablet Take 1 tablet (40 mg total) by mouth daily. 10/21/19   Argentina Donovan, PA-C  bacitracin ointment Apply 1 application topically 2 (two) times daily. Patient not taking: Reported on 01/24/2020 04/21/19   Montine Circle, PA-C  Blood Glucose Monitoring Suppl (TRUE METRIX METER) w/Device KIT 1 Device by Does not apply route 3 (three) times daily. For blood sugar checks  09/13/18   Barb Merino, MD  carvedilol (COREG) 3.125 MG tablet Take 1 tablet (3.125 mg total) by mouth 2 (two) times daily with a meal. 01/24/20   [name], MD  cephALEXin (KEFLEX) 500 MG capsule Take 1 capsule (500 mg total) by mouth 2 (two) times daily. 03/20/20   Quintella Reichert, MD  gabapentin (NEURONTIN) 100 MG capsule Take 1 capsule (100 mg total) by mouth 3 (three) times daily. 10/21/19 01/19/20  Argentina Donovan, PA-C  glipiZIDE (GLUCOTROL) 10 MG tablet Take 1 tablet (10 mg total) by mouth 2 (two) times daily before a meal. 10/21/19 11/20/19  Argentina Donovan, PA-C  glucose blood test strip Use as instructed 10/28/18   Argentina Donovan, PA-C  insulin glargine (LANTUS) 100 UNIT/ML injection Inject 0.15 mLs (15 Units total) into the skin at bedtime. 10/21/19   Argentina Donovan, PA-C  Insulin Syringe-Needle U-100 (SAFETY INSULIN SYRINGES) 30G X 5/16" 0.5 ML MISC 1 each by Does not apply route at bedtime. 10/21/19   Argentina Donovan, PA-C  lisinopril (ZESTRIL) 40 MG tablet Take 1 tablet (40 mg total) by mouth daily. For high blood pressure 12/15/19   [name], MD  loperamide (IMODIUM) 2 MG capsule Take 1 capsule (2 mg total) by mouth 4 (four) times daily as needed for diarrhea or loose stools. 10/16/18   Sherwood Gambler, MD  ondansetron (ZOFRAN ODT) 4 MG disintegrating tablet Take 1 tablet (4 mg total) by mouth every 8 (eight) hours as needed for nausea or vomiting. 03/20/20   Quintella Reichert, MD  QUEtiapine (SEROQUEL) 100 MG tablet Take 1 tablet (100 mg total) by mouth at bedtime. For mood control Patient not taking: Reported on 01/24/2020 10/21/19   Argentina Donovan, PA-C  TRUEplus Lancets 28G MISC 1 each by Does not apply route 3 (three) times daily. For blood sugar checks 09/13/18   Barb Merino, MD  venlafaxine XR (EFFEXOR XR) 75 MG 24 hr capsule Take 1 capsule (75 mg total) by mouth daily with breakfast. For depression Patient not taking: Reported on 01/24/2020 10/21/19   Argentina Donovan,  PA-C    Allergies    Patient has no known allergies.  Review of Systems   Review of Systems  Constitutional:  Negative for chills, diaphoresis, fatigue and fever.  HENT:  Negative for congestion, sore throat and trouble swallowing.   Eyes:  Negative for pain and visual disturbance.  Respiratory:  Negative for cough, shortness of breath and wheezing.   Cardiovascular:  Negative for chest pain, palpitations and leg swelling.  Gastrointestinal:  Negative for abdominal distention, abdominal pain, diarrhea, nausea and vomiting.  Genitourinary:  Negative for difficulty urinating.  Musculoskeletal:  Negative for back pain, neck pain and neck stiffness.  Skin:  Negative for pallor.  Neurological:  Negative for dizziness, speech difficulty, weakness and headaches.  Psychiatric/Behavioral:  Positive for hallucinations, self-injury and suicidal ideas. Negative for confusion.    Physical Exam Updated Vital Signs BP (!) 223/87   Pulse 62   Temp 98.4 F (36.9 C)   Resp 18   SpO2 100%   Physical Exam Constitutional:      General: She is not in acute distress.    Appearance: Normal appearance. She is not ill-appearing, toxic-appearing or diaphoretic.  HENT:     Mouth/Throat:     Mouth: Mucous membranes are moist.     Pharynx: Oropharynx is clear.  Eyes:     General: No scleral icterus.    Extraocular Movements: Extraocular movements intact.     Pupils: Pupils are equal, round, and reactive to light.  Cardiovascular:     Rate and Rhythm: Normal rate and regular rhythm.     Pulses: Normal pulses.     Heart sounds: Normal heart sounds.  Pulmonary:     Effort: Pulmonary effort is normal. No respiratory distress.     Breath sounds: Normal breath sounds. No stridor. No wheezing, rhonchi or rales.  Chest:     Chest wall: No tenderness.  Abdominal:     General: Abdomen is flat. There is no distension.     Palpations: Abdomen is soft.     Tenderness: There is no abdominal tenderness.  There is no guarding or rebound.  Musculoskeletal:        General: No swelling or tenderness. Normal range of motion.     Cervical back: Normal range of motion and neck supple. No rigidity.     Right lower leg: No edema.     Left lower leg: No edema.  Skin:    General: Skin is warm and dry.     Capillary Refill: Capillary refill takes less than 2 seconds.     Coloration: Skin is not pale.  Neurological:     General: No focal deficit present.     Mental Status: She is alert and oriented to person, place, and time.  Psychiatric:        Mood and Affect: Mood normal.        Behavior: Behavior normal.    ED Results / Procedures / Treatments   Labs (all labs ordered are listed, but only abnormal results are displayed) Labs Reviewed  COMPREHENSIVE METABOLIC PANEL - Abnormal; Notable for the following components:      Result Value   Glucose, Bld 191 (*)    BUN 33 (*)    Creatinine, Ser 3.60 (*)    GFR, Estimated 14 (*)    All other components within normal limits  CBC WITH DIFFERENTIAL/PLATELET - Abnormal; Notable for the following components:   RBC 3.48 (*)    Hemoglobin 10.3 (*)    HCT 31.7 (*)    All other components within normal limits  CBG MONITORING, ED - Abnormal; Notable for the following components:   Glucose-Capillary 187 (*)    All other components within normal limits  RESP PANEL BY RT-PCR (FLU A&B, COVID) [id number]  ETHANOL  RAPID URINE DRUG SCREEN, HOSP PERFORMED  HCG, QUANTITATIVE, PREGNANCY  URINALYSIS, ROUTINE W REFLEX MICROSCOPIC  I-STAT BETA HCG BLOOD, ED ([facility], [facility], [facility] ONLY)    EKG EKG Interpretation  Date/Time:  Friday September 01 2020 20:33:36 EDT Ventricular Rate:  61 PR Interval:  118 QRS Duration: 84 QT Interval:  454 QTC Calculation: 457 R Axis:   64 Text Interpretation: Normal sinus  rhythm Normal ECG Confirmed by Rees, Elizabeth ([id number]) on 09/01/2020 8:34:24 PM  Radiology No results found.  Procedures Procedures   Medications Ordered in  ED Medications  hydrALAZINE (APRESOLINE) tablet 100 mg (has no administration in time range)    ED Course  I have reviewed the triage vital signs and the nursing notes.  Pertinent labs & imaging results that were available during my care of the patient were reviewed by me and considered in my medical decision making (see chart for details).    MDM Rules/Calculators/A&P                           Patient presents to the emerge department today for suicide attempt, is calm and cooperative on exam, originally refusing vitals however after I spoke to her patient states that she will obtain blood work and vitals and cooperate.  Will obtain basic labs and see if we can medically clear, unfortunately patient has not been taking her insulin or hypertensive medications or other medications at this time.  Patient placed under IVC. Patient blood pressure 223/87, will give hydralazine for this.  Patient also does appear to have worsening creatinine of 3.6 today, baseline around 2.  Most likely worsening CKD.  Unfortunately unable to medically clear this patient due to hypertension and AKI at this time, will be medically admitted and then see psychiatry team.  Pt admitted to Dr. MacNeil.    The patient appears reasonably stabilized for admission considering the current resources, flow, and capabilities available in the ED at this time, and I doubt any other EMC requiring further screening and/or treatment in the ED prior to admission.   Final Clinical Impression(s) / ED Diagnoses Final diagnoses:  Suicide attempt (HCC)  Primary hypertension  Acute worsening of stage 4 chronic kidney disease (HCC)    Rx / DC Orders ED Discharge Orders     None        , , PA-C 09/01/20 2205    Rees, Elizabeth, MD 09/03/20 1103  

## 2020-09-01 NOTE — ED Triage Notes (Signed)
Pt was found standing in traffic trying to get hit by a car. Pt stated that she wants it all end due to being homeless. Pt denies HI and A/V hallucinations. Pt is currently refusing vital signs and bloodwork.

## 2020-09-01 NOTE — ED Notes (Signed)
US AT BEDSIDE

## 2020-09-01 NOTE — ED Notes (Signed)
Patient refused for me to get her vital signs

## 2020-09-01 NOTE — ED Notes (Signed)
Labeled urine specimen and culture sent to lab. ENMiles 

## 2020-09-02 DIAGNOSIS — N184 Chronic kidney disease, stage 4 (severe): Secondary | ICD-10-CM | POA: Insufficient documentation

## 2020-09-02 DIAGNOSIS — N179 Acute kidney failure, unspecified: Secondary | ICD-10-CM | POA: Insufficient documentation

## 2020-09-02 DIAGNOSIS — I160 Hypertensive urgency: Secondary | ICD-10-CM | POA: Diagnosis not present

## 2020-09-02 LAB — COMPREHENSIVE METABOLIC PANEL
ALT: 19 U/L (ref 0–44)
AST: 28 U/L (ref 15–41)
Albumin: 3.2 g/dL — ABNORMAL LOW (ref 3.5–5.0)
Alkaline Phosphatase: 60 U/L (ref 38–126)
Anion gap: 6 (ref 5–15)
BUN: 31 mg/dL — ABNORMAL HIGH (ref 6–20)
CO2: 24 mmol/L (ref 22–32)
Calcium: 8.9 mg/dL (ref 8.9–10.3)
Chloride: 106 mmol/L (ref 98–111)
Creatinine, Ser: 2.94 mg/dL — ABNORMAL HIGH (ref 0.44–1.00)
GFR, Estimated: 18 mL/min — ABNORMAL LOW (ref >60)
Glucose, Bld: 206 mg/dL — ABNORMAL HIGH (ref 70–99)
Potassium: 4.7 mmol/L (ref 3.5–5.1)
Sodium: 136 mmol/L (ref 135–145)
Total Bilirubin: 1 mg/dL (ref 0.3–1.2)
Total Protein: 6.6 g/dL (ref 6.5–8.1)

## 2020-09-02 LAB — GLUCOSE, CAPILLARY
Glucose-Capillary: 157 mg/dL — ABNORMAL HIGH (ref 70–99)
Glucose-Capillary: 151 mg/dL — ABNORMAL HIGH (ref 70–99)

## 2020-09-02 LAB — CBC
HCT: 27.7 % — ABNORMAL LOW (ref 36.0–46.0)
Hemoglobin: 9.5 g/dL — ABNORMAL LOW (ref 12.0–15.0)
MCH: 30.7 pg (ref 26.0–34.0)
MCHC: 34.3 g/dL (ref 30.0–36.0)
MCV: 89.6 fL (ref 80.0–100.0)
Platelets: 236 10*3/uL (ref 150–400)
RBC: 3.09 MIL/uL — ABNORMAL LOW (ref 3.87–5.11)
RDW: 13.1 % (ref 11.5–15.5)
WBC: 7.9 10*3/uL (ref 4.0–10.5)
nRBC: 0 % (ref 0.0–0.2)

## 2020-09-02 LAB — MAGNESIUM: Magnesium: 2.1 mg/dL (ref 1.7–2.4)

## 2020-09-02 LAB — HIV ANTIBODY (ROUTINE TESTING W REFLEX): HIV Screen 4th Generation wRfx: NONREACTIVE

## 2020-09-02 LAB — CBG MONITORING, ED
Glucose-Capillary: 191 mg/dL — ABNORMAL HIGH (ref 70–99)
Glucose-Capillary: 162 mg/dL — ABNORMAL HIGH (ref 70–99)

## 2020-09-02 LAB — PHOSPHORUS: Phosphorus: 2.9 mg/dL (ref 2.5–4.6)

## 2020-09-02 NOTE — Progress Notes (Signed)
A consult was placed to the hospital IV Nurse for a new iv site; assessed and attempted x  in left forearm w ultrasound; unable to thread; very poor veins noted; pt anxious, needing to use the bathroom, wanting to shower;  sitter at bedside;  RN aware of one IV attempt;  will complete this call for now and have the RN place a new consult if the IV is still needed;  RN has sent a secure chat to the MD.

## 2020-09-02 NOTE — Progress Notes (Signed)
Continue PROGRESS NOTE    Lorraine Turner  [id number] DOB: 27-Oct-1960 DOA: 09/01/2020 PCP: [name], MD  Brief Narrative: This 60 years old female with a PMH significant for hypertension, depression, type 2 diabetes, dyslipidemia, schizoaffective disorder, CKD stage IIIb presented in the ED with suicidal ideations.  Patient was initially found by police, standing in the middle of traffic, trying to get hit by a car.  Patient has been depressed for last few months after becoming homeless. She has been off of her regular medication due to inability to pay.  Patient also has psychiatric illness previously on psych meds but again she has been off of for a few months.  She has a history of suicidal ideations in the past ,  she was evaluated by psych inpatient in September 30, 2020.  Patient was found to be hypertensive in the ED, systolic blood pressure [id number]. Patient is admitted for suicidal ideation and hypertensive urgency.  Patient was resumed on her blood pressure medications.  She denies any suicidal homicidal ideation at present.  Assessment & Plan:   Active Problems:   Primary hypertension   Schizoaffective disorder, depressive type ([place])   Suicide attempt ([place])   Type 2 diabetes mellitus with hyperglycemia ([place])   Suicide ideation   AKI (acute kidney injury) ([place])   Hypertensive urgency   CKD (chronic kidney disease), stage III ([place])   Suicidal ideations : Patient presented in the ED after trying to commit suicide by getting hit by car. Patient does have history of depression, schizoaffective disorder , prior suicidal ideations. recent stressors being homeless, financial circumstances. She ran out of her antidepressant medications. Continue suicidal precautions. Patient needs psych evaluation once medically clear.   Hypertensive urgency. She is found to have systolic BP greater than [id number]. Patient has been noncompliant with blood pressure medications. She used to be on  lisinopril, Norvasc, Coreg.  Has not taken medication in few months. Will resume Norvasc and Coreg.  Will hold lisinopril given AKI. Continue to monitor blood pressure.   Type 2 diabetes: Obtain hemoglobin A1c, ADA diet. Regular insulin sliding scale.  AKI on CKD stage IIIb: Patient does have CKD which has been progressive. Baseline serum creatinine 1.9-2.3, presented with serum creatinine of 3.6. Renal ultrasound unremarkable. Avoid nephrotoxic medications Continue gentle IV hydration. Renal  Functions improving.   History of depression, schizoaffective disorder Psych. Consulted awaiting recommendation.    DVT prophylaxis: SCDs Code Status: Full code. Family Communication: No family at bed side. Disposition Plan:     Status is: Observation  The patient remains OBS appropriate and will d/c before 2 midnights.  Dispo: The patient is from: Home              Anticipated d/c is to: Home              Patient currently is not medically stable to d/c.   Difficult to place patient No   Consultants:  Psychiatry  Procedures: None Antimicrobials:   Anti-infectives (From admission, onward)    None        Subjective: Patient was seen and examined in the hallway in the ED.  Overnight events noted.   Patient denies any suicidal/  homicidal ideations at present.  She reports feeling slightly improved.  Objective: Vitals:   09/02/20 0938 09/02/20 1008 09/02/20 1206 09/02/20 1300  BP: (!) 153/64 135/65 (!) 155/66   Pulse: 62 62 60   Resp: '18 18 18   '$ Temp:   98.4 F (36.9  C)   TempSrc:   Oral   SpO2: 98% 98% 99%   Weight:    74 kg  Height:    '5\' 5"'$  (1.651 m)   No intake or output data in the 24 hours ending 09/02/20 1520 Filed Weights   09/02/20 1300  Weight: 74 kg    Examination:  General exam: Appears calm and comfortable , not in any acute distress.  Deconditioned Respiratory system: Clear to auscultation. Respiratory effort normal. Cardiovascular system: S1  & S2 heard, RRR. No JVD, murmurs, rubs, gallops or clicks. No pedal edema. Gastrointestinal system: Abdomen is nondistended, soft and nontender. No organomegaly or masses felt. Normal bowel sounds heard. Central nervous system: Alert and oriented. No focal neurological deficits. Extremities: Symmetric 5 x 5 power. Skin: No rashes, lesions or ulcers Psychiatry: Judgement and insight appear normal. Mood & affect appropriate.     Data Reviewed: I have personally reviewed following labs and imaging studies  CBC: Recent Labs  Lab 09/01/20 2050 09/02/20 0440  WBC 6.9 7.9  NEUTROABS 5.1  --   HGB 10.3* 9.5*  HCT 31.7* 27.7*  MCV 91.1 89.6  PLT 262 [id number]   Basic Metabolic Panel: Recent Labs  Lab 09/01/20 2050 09/02/20 0440  NA 139 136  K 4.5 4.7  CL 108 106  CO2 24 24  GLUCOSE 191* 206*  BUN 33* 31*  CREATININE 3.60* 2.94*  CALCIUM 9.5 8.9  MG  --  2.1  PHOS  --  2.9   GFR: Estimated Creatinine Clearance: 20.8 mL/min (A) (by C-G formula based on SCr of 2.94 mg/dL (H)). Liver Function Tests: Recent Labs  Lab 09/01/20 2050 09/02/20 0440  AST 15 28  ALT 14 19  ALKPHOS 78 60  BILITOT 0.7 1.0  PROT 7.7 6.6  ALBUMIN 4.0 3.2*   No results for input(s): LIPASE, AMYLASE in the last 168 hours. No results for input(s): AMMONIA in the last 168 hours. Coagulation Profile: No results for input(s): INR, PROTIME in the last 168 hours. Cardiac Enzymes: No results for input(s): CKTOTAL, CKMB, CKMBINDEX, TROPONINI in the last 168 hours. BNP (last 3 results) No results for input(s): PROBNP in the last 8760 hours. HbA1C: No results for input(s): HGBA1C in the last 72 hours. CBG: Recent Labs  Lab 09/01/20 2040 09/02/20 0752 09/02/20 1107  GLUCAP 187* 191* 162*   Lipid Profile: No results for input(s): CHOL, HDL, LDLCALC, TRIG, CHOLHDL, LDLDIRECT in the last 72 hours. Thyroid Function Tests: No results for input(s): TSH, T4TOTAL, FREET4, T3FREE, THYROIDAB in the last 72  hours. Anemia Panel: No results for input(s): VITAMINB12, FOLATE, FERRITIN, TIBC, IRON, RETICCTPCT in the last 72 hours. Sepsis Labs: No results for input(s): PROCALCITON, LATICACIDVEN in the last 168 hours.  Recent Results (from the past 240 hour(s))  Resp Panel by RT-PCR (Flu A&B, Covid) Nasopharyngeal Swab     Status: None   Collection Time: 09/01/20  7:04 PM   Specimen: Nasopharyngeal Swab; Nasopharyngeal(NP) swabs in vial transport medium  Result Value Ref Range Status   SARS Coronavirus 2 by RT PCR NEGATIVE NEGATIVE Final    Comment: (NOTE) SARS-CoV-2 target nucleic acids are NOT DETECTED.  The SARS-CoV-2 RNA is generally detectable in upper respiratory specimens during the acute phase of infection. The lowest concentration of SARS-CoV-2 viral copies this assay can detect is 138 copies/mL. A negative result does not preclude SARS-Cov-2 infection and should not be used as the sole basis for treatment or other patient management decisions. A negative result may  occur with  improper specimen collection/handling, submission of specimen other than nasopharyngeal swab, presence of viral mutation(s) within the areas targeted by this assay, and inadequate number of viral copies(<138 copies/mL). A negative result must be combined with clinical observations, patient history, and epidemiological information. The expected result is Negative.  Fact Sheet for Patients:  EntrepreneurPulse.com.au  Fact Sheet for Healthcare Providers:  IncredibleEmployment.be  This test is no t yet approved or cleared by the [place] FDA and  has been authorized for detection and/or diagnosis of SARS-CoV-2 by FDA under an Emergency Use Authorization (EUA). This EUA will remain  in effect (meaning this test can be used) for the duration of the COVID-19 declaration under Section 564(b)(1) of the Act, 21 U.S.C.section 360bbb-3(b)(1), unless the authorization is  terminated  or revoked sooner.       Influenza A by PCR NEGATIVE NEGATIVE Final   Influenza B by PCR NEGATIVE NEGATIVE Final    Comment: (NOTE) The Xpert Xpress SARS-CoV-2/FLU/RSV plus assay is intended as an aid in the diagnosis of influenza from Nasopharyngeal swab specimens and should not be used as a sole basis for treatment. Nasal washings and aspirates are unacceptable for Xpert Xpress SARS-CoV-2/FLU/RSV testing.  Fact Sheet for Patients: EntrepreneurPulse.com.au  Fact Sheet for Healthcare Providers: IncredibleEmployment.be  This test is not yet approved or cleared by the [place] FDA and has been authorized for detection and/or diagnosis of SARS-CoV-2 by FDA under an Emergency Use Authorization (EUA). This EUA will remain in effect (meaning this test can be used) for the duration of the COVID-19 declaration under Section 564(b)(1) of the Act, 21 U.S.C. section 360bbb-3(b)(1), unless the authorization is terminated or revoked.  Performed at [facility], [place] [address], [place], [place] [address]     Radiology Studies: US RENAL  Result Date: 09/01/2020 CLINICAL DATA:  Acute kidney injury superimposed on chronic kidney disease. EXAM: RENAL / URINARY TRACT ULTRASOUND COMPLETE COMPARISON:  Noncontrast CT 09/17/2017 FINDINGS: Right Kidney: Renal measurements: 9.1 x 4.0 x 4.1 cm = volume: 76 mL. Borderline increased parenchymal echogenicity. No hydronephrosis. No focal lesion or stone. Left Kidney: Renal measurements: 9.4 x 5.4 x 4.6 cm = volume: 124 mL. No hydronephrosis. Normal parenchymal echogenicity. No visualized lesion or stone. Bladder: Appears normal for degree of bladder distention. Both ureteral jets are seen. Other: None. IMPRESSION: 1. No obstructive uropathy. 2. Mild increased right renal parenchymal echogenicity consistent with chronic medical renal disease. No focal renal abnormality or stone.  Electronically Signed   By: Keith Rake M.D.   On: 09/01/2020 23:13    Scheduled Meds:  amLODipine  5 mg Oral Daily   carvedilol  3.125 mg Oral BID WC   insulin aspart  0-9 Units Subcutaneous TID WC   Continuous Infusions:  lactated ringers 75 mL/hr at 09/02/20 0828     LOS: 0 days    Time spent: 35 mins    Rakwon Letourneau, MD [facility]   If 7PM-7AM, please contact night-coverage

## 2020-09-02 NOTE — ED Notes (Signed)
Madiha Bambrick NSIAH, RN was sent a secure message in regards to having a bed assignment to 1319.

## 2020-09-02 NOTE — ED Notes (Signed)
Patient is ready for admission to room 1319 via stretcher

## 2020-09-02 NOTE — Plan of Care (Signed)
  Problem: Health Behavior/Discharge Planning: Goal: Ability to manage health-related needs will improve Outcome: Progressing   Problem: Clinical Measurements: Goal: Will remain free from infection Outcome: Progressing   Problem: Nutrition: Goal: Adequate nutrition will be maintained Outcome: Progressing   Problem: Coping: Goal: Level of anxiety will decrease Outcome: Progressing

## 2020-09-02 NOTE — ED Notes (Signed)
Patient is alert and cooperative.  Pleasant when speaking to.  A sitter is at the bedside

## 2020-09-02 NOTE — Progress Notes (Signed)
Telephone orders to d/c IV fluids received from Dr. Dwyane Dee. Same discontinued.

## 2020-09-03 DIAGNOSIS — Z79899 Other long term (current) drug therapy: Secondary | ICD-10-CM | POA: Diagnosis not present

## 2020-09-03 DIAGNOSIS — Z8249 Family history of ischemic heart disease and other diseases of the circulatory system: Secondary | ICD-10-CM | POA: Diagnosis not present

## 2020-09-03 DIAGNOSIS — F419 Anxiety disorder, unspecified: Secondary | ICD-10-CM | POA: Diagnosis present

## 2020-09-03 DIAGNOSIS — Z5900 Homelessness unspecified: Secondary | ICD-10-CM | POA: Diagnosis not present

## 2020-09-03 DIAGNOSIS — E1142 Type 2 diabetes mellitus with diabetic polyneuropathy: Secondary | ICD-10-CM | POA: Diagnosis present

## 2020-09-03 DIAGNOSIS — N184 Chronic kidney disease, stage 4 (severe): Secondary | ICD-10-CM | POA: Diagnosis present

## 2020-09-03 DIAGNOSIS — Z9114 Patient's other noncompliance with medication regimen: Secondary | ICD-10-CM | POA: Diagnosis not present

## 2020-09-03 DIAGNOSIS — N390 Urinary tract infection, site not specified: Secondary | ICD-10-CM | POA: Diagnosis present

## 2020-09-03 DIAGNOSIS — N179 Acute kidney failure, unspecified: Secondary | ICD-10-CM | POA: Diagnosis present

## 2020-09-03 DIAGNOSIS — R45851 Suicidal ideations: Secondary | ICD-10-CM | POA: Diagnosis present

## 2020-09-03 DIAGNOSIS — Z20822 Contact with and (suspected) exposure to covid-19: Secondary | ICD-10-CM | POA: Diagnosis present

## 2020-09-03 DIAGNOSIS — Z794 Long term (current) use of insulin: Secondary | ICD-10-CM | POA: Diagnosis not present

## 2020-09-03 DIAGNOSIS — T1491XA Suicide attempt, initial encounter: Secondary | ICD-10-CM | POA: Diagnosis present

## 2020-09-03 DIAGNOSIS — Z841 Family history of disorders of kidney and ureter: Secondary | ICD-10-CM | POA: Diagnosis not present

## 2020-09-03 DIAGNOSIS — Z833 Family history of diabetes mellitus: Secondary | ICD-10-CM | POA: Diagnosis not present

## 2020-09-03 DIAGNOSIS — E1165 Type 2 diabetes mellitus with hyperglycemia: Secondary | ICD-10-CM | POA: Diagnosis present

## 2020-09-03 DIAGNOSIS — F251 Schizoaffective disorder, depressive type: Secondary | ICD-10-CM | POA: Diagnosis present

## 2020-09-03 DIAGNOSIS — E1122 Type 2 diabetes mellitus with diabetic chronic kidney disease: Secondary | ICD-10-CM | POA: Diagnosis present

## 2020-09-03 DIAGNOSIS — Z823 Family history of stroke: Secondary | ICD-10-CM | POA: Diagnosis not present

## 2020-09-03 DIAGNOSIS — I160 Hypertensive urgency: Secondary | ICD-10-CM | POA: Diagnosis present

## 2020-09-03 DIAGNOSIS — I129 Hypertensive chronic kidney disease with stage 1 through stage 4 chronic kidney disease, or unspecified chronic kidney disease: Secondary | ICD-10-CM | POA: Diagnosis present

## 2020-09-03 DIAGNOSIS — E785 Hyperlipidemia, unspecified: Secondary | ICD-10-CM | POA: Diagnosis present

## 2020-09-03 LAB — BASIC METABOLIC PANEL
Anion gap: 5 (ref 5–15)
BUN: 30 mg/dL — ABNORMAL HIGH (ref 6–20)
CO2: 23 mmol/L (ref 22–32)
Calcium: 9 mg/dL (ref 8.9–10.3)
Chloride: 107 mmol/L (ref 98–111)
Creatinine, Ser: 3.43 mg/dL — ABNORMAL HIGH (ref 0.44–1.00)
GFR, Estimated: 15 mL/min — ABNORMAL LOW (ref >60)
Glucose, Bld: 165 mg/dL — ABNORMAL HIGH (ref 70–99)
Potassium: 4.1 mmol/L (ref 3.5–5.1)
Sodium: 135 mmol/L (ref 135–145)

## 2020-09-03 LAB — CBC
HCT: 29.1 % — ABNORMAL LOW (ref 36.0–46.0)
Hemoglobin: 9.5 g/dL — ABNORMAL LOW (ref 12.0–15.0)
MCH: 29.7 pg (ref 26.0–34.0)
MCHC: 32.6 g/dL (ref 30.0–36.0)
MCV: 90.9 fL (ref 80.0–100.0)
Platelets: 224 10*3/uL (ref 150–400)
RBC: 3.2 MIL/uL — ABNORMAL LOW (ref 3.87–5.11)
RDW: 13.1 % (ref 11.5–15.5)
WBC: 8.8 10*3/uL (ref 4.0–10.5)
nRBC: 0 % (ref 0.0–0.2)

## 2020-09-03 LAB — GLUCOSE, CAPILLARY
Glucose-Capillary: 107 mg/dL — ABNORMAL HIGH (ref 70–99)
Glucose-Capillary: 156 mg/dL — ABNORMAL HIGH (ref 70–99)
Glucose-Capillary: 156 mg/dL — ABNORMAL HIGH (ref 70–99)
Glucose-Capillary: 171 mg/dL — ABNORMAL HIGH (ref 70–99)
Glucose-Capillary: 181 mg/dL — ABNORMAL HIGH (ref 70–99)

## 2020-09-03 MED ORDER — QUETIAPINE FUMARATE 50 MG PO TABS
100.0000 mg | ORAL_TABLET | Freq: Every day | ORAL | Status: DC
  Administered 2020-09-03 – 2020-09-04 (×2): 100 mg via ORAL
  Filled 2020-09-03 (×2): qty 2

## 2020-09-03 MED ORDER — SERTRALINE HCL 25 MG PO TABS
25.0000 mg | ORAL_TABLET | Freq: Every day | ORAL | Status: DC
  Administered 2020-09-03 – 2020-09-04 (×2): 25 mg via ORAL
  Filled 2020-09-03 (×2): qty 1

## 2020-09-03 MED ORDER — SODIUM CHLORIDE 0.9 % IV SOLN
1.0000 g | INTRAVENOUS | Status: AC
  Administered 2020-09-03 – 2020-09-05 (×3): 1 g via INTRAVENOUS
  Filled 2020-09-03 (×2): qty 1
  Filled 2020-09-03: qty 10

## 2020-09-03 MED ORDER — SODIUM CHLORIDE 0.9 % IV SOLN: INTRAVENOUS | Status: DC

## 2020-09-03 MED ORDER — VENLAFAXINE HCL ER 75 MG PO CP24
75.0000 mg | ORAL_CAPSULE | Freq: Every day | ORAL | Status: DC
  Administered 2020-09-03: 75 mg via ORAL
  Filled 2020-09-03: qty 1

## 2020-09-03 MED ORDER — BUSPIRONE HCL 5 MG PO TABS
5.0000 mg | ORAL_TABLET | Freq: Two times a day (BID) | ORAL | Status: DC
  Administered 2020-09-03 – 2020-09-05 (×5): 5 mg via ORAL
  Filled 2020-09-03 (×5): qty 1

## 2020-09-03 NOTE — Progress Notes (Signed)
Continue PROGRESS NOTE    Lorraine Turner  [id number] DOB: [birth year] DOA: 09/01/2020 PCP: [name], MD  Brief Narrative: This 60 years old female with a PMH significant for hypertension, depression, type 2 diabetes, dyslipidemia, schizoaffective disorder, CKD stage IIIb presented in the ED with suicidal ideations.  Patient was initially found by police, standing in the middle of traffic, trying to get hit by a car.  Patient has been depressed for last few months after becoming homeless. She has been off of her regular medication due to inability to pay.  Patient also has psychiatric illness previously on psych meds but again she has been off of for a few months.  She has a history of suicidal ideations in the past ,  she was evaluated by psych inpatient in September 30, 2020. Patient was found to be hypertensive in the ED, systolic blood pressure [id number]. Patient is admitted for suicidal ideations and hypertensive urgency.  Patient was resumed on her blood pressure medications.  She denies any suicidal/ homicidal ideation at present.  Assessment & Plan:   Active Problems:   Primary hypertension   Schizoaffective disorder, depressive type ([place])   Suicide attempt ([place])   Type 2 diabetes mellitus with hyperglycemia ([place])   Suicide ideation   AKI (acute kidney injury) ([place])   Hypertensive urgency   CKD (chronic kidney disease), stage III ([place])   Suicidal ideations : Patient presented in the ED after trying to commit suicide by getting hit by car. Patient does have history of depression, schizoaffective disorder , prior suicidal ideations. recent stressors being homeless, financial circumstances. She ran out of her antidepressant medications. Resume Home medications. Continue suicidal precautions. Psychiatry consulted.  Awaiting recommendation.   Hypertensive urgency > Improving She was found to have systolic BP greater than [id number]. Patient has been noncompliant with blood pressure  medications. She used to be on lisinopril, Norvasc, Coreg.  Has not taken medication in few months. Continue Norvasc and Coreg. Hold lisinopril given AKI. Continue to monitor blood pressure.   Type 2 diabetes: Obtain hemoglobin A1c, ADA diet. Continue regular insulin sliding scale. Diabetic coordinator consult  AKI on CKD stage IIIb: Patient does have CKD which has been progressive. Baseline serum creatinine 1.9-2.3, presented with serum creatinine of 3.6. Renal ultrasound : Chronic medical disease. Avoid nephrotoxic medications. Continue gentle IV hydration.  Continue to monitor serum creatinine 3.60 >2.92 >3.42   History of depression, schizoaffective disorder Psych. Consulted awaiting recommendation. Resume venlafaxine, BuSpar and Seroquel  UTI: Continue ceftriaxone for 3 days. Follow-up urine culture.    DVT prophylaxis: SCDs Code Status: Full code. Family Communication: No family at bed side. Disposition Plan:     Status is: Observation  The patient remains OBS appropriate and will d/c before 2 midnights.  Dispo: The patient is from: Home              Anticipated d/c is to: Home              Patient currently is not medically stable to d/c.   Difficult to place patient No   Consultants:  Psychiatry  Procedures: None Antimicrobials:   Anti-infectives (From admission, onward)    Start     Dose/Rate Route Frequency Ordered Stop   09/03/20 1000  cefTRIAXone (ROCEPHIN) 1 g in sodium chloride 0.9 % 100 mL IVPB        1 g 200 mL/hr over 30 Minutes Intravenous Every 24 hours 09/03/20 0803  Subjective: Patient was seen and examined in the hallway in the ED.  Overnight events noted.   Patient denies any suicidal /  homicidal ideations at present.  She reports feeling slightly improved. She denies any suicidal ideations at this time.  Blood pressure has improved.   Objective: Vitals:   09/02/20 1206 09/02/20 1300 09/02/20 2302 09/03/20 0530  BP:  (!) 155/66  (!) 154/60 (!) 147/67  Pulse: 60  66 62  Resp: '18  17 17  '$ Temp: 98.4 F (36.9 C)  98.6 F (37 C) 99.7 F (37.6 C)  TempSrc: Oral  Oral Oral  SpO2: 99%  98% 100%  Weight:  74 kg    Height:  '5\' 5"'$  (1.651 m)      Intake/Output Summary (Last 24 hours) at 09/03/2020 1055 Last data filed at 09/02/2020 1500 Gross per 24 hour  Intake 1357.44 ml  Output --  Net 1357.44 ml   Filed Weights   09/02/20 1300  Weight: 74 kg    Examination:  General exam: Appears calm and comfortable , not in any acute distress.  Deconditioned Respiratory system: Clear to auscultation. Respiratory effort normal. Cardiovascular system: S1 & S2 heard, RRR. No JVD, murmurs, rubs, gallops or clicks. No pedal edema. Gastrointestinal system: Abdomen is nondistended, soft and nontender. No organomegaly or masses felt. Normal bowel sounds heard. Central nervous system: Alert and oriented. No focal neurological deficits. Extremities: No edema, no cyanosis, no clubbing. Skin: No rashes, lesions or ulcers Psychiatry: Judgement and insight appear normal. Mood & affect appropriate.     Data Reviewed: I have personally reviewed following labs and imaging studies  CBC: Recent Labs  Lab 09/01/20 2050 09/02/20 0440 09/03/20 0521  WBC 6.9 7.9 8.8  NEUTROABS 5.1  --   --   HGB 10.3* 9.5* 9.5*  HCT 31.7* 27.7* 29.1*  MCV 91.1 89.6 90.9  PLT 262 236 [id number]   Basic Metabolic Panel: Recent Labs  Lab 09/01/20 2050 09/02/20 0440 09/03/20 0521  NA 139 136 135  K 4.5 4.7 4.1  CL 108 106 107  CO2 '24 24 23  '$ GLUCOSE 191* 206* 165*  BUN 33* 31* 30*  CREATININE 3.60* 2.94* 3.43*  CALCIUM 9.5 8.9 9.0  MG  --  2.1  --   PHOS  --  2.9  --    GFR: Estimated Creatinine Clearance: 17.8 mL/min (A) (by C-G formula based on SCr of 3.43 mg/dL (H)). Liver Function Tests: Recent Labs  Lab 09/01/20 2050 09/02/20 0440  AST 15 28  ALT 14 19  ALKPHOS 78 60  BILITOT 0.7 1.0  PROT 7.7 6.6  ALBUMIN 4.0 3.2*    No results for input(s): LIPASE, AMYLASE in the last 168 hours. No results for input(s): AMMONIA in the last 168 hours. Coagulation Profile: No results for input(s): INR, PROTIME in the last 168 hours. Cardiac Enzymes: No results for input(s): CKTOTAL, CKMB, CKMBINDEX, TROPONINI in the last 168 hours. BNP (last 3 results) No results for input(s): PROBNP in the last 8760 hours. HbA1C: No results for input(s): HGBA1C in the last 72 hours. CBG: Recent Labs  Lab 09/02/20 1107 09/02/20 1711 09/02/20 1946 09/03/20 0020 09/03/20 0801  GLUCAP 162* 157* 151* 181* 156*   Lipid Profile: No results for input(s): CHOL, HDL, LDLCALC, TRIG, CHOLHDL, LDLDIRECT in the last 72 hours. Thyroid Function Tests: No results for input(s): TSH, T4TOTAL, FREET4, T3FREE, THYROIDAB in the last 72 hours. Anemia Panel: No results for input(s): VITAMINB12, FOLATE, FERRITIN, TIBC, IRON, RETICCTPCT in  the last 72 hours. Sepsis Labs: No results for input(s): PROCALCITON, LATICACIDVEN in the last 168 hours.  Recent Results (from the past 240 hour(s))  Resp Panel by RT-PCR (Flu A&B, Covid) Nasopharyngeal Swab     Status: None   Collection Time: 09-29-20  7:04 PM   Specimen: Nasopharyngeal Swab; Nasopharyngeal(NP) swabs in vial transport medium  Result Value Ref Range Status   SARS Coronavirus 2 by RT PCR NEGATIVE NEGATIVE Final    Comment: (NOTE) SARS-CoV-2 target nucleic acids are NOT DETECTED.  The SARS-CoV-2 RNA is generally detectable in upper respiratory specimens during the acute phase of infection. The lowest concentration of SARS-CoV-2 viral copies this assay can detect is 138 copies/mL. A negative result does not preclude SARS-Cov-2 infection and should not be used as the sole basis for treatment or other patient management decisions. A negative result may occur with  improper specimen collection/handling, submission of specimen other than nasopharyngeal swab, presence of viral mutation(s)  within the areas targeted by this assay, and inadequate number of viral copies(<138 copies/mL). A negative result must be combined with clinical observations, patient history, and epidemiological information. The expected result is Negative.  Fact Sheet for Patients:  EntrepreneurPulse.com.au  Fact Sheet for Healthcare Providers:  IncredibleEmployment.be  This test is no t yet approved or cleared by the [place] FDA and  has been authorized for detection and/or diagnosis of SARS-CoV-2 by FDA under an Emergency Use Authorization (EUA). This EUA will remain  in effect (meaning this test can be used) for the duration of the COVID-19 declaration under Section 564(b)(1) of the Act, 21 U.S.C.section 360bbb-3(b)(1), unless the authorization is terminated  or revoked sooner.       Influenza A by PCR NEGATIVE NEGATIVE Final   Influenza B by PCR NEGATIVE NEGATIVE Final    Comment: (NOTE) The Xpert Xpress SARS-CoV-2/FLU/RSV plus assay is intended as an aid in the diagnosis of influenza from Nasopharyngeal swab specimens and should not be used as a sole basis for treatment. Nasal washings and aspirates are unacceptable for Xpert Xpress SARS-CoV-2/FLU/RSV testing.  Fact Sheet for Patients: EntrepreneurPulse.com.au  Fact Sheet for Healthcare Providers: IncredibleEmployment.be  This test is not yet approved or cleared by the [place] FDA and has been authorized for detection and/or diagnosis of SARS-CoV-2 by FDA under an Emergency Use Authorization (EUA). This EUA will remain in effect (meaning this test can be used) for the duration of the COVID-19 declaration under Section 564(b)(1) of the Act, 21 U.S.C. section 360bbb-3(b)(1), unless the authorization is terminated or revoked.  Performed at [facility], [place] [address] [place], [place] [address]     Radiology Studies: US  RENAL  Result Date: 09-29-20 CLINICAL DATA:  Acute kidney injury superimposed on chronic kidney disease. EXAM: RENAL / URINARY TRACT ULTRASOUND COMPLETE COMPARISON:  Noncontrast CT 09/17/2017 FINDINGS: Right Kidney: Renal measurements: 9.1 x 4.0 x 4.1 cm = volume: 76 mL. Borderline increased parenchymal echogenicity. No hydronephrosis. No focal lesion or stone. Left Kidney: Renal measurements: 9.4 x 5.4 x 4.6 cm = volume: 124 mL. No hydronephrosis. Normal parenchymal echogenicity. No visualized lesion or stone. Bladder: Appears normal for degree of bladder distention. Both ureteral jets are seen. Other: None. IMPRESSION: 1. No obstructive uropathy. 2. Mild increased right renal parenchymal echogenicity consistent with chronic medical renal disease. No focal renal abnormality or stone. Electronically Signed   By: Keith Rake M.D.   On: 09-29-2020 23:13    Scheduled Meds:  amLODipine  5 mg Oral  Daily   busPIRone  5 mg Oral BID   carvedilol  3.125 mg Oral BID WC   insulin aspart  0-9 Units Subcutaneous TID WC   QUEtiapine  100 mg Oral QHS   venlafaxine XR  75 mg Oral Q breakfast   Continuous Infusions:  sodium chloride 100 mL/hr at 09/03/20 1020   cefTRIAXone (ROCEPHIN)  IV 1 g (09/03/20 1023)     LOS: 0 days    Time spent: 25 mins    Edom Schmuhl, MD [facility]   If 7PM-7AM, please contact night-coverage

## 2020-09-03 NOTE — Consult Note (Addendum)
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 toleration  -communicated recommendations to primary team via epic secure chat  Disposition: No evidence of imminent risk to self or others at present.    Ival Bible, MD Attending psychiatrist 09/03/2020 2:08 PM

## 2020-09-04 LAB — GLUCOSE, CAPILLARY
Glucose-Capillary: 115 mg/dL — ABNORMAL HIGH (ref 70–99)
Glucose-Capillary: 139 mg/dL — ABNORMAL HIGH (ref 70–99)
Glucose-Capillary: 149 mg/dL — ABNORMAL HIGH (ref 70–99)
Glucose-Capillary: 185 mg/dL — ABNORMAL HIGH (ref 70–99)

## 2020-09-04 LAB — BASIC METABOLIC PANEL
Anion gap: 5 (ref 5–15)
BUN: 29 mg/dL — ABNORMAL HIGH (ref 6–20)
CO2: 23 mmol/L (ref 22–32)
Calcium: 8.6 mg/dL — ABNORMAL LOW (ref 8.9–10.3)
Chloride: 110 mmol/L (ref 98–111)
Creatinine, Ser: 3 mg/dL — ABNORMAL HIGH (ref 0.44–1.00)
GFR, Estimated: 17 mL/min — ABNORMAL LOW (ref >60)
Glucose, Bld: 122 mg/dL — ABNORMAL HIGH (ref 70–99)
Potassium: 4.1 mmol/L (ref 3.5–5.1)
Sodium: 138 mmol/L (ref 135–145)

## 2020-09-04 LAB — HEMOGLOBIN A1C
Hgb A1c MFr Bld: 8.2 % — ABNORMAL HIGH (ref 4.8–5.6)
Mean Plasma Glucose: 189 mg/dL

## 2020-09-04 MED ORDER — AMLODIPINE BESYLATE 10 MG PO TABS
10.0000 mg | ORAL_TABLET | Freq: Every day | ORAL | Status: DC
  Administered 2020-09-04 – 2020-09-05 (×2): 10 mg via ORAL
  Filled 2020-09-04 (×2): qty 1

## 2020-09-04 MED ORDER — SERTRALINE HCL 50 MG PO TABS
50.0000 mg | ORAL_TABLET | Freq: Every day | ORAL | Status: DC
  Administered 2020-09-05: 50 mg via ORAL
  Filled 2020-09-04: qty 1
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
## 2020-09-04 NOTE — Progress Notes (Signed)
Continue PROGRESS NOTE    Lorraine Turner  [id number] DOB: 1961/01/22 DOA: 09/01/2020 PCP: [name], MD  Brief Narrative: This 60 years old female with a PMH significant for hypertension, depression, type 2 diabetes, dyslipidemia, schizoaffective disorder, CKD stage IIIb presented in the ED with suicidal ideations.  Patient was initially found by police, standing in the middle of traffic, trying to get hit by a car.  Patient has been depressed for last few months after becoming homeless. She has been off of her regular medication due to inability to pay.  Patient also has psychiatric illness previously on psych meds but again she has been off of for a few months.  She has a history of suicidal ideations in the past ,  she was evaluated by psych inpatient in September 30, 2020. Patient was found to be hypertensive in the ED, systolic blood pressure [id number]. Patient is admitted for suicidal ideations and hypertensive urgency.  Patient was resumed on her blood pressure medications.  She denies any suicidal/ homicidal ideations at present.  Assessment & Plan:   Active Problems:   Primary hypertension   Schizoaffective disorder, depressive type ([place])   Suicide attempt ([place])   Type 2 diabetes mellitus with hyperglycemia ([place])   Suicide ideation   AKI (acute kidney injury) ([place])   Hypertensive urgency   CKD (chronic kidney disease), stage III ([place])   Suicidal ideations : Patient presented in the ED after trying to commit suicide by getting hit by car. Patient does have history of depression, schizoaffective disorder , prior suicidal ideations. recent stressors being homeless, financial circumstances. She ran out of her antidepressant medications. Resume Home medications. Psychiatry consulted.  Medications adjusted.  Patient is not suicidal. Discontinue suicidal precautions.    Hypertensive urgency > Improving She was found to have systolic BP greater than [id number]. Patient has been  noncompliant with blood pressure medications. She used to be on lisinopril, Norvasc, Coreg.  Has not taken medication in few months. Continue Norvasc and Coreg. Hold lisinopril given AKI. Continue to monitor blood pressure.   Type 2 diabetes: Obtain hemoglobin A1c, ADA diet. Continue regular insulin sliding scale. Diabetic coordinator consult.  AKI on CKD stage IIIb: Patient does have CKD which has been progressive. Baseline serum creatinine 1.9-2.3, presented with serum creatinine of 3.6. Renal ultrasound : Chronic medical disease. Avoid nephrotoxic medications. Continue gentle IV hydration.  Continue to monitor serum creatinine 3.60 >2.92 >3.42> 3.0   History of depression, schizoaffective disorder Psych. Consulted, adjusted medications. Continue BuSpar and Seroquel, venlafaxine discontinued Started on Zoloft 25 mg daily.  UTI: Continue ceftriaxone for 3 days. Follow-up urine culture.    DVT prophylaxis: SCDs Code Status: Full code. Family Communication: No family at bed side. Disposition Plan:   Status is: Inpatient  Remains inpatient appropriate because:Inpatient level of care appropriate due to severity of illness  Dispo: The patient is from: Home              Anticipated d/c is to: Home              Patient currently is not medically stable to d/c.   Difficult to place patient No    Consultants:  Psychiatry  Procedures: None Antimicrobials:   Anti-infectives (From admission, onward)    Start     Dose/Rate Route Frequency Ordered Stop   09/03/20 1000  cefTRIAXone (ROCEPHIN) 1 g in sodium chloride 0.9 % 100 mL IVPB        1 g 200 mL/hr over 30  Minutes Intravenous Every 24 hours 09/03/20 0803 09/06/20 0959        Subjective: Patient was seen and examined in the hallway in the ED.  Overnight events noted.   Patient denies any suicidal /  homicidal ideations at present.  She reports feeling much improved. Blood pressure has improved.  She reports had  slept very good last night.   Objective: Vitals:   09/03/20 0530 09/03/20 2100 09/03/20 2251 09/04/20 0408  BP: (!) 147/67 (!) 176/69 (!) 123/48 (!) 167/67  Pulse: 62 (!) 55 (!) 55 (!) 59  Resp: '17 18  17  '$ Temp: 99.7 F (37.6 C) 97.8 F (36.6 C)  98.2 F (36.8 C)  TempSrc: Oral     SpO2: 100% 100%  99%  Weight:      Height:        Intake/Output Summary (Last 24 hours) at 09/04/2020 1219 Last data filed at 09/04/2020 0554 Gross per 24 hour  Intake 2210.6 ml  Output --  Net 2210.6 ml   Filed Weights   09/02/20 1300  Weight: 74 kg    Examination:  General exam: Appears comfortable and cheerful, not in any acute distress.  deconditioned Respiratory system: Clear to auscultation. Respiratory effort normal. Cardiovascular system: S1 & S2 heard, RRR. No JVD, murmurs, rubs, gallops or clicks. No pedal edema. Gastrointestinal system: Abdomen is nondistended, soft and nontender. No organomegaly or masses felt. Normal bowel sounds heard. Central nervous system: Alert and oriented. No focal neurological deficits. Extremities: No edema, no cyanosis, no clubbing. Skin: No rashes, lesions or ulcers Psychiatry: Judgement and insight appear normal. Mood & affect appropriate.     Data Reviewed: I have personally reviewed following labs and imaging studies  CBC: Recent Labs  Lab 09/01/20 2050 09/02/20 0440 09/03/20 0521  WBC 6.9 7.9 8.8  NEUTROABS 5.1  --   --   HGB 10.3* 9.5* 9.5*  HCT 31.7* 27.7* 29.1*  MCV 91.1 89.6 90.9  PLT 262 236 [id number]   Basic Metabolic Panel: Recent Labs  Lab 09/01/20 2050 09/02/20 0440 09/03/20 0521 09/04/20 0405  NA 139 136 135 138  K 4.5 4.7 4.1 4.1  CL 108 106 107 110  CO2 '24 24 23 23  '$ GLUCOSE 191* 206* 165* 122*  BUN 33* 31* 30* 29*  CREATININE 3.60* 2.94* 3.43* 3.00*  CALCIUM 9.5 8.9 9.0 8.6*  MG  --  2.1  --   --   PHOS  --  2.9  --   --    GFR: Estimated Creatinine Clearance: 20.3 mL/min (A) (by C-G formula based on SCr of 3  mg/dL (H)). Liver Function Tests: Recent Labs  Lab 09/01/20 2050 09/02/20 0440  AST 15 28  ALT 14 19  ALKPHOS 78 60  BILITOT 0.7 1.0  PROT 7.7 6.6  ALBUMIN 4.0 3.2*   No results for input(s): LIPASE, AMYLASE in the last 168 hours. No results for input(s): AMMONIA in the last 168 hours. Coagulation Profile: No results for input(s): INR, PROTIME in the last 168 hours. Cardiac Enzymes: No results for input(s): CKTOTAL, CKMB, CKMBINDEX, TROPONINI in the last 168 hours. BNP (last 3 results) No results for input(s): PROBNP in the last 8760 hours. HbA1C: Recent Labs    09/01/20 2050  HGBA1C 8.2*   CBG: Recent Labs  Lab 09/03/20 1202 09/03/20 1637 09/03/20 2056 09/04/20 0753 09/04/20 1203  GLUCAP 171* 156* 107* 115* 139*   Lipid Profile: No results for input(s): CHOL, HDL, LDLCALC, TRIG, CHOLHDL, LDLDIRECT in the  last 72 hours. Thyroid Function Tests: No results for input(s): TSH, T4TOTAL, FREET4, T3FREE, THYROIDAB in the last 72 hours. Anemia Panel: No results for input(s): VITAMINB12, FOLATE, FERRITIN, TIBC, IRON, RETICCTPCT in the last 72 hours. Sepsis Labs: No results for input(s): PROCALCITON, LATICACIDVEN in the last 168 hours.  Recent Results (from the past 240 hour(s))  Resp Panel by RT-PCR (Flu A&B, Covid) Nasopharyngeal Swab     Status: None   Collection Time: 09/01/20  7:04 PM   Specimen: Nasopharyngeal Swab; Nasopharyngeal(NP) swabs in vial transport medium  Result Value Ref Range Status   SARS Coronavirus 2 by RT PCR NEGATIVE NEGATIVE Final    Comment: (NOTE) SARS-CoV-2 target nucleic acids are NOT DETECTED.  The SARS-CoV-2 RNA is generally detectable in upper respiratory specimens during the acute phase of infection. The lowest concentration of SARS-CoV-2 viral copies this assay can detect is 138 copies/mL. A negative result does not preclude SARS-Cov-2 infection and should not be used as the sole basis for treatment or other patient management  decisions. A negative result may occur with  improper specimen collection/handling, submission of specimen other than nasopharyngeal swab, presence of viral mutation(s) within the areas targeted by this assay, and inadequate number of viral copies(<138 copies/mL). A negative result must be combined with clinical observations, patient history, and epidemiological information. The expected result is Negative.  Fact Sheet for Patients:  EntrepreneurPulse.com.au  Fact Sheet for Healthcare Providers:  IncredibleEmployment.be  This test is no t yet approved or cleared by the [place] FDA and  has been authorized for detection and/or diagnosis of SARS-CoV-2 by FDA under an Emergency Use Authorization (EUA). This EUA will remain  in effect (meaning this test can be used) for the duration of the COVID-19 declaration under Section 564(b)(1) of the Act, 21 U.S.C.section 360bbb-3(b)(1), unless the authorization is terminated  or revoked sooner.       Influenza A by PCR NEGATIVE NEGATIVE Final   Influenza B by PCR NEGATIVE NEGATIVE Final    Comment: (NOTE) The Xpert Xpress SARS-CoV-2/FLU/RSV plus assay is intended as an aid in the diagnosis of influenza from Nasopharyngeal swab specimens and should not be used as a sole basis for treatment. Nasal washings and aspirates are unacceptable for Xpert Xpress SARS-CoV-2/FLU/RSV testing.  Fact Sheet for Patients: EntrepreneurPulse.com.au  Fact Sheet for Healthcare Providers: IncredibleEmployment.be  This test is not yet approved or cleared by the [place] FDA and has been authorized for detection and/or diagnosis of SARS-CoV-2 by FDA under an Emergency Use Authorization (EUA). This EUA will remain in effect (meaning this test can be used) for the duration of the COVID-19 declaration under Section 564(b)(1) of the Act, 21 U.S.C. section 360bbb-3(b)(1), unless the  authorization is terminated or revoked.  Performed at [facility], [place] [address] [place], [place] [address]     Radiology Studies: No results found.  Scheduled Meds:  amLODipine  10 mg Oral Daily   busPIRone  5 mg Oral BID   carvedilol  3.125 mg Oral BID WC   insulin aspart  0-9 Units Subcutaneous TID WC   QUEtiapine  100 mg Oral QHS   sertraline  25 mg Oral Daily   Continuous Infusions:  sodium chloride 100 mL/hr at 09/04/20 0723   cefTRIAXone (ROCEPHIN)  IV 1 g (09/04/20 0857)     LOS: 1 day    Time spent: 25 mins    Keshauna Degraffenreid, MD [facility]   If 7PM-7AM, please contact night-coverage

## 2020-09-05 DIAGNOSIS — R45851 Suicidal ideations: Secondary | ICD-10-CM

## 2020-09-05 LAB — BASIC METABOLIC PANEL
Anion gap: 4 — ABNORMAL LOW (ref 5–15)
BUN: 29 mg/dL — ABNORMAL HIGH (ref 6–20)
CO2: 24 mmol/L (ref 22–32)
Calcium: 8.5 mg/dL — ABNORMAL LOW (ref 8.9–10.3)
Chloride: 112 mmol/L — ABNORMAL HIGH (ref 98–111)
Creatinine, Ser: 2.81 mg/dL — ABNORMAL HIGH (ref 0.44–1.00)
GFR, Estimated: 19 mL/min — ABNORMAL LOW (ref >60)
Glucose, Bld: 151 mg/dL — ABNORMAL HIGH (ref 70–99)
Potassium: 4.4 mmol/L (ref 3.5–5.1)
Sodium: 140 mmol/L (ref 135–145)

## 2020-09-05 LAB — GLUCOSE, CAPILLARY
Glucose-Capillary: 140 mg/dL — ABNORMAL HIGH (ref 70–99)
Glucose-Capillary: 129 mg/dL — ABNORMAL HIGH (ref 70–99)

## 2020-09-05 MED ORDER — AMLODIPINE BESYLATE 10 MG PO TABS: 10.0000 mg | ORAL_TABLET | Freq: Every day | ORAL | 1 refills | Status: DC

## 2020-09-05 MED ORDER — QUETIAPINE FUMARATE 100 MG PO TABS: 100.0000 mg | ORAL_TABLET | Freq: Every day | ORAL | 1 refills | Status: DC

## 2020-09-05 MED ORDER — SERTRALINE HCL 50 MG PO TABS
50.0000 mg | ORAL_TABLET | Freq: Every day | ORAL | 1 refills | Status: DC
Start: 2020-09-06 — End: 2020-11-26

## 2020-09-05 MED ORDER — INSULIN GLARGINE 100 UNIT/ML ~~LOC~~ SOLN: 15.0000 [IU] | Freq: Every day | SUBCUTANEOUS | 1 refills | Status: DC

## 2020-09-05 MED ORDER — BUSPIRONE HCL 5 MG PO TABS: 5.0000 mg | ORAL_TABLET | Freq: Two times a day (BID) | ORAL | 1 refills | Status: DC

## 2020-09-05 MED ORDER — HYDRALAZINE HCL 25 MG PO TABS
25.0000 mg | ORAL_TABLET | Freq: Two times a day (BID) | ORAL | Status: DC
  Administered 2020-09-05: 25 mg via ORAL
  Filled 2020-09-05: qty 1

## 2020-09-05 MED ORDER — CARVEDILOL 3.125 MG PO TABS: 3.1250 mg | ORAL_TABLET | Freq: Two times a day (BID) | ORAL | 1 refills | Status: DC

## 2020-09-05 MED ORDER — GLIPIZIDE 10 MG PO TABS: 10.0000 mg | ORAL_TABLET | Freq: Two times a day (BID) | ORAL | 1 refills | Status: DC

## 2020-09-05 MED ORDER — GABAPENTIN 100 MG PO CAPS
100.0000 mg | ORAL_CAPSULE | Freq: Three times a day (TID) | ORAL | 2 refills | Status: DC
Start: 2020-09-05 — End: 2020-11-26

## 2020-09-05 MED ORDER — HYDRALAZINE HCL 25 MG PO TABS: 25.0000 mg | ORAL_TABLET | Freq: Three times a day (TID) | ORAL | 1 refills | Status: DC

## 2020-09-05 NOTE — Plan of Care (Signed)
Instructions were reviewed with patient. All questions were answered. Patient was transported to main entrance by wheelchair. ° °

## 2020-09-05 NOTE — Discharge Summary (Signed)
Physician Discharge Summary  Lorraine Turner JZP:[id number] DOB: 07/14/1960 DOA: 09/01/2020  PCP: [name], MD  Admit date: 09/01/2020.  Discharge date: 09/05/2020.  Admitted From: Home.  Disposition:  Home.  Recommendations for Outpatient Follow-up:  Follow up with PCP in 1-2 weeks. Please obtain BMP/CBC in one week. Advised to follow-up with psychiatrist as scheduled. Advised to continue amlodipine 10 mg daily, Coreg 3.125 twice daily, hydralazine 25 mg  3 times daily. Advised to discontinue lisinopril given worsening renal functions.  Home Health:None Equipment/Devices:None  Discharge Condition: Stable CODE STATUS:Full code Diet recommendation: Heart Healthy   Brief [facility] course: This 60 years old female with a PMH significant for hypertension, depression, type 2 diabetes, dyslipidemia, schizoaffective disorder, CKD stage IIIb presented in the ED with suicidal ideations.  Patient was initially found by police, standing in the middle of traffic, trying to get hit by a car.  Patient has been depressed for last few months after becoming homeless. She has been off of her regular medications due to inability to pay.  Patient also has psychiatric illness previously on psych meds but again she has been off of for a few months.  She has a history of suicidal ideations in the past ,  She was evaluated by psych inpatient in August 2020. Patient was found to be hypertensive in the ED, systolic blood pressure 480/16.  Patient was admitted for suicidal ideations and hypertensive urgency.  Patient was resumed on her blood pressure medications.  Patient was started on one-to-one observation , She was evaluated by psychiatrist.  She denies any suicidal/ homicidal ideations at present.  Patient was resumed on her antidepressant medications, she was started on BuSpar, Zoloft and Seroquel. Patient blood pressure medications were adjusted, blood pressure has improved.  Patient's renal  functions has shown significant improvement.  Serum creatinine close to baseline.  Patient has ambulated well,  denies any other concerns.  Patient feels better and want to be discharged.  Patient is being discharged home, She was provided resources for psych follow-up.   She was managed for below problems.   Discharge Diagnoses:  Principal Problem:   Suicide ideation Active Problems:   Primary hypertension   Schizoaffective disorder, depressive type ([place])   Suicide attempt ([place])   Type 2 diabetes mellitus with hyperglycemia ([place])   AKI (acute kidney injury) ([place])   Hypertensive urgency   CKD (chronic kidney disease), stage III ([place])  Suicidal ideations : Patient presented in the ED after trying to commit suicide by getting hit by car. Patient does have history of depression, schizoaffective disorder , prior suicidal ideations. recent stressors being homeless, financial circumstances. She ran out of her antidepressant medications. Resume Home medications. Psychiatry consulted.  Medications adjusted.  Patient is not suicidal. Discontinue suicidal precautions.    Hypertensive urgency > Improving She was found to have systolic BP greater than 553. Patient has been noncompliant with blood pressure medications. She used to be on lisinopril, Norvasc, Coreg.  Has not taken medication in few months. Continue Norvasc and Coreg. Hold lisinopril given AKI. Hydralazine 25 mg 3 times daily added for better blood pressure control.   Type 2 diabetes: Obtain hemoglobin A1c, ADA diet. Continue regular insulin sliding scale. Diabetic coordinator consult.   AKI on CKD stage IIIb: Patient does have CKD which has been progressive. Baseline serum creatinine 1.9-2.3, presented with serum creatinine of 3.6. Renal ultrasound : Chronic medical disease. Avoid nephrotoxic medications. Continue gentle IV hydration. Continue to monitor serum creatinine 3.60 >2.92 >3.42>  3.0 >2.8   History of  depression, schizoaffective disorder Psych. Consulted, adjusted medications. Continue BuSpar and Seroquel, venlafaxine discontinued Continue Zoloft 25 mg daily.   UTI: Completed ceftriaxone for 3 days,  denies any further urinary symptoms    Discharge Instructions  Discharge Instructions     Call MD for:  difficulty breathing, headache or visual disturbances   Complete by: As directed    Call MD for:  persistant dizziness or light-headedness   Complete by: As directed    Call MD for:  persistant nausea and vomiting   Complete by: As directed    Diet - low sodium heart healthy   Complete by: As directed    Diet Carb Modified   Complete by: As directed    Discharge instructions   Complete by: As directed    Advised to follow-up with primary care physician in 1 week. Advised to follow-up with psychiatrist as scheduled. Advised to continue amlodipine 10 mg daily, Coreg 3.125 twice daily, hydralazine 3 times daily. Advised to discontinue lisinopril given worsening renal functions.   Increase activity slowly   Complete by: As directed       Allergies as of 09/05/2020   No Known Allergies      Medication List     STOP taking these medications    atorvastatin 40 MG tablet Commonly known as: LIPITOR   bacitracin ointment   cephALEXin 500 MG capsule Commonly known as: KEFLEX   lisinopril 40 MG tablet Commonly known as: ZESTRIL   loperamide 2 MG capsule Commonly known as: IMODIUM   ondansetron 4 MG disintegrating tablet Commonly known as: Zofran ODT   venlafaxine XR 75 MG 24 hr capsule Commonly known as: Effexor XR       TAKE these medications    amLODipine 10 MG tablet Commonly known as: NORVASC Take 1 tablet (10 mg total) by mouth daily. Start taking on: September 06, 2020 What changed:  medication strength how much to take   busPIRone 5 MG tablet Commonly known as: BUSPAR Take 1 tablet (5 mg total) by mouth 2 (two) times daily.   carvedilol 3.125 MG  tablet Commonly known as: COREG Take 1 tablet (3.125 mg total) by mouth 2 (two) times daily with a meal.   gabapentin 100 MG capsule Commonly known as: Neurontin Take 1 capsule (100 mg total) by mouth 3 (three) times daily.   glipiZIDE 10 MG tablet Commonly known as: GLUCOTROL Take 1 tablet (10 mg total) by mouth 2 (two) times daily before a meal.   glucose blood test strip Use as instructed   hydrALAZINE 25 MG tablet Commonly known as: APRESOLINE Take 1 tablet (25 mg total) by mouth 3 (three) times daily.   insulin glargine 100 UNIT/ML injection Commonly known as: Lantus Inject 0.15 mLs (15 Units total) into the skin at bedtime.   Insulin Syringe-Needle U-100 30G X 5/16" 0.5 ML Misc Commonly known as: Safety Insulin Syringes 1 each by Does not apply route at bedtime.   QUEtiapine 100 MG tablet Commonly known as: SEROQUEL Take 1 tablet (100 mg total) by mouth at bedtime. For mood control   sertraline 50 MG tablet Commonly known as: ZOLOFT Take 1 tablet (50 mg total) by mouth daily. Start taking on: September 06, 2020   True Metrix Meter w/Device Kit 1 Device by Does not apply route 3 (three) times daily. For blood sugar checks   TRUEplus Lancets 28G Misc 1 each by Does not apply route 3 (three) times daily. For  blood sugar checks        Follow-up Information     [name], MD Follow up in 1 week(s).   Specialty: Family Medicine Contact information: [place] [id number] [place] Follow up in 1 week(s).   Contact information: [place] [id number]               No Known Allergies  Consultations: Psychiatry   Procedures/Studies: US RENAL  Result Date: 09/01/2020 CLINICAL DATA:  Acute kidney injury superimposed on chronic kidney disease. EXAM: RENAL / URINARY TRACT ULTRASOUND COMPLETE COMPARISON:  Noncontrast CT 09/17/2017 FINDINGS: Right  Kidney: Renal measurements: 9.1 x 4.0 x 4.1 cm = volume: 76 mL. Borderline increased parenchymal echogenicity. No hydronephrosis. No focal lesion or stone. Left Kidney: Renal measurements: 9.4 x 5.4 x 4.6 cm = volume: 124 mL. No hydronephrosis. Normal parenchymal echogenicity. No visualized lesion or stone. Bladder: Appears normal for degree of bladder distention. Both ureteral jets are seen. Other: None. IMPRESSION: 1. No obstructive uropathy. 2. Mild increased right renal parenchymal echogenicity consistent with chronic medical renal disease. No focal renal abnormality or stone. Electronically Signed   By: Keith Rake M.D.   On: 09/01/2020 23:13     Subjective: Patient was seen and examined at bedside.  Overnight events noted.  Patient reports feeling much improved.  Blood pressure is improving,  She feels better and wants to be discharged.  She denies any suicidal homicidal ideations at present.  Discharge Exam: Vitals:   09/04/20 2009 09/05/20 0605  BP: (!) 169/67 (!) 171/57  Pulse: 63 (!) 58  Resp: 18 18  Temp: 97.9 F (36.6 C) 97.8 F (36.6 C)  SpO2: 100% 100%   Vitals:   09/04/20 0408 09/04/20 1421 09/04/20 2009 09/05/20 0605  BP: (!) 167/67 137/60 (!) 169/67 (!) 171/57  Pulse: (!) 59 62 63 (!) 58  Resp: '17 14 18 18  ' Temp: 98.2 F (36.8 C) 98.7 F (37.1 C) 97.9 F (36.6 C) 97.8 F (36.6 C)  TempSrc:  Oral  Oral  SpO2: 99% 98% 100% 100%  Weight:      Height:        General: Pt is alert, awake, not in acute distress Cardiovascular: RRR, S1/S2 +, no rubs, no gallops Respiratory: CTA bilaterally, no wheezing, no rhonchi Abdominal: Soft, NT, ND, bowel sounds + Extremities: no edema, no cyanosis    The results of significant diagnostics from this hospitalization (including imaging, microbiology, ancillary and laboratory) are listed below for reference.     Microbiology: Recent Results (from the past 240 hour(s))  Resp Panel by RT-PCR (Flu A&B, Covid) Nasopharyngeal  Swab     Status: None   Collection Time: 09/01/20  7:04 PM   Specimen: Nasopharyngeal Swab; Nasopharyngeal(NP) swabs in vial transport medium  Result Value Ref Range Status   SARS Coronavirus 2 by RT PCR NEGATIVE NEGATIVE Final    Comment: (NOTE) SARS-CoV-2 target nucleic acids are NOT DETECTED.  The SARS-CoV-2 RNA is generally detectable in upper respiratory specimens during the acute phase of infection. The lowest concentration of SARS-CoV-2 viral copies this assay can detect is 138 copies/mL. A negative result does not preclude SARS-Cov-2 infection and should not be used as the sole basis for treatment or other patient management decisions. A negative result may occur with  improper specimen collection/handling, submission of specimen other than nasopharyngeal swab, presence of viral mutation(s) within  the areas targeted by this assay, and inadequate number of viral copies(<138 copies/mL). A negative result must be combined with clinical observations, patient history, and epidemiological information. The expected result is Negative.  Fact Sheet for Patients:  EntrepreneurPulse.com.au  Fact Sheet for Healthcare Providers:  IncredibleEmployment.be  This test is no t yet approved or cleared by the [place] FDA and  has been authorized for detection and/or diagnosis of SARS-CoV-2 by FDA under an Emergency Use Authorization (EUA). This EUA will remain  in effect (meaning this test can be used) for the duration of the COVID-19 declaration under Section 564(b)(1) of the Act, 21 U.S.C.section 360bbb-3(b)(1), unless the authorization is terminated  or revoked sooner.       Influenza A by PCR NEGATIVE NEGATIVE Final   Influenza B by PCR NEGATIVE NEGATIVE Final    Comment: (NOTE) The Xpert Xpress SARS-CoV-2/FLU/RSV plus assay is intended as an aid in the diagnosis of influenza from Nasopharyngeal swab specimens and should not be used as a sole  basis for treatment. Nasal washings and aspirates are unacceptable for Xpert Xpress SARS-CoV-2/FLU/RSV testing.  Fact Sheet for Patients: EntrepreneurPulse.com.au  Fact Sheet for Healthcare Providers: IncredibleEmployment.be  This test is not yet approved or cleared by the [place] FDA and has been authorized for detection and/or diagnosis of SARS-CoV-2 by FDA under an Emergency Use Authorization (EUA). This EUA will remain in effect (meaning this test can be used) for the duration of the COVID-19 declaration under Section 564(b)(1) of the Act, 21 U.S.C. section 360bbb-3(b)(1), unless the authorization is terminated or revoked.  Performed at [facility], [place] [address] [place], [place] [address]      Labs: BNP (last 3 results) No results for input(s): BNP in the last 8760 hours. Basic Metabolic Panel: Recent Labs  Lab 09/01/20 2050 09/02/20 0440 09/03/20 0521 09/04/20 0405 09/05/20 0443  NA 139 136 135 138 140  K 4.5 4.7 4.1 4.1 4.4  CL 108 106 107 110 112*  CO2 '24 24 23 23 24  ' GLUCOSE 191* 206* 165* 122* 151*  BUN 33* 31* 30* 29* 29*  CREATININE 3.60* 2.94* 3.43* 3.00* 2.81*  CALCIUM 9.5 8.9 9.0 8.6* 8.5*  MG  --  2.1  --   --   --   PHOS  --  2.9  --   --   --    Liver Function Tests: Recent Labs  Lab 09/01/20 2050 09/02/20 0440  AST 15 28  ALT 14 19  ALKPHOS 78 60  BILITOT 0.7 1.0  PROT 7.7 6.6  ALBUMIN 4.0 3.2*   No results for input(s): LIPASE, AMYLASE in the last 168 hours. No results for input(s): AMMONIA in the last 168 hours. CBC: Recent Labs  Lab 09/01/20 2050 09/02/20 0440 09/03/20 0521  WBC 6.9 7.9 8.8  NEUTROABS 5.1  --   --   HGB 10.3* 9.5* 9.5*  HCT 31.7* 27.7* 29.1*  MCV 91.1 89.6 90.9  PLT 262 236 224   Cardiac Enzymes: No results for input(s): CKTOTAL, CKMB, CKMBINDEX, TROPONINI in the last 168 hours. BNP: Invalid input(s): POCBNP CBG: Recent Labs  Lab  09/04/20 1203 09/04/20 1703 09/04/20 2119 09/05/20 0709 09/05/20 1102  GLUCAP 139* 149* 185* 140* 129*   D-Dimer No results for input(s): DDIMER in the last 72 hours. Hgb A1c No results for input(s): HGBA1C in the last 72 hours. Lipid Profile No results for input(s): CHOL, HDL, LDLCALC, TRIG, CHOLHDL, LDLDIRECT in the last 72 hours. Thyroid function  studies No results for input(s): TSH, T4TOTAL, T3FREE, THYROIDAB in the last 72 hours.  Invalid input(s): FREET3 Anemia work up No results for input(s): VITAMINB12, FOLATE, FERRITIN, TIBC, IRON, RETICCTPCT in the last 72 hours. Urinalysis    Component Value Date/Time   COLORURINE YELLOW 09/01/2020 1904   APPEARANCEUR CLOUDY (A) 09/01/2020 1904   LABSPEC 1.013 09/01/2020 1904   PHURINE 6.0 09/01/2020 1904   GLUCOSEU NEGATIVE 09/01/2020 1904   HGBUR NEGATIVE 09/01/2020 1904   HGBUR negative 10/12/2008 1103   BILIRUBINUR NEGATIVE 09/01/2020 1904   BILIRUBINUR negative 10/21/2019 1447   BILIRUBINUR negative 03/10/2017 1459   KETONESUR NEGATIVE 09/01/2020 1904   PROTEINUR >=300 (A) 09/01/2020 1904   UROBILINOGEN 0.2 10/21/2019 1447   UROBILINOGEN 0.2 09/30/2019 1151   NITRITE NEGATIVE 09/01/2020 1904   LEUKOCYTESUR LARGE (A) 09/01/2020 1904   Sepsis Labs Invalid input(s): PROCALCITONIN,  WBC,  LACTICIDVEN Microbiology Recent Results (from the past 240 hour(s))  Resp Panel by RT-PCR (Flu A&B, Covid) Nasopharyngeal Swab     Status: None   Collection Time: 09/01/20  7:04 PM   Specimen: Nasopharyngeal Swab; Nasopharyngeal(NP) swabs in vial transport medium  Result Value Ref Range Status   SARS Coronavirus 2 by RT PCR NEGATIVE NEGATIVE Final    Comment: (NOTE) SARS-CoV-2 target nucleic acids are NOT DETECTED.  The SARS-CoV-2 RNA is generally detectable in upper respiratory specimens during the acute phase of infection. The lowest concentration of SARS-CoV-2 viral copies this assay can detect is 138 copies/mL. A negative result  does not preclude SARS-Cov-2 infection and should not be used as the sole basis for treatment or other patient management decisions. A negative result may occur with  improper specimen collection/handling, submission of specimen other than nasopharyngeal swab, presence of viral mutation(s) within the areas targeted by this assay, and inadequate number of viral copies(<138 copies/mL). A negative result must be combined with clinical observations, patient history, and epidemiological information. The expected result is Negative.  Fact Sheet for Patients:  EntrepreneurPulse.com.au  Fact Sheet for Healthcare Providers:  IncredibleEmployment.be  This test is no t yet approved or cleared by the [place] FDA and  has been authorized for detection and/or diagnosis of SARS-CoV-2 by FDA under an Emergency Use Authorization (EUA). This EUA will remain  in effect (meaning this test can be used) for the duration of the COVID-19 declaration under Section 564(b)(1) of the Act, 21 U.S.C.section 360bbb-3(b)(1), unless the authorization is terminated  or revoked sooner.       Influenza A by PCR NEGATIVE NEGATIVE Final   Influenza B by PCR NEGATIVE NEGATIVE Final    Comment: (NOTE) The Xpert Xpress SARS-CoV-2/FLU/RSV plus assay is intended as an aid in the diagnosis of influenza from Nasopharyngeal swab specimens and should not be used as a sole basis for treatment. Nasal washings and aspirates are unacceptable for Xpert Xpress SARS-CoV-2/FLU/RSV testing.  Fact Sheet for Patients: EntrepreneurPulse.com.au  Fact Sheet for Healthcare Providers: IncredibleEmployment.be  This test is not yet approved or cleared by the [place] FDA and has been authorized for detection and/or diagnosis of SARS-CoV-2 by FDA under an Emergency Use Authorization (EUA). This EUA will remain in effect (meaning this test can be used) for  the duration of the COVID-19 declaration under Section 564(b)(1) of the Act, 21 U.S.C. section 360bbb-3(b)(1), unless the authorization is terminated or revoked.  Performed at [facility], [place] [address], [place], [place] [address]      Time coordinating discharge: Over 30 minutes  SIGNED:  Shawna Clamp, MD  [facility] 09/05/2020, 2:02 PM Pager   If 7PM-7AM, please contact night-coverage [email or web address]

## 2020-09-05 NOTE — Discharge Instructions (Signed)
Advised to follow-up with psychiatrist as scheduled. Advised to continue amlodipine 10 mg daily, Coreg 3.125 twice daily, hydralazine 3 times daily. Advised to discontinue lisinopril given worsening renal functions.

## 2020-09-05 NOTE — TOC Transition Note (Addendum)
Transition of Care [facility]) - CM/SW Discharge Note  Patient Details  Name: RIGBY SWAMY MRN: [id number] Date of Birth: 01/13/1961  Transition of Care [facility]) CM/SW Contact:  Sherie Don, LCSW Phone Number: 09/05/2020, 11:05 AM  Clinical Narrative: Patient in need of homeless shelter list and outpatient behavioral health resources. CSW met with patient and provided patient with shelter list as well as the contact information for [facility]. CSW explained patient will need to call the shelters and call to schedule a behavioral health appointment. Patient verbalized understanding. TOC signing off.  Addendum: Patient will need a ride home. Patient signed rider waiver form, which was sent to [employer]. RN to call for transport when patient is ready.  Final next level of care: Home/Self Care Barriers to Discharge: Barriers Resolved  Patient Goals and CMS Choice Choice offered to / list presented to : NA  Discharge Plan and Services         DME Arranged: N/A DME Agency: NA  Readmission Risk Interventions No flowsheet data found.

## 2020-09-06 ENCOUNTER — Telehealth: Payer: Self-pay

## 2020-09-06 NOTE — Telephone Encounter (Signed)
Transition Care Management Unsuccessful Follow-up Telephone Call  Date of discharge and from where:  [facility] on 09/05/2020  Attempts:  1st Attempt  Reason for unsuccessful TCM follow-up call:  Unable to reach patient on both listed phone numbers or leave voice message at this time.   Pt needs to schedule HFU appt with PCP

## 2020-09-07 ENCOUNTER — Telehealth: Payer: Self-pay

## 2020-09-07 ENCOUNTER — Telehealth (INDEPENDENT_AMBULATORY_CARE_PROVIDER_SITE_OTHER): Payer: Self-pay

## 2020-09-07 NOTE — Telephone Encounter (Signed)
Copied from [place] [phone]. Topic: General - Other >> Sep 07, 2020 11:44 AM Tessa Lerner A wrote: Reason for CRM: Patient would like to speak with staff member Opal Sidles when possible   The patient has concerns related to coordination of services and would like to know more about their eligibility for Meals on Wheels   Please contact when possible

## 2020-09-07 NOTE — Telephone Encounter (Signed)
Referral sent to [facility]

## 2020-09-07 NOTE — Telephone Encounter (Signed)
Transition Care Management Follow-up Telephone Call Date of discharge and from where: 09/05/2020, [facility] How have you been since you were released from the hospital? She said she is physically feeling okay but is very depressed. The patient denied any suicidal ideation and stated that she was feeling better by the end of the call.  Lorraine McCoy, LCSW [place] joined this call to speak with patient and the patient scheduled an appointment to meet with Lorraine 09/18/2020.  Any questions or concerns? Yes- she said that she is homeless. She then described her current living situation. She said she had been living in her home for 15-16 years with her husband, he died in 2016/05/08 and she has not been able to keep up with paying the bills/mortgage. Her son had provided some financial support but he has been incarcerated. She has children in [facility] [facility] [place] [facility] but does not want to live there and she has a daughter in [place] who has children and is also homeless.  She said the house she has been living in has been foreclosed/auctioned/sold  but she is not sure who bought it and she still lives there even though she knows she should not be there.  The utilities have been cut off -  no running water or electricity.  There are rats, roaches, mice in the house and she sleeps on a bench outside on [address] at night. She has been contacting local shelters and is placed on multiple waiting lists.  Provided her with the phone number for [facility] and instructions how to be put on wait list for [employer].  Explained to her that [facility] works with Scientist, research (physical sciences) [employer] [facility] [place].  There is no guarantee that they can provide any assistance for her in her situation but she was in agreement to placing a referral. This CM then sent referral to Novella Olive, Attorney/LANC. She has been using the showers at [facility] She has no income.  She said she panhandles as needed She applied for disability years ago and was denied. She was  agreeable to placing a referral to [facility] for assistance with submitting a disability application.  She does receive food stamps.  She also explained that at some point in time she would like to return to school.  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  Items Reviewed: Did the pt receive and understand the discharge instructions provided? She needs to review the instructions. Medications obtained and verified? No She said that she does not have any of her medications.  Instructed her to review discharge instructions and medication list and contact her pharmacy to confirm medications are ready for pick up. Prescriptions for all of her medications were sent to her [facility].  Instructed her to call this clinic if she has additional questions. She also needs a glucometer.  Any new allergies since your discharge? No  Dietary orders reviewed? No Do you have support at home? No   Home Care and Equipment/Supplies: Were home health services ordered? no If so, what is the name of the agency? N/a  Has the agency set up a time to come to the patient's home? not applicable Were any new equipment or medical supplies ordered?  No What is the name of the medical supply agency? N/a Were you able to get the supplies/equipment? not applicable Do you have any questions related to the use of the equipment or supplies? No  Functional Questionnaire: (I = Independent  and D = Dependent) ADLs: independent   Follow up appointments reviewed:  PCP Hospital f/u appt confirmed? Yes  Scheduled to see Dr Margarita Rana  on 09/26/2020 @ 1050. [facility] f/u appt confirmed?  None scheduled.  She needs to follow up with behavioral health  She has received services from [facility] and [facility] in the past.   Are transportation arrangements needed?  She can call Medicaid to register for transportation to medical appointments or [facility]  Transportation can be arranged if needed . She was provided with the phone number for DSS.  If their condition worsens, is the pt aware to call PCP or go to the Emergency Dept.? Yes Was the patient provided with contact information for the PCP's office or ED? Yes Was to pt encouraged to call back with questions or concerns? Yes

## 2020-09-07 NOTE — Telephone Encounter (Signed)
Call returned to patient. She said she was wondering if she would qualify for Meals On Wheels.  Explained to her that she can call [facility] [place] and put her name on the wait list. She was frustrated that there is a wait list.    This CM then explained to her about the program - [facility] [employer] [facility].  They provide free groceries to individuals in this community in addition to nutrition education.  She was concerned that because she does not have utilities where she is living,  the food would go bad.  This CM encouraged her to speak with co-ordinator from [facility]  - Nutrition program about her living situation and see how they can best assist her. She was in agreement to having this CM make a referral for her and the referral was sent to Manuela Schwartz Cox/ [facility]- [employer] and Nutrition Program.   The patient said she would hold off on contacting [employer] until she learns more about [employer].

## 2020-09-08 NOTE — Telephone Encounter (Signed)
Joined call with Eden Lathe, RN Care Manager and scheduled appt with pt for 09/18/20. Pt is dealing with severe homelessness which has led to depression and a hx of suicide attempt. Denies any current SI. Mentioned that she has limited support due to her daughter being homeless and her other children living in [place]. Pt does not want to stay with her children. LCSW provided information on resources in the community on housing and encouraged pt to contact shelters to get on waitlist. LCSW will fu with pt and provide further support.

## 2020-09-18 ENCOUNTER — Other Ambulatory Visit: Payer: Self-pay

## 2020-09-18 ENCOUNTER — Ambulatory Visit: Payer: Medicaid Other | Attending: Family Medicine | Admitting: Clinical

## 2020-09-18 DIAGNOSIS — Z8659 Personal history of other mental and behavioral disorders: Secondary | ICD-10-CM

## 2020-09-18 DIAGNOSIS — Z5900 Homelessness unspecified: Secondary | ICD-10-CM

## 2020-09-18 DIAGNOSIS — F333 Major depressive disorder, recurrent, severe with psychotic symptoms: Secondary | ICD-10-CM | POA: Diagnosis not present

## 2020-09-19 ENCOUNTER — Telehealth: Payer: Self-pay

## 2020-09-19 ENCOUNTER — Other Ambulatory Visit: Payer: Self-pay

## 2020-09-19 NOTE — Telephone Encounter (Signed)
This CM spoke to patient yesterday (09/18/2020) when she was in the clinic for an appointment. She had not picked any of her medications because she could not afford the $4.00 co-pays. She also said that she has no electricity in her home and has no way to keep the insulin cold.  Informed her that once the insulin vial /pen is open it doesn't have to be refrigerated, it can be left at room temperature for 28 days.    The issue of patient's home being extremely hot, because there  is no electricity, and keeping the open insulin at a high room temperature  was discussed with Benard Halsted, [place].  He had no other suggestions for patient at this time.  The patient said she is not able to store it at anyone else's home.   Informed her that she can have her medications transferred to [facility] or have [facility] pharmacist contact [facility] to address the issue with co-pays. She was in agreement to having the pharmacist contact [facility]. Explained to her that this CM would contact the next day to confirm where she would like to have prescriptions filled. Patient confirmed her phone number and said that she charges the phone at neighbor's house.    Attempted to contact the patient today # [phone] to inform her that per Tresa Res, [employer] , he confirmed with [facility] that they will waive the co-pays but the patient must request the co-pays be waived. The patient's phone rang but voicemail was not set up for this CM to leave a message.

## 2020-09-19 NOTE — Telephone Encounter (Signed)
Call placed to patient and informed her that per Tresa Res, [place] ,[facility]  will waive the co-pays but she must request the co-pays be waived. She said she would pick them up tomorrow. Instructed her to call this CM back if there are any problems getting the medications.

## 2020-09-20 NOTE — BH Specialist Note (Signed)
Integrated Behavioral Health Initial In-Person Visit  MRN: [id number] Name: Lorraine Turner  Number of [place] Clinician visits:: 1/6 Session Start time: 3:40 PM  Session End time: 4:40 PM Total time: 60 minutes  Types of Service: Individual psychotherapy  Interpretor:No. Interpretor Name and Language: N/A   Warm Hand Off Completed.         Subjective: Lorraine Turner is a 60 y.o. female accompanied by  self Patient was referred by PCP Margarita Rana and [place] manager for mood, hx of suicide attempt, and homelessness. Patient reports the following symptoms/concerns: Reports that she feels depressed, anxious, worries excessively, difficulty relaxing, difficulty concentrating, restlessness, and irritability. Reports that she is experiencing housing problems. Reports that her home was foreclosed on several months ago and she continues to stay there without electricity or water. Reports that she learned that her house was sold but has not been contacted so she continues to stay there. Reports that her spouse passed away four years ago. Reports that her son was supporting her financially until he was arrested and placed in prison. Reports being diagnosed with schizophrenia, bipolar disorder, and OCD in the past. Denies auditory/visual hallucinations. Duration of problem: 4+ years; Severity of problem: severe  Objective: Mood: Anxious, Depressed, and Hopeless and Affect: Appropriate and Tearful Risk of harm to self or others: No plan to harm self or others  Life Context: Family and Social: Reports that her home was foreclosed on several months ago and she continues to stay there without electricity or water. Reports that she learned that her house was sold but has not been contacted so she continues to stay there. Reports that she has five children. Reports two of them stay in [place] with their partners, one of her son's is in prison, and another son and daughter live  in [place].  School/Work: Pt is currently unemployed and not receiving income. Pt has applied for disability in the past but was denied. Pt has been referred to legal aid by CM. Pt panhandles for income. Self-Care: Denies hx of substance use. Reports using alcohol once every two weeks. Reports eating an ice cream cone from [employer] as self-care. Reports talking to her "female friend" as a Technical sales engineer.  Life Changes: Pt 60 spouse passed away in 17-May-2016 and he was the primary financial provider. Pt's son assisted with finances until he went to prison. Pt continues to live in her home without water and electricity until she is asked to leave due to the home being sold. Pt sleeps outside of her home at times due to her house being too hot. Pt has hx of schizophrenia, bipolar disorder, and OCD and has been involved in tx in the past. Pt reports that mental health has impacted her ability to work in the past.  Patient and/or Family's Strengths/Protective Factors: Social connections and Sense of purpose  Goals Addressed: Patient will: Reduce symptoms of: anxiety, depression, and stress Increase knowledge and/or ability of: coping skills and stress reduction  Demonstrate ability to: Increase healthy adjustment to current life circumstances and Increase adequate support systems for patient/family  Progress towards Goals: Ongoing  Interventions: Interventions utilized: Mindfulness or Psychologist, educational, CBT Cognitive Behavioral Therapy, Supportive Counseling, Psychoeducation and/or Health Education, and Link to [employer]  Standardized Assessments completed: GAD-7 and PHQ 9  Patient and/or Family Response: Pt was receptive to tx. Pt will begin utilizing deep breathing exercises and will be referred for medication management. Pt was assisted with cognitive processing skills.  Patient Centered Plan: Patient is on the following Treatment Plan(s):  Depression, anxiety, stress, and  homelessness  Assessment: Patient currently experiencing depression and anxiety related to homelessness. Pt appears to have experienced several years of mental health tx which appears to have impacted her abiltity to maintain housing and income. Pt appears to experience delusional thoughts; pt reports that her "female friend" is a Presenter, broadcasting, works for [employer], is a Tax adviser, and a Engineer, water. Pt appears to have minimal support and is unable to stay with children and her female friend. Pt has hx of suicide attempt though she denies SI today. Pt appears to have experienced trauma related to homelessness. Pt has been unable to obtain psychotropic medication due to being unable to afford it.    Patient may benefit from oupatient therapy and medication management. LCSW consulted with CM to assist with pt obtaining her medication.  LCSW encouraged pt to take medication upon obtaining it.  LCSW assisted pt with identifying positive affirmations to decrease negative thinking. LCSW provided psychoeducation on depression, anxiety and trauma in order to normalize pt symptoms. LCSW encouraged pt to utilize positive affirmations daily. LCSW provided housing information and encouraged pt to apply for housing authority. Pt was provided with food. LCSW also provided pt with crisis resources.  Plan: Follow up with behavioral health clinician on : 10/04/20 Behavioral recommendations: Utilize positive affirmations, deep breathing, and adhere to medications. Utilize provided crisis resources if SI arises with plan, means, and intent.  Referral(s): [place] (In Clinic), Community Resources:  Food and Housing, and Psychiatrist "From scale of 1-10, how likely are you to follow plan?": 10  Jden Want C Jamiesha Victoria, LCSW

## 2020-09-22 ENCOUNTER — Telehealth: Payer: Self-pay | Admitting: Clinical

## 2020-09-22 NOTE — Telephone Encounter (Signed)
Paitent called back I informed her of the call to contact her. She says she has received her meds and is good to go.

## 2020-09-25 NOTE — Telephone Encounter (Signed)
Pt called in stating she just missed called a from [place], and wanted to see if she could give her a call back. Please advise.

## 2020-09-25 NOTE — Telephone Encounter (Signed)
noted 

## 2020-09-26 ENCOUNTER — Ambulatory Visit: Payer: Medicaid Other | Attending: Family Medicine | Admitting: Family Medicine

## 2020-09-26 ENCOUNTER — Encounter: Payer: Self-pay | Admitting: Family Medicine

## 2020-09-26 ENCOUNTER — Encounter (HOSPITAL_COMMUNITY): Payer: Self-pay

## 2020-09-26 ENCOUNTER — Telehealth: Payer: Self-pay

## 2020-09-26 ENCOUNTER — Emergency Department (HOSPITAL_COMMUNITY)
Admission: EM | Admit: 2020-09-26 | Discharge: 2020-09-27 | Disposition: A | Discharge status: Home / Self Care | Payer: Medicaid Other | Attending: Student | Admitting: Student

## 2020-09-26 ENCOUNTER — Other Ambulatory Visit: Payer: Self-pay

## 2020-09-26 VITALS — BP 205/78 | HR 61 | Ht 65.0 in | Wt 139.2 lb

## 2020-09-26 DIAGNOSIS — Z046 Encounter for general psychiatric examination, requested by authority: Secondary | ICD-10-CM | POA: Diagnosis present

## 2020-09-26 DIAGNOSIS — Z5900 Homelessness unspecified: Secondary | ICD-10-CM | POA: Insufficient documentation

## 2020-09-26 DIAGNOSIS — E1140 Type 2 diabetes mellitus with diabetic neuropathy, unspecified: Secondary | ICD-10-CM | POA: Diagnosis not present

## 2020-09-26 DIAGNOSIS — Z9151 Personal history of suicidal behavior: Secondary | ICD-10-CM | POA: Insufficient documentation

## 2020-09-26 DIAGNOSIS — Y900 Blood alcohol level of less than 20 mg/100 ml: Secondary | ICD-10-CM | POA: Insufficient documentation

## 2020-09-26 DIAGNOSIS — Z9114 Patient's other noncompliance with medication regimen: Secondary | ICD-10-CM | POA: Diagnosis not present

## 2020-09-26 DIAGNOSIS — Z7984 Long term (current) use of oral hypoglycemic drugs: Secondary | ICD-10-CM | POA: Diagnosis not present

## 2020-09-26 DIAGNOSIS — N184 Chronic kidney disease, stage 4 (severe): Secondary | ICD-10-CM | POA: Insufficient documentation

## 2020-09-26 DIAGNOSIS — I10 Essential (primary) hypertension: Secondary | ICD-10-CM | POA: Diagnosis not present

## 2020-09-26 DIAGNOSIS — F333 Major depressive disorder, recurrent, severe with psychotic symptoms: Secondary | ICD-10-CM | POA: Insufficient documentation

## 2020-09-26 DIAGNOSIS — Z87891 Personal history of nicotine dependence: Secondary | ICD-10-CM | POA: Diagnosis not present

## 2020-09-26 DIAGNOSIS — Z833 Family history of diabetes mellitus: Secondary | ICD-10-CM | POA: Insufficient documentation

## 2020-09-26 DIAGNOSIS — E1122 Type 2 diabetes mellitus with diabetic chronic kidney disease: Secondary | ICD-10-CM | POA: Insufficient documentation

## 2020-09-26 DIAGNOSIS — Z8249 Family history of ischemic heart disease and other diseases of the circulatory system: Secondary | ICD-10-CM | POA: Diagnosis not present

## 2020-09-26 DIAGNOSIS — Z794 Long term (current) use of insulin: Secondary | ICD-10-CM | POA: Insufficient documentation

## 2020-09-26 DIAGNOSIS — Z79899 Other long term (current) drug therapy: Secondary | ICD-10-CM | POA: Insufficient documentation

## 2020-09-26 DIAGNOSIS — R45851 Suicidal ideations: Secondary | ICD-10-CM | POA: Insufficient documentation

## 2020-09-26 DIAGNOSIS — Z20822 Contact with and (suspected) exposure to covid-19: Secondary | ICD-10-CM | POA: Insufficient documentation

## 2020-09-26 DIAGNOSIS — I129 Hypertensive chronic kidney disease with stage 1 through stage 4 chronic kidney disease, or unspecified chronic kidney disease: Secondary | ICD-10-CM | POA: Diagnosis present

## 2020-09-26 DIAGNOSIS — F332 Major depressive disorder, recurrent severe without psychotic features: Secondary | ICD-10-CM | POA: Diagnosis not present

## 2020-09-26 LAB — COMPREHENSIVE METABOLIC PANEL
ALT: 14 U/L (ref 0–44)
AST: 16 U/L (ref 15–41)
Albumin: 3.8 g/dL (ref 3.5–5.0)
Alkaline Phosphatase: 84 U/L (ref 38–126)
Anion gap: 11 (ref 5–15)
BUN: 34 mg/dL — ABNORMAL HIGH (ref 6–20)
CO2: 24 mmol/L (ref 22–32)
Calcium: 9.1 mg/dL (ref 8.9–10.3)
Chloride: 103 mmol/L (ref 98–111)
Creatinine, Ser: 3.04 mg/dL — ABNORMAL HIGH (ref 0.44–1.00)
GFR, Estimated: 17 mL/min — ABNORMAL LOW (ref >60)
Glucose, Bld: 248 mg/dL — ABNORMAL HIGH (ref 70–99)
Potassium: 4.2 mmol/L (ref 3.5–5.1)
Sodium: 138 mmol/L (ref 135–145)
Total Bilirubin: 0.8 mg/dL (ref 0.3–1.2)
Total Protein: 7.9 g/dL (ref 6.5–8.1)

## 2020-09-26 LAB — CBC WITH DIFFERENTIAL/PLATELET
Abs Immature Granulocytes: 0.02 10*3/uL (ref 0.00–0.07)
Basophils Absolute: 0 10*3/uL (ref 0.0–0.1)
Basophils Relative: 1 %
Eosinophils Absolute: 0.2 10*3/uL (ref 0.0–0.5)
Eosinophils Relative: 3 %
HCT: 32.6 % — ABNORMAL LOW (ref 36.0–46.0)
Hemoglobin: 10.4 g/dL — ABNORMAL LOW (ref 12.0–15.0)
Immature Granulocytes: 0 %
Lymphocytes Relative: 38 %
Lymphs Abs: 2.4 10*3/uL (ref 0.7–4.0)
MCH: 29.6 pg (ref 26.0–34.0)
MCHC: 31.9 g/dL (ref 30.0–36.0)
MCV: 92.9 fL (ref 80.0–100.0)
Monocytes Absolute: 0.6 10*3/uL (ref 0.1–1.0)
Monocytes Relative: 10 %
Neutro Abs: 3.1 10*3/uL (ref 1.7–7.7)
Neutrophils Relative %: 48 %
Platelets: 302 10*3/uL (ref 150–400)
RBC: 3.51 MIL/uL — ABNORMAL LOW (ref 3.87–5.11)
RDW: 13 % (ref 11.5–15.5)
WBC: 6.4 10*3/uL (ref 4.0–10.5)
nRBC: 0 % (ref 0.0–0.2)

## 2020-09-26 LAB — RESP PANEL BY RT-PCR (FLU A&B, COVID) ARPGX2
Influenza A by PCR: NEGATIVE
Influenza B by PCR: NEGATIVE
SARS Coronavirus 2 by RT PCR: NEGATIVE

## 2020-09-26 LAB — ETHANOL: Alcohol, Ethyl (B): <10 mg/dL (ref <10)

## 2020-09-26 LAB — I-STAT BETA HCG BLOOD, ED (MC, WL, AP ONLY): I-stat hCG, quantitative: <5 m[IU]/mL (ref <5)

## 2020-09-26 MED ORDER — CLONIDINE HCL 0.1 MG PO TABS
0.1000 mg | ORAL_TABLET | Freq: Once | ORAL | Status: AC
  Administered 2020-09-26: 0.1 mg via ORAL

## 2020-09-26 MED ORDER — CLONIDINE HCL 0.1 MG PO TABS
0.1000 mg | ORAL_TABLET | Freq: Once | ORAL | Status: AC
  Administered 2020-09-27: 0.1 mg via ORAL
  Filled 2020-09-26: qty 1

## 2020-09-26 MED ORDER — DOCUSATE SODIUM 100 MG PO CAPS: 100.0000 mg | ORAL_CAPSULE | Freq: Every day | ORAL | Status: DC | PRN

## 2020-09-26 MED ORDER — AMLODIPINE BESYLATE 5 MG PO TABS
10.0000 mg | ORAL_TABLET | Freq: Every day | ORAL | Status: DC
  Administered 2020-09-26 – 2020-09-27 (×2): 10 mg via ORAL
  Filled 2020-09-26 (×2): qty 2

## 2020-09-26 MED ORDER — GLIPIZIDE 10 MG PO TABS
10.0000 mg | ORAL_TABLET | Freq: Two times a day (BID) | ORAL | Status: DC
  Administered 2020-09-27: 10 mg via ORAL
  Filled 2020-09-26 (×2): qty 1

## 2020-09-26 MED ORDER — INSULIN GLARGINE-YFGN 100 UNIT/ML ~~LOC~~ SOLN
15.0000 [IU] | Freq: Every day | SUBCUTANEOUS | Status: DC
  Administered 2020-09-26: 15 [IU] via SUBCUTANEOUS
  Filled 2020-09-26: qty 0.15

## 2020-09-26 MED ORDER — BUSPIRONE HCL 10 MG PO TABS
5.0000 mg | ORAL_TABLET | Freq: Two times a day (BID) | ORAL | Status: DC
  Administered 2020-09-26 – 2020-09-27 (×2): 5 mg via ORAL
  Filled 2020-09-26 (×2): qty 1

## 2020-09-26 MED ORDER — SERTRALINE HCL 50 MG PO TABS
50.0000 mg | ORAL_TABLET | Freq: Every day | ORAL | Status: DC
  Administered 2020-09-26 – 2020-09-27 (×2): 50 mg via ORAL
  Filled 2020-09-26 (×2): qty 1

## 2020-09-26 MED ORDER — ONDANSETRON HCL 4 MG PO TABS: 4.0000 mg | ORAL_TABLET | Freq: Three times a day (TID) | ORAL | Status: DC | PRN

## 2020-09-26 MED ORDER — ALUM & MAG HYDROXIDE-SIMETH 200-200-20 MG/5ML PO SUSP: 30.0000 mL | Freq: Four times a day (QID) | ORAL | Status: DC | PRN

## 2020-09-26 MED ORDER — CARVEDILOL 3.125 MG PO TABS
3.1250 mg | ORAL_TABLET | Freq: Two times a day (BID) | ORAL | Status: DC
  Administered 2020-09-27: 3.125 mg via ORAL
  Filled 2020-09-26: qty 1

## 2020-09-26 NOTE — BH Assessment (Signed)
@  1358, requested nursing Marzetta Board, RN) to place the TTS machine in patient's room.

## 2020-09-26 NOTE — ED Notes (Signed)
TTS in progress 

## 2020-09-26 NOTE — Progress Notes (Signed)
Subjective:  Patient ID: Lorraine Turner, female    DOB: 10/16/1960  Age: 60 y.o. MRN: [id number]  CC: Hospitalization Follow-up   HPI Lorraine Turner is a 60 y.o. year old female with a history of schizoaffective affective disorder, hypertension, diabetic neuropathy, type 2 diabetes mellitus (A1c 8.2), stage IV CKD who presents today for a follow-up visit. She is here for follow-up from hospitalization where she was hospitalized at [facility] for suicidal attempt from 7/22 through 09/01/2020 -she was standing in traffic trying to get hit by a car was found by police.  Interval History: "I am tired of living, I am homeless now". She is unable to eat healthy.  She has no plan of committing suicide. She did not take her medications today she states there is no point.  Currently his manager had assisted her in obtaining her medications. Reviewed notes from LCSW and RN case manager from 09/18/2020. She should be followed by family services of the [place] Past Medical History:  Diagnosis Date   Anxiety    Bipolar 1 disorder ([place])    Depression    Diabetes mellitus without complication ([place])    Gallstones    Hyperlipidemia    Hypertension    Neuropathy    Schizophrenia ([place])    Seizures ([place])    X1- febrile seizure as a child-none since    Past Surgical History:  Procedure Laterality Date   CESAREAN SECTION  05/1998   X 1   CHOLECYSTECTOMY N/A 03/27/2016   Procedure: LAPAROSCOPIC CHOLECYSTECTOMY;  Surgeon: Olean Ree, MD;  Location: [facility] ORS;  Service: General;  Laterality: N/A;   TONSILLECTOMY AND ADENOIDECTOMY     age 79   TUBAL LIGATION      Family History  Problem Relation Age of Onset   Diabetes Mother    Heart disease Mother    Kidney disease Mother    Stroke Mother    Hyperlipidemia Mother    Diabetes Maternal Aunt     No Known Allergies  Outpatient Medications Prior to Visit  Medication Sig Dispense Refill   amLODipine (NORVASC) 10 MG tablet Take  1 tablet (10 mg total) by mouth daily. 30 tablet 1   Blood Glucose Monitoring Suppl (TRUE METRIX METER) w/Device KIT 1 Device by Does not apply route 3 (three) times daily. For blood sugar checks 1 kit 0   busPIRone (BUSPAR) 5 MG tablet Take 1 tablet (5 mg total) by mouth 2 (two) times daily. 60 tablet 1   carvedilol (COREG) 3.125 MG tablet Take 1 tablet (3.125 mg total) by mouth 2 (two) times daily with a meal. 60 tablet 1   gabapentin (NEURONTIN) 100 MG capsule Take 1 capsule (100 mg total) by mouth 3 (three) times daily. 90 capsule 2   glipiZIDE (GLUCOTROL) 10 MG tablet Take 1 tablet (10 mg total) by mouth 2 (two) times daily before a meal. 60 tablet 1   glucose blood test strip Use as instructed 100 each 12   hydrALAZINE (APRESOLINE) 25 MG tablet Take 1 tablet (25 mg total) by mouth 3 (three) times daily. 90 tablet 1   insulin glargine (LANTUS) 100 UNIT/ML injection Inject 0.15 mLs (15 Units total) into the skin at bedtime. 10 mL 1   Insulin Syringe-Needle U-100 (SAFETY INSULIN SYRINGES) 30G X 5/16" 0.5 ML MISC 1 each by Does not apply route at bedtime. 100 each 3   QUEtiapine (SEROQUEL) 100 MG tablet Take 1 tablet (100 mg total) by mouth at bedtime.  For mood control 30 tablet 1   sertraline (ZOLOFT) 50 MG tablet Take 1 tablet (50 mg total) by mouth daily. 30 tablet 1   TRUEplus Lancets 28G MISC 1 each by Does not apply route 3 (three) times daily. For blood sugar checks 100 each 12   No facility-administered medications prior to visit.     ROS Review of Systems  Constitutional:  Negative for activity change, appetite change and fatigue.  HENT:  Negative for congestion, sinus pressure and sore throat.   Eyes:  Negative for visual disturbance.  Respiratory:  Negative for cough, chest tightness, shortness of breath and wheezing.   Cardiovascular:  Negative for chest pain and palpitations.  Gastrointestinal:  Negative for abdominal distention, abdominal pain and constipation.  Endocrine:  Negative for polydipsia.  Genitourinary:  Negative for dysuria and frequency.  Musculoskeletal:  Negative for arthralgias and back pain.  Skin:  Negative for rash.  Neurological:  Negative for tremors, light-headedness and numbness.  Hematological:  Does not bruise/bleed easily.  Psychiatric/Behavioral:  Positive for dysphoric mood and suicidal ideas. Negative for agitation and behavioral problems.    Objective:  BP (!) 212/81   Pulse 61   Ht '5\' 5"'  (1.651 m)   Wt 139 lb 3.2 oz (63.1 kg)   SpO2 100%   BMI 23.16 kg/m   BP/Weight 09/26/2020 09/05/2020 2/72/5366  Systolic BP 440 347 -  Diastolic BP 81 57 -  Wt. (Lbs) 139.2 - 163.14  BMI 23.16 - 27.15  Some encounter information is confidential and restricted. Go to Review Flowsheets activity to see all data.      Physical Exam Constitutional:      Appearance: She is well-developed.  Cardiovascular:     Rate and Rhythm: Normal rate.     Heart sounds: Normal heart sounds. No murmur heard. Pulmonary:     Effort: Pulmonary effort is normal.     Breath sounds: Normal breath sounds. No wheezing or rales.  Chest:     Chest wall: No tenderness.  Abdominal:     General: Bowel sounds are normal. There is no distension.     Palpations: Abdomen is soft. There is no mass.     Tenderness: There is no abdominal tenderness.  Musculoskeletal:        General: Normal range of motion.     Right lower leg: No edema.     Left lower leg: No edema.  Neurological:     Mental Status: She is alert and oriented to person, place, and time.  Psychiatric:     Comments: Dysphoric mood    CMP Latest Ref Rng & Units 09/05/2020 09/04/2020 09/03/2020  Glucose 70 - 99 mg/dL 151(H) 122(H) 165(H)  BUN 6 - 20 mg/dL 29(H) 29(H) 30(H)  Creatinine 0.44 - 1.00 mg/dL 2.81(H) 3.00(H) 3.43(H)  Sodium 135 - 145 mmol/L 140 138 135  Potassium 3.5 - 5.1 mmol/L 4.4 4.1 4.1  Chloride 98 - 111 mmol/L 112(H) 110 107  CO2 22 - 32 mmol/L '24 23 23  ' Calcium 8.9 - 10.3 mg/dL  8.5(L) 8.6(L) 9.0  Total Protein 6.5 - 8.1 g/dL - - -  Total Bilirubin 0.3 - 1.2 mg/dL - - -  Alkaline Phos 38 - 126 U/L - - -  AST 15 - 41 U/L - - -  ALT 0 - 44 U/L - - -    Lipid Panel     Component Value Date/Time   CHOL 235 (H) 01/13/2019 1507   TRIG 91 01/13/2019 1507  HDL 61 01/13/2019 1507   CHOLHDL 3.9 01/13/2019 1507   CHOLHDL 3.9 03/05/2018 0635   VLDL 14 03/05/2018 0635   LDLCALC 158 (H) 01/13/2019 1507    CBC    Component Value Date/Time   WBC 8.8 09/03/2020 0521   RBC 3.20 (L) 09/03/2020 0521   HGB 9.5 (L) 09/03/2020 0521   HGB 10.1 (L) 10/21/2019 1531   HCT 29.1 (L) 09/03/2020 0521   HCT 30.5 (L) 10/21/2019 1531   PLT 224 09/03/2020 0521   PLT 270 10/21/2019 1531   MCV 90.9 09/03/2020 0521   MCV 89 10/21/2019 1531   MCH 29.7 09/03/2020 0521   MCHC 32.6 09/03/2020 0521   RDW 13.1 09/03/2020 0521   RDW 13.2 10/21/2019 1531   LYMPHSABS 0.9 09/01/2020 2050   LYMPHSABS 2.1 10/21/2019 1531   MONOABS 0.7 09/01/2020 2050   EOSABS 0.1 09/01/2020 2050   EOSABS 0.2 10/21/2019 1531   BASOSABS 0.1 09/01/2020 2050   BASOSABS 0.0 10/21/2019 1531    Lab Results  Component Value Date   HGBA1C 8.2 (H) 09/01/2020    Assessment & Plan:  1. Accelerated hypertension Uncontrolled due to patient deliberately refusing to take her medications Underlying depression and homelessness contributing Strongly encouraged to take medications. Clonidine 0.60m administered in the Clinic and blood pressure repeated.  2. Severe episode of recurrent major depressive disorder, with psychotic features ([place] High risk patient with previous history of suicidal attempt currently a danger to self She was previously followed by [facility] services of the [place] should be on Zoloft and Seroquel which she has not been compliant with Followed by LCSW Will have her transported to behavioral health as inpatient hospitalization is required due to severity of symptoms. Contacted GHar[facility]-Davidsonwill be sending behavioral health officers to transport patient  3. Homelessness Have spoken with RN case manager who shares that she has reviewed available community resources with the patient including [place], resources for meals but the patient has not followed through with requirements.    Meds ordered this encounter  Medications   cloNIDine (CATAPRES) tablet 0.1 mg     Follow-up: Return in about 1 month (around 10/27/2020) for Medical conditions.    Spent greater than 50 minutes evaluating the patient, administering clonidine and monitoring blood pressure, coordinating between case management [place] [employer] to secure safe transportation for voluntary commitment to [employer]   ECharlott Rakes MD, FAAFP. [facility] Hospital[facility] WM[facility]  09/26/2020, 12:27 PM
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
## 2020-09-26 NOTE — ED Notes (Signed)
Pt resting comfortably at this time awaiting TTS consult.

## 2020-09-26 NOTE — ED Provider Notes (Signed)
Signout from [employer] at shift change.   Pt pending medical clearance and TTS evaluation.  Patient has known diabetes and chronic kidney disease.  It sounds as though she has been noncompliant with her medications.  She was recently admitted to the hospital for psych evaluation in setting of uncontrolled hypertension and progressively worsening chronic kidney disease.  Labs today are similar to previous with elevated blood sugar, creatinine around 3.0.  I have ordered patient's home blood pressure and diabetes medications.  Her kidney function has become progressively worse, however this has occurred over the past 2 years or so.  In the short-term, kidney function appears to be stable based on labs from previous admission.  BP (!) 169/86 (BP Location: Left Arm)   Pulse (!) 56   Temp 98.4 F (36.9 C) (Oral)   Resp 18   Awaiting TTS recommendations.    11:10 PM patient meets inpatient criteria.  At this time she is voluntary.  Her home medications including blood pressure medications and insulin have been ordered.  Patient is currently cleared medically.  We will need to keep a close eye on her vital signs to ensure blood pressure improves with oral medications.  Again, chronic kidney disease noted, worsening over the past 2 years, but appears to be stable from recent hospitalization.  BP (!) 203/80 (BP Location: Left Arm)   Pulse 62   Temp 97.8 F (36.6 C) (Oral)   Resp 17   SpO2 100%     Carlisle Cater, PA-C 09/26/20 2311    Lacretia Leigh, MD 09/28/20 1352

## 2020-09-26 NOTE — BH Assessment (Addendum)
Clinician sent a secure IM to pt's providers, [employer] PA-C and [employer], at [phone] requesting the Tele-Assessment machine be moved to pt's room to complete her MH Assessment. No other providers (RNs, Techs, etc) are currently "connected" to this pt.

## 2020-09-26 NOTE — ED Provider Notes (Signed)
[place] DEPT Provider Note   CSN: [id number] Arrival date & time: 09/26/20  1308     History Chief Complaint  Patient presents with   Suicidal    Lorraine Turner is a 60 y.o. female.  HPI  Patient with history of bipolar 1, schizophrenia, major depression presents with suicidal ideations x3 weeks.  She was seen and admitted 7/22 through 7/29 for the same concern.  She was started on Zoloft and antipsychotic medicine, she states she has not been taking it because she had issues with insurance.  She states she picked it up on Wednesday but has not had the motivation to start taking her antidepressant medicines at home.  Patient is homeless, states this is a triggering event for her current mood.  She has history of suicide attempts, including trying to throw herself in front of oncoming traffic.  She currently has a plan of doing the same.  She denies any homicidal ideations, hallucinations, delusions.  States "there just does not seem to be a point of anything".  She was brought here with police escort from [place] and wellness voluntarily. Her BP was elevated at the clinic and she was given clonidine. She denies any headache, vision changes, SOB or chest pain.  Past Medical History:  Diagnosis Date   Anxiety    Bipolar 1 disorder ([place])    Depression    Diabetes mellitus without complication ([place])    Gallstones    Hyperlipidemia    Hypertension    Neuropathy    Schizophrenia ([place])    Seizures ([place])    X1- febrile seizure as a child-none since    Patient Active Problem List   Diagnosis Date Noted   Acute worsening of stage 4 chronic kidney disease ([place])    Hypertensive urgency 09/01/2020   CKD (chronic kidney disease), stage III ([place]) 09/01/2020   DKA (diabetic ketoacidoses) 09/12/2018   Suicide ideation 09/12/2018   AKI (acute kidney injury) ([place]) 09/12/2018   Adjustment disorder with mixed disturbance of emotions and conduct    MDD  (major depressive disorder), severe ([place]) 83/66/2947   Umbilical hernia with obstruction 11/24/2015   Symptomatic cholelithiasis 11/24/2015   Type 2 diabetes mellitus with hyperglycemia ([place])    Suicide attempt ([place]) 05/06/2014   Schizoaffective disorder, depressive type ([place]) 08/14/2011   UNSPECIFIED ANEMIA 01/23/2009   Primary hypertension 10/12/2008   HYPERCHOLESTEROLEMIA 01/01/2007   PANIC DISORDER 01/01/2007   Obsessive compulsive disorder 01/01/2007    Past Surgical History:  Procedure Laterality Date   CESAREAN SECTION  05/1998   X 1   CHOLECYSTECTOMY N/A 03/27/2016   Procedure: LAPAROSCOPIC CHOLECYSTECTOMY;  Surgeon: Olean Ree, MD;  Location: [facility] ORS;  Service: General;  Laterality: N/A;   TONSILLECTOMY AND ADENOIDECTOMY     age 9   TUBAL LIGATION       OB History   No obstetric history on file.     Family History  Problem Relation Age of Onset   Diabetes Mother    Heart disease Mother    Kidney disease Mother    Stroke Mother    Hyperlipidemia Mother    Diabetes Maternal Aunt     Social History   Tobacco Use   Smoking status: Former    Types: Cigarettes    Quit date: 03/14/2013    Years since quitting: 7.5   Smokeless tobacco: Never   Tobacco comments:    1 cigarette1-2 times year, only when she gets stressed  Vaping Use  Vaping Use: Never used  Substance Use Topics   Alcohol use: Yes    Alcohol/week: 1.0 standard drink    Types: 1 Cans of beer per week    Comment: One can of beer every 2-3 months.    Drug use: No    Home Medications Prior to Admission medications   Medication Sig Start Date End Date Taking? Authorizing Provider  amLODipine (NORVASC) 10 MG tablet Take 1 tablet (10 mg total) by mouth daily. 09/06/20   Shawna Clamp, MD  Blood Glucose Monitoring Suppl (TRUE METRIX METER) w/Device KIT 1 Device by Does not apply route 3 (three) times daily. For blood sugar checks 09/13/18   Barb Merino, MD  busPIRone (BUSPAR) 5 MG tablet Take 1  tablet (5 mg total) by mouth 2 (two) times daily. 09/05/20   Shawna Clamp, MD  carvedilol (COREG) 3.125 MG tablet Take 1 tablet (3.125 mg total) by mouth 2 (two) times daily with a meal. 09/05/20   Shawna Clamp, MD  gabapentin (NEURONTIN) 100 MG capsule Take 1 capsule (100 mg total) by mouth 3 (three) times daily. 09/05/20 12/04/20  Shawna Clamp, MD  glipiZIDE (GLUCOTROL) 10 MG tablet Take 1 tablet (10 mg total) by mouth 2 (two) times daily before a meal. 09/05/20 10/05/20  Shawna Clamp, MD  glucose blood test strip Use as instructed 10/28/18   Argentina Donovan, PA-C  hydrALAZINE (APRESOLINE) 25 MG tablet Take 1 tablet (25 mg total) by mouth 3 (three) times daily. 09/05/20   Shawna Clamp, MD  insulin glargine (LANTUS) 100 UNIT/ML injection Inject 0.15 mLs (15 Units total) into the skin at bedtime. 09/05/20   Shawna Clamp, MD  Insulin Syringe-Needle U-100 (SAFETY INSULIN SYRINGES) 30G X 5/16" 0.5 ML MISC 1 each by Does not apply route at bedtime. 10/21/19   Argentina Donovan, PA-C  QUEtiapine (SEROQUEL) 100 MG tablet Take 1 tablet (100 mg total) by mouth at bedtime. For mood control 09/05/20   Shawna Clamp, MD  sertraline (ZOLOFT) 50 MG tablet Take 1 tablet (50 mg total) by mouth daily. 09/06/20   Shawna Clamp, MD  TRUEplus Lancets 28G MISC 1 each by Does not apply route 3 (three) times daily. For blood sugar checks 09/13/18   Barb Merino, MD    Allergies    Patient has no known allergies.  Review of Systems   Review of Systems  Constitutional:  Negative for fever.  Respiratory:  Negative for shortness of breath.   Cardiovascular:  Negative for chest pain.  Psychiatric/Behavioral:  Positive for suicidal ideas. Negative for confusion, hallucinations, self-injury and sleep disturbance.    Physical Exam Updated Vital Signs BP (!) 169/86 (BP Location: Left Arm)   Pulse (!) 56   Temp 98.4 F (36.9 C) (Oral)   Resp 18   Physical Exam Vitals and nursing note reviewed. Exam conducted  with a chaperone present.  Constitutional:      General: She is not in acute distress.    Appearance: Normal appearance.  HENT:     Head: Normocephalic and atraumatic.  Eyes:     General: No scleral icterus.    Extraocular Movements: Extraocular movements intact.     Pupils: Pupils are equal, round, and reactive to light.  Cardiovascular:     Rate and Rhythm: Normal rate and regular rhythm.  Pulmonary:     Effort: Pulmonary effort is normal.     Breath sounds: Normal breath sounds.  Skin:    Coloration: Skin is not jaundiced.  Neurological:  Mental Status: She is alert. Mental status is at baseline.     Coordination: Coordination normal.  Psychiatric:        Attention and Perception: Attention normal.        Mood and Affect: Mood is depressed. Affect is flat.        Speech: Speech normal.        Behavior: Behavior normal.        Thought Content: Thought content includes suicidal ideation. Thought content does not include homicidal ideation. Thought content includes suicidal plan.   ED Results / Procedures / Treatments   Labs (all labs ordered are listed, but only abnormal results are displayed) Labs Reviewed  RESP PANEL BY RT-PCR (FLU A&B, COVID) [id number]  COMPREHENSIVE METABOLIC PANEL  ETHANOL  RAPID URINE DRUG SCREEN, HOSP PERFORMED  CBC WITH DIFFERENTIAL/PLATELET  I-STAT BETA HCG BLOOD, ED ([facility], [facility], [facility] ONLY)    EKG None  Radiology No results found.  Procedures Procedures   Medications Ordered in ED Medications - No data to display  ED Course  I have reviewed the triage vital signs and the nursing notes.  Pertinent labs & imaging results that were available during my care of the patient were reviewed by me and considered in my medical decision making (see chart for details).    MDM Rules/Calculators/A&P                           Patient is hypertensive, otherwise vitals are stable.  She was suicidal with plans and previous attempts.  I think she should  be admitted for psych evaluation and work-up.  She has had a recent admission for the same, she has been homeless for the last 3 weeks and that seems to be an aggravating factor.  Per note, she has not taken the steps for social work to be able to assist her outpatient.  Additionally, she was sent here with concerns about blood pressure.  She denied having any chest pain, shortness of breath, headache, vision changes noted be concerning for pretense of urgency.  We will check basic labs including a urine to evaluate for signs of renal impairment.  We will also get an EKG.  Low suspicion for hypertensive emergency based on initial blood pressure intake here in the ED.  Patient lab work is pending at the time of shift change.  I suspect patient will be medically cleared, once that happens she can be admitted to psych.  Patient is here voluntarily and does not seem like she needs IVC, she reports she is willing to stay and wants to be worked up by psych.  Do not think she needs IVC at this time.  Care is signed out to [employer] PA-C at shift change. Dispo to follow.  Final Clinical Impression(s) / ED Diagnoses Final diagnoses:  None    Rx / DC Orders ED Discharge Orders     None        Sherrill Raring, Hershal Coria 09/26/20 1531    Kommor, Debe Coder, MD 09/26/20 2050
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
## 2020-09-26 NOTE — ED Notes (Addendum)
Patient states she has gas but has not a bowel movement in 7 days

## 2020-09-26 NOTE — ED Triage Notes (Addendum)
[facility]. Brought for HTN and SI, and depression. Voluntary

## 2020-09-26 NOTE — ED Notes (Signed)
Pt states she is unable to provide a urine sample at this time.

## 2020-09-27 ENCOUNTER — Inpatient Hospital Stay (HOSPITAL_COMMUNITY): Admission: AD | Admit: 2020-09-27 | Payer: Medicaid Other | Source: Intra-hospital | Admitting: Emergency Medicine

## 2020-09-27 NOTE — Discharge Instructions (Addendum)
For your behavioral health needs you are advised to follow up with [facility] at your earliest opportunity:       [facility]      [place], [place] [address]      [phone]      They offer psychiatry/medication management and therapy.  New patients are seen in their walk-in clinic.  Walk-in hours are Monday - Thursday from 8:00 am - 11:00 am for psychiatry, and Friday from 1:00 pm - 4:00 pm for therapy.  Walk-in patients are seen on a first come, first served basis, so try to arrive as early as possible for the best chance of being seen the same day.

## 2020-09-27 NOTE — Telephone Encounter (Signed)
Noted. Will fu with pt at appt next week if she is discharged.   Thanks so much for your help with this!

## 2020-09-27 NOTE — ED Notes (Signed)
Patient has been calm and cooperative this shift.  Mood is depressed.  Interaction with staff minimal.  No distress noted.
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
## 2020-09-27 NOTE — BH Assessment (Addendum)
[facility] Assessment Progress Note   Per Pecolia Ades, NP, this voluntary pt does not require psychiatric hospitalization at this time.  Pt is psychiatrically cleared.  Discharge instructions advise pt to follow up with [facility].  EDP Gareth Morgan, MD and pt's nurse, Beth, have been notified.   Jalene Mullet, [place] Coordinator [phone]

## 2020-09-27 NOTE — ED Provider Notes (Signed)
  Physical Exam  BP (!) 130/56 (BP Location: Left Arm)   Pulse (!) 59   Temp 98.2 F (36.8 C) (Oral)   Resp 18   SpO2 94%   Physical Exam  ED Course/Procedures     Procedures  MDM  Received care of patient from previous providers.  Please see their notes for prior history, physical and care.  With a 60 year old who presented with depression and suicidal thoughts.  Was started on BP medications last night and given additional one time dose of clonidine with BP coming down to 130. She is not having symptoms related to this change.  After  initial plan for inpatient, she was cleared by psychiatry this AM.  Reports feeling better and that she will return if she worsens.       Gareth Morgan, MD 09/29/20 [phone]

## 2020-09-27 NOTE — ED Notes (Signed)
Pt DC off unit to home per provider. Pt alert, calm, cooperative, no s/s of distress.  DC information given to pt, pt acknowledged understanding.  Belongings given to pt. Pt ambulatory off unit, escorted by NT. Pt given a bus pass for transportation.

## 2020-09-28 ENCOUNTER — Telehealth: Payer: Self-pay

## 2020-09-28 NOTE — Telephone Encounter (Signed)
Call placed to patient to check on her # [phone], the recording stated that the person does not have a voicemail set up.

## 2020-10-03 NOTE — Telephone Encounter (Signed)
noted 

## 2020-10-03 NOTE — Telephone Encounter (Signed)
Spoke with pt and scheduled [facility] transportation for tomorrow's appt.

## 2020-10-04 ENCOUNTER — Other Ambulatory Visit: Payer: Self-pay

## 2020-10-04 ENCOUNTER — Ambulatory Visit: Payer: Medicaid Other | Attending: Family Medicine | Admitting: Clinical

## 2020-10-04 DIAGNOSIS — Z5900 Homelessness unspecified: Secondary | ICD-10-CM

## 2020-10-04 DIAGNOSIS — F333 Major depressive disorder, recurrent, severe with psychotic symptoms: Secondary | ICD-10-CM

## 2020-10-04 NOTE — Telephone Encounter (Signed)
Met with the patient when she was in the clinic today.  She said she is feeling much better and is now taking her medications. She was at the clinic to meet with Asante McCoy, LCSW. She explained that she continues to stay at the house where she has been.   She also noted that she has been contacted by [employer] [place] and is expecting a call back from them again to discuss housing/shelter options.   Provided her with some food from the food pantry.

## 2020-10-06 ENCOUNTER — Telehealth: Payer: Self-pay | Admitting: Clinical

## 2020-10-06 NOTE — Telephone Encounter (Signed)
Closed
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
## 2020-10-18 NOTE — Telephone Encounter (Signed)
We sure can.

## 2020-10-19 ENCOUNTER — Telehealth: Payer: Self-pay

## 2020-10-19 NOTE — Telephone Encounter (Signed)
[facility] Referral faxed on 10/19/20

## 2020-10-25 ENCOUNTER — Telehealth: Payer: Self-pay

## 2020-10-25 ENCOUNTER — Other Ambulatory Visit: Payer: Self-pay

## 2020-10-25 ENCOUNTER — Telehealth: Payer: Self-pay | Admitting: Family Medicine

## 2020-10-25 ENCOUNTER — Ambulatory Visit: Payer: Medicaid Other | Attending: Family Medicine | Admitting: Clinical

## 2020-10-25 DIAGNOSIS — F411 Generalized anxiety disorder: Secondary | ICD-10-CM | POA: Diagnosis not present

## 2020-10-25 DIAGNOSIS — F331 Major depressive disorder, recurrent, moderate: Secondary | ICD-10-CM

## 2020-10-25 DIAGNOSIS — Z5900 Homelessness unspecified: Secondary | ICD-10-CM | POA: Diagnosis not present

## 2020-10-25 NOTE — Telephone Encounter (Signed)
This CM spoke to patient today when she was in the clinic

## 2020-10-25 NOTE — Telephone Encounter (Signed)
Copied from [place] [id number]. Topic: General - Other >> Oct 23, 2020  2:59 PM Pawlus, Brayton Layman A wrote: Reason for CRM: Pt stated she just missed a call from Opal Sidles, please call back, pt stated it is very important.

## 2020-10-25 NOTE — Telephone Encounter (Signed)
Met with the patient when she was in the clinic today for her appointment with Asante McCoy, LCSW.   The patient explained that she has been officially evicted from the home where she had been staying.  She discussed shelter/housing resources with Asante. She is currently staying outside and is seeking assistance from the [facility] for community support services.   She stated that she has not heard from [facility] and is in agreement to re-submitting a referral and signed the release of information.

## 2020-10-26 NOTE — Telephone Encounter (Signed)
Call placed to Myami/[facility] # [phone] x [phone]. She explained that she has been trying to reach the patient but has not had any success.  She is unable to leave a message for the patient.  Explained to her that the patient is now truly homeless, staying at [facility] and going to [facility] for assistance. Also informed her that the patient has been instructed to try to keep her phone charged and return any calls that she receives as people are trying to assist her.   Myami said that she will be going to [facility] as well as contacting [facility] in effort to connect with the patient.  Signed authorization to disclose information sent via [place] email to [facility].

## 2020-10-27 NOTE — Telephone Encounter (Signed)
Thanks so much. 

## 2020-10-27 NOTE — BH Specialist Note (Signed)
Integrated Behavioral Health Follow Up In-Person Visit  MRN: [id number] Name: Lorraine Turner  Number of [place] Clinician visits: 3/6 Session Start time: 11:20am  Session End time: 12:20pm Total time: 60 minutes  Types of Service: Individual psychotherapy  Interpretor:No. Interpretor Name and Language: N/A  Subjective: Lorraine Turner is a 60 y.o. female accompanied by  self Patient was referred by PCP [place] for mood, hx of suicide attempt, and homelessness. Patient reports the following symptoms/concerns: Pt reports feeling anxious and worrying excessively. Reports that she still feels depressed at times. Reports  that she was recently evicted from her home and has been sleeping outside in downtown [place]. Reports that she has been unable to get in a shelter but has been calling. Reports that she can't stay with her daughter due to their instability. Duration of problem: 4+ years; Severity of problem: severe  Objective: Mood: Anxious and Affect: Appropriate Risk of harm to self or others: No plan to harm self or others  Life Context: Family and Social: Reports that she has five children. Reports that her two daughters and grandchildren have started staying with her, one of her son's is in prison, and another son and daughter live in [place].  School/Work: Pt is currently unemployed and not receiving income. Pt has applied for disability in the past but was denied. Pt has been referred to legal aid by CM. Pt panhandles for income. Self-Care: Denies hx of substance use. Reports eating an ice cream cone from [employer] as self-care. Reports talking to her "female friend" and spending time with family as a coping skill. Reports that she has not been taking her medications consistently due to forgetting. Pt has started utilizing positive affirmations and deep breathing exercises. Pt utilizes prayer as Technical sales engineer. Life Changes: Pt 29 spouse passed away in 2016/05/25 and he was  the primary financial provider. Pt's son assisted with finances until he went to prison. Pt has recently been evicted from her home and has been sleeping outside in downtown [place]. Pt has hx of schizophrenia, bipolar disorder, and OCD and has been involved in tx in the past. Pt reports that mental health has impacted her ability to work in the past.   Patient and/or Family's Strengths/Protective Factors: Social connections and Sense of purpose  Goals Addressed: Patient will:  Reduce symptoms of: anxiety, depression, and stress   Increase knowledge and/or ability of: coping skills and stress reduction   Demonstrate ability to: Increase healthy adjustment to current life circumstances and Increase adequate support systems for patient/family  Progress towards Goals: Ongoing  Interventions: Interventions utilized:  Mindfulness or Psychologist, educational, CBT Cognitive Behavioral Therapy, Supportive Counseling, Psychoeducation and/or Health Education, and Link to [employer] Standardized Assessments completed: GAD-7 and PHQ 9  Depression screen [facility] 2/9 10/25/2020 10/06/2020 09/26/2020 09/26/2020 09/19/2020  Decreased Interest 0 '2 3 3 3  '$ Down, Depressed, Hopeless 0 0 '3 3 3  '$ PHQ - 2 Score 0 '2 6 6 6  '$ Altered sleeping 0 0 '3 3 2  '$ Tired, decreased energy 0 0 - 3 2  Change in appetite 1 1 0 1 1  Feeling bad or failure about yourself  0 '1 3 3 3  '$ Trouble concentrating 0 0 '1 1 3  '$ Moving slowly or fidgety/restless 0 0 0 0 0  Suicidal thoughts - 0 1 0 -  PHQ-9 Score '1 4 14 17 17  '$ Difficult doing work/chores - - Very difficult - -  Some recent data might be hidden  GAD 7 : Generalized Anxiety Score 10/27/2020 10/04/2020 09/26/2020 09/19/2020  Nervous, Anxious, on Edge 0 0 3 3  Control/stop worrying 1 0 3 3  Worry too much - different things 0 0 3 3  Trouble relaxing 0 0 0 3  Restless 0 0 0 2  Easily annoyed or irritable 0 0 3 3  Afraid - awful might happen 0 '1 3 3  '$ Total GAD 7 Score '1 1 15 20   '$ Anxiety Difficulty - - - -      Patient and/or Family Response: Pt receptive to tx. Pt receptive to psychoeducation provided on mental health and the need to adhere to medication. Pt receptive to cognitive restructuring utilized to improve cognitive processing skills. Pt receptive to community resources and will continue calling shelters/partners ending homelessness. Pt receptive to continue healthy coping skills (prayer and eating ice cream) and adhering to medication. Pt receptive to continuing positive affirmations.   Patient Centered Plan: Patient is on the following Treatment Plan(s): Depression, Anxiety, Stress, and homelessness Assessment: Denies SI/HI. Denies auditory/visual hallucinations. No delusions present. No safety risks. Patient currently experiencing ongoing depression and anxiety however pt's mood appears to be improved. Pt continues to remain positive. Pt continues to experience homelessness and is sleeping outside due to being evicted from her home. Pt appears to be motivated for tx and continue searching for housing, income, and shelters. .   Patient may benefit from continuing healthy coping skills with positive affirmations. Pt would benefit from adhering to medication. LCSWA utilized cognitive restructuring to improve pt's cognitive processing about adhering to medication. LCSWA provided psychoeducation on depression and anxiety. Pt has been referred to servant center and legal aid and continues to wait to hear from both. LCSWA provided information on housing resources and assisted pt in calling resources. LCSWA will fu with pt..  Plan: Follow up with behavioral health clinician on : 11/20/20 Behavioral recommendations: Utilize deep breathing exercises, utilize healthy coping skills (prayer and eating ice cream), adhere to medication, and utilize positive affirmations. Continue calling homeless shelters and partners ending homelessness Referral(s): [place] (In Clinic) and [employer]:  Housing "From scale of 1-10, how likely are you to follow plan?": 10  Iridessa Harrow C Orrin Yurkovich, LCSW

## 2020-11-09 ENCOUNTER — Emergency Department (HOSPITAL_COMMUNITY)
Admission: EM | Admit: 2020-11-09 | Discharge: 2020-11-09 | Disposition: A | Discharge status: Home / Self Care | Payer: Medicaid Other | Attending: Emergency Medicine | Admitting: Emergency Medicine

## 2020-11-09 ENCOUNTER — Encounter (HOSPITAL_COMMUNITY): Payer: Self-pay | Admitting: Emergency Medicine

## 2020-11-09 DIAGNOSIS — N184 Chronic kidney disease, stage 4 (severe): Secondary | ICD-10-CM | POA: Insufficient documentation

## 2020-11-09 DIAGNOSIS — Z87891 Personal history of nicotine dependence: Secondary | ICD-10-CM | POA: Insufficient documentation

## 2020-11-09 DIAGNOSIS — R197 Diarrhea, unspecified: Secondary | ICD-10-CM | POA: Diagnosis not present

## 2020-11-09 DIAGNOSIS — E1122 Type 2 diabetes mellitus with diabetic chronic kidney disease: Secondary | ICD-10-CM | POA: Insufficient documentation

## 2020-11-09 DIAGNOSIS — R109 Unspecified abdominal pain: Secondary | ICD-10-CM | POA: Insufficient documentation

## 2020-11-09 DIAGNOSIS — I129 Hypertensive chronic kidney disease with stage 1 through stage 4 chronic kidney disease, or unspecified chronic kidney disease: Secondary | ICD-10-CM | POA: Diagnosis not present

## 2020-11-09 DIAGNOSIS — Z79899 Other long term (current) drug therapy: Secondary | ICD-10-CM | POA: Diagnosis not present

## 2020-11-09 DIAGNOSIS — Z7984 Long term (current) use of oral hypoglycemic drugs: Secondary | ICD-10-CM | POA: Diagnosis not present

## 2020-11-09 LAB — URINALYSIS, ROUTINE W REFLEX MICROSCOPIC
Bilirubin Urine: NEGATIVE
Glucose, UA: NEGATIVE mg/dL
Ketones, ur: NEGATIVE mg/dL
Nitrite: NEGATIVE
Protein, ur: 100 mg/dL — AB
Specific Gravity, Urine: 1.025 (ref 1.005–1.030)
pH: 6 (ref 5.0–8.0)

## 2020-11-09 LAB — LIPASE, BLOOD: Lipase: 25 U/L (ref 11–51)

## 2020-11-09 LAB — URINALYSIS, MICROSCOPIC (REFLEX)

## 2020-11-09 LAB — COMPREHENSIVE METABOLIC PANEL
ALT: 17 U/L (ref 0–44)
AST: 20 U/L (ref 15–41)
Albumin: 3.5 g/dL (ref 3.5–5.0)
Alkaline Phosphatase: 83 U/L (ref 38–126)
Anion gap: 9 (ref 5–15)
BUN: 43 mg/dL — ABNORMAL HIGH (ref 6–20)
CO2: 21 mmol/L — ABNORMAL LOW (ref 22–32)
Calcium: 8.9 mg/dL (ref 8.9–10.3)
Chloride: 107 mmol/L (ref 98–111)
Creatinine, Ser: 3.64 mg/dL — ABNORMAL HIGH (ref 0.44–1.00)
GFR, Estimated: 14 mL/min — ABNORMAL LOW (ref >60)
Glucose, Bld: 225 mg/dL — ABNORMAL HIGH (ref 70–99)
Potassium: 4.4 mmol/L (ref 3.5–5.1)
Sodium: 137 mmol/L (ref 135–145)
Total Bilirubin: 0.7 mg/dL (ref 0.3–1.2)
Total Protein: 7.2 g/dL (ref 6.5–8.1)

## 2020-11-09 LAB — CBC WITH DIFFERENTIAL/PLATELET
Abs Immature Granulocytes: 0.03 10*3/uL (ref 0.00–0.07)
Basophils Absolute: 0 10*3/uL (ref 0.0–0.1)
Basophils Relative: 0 %
Eosinophils Absolute: 0.2 10*3/uL (ref 0.0–0.5)
Eosinophils Relative: 3 %
HCT: 31.9 % — ABNORMAL LOW (ref 36.0–46.0)
Hemoglobin: 10.4 g/dL — ABNORMAL LOW (ref 12.0–15.0)
Immature Granulocytes: 1 %
Lymphocytes Relative: 23 %
Lymphs Abs: 1.4 10*3/uL (ref 0.7–4.0)
MCH: 29.9 pg (ref 26.0–34.0)
MCHC: 32.6 g/dL (ref 30.0–36.0)
MCV: 91.7 fL (ref 80.0–100.0)
Monocytes Absolute: 0.6 10*3/uL (ref 0.1–1.0)
Monocytes Relative: 11 %
Neutro Abs: 3.6 10*3/uL (ref 1.7–7.7)
Neutrophils Relative %: 62 %
Platelets: 282 10*3/uL (ref 150–400)
RBC: 3.48 MIL/uL — ABNORMAL LOW (ref 3.87–5.11)
RDW: 12.8 % (ref 11.5–15.5)
WBC: 5.8 10*3/uL (ref 4.0–10.5)
nRBC: 0 % (ref 0.0–0.2)

## 2020-11-09 MED ORDER — AMLODIPINE BESYLATE 5 MG PO TABS
10.0000 mg | ORAL_TABLET | Freq: Once | ORAL | Status: AC
  Administered 2020-11-09: 10 mg via ORAL
  Filled 2020-11-09: qty 2

## 2020-11-09 NOTE — ED Triage Notes (Signed)
Pt crying when trying to triage worried about her belongings. Pt states she is having uncontrollably diarrhea and abd pain since Tuesday after a ham sandwich.

## 2020-11-09 NOTE — Discharge Instructions (Addendum)
You have been seen and discharged from the emergency department.  Follow-up with your primary provider for reevaluation and further care. Take home medications as prescribed. If you have any worsening symptoms or further concerns for your health please return to an emergency department for further evaluation. 

## 2020-11-09 NOTE — ED Provider Notes (Signed)
[facility] EMERGENCY DEPARTMENT Provider Note   CSN: [id number] Arrival date & time: 11/09/20  7001     History Chief Complaint  Patient presents with   Abdominal Pain    Lorraine Turner is a 60 y.o. female.  HPI  60 year old female with past medical history of schizoaffective disorder, DM, HTN, HLD, CKD presents the emergency department with concern for diarrhea.  Patient states over the past week she is had intermittent diarrhea and abdominal cramping.  Denies any fever or chills.  No associated nausea/vomiting.  Patient currently is homeless and admits to eating a questionable ham sandwich at the beginning of this week when her symptoms started.  Patient states since arriving here and drinking Pepto-Bismol that her symptoms have resolved.  She feels much improved and currently denies any abdominal pain, distention, genitourinary symptoms.  Past Medical History:  Diagnosis Date   Anxiety    Bipolar 1 disorder ([place])    Depression    Diabetes mellitus without complication ([place])    Gallstones    Hyperlipidemia    Hypertension    Neuropathy    Schizophrenia ([place])    Seizures ([place])    X1- febrile seizure as a child-none since    Patient Active Problem List   Diagnosis Date Noted   Acute worsening of stage 4 chronic kidney disease ([place])    Hypertensive urgency 09/01/2020   CKD (chronic kidney disease), stage III ([place]) 09/01/2020   DKA (diabetic ketoacidoses) 09/12/2018   Suicide ideation 09/12/2018   AKI (acute kidney injury) ([place]) 09/12/2018   Adjustment disorder with mixed disturbance of emotions and conduct    MDD (major depressive disorder), severe ([place]) 74/94/4967   Umbilical hernia with obstruction 11/24/2015   Symptomatic cholelithiasis 11/24/2015   Type 2 diabetes mellitus with hyperglycemia ([place])    Suicide attempt ([place]) 05/06/2014   Schizoaffective disorder, depressive type ([place]) 08/14/2011   UNSPECIFIED ANEMIA 01/23/2009   Primary  hypertension 10/12/2008   HYPERCHOLESTEROLEMIA 01/01/2007   PANIC DISORDER 01/01/2007   Obsessive compulsive disorder 01/01/2007    Past Surgical History:  Procedure Laterality Date   CESAREAN SECTION  05/1998   X 1   CHOLECYSTECTOMY N/A 03/27/2016   Procedure: LAPAROSCOPIC CHOLECYSTECTOMY;  Surgeon: Olean Ree, MD;  Location: [facility] ORS;  Service: General;  Laterality: N/A;   TONSILLECTOMY AND ADENOIDECTOMY     age 36   TUBAL LIGATION       OB History   No obstetric history on file.     Family History  Problem Relation Age of Onset   Diabetes Mother    Heart disease Mother    Kidney disease Mother    Stroke Mother    Hyperlipidemia Mother    Diabetes Maternal Aunt     Social History   Tobacco Use   Smoking status: Former    Types: Cigarettes    Quit date: 03/14/2013    Years since quitting: 7.6   Smokeless tobacco: Never   Tobacco comments:    1 cigarette1-2 times year, only when she gets stressed  Vaping Use   Vaping Use: Never used  Substance Use Topics   Alcohol use: Yes    Alcohol/week: 1.0 standard drink    Types: 1 Cans of beer per week    Comment: One can of beer every 2-3 months.    Drug use: No    Home Medications Prior to Admission medications   Medication Sig Start Date End Date Taking? Authorizing Provider  amLODipine (NORVASC) 10  MG tablet Take 1 tablet (10 mg total) by mouth daily. 09/06/20   Shawna Clamp, MD  Blood Glucose Monitoring Suppl (TRUE METRIX METER) w/Device KIT 1 Device by Does not apply route 3 (three) times daily. For blood sugar checks 09/13/18   Barb Merino, MD  busPIRone (BUSPAR) 5 MG tablet Take 1 tablet (5 mg total) by mouth 2 (two) times daily. 09/05/20   Shawna Clamp, MD  carvedilol (COREG) 3.125 MG tablet Take 1 tablet (3.125 mg total) by mouth 2 (two) times daily with a meal. 09/05/20   Shawna Clamp, MD  gabapentin (NEURONTIN) 100 MG capsule Take 1 capsule (100 mg total) by mouth 3 (three) times daily. Patient not  taking: No sig reported 09/05/20 12/04/20  Shawna Clamp, MD  glipiZIDE (GLUCOTROL) 10 MG tablet Take 1 tablet (10 mg total) by mouth 2 (two) times daily before a meal. 09/05/20 10/05/20  Shawna Clamp, MD  glucose blood test strip Use as instructed 10/28/18   Argentina Donovan, PA-C  hydrALAZINE (APRESOLINE) 25 MG tablet Take 1 tablet (25 mg total) by mouth 3 (three) times daily. Patient not taking: No sig reported 09/05/20   Shawna Clamp, MD  insulin glargine (LANTUS) 100 UNIT/ML injection Inject 0.15 mLs (15 Units total) into the skin at bedtime. 09/05/20   Shawna Clamp, MD  Insulin Syringe-Needle U-100 (SAFETY INSULIN SYRINGES) 30G X 5/16" 0.5 ML MISC 1 each by Does not apply route at bedtime. 10/21/19   Argentina Donovan, PA-C  QUEtiapine (SEROQUEL) 100 MG tablet Take 1 tablet (100 mg total) by mouth at bedtime. For mood control Patient not taking: No sig reported 09/05/20   Shawna Clamp, MD  sertraline (ZOLOFT) 50 MG tablet Take 1 tablet (50 mg total) by mouth daily. 09/06/20   Shawna Clamp, MD  TRUEplus Lancets 28G MISC 1 each by Does not apply route 3 (three) times daily. For blood sugar checks 09/13/18   Barb Merino, MD    Allergies    Lisinopril  Review of Systems   Review of Systems  Constitutional:  Positive for appetite change and fatigue. Negative for chills and fever.  HENT:  Negative for congestion.   Eyes:  Negative for visual disturbance.  Respiratory:  Negative for shortness of breath.   Cardiovascular:  Negative for chest pain.  Gastrointestinal:  Positive for abdominal pain and diarrhea. Negative for blood in stool and vomiting.  Genitourinary:  Negative for dysuria.  Skin:  Negative for rash.  Neurological:  Negative for headaches.   Physical Exam Updated Vital Signs BP (!) 164/79 (BP Location: Right Arm)   Pulse 71   Temp 98.2 F (36.8 C) (Oral)   Resp 16   SpO2 100%   Physical Exam Vitals and nursing note reviewed.  Constitutional:      General: She is  not in acute distress.    Appearance: Normal appearance. She is not ill-appearing.  HENT:     Head: Normocephalic.     Mouth/Throat:     Mouth: Mucous membranes are moist.  Cardiovascular:     Rate and Rhythm: Normal rate.  Pulmonary:     [place]: Pulmonary effort is normal. No respiratory distress.  Abdominal:     Palpations: Abdomen is soft.     Tenderness: There is no abdominal tenderness. There is no guarding or rebound.  Skin:    General: Skin is warm.  Neurological:     Mental Status: She is alert and oriented to person, place, and time. Mental status is at  baseline.  Psychiatric:        Mood and Affect: Mood normal.    ED Results / Procedures / Treatments   Labs (all labs ordered are listed, but only abnormal results are displayed) Labs Reviewed  CBC WITH DIFFERENTIAL/PLATELET - Abnormal; Notable for the following components:      Result Value   RBC 3.48 (*)    Hemoglobin 10.4 (*)    HCT 31.9 (*)    All other components within normal limits  COMPREHENSIVE METABOLIC PANEL - Abnormal; Notable for the following components:   CO2 21 (*)    Glucose, Bld 225 (*)    BUN 43 (*)    Creatinine, Ser 3.64 (*)    GFR, Estimated 14 (*)    All other components within normal limits  URINALYSIS, ROUTINE W REFLEX MICROSCOPIC - Abnormal; Notable for the following components:   Hgb urine dipstick MODERATE (*)    Protein, ur 100 (*)    Leukocytes,Ua MODERATE (*)    All other components within normal limits  URINALYSIS, MICROSCOPIC (REFLEX) - Abnormal; Notable for the following components:   Bacteria, UA MANY (*)    All other components within normal limits  LIPASE, BLOOD    EKG None  Radiology No results found.  Procedures Procedures   Medications Ordered in ED Medications - No data to display  ED Course  I have reviewed the triage vital signs and the nursing notes.  Pertinent labs & imaging results that were available during my care of the patient were reviewed by me  and considered in my medical decision making (see chart for details).    MDM Rules/Calculators/A&P                           60 year old female presents emergency department with abdominal cramping and diarrhea.  This has resolved on arrival.  No continuing symptoms.  Vitals are stable on arrival.  Blood work is reassuring, kidney dysfunction is around her baseline CKD.  Otherwise no acute findings in the blood work.  Patient was allowed to eat and drink.  She is demonstrated that she can orally rehydrate.  She has been able to ambulate and use the restroom without any difficulty.  With a benign abdominal exam and no ongoing symptoms/reassuring labs I do not feel emergent CT imaging is warranted at this time.  Patient at this time appears safe and stable for discharge and will be treated as an outpatient.  Discharge plan and strict return to ED precautions discussed, patient verbalizes understanding and agreement.  Final Clinical Impression(s) / ED Diagnoses Final diagnoses:  None    Rx / DC Orders ED Discharge Orders     None        Lorelle Gibbs, DO 11/09/20 1653

## 2020-11-09 NOTE — ED Provider Notes (Signed)
Emergency Medicine Provider Triage Evaluation Note  Lorraine Turner , a 60 y.o. female  was evaluated in triage.  Pt complains of abdominal pain and diarrhea.  Onset Tuesday after eating a ham sandwich.  Improved yesterday after taking Pepto-Bismol.  Then ate a hot dog with chili and onions and had return of her diarrhea.  Patient is homeless, is very distraught that she has misplaced a backpack with all of her documents.  History of diabetes, noncompliant with medications.  Review of Systems  Positive: Abdominal pain, diarrhea Negative: Blood in stools, fever  Physical Exam  BP (!) 175/69 (BP Location: Right Arm)   Pulse 91   Temp 98.2 F (36.8 C) (Oral)   Resp 18   SpO2 100%  Gen:   Awake, no distress   Resp:  Normal effort  MSK:   Moves extremities without difficulty  Other:  Mild generalized abdominal tenderness  Medical Decision Making  Medically screening exam initiated at 8:39 AM.  Appropriate orders placed.  STARRLA METOXEN was informed that the remainder of the evaluation will be completed by another provider, this initial triage assessment does not replace that evaluation, and the importance of remaining in the ED until their evaluation is complete.     Tacy Learn, PA-C 11/09/20 0840    Lucrezia Starch, MD 11/09/20 [phone]

## 2020-11-16 ENCOUNTER — Encounter (HOSPITAL_COMMUNITY): Payer: Self-pay

## 2020-11-16 ENCOUNTER — Emergency Department (HOSPITAL_COMMUNITY)
Admission: EM | Admit: 2020-11-16 | Discharge: 2020-11-18 | Disposition: A | Discharge status: Home / Self Care | Payer: Medicaid Other | Attending: Emergency Medicine | Admitting: Emergency Medicine

## 2020-11-16 ENCOUNTER — Emergency Department (HOSPITAL_COMMUNITY): Payer: Medicaid Other

## 2020-11-16 DIAGNOSIS — E1122 Type 2 diabetes mellitus with diabetic chronic kidney disease: Secondary | ICD-10-CM | POA: Insufficient documentation

## 2020-11-16 DIAGNOSIS — I129 Hypertensive chronic kidney disease with stage 1 through stage 4 chronic kidney disease, or unspecified chronic kidney disease: Secondary | ICD-10-CM | POA: Insufficient documentation

## 2020-11-16 DIAGNOSIS — F209 Schizophrenia, unspecified: Secondary | ICD-10-CM | POA: Insufficient documentation

## 2020-11-16 DIAGNOSIS — Z1339 Encounter for screening examination for other mental health and behavioral disorders: Secondary | ICD-10-CM | POA: Diagnosis not present

## 2020-11-16 DIAGNOSIS — Z79899 Other long term (current) drug therapy: Secondary | ICD-10-CM | POA: Insufficient documentation

## 2020-11-16 DIAGNOSIS — Z794 Long term (current) use of insulin: Secondary | ICD-10-CM | POA: Insufficient documentation

## 2020-11-16 DIAGNOSIS — Z7984 Long term (current) use of oral hypoglycemic drugs: Secondary | ICD-10-CM | POA: Diagnosis not present

## 2020-11-16 DIAGNOSIS — R45851 Suicidal ideations: Secondary | ICD-10-CM | POA: Insufficient documentation

## 2020-11-16 DIAGNOSIS — F29 Unspecified psychosis not due to a substance or known physiological condition: Secondary | ICD-10-CM | POA: Insufficient documentation

## 2020-11-16 DIAGNOSIS — Z20822 Contact with and (suspected) exposure to covid-19: Secondary | ICD-10-CM | POA: Diagnosis not present

## 2020-11-16 DIAGNOSIS — N9489 Other specified conditions associated with female genital organs and menstrual cycle: Secondary | ICD-10-CM | POA: Diagnosis not present

## 2020-11-16 DIAGNOSIS — F410 Panic disorder [episodic paroxysmal anxiety] without agoraphobia: Secondary | ICD-10-CM | POA: Diagnosis not present

## 2020-11-16 DIAGNOSIS — Z87891 Personal history of nicotine dependence: Secondary | ICD-10-CM | POA: Diagnosis not present

## 2020-11-16 DIAGNOSIS — R0602 Shortness of breath: Secondary | ICD-10-CM | POA: Diagnosis not present

## 2020-11-16 DIAGNOSIS — N183 Chronic kidney disease, stage 3 unspecified: Secondary | ICD-10-CM | POA: Diagnosis not present

## 2020-11-16 DIAGNOSIS — R079 Chest pain, unspecified: Secondary | ICD-10-CM | POA: Insufficient documentation

## 2020-11-16 LAB — ETHANOL: Alcohol, Ethyl (B): <10 mg/dL (ref <10)

## 2020-11-16 LAB — CBC WITH DIFFERENTIAL/PLATELET
Abs Immature Granulocytes: 0.02 10*3/uL (ref 0.00–0.07)
Basophils Absolute: 0 10*3/uL (ref 0.0–0.1)
Basophils Relative: 1 %
Eosinophils Absolute: 0.2 10*3/uL (ref 0.0–0.5)
Eosinophils Relative: 2 %
HCT: 31.8 % — ABNORMAL LOW (ref 36.0–46.0)
Hemoglobin: 10.1 g/dL — ABNORMAL LOW (ref 12.0–15.0)
Immature Granulocytes: 0 %
Lymphocytes Relative: 31 %
Lymphs Abs: 2.6 10*3/uL (ref 0.7–4.0)
MCH: 29.3 pg (ref 26.0–34.0)
MCHC: 31.8 g/dL (ref 30.0–36.0)
MCV: 92.2 fL (ref 80.0–100.0)
Monocytes Absolute: 0.7 10*3/uL (ref 0.1–1.0)
Monocytes Relative: 8 %
Neutro Abs: 4.9 10*3/uL (ref 1.7–7.7)
Neutrophils Relative %: 58 %
Platelets: 351 10*3/uL (ref 150–400)
RBC: 3.45 MIL/uL — ABNORMAL LOW (ref 3.87–5.11)
RDW: 13 % (ref 11.5–15.5)
WBC: 8.5 10*3/uL (ref 4.0–10.5)
nRBC: 0 % (ref 0.0–0.2)

## 2020-11-16 LAB — COMPREHENSIVE METABOLIC PANEL
ALT: 13 U/L (ref 0–44)
AST: 13 U/L — ABNORMAL LOW (ref 15–41)
Albumin: 3.6 g/dL (ref 3.5–5.0)
Alkaline Phosphatase: 69 U/L (ref 38–126)
Anion gap: 7 (ref 5–15)
BUN: 32 mg/dL — ABNORMAL HIGH (ref 6–20)
CO2: 27 mmol/L (ref 22–32)
Calcium: 9.2 mg/dL (ref 8.9–10.3)
Chloride: 106 mmol/L (ref 98–111)
Creatinine, Ser: 3.28 mg/dL — ABNORMAL HIGH (ref 0.44–1.00)
GFR, Estimated: 16 mL/min — ABNORMAL LOW (ref >60)
Glucose, Bld: 142 mg/dL — ABNORMAL HIGH (ref 70–99)
Potassium: 4.4 mmol/L (ref 3.5–5.1)
Sodium: 140 mmol/L (ref 135–145)
Total Bilirubin: 1 mg/dL (ref 0.3–1.2)
Total Protein: 7.5 g/dL (ref 6.5–8.1)

## 2020-11-16 LAB — I-STAT BETA HCG BLOOD, ED (MC, WL, AP ONLY): I-stat hCG, quantitative: <5 m[IU]/mL (ref <5)

## 2020-11-16 LAB — RAPID URINE DRUG SCREEN, HOSP PERFORMED
Amphetamines: NOT DETECTED
Barbiturates: NOT DETECTED
Benzodiazepines: NOT DETECTED
Cocaine: NOT DETECTED
Opiates: NOT DETECTED
Tetrahydrocannabinol: NOT DETECTED

## 2020-11-16 NOTE — ED Provider Notes (Signed)
Emergency Medicine Provider Triage Evaluation Note  Lorraine Turner , a 60 y.o. female  was evaluated in triage.  pt complains of suicidal ideations.  Patient was referred over from [facility] because patient complaining of chest pain.  Patient reports she gets this chest pain with her panic attacks and is currently chest pain-free.  She reports noncompliance with her home medications.  She is without complaints at this time other than suicidal ideations.  Reports she is thought about jumping in front of traffic or jumping out of a building  Review of Systems  Positive: Suicidal ideations Negative: Chest pain, shortness of breath, headache  Physical Exam  BP (!) 173/68 (BP Location: Right Arm)   Pulse 62   Temp 98.8 F (37.1 C) (Oral)   Resp 16   SpO2 100%  Gen:   Awake, no distress   Resp:  Normal effort  MSK:   Moves extremities without difficulty  Other:    Medical Decision Making  Medically screening exam initiated at 7:06 PM.  Appropriate orders placed.  Lorraine Turner was informed that the remainder of the evaluation will be completed by another provider, this initial triage assessment does not replace that evaluation, and the importance of remaining in the ED until their evaluation is complete.  Suicidal ideations   Lorraine Turner, Lorraine Turner 11/16/20 1910    Teressa Lower, MD 11/16/20 [phone]

## 2020-11-16 NOTE — ED Triage Notes (Signed)
Pt from [facility] with ems under IVC. Pt c.o chest pain. Pt homeless.  SI.  BP 180/92 Hr60s CBG 111 98% room air

## 2020-11-17 LAB — CBG MONITORING, ED
Glucose-Capillary: 288 mg/dL — ABNORMAL HIGH (ref 70–99)
Glucose-Capillary: 203 mg/dL — ABNORMAL HIGH (ref 70–99)

## 2020-11-17 LAB — RESP PANEL BY RT-PCR (FLU A&B, COVID) ARPGX2
Influenza A by PCR: NEGATIVE
Influenza B by PCR: NEGATIVE
SARS Coronavirus 2 by RT PCR: NEGATIVE

## 2020-11-17 MED ORDER — OLANZAPINE 5 MG PO TBDP
5.0000 mg | ORAL_TABLET | Freq: Once | ORAL | Status: AC
  Administered 2020-11-17: 5 mg via ORAL
  Filled 2020-11-17: qty 1

## 2020-11-17 MED ORDER — INSULIN GLARGINE-YFGN 100 UNIT/ML ~~LOC~~ SOLN: 15.0000 [IU] | Freq: Every day | SUBCUTANEOUS | Status: DC

## 2020-11-17 MED ORDER — GLIPIZIDE 10 MG PO TABS: 10.0000 mg | ORAL_TABLET | Freq: Two times a day (BID) | ORAL | Status: DC

## 2020-11-17 MED ORDER — GLIPIZIDE 10 MG PO TABS
10.0000 mg | ORAL_TABLET | Freq: Two times a day (BID) | ORAL | Status: DC
Start: 2020-11-18 — End: 2020-11-18
  Filled 2020-11-17: qty 1

## 2020-11-17 MED ORDER — AMLODIPINE BESYLATE 5 MG PO TABS
10.0000 mg | ORAL_TABLET | Freq: Every day | ORAL | Status: DC
Start: 2020-11-17 — End: 2020-11-18
  Administered 2020-11-17: 10 mg via ORAL
  Filled 2020-11-17: qty 2

## 2020-11-17 MED ORDER — INSULIN GLARGINE-YFGN 100 UNIT/ML ~~LOC~~ SOLN
15.0000 [IU] | Freq: Every day | SUBCUTANEOUS | Status: DC
  Administered 2020-11-17: 15 [IU] via SUBCUTANEOUS
  Filled 2020-11-17: qty 0.15

## 2020-11-17 MED ORDER — CARVEDILOL 3.125 MG PO TABS
3.1250 mg | ORAL_TABLET | Freq: Two times a day (BID) | ORAL | Status: DC
  Administered 2020-11-17: 3.125 mg via ORAL
  Filled 2020-11-17 (×3): qty 1

## 2020-11-17 NOTE — ED Notes (Signed)
Pt ambulatory to restroom with independent steady gait °

## 2020-11-17 NOTE — Progress Notes (Addendum)
Pt was accepted to [facility] today 11/17/20 after 11:30pm to Room 300-1.   Pt meets inpatient criteria per [place]  Attending Physician will be Dr. Nelda Marseille  Report can be called to:  Adult unit: [phone]  Pt can arrive after 11:30pm  Care Team notified via secure chat:  Orland Dec, RN, Clayborne Dana, RN, Quintella Reichert, NP, and Dene Gentry, MD.  Nadara Mode, [place] 11/17/2020 @ 8:20 PM
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
## 2020-11-17 NOTE — ED Notes (Signed)
ED Provider at bedside. 

## 2020-11-17 NOTE — ED Provider Notes (Signed)
[facility] EMERGENCY DEPARTMENT Provider Note   CSN: [id number] Arrival date & time: 11/16/20  1854     History Chief Complaint  Patient presents with   Psychiatric Evaluation   Chest Pain    Lorraine Turner is a 60 y.o. female.  60 year old female with history of DM, depression, HTN, hyperlipidemia, schizophrenia, anxiety, brought in by EMS for suicidal ideation.  Patient states that she has been feeling suicidal for the past month ever since she was evicted from her home.  Patient is living in [place], states there is a parking garage next-door and she has contemplated climbing to the tablet parking garage and jumping.  Patient has not done this because she states she wants to go to heaven and she like to think that God has a plan for her.  Patient has 5 children and several grandchildren, her daughter and her 3 children were living with the patient at the time she was evicted which resulted in a CPS case (states patient's daughter and her children were not to be living with her).  Patient denies use of drugs, does report social drinking around the holidays.  States that she hears voices at times.  Reports 1 prior suicide attempt when she stepped out into traffic on [address], states that the voices were very loud that time.  Patient came in by EMS, called a crisis line to report that she was having chest pain or shortness of breath which she states are her panic attack symptoms.  Symptoms resolved upon EMS arrival and she has been pain-free for 17 hours while in the emergency room. No other complaints or concerns.       Past Medical History:  Diagnosis Date   Anxiety    Bipolar 1 disorder ([place])    Depression    Diabetes mellitus without complication ([place])    Gallstones    Hyperlipidemia    Hypertension    Neuropathy    Schizophrenia ([place])    Seizures ([place])    X1- febrile seizure as a child-none since    Patient Active Problem List   Diagnosis Date Noted    Acute worsening of stage 4 chronic kidney disease ([place])    Hypertensive urgency 09/01/2020   CKD (chronic kidney disease), stage III ([place]) 09/01/2020   DKA (diabetic ketoacidoses) 09/12/2018   Suicide ideation 09/12/2018   AKI (acute kidney injury) ([place]) 09/12/2018   Adjustment disorder with mixed disturbance of emotions and conduct    MDD (major depressive disorder), severe ([place]) 63/14/9702   Umbilical hernia with obstruction 11/24/2015   Symptomatic cholelithiasis 11/24/2015   Type 2 diabetes mellitus with hyperglycemia ([place])    Suicide attempt ([place]) 05/06/2014   Schizoaffective disorder, depressive type ([place]) 08/14/2011   UNSPECIFIED ANEMIA 01/23/2009   Primary hypertension 10/12/2008   HYPERCHOLESTEROLEMIA 01/01/2007   PANIC DISORDER 01/01/2007   Obsessive compulsive disorder 01/01/2007    Past Surgical History:  Procedure Laterality Date   CESAREAN SECTION  05/1998   X 1   CHOLECYSTECTOMY N/A 03/27/2016   Procedure: LAPAROSCOPIC CHOLECYSTECTOMY;  Surgeon: Olean Ree, MD;  Location: [facility] ORS;  Service: General;  Laterality: N/A;   TONSILLECTOMY AND ADENOIDECTOMY     age 51   TUBAL LIGATION       OB History   No obstetric history on file.     Family History  Problem Relation Age of Onset   Diabetes Mother    Heart disease Mother    Kidney disease Mother  Stroke Mother    Hyperlipidemia Mother    Diabetes Maternal Aunt     Social History   Tobacco Use   Smoking status: Former    Types: Cigarettes    Quit date: 03/14/2013    Years since quitting: 7.6   Smokeless tobacco: Never   Tobacco comments:    1 cigarette1-2 times year, only when she gets stressed  Vaping Use   Vaping Use: Never used  Substance Use Topics   Alcohol use: Yes    Alcohol/week: 1.0 standard drink    Types: 1 Cans of beer per week    Comment: One can of beer every 2-3 months.    Drug use: No    Home Medications Prior to Admission medications   Medication Sig Start Date End Date  Taking? Authorizing Provider  amLODipine (NORVASC) 10 MG tablet Take 1 tablet (10 mg total) by mouth daily. 09/06/20   Shawna Clamp, MD  Blood Glucose Monitoring Suppl (TRUE METRIX METER) w/Device KIT 1 Device by Does not apply route 3 (three) times daily. For blood sugar checks 09/13/18   Barb Merino, MD  busPIRone (BUSPAR) 5 MG tablet Take 1 tablet (5 mg total) by mouth 2 (two) times daily. 09/05/20   Shawna Clamp, MD  carvedilol (COREG) 3.125 MG tablet Take 1 tablet (3.125 mg total) by mouth 2 (two) times daily with a meal. 09/05/20   Shawna Clamp, MD  gabapentin (NEURONTIN) 100 MG capsule Take 1 capsule (100 mg total) by mouth 3 (three) times daily. Patient not taking: No sig reported 09/05/20 12/04/20  Shawna Clamp, MD  glipiZIDE (GLUCOTROL) 10 MG tablet Take 1 tablet (10 mg total) by mouth 2 (two) times daily before a meal. 09/05/20 10/05/20  Shawna Clamp, MD  glucose blood test strip Use as instructed 10/28/18   Argentina Donovan, PA-C  hydrALAZINE (APRESOLINE) 25 MG tablet Take 1 tablet (25 mg total) by mouth 3 (three) times daily. Patient not taking: No sig reported 09/05/20   Shawna Clamp, MD  insulin glargine (LANTUS) 100 UNIT/ML injection Inject 0.15 mLs (15 Units total) into the skin at bedtime. 09/05/20   Shawna Clamp, MD  Insulin Syringe-Needle U-100 (SAFETY INSULIN SYRINGES) 30G X 5/16" 0.5 ML MISC 1 each by Does not apply route at bedtime. 10/21/19   Argentina Donovan, PA-C  QUEtiapine (SEROQUEL) 100 MG tablet Take 1 tablet (100 mg total) by mouth at bedtime. For mood control Patient not taking: No sig reported 09/05/20   Shawna Clamp, MD  sertraline (ZOLOFT) 50 MG tablet Take 1 tablet (50 mg total) by mouth daily. 09/06/20   Shawna Clamp, MD  TRUEplus Lancets 28G MISC 1 each by Does not apply route 3 (three) times daily. For blood sugar checks 09/13/18   Barb Merino, MD    Allergies    Lisinopril  Review of Systems   Review of Systems  Constitutional:  Negative for  fever.  Respiratory:  Negative for shortness of breath.   Cardiovascular:  Negative for chest pain.  Gastrointestinal:  Negative for abdominal pain, nausea and vomiting.  Genitourinary:  Negative for dysuria.  Musculoskeletal:  Negative for arthralgias and myalgias.  Skin:  Negative for rash and wound.  Allergic/Immunologic: Positive for immunocompromised state.  Neurological:  Negative for weakness.  Psychiatric/Behavioral:  Positive for hallucinations and suicidal ideas. Negative for confusion.   All other systems reviewed and are negative.  Physical Exam Updated Vital Signs BP (S) (!) 182/63 (BP Location: Right Arm) Comment: provider notified of elevated  BP  Pulse 64   Temp 98.7 F (37.1 C) (Oral)   Resp 18   SpO2 98%   Physical Exam Vitals and nursing note reviewed.  Constitutional:      General: She is not in acute distress.    Appearance: She is well-developed. She is not diaphoretic.  HENT:     Head: Normocephalic and atraumatic.  Cardiovascular:     Rate and Rhythm: Normal rate and regular rhythm.     Heart sounds: Normal heart sounds. No murmur heard. Pulmonary:     Effort: Pulmonary effort is normal.     Breath sounds: Normal breath sounds.  Abdominal:     Palpations: Abdomen is soft.     Tenderness: There is no abdominal tenderness.  Musculoskeletal:     Right lower leg: No edema.     Left lower leg: No edema.  Skin:    General: Skin is warm and dry.     Findings: No erythema or rash.  Neurological:     Mental Status: She is alert and oriented to person, place, and time.  Psychiatric:        Mood and Affect: Mood and affect normal.        Behavior: Behavior normal.        Thought Content: Thought content is not paranoid. Thought content includes suicidal ideation. Thought content does not include homicidal ideation. Thought content includes suicidal plan.    ED Results / Procedures / Treatments   Labs (all labs ordered are listed, but only abnormal  results are displayed) Labs Reviewed  COMPREHENSIVE METABOLIC PANEL - Abnormal; Notable for the following components:      Result Value   Glucose, Bld 142 (*)    BUN 32 (*)    Creatinine, Ser 3.28 (*)    AST 13 (*)    GFR, Estimated 16 (*)    All other components within normal limits  CBC WITH DIFFERENTIAL/PLATELET - Abnormal; Notable for the following components:   RBC 3.45 (*)    Hemoglobin 10.1 (*)    HCT 31.8 (*)    All other components within normal limits  RESP PANEL BY RT-PCR (FLU A&B, COVID) ARPGX2  ETHANOL  RAPID URINE DRUG SCREEN, HOSP PERFORMED  I-STAT BETA HCG BLOOD, ED ([facility], [facility], [facility] ONLY)    EKG None  Radiology DG Chest 2 View  Result Date: 11/16/2020 CLINICAL DATA:  Chest pain. EXAM: CHEST - 2 VIEW COMPARISON:  March 02, 2018 FINDINGS: The heart size and mediastinal contours are within normal limits. Both lungs are clear. Radiopaque surgical clips are seen within the right upper quadrant. Degenerative changes are seen within the thoracic spine. IMPRESSION: No active cardiopulmonary disease. Electronically Signed   By: Virgina Norfolk M.D.   On: 11/16/2020 20:16    Procedures Procedures   Medications Ordered in ED Medications  amLODipine (NORVASC) tablet 10 mg (10 mg Oral Given 11/17/20 1244)  carvedilol (COREG) tablet 3.125 mg (has no administration in time range)  glipiZIDE (GLUCOTROL) tablet 10 mg (has no administration in time range)  insulin glargine-yfgn (SEMGLEE) injection 15 Units (has no administration in time range)  OLANZapine zydis (ZYPREXA) disintegrating tablet 5 mg (5 mg Oral Given 11/17/20 1244)    ED Course  I have reviewed the triage vital signs and the nursing notes.  Pertinent labs & imaging results that were available during my care of the patient were reviewed by me and considered in my medical decision making (see chart for details).  Clinical  Course as of 11/17/20 1434  Fri Nov 18, 3726  [id number] 60 year old female with history of  schizophrenia, bipolar disorder and anxiety as well as depression brought in by EMS under IVC from behind for suicidal ideation with plan to jump [place].  Patient has no medical complaints at this time.  Labs reviewed.  Creatinine is 3.28 today, not significantly changed when compared to prior on file.  Hemoglobin is 10.1, not significantly changed compared to prior on file.  Alcohol negative, not pregnant.  Urine drug screen negative, COVID and flu negative.  CBG monitoring ordered as well as patient's blood pressure medication and diabetes medications. Patient is medically cleared for behavioral health evaluation disposition. [LM]    Clinical Course User Index [LM] Roque Lias   MDM Rules/Calculators/A&P                           Final Clinical Impression(s) / ED Diagnoses Final diagnoses:  Psychosis, unspecified psychosis type [facility])    Rx / DC Orders ED Discharge Orders     None        Tacy Learn, PA-C 11/17/20 1434    Gareth Morgan, MD 11/18/20 0830

## 2020-11-17 NOTE — ED Notes (Signed)
Pt in room 50, pt has a ring on her hand that can't be removed, security at bedside to wand the pt.  Pt has been wanded and back to bed

## 2020-11-17 NOTE — ED Notes (Signed)
Pt in room 50, pt states that she is here for si, pt contracted for safety, pt has meal tray, pt has a large cart of belonging, inventoried and stored.  Pt sitting up in bed watching tv.

## 2020-11-17 NOTE — ED Notes (Signed)
TTS talking with pt

## 2020-11-17 NOTE — ED Notes (Signed)
Received care of patient

## 2020-11-18 ENCOUNTER — Encounter (HOSPITAL_COMMUNITY): Payer: Self-pay | Admitting: Adult Health

## 2020-11-18 ENCOUNTER — Inpatient Hospital Stay (HOSPITAL_COMMUNITY)
Admission: AD | Admit: 2020-11-18 | Discharge: 2020-11-24 | DRG: 885 | Disposition: A | Discharge status: Other Acute Inpatient Hospital | Payer: Medicaid Other | Source: Intra-hospital | Attending: Internal Medicine | Admitting: Internal Medicine

## 2020-11-18 ENCOUNTER — Other Ambulatory Visit: Payer: Self-pay

## 2020-11-18 DIAGNOSIS — N390 Urinary tract infection, site not specified: Secondary | ICD-10-CM | POA: Diagnosis present

## 2020-11-18 DIAGNOSIS — F319 Bipolar disorder, unspecified: Secondary | ICD-10-CM | POA: Diagnosis present

## 2020-11-18 DIAGNOSIS — Z79899 Other long term (current) drug therapy: Secondary | ICD-10-CM

## 2020-11-18 DIAGNOSIS — Z83438 Family history of other disorder of lipoprotein metabolism and other lipidemia: Secondary | ICD-10-CM | POA: Diagnosis not present

## 2020-11-18 DIAGNOSIS — I129 Hypertensive chronic kidney disease with stage 1 through stage 4 chronic kidney disease, or unspecified chronic kidney disease: Secondary | ICD-10-CM | POA: Diagnosis present

## 2020-11-18 DIAGNOSIS — Z20822 Contact with and (suspected) exposure to covid-19: Secondary | ICD-10-CM | POA: Diagnosis present

## 2020-11-18 DIAGNOSIS — N3000 Acute cystitis without hematuria: Secondary | ICD-10-CM

## 2020-11-18 DIAGNOSIS — R159 Full incontinence of feces: Secondary | ICD-10-CM | POA: Diagnosis present

## 2020-11-18 DIAGNOSIS — Z794 Long term (current) use of insulin: Secondary | ICD-10-CM

## 2020-11-18 DIAGNOSIS — G4710 Hypersomnia, unspecified: Secondary | ICD-10-CM | POA: Diagnosis present

## 2020-11-18 DIAGNOSIS — R197 Diarrhea, unspecified: Secondary | ICD-10-CM | POA: Diagnosis present

## 2020-11-18 DIAGNOSIS — F251 Schizoaffective disorder, depressive type: Secondary | ICD-10-CM | POA: Diagnosis present

## 2020-11-18 DIAGNOSIS — Z9151 Personal history of suicidal behavior: Secondary | ICD-10-CM

## 2020-11-18 DIAGNOSIS — N179 Acute kidney failure, unspecified: Secondary | ICD-10-CM | POA: Diagnosis present

## 2020-11-18 DIAGNOSIS — Z9049 Acquired absence of other specified parts of digestive tract: Secondary | ICD-10-CM

## 2020-11-18 DIAGNOSIS — E739 Lactose intolerance, unspecified: Secondary | ICD-10-CM | POA: Diagnosis present

## 2020-11-18 DIAGNOSIS — Z8249 Family history of ischemic heart disease and other diseases of the circulatory system: Secondary | ICD-10-CM

## 2020-11-18 DIAGNOSIS — Z87891 Personal history of nicotine dependence: Secondary | ICD-10-CM | POA: Diagnosis not present

## 2020-11-18 DIAGNOSIS — N183 Chronic kidney disease, stage 3 unspecified: Secondary | ICD-10-CM | POA: Diagnosis present

## 2020-11-18 DIAGNOSIS — E1165 Type 2 diabetes mellitus with hyperglycemia: Secondary | ICD-10-CM | POA: Diagnosis present

## 2020-11-18 DIAGNOSIS — I10 Essential (primary) hypertension: Secondary | ICD-10-CM | POA: Diagnosis not present

## 2020-11-18 DIAGNOSIS — Z833 Family history of diabetes mellitus: Secondary | ICD-10-CM

## 2020-11-18 DIAGNOSIS — Z9851 Tubal ligation status: Secondary | ICD-10-CM

## 2020-11-18 DIAGNOSIS — F331 Major depressive disorder, recurrent, moderate: Secondary | ICD-10-CM

## 2020-11-18 DIAGNOSIS — E1122 Type 2 diabetes mellitus with diabetic chronic kidney disease: Secondary | ICD-10-CM | POA: Diagnosis present

## 2020-11-18 DIAGNOSIS — R45851 Suicidal ideations: Secondary | ICD-10-CM | POA: Diagnosis present

## 2020-11-18 DIAGNOSIS — Z841 Family history of disorders of kidney and ureter: Secondary | ICD-10-CM

## 2020-11-18 DIAGNOSIS — F429 Obsessive-compulsive disorder, unspecified: Secondary | ICD-10-CM | POA: Diagnosis present

## 2020-11-18 DIAGNOSIS — N184 Chronic kidney disease, stage 4 (severe): Secondary | ICD-10-CM | POA: Diagnosis not present

## 2020-11-18 DIAGNOSIS — F32A Depression, unspecified: Secondary | ICD-10-CM | POA: Diagnosis present

## 2020-11-18 DIAGNOSIS — F419 Anxiety disorder, unspecified: Secondary | ICD-10-CM | POA: Diagnosis present

## 2020-11-18 DIAGNOSIS — E785 Hyperlipidemia, unspecified: Secondary | ICD-10-CM | POA: Diagnosis present

## 2020-11-18 HISTORY — DX: Major depressive disorder, recurrent, moderate: F33.1

## 2020-11-18 LAB — LIPID PANEL
Cholesterol: 197 mg/dL (ref 0–200)
HDL: 49 mg/dL (ref >40)
LDL Cholesterol: 119 mg/dL — ABNORMAL HIGH (ref 0–99)
Total CHOL/HDL Ratio: 4 RATIO
Triglycerides: 144 mg/dL (ref <150)
VLDL: 29 mg/dL (ref 0–40)

## 2020-11-18 LAB — GLUCOSE, CAPILLARY
Glucose-Capillary: 156 mg/dL — ABNORMAL HIGH (ref 70–99)
Glucose-Capillary: 138 mg/dL — ABNORMAL HIGH (ref 70–99)
Glucose-Capillary: 209 mg/dL — ABNORMAL HIGH (ref 70–99)
Glucose-Capillary: 213 mg/dL — ABNORMAL HIGH (ref 70–99)
Glucose-Capillary: 232 mg/dL — ABNORMAL HIGH (ref 70–99)

## 2020-11-18 LAB — TSH: TSH: 2.28 u[IU]/mL (ref 0.350–4.500)

## 2020-11-18 MED ORDER — INSULIN GLARGINE-YFGN 100 UNIT/ML ~~LOC~~ SOLN
15.0000 [IU] | Freq: Every day | SUBCUTANEOUS | Status: DC
  Administered 2020-11-18 – 2020-11-21 (×4): 15 [IU] via SUBCUTANEOUS
  Filled 2020-11-18 (×7): qty 0.15

## 2020-11-18 MED ORDER — ALUM & MAG HYDROXIDE-SIMETH 200-200-20 MG/5ML PO SUSP
30.0000 mL | ORAL | Status: DC | PRN
Start: 2020-11-18 — End: 2020-11-23
  Filled 2020-11-18: qty 30

## 2020-11-18 MED ORDER — AMLODIPINE BESYLATE 10 MG PO TABS
10.0000 mg | ORAL_TABLET | Freq: Every day | ORAL | Status: DC
Start: 2020-11-18 — End: 2020-11-19
  Administered 2020-11-18: 10 mg via ORAL
  Filled 2020-11-18 (×3): qty 1
  Filled 2020-11-18: qty 2

## 2020-11-18 MED ORDER — CARVEDILOL 3.125 MG PO TABS
3.1250 mg | ORAL_TABLET | Freq: Two times a day (BID) | ORAL | Status: DC
  Administered 2020-11-18 – 2020-11-23 (×9): 3.125 mg via ORAL
  Filled 2020-11-18 (×17): qty 1

## 2020-11-18 MED ORDER — GABAPENTIN 100 MG PO CAPS
100.0000 mg | ORAL_CAPSULE | Freq: Three times a day (TID) | ORAL | Status: DC
  Administered 2020-11-18 – 2020-11-23 (×17): 100 mg via ORAL
  Filled 2020-11-18 (×25): qty 1

## 2020-11-18 MED ORDER — INSULIN ASPART 100 UNIT/ML IJ SOLN
0.0000 [IU] | Freq: Every day | INTRAMUSCULAR | Status: DC
  Administered 2020-11-18: 2 [IU] via SUBCUTANEOUS
  Filled 2020-11-18: qty 0.05

## 2020-11-18 MED ORDER — HYDRALAZINE HCL 25 MG PO TABS
25.0000 mg | ORAL_TABLET | Freq: Three times a day (TID) | ORAL | Status: DC
  Administered 2020-11-18 – 2020-11-23 (×14): 25 mg via ORAL
  Filled 2020-11-18 (×25): qty 1

## 2020-11-18 MED ORDER — MAGNESIUM HYDROXIDE 400 MG/5ML PO SUSP
30.0000 mL | Freq: Every day | ORAL | Status: DC | PRN
  Administered 2020-11-18: 30 mL via ORAL
  Filled 2020-11-18: qty 30

## 2020-11-18 MED ORDER — INSULIN ASPART 100 UNIT/ML IJ SOLN
0.0000 [IU] | Freq: Three times a day (TID) | INTRAMUSCULAR | Status: DC
  Administered 2020-11-19: 3 [IU] via SUBCUTANEOUS
  Administered 2020-11-19: 5 [IU] via SUBCUTANEOUS
  Administered 2020-11-20 – 2020-11-21 (×3): 2 [IU] via SUBCUTANEOUS
  Administered 2020-11-21: 3 [IU] via SUBCUTANEOUS
  Filled 2020-11-18: qty 0.15

## 2020-11-18 MED ORDER — ACETAMINOPHEN 325 MG PO TABS
650.0000 mg | ORAL_TABLET | Freq: Four times a day (QID) | ORAL | Status: DC | PRN
Start: 2020-11-18 — End: 2020-11-23
  Administered 2020-11-23: 650 mg via ORAL
  Filled 2020-11-18: qty 2

## 2020-11-18 MED ORDER — SERTRALINE HCL 50 MG PO TABS
50.0000 mg | ORAL_TABLET | Freq: Every day | ORAL | Status: AC
  Administered 2020-11-18 – 2020-11-22 (×5): 50 mg via ORAL
  Filled 2020-11-18 (×6): qty 1

## 2020-11-18 MED ORDER — GLIPIZIDE 10 MG PO TABS
10.0000 mg | ORAL_TABLET | Freq: Two times a day (BID) | ORAL | Status: DC
  Filled 2020-11-18: qty 1
  Filled 2020-11-18: qty 2

## 2020-11-18 MED ORDER — BUSPIRONE HCL 10 MG PO TABS
5.0000 mg | ORAL_TABLET | Freq: Two times a day (BID) | ORAL | Status: DC
  Administered 2020-11-18 – 2020-11-23 (×11): 5 mg via ORAL
  Filled 2020-11-18 (×17): qty 1

## 2020-11-18 MED ORDER — TRAZODONE HCL 50 MG PO TABS
50.0000 mg | ORAL_TABLET | Freq: Every evening | ORAL | Status: DC | PRN
Start: 2020-11-18 — End: 2020-11-20
  Filled 2020-11-18: qty 1

## 2020-11-18 MED ORDER — QUETIAPINE FUMARATE 100 MG PO TABS
100.0000 mg | ORAL_TABLET | Freq: Every day | ORAL | Status: DC
  Administered 2020-11-19: 100 mg via ORAL
  Filled 2020-11-18 (×3): qty 1

## 2020-11-18 NOTE — H&P (Signed)
Psychiatric Admission Assessment Adult  Patient Identification: Lorraine Turner MRN:  [id number] Date of Evaluation:  11/18/2020 Chief Complaint:  MDD (major depressive disorder), recurrent episode, moderate ([place]) [F33.1] Principal Diagnosis: MDD (major depressive disorder), recurrent episode, moderate ([place]) Diagnosis:  Principal Problem:   MDD (major depressive disorder), recurrent episode, moderate ([place])  History of Present Illness: Records reviewed, report received and plan was discussed with members of our interdisciplinary team. Patient is an [place] female who was admitted early this morning from [facility] for suicide ideation and severe depression.  Patient has a hx of Depression, Suicide attempts and Schizoaffective disorder.  Patient reported that she has been feeling hopeless and helpless since her husband died.  Patient reported that that she lost her home to foreclosure and since then has been homeless, living here and there and some times in the shelter.  Patient was taken to [facility] ER from the street by Ambulance.  She is not suicidal today but depressed and helpless.  She rated her depression and anxiety 6/10 with 10 being severe depression.  She reported poor energy, hypersomnia and poor motivation.  Patient admitted to three previous inpatient hospitalization to [facility] 1985, 2012 AND 2015).  Patient also admitted to 6-7 suicide attempts and last was this past July when she walked into traffic hoping to be killed by a car.  Her plan this time was to step into traffic but was brought to the ER on time.  Patient sees a Mental health Provider at [facility].  Patient denied hx of substance or Alcohol use/Abuse.  She is a mother to 5 children and her son that was helping her is now incarcerated for a crime, her fours daughters are not in position to help her.  Patient reported her stressors are homelessness and financial  difficulties.  She appears unkempt and disheveled and tired this morning. We have resumed her home medications and will make changes as needed.  We will round on her daily and SW/CM will be involved in her care and look into her social needs.  Patient denied SI/HI/AVH this morning and no paranoia.  Patient slept 1.5 hors last night due to coming to the unit very late.  She has been sleeping this morning. Associated Signs/Symptoms: Depression Symptoms:  depressed mood, hypersomnia, feelings of worthlessness/guilt, hopelessness, suicidal thoughts with specific plan, anxiety, loss of energy/fatigue, Duration of Depression Symptoms: Greater than two weeks  (Hypo) Manic Symptoms:   na Anxiety Symptoms:  Excessive Worry, homelessness Psychotic Symptoms:   na PTSD Symptoms: na Total Time spent with patient:  50 minutes  Past Psychiatric History: Schizoaffective  D/O, Depression, Anxiety, suicide attempts  Is the patient at risk to self? Yes.    Has the patient been a risk to self in the past 6 months? Yes.    Has the patient been a risk to self within the distant past? Yes.    Is the patient a risk to others? No.  Has the patient been a risk to others in the past 6 months? No.  Has the patient been a risk to others within the distant past? No.   Prior Inpatient Therapy:   Prior Outpatient Therapy:    Alcohol Screening: 1. How often do you have a drink containing alcohol?: Monthly or less 2. How many drinks containing alcohol do you have on a typical day when you are drinking?: 1 or 2 3. How often do you  have six or more drinks on one occasion?: Never AUDIT-C Score: 1 4. How often during the last year have you found that you were not able to stop drinking once you had started?: Never 5. How often during the last year have you failed to do what was normally expected from you because of drinking?: Never 6. How often during the last year have you needed a first drink in the morning to get  yourself going after a heavy drinking session?: Never 7. How often during the last year have you had a feeling of guilt of remorse after drinking?: Less than monthly 8. How often during the last year have you been unable to remember what happened the night before because you had been drinking?: Never 9. Have you or someone else been injured as a result of your drinking?: No 10. Has a relative or friend or a doctor or another health worker been concerned about your drinking or suggested you cut down?: No Alcohol Use Disorder Identification Test Final Score (AUDIT): 2 Substance Abuse History in the last 12 months:  No. Consequences of Substance Abuse: NA Previous Psychotropic Medications: Yes  Psychological Evaluations: Yes  Past Medical History:  Past Medical History:  Diagnosis Date  . Anxiety   . Bipolar 1 disorder ([place])   . Depression   . Diabetes mellitus without complication ([place])   . Gallstones   . Hyperlipidemia   . Hypertension   . Neuropathy   . Schizophrenia ([place])   . Seizures ([place])    X1- febrile seizure as a child-none since    Past Surgical History:  Procedure Laterality Date  . CESAREAN SECTION  05/1998   X 1  . CHOLECYSTECTOMY N/A 03/27/2016   Procedure: LAPAROSCOPIC CHOLECYSTECTOMY;  Surgeon: Olean Ree, MD;  Location: [facility] ORS;  Service: General;  Laterality: N/A;  . TONSILLECTOMY AND ADENOIDECTOMY     age 89  . TUBAL LIGATION     Family History:  Family History  Problem Relation Age of Onset  . Diabetes Mother   . Heart disease Mother   . Kidney disease Mother   . Stroke Mother   . Hyperlipidemia Mother   . Diabetes Maternal Aunt    Family Psychiatric  History: Paternal Cousin committed suicide Tobacco Screening:   Social History:  Social History   Substance and Sexual Activity  Alcohol Use Yes  . Alcohol/week: 1.0 standard drink  . Types: 1 Cans of beer per week   Comment: One can of beer every 2-3 months.      Social History   Substance  and Sexual Activity  Drug Use No    Additional Social History:                           Allergies:   Allergies  Allergen Reactions  . Lisinopril Other (See Comments)    Patient does not wish to take this medication (she heard that it can make your kidneys worse)  . Amoxicillin Rash   Lab Results:  Results for orders placed or performed during the hospital encounter of 11/18/20 (from the past 48 hour(s))  Glucose, capillary     Status: Abnormal   Collection Time: 11/18/20  2:16 AM  Result Value Ref Range   Glucose-Capillary 156 (H) 70 - 99 mg/dL    Comment: Glucose reference range applies only to samples taken after fasting for at least 8 hours.  Glucose, capillary     Status: Abnormal  Collection Time: 11/18/20  6:13 AM  Result Value Ref Range   Glucose-Capillary 138 (H) 70 - 99 mg/dL    Comment: Glucose reference range applies only to samples taken after fasting for at least 8 hours.    Blood Alcohol level:  Lab Results  Component Value Date   ETH <10 11/16/2020   ETH <10 25/85/2778    Metabolic Disorder Labs:  Lab Results  Component Value Date   HGBA1C 8.2 (H) 09/01/2020   MPG 189 09/01/2020   MPG 360.84 09/12/2018   No results found for: PROLACTIN Lab Results  Component Value Date   CHOL 235 (H) 01/13/2019   TRIG 91 01/13/2019   HDL 61 01/13/2019   CHOLHDL 3.9 01/13/2019   VLDL 14 03/05/2018   LDLCALC 158 (H) 01/13/2019   LDLCALC 121 (H) 03/05/2018    Current Medications: Current Facility-Administered Medications  Medication Dose Route Frequency Provider Last Rate Last Admin  . acetaminophen (TYLENOL) tablet 650 mg  650 mg Oral Q6H PRN Bobbitt, Shalon E, NP      . alum & mag hydroxide-simeth (MAALOX/MYLANTA) 200-200-20 MG/5ML suspension 30 mL  30 mL Oral Q4H PRN Bobbitt, Shalon E, NP      . amLODipine (NORVASC) tablet 10 mg  10 mg Oral Daily Bobbitt, Shalon E, NP   10 mg at 11/18/20 0812  . busPIRone (BUSPAR) tablet 5 mg  5 mg Oral BID  Bobbitt, Shalon E, NP   5 mg at 11/18/20 0812  . carvedilol (COREG) tablet 3.125 mg  3.125 mg Oral BID WC Bobbitt, Shalon E, NP   3.125 mg at 11/18/20 0812  . gabapentin (NEURONTIN) capsule 100 mg  100 mg Oral TID Bobbitt, Shalon E, NP   100 mg at 11/18/20 2423  . hydrALAZINE (APRESOLINE) tablet 25 mg  25 mg Oral TID Bobbitt, Shalon E, NP   25 mg at 11/18/20 0812  . insulin glargine-yfgn (SEMGLEE) injection 15 Units  15 Units Subcutaneous QHS Bobbitt, Shalon E, NP      . magnesium hydroxide (MILK OF MAGNESIA) suspension 30 mL  30 mL Oral Daily PRN Bobbitt, Shalon E, NP      . QUEtiapine (SEROQUEL) tablet 100 mg  100 mg Oral QHS Bobbitt, Shalon E, NP      . sertraline (ZOLOFT) tablet 50 mg  50 mg Oral Daily Bobbitt, Shalon E, NP   50 mg at 11/18/20 0812  . traZODone (DESYREL) tablet 50 mg  50 mg Oral QHS PRN Bobbitt, Shalon E, NP       PTA Medications: Medications Prior to Admission  Medication Sig Dispense Refill Last Dose  . amLODipine (NORVASC) 10 MG tablet Take 1 tablet (10 mg total) by mouth daily. (Patient not taking: Reported on 11/17/2020) 30 tablet 1   . Blood Glucose Monitoring Suppl (TRUE METRIX METER) w/Device KIT 1 Device by Does not apply route 3 (three) times daily. For blood sugar checks 1 kit 0   . busPIRone (BUSPAR) 5 MG tablet Take 1 tablet (5 mg total) by mouth 2 (two) times daily. (Patient not taking: Reported on 11/17/2020) 60 tablet 1   . carvedilol (COREG) 3.125 MG tablet Take 1 tablet (3.125 mg total) by mouth 2 (two) times daily with a meal. (Patient not taking: Reported on 11/17/2020) 60 tablet 1   . gabapentin (NEURONTIN) 100 MG capsule Take 1 capsule (100 mg total) by mouth 3 (three) times daily. (Patient not taking: Reported on 11/17/2020) 90 capsule 2   . glipiZIDE (GLUCOTROL) 10 MG  tablet Take 1 tablet (10 mg total) by mouth 2 (two) times daily before a meal. (Patient not taking: Reported on 11/17/2020) 60 tablet 1   . glucose blood test strip Use as instructed 100 each  12   . hydrALAZINE (APRESOLINE) 25 MG tablet Take 1 tablet (25 mg total) by mouth 3 (three) times daily. (Patient not taking: No sig reported) 90 tablet 1   . insulin glargine (LANTUS) 100 UNIT/ML injection Inject 0.15 mLs (15 Units total) into the skin at bedtime. (Patient not taking: Reported on 11/17/2020) 10 mL 1   . Insulin Syringe-Needle U-100 (SAFETY INSULIN SYRINGES) 30G X 5/16" 0.5 ML MISC 1 each by Does not apply route at bedtime. 100 each 3   . QUEtiapine (SEROQUEL) 100 MG tablet Take 1 tablet (100 mg total) by mouth at bedtime. For mood control (Patient not taking: No sig reported) 30 tablet 1   . sertraline (ZOLOFT) 50 MG tablet Take 1 tablet (50 mg total) by mouth daily. (Patient not taking: Reported on 11/17/2020) 30 tablet 1   . TRUEplus Lancets 28G MISC 1 each by Does not apply route 3 (three) times daily. For blood sugar checks 100 each 12     Musculoskeletal: Strength & Muscle Tone: within normal limits Gait & Station: normal Patient leans: Front            Psychiatric Specialty Exam:  Presentation  General Appearance: Disheveled; Casual  Eye Contact:Fair  Speech:Clear and Coherent; Normal Rate  Speech Volume:Normal  Handedness:Right   Mood and Affect  Mood:Anxious; Depressed; Hopeless  Affect:Congruent; Depressed   Thought Process  Thought Processes:Coherent; Goal Directed  Duration of Psychotic Symptoms: No data recorded Past Diagnosis of Schizophrenia or Psychoactive disorder: Yes  Descriptions of Associations:Intact  Orientation:Full (Time, Place and Person)  Thought Content:Logical  Hallucinations:Hallucinations: None Ideas of Reference:None  Suicidal Thoughts:Suicidal Thoughts: No Homicidal Thoughts:Homicidal Thoughts: No  Sensorium  Memory:Immediate Good; Recent Good; Remote Good  Judgment:Fair  Insight:Good   Executive Functions  Concentration:Good  Attention Span:Good  [place] of  Knowledge:Good  Language:Good   Psychomotor Activity  Psychomotor Activity: Psychomotor Activity: Normal  Assets  Assets:Communication Skills; Desire for Improvement   Sleep  Sleep: Number of Hours of Sleep: 1.5   Physical Exam: Physical Exam Vitals and nursing note reviewed.  Constitutional:      Appearance: Normal appearance.  HENT:     Head: Normocephalic and atraumatic.     Nose: Nose normal.  Cardiovascular:     Rate and Rhythm: Normal rate and regular rhythm.  Pulmonary:     Effort: Pulmonary effort is normal.  Musculoskeletal:        General: Normal range of motion.     Cervical back: Normal range of motion.  Skin:    General: Skin is warm and dry.  Neurological:     General: No focal deficit present.     Mental Status: She is alert and oriented to person, place, and time.   Review of Systems  Constitutional: Negative.   HENT: Negative.    Eyes: Negative.   Respiratory: Negative.    Cardiovascular: Negative.   Gastrointestinal: Negative.   Genitourinary: Negative.   Musculoskeletal: Negative.   Skin: Negative.   Neurological: Negative.   Psychiatric/Behavioral:  Positive for depression and suicidal ideas.   Blood pressure (!) 141/61, pulse 64, temperature 98.2 F (36.8 C), temperature source Oral, resp. rate 16, height '5\' 2"'  (1.575 m), weight 61.5 kg, SpO2 100 %. Body mass index is 24.78  kg/m.  Treatment Plan Summary: Daily contact with patient to assess and evaluate symptoms and progress in treatment and Medication management Resume -Buspirone 5 mg po bid for anxiety  Gabapentin 100 mg tid for pain/mood  Sertraline 50 mg po daily for anxiety and depression  Quetiapine 100 mg po at bed time for mood/Mental health For HTN-Amlodipine 10 mg po daily   Coreg tablet 3.125 mg po bid  Hydralazine 25 mg po tid For DM-Insulin Glargine-yfgn 15 units Sub daily at bed time   Offer PRN Medications per protocol.  Encourage group attendance and  participation  Observation Level/Precautions:  15 minute checks  Laboratory:  CBC Chemistry Profile HbAIC HCGResults-abnormal.  HCG negative.  Ha1c elevated back in July-Pt is diabetic.  Creatinine elevated-hx Kidney disease.  UDS-Negative, TSH result pending.  Psychotherapy:  Encouraged to participate in group activities  Medications:  See [facility]  Consultations:  Diabetes Coordinator  Discharge Concerns: Social needs-Housing, Financial  Estimated LOS:5-7 days  Other:     Physician Treatment Plan for Primary Diagnosis: MDD (major depressive disorder), recurrent episode, moderate ([place]) Long Term Goal(s): Improvement in symptoms so as ready for discharge  Short Term Goals: Ability to identify changes in lifestyle to reduce recurrence of condition will improve, Ability to verbalize feelings will improve, Ability to disclose and discuss suicidal ideas, Ability to demonstrate self-control will improve, Ability to identify and develop effective coping behaviors will improve, Ability to maintain clinical measurements within normal limits will improve, Compliance with prescribed medications will improve, and Ability to identify triggers associated with substance abuse/mental health issues will improve  Physician Treatment Plan for Secondary Diagnosis: Principal Problem:   MDD (major depressive disorder), recurrent episode, moderate ([place])  Long Term Goal(s): Improvement in symptoms so as ready for discharge  Short Term Goals: Ability to identify changes in lifestyle to reduce recurrence of condition will improve, Ability to verbalize feelings will improve, Ability to disclose and discuss suicidal ideas, Ability to demonstrate self-control will improve, Ability to identify and develop effective coping behaviors will improve, Ability to maintain clinical measurements within normal limits will improve, Compliance with prescribed medications will improve, and Ability to identify triggers associated with  substance abuse/mental health issues will improve  I certify that inpatient services furnished can reasonably be expected to improve the patient's condition.    Delfin Gant, NP-PMHNP-BC 10/8/202211:36 AM

## 2020-11-18 NOTE — BHH Counselor (Signed)
Lorraine Turner, patient, provided written consent for CSW team to coordinate aftercare at this time. CSW to have further conversations with patient regarding care in the community.   Patient is seen by [facility] and sees [place] who is possibly a Education officer, museum, patient unsure.    Signed:  Durenda Hurt, MSW, LCSWA, LCASA 11/18/2020 12:00 PM

## 2020-11-18 NOTE — Progress Notes (Addendum)
Patient ID: Lorraine Turner, female   DOB: Aug 16, 1960, 60 y.o.   MRN: [id number]  Admission Note:  60 yr female who presents VC in no acute distress for the treatment of SI and Depression. Pt appears flat and depressed. Pt was calm and cooperative with admission process. Pt presents with passive SI and contracts for safety upon admission. Pt denies AVH / pain at this time . Pt stated her husband died in 05-22-16 , pt house went into foreclosure and she was evicted 1 month ago. Pt stated she has been off her psych medications 3 weeks. Pt does not quantify time , if any pt was off her other medications.    Per Assessment:   female with history of DM, depression, HTN, hyperlipidemia, schizophrenia, anxiety, brought in by EMS for suicidal ideation.  Patient states that she has been feeling suicidal for the past month ever since she was evicted from her home.  Patient is living in [place], states there is a parking garage next-door and she has contemplated climbing to the tablet parking garage and jumping.  Patient has not done this because she states she wants to go to heaven and she like to think that God has a plan for her.  Patient has 5 children and several grandchildren, her daughter and her 3 children were living with the patient at the time she was evicted which resulted in a CPS case (states patient's daughter and her children were not to be living with her).  Patient denies use of drugs, does report social drinking around the holidays.  States that she hears voices at times.  Reports 1 prior suicide attempt when she stepped out into traffic on [address], states that the voices were very loud that time.  Patient came in by EMS, called a crisis line to report that she was having chest pain or shortness of breath which she states are her panic attack symptoms. Patient reports she is currently homeless and has been staying "here and there" to include homeless shelters.  A: Skin was assessed(Heather RN) and  found to be clear of any abnormal marks . PT searched and no contraband found, POC and unit policies explained and understanding verbalized. Consents obtained. Food and fluids offered, and  accepted.   R:Pt had no additional questions or concerns.

## 2020-11-18 NOTE — Progress Notes (Signed)
Patient did not attend wrap up group. 

## 2020-11-18 NOTE — Group Note (Signed)
[place] LCSW Group Therapy Note   Group Date: 11/18/2020 Start Time: 1000 End Time: 1030   Type of Therapy/Topic:  Group Therapy:  Emotion Regulation  Participation Level:  Did Not Attend     Description of Group:    The purpose of this group is to assist patients in learning to regulate negative emotions and experience positive emotions. Patients will be guided to discuss ways in which they have been vulnerable to their negative emotions. These vulnerabilities will be juxtaposed with experiences of positive emotions or situations, and patients challenged to use positive emotions to combat negative ones. Special emphasis will be placed on coping with negative emotions in conflict situations, and patients will process healthy conflict resolution skills.  Therapeutic Goals: Patient will identify two positive emotions or experiences to reflect on in order to balance out negative emotions:  Patient will label two or more emotions that they find the most difficult to experience:  Patient will be able to demonstrate positive conflict resolution skills through discussion or role plays:     Therapeutic Modalities:   Cognitive Behavioral Therapy Feelings Identification Dialectical Behavioral Therapy   Mliss Fritz, LCSWA

## 2020-11-18 NOTE — BHH Group Notes (Addendum)
Adult Psychoeducational Group Note  Date:  11/18/2020 Time:  2:24 PM  Group Topic/Focus:  Wellness Toolbox:   The focus of this group is to discuss various aspects of wellness, balancing those aspects and exploring ways to increase the ability to experience wellness.  Patients will create a wellness toolbox for use upon discharge.  Participation Level:  Active  Participation Quality:  Appropriate  Affect:  Appropriate  Cognitive:  Appropriate  Insight: Appropriate  Engagement in Group:  Engaged  Modes of Intervention:  Discussion  Additional Comments:  Patient attended Support System group led by the nurse and participated.   Zahria Ding W Jaidin Ugarte 0000000, 2:24 PM

## 2020-11-18 NOTE — BHH Group Notes (Signed)
Patient did not attend morning orientation and goal setting group.

## 2020-11-18 NOTE — BHH Group Notes (Signed)
Patient did not attend the Forgiveness group.

## 2020-11-18 NOTE — Progress Notes (Signed)
   11/18/20 2224  Psych Admission Type (Psych Patients Only)  Admission Status Involuntary  Psychosocial Assessment  Patient Complaints Depression  Eye Contact Brief  Facial Expression Flat  Affect Appropriate to circumstance  Speech Logical/coherent  Interaction Assertive  Motor Activity Other (Comment) (WDL)  Appearance/Hygiene Disheveled  Behavior Characteristics Appropriate to situation  Mood Depressed  Thought Process  Coherency WDL  Content WDL  Delusions None reported or observed  Perception WDL  Hallucination None reported or observed  Judgment Poor  Confusion None  Danger to Self  Current suicidal ideation? Denies  Danger to Others  Danger to Others None reported or observed

## 2020-11-18 NOTE — BHH Suicide Risk Assessment (Signed)
[place] INPATIENT:  Family/Significant Other Suicide Prevention Education  Suicide Prevention Education:  Patient Refusal for Family/Significant Other Suicide Prevention Education: The patient Lorraine Turner has refused to provide written consent for family/significant other to be provided Family/Significant Other Suicide Prevention Education during admission and/or prior to discharge.  Physician notified.  Durenda Hurt 11/18/2020, 12:00 PM

## 2020-11-18 NOTE — Progress Notes (Signed)
Inpatient Diabetes Program Recommendations  AACE/ADA: New Consensus Statement on Inpatient Glycemic Control (2015)  Target Ranges:  Prepandial:   less than 140 mg/dL      Peak postprandial:   less than 180 mg/dL (1-2 hours)      Critically ill patients:  140 - 180 mg/dL   Lab Results  Component Value Date   GLUCAP 209 (H) 11/18/2020   HGBA1C 8.2 (H) 09/01/2020    Review of Glycemic Control  Diabetes history: DM2 Outpatient Diabetes medications: Lantus 15 units QHS, glipizide 10 units BID Current orders for Inpatient glycemic control: Semglee 15 units QHS  HgbA1C - 8.2% Needs correction insulin.  Inpatient Diabetes Program Recommendations:    Add Novolog 0-9 units TID with meals and 0-5 HS  Need updated HgbA1C to assess glycemic control prior to admission  Will need to f/u with PCP regarding diabetes managemenet  Thank you. Lorenda Peck, RD, LDN, CDE Inpatient Diabetes Coordinator [phone]

## 2020-11-18 NOTE — Tx Team (Signed)
Initial Treatment Plan 11/18/2020 2:43 AM Kaiden Felix Pacini [id number]    PATIENT STRESSORS: Financial difficulties   Health problems   Loss of Housing   Medication change or noncompliance     PATIENT STRENGTHS: Ability for insight  Capable of independent living  General fund of knowledge  Motivation for treatment/growth    PATIENT IDENTIFIED PROBLEMS: Risk for SI  Homeless  "Getting a place, getting a job or job training,  getting teeth fixed"                 DISCHARGE CRITERIA:  Ability to meet basic life and health needs Adequate post-discharge living arrangements Improved stabilization in mood, thinking, and/or behavior Verbal commitment to aftercare and medication compliance  PRELIMINARY DISCHARGE PLAN: Attend aftercare/continuing care group Outpatient therapy Placement in alternative living arrangements  PATIENT/FAMILY INVOLVEMENT: This treatment plan has been presented to and reviewed with the patient, SHAQUERA SANDSTEDT.  The patient and family have been given the opportunity to ask questions and make suggestions.  Providence Crosby, RN 11/18/2020, 2:43 AM
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
## 2020-11-18 NOTE — Progress Notes (Addendum)
   11/18/20 0800  Psych Admission Type (Psych Patients Only)  Admission Status Involuntary  Psychosocial Assessment  Patient Complaints Depression  Eye Contact Brief  Facial Expression Other (Comment) (appropriate)  Affect Appropriate to circumstance  Speech Unremarkable  Interaction Assertive  Motor Activity  (steady gait)  Appearance/Hygiene Unremarkable  Behavior Characteristics Cooperative  Mood Pleasant;Depressed  Aggressive Behavior  Effect No apparent injury  Thought Process  Coherency WDL  Content WDL  Delusions None reported or observed  Perception WDL  Hallucination None reported or observed  Judgment Poor  Confusion None  Danger to Self  Current suicidal ideation? Denies  Danger to Others  Danger to Others None reported or observed   D. Pt presents as friendly- is pleasant during interactions- has been calm and cooperative and observed attending group led by nurse this afternoon. Pt currently denies SI/HI and A/VH, but stated that she sometimes hears voices telling her to do things. A. Labs and vitals monitored. Pt given and educated on medications. Pt supported emotionally and encouraged to express concerns and ask questions.   R. Pt remains safe with 15 minute checks. Will continue POC.

## 2020-11-18 NOTE — B[facility] Suicide Risk Assessment (Signed)
[facility] Admission Suicide Risk Assessment   Nursing information obtained from:  Patient Demographic factors:  Divorced or widowed Current Mental Status:  Suicidal ideation indicated by patient Loss Factors:  Financial problems / change in socioeconomic status, Decline in physical health Historical Factors:  Prior suicide attempts Risk Reduction Factors:  NA  Total Time spent with patient: 20 minutes Principal Problem: Schizoaffective disorder, depressive type ([place]) Diagnosis:  Principal Problem:   Schizoaffective disorder, depressive type ([place]) Active Problems:   Obsessive compulsive disorder   Suicide ideation   MDD (major depressive disorder), recurrent episode, moderate ([place])  Subjective Data: Aakriti states she is here for "panic attack, suicide attempt, voices, simply just won't being me". She has been increasingly depressed for weeks, in the context of being homeless since eviction 2 months ago. She is not sleeping or eating or eating very well. She has been compulsively shoplifting things she does not want for thrills. She states "I think I have a sexual addiction" due to behaviors involving a single partner. She endorses auditory hallucinations telling her to "do things". No visual hallucinations. Acitve suicidal ideation without plan, able to contract for safety.   Continued Clinical Symptoms:  Alcohol Use Disorder Identification Test Final Score (AUDIT): 2 The "Alcohol Use Disorders Identification Test", Guidelines for Use in Primary Care, Second Edition.  World Pharmacologist [facility]). Score between 0-7:  no or low risk or alcohol related problems. Score between 8-15:  moderate risk of alcohol related problems. Score between 16-19:  high risk of alcohol related problems. Score 20 or above:  warrants further diagnostic evaluation for alcohol dependence and treatment.   CLINICAL FACTORS:   Severe Anxiety and/or Agitation Obsessive-Compulsive Disorder Schizophrenia:   Command  hallucinatons More than one psychiatric diagnosis Currently Psychotic Previous Psychiatric Diagnoses and Treatments   Musculoskeletal: Strength & Muscle Tone: within normal limits Gait & Station: normal Patient leans: N/A  Psychiatric Specialty Exam:  Presentation  General Appearance: Disheveled; Casual  Eye Contact:Fair  Speech:Clear and Coherent; Normal Rate  Speech Volume:Normal  Handedness:Right   Mood and Affect  Mood:Anxious; Depressed; Hopeless  Affect:Congruent; Depressed   Thought Process  Thought Processes:Coherent; Goal Directed  Descriptions of Associations:Intact  Orientation:Full (Time, Place and Person)  Thought Content:Logical  History of Schizophrenia/Schizoaffective disorder:Yes  Duration of Psychotic Symptoms:No data recorded Hallucinations:Hallucinations: None  Ideas of Reference:None  Suicidal Thoughts:Suicidal Thoughts: No  Homicidal Thoughts:Homicidal Thoughts: No   Sensorium  Memory:Immediate Good; Recent Good; Remote Good  Judgment:Fair  Insight:Good   Executive Functions  Concentration:Good  Attention Span:Good  Advance of Knowledge:Good  Language:Good   Psychomotor Activity  Psychomotor Activity:Psychomotor Activity: Normal   Assets  Assets:Communication Skills; Desire for Improvement   Sleep  Sleep:Number of Hours of Sleep: 1.5    Physical Exam: Physical Exam Vitals and nursing note reviewed.  Constitutional:      Appearance: Normal appearance.  HENT:     Head: Normocephalic.     Nose: Nose normal.  Eyes:     Extraocular Movements: Extraocular movements intact.  Pulmonary:     Effort: Pulmonary effort is normal.  Musculoskeletal:     Cervical back: Normal range of motion.  Neurological:     General: No focal deficit present.     Mental Status: She is alert and oriented to person, place, and time.  Psychiatric:        Attention and Perception: Attention normal. She perceives  auditory hallucinations.        Mood and Affect: Mood is anxious  and depressed.        Speech: Speech normal.        Behavior: Behavior is cooperative.        Thought Content: Thought content is paranoid and delusional. Thought content includes suicidal ideation.        Cognition and Memory: Cognition normal. She does not exhibit impaired recent memory or impaired remote memory.   Review of Systems  Constitutional:  Positive for chills. Negative for fever.  HENT: Negative.    Eyes: Negative.   Respiratory:  Negative for cough.   Cardiovascular:  Negative for chest pain.  Gastrointestinal:  Negative for constipation, diarrhea, nausea and vomiting.  Genitourinary:  Negative for dysuria.  Musculoskeletal:  Negative for myalgias.  Skin:  Negative for rash.  Neurological:  Negative for headaches.  Blood pressure (!) 103/52, pulse 67, temperature 98.2 F (36.8 C), temperature source Oral, resp. rate 16, height '5\' 2"'$  (1.575 m), weight 61.5 kg, SpO2 100 %. Body mass index is 24.78 kg/m.   COGNITIVE FEATURES THAT CONTRIBUTE TO RISK:  Loss of executive function    SUICIDE RISK:   Severe:  Frequent, intense, and enduring suicidal ideation, specific plan, no subjective intent, but some objective markers of intent (i.e., choice of lethal method), the method is accessible, some limited preparatory behavior, evidence of impaired self-control, severe dysphoria/symptomatology, multiple risk factors present, and few if any protective factors, particularly a lack of social support.  PLAN OF CARE: Safety and Monitoring --  Admission to inpatient psychiatric unit for safety, stabilization and treatment -- Daily contact with patient to assess and evaluate symptoms and progress in treatment -- Patient's case to be discussed in multi-disciplinary team meeting. -- Patient will be encouraged to participate in the therapeutic group milieu. -- Observation Level : q15 minute checks -- Vital signs:  q12  hours -- Precautions: suicide.  Plan  -Monitor Vitals. -Monitor for thoughts of harm to self or others -Monitor for psychosis, disorganization or changes to cognition -Monitor for withdrawal symptoms. -Monitor for medication side effects.  Labs/Studies: Lipids, A1c  Medications: Gabapentin '100mg'$  PO TID Zoloft 5mg PO daily PRN for anxiety and sleep Seroquel '100mg'$  PO QHS  Buspar '5mg'$  PO BID        I certify that inpatient services furnished can reasonably be expected to improve the patient's condition.   SMaida Sale MD 11/18/2020, 2:50 PM

## 2020-11-19 DIAGNOSIS — F331 Major depressive disorder, recurrent, moderate: Secondary | ICD-10-CM | POA: Diagnosis not present

## 2020-11-19 LAB — GLUCOSE, CAPILLARY
Glucose-Capillary: 93 mg/dL (ref 70–99)
Glucose-Capillary: 201 mg/dL — ABNORMAL HIGH (ref 70–99)
Glucose-Capillary: 162 mg/dL — ABNORMAL HIGH (ref 70–99)
Glucose-Capillary: 182 mg/dL — ABNORMAL HIGH (ref 70–99)

## 2020-11-19 LAB — HEMOGLOBIN A1C
Hgb A1c MFr Bld: 8.3 % — ABNORMAL HIGH (ref 4.8–5.6)
Mean Plasma Glucose: 191.51 mg/dL

## 2020-11-19 MED ORDER — AMLODIPINE BESYLATE 5 MG PO TABS
5.0000 mg | ORAL_TABLET | Freq: Every day | ORAL | Status: DC
  Administered 2020-11-21 – 2020-11-23 (×3): 5 mg via ORAL
  Filled 2020-11-19 (×7): qty 1

## 2020-11-19 MED ORDER — LOPERAMIDE HCL 2 MG PO CAPS
2.0000 mg | ORAL_CAPSULE | Freq: Once | ORAL | Status: AC
  Administered 2020-11-20: 2 mg via ORAL
  Filled 2020-11-19 (×2): qty 1

## 2020-11-19 NOTE — Progress Notes (Signed)
[facility] Group Notes:  (Nursing/MHT/Case Management/Adjunct)  Date:  11/19/2020  Time:  2000  Type of Therapy:   wrap up group  Participation Level:  Active  Participation Quality:  Appropriate, Attentive, Sharing, and Supportive  Affect:  Appropriate  Cognitive:  Alert  Insight:  Improving  Engagement in Group:  Engaged  Modes of Intervention:  Clarification, Exploration, and Support  Summary of Progress/Problems: Positive thinking and positive change were discussed.   Shellia Cleverly 11/19/2020, 9:20 PM

## 2020-11-19 NOTE — BHH Group Notes (Signed)
Pt attended Relaxation Group. Pt watched a movie and ate snack and went over packets that were given out this afternoon.

## 2020-11-19 NOTE — Progress Notes (Signed)
Inpatient Diabetes Program Recommendations  AACE/ADA: New Consensus Statement on Inpatient Glycemic Control (2015)  Target Ranges:  Prepandial:   less than 140 mg/dL      Peak postprandial:   less than 180 mg/dL (1-2 hours)      Critically ill patients:  140 - 180 mg/dL   Lab Results  Component Value Date   GLUCAP 201 (H) 11/19/2020   HGBA1C 8.3 (H) 11/19/2020    Review of Glycemic Control  Post-prandials elevated. May benefit from addition of meal coverage insulin.  Current orders for Inpatient glycemic control: Semglee 15 units QHS, Novolog 0-15 units TID with meals and 0-5 HS  Inpatient Diabetes Program Recommendations:    Consider adding Novolog 3-4 units TID with meals.  Will continue to follow.  Thank you. Lorenda Peck, RD, LDN, CDE Inpatient Diabetes Coordinator [phone]

## 2020-11-19 NOTE — Progress Notes (Signed)
   11/19/20 2233  Psych Admission Type (Psych Patients Only)  Admission Status Involuntary  Psychosocial Assessment  Patient Complaints None  Facial Expression Flat  Affect Appropriate to circumstance  Speech Logical/coherent  Interaction Assertive  Motor Activity Other (Comment) (WDL)  Appearance/Hygiene Unremarkable;In hospital gown  Behavior Characteristics Appropriate to situation  Mood Pleasant  Thought Process  Coherency WDL  Content WDL  Delusions None reported or observed  Perception WDL  Hallucination None reported or observed  Judgment Poor  Confusion None  Danger to Self  Current suicidal ideation? Denies  Danger to Others  Danger to Others None reported or observed

## 2020-11-19 NOTE — Progress Notes (Deleted)
   11/19/20 2233  Psych Admission Type (Psych Patients Only)  Admission Status Involuntary  Psychosocial Assessment  Patient Complaints None  Facial Expression Flat  Affect Appropriate to circumstance  Speech Logical/coherent  Interaction Assertive  Motor Activity Other (Comment) (WDL)  Appearance/Hygiene Unremarkable;In hospital gown  Behavior Characteristics Appropriate to situation  Mood Pleasant  Thought Process  Coherency WDL  Content WDL  Delusions None reported or observed  Perception WDL  Hallucination None reported or observed  Judgment Poor  Confusion None  Danger to Self  Current suicidal ideation? Denies  Danger to Others  Danger to Others None reported or observed

## 2020-11-19 NOTE — Progress Notes (Addendum)
[facility] MD Progress Note  11/19/2020 11:51 AM Lorraine Turner  MRN:  [id number] Subjective:  " I am resting well, slept well.  Living in the street is not easy"  Reason for admission:Patient is an AA female who was admitted early this morning from [facility] ER for suicide ideation and severe depression.  Patient has a hx of Depression, Suicide attempts and Schizoaffective disorder.  Patient reported that she has been feeling hopeless and helpless since her husband died.  Patient reported that that she lost her home to foreclosure and since then has been homeless, living here and there and some times in the shelter.   Objective note:  Record is reviewed, nursing report received and care reviewed with members of our interdisciplinary team.  Patient is seen walking the hall this morning alert and oriented x4.  Patient has had a shower and appears better groomed this morning.  She denied feeling suicidal and is not hearing voices.  Patient is worried of housing when discharged.  She is scared of going back to the street again stating that the society is a dangerous place to be especially a female.  She like working and making little income. Her BP is low this am so Nursing staff held her BP medications which she is taking three.  Amlodipine is decreased from 10 mg to 5 mg.  We will keep an eye on her BP and consider changing Seroquel if  BP continues to be a problem for her.  She reported good sleep and appetite.  SW/CM will assist her with possible housing or shelter at discharge.  She is participating in group activities.  She denied SI/HI/AVH and no paranoia. Principal Problem: Schizoaffective disorder, depressive type ([place]) Diagnosis: Principal Problem:   Schizoaffective disorder, depressive type ([place]) Active Problems:   Obsessive compulsive disorder   Suicide ideation   MDD (major depressive disorder), recurrent episode, moderate (HCC)  Total Time spent with patient: 30 minutes  Past Psychiatric  History: see H&P Note  Past Medical History:  Past Medical History:  Diagnosis Date   Anxiety    Bipolar 1 disorder ([place])    Depression    Diabetes mellitus without complication ([place])    Gallstones    Hyperlipidemia    Hypertension    Neuropathy    Schizophrenia ([place])    Seizures ([employer])    X1- febrile seizure as a child-none since    Past Surgical History:  Procedure Laterality Date   CESAREAN SECTION  05/1998   X 1   CHOLECYSTECTOMY N/A 03/27/2016   Procedure: LAPAROSCOPIC CHOLECYSTECTOMY;  Surgeon: Olean Ree, MD;  Location: [facility] ORS;  Service: General;  Laterality: N/A;   TONSILLECTOMY AND ADENOIDECTOMY     age 60   TUBAL LIGATION     Family History:  Family History  Problem Relation Age of Onset   Diabetes Mother    Heart disease Mother    Kidney disease Mother    Stroke Mother    Hyperlipidemia Mother    Diabetes Maternal Aunt    Family Psychiatric  History: see H&P Note Social History:  Social History   Substance and Sexual Activity  Alcohol Use Yes   Alcohol/week: 1.0 standard drink   Types: 1 Cans of beer per week   Comment: One can of beer every 2-3 months.      Social History   Substance and Sexual Activity  Drug Use No    Social History   Socioeconomic History   Marital status: Married  Spouse name: Not on file   Number of children: Not on file   Years of education: Not on file   Highest education level: Not on file  Occupational History   Not on file  Tobacco Use   Smoking status: Former    Types: Cigarettes    Quit date: 03/14/2013    Years since quitting: 7.6   Smokeless tobacco: Never   Tobacco comments:    1 cigarette1-2 times year, only when she gets stressed  Vaping Use   Vaping Use: Never used  Substance and Sexual Activity   Alcohol use: Yes    Alcohol/week: 1.0 standard drink    Types: 1 Cans of beer per week    Comment: One can of beer every 2-3 months.    Drug use: No   Sexual activity: Not Currently  Other Topics  Concern   Not on file  Social History Narrative   ** Merged History Encounter **       Social Determinants of Health   Financial Resource Strain: Not on file  Food Insecurity: Not on file  Transportation Needs: Not on file  Physical Activity: Not on file  Stress: Not on file  Social Connections: Not on file   Additional Social History:                         Sleep: Fair  Appetite:  Good  Current Medications: Current Facility-Administered Medications  Medication Dose Route Frequency Provider Last Rate Last Admin   acetaminophen (TYLENOL) tablet 650 mg  650 mg Oral Q6H PRN Bobbitt, Shalon E, NP       alum & mag hydroxide-simeth (MAALOX/MYLANTA) 200-200-20 MG/5ML suspension 30 mL  30 mL Oral Q4H PRN Bobbitt, Shalon E, NP       [START ON 11/20/2020] amLODipine (NORVASC) tablet 5 mg  5 mg Oral Daily Mortimer Bair C, NP       busPIRone (BUSPAR) tablet 5 mg  5 mg Oral BID Bobbitt, Shalon E, NP   5 mg at 11/19/20 0759   carvedilol (COREG) tablet 3.125 mg  3.125 mg Oral BID WC Bobbitt, Shalon E, NP   3.125 mg at 11/18/20 1837   gabapentin (NEURONTIN) capsule 100 mg  100 mg Oral TID Bobbitt, Shalon E, NP   100 mg at 11/19/20 0759   hydrALAZINE (APRESOLINE) tablet 25 mg  25 mg Oral TID Bobbitt, Shalon E, NP   25 mg at 11/18/20 1838   insulin aspart (novoLOG) injection 0-15 Units  0-15 Units Subcutaneous TID WC Pashayan, Redgie Grayer, MD       insulin aspart (novoLOG) injection 0-5 Units  0-5 Units Subcutaneous QHS Briant Cedar, MD   2 Units at 11/18/20 2219   insulin glargine-yfgn (SEMGLEE) injection 15 Units  15 Units Subcutaneous QHS Bobbitt, Shalon E, NP   15 Units at 11/18/20 2138   loperamide (IMODIUM) capsule 2 mg  2 mg Oral Once Ajibola, Ene A, NP       magnesium hydroxide (MILK OF MAGNESIA) suspension 30 mL  30 mL Oral Daily PRN Bobbitt, Shalon E, NP   30 mL at 11/18/20 1839   QUEtiapine (SEROQUEL) tablet 100 mg  100 mg Oral QHS Bobbitt, Shalon E, NP        sertraline (ZOLOFT) tablet 50 mg  50 mg Oral Daily Bobbitt, Shalon E, NP   50 mg at 11/19/20 0800   traZODone (DESYREL) tablet 50 mg  50 mg Oral QHS PRN Bobbitt,  Lennie Muckle, NP        Lab Results:  Results for orders placed or performed during the hospital encounter of 11/18/20 (from the past 48 hour(s))  Glucose, capillary     Status: Abnormal   Collection Time: 11/18/20  2:16 AM  Result Value Ref Range   Glucose-Capillary 156 (H) 70 - 99 mg/dL    Comment: Glucose reference range applies only to samples taken after fasting for at least 8 hours.  Glucose, capillary     Status: Abnormal   Collection Time: 11/18/20  6:13 AM  Result Value Ref Range   Glucose-Capillary 138 (H) 70 - 99 mg/dL    Comment: Glucose reference range applies only to samples taken after fasting for at least 8 hours.  Glucose, capillary     Status: Abnormal   Collection Time: 11/18/20 12:10 PM  Result Value Ref Range   Glucose-Capillary 209 (H) 70 - 99 mg/dL    Comment: Glucose reference range applies only to samples taken after fasting for at least 8 hours.   Comment 1 Notify RN    Comment 2 Document in Chart   Glucose, capillary     Status: Abnormal   Collection Time: 11/18/20  4:57 PM  Result Value Ref Range   Glucose-Capillary 213 (H) 70 - 99 mg/dL    Comment: Glucose reference range applies only to samples taken after fasting for at least 8 hours.  Lipid panel     Status: Abnormal   Collection Time: 11/18/20  6:25 PM  Result Value Ref Range   Cholesterol 197 0 - 200 mg/dL   Triglycerides 144 <150 mg/dL   HDL 49 >40 mg/dL   Total CHOL/HDL Ratio 4.0 RATIO   VLDL 29 0 - 40 mg/dL   LDL Cholesterol 119 (H) 0 - 99 mg/dL    Comment:        Total Cholesterol/HDL:CHD Risk Coronary Heart Disease Risk Table                     Men   Women  1/2 Average Risk   3.4   3.3  Average Risk       5.0   4.4  2 X Average Risk   9.6   7.1  3 X Average Risk  23.4   11.0        Use the calculated Patient Ratio above and  the CHD Risk Table to determine the patient's CHD Risk.        ATP III CLASSIFICATION (LDL):  <100     mg/dL   Optimal  100-129  mg/dL   Near or Above                    Optimal  130-159  mg/dL   Borderline  160-189  mg/dL   High  >190     mg/dL   Very High Performed at [facility] [address] [place], [place] [address]   TSH     Status: None   Collection Time: 11/18/20  6:25 PM  Result Value Ref Range   TSH 2.280 0.350 - 4.500 uIU/mL    Comment: Performed by a 3rd Generation assay with a functional sensitivity of <=0.01 uIU/mL. Performed at [facility], [place] [address] [place], [place] [address]   Glucose, capillary     Status: Abnormal   Collection Time: 11/18/20  9:34 PM  Result Value Ref Range   Glucose-Capillary 232 (H) 70 -  99 mg/dL    Comment: Glucose reference range applies only to samples taken after fasting for at least 8 hours.  Glucose, capillary     Status: None   Collection Time: 11/19/20  5:58 AM  Result Value Ref Range   Glucose-Capillary 93 70 - 99 mg/dL    Comment: Glucose reference range applies only to samples taken after fasting for at least 8 hours.    Blood Alcohol level:  Lab Results  Component Value Date   ETH <10 11/16/2020   ETH <10 [id number]    Metabolic Disorder Labs: Lab Results  Component Value Date   HGBA1C 8.2 (H) 09/01/2020   MPG 189 09/01/2020   MPG 360.84 09/12/2018   No results found for: PROLACTIN Lab Results  Component Value Date   CHOL 197 11/18/2020   TRIG 144 11/18/2020   HDL 49 11/18/2020   CHOLHDL 4.0 11/18/2020   VLDL 29 11/18/2020   LDLCALC 119 (H) 11/18/2020   LDLCALC 158 (H) 01/13/2019    Physical Findings: AIMS:  , ,  ,  ,    CIWA:    COWS:     Musculoskeletal: Strength & Muscle Tone: within normal limits Gait & Station: normal Patient leans: Front  Psychiatric Specialty Exam:  Presentation  General Appearance: Appropriate for Environment; Casual;  Neat  Eye Contact:Good  Speech:Normal Rate; Clear and Coherent  Speech Volume:Normal  Handedness:Right   Mood and Affect  Mood:Depressed  Affect:Depressed   Thought Process  Thought Processes:Coherent; Goal Directed  Descriptions of Associations:Intact  Orientation:Full (Time, Place and Person)  Thought Content:Logical  History of Schizophrenia/Schizoaffective disorder:Yes  Duration of Psychotic Symptoms:No data recorded Hallucinations:Hallucinations: None  Ideas of Reference:None  Suicidal Thoughts:Suicidal Thoughts: No  Homicidal Thoughts:Homicidal Thoughts: No   Sensorium  Memory:Immediate Good; Recent Good; Remote Good  Judgment:Good  Insight:Good   Executive Functions  Concentration:Good  Attention Span:Good  [place] of Knowledge:Good  Language:Good   Psychomotor Activity  Psychomotor Activity:Psychomotor Activity: Normal   Assets  Assets:Communication Skills; Desire for Improvement; Resilience   Sleep  Sleep:Sleep: Fair Number of Hours of Sleep: 4  Physical Exam: Physical Exam Vitals and nursing note reviewed.  Constitutional:      Appearance: Normal appearance.  HENT:     Head: Normocephalic and atraumatic.     Nose: Nose normal.  Cardiovascular:     Rate and Rhythm: Bradycardia present.  Musculoskeletal:        General: Normal range of motion.     Cervical back: Normal range of motion.  Skin:    General: Skin is warm and dry.  Neurological:     General: No focal deficit present.     Mental Status: She is alert and oriented to person, place, and time.   Review of Systems  Constitutional: Negative.   HENT: Negative.    Eyes: Negative.   Respiratory: Negative.    Cardiovascular: Negative.   Gastrointestinal: Negative.   Genitourinary: Negative.   Musculoskeletal: Negative.   Skin: Negative.   Neurological: Negative.   Endo/Heme/Allergies: Negative.   Psychiatric/Behavioral:  Positive for depression.  The patient is nervous/anxious.   Blood pressure (!) 112/52, pulse (!) 56, temperature 98.6 F (37 C), temperature source Oral, resp. rate 16, height '5\' 2"'$  (1.575 m), weight 61.5 kg, SpO2 99 %. Body mass index is 24.78 kg/m.  Treatment Plan Summary: Daily contact with patient to assess and evaluate symptoms and progress in treatment and Medication management Continue -Buspirone 5 mg po bid for anxiety  Gabapentin 100 mg tid for pain/mood             Sertraline 50 mg po daily for anxiety and depression             Quetiapine 100 mg po at bed time for mood/Mental health For HTN-Decrease Amlodipine to 5 mg po daily              Coreg tablet 3.125 mg po bid             Hydralazine 25 mg po tid For DM-Insulin Glargine-yfgn 15 units Sub daily at bed time            Offer PRN Medications per protocol. Encourage group attendance and participation Delfin Gant, NP-PMHNP-BC 11/19/2020, 11:51 AM

## 2020-11-19 NOTE — Progress Notes (Addendum)
   11/19/20 1300  Psych Admission Type (Psych Patients Only)  Admission Status Involuntary  Psychosocial Assessment  Patient Complaints Anxiety  Eye Contact Brief  Facial Expression Flat  Affect Appropriate to circumstance  Speech Logical/coherent  Interaction Assertive  Motor Activity Other (Comment) (WDL)  Appearance/Hygiene Disheveled  Behavior Characteristics Cooperative;Appropriate to situation  Mood Threatening;Anxious  Aggressive Behavior  Effect No apparent injury  Thought Process  Coherency WDL  Content WDL  Delusions None reported or observed  Perception WDL  Hallucination None reported or observed  Judgment Poor  Confusion None  Danger to Self  Current suicidal ideation? Denies  Danger to Others  Danger to Others None reported or observed   D. Pt has been friendly during interactions- observed in the milieu interacting well with peers and attending groups. Per pt's self inventory, pt rated her depression, hopelessness and anxiety all 7's. Pt reported that her goal today was "to find shelter, jobs, and have more faith in myself". Pt currently denies SI/HI and AVH A. Labs and vitals monitored. Pt given and educated on medications. Pt supported emotionally and encouraged to express concerns and ask questions.   R. Pt remains safe with 15 minute checks. Will continue POC.

## 2020-11-19 NOTE — Group Note (Signed)
LCSW Group Therapy Note  Group Date: 11/19/2020 Start Time: 1100 End Time: 1200   Type of Therapy and Topic:  Group Therapy: Anger Cues and Responses  Participation Level:  Minimal   Description of Group:   In this group, patients learned how to recognize the physical, cognitive, emotional, and behavioral responses they have to anger-provoking situations.  They identified a recent time they became angry and how they reacted.  They analyzed how their reaction was possibly beneficial and how it was possibly unhelpful.  The group discussed a variety of healthier coping skills that could help with such a situation in the future.  Focus was placed on how helpful it is to recognize the underlying emotions to our anger, because working on those can lead to a more permanent solution as well as our ability to focus on the important rather than the urgent.  Therapeutic Goals: Patients will remember their last incident of anger and how they felt emotionally and physically, what their thoughts were at the time, and how they behaved. Patients will identify how their behavior at that time worked for them, as well as how it worked against them. Patients will explore possible new behaviors to use in future anger situations. Patients will learn that anger itself is normal and cannot be eliminated, and that healthier reactions can assist with resolving conflict rather than worsening situations.  Summary of Patient Progress:  Group was supplemented with worksheets due to limited staffing.   Therapeutic Modalities:   Cognitive Behavioral Therapy    Vassie Moselle, LCSW 11/19/2020  11:43 AM

## 2020-11-19 NOTE — B[facility] Group Notes (Signed)
The focus of this group is to help patients establish daily goals to achieve during treatment and discuss how the patient can incorporate goal setting into their daily lives to aide in recovery. 

## 2020-11-20 ENCOUNTER — Encounter (HOSPITAL_COMMUNITY): Payer: Self-pay | Admitting: Adult Health

## 2020-11-20 ENCOUNTER — Ambulatory Visit: Payer: Medicaid Other | Admitting: Clinical

## 2020-11-20 ENCOUNTER — Encounter (HOSPITAL_COMMUNITY): Payer: Self-pay

## 2020-11-20 LAB — GLUCOSE, CAPILLARY
Glucose-Capillary: 147 mg/dL — ABNORMAL HIGH (ref 70–99)
Glucose-Capillary: 125 mg/dL — ABNORMAL HIGH (ref 70–99)
Glucose-Capillary: 89 mg/dL (ref 70–99)
Glucose-Capillary: 182 mg/dL — ABNORMAL HIGH (ref 70–99)

## 2020-11-20 MED ORDER — RISPERIDONE 1 MG PO TABS
1.0000 mg | ORAL_TABLET | Freq: Every day | ORAL | Status: DC
  Administered 2020-11-20 – 2020-11-22 (×3): 1 mg via ORAL
  Filled 2020-11-20 (×6): qty 1

## 2020-11-20 MED ORDER — LACTASE 3000 UNITS PO TABS
6000.0000 [IU] | ORAL_TABLET | Freq: Three times a day (TID) | ORAL | Status: DC
  Administered 2020-11-20 – 2020-11-23 (×9): 6000 [IU] via ORAL
  Filled 2020-11-20 (×16): qty 2

## 2020-11-20 MED ORDER — ADULT MULTIVITAMIN W/MINERALS CH
1.0000 | ORAL_TABLET | Freq: Every day | ORAL | Status: DC
  Administered 2020-11-20 – 2020-11-23 (×4): 1 via ORAL
  Filled 2020-11-20 (×7): qty 1

## 2020-11-20 MED ORDER — GLUCERNA SHAKE PO LIQD
237.0000 mL | Freq: Two times a day (BID) | ORAL | Status: DC
  Administered 2020-11-20 – 2020-11-23 (×6): 237 mL via ORAL
  Filled 2020-11-20 (×11): qty 237

## 2020-11-20 MED ORDER — MELATONIN 5 MG PO TABS
5.0000 mg | ORAL_TABLET | Freq: Every day | ORAL | Status: DC
  Administered 2020-11-20 – 2020-11-22 (×3): 5 mg via ORAL
  Filled 2020-11-20 (×5): qty 1

## 2020-11-20 NOTE — Progress Notes (Signed)
   11/20/20 2145  Psych Admission Type (Psych Patients Only)  Admission Status Involuntary  Psychosocial Assessment  Patient Complaints None  Eye Contact Fair  Facial Expression Animated  Affect Appropriate to circumstance  Speech Logical/coherent  Interaction Assertive  Motor Activity Other (Comment) (WDL)  Appearance/Hygiene Unremarkable  Behavior Characteristics Cooperative;Appropriate to situation  Mood Pleasant  Thought Process  Coherency WDL  Content WDL  Delusions None reported or observed  Perception WDL  Hallucination None reported or observed  Judgment Poor  Confusion None  Danger to Self  Current suicidal ideation? Denies  Danger to Others  Danger to Others None reported or observed

## 2020-11-20 NOTE — Group Note (Signed)
Occupational Therapy Group Note  Group Topic:Self-Esteem  Group Date: 11/20/2020 Start Time: 1400 End Time: 1450 Facilitators: Ponciano Ort, OT/L   Group Description: Group encouraged increased engagement and participation through discussion and activity focused on self-esteem. Patients explored and discussed the differences between healthy and low self-esteem and how it affects our daily lives and occupations with a focus on relationships, work, school, self-care, and personal leisure interests. Group discussion then transitioned into identifying specific strategies to boost self-esteem and engaged in a collaborative and independent activity looking at positive ways to describe oneself A-Z.   Therapeutic Goal(s): Understand and recognize the differences between healthy and low self-esteem Identify healthy strategies to improve/build self-esteem    Participation Level: Active   Participation Quality: Independent   Behavior: Calm, Cooperative, and Interactive    Speech/Thought Process: Focused   Affect/Mood: Euthymic   Insight: Fair   Judgement: Fair   Individualization: Marlinda was active in their participation of group discussion/activity. Pt joined about 20 minutes after the start however was quite active and receptive to activity and education around additional strategies to boost self-worth.   Modes of Intervention: Activity, Discussion, and Education  Patient Response to Interventions:  Attentive, Engaged, Interested , and Receptive   Plan: Continue to engage patient in OT groups 2 - 3x/week.  11/20/2020  Ponciano Ort, OT/L

## 2020-11-20 NOTE — Progress Notes (Addendum)
[facility] MD Progress Note  11/20/2020 4:33 PM Lorraine Turner  MRN:  [id number] Subjective:  " I am better, and my appetite is coming back."  Reason for admission: Total is a 60 year old female with a psychiatric history of schizoaffective disorder-depressive type, OCD, previous suicide attempts, and MDD-recurrent/severe who was admitted  from [facility] ER for suicide ideation and severe depression.   Patient reported that she has been feeling hopeless and helpless since her husband died.  Patient reported that that she lost her home to foreclosure and since then has been homeless, living here and there and some times in the shelter.    Objective note:  Record is reviewed, nursing report received and care reviewed with members of our interdisciplinary team.  Patient is seen walking the hall this morning alert and oriented x4.  She reports good sleep and an improved appetite, although she has concerns about snoring, questioning if she has sleep apnea.  She was advised that sleep apnea could only be diagnosed via a sleep study outside of the hospital.  Patient appears well groomed this afternoon, particularly after endorsing an episode of fecal incontinence s/p dairy consumption at lunchtime.  Patient's primary concern today is morning time orthostasis.  She says that in the mornings, she feels dizzy; she sits on the edge of her bed before getting up to move about.  When advised that this is a good practice, she was appreciative.  She denies somatic symptoms today.  She is hopeful about the future, building meaningful relationships, and eventually obtaining her own housing..  She is participating in group activities.  She denied SI/HI/AVH, paranoia, and first rank symptoms.  Principal Problem: Schizoaffective disorder, depressive type ([place]) Diagnosis: Principal Problem:   Schizoaffective disorder, depressive type ([place]) Active Problems:   Obsessive compulsive disorder   Primary hypertension   Type 2  diabetes mellitus with hyperglycemia ([place])  Total Time spent with patient: 30 minutes  Past Psychiatric History: see H&P Note  Past Medical History:  Past Medical History:  Diagnosis Date  . Anxiety   . Bipolar 1 disorder ([place])   . Depression   . Diabetes mellitus without complication ([place])   . Gallstones   . Hyperlipidemia   . Hypertension   . MDD (major depressive disorder), recurrent episode, moderate ([place]) 11/18/2020  . Neuropathy   . Schizophrenia ([place])   . Seizures ([place])    X1- febrile seizure as a child-none since  . Suicide ideation 09/12/2018    Past Surgical History:  Procedure Laterality Date  . CESAREAN SECTION  05/1998   X 1  . CHOLECYSTECTOMY N/A 03/27/2016   Procedure: LAPAROSCOPIC CHOLECYSTECTOMY;  Surgeon: Olean Ree, MD;  Location: [facility] ORS;  Service: General;  Laterality: N/A;  . TONSILLECTOMY AND ADENOIDECTOMY     age 6  . TUBAL LIGATION     Family History:  Family History  Problem Relation Age of Onset  . Diabetes Mother   . Heart disease Mother   . Kidney disease Mother   . Stroke Mother   . Hyperlipidemia Mother   . Diabetes Maternal Aunt    Family Psychiatric  History: see H&P Note Social History:  Social History   Substance and Sexual Activity  Alcohol Use Yes  . Alcohol/week: 1.0 standard drink  . Types: 1 Cans of beer per week   Comment: One can of beer every 2-3 months.      Social History   Substance and Sexual Activity  Drug Use No  Social History   Socioeconomic History  . Marital status: Married    Spouse name: Not on file  . Number of children: Not on file  . Years of education: Not on file  . Highest education level: Not on file  Occupational History  . Not on file  Tobacco Use  . Smoking status: Former    Types: Cigarettes    Quit date: 03/14/2013    Years since quitting: 7.6  . Smokeless tobacco: Never  . Tobacco comments:    1 cigarette1-2 times year, only when she gets stressed  Vaping Use  . Vaping Use:  Never used  Substance and Sexual Activity  . Alcohol use: Yes    Alcohol/week: 1.0 standard drink    Types: 1 Cans of beer per week    Comment: One can of beer every 2-3 months.   . Drug use: No  . Sexual activity: Not Currently  Other Topics Concern  . Not on file  Social History Narrative   ** Merged History Encounter **       Social Determinants of Health   Financial Resource Strain: Not on file  Food Insecurity: Not on file  Transportation Needs: Not on file  Physical Activity: Not on file  Stress: Not on file  Social Connections: Not on file   Additional Social History:       Sleep: Fair  Appetite:  Good  Current Medications: Current Facility-Administered Medications  Medication Dose Route Frequency Provider Last Rate Last Admin  . acetaminophen (TYLENOL) tablet 650 mg  650 mg Oral Q6H PRN Bobbitt, Shalon E, NP      . alum & mag hydroxide-simeth (MAALOX/MYLANTA) 200-200-20 MG/5ML suspension 30 mL  30 mL Oral Q4H PRN Bobbitt, Shalon E, NP      . amLODipine (NORVASC) tablet 5 mg  5 mg Oral Daily Onuoha, Josephine C, NP      . busPIRone (BUSPAR) tablet 5 mg  5 mg Oral BID Bobbitt, Shalon E, NP   5 mg at 11/20/20 0900  . carvedilol (COREG) tablet 3.125 mg  3.125 mg Oral BID WC Bobbitt, Shalon E, NP   3.125 mg at 11/19/20 1733  . feeding supplement (GLUCERNA SHAKE) (GLUCERNA SHAKE) liquid 237 mL  237 mL Oral BID BM Nelda Marseille, Amy E, MD   237 mL at 11/20/20 1006  . gabapentin (NEURONTIN) capsule 100 mg  100 mg Oral TID Bobbitt, Shalon E, NP   100 mg at 11/20/20 1214  . hydrALAZINE (APRESOLINE) tablet 25 mg  25 mg Oral TID Bobbitt, Shalon E, NP   25 mg at 11/20/20 1214  . insulin aspart (novoLOG) injection 0-15 Units  0-15 Units Subcutaneous TID WC Briant Cedar, MD   2 Units at 11/20/20 1215  . insulin aspart (novoLOG) injection 0-5 Units  0-5 Units Subcutaneous QHS Briant Cedar, MD   2 Units at 11/18/20 2219  . insulin glargine-yfgn (SEMGLEE) injection 15  Units  15 Units Subcutaneous QHS Bobbitt, Shalon E, NP   15 Units at 11/19/20 2043  . lactase (LACTAID) tablet 6,000 Units  6,000 Units Oral TID WC Cosby, Courtney, MD      . magnesium hydroxide (MILK OF MAGNESIA) suspension 30 mL  30 mL Oral Daily PRN Bobbitt, Shalon E, NP   30 mL at 11/18/20 1839  . melatonin tablet 5 mg  5 mg Oral QHS Jani Moronta, Jackie Plum, MD      . multivitamin with minerals tablet 1 tablet  1 tablet Oral Daily Nelda Marseille,  Amy E, MD   1 tablet at 11/20/20 1009  . risperiDONE (RISPERDAL) tablet 1 mg  1 mg Oral QHS Hiran Leard, Jackie Plum, MD      . sertraline (ZOLOFT) tablet 50 mg  50 mg Oral Daily Bobbitt, Shalon E, NP   50 mg at 11/20/20 0900    Lab Results:  Results for orders placed or performed during the hospital encounter of 11/18/20 (from the past 48 hour(s))  Glucose, capillary     Status: Abnormal   Collection Time: 11/18/20  4:57 PM  Result Value Ref Range   Glucose-Capillary 213 (H) 70 - 99 mg/dL    Comment: Glucose reference range applies only to samples taken after fasting for at least 8 hours.  Lipid panel     Status: Abnormal   Collection Time: 11/18/20  6:25 PM  Result Value Ref Range   Cholesterol 197 0 - 200 mg/dL   Triglycerides 144 <150 mg/dL   HDL 49 >40 mg/dL   Total CHOL/HDL Ratio 4.0 RATIO   VLDL 29 0 - 40 mg/dL   LDL Cholesterol 119 (H) 0 - 99 mg/dL    Comment:        Total Cholesterol/HDL:CHD Risk Coronary Heart Disease Risk Table                     Men   Women  1/2 Average Risk   3.4   3.3  Average Risk       5.0   4.4  2 X Average Risk   9.6   7.1  3 X Average Risk  23.4   11.0        Use the calculated Patient Ratio above and the CHD Risk Table to determine the patient's CHD Risk.        ATP III CLASSIFICATION (LDL):  <100     mg/dL   Optimal  100-129  mg/dL   Near or Above                    Optimal  130-159  mg/dL   Borderline  160-189  mg/dL   High  >190     mg/dL   Very High Performed at [facility] [address] [place], [place] [address]   TSH     Status: None   Collection Time: 11/18/20  6:25 PM  Result Value Ref Range   TSH 2.280 0.350 - 4.500 uIU/mL    Comment: Performed by a 3rd Generation assay with a functional sensitivity of <=0.01 uIU/mL. Performed at [facility], [place] [address] [place], [place] [address]   Glucose, capillary     Status: Abnormal   Collection Time: 11/18/20  9:34 PM  Result Value Ref Range   Glucose-Capillary 232 (H) 70 - 99 mg/dL    Comment: Glucose reference range applies only to samples taken after fasting for at least 8 hours.  Glucose, capillary     Status: None   Collection Time: 11/19/20  5:58 AM  Result Value Ref Range   Glucose-Capillary 93 70 - 99 mg/dL    Comment: Glucose reference range applies only to samples taken after fasting for at least 8 hours.  Hemoglobin A1c     Status: Abnormal   Collection Time: 11/19/20  7:24 AM  Result Value Ref Range   Hgb A1c MFr Bld 8.3 (H) 4.8 - 5.6 %    Comment: (NOTE) Pre diabetes:  5.7%-6.4%  Diabetes:              >6.4%  Glycemic control for   <7.0% adults with diabetes    Mean Plasma Glucose 191.51 mg/dL    Comment: Performed at [facility], [place] [address], [place], [place] [address]  Glucose, capillary     Status: Abnormal   Collection Time: 11/19/20 12:00 PM  Result Value Ref Range   Glucose-Capillary 201 (H) 70 - 99 mg/dL    Comment: Glucose reference range applies only to samples taken after fasting for at least 8 hours.  Glucose, capillary     Status: Abnormal   Collection Time: 11/19/20  5:10 PM  Result Value Ref Range   Glucose-Capillary 162 (H) 70 - 99 mg/dL    Comment: Glucose reference range applies only to samples taken after fasting for at least 8 hours.  Glucose, capillary     Status: Abnormal   Collection Time: 11/19/20  8:38 PM  Result Value Ref Range   Glucose-Capillary 182 (H) 70 - 99 mg/dL    Comment: Glucose reference range applies  only to samples taken after fasting for at least 8 hours.   Comment 1 Notify RN    Comment 2 Document in Chart   Glucose, capillary     Status: Abnormal   Collection Time: 11/20/20  5:49 AM  Result Value Ref Range   Glucose-Capillary 147 (H) 70 - 99 mg/dL    Comment: Glucose reference range applies only to samples taken after fasting for at least 8 hours.  Glucose, capillary     Status: Abnormal   Collection Time: 11/20/20 12:05 PM  Result Value Ref Range   Glucose-Capillary 125 (H) 70 - 99 mg/dL    Comment: Glucose reference range applies only to samples taken after fasting for at least 8 hours.    Blood Alcohol level:  Lab Results  Component Value Date   ETH <10 11/16/2020   ETH <10 [id number]    Metabolic Disorder Labs: Lab Results  Component Value Date   HGBA1C 8.3 (H) 11/19/2020   MPG 191.51 11/19/2020   MPG 189 09/01/2020   No results found for: PROLACTIN Lab Results  Component Value Date   CHOL 197 11/18/2020   TRIG 144 11/18/2020   HDL 49 11/18/2020   CHOLHDL 4.0 11/18/2020   VLDL 29 11/18/2020   LDLCALC 119 (H) 11/18/2020   LDLCALC 158 (H) 01/13/2019    Physical Findings:  Musculoskeletal: Strength & Muscle Tone: within normal limits Gait & Station: normal Patient leans: Front  Psychiatric Specialty Exam:  Presentation  General Appearance: Appropriate for Environment; Casual; Neat  Eye Contact:Good  Speech:Clear and Coherent; Normal Rate  Speech Volume:Normal  Handedness:Right   Mood and Affect  Mood:Euthymic  Affect:Congruent   Thought Process  Thought Processes:Coherent; Goal Directed  Descriptions of Associations:Intact  Orientation:Full (Time, Place and Person)  Thought Content:Logical  History of Schizophrenia/Schizoaffective disorder:Yes  Duration of Psychotic Symptoms:No data recorded Hallucinations:Hallucinations: None  Ideas of Reference:None  Suicidal Thoughts:Suicidal Thoughts: No  Homicidal Thoughts:Homicidal  Thoughts: No   Sensorium  Memory:Immediate Good; Recent Good; Remote Good  Judgment:Good  Insight:Good   Executive Functions  Concentration:Good  Attention Span:Good  [place] of Knowledge:Good  Language:Good   Psychomotor Activity  Psychomotor Activity:Psychomotor Activity: Normal   Assets  Assets:Communication Skills; Desire for Improvement; Resilience   Sleep  Sleep:Sleep: Good Number of Hours of Sleep: 6.25  Physical Exam: Physical Exam Vitals and nursing note reviewed.  Constitutional:  Appearance: Normal appearance.  HENT:     Head: Normocephalic and atraumatic.     Nose: Nose normal.  Cardiovascular:     Rate and Rhythm: Bradycardia present.  Musculoskeletal:        General: Normal range of motion.     Cervical back: Normal range of motion.  Skin:    General: Skin is warm and dry.  Neurological:     General: No focal deficit present.     Mental Status: She is alert and oriented to person, place, and time.   Review of Systems  Constitutional: Negative.   HENT: Negative.    Eyes: Negative.   Respiratory: Negative.    Cardiovascular: Negative.   Gastrointestinal: Negative.   Genitourinary: Negative.   Musculoskeletal: Negative.   Skin: Negative.   Neurological: Negative.   Endo/Heme/Allergies: Negative.   Psychiatric/Behavioral:  Positive for depression. The patient is nervous/anxious.   Blood pressure (!) 153/71, pulse 68, temperature 98.6 F (37 C), temperature source Oral, resp. rate 18, height '5\' 2"'$  (1.575 m), weight 61.5 kg, SpO2 100 %. Body mass index is 24.78 kg/m.  Treatment Plan Summary: Daily contact with patient to assess and evaluate symptoms and progress in treatment and Medication management Initiate- Risperdal 1 mg qHS for mood stabilization Continue -Buspirone 5 mg po bid for anxiety             Gabapentin 100 mg tid for pain/mood             Sertraline 50 mg po daily for anxiety and depression              Quetiapine 100 mg po discontinued due to low BP For HTN-Decrease Amlodipine to 5 mg po daily              Coreg tablet 3.125 mg po bid             Hydralazine 25 mg po tid For DM-Insulin Glargine-yfgn 15 units Sub daily at bed time For Lactose Intolerance- Lactaid 6000 units             Offer PRN Medications per protocol. Encourage group attendance and participation  Rosezetta Schlatter, MD 11/20/2020, 4:33 PM

## 2020-11-20 NOTE — Group Note (Signed)
Recreation Therapy Group Note   Group Topic:Stress Management  Group Date: 11/20/2020 Start Time: [id number] End Time: 0957 Facilitators: Victorino Sparrow, LRT/CTRS Location: [address] Dayroom  Goal Area(s) Addresses:  Patient will actively participate in stress management techniques presented during session.  Patient will successfully identify benefit of practicing stress management post d/c.   Group Description: Guided Imagery. LRT provided education, instruction, and demonstration on practice of visualization via guided imagery. Patient was asked to participate in the technique introduced during session. LRT debriefed including topics of mindfulness, stress management and specific scenarios each patient could use these techniques. Patients were given suggestions of ways to access scripts post d/c and encouraged to explore Youtube and other apps available on smartphones, tablets, and computers.   Affect/Mood: N/A   Participation Level: Did not attend    Clinical Observations/Individualized Feedback: Pt did not attend group.    Plan: Continue to engage patient in RT group sessions 2-3x/week.   Victorino Sparrow, LRT/CTRS 11/20/2020 12:39 PM

## 2020-11-20 NOTE — [facility] Group Notes (Signed)
[facility] Group Notes:  (Nursing/MHT/Case Management/Adjunct)  Date:  11/20/2020  Time:  4:00 pm  Type of Therapy:  Psychoeducational Skills  Participation Level:  Active  Participation Quality:  Appropriate  Affect:  Appropriate  Cognitive:  Appropriate  Insight:  Appropriate  Engagement in Group:  Engaged  Modes of Intervention:  Education  Summary of Progress/Problems: Educated patient on the dimensions of wellness. Patient was able to understand the components as evidence by  excepting that  awareness and acceptance of her feelings are part of having a sense of well-being.  Jerrye Beavers 11/20/2020, 5:31 PM

## 2020-11-20 NOTE — BH IP Treatment Plan (Signed)
Interdisciplinary Treatment and Diagnostic Plan Update  11/20/2020 Time of Session: 10:10am Lorraine Turner MRN: [id number]  Principal Diagnosis: Schizoaffective disorder, depressive type ([place])  Secondary Diagnoses: Principal Problem:   Schizoaffective disorder, depressive type ([place]) Active Problems:   Obsessive compulsive disorder   Suicide ideation   MDD (major depressive disorder), recurrent episode, moderate ([place])   Current Medications:  Current Facility-Administered Medications  Medication Dose Route Frequency Provider Last Rate Last Admin   acetaminophen (TYLENOL) tablet 650 mg  650 mg Oral Q6H PRN Bobbitt, Shalon E, NP       alum & mag hydroxide-simeth (MAALOX/MYLANTA) 200-200-20 MG/5ML suspension 30 mL  30 mL Oral Q4H PRN Bobbitt, Shalon E, NP       amLODipine (NORVASC) tablet 5 mg  5 mg Oral Daily Onuoha, Josephine C, NP       busPIRone (BUSPAR) tablet 5 mg  5 mg Oral BID Bobbitt, Shalon E, NP   5 mg at 11/20/20 0900   carvedilol (COREG) tablet 3.125 mg  3.125 mg Oral BID WC Bobbitt, Shalon E, NP   3.125 mg at 11/19/20 1733   feeding supplement (GLUCERNA SHAKE) (GLUCERNA SHAKE) liquid 237 mL  237 mL Oral BID BM Nelda Marseille, Amy E, MD   237 mL at 11/20/20 1006   gabapentin (NEURONTIN) capsule 100 mg  100 mg Oral TID Bobbitt, Shalon E, NP   100 mg at 11/20/20 0900   hydrALAZINE (APRESOLINE) tablet 25 mg  25 mg Oral TID Bobbitt, Shalon E, NP   25 mg at 11/19/20 2121   insulin aspart (novoLOG) injection 0-15 Units  0-15 Units Subcutaneous TID WC Briant Cedar, MD   2 Units at 11/20/20 0762   insulin aspart (novoLOG) injection 0-5 Units  0-5 Units Subcutaneous QHS Briant Cedar, MD   2 Units at 11/18/20 2219   insulin glargine-yfgn (SEMGLEE) injection 15 Units  15 Units Subcutaneous QHS Bobbitt, Shalon E, NP   15 Units at 11/19/20 2043   loperamide (IMODIUM) capsule 2 mg  2 mg Oral Once Ajibola, Ene A, NP       magnesium hydroxide (MILK OF MAGNESIA) suspension 30 mL   30 mL Oral Daily PRN Bobbitt, Shalon E, NP   30 mL at 11/18/20 1839   melatonin tablet 5 mg  5 mg Oral QHS Hill, Jackie Plum, MD       multivitamin with minerals tablet 1 tablet  1 tablet Oral Daily Nelda Marseille, Amy E, MD   1 tablet at 11/20/20 1009   risperiDONE (RISPERDAL) tablet 1 mg  1 mg Oral QHS Hill, Jackie Plum, MD       sertraline (ZOLOFT) tablet 50 mg  50 mg Oral Daily Bobbitt, Shalon E, NP   50 mg at 11/20/20 0900   PTA Medications: Medications Prior to Admission  Medication Sig Dispense Refill Last Dose   amLODipine (NORVASC) 10 MG tablet Take 1 tablet (10 mg total) by mouth daily. (Patient not taking: Reported on 11/17/2020) 30 tablet 1    Blood Glucose Monitoring Suppl (TRUE METRIX METER) w/Device KIT 1 Device by Does not apply route 3 (three) times daily. For blood sugar checks 1 kit 0    busPIRone (BUSPAR) 5 MG tablet Take 1 tablet (5 mg total) by mouth 2 (two) times daily. (Patient not taking: Reported on 11/17/2020) 60 tablet 1    carvedilol (COREG) 3.125 MG tablet Take 1 tablet (3.125 mg total) by mouth 2 (two) times daily with a meal. (Patient not taking: Reported on 11/17/2020) 60  tablet 1    gabapentin (NEURONTIN) 100 MG capsule Take 1 capsule (100 mg total) by mouth 3 (three) times daily. (Patient not taking: Reported on 11/17/2020) 90 capsule 2    glipiZIDE (GLUCOTROL) 10 MG tablet Take 1 tablet (10 mg total) by mouth 2 (two) times daily before a meal. (Patient not taking: Reported on 11/17/2020) 60 tablet 1    glucose blood test strip Use as instructed 100 each 12    hydrALAZINE (APRESOLINE) 25 MG tablet Take 1 tablet (25 mg total) by mouth 3 (three) times daily. (Patient not taking: No sig reported) 90 tablet 1    insulin glargine (LANTUS) 100 UNIT/ML injection Inject 0.15 mLs (15 Units total) into the skin at bedtime. (Patient not taking: Reported on 11/17/2020) 10 mL 1    Insulin Syringe-Needle U-100 (SAFETY INSULIN SYRINGES) 30G X 5/16" 0.5 ML MISC 1 each by Does not  apply route at bedtime. 100 each 3    QUEtiapine (SEROQUEL) 100 MG tablet Take 1 tablet (100 mg total) by mouth at bedtime. For mood control (Patient not taking: No sig reported) 30 tablet 1    sertraline (ZOLOFT) 50 MG tablet Take 1 tablet (50 mg total) by mouth daily. (Patient not taking: Reported on 11/17/2020) 30 tablet 1    TRUEplus Lancets 28G MISC 1 each by Does not apply route 3 (three) times daily. For blood sugar checks 100 each 12     Patient Stressors: Financial difficulties   Health problems   Loss of Housing   Medication change or noncompliance    Patient Strengths: Ability for insight  Capable of independent living  General fund of knowledge  Motivation for treatment/growth   Treatment Modalities: Medication Management, Group therapy, Case management,  1 to 1 session with clinician, Psychoeducation, Recreational therapy.   Physician Treatment Plan for Primary Diagnosis: Schizoaffective disorder, depressive type ([place]) Long Term Goal(s): Improvement in symptoms so as ready for discharge   Short Term Goals: Ability to identify changes in lifestyle to reduce recurrence of condition will improve Ability to verbalize feelings will improve Ability to disclose and discuss suicidal ideas Ability to demonstrate self-control will improve Ability to identify and develop effective coping behaviors will improve Ability to maintain clinical measurements within normal limits will improve Compliance with prescribed medications will improve Ability to identify triggers associated with substance abuse/mental health issues will improve  Medication Management: Evaluate patient's response, side effects, and tolerance of medication regimen.  Therapeutic Interventions: 1 to 1 sessions, Unit Group sessions and Medication administration.  Evaluation of Outcomes: Progressing  Physician Treatment Plan for Secondary Diagnosis: Principal Problem:   Schizoaffective disorder, depressive type  ([place]) Active Problems:   Obsessive compulsive disorder   Suicide ideation   MDD (major depressive disorder), recurrent episode, moderate ([place])  Long Term Goal(s): Improvement in symptoms so as ready for discharge   Short Term Goals: Ability to identify changes in lifestyle to reduce recurrence of condition will improve Ability to verbalize feelings will improve Ability to disclose and discuss suicidal ideas Ability to demonstrate self-control will improve Ability to identify and develop effective coping behaviors will improve Ability to maintain clinical measurements within normal limits will improve Compliance with prescribed medications will improve Ability to identify triggers associated with substance abuse/mental health issues will improve     Medication Management: Evaluate patient's response, side effects, and tolerance of medication regimen.  Therapeutic Interventions: 1 to 1 sessions, Unit Group sessions and Medication administration.  Evaluation of Outcomes: Progressing   RN  Treatment Plan for Primary Diagnosis: Schizoaffective disorder, depressive type ([place]) Long Term Goal(s): Knowledge of disease and therapeutic regimen to maintain health will improve  Short Term Goals: Ability to remain free from injury will improve, Ability to verbalize frustration and anger appropriately will improve, Ability to demonstrate self-control, Ability to identify and develop effective coping behaviors will improve, and Compliance with prescribed medications will improve  Medication Management: RN will administer medications as ordered by provider, will assess and evaluate patient's response and provide education to patient for prescribed medication. RN will report any adverse and/or side effects to prescribing provider.  Therapeutic Interventions: 1 on 1 counseling sessions, Psychoeducation, Medication administration, Evaluate responses to treatment, Monitor vital signs and CBGs as ordered,  Perform/monitor CIWA, COWS, AIMS and Fall Risk screenings as ordered, Perform wound care treatments as ordered.  Evaluation of Outcomes: Progressing   LCSW Treatment Plan for Primary Diagnosis: Schizoaffective disorder, depressive type ([place]) Long Term Goal(s): Safe transition to appropriate next level of care at discharge, Engage patient in therapeutic group addressing interpersonal concerns.  Short Term Goals: Engage patient in aftercare planning with referrals and resources, Increase social support, Increase ability to appropriately verbalize feelings, Increase emotional regulation, and Increase skills for wellness and recovery  Therapeutic Interventions: Assess for all discharge needs, 1 to 1 time with Social worker, Explore available resources and support systems, Assess for adequacy in community support network, Educate family and significant other(s) on suicide prevention, Complete Psychosocial Assessment, Interpersonal group therapy.  Evaluation of Outcomes: Progressing   Progress in Treatment: Attending groups: Yes. Participating in groups: Yes. Taking medication as prescribed: Yes. Toleration medication: Yes. Family/Significant other contact made: No, will contact:  declined consents Patient understands diagnosis: Yes. Discussing patient identified problems/goals with staff: Yes. Medical problems stabilized or resolved: Yes. Denies suicidal/homicidal ideation: Yes. Issues/concerns per patient self-inventory: No.   New problem(s) identified: No, Describe:  none  New Short Term/Long Term Goal(s): medication stabilization, elimination of SI thoughts, development of comprehensive mental wellness plan.    Patient Goals:  "Find shelter; stay on medicine; find a job"  Discharge Plan or Barriers: Pt is currently homeless and has little resources available to her.   Reason for Continuation of Hospitalization: Anxiety Depression Medication stabilization  Estimated Length of  Stay: 3-5 days   Scribe for Treatment Team: Vassie Moselle, LCSW 11/20/2020 11:43 AM

## 2020-11-20 NOTE — BHH Counselor (Signed)
CSW provided the Pt with a packet that contains information about shelters/housing options, free/reduced food listings, IRC information, suicide prevention information, and bus passes.

## 2020-11-20 NOTE — Progress Notes (Signed)
Patient was invited to group.Patient did not attended group.

## 2020-11-20 NOTE — Progress Notes (Signed)
Pt did not attend the evening AA group. 

## 2020-11-20 NOTE — Progress Notes (Addendum)
   11/20/20 1100  Psych Admission Type (Psych Patients Only)  Admission Status Involuntary  Psychosocial Assessment  Patient Complaints None  Eye Contact Fair  Facial Expression Animated  Affect Appropriate to circumstance  Speech Logical/coherent  Interaction Assertive  Motor Activity Other (Comment) (WDL)  Appearance/Hygiene In hospital gown  Behavior Characteristics Cooperative;Appropriate to situation  Mood Pleasant  Aggressive Behavior  Effect No apparent injury  Thought Process  Coherency WDL  Content WDL  Delusions None reported or observed  Perception WDL  Hallucination None reported or observed  Judgment Poor  Confusion None  Danger to Self  Current suicidal ideation? Denies  Danger to Others  Danger to Others None reported or observed  D. Pt presents friendly, pleasant during interactions- pt has been visible in the milieu interacting well with peers and staff, and observed attending groups. Pt currently denies SI/HI and AVH  A. Labs and vitals monitored. Pt given and educated on medications. Pt supported emotionally and encouraged to express concerns and ask questions.   R. Pt remains safe with 15 minute checks. Will continue POC.

## 2020-11-20 NOTE — Progress Notes (Signed)
NUTRITION ASSESSMENT RD working remotely.  Pt identified as at risk on the Malnutrition Screen Tool and consult for diet education related to DM.   INTERVENTION: - will order Glucerna Shake BID, each supplement provides 220 kcal and 10 grams of protein. - will order 1 tablet multivitamin with minerals/day. [facility] staff to continue to encourage PO intakes of meals, supplements, and snacks.   NUTRITION DIAGNOSIS: Unintentional weight loss related to sub-optimal intake as evidenced by pt report.   Goal: Pt to meet >/= 90% of their estimated nutrition needs.  Monitor:  PO intake  Assessment:  Patient was admitted for MDD and SI. She has history of depression, schizoaffective disorder, and 6-7 suicide attempts. Patient shared with [facility] staff at time of admission that she is currently homeless.  Weight on 10/8 was 135 lb and weight on 09/26/20 was 139 lb. This indicates 4 lb weight loss (3% body weight) in ~2 months.  Will place DM diet education handout in the Discharge Instructions/AVS.   61 y.o. female  Height: Ht Readings from Last 1 Encounters:  11/18/20 '5\' 2"'$  (1.575 m)    Weight: Wt Readings from Last 1 Encounters:  11/18/20 61.5 kg    Weight Hx: Wt Readings from Last 10 Encounters:  11/18/20 61.5 kg  09/26/20 63.1 kg  09/02/20 74 kg  01/24/20 68.4 kg  10/21/19 68 kg  07/29/19 62.6 kg  04/21/19 64.4 kg  01/13/19 61.2 kg  10/16/18 54.4 kg  09/12/18 77.1 kg    BMI:  Body mass index is 24.78 kg/m. Pt meets criteria for normal weight based on current BMI.  Estimated Nutritional Needs: Kcal: 25-30 kcal/kg Protein: > 1 gram protein/kg Fluid: 1 ml/kcal  Diet Order:  Diet Order             Diet Carb Modified Fluid consistency: Thin; Room service appropriate? Yes  Diet effective now                  Pt is also offered choice of unit snacks mid-morning and mid-afternoon.  Pt is eating as desired.   Lab results and medications reviewed.     Jarome Matin, MS, RD, LDN, CNSC Inpatient Clinical Dietitian RD pager # available in [place]  After hours/weekend pager # available in [facility]

## 2020-11-20 NOTE — Discharge Instructions (Signed)

## 2020-11-20 NOTE — Group Note (Signed)
LCSW Group Therapy Note  Group Date: 11/20/2020 Start Time: 1300 End Time: 1400   Type of Therapy and Topic:  Group Therapy - How To Cope with Nervousness about Discharge   Participation Level:  Active   Description of Group This process group involved identification of patients' feelings about discharge. Some of them are scheduled to be discharged soon, while others are new admissions, but each of them was asked to share thoughts and feelings surrounding discharge from the hospital. One common theme was that they are excited at the prospect of going home, while another was that many of them are apprehensive about sharing why they were hospitalized. Patients were given the opportunity to discuss these feelings with their peers in preparation for discharge.  Therapeutic Goals  Patient will identify their overall feelings about pending discharge. Patient will think about how they might proactively address issues that they believe will once again arise once they get home (i.e. with parents). Patients will participate in discussion about having hope for change.   Summary of Patient Progress:  Lorraine Turner stated that one of her goals after discharge is to find shelter. Pt demonstrated insight into the subject matter, and proved open to input from peers and feedback from [place]. Pt was respectful of peers and participated throughout the entire session.   Therapeutic Modalities Cognitive Behavioral Therapy   Darleen Crocker, Latanya Presser 11/20/2020  2:04 PM

## 2020-11-21 DIAGNOSIS — R45851 Suicidal ideations: Secondary | ICD-10-CM

## 2020-11-21 DIAGNOSIS — F251 Schizoaffective disorder, depressive type: Secondary | ICD-10-CM | POA: Diagnosis not present

## 2020-11-21 DIAGNOSIS — F429 Obsessive-compulsive disorder, unspecified: Secondary | ICD-10-CM | POA: Diagnosis not present

## 2020-11-21 DIAGNOSIS — R197 Diarrhea, unspecified: Secondary | ICD-10-CM

## 2020-11-21 DIAGNOSIS — I10 Essential (primary) hypertension: Secondary | ICD-10-CM

## 2020-11-21 DIAGNOSIS — Z794 Long term (current) use of insulin: Secondary | ICD-10-CM

## 2020-11-21 DIAGNOSIS — E1165 Type 2 diabetes mellitus with hyperglycemia: Secondary | ICD-10-CM

## 2020-11-21 HISTORY — DX: Diarrhea, unspecified: R19.7

## 2020-11-21 LAB — GLUCOSE, CAPILLARY
Glucose-Capillary: 56 mg/dL — ABNORMAL LOW (ref 70–99)
Glucose-Capillary: 57 mg/dL — ABNORMAL LOW (ref 70–99)
Glucose-Capillary: 81 mg/dL (ref 70–99)
Glucose-Capillary: 133 mg/dL — ABNORMAL HIGH (ref 70–99)
Glucose-Capillary: 155 mg/dL — ABNORMAL HIGH (ref 70–99)
Glucose-Capillary: 76 mg/dL (ref 70–99)

## 2020-11-21 MED ORDER — LOPERAMIDE HCL 2 MG PO CAPS
2.0000 mg | ORAL_CAPSULE | ORAL | Status: DC | PRN
  Administered 2020-11-22: 2 mg via ORAL
  Filled 2020-11-21: qty 1

## 2020-11-21 NOTE — BHH Group Notes (Signed)
[place] Group Notes:  (Nursing/MHT/Case Management/Adjunct)  Date:  11/21/2020  Time:  4:53 PM  Type of Therapy:  Psychoeducational Skills  Participation Level:  Active  Participation Quality:  Appropriate  Affect:  Appropriate  Cognitive:  Alert  Insight:  Improving  Engagement in Group:  Improving  Modes of Intervention:  Activity and Discussion  Summary of Progress/Problems:Pt attended group and participated in discussion.  Baleria Wyman R Nguyen Todorov 11/21/2020, 4:53 PM

## 2020-11-21 NOTE — Progress Notes (Signed)
D: Patient had a severe bout of diarrhea this morning. Her sheets had to be changed and she was incontinent of runny stool. She states she slept well; her appetite has improved; her energy level is high, and her concentration is good. She rates her depression and hopelessness as a 4. Her goal today is to work on "group and housing." Her main concern has been her diarrhea. She states she has experienced diarrhea for "over one week."   A: Continue to monitor medication management and MD orders.  Safety checks completed every 15 minutes per protocol.  Offer support and encouragement as needed.  R: Patient is receptive to staff; her behavior is appropriate.     11/21/20 1400  Psych Admission Type (Psych Patients Only)  Admission Status Involuntary  Psychosocial Assessment  Patient Complaints None  Eye Contact Fair  Facial Expression Animated  Affect Appropriate to circumstance  Speech Logical/coherent  Interaction Assertive  Motor Activity Slow  Appearance/Hygiene Unremarkable  Behavior Characteristics Cooperative  Mood Pleasant  Thought Process  Coherency WDL  Content WDL  Delusions None reported or observed  Perception WDL  Hallucination None reported or observed  Judgment Poor  Confusion None  Danger to Self  Current suicidal ideation? Denies  Danger to Others  Danger to Others None reported or observed

## 2020-11-21 NOTE — Group Note (Signed)
Recreation Therapy Group Note   Group Topic:Animal Assisted Therapy   Group Date: 11/21/2020 Start Time: 1430 End Time: 1515 Facilitators: Victorino Sparrow, LRT/CTRS Location: [place]  AAA/T Program Assumption of Risk Form signed by Patient/ or Parent Legal Guardian Yes  Patient is free of allergies or severe asthma Yes  Patient reports no fear of animals Yes  Patient reports no history of cruelty to animals Yes  Patient understands his/her participation is voluntary Yes  Patient washes hands before animal contact Yes  Patient washes hands after animal contact Yes   Affect/Mood: Appropriate   Participation Level: Engaged   Participation Quality: Independent   Behavior: Appropriate   Speech/Thought Process: Focused   Insight: Good   Judgement: Good   Modes of Intervention: Nurse, adult   Patient Response to Interventions:  Engaged   Education Outcome:  Acknowledges education and In group clarification offered    Clinical Observations/Individualized Feedback: Patient attended session and interacted appropriately with therapy dog and peers. Patient asked appropriate questions about therapy dog and his training. Patient shared stories about their pets at home with group.    Plan: Continue to engage patient in RT group sessions 2-3x/week.   Victorino Sparrow, LRT/CTRS 11/21/2020 3:58 PM

## 2020-11-21 NOTE — Progress Notes (Addendum)
Hypoglycemic Event  CBG: 56  Treatment: 8 oz juice/soda and peanut butter crackers.   Symptoms: Sweaty and confused  Follow-up CBG: Time:0625 CBG Result:57 CBG Time: [id number] CBG Result: 81  Possible Reasons for Event: Unknown  Comments/MD notified:Will continue to assess and monitor. Will notify provider.     [place]

## 2020-11-21 NOTE — BHH Group Notes (Signed)
Adult Psychoeducational Group Note  Date:  11/21/2020 Time:  8:40 PM  Group Topic/Focus:  Wrap-Up Group:   The focus of this group is to help patients review their daily goal of treatment and discuss progress on daily workbooks.  Participation Level:  Active  Participation Quality:  Appropriate and Attentive  Affect:  Appropriate  Cognitive:  Alert and Appropriate  Insight: Appropriate and Good  Engagement in Group:  Engaged  Modes of Intervention:  Discussion and Education  Additional Comments:  Pt attended and participated in wrap up group this evening and rated their day a 9/10. Pt stated that they went to groups and they are improving but they feel that they need more time here at [facility]. Pt goal is to speak again with the SW about their housing needs.   Cristi Loron 11/21/2020, 8:40 PM

## 2020-11-21 NOTE — Progress Notes (Signed)
   11/21/20 2131  Psych Admission Type (Psych Patients Only)  Admission Status Involuntary  Psychosocial Assessment  Patient Complaints None  Eye Contact Fair  Facial Expression Animated  Affect Appropriate to circumstance  Speech Logical/coherent;Soft  Interaction Assertive  Motor Activity Slow  Appearance/Hygiene Unremarkable  Behavior Characteristics Cooperative;Appropriate to situation  Mood Pleasant  Thought Process  Coherency WDL  Content WDL  Delusions None reported or observed  Perception WDL  Hallucination None reported or observed  Judgment Poor  Confusion None  Danger to Self  Current suicidal ideation? Denies  Danger to Others  Danger to Others None reported or observed

## 2020-11-21 NOTE — Progress Notes (Signed)
Inpatient Diabetes Program Recommendations  AACE/ADA: New Consensus Statement on Inpatient Glycemic Control (2015)  Target Ranges:  Prepandial:   less than 140 mg/dL      Peak postprandial:   less than 180 mg/dL (1-2 hours)      Critically ill patients:  140 - 180 mg/dL   Lab Results  Component Value Date   GLUCAP 81 11/21/2020   HGBA1C 8.3 (H) 11/19/2020    Review of Glycemic Control  Diabetes history: DM2 Outpatient Diabetes medications: Lantus 15 units QHS, glipizide 10 BID Current orders for Inpatient glycemic control: Semglee 15 units QHS, Novolog 0-15 units TID with meals and 0-5 HS  Inpatient Diabetes Program Recommendations:   Decrease Semglee to 13 units QHS  Continue to follow glucose trends.  Thank you. Lorenda Peck, RD, LDN, CDE Inpatient Diabetes Coordinator [phone]

## 2020-11-21 NOTE — Progress Notes (Signed)
[facility] MD Progress Note  11/21/2020 7:06 AM Jkayla TITIANNA NOLDE  MRN:  [id number] Subjective:  " My mood is, I guess okay."  Reason for admission: Mykalah is a 60 year old female with a psychiatric history of schizoaffective disorder-depressive type, OCD, previous suicide attempts, and MDD-recurrent/severe who was admitted  from [facility] for suicide ideation and severe depression.   Patient reported that she has been feeling hopeless and helpless since her husband died.  Patient reported that that she lost her home to foreclosure and since then has been homeless, living here and there and some times in the shelter.    Objective note:  Record is reviewed, nursing report received and care reviewed with members of our interdisciplinary team. Per RN, she endorses having diarrhea this AM. Her POC Glucose reading was 56 this AM, which resolved to 81 with 8 oz juice and peanut butter crackers. Patient was diaphoretic and confused during this hypoglycemic episode.  Patient is seen walking the hall this morning alert and oriented x4.  She reports poor sleep last night 2/2 GI upset, but she continues to endorse an improved appetite.  She says that her morning was "rough" due to her "blood sugar dropping and I became disoriented, diarrhea, and not much sleep last night."  Patient says that she has had 3-4 bowel movements since dinner last night, some of which have been watery in quality.  She denies further somatic symptoms today.  Patient's primary concern today is worry about housing and fear of the possibility that she may have a stomach bug.  She preferred not to discuss housing today, as she believes that it may upset her, but she is agreeable to discuss tomorrow.  She denied SI/HI/AVH, paranoia, and first rank symptoms.  Principal Problem: Schizoaffective disorder, depressive type ([place]) Diagnosis: Principal Problem:   Schizoaffective disorder, depressive type ([place]) Active Problems:   Obsessive compulsive  disorder   Primary hypertension   Type 2 diabetes mellitus with hyperglycemia ([place])  Total Time spent with patient: I personally spent 35 minutes on the unit in direct patient care. The direct patient care time included face-to-face time with the patient, reviewing the patient's chart, communicating with other professionals, and coordinating care. Greater than 50% of this time was spent in counseling or coordinating care with the patient regarding goals of hospitalization, psycho-education, and discharge planning needs.   Past Psychiatric History: see H&P Note  Past Medical History:  Past Medical History:  Diagnosis Date   Anxiety    Bipolar 1 disorder ([place])    Depression    Diabetes mellitus without complication ([place])    Gallstones    Hyperlipidemia    Hypertension    MDD (major depressive disorder), recurrent episode, moderate ([place]) 11/18/2020   Neuropathy    Schizophrenia ([place])    Seizures (HCC)    X1- febrile seizure as a child-none since   Suicide ideation 09/12/2018    Past Surgical History:  Procedure Laterality Date   CESAREAN SECTION  05/1998   X 1   CHOLECYSTECTOMY N/A 03/27/2016   Procedure: LAPAROSCOPIC CHOLECYSTECTOMY;  Surgeon: Olean Ree, MD;  Location: [facility] ORS;  Service: General;  Laterality: N/A;   TONSILLECTOMY AND ADENOIDECTOMY     age 3   TUBAL LIGATION     Family History:  Family History  Problem Relation Age of Onset   Diabetes Mother    Heart disease Mother    Kidney disease Mother    Stroke Mother    Hyperlipidemia Mother  Diabetes Maternal Aunt    Family Psychiatric  History: see H&P Note Social History:  Social History   Substance and Sexual Activity  Alcohol Use Yes   Alcohol/week: 1.0 standard drink   Types: 1 Cans of beer per week   Comment: One can of beer every 2-3 months.      Social History   Substance and Sexual Activity  Drug Use No    Social History   Socioeconomic History   Marital status: Married    Spouse name:  Not on file   Number of children: Not on file   Years of education: Not on file   Highest education level: Not on file  Occupational History   Not on file  Tobacco Use   Smoking status: Former    Types: Cigarettes    Quit date: 03/14/2013    Years since quitting: 7.6   Smokeless tobacco: Never   Tobacco comments:    1 cigarette1-2 times year, only when she gets stressed  Vaping Use   Vaping Use: Never used  Substance and Sexual Activity   Alcohol use: Yes    Alcohol/week: 1.0 standard drink    Types: 1 Cans of beer per week    Comment: One can of beer every 2-3 months.    Drug use: No   Sexual activity: Not Currently  Other Topics Concern   Not on file  Social History Narrative   ** Merged History Encounter **       Social Determinants of Health   Financial Resource Strain: Not on file  Food Insecurity: Not on file  Transportation Needs: Not on file  Physical Activity: Not on file  Stress: Not on file  Social Connections: Not on file   Additional Social History:       Sleep: Fair  Appetite:  Good  Current Medications: Current Facility-Administered Medications  Medication Dose Route Frequency Provider Last Rate Last Admin   acetaminophen (TYLENOL) tablet 650 mg  650 mg Oral Q6H PRN Bobbitt, Shalon E, NP       alum & mag hydroxide-simeth (MAALOX/MYLANTA) 200-200-20 MG/5ML suspension 30 mL  30 mL Oral Q4H PRN Bobbitt, Shalon E, NP       amLODipine (NORVASC) tablet 5 mg  5 mg Oral Daily Onuoha, Josephine C, NP       busPIRone (BUSPAR) tablet 5 mg  5 mg Oral BID Bobbitt, Shalon E, NP   5 mg at 11/20/20 1722   carvedilol (COREG) tablet 3.125 mg  3.125 mg Oral BID WC Bobbitt, Shalon E, NP   3.125 mg at 11/20/20 1722   feeding supplement (GLUCERNA SHAKE) (GLUCERNA SHAKE) liquid 237 mL  237 mL Oral BID BM Nelda Marseille, Amy E, MD   237 mL at 11/20/20 2145   gabapentin (NEURONTIN) capsule 100 mg  100 mg Oral TID Bobbitt, Shalon E, NP   100 mg at 11/20/20 1722   hydrALAZINE  (APRESOLINE) tablet 25 mg  25 mg Oral TID Bobbitt, Shalon E, NP   25 mg at 11/20/20 1722   insulin aspart (novoLOG) injection 0-15 Units  0-15 Units Subcutaneous TID WC Briant Cedar, MD   2 Units at 11/20/20 1215   insulin aspart (novoLOG) injection 0-5 Units  0-5 Units Subcutaneous QHS Briant Cedar, MD   2 Units at 11/18/20 2219   insulin glargine-yfgn (SEMGLEE) injection 15 Units  15 Units Subcutaneous QHS Bobbitt, Shalon E, NP   15 Units at 11/20/20 2146   lactase (LACTAID) tablet 6,000 Units  6,000 Units Oral TID WC Rosezetta Schlatter, MD   6,000 Units at 11/20/20 2145   magnesium hydroxide (MILK OF MAGNESIA) suspension 30 mL  30 mL Oral Daily PRN Bobbitt, Shalon E, NP   30 mL at 11/18/20 1839   melatonin tablet 5 mg  5 mg Oral QHS Hill, Jackie Plum, MD   5 mg at 11/20/20 2145   multivitamin with minerals tablet 1 tablet  1 tablet Oral Daily Harlow Asa, MD   1 tablet at 11/20/20 1009   risperiDONE (RISPERDAL) tablet 1 mg  1 mg Oral QHS Hill, Jackie Plum, MD   1 mg at 11/20/20 2145   sertraline (ZOLOFT) tablet 50 mg  50 mg Oral Daily Bobbitt, Shalon E, NP   50 mg at 11/20/20 0900    Lab Results:  Results for orders placed or performed during the hospital encounter of 11/18/20 (from the past 48 hour(s))  Hemoglobin A1c     Status: Abnormal   Collection Time: 11/19/20  7:24 AM  Result Value Ref Range   Hgb A1c MFr Bld 8.3 (H) 4.8 - 5.6 %    Comment: (NOTE) Pre diabetes:          5.7%-6.4%  Diabetes:              >6.4%  Glycemic control for   <7.0% adults with diabetes    Mean Plasma Glucose 191.51 mg/dL    Comment: Performed at [facility] [address] [place], [place] [address]  Glucose, capillary     Status: Abnormal   Collection Time: 11/19/20 12:00 PM  Result Value Ref Range   Glucose-Capillary 201 (H) 70 - 99 mg/dL    Comment: Glucose reference range applies only to samples taken after fasting for at least 8 hours.  Glucose, capillary      Status: Abnormal   Collection Time: 11/19/20  5:10 PM  Result Value Ref Range   Glucose-Capillary 162 (H) 70 - 99 mg/dL    Comment: Glucose reference range applies only to samples taken after fasting for at least 8 hours.  Glucose, capillary     Status: Abnormal   Collection Time: 11/19/20  8:38 PM  Result Value Ref Range   Glucose-Capillary 182 (H) 70 - 99 mg/dL    Comment: Glucose reference range applies only to samples taken after fasting for at least 8 hours.   Comment 1 Notify RN    Comment 2 Document in Chart   Glucose, capillary     Status: Abnormal   Collection Time: 11/20/20  5:49 AM  Result Value Ref Range   Glucose-Capillary 147 (H) 70 - 99 mg/dL    Comment: Glucose reference range applies only to samples taken after fasting for at least 8 hours.  Glucose, capillary     Status: Abnormal   Collection Time: 11/20/20 12:05 PM  Result Value Ref Range   Glucose-Capillary 125 (H) 70 - 99 mg/dL    Comment: Glucose reference range applies only to samples taken after fasting for at least 8 hours.  Glucose, capillary     Status: None   Collection Time: 11/20/20  5:22 PM  Result Value Ref Range   Glucose-Capillary 89 70 - 99 mg/dL    Comment: Glucose reference range applies only to samples taken after fasting for at least 8 hours.  Glucose, capillary     Status: Abnormal   Collection Time: 11/20/20  8:45 PM  Result Value Ref Range   Glucose-Capillary 182 (H) 70 -  99 mg/dL    Comment: Glucose reference range applies only to samples taken after fasting for at least 8 hours.  Glucose, capillary     Status: Abnormal   Collection Time: 11/21/20  6:10 AM  Result Value Ref Range   Glucose-Capillary 56 (L) 70 - 99 mg/dL    Comment: Glucose reference range applies only to samples taken after fasting for at least 8 hours.  Glucose, capillary     Status: Abnormal   Collection Time: 11/21/20  6:27 AM  Result Value Ref Range   Glucose-Capillary 57 (L) 70 - 99 mg/dL    Comment: Glucose  reference range applies only to samples taken after fasting for at least 8 hours.  Glucose, capillary     Status: None   Collection Time: 11/21/20  6:43 AM  Result Value Ref Range   Glucose-Capillary 81 70 - 99 mg/dL    Comment: Glucose reference range applies only to samples taken after fasting for at least 8 hours.    Blood Alcohol level:  Lab Results  Component Value Date   ETH <10 11/16/2020   ETH <10 [id number]    Metabolic Disorder Labs: Lab Results  Component Value Date   HGBA1C 8.3 (H) 11/19/2020   MPG 191.51 11/19/2020   MPG 189 09/01/2020   No results found for: PROLACTIN Lab Results  Component Value Date   CHOL 197 11/18/2020   TRIG 144 11/18/2020   HDL 49 11/18/2020   CHOLHDL 4.0 11/18/2020   VLDL 29 11/18/2020   LDLCALC 119 (H) 11/18/2020   LDLCALC 158 (H) 01/13/2019    Physical Findings:  Musculoskeletal: Strength & Muscle Tone: within normal limits Gait & Station: normal Patient leans: Front  Psychiatric Specialty Exam:  Presentation  General Appearance: Appropriate for Environment; Casual; Neat  Eye Contact:Good  Speech:Clear and Coherent; Normal Rate  Speech Volume:Normal  Handedness:Right   Mood and Affect  Mood:Euthymic  Affect:Congruent   Thought Process  Thought Processes:Coherent; Goal Directed  Descriptions of Associations:Intact  Orientation:Full (Time, Place and Person)  Thought Content:Logical  History of Schizophrenia/Schizoaffective disorder:Yes  Duration of Psychotic Symptoms:No data recorded Hallucinations:Hallucinations: None  Ideas of Reference:None  Suicidal Thoughts:Suicidal Thoughts: No  Homicidal Thoughts:Homicidal Thoughts: No   Sensorium  Memory:Immediate Good; Recent Good; Remote Good  Judgment:Good  Insight:Good   Executive Functions  Concentration:Good  Attention Span:Good  [place] of Knowledge:Good  Language:Good   Psychomotor Activity  Psychomotor  Activity:Psychomotor Activity: Normal   Assets  Assets:Communication Skills; Desire for Improvement; Resilience   Sleep  Sleep:Sleep: Good Number of Hours of Sleep: 6.25  Physical Exam: Physical Exam Vitals and nursing note reviewed.  Constitutional:      Appearance: Normal appearance.  HENT:     Head: Normocephalic and atraumatic.     Nose: Nose normal.  Musculoskeletal:        General: Normal range of motion.     Cervical back: Normal range of motion.  Skin:    General: Skin is warm and dry.  Neurological:     General: No focal deficit present.     Mental Status: She is alert and oriented to person, place, and time.   Review of Systems  Constitutional: Negative.   HENT: Negative.    Eyes: Negative.   Respiratory: Negative.    Cardiovascular: Negative.   Gastrointestinal: Negative.   Genitourinary: Negative.   Musculoskeletal: Negative.   Skin: Negative.   Neurological: Negative.   Endo/Heme/Allergies: Negative.   Blood pressure (!) 143/61, pulse 65,  temperature 98.7 F (37.1 C), temperature source Oral, resp. rate 18, height '5\' 2"'$  (1.575 m), weight 61.5 kg, SpO2 100 %. Body mass index is 24.78 kg/m.  Treatment Plan Summary: Daily contact with patient to assess and evaluate symptoms and progress in treatment and Medication management Initiate- Risperdal 1 mg qHS for mood stabilization Continue -Buspirone 5 mg po bid for anxiety             Gabapentin 100 mg tid for pain/mood             Sertraline 50 mg po daily for anxiety and depression             Quetiapine 100 mg po discontinued due to low BP For HTN-Decrease Amlodipine to 5 mg po daily              Coreg tablet 3.125 mg po bid             Hydralazine 25 mg po tid For DM-, Per Diabetes Coordinator, Insulin Glargine-yfgn decreased to 13 units Sub daily at bed time; Sliding scale Novolog 0-15 units TID w/ meals and 0-5 units qHS For Lactose Intolerance- Lactaid 6000 units     For diarrhea- Immodium PRN  added to MAR.         Offer PRN Medications per protocol. Encourage group attendance and participation  Rosezetta Schlatter, MD 11/21/2020, 7:06 AM

## 2020-11-21 NOTE — BHH Group Notes (Signed)
Adult Psychoeducational Group Note  Date:  11/21/2020 Time:  10:26 AM  Group Topic/Focus:  Goals Group:   The focus of this group is to help patients establish daily goals to achieve during treatment and discuss how the patient can incorporate goal setting into their daily lives to aide in recovery.  Participation Level:  Active  Participation Quality:  Appropriate and Attentive  Affect:  Appropriate  Cognitive:  Alert and Appropriate  Insight: Good  Engagement in Group:  Engaged  Modes of Intervention:  Discussion  Additional Comments:  Pt attended and participated in the morning goal group. Pt's goals were to attend group, and speak to the treatment team about housing along with medicine for upset stomach.   Lorraine Turner 11/21/2020, 10:26 AM

## 2020-11-21 NOTE — BHH Group Notes (Signed)
Spiritual care group on grief and loss facilitated by chaplain Janne Napoleon, [facility]   Group Goal:   Support / Education around grief and loss   Members engage in facilitated group support and psycho-social education.   Group Description:   Following introductions and group rules, group members engaged in facilitated group dialog and support around topic of loss, with particular support around experiences of loss in their lives. Group Identified types of loss (relationships / self / things) and identified patterns, circumstances, and changes that precipitate losses. Reflected on thoughts / feelings around loss, normalized grief responses, and recognized variety in grief experience. Group noted Worden's four tasks of grief in discussion.   Group drew on Adlerian / Rogerian, narrative, MI,   Patient Progress: [place] attended group and engaged in the conversation.  Her comments were insightful and on topic.  She shared about the loss of her husband to cancer in 2018 and that her boyfriend has cancer diagnosis as well.  She was tearful at times.  [place], [place] Pager, [phone] 11:54 PM

## 2020-11-22 DIAGNOSIS — I10 Essential (primary) hypertension: Secondary | ICD-10-CM | POA: Diagnosis not present

## 2020-11-22 DIAGNOSIS — R197 Diarrhea, unspecified: Secondary | ICD-10-CM | POA: Diagnosis not present

## 2020-11-22 DIAGNOSIS — F251 Schizoaffective disorder, depressive type: Secondary | ICD-10-CM | POA: Diagnosis not present

## 2020-11-22 DIAGNOSIS — F429 Obsessive-compulsive disorder, unspecified: Secondary | ICD-10-CM | POA: Diagnosis not present

## 2020-11-22 LAB — GLUCOSE, CAPILLARY
Glucose-Capillary: 95 mg/dL (ref 70–99)
Glucose-Capillary: 71 mg/dL (ref 70–99)
Glucose-Capillary: 106 mg/dL — ABNORMAL HIGH (ref 70–99)
Glucose-Capillary: 124 mg/dL — ABNORMAL HIGH (ref 70–99)
Glucose-Capillary: 85 mg/dL (ref 70–99)
Glucose-Capillary: 163 mg/dL — ABNORMAL HIGH (ref 70–99)

## 2020-11-22 MED ORDER — INSULIN GLARGINE-YFGN 100 UNIT/ML ~~LOC~~ SOLN
13.0000 [IU] | Freq: Every day | SUBCUTANEOUS | Status: AC
  Administered 2020-11-22: 13 [IU] via SUBCUTANEOUS
  Filled 2020-11-22: qty 0.13

## 2020-11-22 MED ORDER — HYDROXYZINE HCL 25 MG PO TABS
25.0000 mg | ORAL_TABLET | Freq: Three times a day (TID) | ORAL | Status: DC | PRN
  Administered 2020-11-22: 25 mg via ORAL
  Filled 2020-11-22 (×2): qty 1

## 2020-11-22 NOTE — BHH Group Notes (Signed)
Adult Psychoeducational Group Note  Date:  11/22/2020 Time:  11:10 AM  Group Topic/Focus:  Personal Choices and Values:   The focus of this group is to help patients assess and explore the importance of values in their lives, how their values affect their decisions, how they express their values and what opposes their expression.  Participation Level:  Did Not Attend  Loney Loh 11/22/2020, 11:10 AM

## 2020-11-22 NOTE — Plan of Care (Signed)
Nurse discussed anxiety and coping skills with patient. 

## 2020-11-22 NOTE — Group Note (Signed)
LCSW Group Therapy Note  Group Date: 11/22/2020 Start Time: 1300 End Time: 1400   Type of Therapy and Topic:  Group Therapy: Using "I" Statements  Participation Level:  Active  Description of Group:  Patients were asked to provide details of some interpersonal conflicts they have experienced. Patients were then educated about "I" statements, communication which focuses on feelings or views of the speaker rather than what the other person is doing. T group members were asked to reflect on past conflicts and to provide specific examples for utilizing "I" statements.  Therapeutic Goals:  Patients will verbalize understanding of ineffective communication and effective communication. Patients will be able to empathize with whom they are having conflict. Patients will practice effective communication in the form of "I" statements.    Summary of Patient Progress:  Romonda shared that she often uses the phrase "I will try" because she does not want to make promise she cant keep or miscommunication something.. The patient was present/active throughout the session and proved open to feedback from [place] and peers. Patient demonstrated insight into the subject matter, was respectful of peers, and was present throughout the entire session.  Therapeutic Modalities:   Cognitive Behavioral Therapy Solution-Focused Therapy    Darleen Crocker, LCSW 11/22/2020  2:10 PM

## 2020-11-22 NOTE — Group Note (Signed)
Recreation Therapy Group Note   Group Topic:Stress Management  Group Date: 11/22/2020 Start Time: 0930 End Time: 0950 Facilitators: Victorino Sparrow, LRT/CTRS Location: [address] Dayroom  Goal Area(s) Addresses:  Patient will actively participate in stress management techniques presented during session.  Patient will successfully identify benefit of practicing stress management post d/c.    Group Description:  LRT explained the stress management technique of meditation. LRT proceeded to play a meditation focused on self forgiveness.  Patient was asked to participate in the technique introduced during session.  Patients were given suggestions of ways to access scripts post d/c and encouraged to explore Youtube and other apps available on smartphones, tablets, and computers.   Affect/Mood: N/A   Participation Level: Did not attend    Clinical Observations/Individualized Feedback: Pt did not attend group.    Plan: Continue to engage patient in RT group sessions 2-3x/week.   Victorino Sparrow, LRT/CTRS 11/22/2020 12:39 PM

## 2020-11-22 NOTE — Progress Notes (Signed)
D:  Patient denied SI and HI, contracts for safety.  Denied A/V hallucinations.   A:  Medications administered per MD orders.  Emotional support and encouragement given patient. R:  Safety maintained with 15 minute checks.  

## 2020-11-22 NOTE — Progress Notes (Addendum)
[facility] MD Progress Note  11/22/2020 5:05 PM Lorraine Turner  MRN:  [id number] Subjective:   Reason for admission: Lorraine Turner is a 60 year old female with a psychiatric history of schizoaffective disorder-depressive type, OCD, previous suicide attempts, and MDD-recurrent/severe who was admitted  from [facility] ER for suicide ideation and severe depression.   Patient reported that she has been feeling hopeless and helpless since her husband died.  Patient reported that she lost her home to foreclosure and since then has been homeless, living here and there and some times in the shelter.    Objective note:  Record is reviewed, nursing report received and care reviewed with members of our interdisciplinary team. Per RN, her POC Glucose reading was 95 then 71 upon recheck this AM. Yesterday, patient had another episode of fecal incontinence, which was really distressing to her.   Patient was asleep on attempt to assess x2, snoring loudly.  Upon reassessment by attending, Dr. Leverne Humbles: Lorraine Turner is a 60 y.o. female with schizoaffective disorder, depressive type.  She is found laying in bed in years on assessment.  She states that she is continuing to have abdominal pain and diarrhea.  She verbalizes that she is able to eat small amounts but it "goes right through her".  On psychiatric assessment, she will neither endorse nor deny suicidal ideation but only states repetitively "I am not going back on the street."  Patient states that she has not lived in a shelter in the past.  She verbalizes that she is unaware of discharge plan at this time.  She is not able to contract for safety.  Please review discharge safety plan with social worker.     Principal Problem: Schizoaffective disorder, depressive type ([place]) Diagnosis: Principal Problem:   Schizoaffective disorder, depressive type ([place]) Active Problems:   Obsessive compulsive disorder   Primary hypertension   Type 2 diabetes mellitus with  hyperglycemia ([place])   Diarrhea  Total Time spent with patient: I personally spent 35 minutes on the unit in direct patient care. The direct patient care time included face-to-face time with the patient, reviewing the patient's chart, communicating with other professionals, and coordinating care. Greater than 50% of this time was spent in counseling or coordinating care with the patient regarding goals of hospitalization, psycho-education, and discharge planning needs.   Past Psychiatric History: see H&P Note  Past Medical History:  Past Medical History:  Diagnosis Date   Anxiety    Bipolar 1 disorder ([place])    Depression    Diabetes mellitus without complication ([place])    Gallstones    Hyperlipidemia    Hypertension    MDD (major depressive disorder), recurrent episode, moderate ([place]) 11/18/2020   Neuropathy    Schizophrenia ([place])    Seizures (H[facility])    X1- febrile seizure as a child-none since   Suicide ideation 09/12/2018    Past Surgical History:  Procedure Laterality Date   CESAREAN SECTION  05/1998   X 1   CHOLECYSTECTOMY N/A 03/27/2016   Procedure: LAPAROSCOPIC CHOLECYSTECTOMY;  Surgeon: Olean Ree, MD;  Location: [facility] ORS;  Service: General;  Laterality: N/A;   TONSILLECTOMY AND ADENOIDECTOMY     age 29   TUBAL LIGATION     Family History:  Family History  Problem Relation Age of Onset   Diabetes Mother    Heart disease Mother    Kidney disease Mother    Stroke Mother    Hyperlipidemia Mother    Diabetes Maternal Aunt  Family Psychiatric  History: see H&P Note Social History:  Social History   Substance and Sexual Activity  Alcohol Use Yes   Alcohol/week: 1.0 standard drink   Types: 1 Cans of beer per week   Comment: One can of beer every 2-3 months.      Social History   Substance and Sexual Activity  Drug Use No    Social History   Socioeconomic History   Marital status: Married    Spouse name: Not on file   Number of children: Not on file   Years  of education: Not on file   Highest education level: Not on file  Occupational History   Not on file  Tobacco Use   Smoking status: Former    Types: Cigarettes    Quit date: 03/14/2013    Years since quitting: 7.6   Smokeless tobacco: Never   Tobacco comments:    1 cigarette1-2 times year, only when she gets stressed  Vaping Use   Vaping Use: Never used  Substance and Sexual Activity   Alcohol use: Yes    Alcohol/week: 1.0 standard drink    Types: 1 Cans of beer per week    Comment: One can of beer every 2-3 months.    Drug use: No   Sexual activity: Not Currently  Other Topics Concern   Not on file  Social History Narrative   ** Merged History Encounter **       Social Determinants of Health   Financial Resource Strain: Not on file  Food Insecurity: Not on file  Transportation Needs: Not on file  Physical Activity: Not on file  Stress: Not on file  Social Connections: Not on file   Additional Social History:       Sleep: Fair  Appetite:  Good  Current Medications: Current Facility-Administered Medications  Medication Dose Route Frequency Provider Last Rate Last Admin   acetaminophen (TYLENOL) tablet 650 mg  650 mg Oral Q6H PRN Bobbitt, Shalon E, NP       alum & mag hydroxide-simeth (MAALOX/MYLANTA) 200-200-20 MG/5ML suspension 30 mL  30 mL Oral Q4H PRN Bobbitt, Shalon E, NP       amLODipine (NORVASC) tablet 5 mg  5 mg Oral Daily Onuoha, Josephine C, NP   5 mg at 11/22/20 0748   busPIRone (BUSPAR) tablet 5 mg  5 mg Oral BID Bobbitt, Shalon E, NP   5 mg at 11/22/20 0735   carvedilol (COREG) tablet 3.125 mg  3.125 mg Oral BID WC Bobbitt, Shalon E, NP   3.125 mg at 11/22/20 0748   feeding supplement (GLUCERNA SHAKE) (GLUCERNA SHAKE) liquid 237 mL  237 mL Oral BID BM Singleton, Amy E, MD   237 mL at 11/22/20 1100   gabapentin (NEURONTIN) capsule 100 mg  100 mg Oral TID Bobbitt, Shalon E, NP   100 mg at 11/22/20 1214   hydrALAZINE (APRESOLINE) tablet 25 mg  25 mg Oral  TID Bobbitt, Shalon E, NP   25 mg at 11/22/20 1214   hydrOXYzine (ATARAX/VISTARIL) tablet 25 mg  25 mg Oral TID PRN Lavella Hammock, MD   25 mg at 11/22/20 1549   insulin aspart (novoLOG) injection 0-15 Units  0-15 Units Subcutaneous TID WC Briant Cedar, MD   3 Units at 11/21/20 1709   insulin aspart (novoLOG) injection 0-5 Units  0-5 Units Subcutaneous QHS Briant Cedar, MD   2 Units at 11/18/20 2219   insulin glargine-yfgn (SEMGLEE) injection 13 Units  13  Units Subcutaneous QHS Rosezetta Schlatter, MD       lactase (LACTAID) tablet 6,000 Units  6,000 Units Oral TID WC Rosezetta Schlatter, MD   6,000 Units at 11/22/20 1215   loperamide (IMODIUM) capsule 2 mg  2 mg Oral PRN Rosezetta Schlatter, MD   2 mg at 11/22/20 1315   magnesium hydroxide (MILK OF MAGNESIA) suspension 30 mL  30 mL Oral Daily PRN Bobbitt, Shalon E, NP   30 mL at 11/18/20 1839   melatonin tablet 5 mg  5 mg Oral QHS Hill, Jackie Plum, MD   5 mg at 11/21/20 2131   multivitamin with minerals tablet 1 tablet  1 tablet Oral Daily Harlow Asa, MD   1 tablet at 11/22/20 0736   risperiDONE (RISPERDAL) tablet 1 mg  1 mg Oral QHS Hill, Jackie Plum, MD   1 mg at 11/21/20 2131    Lab Results:  Results for orders placed or performed during the hospital encounter of 11/18/20 (from the past 48 hour(s))  Glucose, capillary     Status: None   Collection Time: 11/20/20  5:22 PM  Result Value Ref Range   Glucose-Capillary 89 70 - 99 mg/dL    Comment: Glucose reference range applies only to samples taken after fasting for at least 8 hours.  Glucose, capillary     Status: Abnormal   Collection Time: 11/20/20  8:45 PM  Result Value Ref Range   Glucose-Capillary 182 (H) 70 - 99 mg/dL    Comment: Glucose reference range applies only to samples taken after fasting for at least 8 hours.  Glucose, capillary     Status: Abnormal   Collection Time: 11/21/20  6:10 AM  Result Value Ref Range   Glucose-Capillary 56 (L) 70 - 99 mg/dL     Comment: Glucose reference range applies only to samples taken after fasting for at least 8 hours.  Glucose, capillary     Status: Abnormal   Collection Time: 11/21/20  6:27 AM  Result Value Ref Range   Glucose-Capillary 57 (L) 70 - 99 mg/dL    Comment: Glucose reference range applies only to samples taken after fasting for at least 8 hours.  Glucose, capillary     Status: None   Collection Time: 11/21/20  6:43 AM  Result Value Ref Range   Glucose-Capillary 81 70 - 99 mg/dL    Comment: Glucose reference range applies only to samples taken after fasting for at least 8 hours.  Glucose, capillary     Status: Abnormal   Collection Time: 11/21/20 11:56 AM  Result Value Ref Range   Glucose-Capillary 133 (H) 70 - 99 mg/dL    Comment: Glucose reference range applies only to samples taken after fasting for at least 8 hours.   Comment 1 Notify RN    Comment 2 Document in Chart   Glucose, capillary     Status: Abnormal   Collection Time: 11/21/20  5:00 PM  Result Value Ref Range   Glucose-Capillary 155 (H) 70 - 99 mg/dL    Comment: Glucose reference range applies only to samples taken after fasting for at least 8 hours.   Comment 1 Notify RN    Comment 2 Document in Chart   Glucose, capillary     Status: None   Collection Time: 11/21/20  8:45 PM  Result Value Ref Range   Glucose-Capillary 76 70 - 99 mg/dL    Comment: Glucose reference range applies only to samples taken after fasting for at least 8  hours.  Glucose, capillary     Status: None   Collection Time: 11/22/20  6:04 AM  Result Value Ref Range   Glucose-Capillary 95 70 - 99 mg/dL    Comment: Glucose reference range applies only to samples taken after fasting for at least 8 hours.  Glucose, capillary     Status: None   Collection Time: 11/22/20  7:01 AM  Result Value Ref Range   Glucose-Capillary 71 70 - 99 mg/dL    Comment: Glucose reference range applies only to samples taken after fasting for at least 8 hours.  Glucose,  capillary     Status: Abnormal   Collection Time: 11/22/20 12:05 PM  Result Value Ref Range   Glucose-Capillary 106 (H) 70 - 99 mg/dL    Comment: Glucose reference range applies only to samples taken after fasting for at least 8 hours.    Blood Alcohol level:  Lab Results  Component Value Date   ETH <10 11/16/2020   ETH <10 [id number]    Metabolic Disorder Labs: Lab Results  Component Value Date   HGBA1C 8.3 (H) 11/19/2020   MPG 191.51 11/19/2020   MPG 189 09/01/2020   No results found for: PROLACTIN Lab Results  Component Value Date   CHOL 197 11/18/2020   TRIG 144 11/18/2020   HDL 49 11/18/2020   CHOLHDL 4.0 11/18/2020   VLDL 29 11/18/2020   LDLCALC 119 (H) 11/18/2020   LDLCALC 158 (H) 01/13/2019    Physical Findings:  Musculoskeletal: Strength & Muscle Tone: within normal limits Gait & Station: normal Patient leans: Front  Psychiatric Specialty Exam:  Presentation  General Appearance: Appropriate for Environment; Casual  Eye Contact:Good (Patient initially sleeping, good eye contact on afternoon assessment)  Speech:Clear and Coherent; Normal Rate (Initially sleeping, normal and clear on reassessment)  Speech Volume:Normal (Initially sleeping)  Handedness:Right   Mood and Affect  Mood:Dysphoric (Initially sleeping; down about d/c on reassessment)  Affect:Congruent; Tearful (Initially sleeping)   Thought Process  Thought Processes:Coherent; Goal Directed (On reassessment)  Descriptions of Associations:Intact  Orientation:Full (Time, Place and Person)  Thought Content:Logical  History of Schizophrenia/Schizoaffective disorder:Yes  Duration of Psychotic Symptoms:No data recorded Hallucinations:Hallucinations: None  Ideas of Reference:None  Suicidal Thoughts:Suicidal Thoughts: No (Does not endorse or deny SI but unable to contract for safety.)  Homicidal Thoughts:Homicidal Thoughts: No   Sensorium  Memory:Immediate Good; Recent Good;  Remote Good  Judgment:Poor  Insight:Lacking   Executive Functions  Concentration:Good  Attention Span:Good  [place] of Knowledge:Good  Language:Good   Psychomotor Activity  Psychomotor Activity:Psychomotor Activity: Normal   Assets  Assets:Communication Skills; Desire for Improvement; Resilience   Sleep  Sleep:Sleep: Good Number of Hours of Sleep: 6.75   Physical Exam: Physical Exam Vitals and nursing note reviewed.  Constitutional:      Appearance: Normal appearance.  HENT:     Head: Normocephalic and atraumatic.     Nose: Nose normal.  Musculoskeletal:        General: Normal range of motion.     Cervical back: Normal range of motion.  Skin:    General: Skin is warm and dry.  Neurological:     General: No focal deficit present.     Mental Status: She is alert and oriented to person, place, and time.   Review of Systems  Constitutional: Negative.   HENT: Negative.    Eyes: Negative.   Respiratory: Negative.    Cardiovascular: Negative.   Gastrointestinal: Negative.   Genitourinary: Negative.   Musculoskeletal: Negative.  Skin: Negative.   Neurological: Negative.   Endo/Heme/Allergies: Negative.   Blood pressure (!) 120/51, pulse 65, temperature 98.9 F (37.2 C), temperature source Oral, resp. rate 16, height '5\' 2"'$  (1.575 m), weight 61.5 kg, SpO2 99 %. Body mass index is 24.78 kg/m.  Treatment Plan Summary: Daily contact with patient to assess and evaluate symptoms and progress in treatment and Medication management Initiate- Risperdal 1 mg qHS for mood stabilization Continue -Buspirone 5 mg po bid for anxiety             Gabapentin 100 mg tid for pain/mood             Sertraline 50 mg po daily for anxiety and depression             Quetiapine 100 mg po discontinued due to low BP For HTN-Decrease Amlodipine to 5 mg po daily              Coreg tablet 3.125 mg po bid             Hydralazine 25 mg po tid For DM-, Per Diabetes  Coordinator, Insulin Glargine-yfgn decreased to 13 units Sub daily at bed time; Sliding scale Novolog 0-15 units TID w/ meals and 0-5 units qHS For Lactose Intolerance- Lact-Aid 6000 units     For diarrhea- Imodium PRN added to MAR.         Offer PRN Medications per protocol. Encourage group attendance and participation   Rosezetta Schlatter, MD, PGY-1  11/22/2020, 1:58 PM  I have reviewed the note by Dr. Earley Favor, and I am in agreement with the assessment and plan.  I have independently interviewed and assessed the patient.  Melba Coon, MD 11/22/2020

## 2020-11-22 NOTE — [facility] Group Notes (Signed)
[place] Group Notes:  (Nursing/MHT/Case Management/Adjunct)  Date:  11/22/2020  Time:  8:24 PM  Type of Therapy:   Wrap up group  Participation Level:  Active  Participation Quality:  Attentive and Sharing  Affect:  Appropriate  Cognitive:  Appropriate  Insight:  Appropriate and Improving  Engagement in Group:  Developing/Improving  Modes of Intervention:  Discussion  Summary of Progress/Problems: Client expressed that she was worried about going home tomorrow because she is homeless and she feels she hasn't been given adequate resources. Client was appropriate and open to share to the group.  Maxine Glenn 11/22/2020, 8:24 PM

## 2020-11-22 NOTE — [facility] Counselor (Signed)
CSW provided the Pt with additional resources for housing options that the Pt can inquire about after she begins receiving her income next month.

## 2020-11-22 NOTE — [facility] Group Notes (Signed)
Patient did not attend the Psycho-Ed group. 

## 2020-11-23 ENCOUNTER — Encounter (HOSPITAL_COMMUNITY): Payer: Self-pay | Admitting: Adult Health

## 2020-11-23 ENCOUNTER — Inpatient Hospital Stay (HOSPITAL_COMMUNITY): Payer: Medicaid Other

## 2020-11-23 ENCOUNTER — Inpatient Hospital Stay (HOSPITAL_COMMUNITY)
Admission: AD | Admit: 2020-11-23 | Payer: Medicaid Other | Source: Ambulatory Visit | Attending: Internal Medicine | Admitting: Internal Medicine

## 2020-11-23 ENCOUNTER — Other Ambulatory Visit: Payer: Self-pay

## 2020-11-23 DIAGNOSIS — F251 Schizoaffective disorder, depressive type: Secondary | ICD-10-CM | POA: Diagnosis not present

## 2020-11-23 LAB — COMPREHENSIVE METABOLIC PANEL
ALT: 19 U/L (ref 0–44)
AST: 20 U/L (ref 15–41)
Albumin: 3.4 g/dL — ABNORMAL LOW (ref 3.5–5.0)
Alkaline Phosphatase: 65 U/L (ref 38–126)
Anion gap: 6 (ref 5–15)
BUN: 68 mg/dL — ABNORMAL HIGH (ref 6–20)
CO2: 25 mmol/L (ref 22–32)
Calcium: 8.8 mg/dL — ABNORMAL LOW (ref 8.9–10.3)
Chloride: 106 mmol/L (ref 98–111)
Creatinine, Ser: 4.67 mg/dL — ABNORMAL HIGH (ref 0.44–1.00)
GFR, Estimated: 10 mL/min — ABNORMAL LOW (ref >60)
Glucose, Bld: 63 mg/dL — ABNORMAL LOW (ref 70–99)
Potassium: 4.7 mmol/L (ref 3.5–5.1)
Sodium: 137 mmol/L (ref 135–145)
Total Bilirubin: 0.9 mg/dL (ref 0.3–1.2)
Total Protein: 7.1 g/dL (ref 6.5–8.1)

## 2020-11-23 LAB — CBC WITH DIFFERENTIAL/PLATELET
Abs Immature Granulocytes: 0.02 10*3/uL (ref 0.00–0.07)
Basophils Absolute: 0 10*3/uL (ref 0.0–0.1)
Basophils Relative: 0 %
Eosinophils Absolute: 0.3 10*3/uL (ref 0.0–0.5)
Eosinophils Relative: 3 %
HCT: 25.4 % — ABNORMAL LOW (ref 36.0–46.0)
Hemoglobin: 8 g/dL — ABNORMAL LOW (ref 12.0–15.0)
Immature Granulocytes: 0 %
Lymphocytes Relative: 17 %
Lymphs Abs: 1.5 10*3/uL (ref 0.7–4.0)
MCH: 29.5 pg (ref 26.0–34.0)
MCHC: 31.5 g/dL (ref 30.0–36.0)
MCV: 93.7 fL (ref 80.0–100.0)
Monocytes Absolute: 1 10*3/uL (ref 0.1–1.0)
Monocytes Relative: 12 %
Neutro Abs: 5.6 10*3/uL (ref 1.7–7.7)
Neutrophils Relative %: 68 %
Platelets: 218 10*3/uL (ref 150–400)
RBC: 2.71 MIL/uL — ABNORMAL LOW (ref 3.87–5.11)
RDW: 13.6 % (ref 11.5–15.5)
WBC: 8.3 10*3/uL (ref 4.0–10.5)
nRBC: 0 % (ref 0.0–0.2)

## 2020-11-23 LAB — URINALYSIS, ROUTINE W REFLEX MICROSCOPIC
Bilirubin Urine: NEGATIVE
Glucose, UA: NEGATIVE mg/dL
Hgb urine dipstick: NEGATIVE
Ketones, ur: NEGATIVE mg/dL
Nitrite: NEGATIVE
Protein, ur: 100 mg/dL — AB
Specific Gravity, Urine: 1.012 (ref 1.005–1.030)
WBC, UA: >50 WBC/hpf — ABNORMAL HIGH (ref 0–5)
pH: 8 (ref 5.0–8.0)

## 2020-11-23 LAB — PROTIME-INR
INR: 1 (ref 0.8–1.2)
Prothrombin Time: 12.8 seconds (ref 11.4–15.2)

## 2020-11-23 LAB — CBG MONITORING, ED
Glucose-Capillary: 67 mg/dL — ABNORMAL LOW (ref 70–99)
Glucose-Capillary: 128 mg/dL — ABNORMAL HIGH (ref 70–99)
Glucose-Capillary: 105 mg/dL — ABNORMAL HIGH (ref 70–99)

## 2020-11-23 LAB — RESP PANEL BY RT-PCR (FLU A&B, COVID) ARPGX2
Influenza A by PCR: NEGATIVE
Influenza B by PCR: NEGATIVE
SARS Coronavirus 2 by RT PCR: NEGATIVE

## 2020-11-23 LAB — LACTIC ACID, PLASMA: Lactic Acid, Venous: 0.6 mmol/L (ref 0.5–1.9)

## 2020-11-23 LAB — GLUCOSE, CAPILLARY
Glucose-Capillary: 109 mg/dL — ABNORMAL HIGH (ref 70–99)
Glucose-Capillary: 81 mg/dL (ref 70–99)
Glucose-Capillary: 97 mg/dL (ref 70–99)

## 2020-11-23 LAB — APTT: aPTT: 29 seconds (ref 24–36)

## 2020-11-23 LAB — LIPASE, BLOOD: Lipase: 30 U/L (ref 11–51)

## 2020-11-23 LAB — AMMONIA: Ammonia: 17 umol/L (ref 9–35)

## 2020-11-23 MED ORDER — SODIUM CHLORIDE 0.9 % IV SOLN: 1.0000 g | Freq: Once | INTRAVENOUS | Status: DC

## 2020-11-23 MED ORDER — DEXTROSE 50 % IV SOLN
25.0000 g | Freq: Once | INTRAVENOUS | Status: AC
  Administered 2020-11-23: 25 g via INTRAVENOUS

## 2020-11-23 MED ORDER — SODIUM CHLORIDE 0.9 % IV SOLN
1.0000 g | Freq: Once | INTRAVENOUS | Status: DC
  Administered 2020-11-23: 1 g via INTRAVENOUS
  Filled 2020-11-23: qty 10

## 2020-11-23 MED ORDER — DEXTROSE 50 % IV SOLN
25.0000 g | Freq: Once | INTRAVENOUS | Status: DC
  Filled 2020-11-23: qty 50

## 2020-11-23 MED ORDER — SODIUM CHLORIDE 0.9 % IV SOLN: INTRAVENOUS | Status: DC

## 2020-11-23 NOTE — ED Provider Notes (Signed)
[facility] DEPT Provider Note   CSN: [id number] Arrival date & time: 11/23/20  1304     History Chief Complaint  Patient presents with   Diarrhea    Lorraine Turner is a 60 y.o. female.  Lorraine Turner is a 61 y.o. female with a history of schizophrenia, hypertension, hyperlipidemia, diabetes, bipolar disorder, who presents from behavioral health hospital for evaluation of fever, diarrhea and altered mental status.  Per behavioral health facility she has been having several days of diarrhea, patient estimates she has been having diarrhea for about a week.  Facility reports today she was found to have a fever of 102 this morning.  They also report that she was much more drowsy and less responsive today.  Did not get out of bed to come down for breakfast or lunch and kept falling asleep when trying to eat lunch in her room.  Facility staff report this is an acute change for her.  They did a respiratory panel at the facility which was negative.  Patient unable to provide much additional history, reports 1 episode of vomiting and diarrhea for started none since then, has not noted blood in her stool.  Denies associated abdominal pain and is unsure if her abdomen feels more distended than usual.  The history is provided by the patient and medical records.      Past Medical History:  Diagnosis Date   Anxiety    Bipolar 1 disorder ([place])    Depression    Diabetes mellitus without complication ([place])    Gallstones    Hyperlipidemia    Hypertension    MDD (major depressive disorder), recurrent episode, moderate ([place]) 11/18/2020   Neuropathy    Schizophrenia ([place])    Seizures ([place])    X1- febrile seizure as a child-none since   Suicide ideation 09/12/2018    Patient Active Problem List   Diagnosis Date Noted   Diarrhea 11/21/2020   Acute worsening of stage 4 chronic kidney disease ([place])    Hypertensive urgency 09/01/2020   CKD (chronic kidney  disease), stage III ([place]) 09/01/2020   DKA (diabetic ketoacidoses) 09/12/2018   AKI (acute kidney injury) ([place]) 09/12/2018   Adjustment disorder with mixed disturbance of emotions and conduct    MDD (major depressive disorder), severe ([place]) 57/32/2025   Umbilical hernia with obstruction 11/24/2015   Symptomatic cholelithiasis 11/24/2015   Type 2 diabetes mellitus with hyperglycemia ([place])    Suicide attempt ([place]) 05/06/2014   Schizoaffective disorder, depressive type ([place]) 08/14/2011   UNSPECIFIED ANEMIA 01/23/2009   Primary hypertension 10/12/2008   HYPERCHOLESTEROLEMIA 01/01/2007   PANIC DISORDER 01/01/2007   Obsessive compulsive disorder 01/01/2007    Past Surgical History:  Procedure Laterality Date   CESAREAN SECTION  05/1998   X 1   CHOLECYSTECTOMY N/A 03/27/2016   Procedure: LAPAROSCOPIC CHOLECYSTECTOMY;  Surgeon: Olean Ree, MD;  Location: [facility] ORS;  Service: General;  Laterality: N/A;   TONSILLECTOMY AND ADENOIDECTOMY     age 49   TUBAL LIGATION       OB History   No obstetric history on file.     Family History  Problem Relation Age of Onset   Diabetes Mother    Heart disease Mother    Kidney disease Mother    Stroke Mother    Hyperlipidemia Mother    Diabetes Maternal Aunt     Social History   Tobacco Use   Smoking status: Former    Types: Cigarettes  Quit date: 03/14/2013    Years since quitting: 7.7   Smokeless tobacco: Never   Tobacco comments:    1 cigarette1-2 times year, only when she gets stressed  Vaping Use   Vaping Use: Never used  Substance Use Topics   Alcohol use: Yes    Alcohol/week: 1.0 standard drink    Types: 1 Cans of beer per week    Comment: One can of beer every 2-3 months.    Drug use: No    Home Medications Prior to Admission medications   Medication Sig Start Date End Date Taking? Authorizing Provider  amLODipine (NORVASC) 10 MG tablet Take 1 tablet (10 mg total) by mouth daily. Patient not taking: Reported on  11/17/2020 09/06/20   Shawna Clamp, MD  Blood Glucose Monitoring Suppl (TRUE METRIX METER) w/Device KIT 1 Device by Does not apply route 3 (three) times daily. For blood sugar checks 09/13/18   Barb Merino, MD  busPIRone (BUSPAR) 5 MG tablet Take 1 tablet (5 mg total) by mouth 2 (two) times daily. Patient not taking: Reported on 11/17/2020 09/05/20   Shawna Clamp, MD  carvedilol (COREG) 3.125 MG tablet Take 1 tablet (3.125 mg total) by mouth 2 (two) times daily with a meal. Patient not taking: Reported on 11/17/2020 09/05/20   Shawna Clamp, MD  gabapentin (NEURONTIN) 100 MG capsule Take 1 capsule (100 mg total) by mouth 3 (three) times daily. Patient not taking: Reported on 11/17/2020 09/05/20 12/04/20  Shawna Clamp, MD  glipiZIDE (GLUCOTROL) 10 MG tablet Take 1 tablet (10 mg total) by mouth 2 (two) times daily before a meal. Patient not taking: Reported on 11/17/2020 09/05/20 10/05/20  Shawna Clamp, MD  glucose blood test strip Use as instructed 10/28/18   Argentina Donovan, PA-C  hydrALAZINE (APRESOLINE) 25 MG tablet Take 1 tablet (25 mg total) by mouth 3 (three) times daily. Patient not taking: No sig reported 09/05/20   Shawna Clamp, MD  insulin glargine (LANTUS) 100 UNIT/ML injection Inject 0.15 mLs (15 Units total) into the skin at bedtime. Patient not taking: Reported on 11/17/2020 09/05/20   Shawna Clamp, MD  Insulin Syringe-Needle U-100 (SAFETY INSULIN SYRINGES) 30G X 5/16" 0.5 ML MISC 1 each by Does not apply route at bedtime. 10/21/19   Argentina Donovan, PA-C  QUEtiapine (SEROQUEL) 100 MG tablet Take 1 tablet (100 mg total) by mouth at bedtime. For mood control Patient not taking: No sig reported 09/05/20   Shawna Clamp, MD  sertraline (ZOLOFT) 50 MG tablet Take 1 tablet (50 mg total) by mouth daily. Patient not taking: Reported on 11/17/2020 09/06/20   Shawna Clamp, MD  TRUEplus Lancets 28G MISC 1 each by Does not apply route 3 (three) times daily. For blood sugar checks 09/13/18    Barb Merino, MD    Allergies    Lisinopril and Amoxicillin  Review of Systems   Review of Systems  Constitutional:  Positive for activity change, fatigue and fever. Negative for chills.  HENT:  Negative for congestion, rhinorrhea and sore throat.   Respiratory:  Negative for cough and shortness of breath.   Cardiovascular:  Negative for chest pain.  Gastrointestinal:  Positive for diarrhea. Negative for abdominal distention, abdominal pain, nausea and vomiting.  Genitourinary:  Negative for dysuria.  Musculoskeletal:  Negative for arthralgias and myalgias.  Skin:  Negative for color change and rash.  Neurological:  Negative for syncope.  All other systems reviewed and are negative.  Physical Exam Updated Vital Signs BP (!) 114/50  Pulse (!) 54   Temp 98.4 F (36.9 C) (Oral)   Resp 16   Ht '5\' 2"'  (1.575 m)   Wt 61.5 kg   SpO2 98%   BMI 24.78 kg/m   Physical Exam Vitals and nursing note reviewed.  Constitutional:      General: She is not in acute distress.    Appearance: Normal appearance. She is well-developed. She is ill-appearing. She is not diaphoretic.     Comments: Alert but somewhat lethargic, able to answer most questions  HENT:     Head: Normocephalic and atraumatic.     Mouth/Throat:     Mouth: Mucous membranes are dry.  Eyes:     General:        Right eye: No discharge.        Left eye: No discharge.  Cardiovascular:     Rate and Rhythm: Normal rate and regular rhythm.     Pulses: Normal pulses.     Heart sounds: Normal heart sounds.  Pulmonary:     Effort: Pulmonary effort is normal. No respiratory distress.     Breath sounds: Normal breath sounds. No wheezing or rales.     Comments: Respirations equal and unlabored, patient able to speak in full sentences, lungs clear to auscultation bilaterally  Abdominal:     General: Bowel sounds are normal. There is distension.     Palpations: Abdomen is soft. There is no mass.     Tenderness: There is no  abdominal tenderness. There is no guarding.     Comments: Abdomen soft and mildly distended, patient denies any tenderness to palpation throughout.  Musculoskeletal:        General: No deformity.     Cervical back: Neck supple.     Right lower leg: No edema.     Left lower leg: No edema.  Skin:    General: Skin is warm and dry.     Capillary Refill: Capillary refill takes less than 2 seconds.  Neurological:     Mental Status: She is alert and oriented to person, place, and time.     Coordination: Coordination normal.     Comments: Alert but somewhat somnolent, speech is muffled, able to answer most questions CN III-XII intact Normal strength in upper and lower extremities bilaterally including dorsiflexion and plantar flexion, strong and equal grip strength Sensation intact in all extremities  Psychiatric:        Mood and Affect: Mood normal.        Behavior: Behavior normal.    ED Results / Procedures / Treatments   Labs (all labs ordered are listed, but only abnormal results are displayed) Labs Reviewed  GLUCOSE, CAPILLARY - Abnormal; Notable for the following components:      Result Value   Glucose-Capillary 156 (*)    All other components within normal limits  LIPID PANEL - Abnormal; Notable for the following components:   LDL Cholesterol 119 (*)    All other components within normal limits  GLUCOSE, CAPILLARY - Abnormal; Notable for the following components:   Glucose-Capillary 138 (*)    All other components within normal limits  GLUCOSE, CAPILLARY - Abnormal; Notable for the following components:   Glucose-Capillary 209 (*)    All other components within normal limits  GLUCOSE, CAPILLARY - Abnormal; Notable for the following components:   Glucose-Capillary 213 (*)    All other components within normal limits  HEMOGLOBIN A1C - Abnormal; Notable for the following components:   Hgb A1c MFr  Bld 8.3 (*)    All other components within normal limits  GLUCOSE, CAPILLARY -  Abnormal; Notable for the following components:   Glucose-Capillary 232 (*)    All other components within normal limits  GLUCOSE, CAPILLARY - Abnormal; Notable for the following components:   Glucose-Capillary 201 (*)    All other components within normal limits  GLUCOSE, CAPILLARY - Abnormal; Notable for the following components:   Glucose-Capillary 162 (*)    All other components within normal limits  GLUCOSE, CAPILLARY - Abnormal; Notable for the following components:   Glucose-Capillary 182 (*)    All other components within normal limits  GLUCOSE, CAPILLARY - Abnormal; Notable for the following components:   Glucose-Capillary 147 (*)    All other components within normal limits  GLUCOSE, CAPILLARY - Abnormal; Notable for the following components:   Glucose-Capillary 125 (*)    All other components within normal limits  GLUCOSE, CAPILLARY - Abnormal; Notable for the following components:   Glucose-Capillary 182 (*)    All other components within normal limits  GLUCOSE, CAPILLARY - Abnormal; Notable for the following components:   Glucose-Capillary 56 (*)    All other components within normal limits  GLUCOSE, CAPILLARY - Abnormal; Notable for the following components:   Glucose-Capillary 57 (*)    All other components within normal limits  GLUCOSE, CAPILLARY - Abnormal; Notable for the following components:   Glucose-Capillary 133 (*)    All other components within normal limits  GLUCOSE, CAPILLARY - Abnormal; Notable for the following components:   Glucose-Capillary 155 (*)    All other components within normal limits  GLUCOSE, CAPILLARY - Abnormal; Notable for the following components:   Glucose-Capillary 106 (*)    All other components within normal limits  GLUCOSE, CAPILLARY - Abnormal; Notable for the following components:   Glucose-Capillary 124 (*)    All other components within normal limits  GLUCOSE, CAPILLARY - Abnormal; Notable for the following components:    Glucose-Capillary 163 (*)    All other components within normal limits  GLUCOSE, CAPILLARY - Abnormal; Notable for the following components:   Glucose-Capillary 109 (*)    All other components within normal limits  RESP PANEL BY RT-PCR (FLU A&B, COVID) ARPGX2  CULTURE, BLOOD (ROUTINE X 2)  CULTURE, BLOOD (ROUTINE X 2)  C DIFFICILE QUICK SCREEN W PCR REFLEX    GASTROINTESTINAL PANEL BY PCR, STOOL (REPLACES STOOL CULTURE)  TSH  GLUCOSE, CAPILLARY  GLUCOSE, CAPILLARY  GLUCOSE, CAPILLARY  GLUCOSE, CAPILLARY  GLUCOSE, CAPILLARY  GLUCOSE, CAPILLARY  GLUCOSE, CAPILLARY  GLUCOSE, CAPILLARY  GLUCOSE, CAPILLARY  LACTIC ACID, PLASMA  LACTIC ACID, PLASMA  COMPREHENSIVE METABOLIC PANEL  CBC WITH DIFFERENTIAL/PLATELET  PROTIME-INR  APTT  URINALYSIS, ROUTINE W REFLEX MICROSCOPIC  LIPASE, BLOOD  AMMONIA    EKG None  Radiology  DG Chest 2 View  Result Date: 11/23/2020 CLINICAL DATA:  Questionable sepsis. EXAM: CHEST - 2 VIEW COMPARISON:  Chest x-ray 11/16/2020, CT chest 09/20/2014 FINDINGS: The heart and mediastinal contours are unchanged. No focal consolidation. No pulmonary edema. Trace right pleural effusion. No pneumothorax. No acute osseous abnormality. IMPRESSION: Interval development of a trace right pleural effusion. Electronically Signed   By: Iven Finn M.D.   On: 11/23/2020 15:27    Procedures Procedures   Medications Ordered in ED Medications  carvedilol (COREG) tablet 3.125 mg (3.125 mg Oral Given 11/23/20 1014)  hydrALAZINE (APRESOLINE) tablet 25 mg (25 mg Oral Given 11/23/20 1207)  busPIRone (BUSPAR) tablet 5 mg (5 mg Oral  Given 11/23/20 0741)  gabapentin (NEURONTIN) capsule 100 mg (100 mg Oral Given 11/23/20 1207)  acetaminophen (TYLENOL) tablet 650 mg (650 mg Oral Given 11/23/20 0739)  alum & mag hydroxide-simeth (MAALOX/MYLANTA) 200-200-20 MG/5ML suspension 30 mL (has no administration in time range)  magnesium hydroxide (MILK OF MAGNESIA) suspension 30 mL  (30 mLs Oral Given 11/18/20 1839)  insulin aspart (novoLOG) injection 0-15 Units (0 Units Subcutaneous Not Given 11/23/20 1207)  insulin aspart (novoLOG) injection 0-5 Units (0 Units Subcutaneous Not Given 11/22/20 2101)  amLODipine (NORVASC) tablet 5 mg (5 mg Oral Given 11/23/20 1014)  feeding supplement (GLUCERNA SHAKE) (GLUCERNA SHAKE) liquid 237 mL (237 mLs Oral Given 11/23/20 1208)  multivitamin with minerals tablet 1 tablet (1 tablet Oral Given 11/23/20 0739)  melatonin tablet 5 mg (5 mg Oral Given 11/22/20 2100)  risperiDONE (RISPERDAL) tablet 1 mg (1 mg Oral Given 11/22/20 2100)  lactase (LACTAID) tablet 6,000 Units (6,000 Units Oral Given 11/23/20 1207)  loperamide (IMODIUM) capsule 2 mg (2 mg Oral Given 11/22/20 1315)  hydrOXYzine (ATARAX/VISTARIL) tablet 25 mg (25 mg Oral Given 11/22/20 1549)  sertraline (ZOLOFT) tablet 50 mg (50 mg Oral Given 11/22/20 0736)  loperamide (IMODIUM) capsule 2 mg (2 mg Oral Given 11/20/20 1340)  insulin glargine-yfgn (SEMGLEE) injection 13 Units (13 Units Subcutaneous Given 11/22/20 2102)    ED Course  I have reviewed the triage vital signs and the nursing notes.  Pertinent labs & imaging results that were available during my care of the patient were reviewed by me and considered in my medical decision making (see chart for details).    MDM Rules/Calculators/A&P                           60 year old female arrives from behavioral health hospital for evaluation of fever, diarrhea and altered mental status.  On arrival patient is ill-appearing but hemodynamically stable.  She does appear somewhat lethargic but is able to answer my questions.  Was not willing to get up at behavioral health today.  Febrile to 102 at facility with negative COVID and flu testing.  Will initiate broad work-up with basic labs, blood cultures, lactic acid, ammonia, chest x-ray, CT abdomen pelvis assess for potential cause for patient's symptoms.  On exam patient has some  mild abdominal distention without significant tenderness.  Concern for potential renal versus liver disease,also ordered C. difficile and GI pathogen panel given persistent diarrhea now with fever.  Chest x-ray with trace right pleural effusion, but no other active cardiopulmonary disease.  Patient CT and blood work is pending at shift change.  Care signed out to PA Theodis Blaze who will follow up on pending lab work, suspect patient may need medical admission in the setting of fever and diarrhea.  Final Clinical Impression(s) / ED Diagnoses Final diagnoses:  Fever Diarrhea AMS    Rx / DC Orders ED Discharge Orders     None        Janet Berlin 11/23/20 1622    Malvin Johns, MD 11/24/20 [phone]

## 2020-11-23 NOTE — [facility] Suicide Risk Assessment (Addendum)
[facility] Discharge Suicide Risk Assessment   Principal Problem: schizoaffective d/o depressive type Discharge Diagnoses:  OCD HTN Diarrhea Fever Type 2 DM    Total Time Spent in Direct Patient Care:  I personally spent 30 minutes on the unit in direct patient care. The direct patient care time included face-to-face time with the patient, reviewing the patient's chart, communicating with other professionals, and coordinating care. Greater than 50% of this time was spent in counseling or coordinating care with the patient regarding goals of hospitalization, psycho-education, and discharge planning needs.   Musculoskeletal: Strength & Muscle Tone:  unassessed - uncooperative for testing Gait & Station:  unassessed - in bed Patient leans: N/A  Psychiatric Specialty Exam: Physical Exam Vitals reviewed.  HENT:     Head: Normocephalic.  Pulmonary:     Effort: Pulmonary effort is normal.  Abdominal:     Palpations: Abdomen is soft.     Comments: Distended, non-tender to palpation    Review of Systems - uncooperative for questioning  There were no vitals taken for this visit.There is no height or weight on file to calculate BMI.  General Appearance:  casually dressed, fair hygiene  Eye Contact:  Minimal  Speech:  slow - minimally conversant with direct questioning  Volume:  Decreased  Mood:   unable to assess  - patient uncooperative for questioning   Affect:  Constricted  Thought Process:   Orientation:  uncooperative for testing  Thought Content:  unable to assess  - patient uncooperative for questioning   Suicidal Thoughts:  No  Homicidal Thoughts:  No  Memory:   limited   Judgement:  Impaired  Insight:  Lacking  Psychomotor Activity:  Decreased  Concentration:  Concentration: Poor and Attention Span: Poor  Recall:  Limited  Fund of Knowledge:   unable to assess  Language:  Poor  Akathisia:  Negative  Assets:  Desire for Improvement Resilience  ADL's:  Intact  Cognition:   Impaired,  Mild   Mental Status Per Nursing Assessment::   On Admission:     Demographic Factors:  Low socioeconomic status, Living alone, and Unemployed  Loss Factors: Decrease in vocational status, Loss of significant relationship, Decline in physical health, and Financial problems/change in socioeconomic status  Historical Factors: Family history of mental illness or substance abuse  Risk Reduction Factors:   NA  Continued Clinical Symptoms:  Depression:   Anhedonia Hopelessness Severe Previous Psychiatric Diagnoses and Treatments Schizoaffective d/o depressed type DX  Cognitive Features That Contribute To Risk:  Loss of executive function    Suicide Risk:  Severe: severe dysphoria/symptomatology, multiple risk factors present, and few if any protective factors, particularly a lack of social support.   Plan Of Care/Follow-up recommendations:  Other:  Patient to be admitted medically at this time after transfer from [facility] to ED. Would continue sitter and suicide precautions. Would consult psych CL team to follow during medical admission.  Harlow Asa, MD, FAPA 11/23/2020, 6:18 PM

## 2020-11-23 NOTE — [facility] Group Notes (Signed)
Patient did not attend orientation/goal group.

## 2020-11-23 NOTE — ED Provider Notes (Signed)
Patient had to be discharged out of the system and readmitted because she was inpatient status at behavioral health.  She did not actually discharge and come back.  Please see my previous notes for her work-up.  She is being admitted to room 1617 here at [facility].  She was accepted by Dr. Pietro Cassis.  While she was down here we inserted a Foley catheter, I ordered IV Rocephin for presumed UTI and gave her 25 g of D50 given that her blood sugar was 60.  She was not altered or symptomatic at the time however we will keep her n.p.o. until admitting physician orders dietary status.  She is hemodynamically stable and being prepared for transfer upstairs at this time   Bing Matter 11/23/20 1917    Carmin Muskrat, MD 11/23/20 [phone]

## 2020-11-23 NOTE — ED Provider Notes (Signed)
Discussed when she is to call difficulties with Dr. Vanita Panda for bed placement and epic issues.  Dr. Vanita Panda spoke with admitting physician Dr. Hal Hope.  The patient has a bed available upstairs, however she will need a sitter, which would not be available until 11 PM before she is able to go up to the room.  He is unable to place admitting orders at this time however he is aware that she is to be admitted and he will be able to place orders when she gets up to her room.   Mickie Hillier, PA-C 11/23/20 2147    Carmin Muskrat, MD 11/23/20 [phone]

## 2020-11-23 NOTE — ED Notes (Signed)
Patient transported to CT 

## 2020-11-23 NOTE — ED Notes (Signed)
Had pulled patients PO medication from pyxis that was due at 1700 and went in to medicate patient and place foley. Noted that registration had removed and discharged patient from our system. Was unable to chart on patient, verify orders or give medications. Charge nurse informed. Chart was opened back up. I was told to give Rocephin and insert foley and return PO medications back to pyxis and they would have to give them upstairs. Charge nurse spoke with Levin Erp RN on 6 E and informed them of the patients status and that foley was being inserted, Rocephin being hung IV per ED orders and patient would be coming up with PO medications not given.

## 2020-11-23 NOTE — ED Triage Notes (Signed)
Patient here from [facility] reporting diarrhea today. Notes report that patient was unresponsive today, lethargic, and had weakness.

## 2020-11-23 NOTE — Progress Notes (Addendum)
[facility] MD Progress Note  11/23/2020 6:10 PM Lorraine Turner  MRN:  [id number] Subjective:   Reason for admission: Lorraine Turner is a 60 year old female with a psychiatric history of schizoaffective disorder-depressive type, OCD, previous suicide attempts, and MDD-recurrent/severe who was admitted  from [facility] ER for suicide ideation and severe depression.   Patient reported that she has been feeling hopeless and helpless since her husband died.  Patient reported that she lost her home to foreclosure and since then has been homeless, living here and there and some times in the shelter.    Objective note:  Record is reviewed, nursing report received and care reviewed with members of our interdisciplinary team.   Patient was seen, assessed, and discussed with attending, Dr. Nelda Marseille.    Patient was febrile to 102 deg, per RN this AM. Patient was given Tylenol 650 mg. Repeat vitals, temp was unable to be detected. Resp panel completed, and patient COVID neg. On assessment, she was confused and minimally responsive, often with blank stare. Unable to state her name, but said to this writer, "Yes, I remember you." Repeat POC glucose was 97.    Upon recheck, patient remained drowsy, stating that she hadn't had abdominal pain before drifting back to sleep.    Principal Problem: Schizoaffective disorder, depressive type ([place]) Diagnosis: Principal Problem:   Schizoaffective disorder, depressive type ([place]) Active Problems:   Obsessive compulsive disorder   Primary hypertension   Type 2 diabetes mellitus with hyperglycemia ([place])   Diarrhea  Total Time spent with patient: I personally spent 35 minutes on the unit in direct patient care. The direct patient care time included face-to-face time with the patient, reviewing the patient's chart, communicating with other professionals, and coordinating care. Greater than 50% of this time was spent in counseling or coordinating care with the patient regarding goals  of hospitalization, psycho-education, and discharge planning needs.   Past Psychiatric History: see H&P Note  Past Medical History:  Past Medical History:  Diagnosis Date   Anxiety    Bipolar 1 disorder ([place])    Depression    Diabetes mellitus without complication ([place])    Gallstones    Hyperlipidemia    Hypertension    MDD (major depressive disorder), recurrent episode, moderate ([place]) 11/18/2020   Neuropathy    Schizophrenia ([place])    Seizures (HCC)    X1- febrile seizure as a child-none since   Suicide ideation 09/12/2018    Past Surgical History:  Procedure Laterality Date   CESAREAN SECTION  05/1998   X 1   CHOLECYSTECTOMY N/A 03/27/2016   Procedure: LAPAROSCOPIC CHOLECYSTECTOMY;  Surgeon: Olean Ree, MD;  Location: [facility] ORS;  Service: General;  Laterality: N/A;   TONSILLECTOMY AND ADENOIDECTOMY     age 61   TUBAL LIGATION     Family History:  Family History  Problem Relation Age of Onset   Diabetes Mother    Heart disease Mother    Kidney disease Mother    Stroke Mother    Hyperlipidemia Mother    Diabetes Maternal Aunt    Family Psychiatric  History: see H&P Note Social History:  Social History   Substance and Sexual Activity  Alcohol Use Yes   Alcohol/week: 1.0 standard drink   Types: 1 Cans of beer per week   Comment: One can of beer every 2-3 months.      Social History   Substance and Sexual Activity  Drug Use No    Social History   Socioeconomic History  Marital status: Married    Spouse name: Not on file   Number of children: Not on file   Years of education: Not on file   Highest education level: Not on file  Occupational History   Not on file  Tobacco Use   Smoking status: Former    Types: Cigarettes    Quit date: 03/14/2013    Years since quitting: 7.7   Smokeless tobacco: Never   Tobacco comments:    1 cigarette1-2 times year, only when she gets stressed  Vaping Use   Vaping Use: Never used  Substance and Sexual Activity    Alcohol use: Yes    Alcohol/week: 1.0 standard drink    Types: 1 Cans of beer per week    Comment: One can of beer every 2-3 months.    Drug use: No   Sexual activity: Not Currently  Other Topics Concern   Not on file  Social History Narrative   ** Merged History Encounter **       Social Determinants of Health   Financial Resource Strain: Not on file  Food Insecurity: Not on file  Transportation Needs: Not on file  Physical Activity: Not on file  Stress: Not on file  Social Connections: Not on file   Additional Social History:       Sleep: Fair  Appetite:  Good  Current Medications: Current Facility-Administered Medications  Medication Dose Route Frequency Provider Last Rate Last Admin   0.9 %  sodium chloride infusion   Intravenous Continuous Dahal, Binaya, MD       acetaminophen (TYLENOL) tablet 650 mg  650 mg Oral Q6H PRN Bobbitt, Shalon E, NP   650 mg at 11/23/20 0739   alum & mag hydroxide-simeth (MAALOX/MYLANTA) 200-200-20 MG/5ML suspension 30 mL  30 mL Oral Q4H PRN Bobbitt, Shalon E, NP       amLODipine (NORVASC) tablet 5 mg  5 mg Oral Daily Onuoha, Josephine C, NP   5 mg at 11/23/20 1014   busPIRone (BUSPAR) tablet 5 mg  5 mg Oral BID Bobbitt, Shalon E, NP   5 mg at 11/23/20 0741   carvedilol (COREG) tablet 3.125 mg  3.125 mg Oral BID WC Bobbitt, Shalon E, NP   3.125 mg at 11/23/20 1014   dextrose 50 % solution 25 g  25 g Intravenous Once Autry, Lauren E, PA-C       feeding supplement (GLUCERNA SHAKE) (GLUCERNA SHAKE) liquid 237 mL  237 mL Oral BID BM Singleton, Amy E, MD   237 mL at 11/23/20 1208   gabapentin (NEURONTIN) capsule 100 mg  100 mg Oral TID Bobbitt, Shalon E, NP   100 mg at 11/23/20 1207   hydrALAZINE (APRESOLINE) tablet 25 mg  25 mg Oral TID Bobbitt, Shalon E, NP   25 mg at 11/23/20 1207   hydrOXYzine (ATARAX/VISTARIL) tablet 25 mg  25 mg Oral TID PRN Lavella Hammock, MD   25 mg at 11/22/20 1549   insulin aspart (novoLOG) injection 0-15 Units  0-15  Units Subcutaneous TID WC Briant Cedar, MD   3 Units at 11/21/20 1709   insulin aspart (novoLOG) injection 0-5 Units  0-5 Units Subcutaneous QHS Briant Cedar, MD   2 Units at 11/18/20 2219   lactase (LACTAID) tablet 6,000 Units  6,000 Units Oral TID WC Rosezetta Schlatter, MD   6,000 Units at 11/23/20 1207   loperamide (IMODIUM) capsule 2 mg  2 mg Oral PRN Rosezetta Schlatter, MD   2 mg  at 11/22/20 1315   magnesium hydroxide (MILK OF MAGNESIA) suspension 30 mL  30 mL Oral Daily PRN Bobbitt, Shalon E, NP   30 mL at 11/18/20 1839   melatonin tablet 5 mg  5 mg Oral QHS Hill, Jackie Plum, MD   5 mg at 11/22/20 2100   multivitamin with minerals tablet 1 tablet  1 tablet Oral Daily Harlow Asa, MD   1 tablet at 11/23/20 0739   risperiDONE (RISPERDAL) tablet 1 mg  1 mg Oral QHS Hill, Jackie Plum, MD   1 mg at 11/22/20 2100   Current Outpatient Medications  Medication Sig Dispense Refill   amLODipine (NORVASC) 10 MG tablet Take 1 tablet (10 mg total) by mouth daily. (Patient not taking: Reported on 11/17/2020) 30 tablet 1   Blood Glucose Monitoring Suppl (TRUE METRIX METER) w/Device KIT 1 Device by Does not apply route 3 (three) times daily. For blood sugar checks 1 kit 0   busPIRone (BUSPAR) 5 MG tablet Take 1 tablet (5 mg total) by mouth 2 (two) times daily. (Patient not taking: Reported on 11/17/2020) 60 tablet 1   carvedilol (COREG) 3.125 MG tablet Take 1 tablet (3.125 mg total) by mouth 2 (two) times daily with a meal. (Patient not taking: Reported on 11/17/2020) 60 tablet 1   gabapentin (NEURONTIN) 100 MG capsule Take 1 capsule (100 mg total) by mouth 3 (three) times daily. (Patient not taking: Reported on 11/17/2020) 90 capsule 2   glipiZIDE (GLUCOTROL) 10 MG tablet Take 1 tablet (10 mg total) by mouth 2 (two) times daily before a meal. (Patient not taking: Reported on 11/17/2020) 60 tablet 1   glucose blood test strip Use as instructed 100 each 12   hydrALAZINE (APRESOLINE) 25 MG  tablet Take 1 tablet (25 mg total) by mouth 3 (three) times daily. (Patient not taking: No sig reported) 90 tablet 1   insulin glargine (LANTUS) 100 UNIT/ML injection Inject 0.15 mLs (15 Units total) into the skin at bedtime. (Patient not taking: Reported on 11/17/2020) 10 mL 1   Insulin Syringe-Needle U-100 (SAFETY INSULIN SYRINGES) 30G X 5/16" 0.5 ML MISC 1 each by Does not apply route at bedtime. 100 each 3   QUEtiapine (SEROQUEL) 100 MG tablet Take 1 tablet (100 mg total) by mouth at bedtime. For mood control (Patient not taking: No sig reported) 30 tablet 1   sertraline (ZOLOFT) 50 MG tablet Take 1 tablet (50 mg total) by mouth daily. (Patient not taking: Reported on 11/17/2020) 30 tablet 1   TRUEplus Lancets 28G MISC 1 each by Does not apply route 3 (three) times daily. For blood sugar checks 100 each 12    Lab Results:  Results for orders placed or performed during the hospital encounter of 11/18/20 (from the past 48 hour(s))  Glucose, capillary     Status: None   Collection Time: 11/21/20  8:45 PM  Result Value Ref Range   Glucose-Capillary 76 70 - 99 mg/dL    Comment: Glucose reference range applies only to samples taken after fasting for at least 8 hours.  Glucose, capillary     Status: None   Collection Time: 11/22/20  6:04 AM  Result Value Ref Range   Glucose-Capillary 95 70 - 99 mg/dL    Comment: Glucose reference range applies only to samples taken after fasting for at least 8 hours.  Glucose, capillary     Status: None   Collection Time: 11/22/20  7:01 AM  Result Value Ref Range   Glucose-Capillary  71 70 - 99 mg/dL    Comment: Glucose reference range applies only to samples taken after fasting for at least 8 hours.  Glucose, capillary     Status: Abnormal   Collection Time: 11/22/20 12:05 PM  Result Value Ref Range   Glucose-Capillary 106 (H) 70 - 99 mg/dL    Comment: Glucose reference range applies only to samples taken after fasting for at least 8 hours.  Glucose,  capillary     Status: Abnormal   Collection Time: 11/22/20  5:12 PM  Result Value Ref Range   Glucose-Capillary 124 (H) 70 - 99 mg/dL    Comment: Glucose reference range applies only to samples taken after fasting for at least 8 hours.   Comment 1 Notify RN    Comment 2 Document in Chart   Glucose, capillary     Status: None   Collection Time: 11/22/20  7:10 PM  Result Value Ref Range   Glucose-Capillary 85 70 - 99 mg/dL    Comment: Glucose reference range applies only to samples taken after fasting for at least 8 hours.   Comment 1 Notify RN    Comment 2 Document in Chart   Glucose, capillary     Status: Abnormal   Collection Time: 11/22/20  8:26 PM  Result Value Ref Range   Glucose-Capillary 163 (H) 70 - 99 mg/dL    Comment: Glucose reference range applies only to samples taken after fasting for at least 8 hours.  Glucose, capillary     Status: Abnormal   Collection Time: 11/23/20  5:48 AM  Result Value Ref Range   Glucose-Capillary 109 (H) 70 - 99 mg/dL    Comment: Glucose reference range applies only to samples taken after fasting for at least 8 hours.  Resp Panel by RT-PCR (Flu A&B, Covid) Nasopharyngeal Swab     Status: None   Collection Time: 11/23/20  7:55 AM   Specimen: Nasopharyngeal Swab; Nasopharyngeal(NP) swabs in vial transport medium  Result Value Ref Range   SARS Coronavirus 2 by RT PCR NEGATIVE NEGATIVE    Comment: (NOTE) SARS-CoV-2 target nucleic acids are NOT DETECTED.  The SARS-CoV-2 RNA is generally detectable in upper respiratory specimens during the acute phase of infection. The lowest concentration of SARS-CoV-2 viral copies this assay can detect is 138 copies/mL. A negative result does not preclude SARS-Cov-2 infection and should not be used as the sole basis for treatment or other patient management decisions. A negative result may occur with  improper specimen collection/handling, submission of specimen other than nasopharyngeal swab, presence of  viral mutation(s) within the areas targeted by this assay, and inadequate number of viral copies(<138 copies/mL). A negative result must be combined with clinical observations, patient history, and epidemiological information. The expected result is Negative.  Fact Sheet for Patients:  EntrepreneurPulse.com.au  Fact Sheet for Healthcare Providers:  IncredibleEmployment.be  This test is no t yet approved or cleared by the [place] FDA and  has been authorized for detection and/or diagnosis of SARS-CoV-2 by FDA under an Emergency Use Authorization (EUA). This EUA will remain  in effect (meaning this test can be used) for the duration of the COVID-19 declaration under Section 564(b)(1) of the Act, 21 U.S.C.section 360bbb-3(b)(1), unless the authorization is terminated  or revoked sooner.       Influenza A by PCR NEGATIVE NEGATIVE   Influenza B by PCR NEGATIVE NEGATIVE    Comment: (NOTE) The Xpert Xpress SARS-CoV-2/FLU/RSV plus assay is intended as an aid in the  diagnosis of influenza from Nasopharyngeal swab specimens and should not be used as a sole basis for treatment. Nasal washings and aspirates are unacceptable for Xpert Xpress SARS-CoV-2/FLU/RSV testing.  Fact Sheet for Patients: EntrepreneurPulse.com.au  Fact Sheet for Healthcare Providers: IncredibleEmployment.be  This test is not yet approved or cleared by the [place] FDA and has been authorized for detection and/or diagnosis of SARS-CoV-2 by FDA under an Emergency Use Authorization (EUA). This EUA will remain in effect (meaning this test can be used) for the duration of the COVID-19 declaration under Section 564(b)(1) of the Act, 21 U.S.C. section 360bbb-3(b)(1), unless the authorization is terminated or revoked.  Performed at [facility], [place] [address], [place], [place] [address]   Glucose, capillary      Status: None   Collection Time: 11/23/20  7:58 AM  Result Value Ref Range   Glucose-Capillary 81 70 - 99 mg/dL    Comment: Glucose reference range applies only to samples taken after fasting for at least 8 hours.  Glucose, capillary     Status: None   Collection Time: 11/23/20  9:48 AM  Result Value Ref Range   Glucose-Capillary 97 70 - 99 mg/dL    Comment: Glucose reference range applies only to samples taken after fasting for at least 8 hours.  Lactic acid, plasma     Status: None   Collection Time: 11/23/20  2:41 PM  Result Value Ref Range   Lactic Acid, Venous 0.6 0.5 - 1.9 mmol/L    Comment: Performed at [facility], [place] [address] [place], [place] [address]  Comprehensive metabolic panel     Status: Abnormal   Collection Time: 11/23/20  2:41 PM  Result Value Ref Range   Sodium 137 135 - 145 mmol/L   Potassium 4.7 3.5 - 5.1 mmol/L   Chloride 106 98 - 111 mmol/L   CO2 25 22 - 32 mmol/L   Glucose, Bld 63 (L) 70 - 99 mg/dL    Comment: Glucose reference range applies only to samples taken after fasting for at least 8 hours.   BUN 68 (H) 6 - 20 mg/dL   Creatinine, Ser 4.67 (H) 0.44 - 1.00 mg/dL   Calcium 8.8 (L) 8.9 - 10.3 mg/dL   Total Protein 7.1 6.5 - 8.1 g/dL   Albumin 3.4 (L) 3.5 - 5.0 g/dL   AST 20 15 - 41 U/L   ALT 19 0 - 44 U/L   Alkaline Phosphatase 65 38 - 126 U/L   Total Bilirubin 0.9 0.3 - 1.2 mg/dL   GFR, Estimated 10 (L) >60 mL/min    Comment: (NOTE) Calculated using the CKD-EPI Creatinine Equation (2021)    Anion gap 6 5 - 15    Comment: Performed at [facility], [place] [address], [place], [place] [address]  CBC WITH DIFFERENTIAL     Status: Abnormal   Collection Time: 11/23/20  2:41 PM  Result Value Ref Range   WBC 8.3 4.0 - 10.5 K/uL   RBC 2.71 (L) 3.87 - 5.11 MIL/uL   Hemoglobin 8.0 (L) 12.0 - 15.0 g/dL   HCT 25.4 (L) 36.0 - 46.0 %   MCV 93.7 80.0 - 100.0 fL   MCH 29.5 26.0 - 34.0 pg   MCHC 31.5 30.0 - 36.0 g/dL    RDW 13.6 11.5 - 15.5 %   Platelets 218 150 - 400 K/uL   nRBC 0.0 0.0 - 0.2 %   Neutrophils Relative % 68 %  Neutro Abs 5.6 1.7 - 7.7 K/uL   Lymphocytes Relative 17 %   Lymphs Abs 1.5 0.7 - 4.0 K/uL   Monocytes Relative 12 %   Monocytes Absolute 1.0 0.1 - 1.0 K/uL   Eosinophils Relative 3 %   Eosinophils Absolute 0.3 0.0 - 0.5 K/uL   Basophils Relative 0 %   Basophils Absolute 0.0 0.0 - 0.1 K/uL   Immature Granulocytes 0 %   Abs Immature Granulocytes 0.02 0.00 - 0.07 K/uL    Comment: Performed at [facility], [place] [address], [place], [place] [address]  Protime-INR     Status: None   Collection Time: 11/23/20  2:41 PM  Result Value Ref Range   Prothrombin Time 12.8 11.4 - 15.2 seconds   INR 1.0 0.8 - 1.2    Comment: (NOTE) INR goal varies based on device and disease states. Performed at [facility], [place] [address], [place], [place] [address]   APTT     Status: None   Collection Time: 11/23/20  2:41 PM  Result Value Ref Range   aPTT 29 24 - 36 seconds    Comment: Performed at [facility], [place] [address] [place], [place] [address]  Lipase, blood     Status: None   Collection Time: 11/23/20  2:41 PM  Result Value Ref Range   Lipase 30 11 - 51 U/L    Comment: Performed at [facility], [place] [address], [place], [place] [address]  Ammonia     Status: None   Collection Time: 11/23/20  2:41 PM  Result Value Ref Range   Ammonia 17 9 - 35 umol/L    Comment: Performed at [facility], [place] [address], [place], [place] [address]  Urinalysis, Routine w reflex microscopic Urine, Clean Catch     Status: Abnormal   Collection Time: 11/23/20  4:02 PM  Result Value Ref Range   Color, Urine YELLOW YELLOW   APPearance CLOUDY (A) CLEAR   Specific Gravity, Urine 1.012 1.005 - 1.030   pH 8.0 5.0 - 8.0   Glucose, UA NEGATIVE NEGATIVE mg/dL   Hgb urine dipstick NEGATIVE NEGATIVE   Bilirubin  Urine NEGATIVE NEGATIVE   Ketones, ur NEGATIVE NEGATIVE mg/dL   Protein, ur 100 (A) NEGATIVE mg/dL   Nitrite NEGATIVE NEGATIVE   Leukocytes,Ua LARGE (A) NEGATIVE   RBC / HPF 0-5 0 - 5 RBC/hpf   WBC, UA >50 (H) 0 - 5 WBC/hpf   Bacteria, UA RARE (A) NONE SEEN   WBC Clumps PRESENT    Budding Yeast PRESENT    Ca Oxalate Crys, UA PRESENT     Comment: Performed at [facility], [place] [address], [place], [place] [address]  CBG monitoring, ED     Status: Abnormal   Collection Time: 11/23/20  4:08 PM  Result Value Ref Range   Glucose-Capillary 67 (L) 70 - 99 mg/dL    Comment: Glucose reference range applies only to samples taken after fasting for at least 8 hours.    Blood Alcohol level:  Lab Results  Component Value Date   ETH <10 11/16/2020   ETH <10 70/62/3762    Metabolic Disorder Labs: Lab Results  Component Value Date   HGBA1C 8.3 (H) 11/19/2020   MPG 191.51 11/19/2020   MPG 189 09/01/2020   No results found for: PROLACTIN Lab Results  Component Value Date   CHOL 197 11/18/2020   TRIG 144 11/18/2020   HDL  49 11/18/2020   CHOLHDL 4.0 11/18/2020   VLDL 29 11/18/2020   LDLCALC 119 (H) 11/18/2020   LDLCALC 158 (H) 01/13/2019    Physical Findings:  Musculoskeletal: Strength & Muscle Tone: within normal limits Gait & Station: normal Patient leans: Front  Psychiatric Specialty Exam:  Presentation  General Appearance: Casual  Eye Contact:Minimal (Sometimes made eye contact, other times blank stare or eyes closed)  Speech:Slow; Clear and Coherent; Garbled (Sometimes clear, otherwise slow and garbled.)  Speech Volume:Decreased  Handedness:Right   Mood and Affect  Mood:-- (Patient did not state)  Affect:Inappropriate (Patient difficult to arouse, blank stares, minimally responsive to questions)   Thought Process  Thought Processes:-- (UTA)  Descriptions of Associations:-- (UTA)  Orientation:-- (UTA)  Thought Content:--  (UTA)  History of Schizophrenia/Schizoaffective disorder:Yes  Duration of Psychotic Symptoms:No data recorded Hallucinations:Hallucinations: -- (UTA)  Ideas of Reference:-- (UTA)  Suicidal Thoughts:Suicidal Thoughts: No (Does not endorse or deny SI but unable to contract for safety.)  Homicidal Thoughts:Homicidal Thoughts: -- (UTA)   Sensorium  Memory:-- (UTA)  Judgment:-- (UTA)  Insight:-- (UTA)   Executive Functions  Concentration:-- (UTA)  Attention Span:-- (UTA)  Recall:-- (UTA)  Fund of Knowledge:-- (UTA)  Language:-- (UTA)   Psychomotor Activity  Psychomotor Activity:Psychomotor Activity: Psychomotor Retardation; Decreased   Assets  Assets:-- (UTA)   Sleep  Sleep:Sleep: -- (UTA) Number of Hours of Sleep: 0 (Not documented)   Physical Exam: Physical Exam Vitals and nursing note reviewed.  Constitutional:      General: She is in acute distress.     Appearance: She is toxic-appearing.  HENT:     Head: Normocephalic and atraumatic.     Nose: Nose normal.  Abdominal:     Palpations: Abdomen is soft.     Tenderness: There is no guarding or rebound.  Musculoskeletal:     Cervical back: Normal range of motion.  Skin:    General: Skin is warm and dry.  Neurological:     Mental Status: She is alert. She is disoriented.   Review of Systems  Unable to perform ROS: Mental status change  Gastrointestinal:  Positive for diarrhea. Negative for abdominal pain.  Blood pressure (!) 148/62, pulse 61, temperature 98.4 F (36.9 C), temperature source Oral, resp. rate 15, height '5\' 2"'  (1.575 m), weight 61.5 kg, SpO2 99 %. Body mass index is 24.78 kg/m.  Treatment Plan Summary: Daily contact with patient to assess and evaluate symptoms and progress in treatment and Medication management Patient to [facility] for evaluation of diarrhea, fever, and confusion. She is not hypoglycemic at this time.   Continue -Risperdal 1 mg qHS for mood stabilization Buspirone 5 mg  po bid for anxiety             Gabapentin 100 mg tid for pain/mood             Sertraline 50 mg po daily for anxiety and depression             Quetiapine 100 mg po discontinued due to low BP   Appreciate all recs from [place] per [facility] admission: For HTN-Decrease Amlodipine to 5 mg po daily              Coreg tablet 3.125 mg po bid             Hydralazine 25 mg po tid For DM-, Per Diabetes Coordinator, Insulin Glargine-yfgn decreased to 13 units Sub daily at bed time; Sliding scale Novolog 0-15 units TID w/ meals and 0-5  units qHS For Lactose Intolerance- Lact-Aid 6000 units     For diarrhea- Imodium PRN added to MAR.         Offer PRN Medications per protocol.    Rosezetta Schlatter, MD PGY-1 11/23/2020 [facility] Department of Psychiatry

## 2020-11-23 NOTE — ED Notes (Signed)
Attempted to get patient's EKG and do suicide assessment not done on arrival. Patient currently in imagining.

## 2020-11-23 NOTE — ED Provider Notes (Signed)
Emergency Medicine Provider Triage Evaluation Note  Lorraine Turner , a 60 y.o. female  was evaluated in triage.  Pt complains of AMS, Fever Diarrhea. She has had several days of diarrhea. Febrile to 102 this am. Negative RESP panel. Lethargic and minimally reponsive today. Hx of CKD. Patient is currently in tx at [facility].  Review of Systems  Positive: diarrhea Negative: vomiting  Physical Exam  BP (!) 114/50   Pulse (!) 54   Temp 98.4 F (36.9 C) (Oral)   Resp 16   Ht '5\' 2"'$  (1.575 m)   Wt 61.5 kg   SpO2 98%   BMI 24.78 kg/m  Gen:   Awake, no distress   Resp:  Normal effort  MSK:   Moves extremities without difficulty  Other:  No abd ttp  Medical Decision Making  Medically screening exam initiated at 1:41 PM.  Appropriate orders placed.  Lorraine Turner was informed that the remainder of the evaluation will be completed by another provider, this initial triage assessment does not replace that evaluation, and the importance of remaining in the ED until their evaluation is complete.  Labs, noncon CT abd/pl  and cdiff ordered. Patient answering questions appropriately at this time. She is lethargic   Margarita Mail, PA-C 11/23/20 1344    Blanchie Dessert, MD 11/23/20 1650

## 2020-11-23 NOTE — ED Notes (Signed)
6th floor RN called reporting that [facility] (Joe) told us to hold on to room 10 in ED until further notice from [facility] W[facility] Gl[facility])  Reported to [employer]

## 2020-11-23 NOTE — ED Provider Notes (Signed)
Care assumed from [facility], [place].  Please see her full note and initial work-up.  Briefly this is a 60 year old female with a history of schizophrenia, hypertension who presents from behavioral health hospital for fever, diarrhea and altered mental status.  Per behavioral health she has had several days to a week of diarrhea.  This morning she was found to have a fever of 102 and that she was more drowsy. Physical Exam  BP (!) 114/50   Pulse (!) 54   Temp 98.4 F (36.9 C) (Oral)   Resp 16   Ht '5\' 2"'$  (1.575 m)   Wt 61.5 kg   SpO2 98%   BMI 24.78 kg/m   Physical Exam Vitals and nursing note reviewed.  HENT:     Head: Normocephalic and atraumatic.  Eyes:     General: No scleral icterus. Pulmonary:     Effort: Pulmonary effort is normal. No respiratory distress.  Skin:    Findings: No rash.  Neurological:     General: No focal deficit present.     Mental Status: She is alert.  Psychiatric:        Mood and Affect: Mood normal.        Behavior: Behavior normal.        Thought Content: Thought content normal.        Judgment: Judgment normal.   See Benedetto Goad, PA-C for physical exam.  However I have seen the patient and she is in no acute distress.  She is alert and oriented.  Her abdomen is distended but not peritonitic.  ED Course/Procedures  Initial labs with no leukocytosis, hemoglobin 8.0 down from 10 seven days ago. COVID, flu negative CMP with creatinine of 4.67 up from 3.28, 7 days ago BUN 32 --> 68 Glycemic test 63, I have ordered an amp of D50  X-ray with trace right pleural effusion CT abdomen pelvis with contrast with no acute findings however she has a large amount of urine in her bladder.  Concerned this may be a postobstructive AKI versus UTI.  UA consistent with UTI will give IV Rocephin.  Ammonia 17, lipase 30, lactic 0.6 Coags within normal limits Blood cultures pending Procedures  MDM  Patient presents emergency department with 1 week of diarrhea and  altered mental status beginning this morning.  Her work-up has been significant for elevated creatinine and BUN with hyperglycemia without electrolyte derangements.  Her CT abdomen pelvis showed a large amount of urine in her bladder however she has urinated since being here and concern for incomplete emptying of her bladder.  Ordered Foley catheter placement.  AKI may be multifactorial given fluid loss from diarrhea.  She has blood cultures pending.  She has GI stool cultures ordered to be collected however she has not had any diarrhea here in the emergency department.  I spoke with Dr. Pietro Cassis who agrees to admit the patient at this time.  Because she was inpatient status at behavioral health the patient had to be discharged out of the system and a new encounter had to be created.  The patient has a bed available on 6E room 1617 per [facility] inpatient placement.  Dr. Hal Hope will be the admitting evening shift and is aware the patient is being admitted upstairs.   Pietro Cassis, MD           Mickie Hillier, PA-C 11/23/20 Doran Heater    Carmin Muskrat, MD 11/23/20 [phone]

## 2020-11-23 NOTE — Progress Notes (Signed)
Dar Note: Patient presents with a flat affect and depressed mood.  Appetite poor.  Patient was febrile this morning with temperature of 102 degree.  Tylenol 650 mg given. Repeat temperature result of 99.1 degree obtained.   Per report: Patient had complained about abdominal discomfort and diarrhea yesterday. Patient transferred to [facility] per MD order for medical evaluation.  Report given to charge nurse.

## 2020-11-23 NOTE — Progress Notes (Signed)
Pt did not attend group due to prep for ER

## 2020-11-23 NOTE — Plan of Care (Cosign Needed Addendum)
[facility] MD Plan of Care Note  Patient was seen, assessed, and discussed with attending, Dr. Nelda Marseille.   Patient was febrile to 102 deg, per RN this AM. Patient was given Tylenol 650 mg. Repeat vitals, temp was unable to be detected. Resp panel completed, and patient COVID neg. On assessment, she was confused and minimally responsive, often with blank stare. Repeat POC glucose was 97.   Patient will be transferred to [facility] for workup, report given to Dr. Tamera Punt that patient had been afebrile prior to today and only had mild abdominal discomfort and diarrhea (with 1 episode of fecal incontinence/ day over the past 2 days) as somatic sx. She had otherwise been subjectively afebrile, non-diaphoretic, no chills.   Rosezetta Schlatter, MD PGY-1 11/23/2020 [facility] Department of Psychiatry

## 2020-11-24 ENCOUNTER — Inpatient Hospital Stay (HOSPITAL_COMMUNITY)
Admission: AD | Admit: 2020-11-24 | Discharge: 2020-11-26 | DRG: 690 | Disposition: A | Discharge status: Home / Self Care | Payer: Medicaid Other | Source: Ambulatory Visit | Attending: Internal Medicine | Admitting: Internal Medicine

## 2020-11-24 ENCOUNTER — Encounter (HOSPITAL_COMMUNITY): Payer: Self-pay | Admitting: Internal Medicine

## 2020-11-24 DIAGNOSIS — Z880 Allergy status to penicillin: Secondary | ICD-10-CM

## 2020-11-24 DIAGNOSIS — Z83438 Family history of other disorder of lipoprotein metabolism and other lipidemia: Secondary | ICD-10-CM

## 2020-11-24 DIAGNOSIS — Z833 Family history of diabetes mellitus: Secondary | ICD-10-CM

## 2020-11-24 DIAGNOSIS — E1122 Type 2 diabetes mellitus with diabetic chronic kidney disease: Secondary | ICD-10-CM | POA: Diagnosis present

## 2020-11-24 DIAGNOSIS — F4325 Adjustment disorder with mixed disturbance of emotions and conduct: Secondary | ICD-10-CM

## 2020-11-24 DIAGNOSIS — I129 Hypertensive chronic kidney disease with stage 1 through stage 4 chronic kidney disease, or unspecified chronic kidney disease: Secondary | ICD-10-CM

## 2020-11-24 DIAGNOSIS — E538 Deficiency of other specified B group vitamins: Secondary | ICD-10-CM | POA: Diagnosis present

## 2020-11-24 DIAGNOSIS — F32A Depression, unspecified: Secondary | ICD-10-CM | POA: Diagnosis present

## 2020-11-24 DIAGNOSIS — F251 Schizoaffective disorder, depressive type: Secondary | ICD-10-CM | POA: Diagnosis present

## 2020-11-24 DIAGNOSIS — E785 Hyperlipidemia, unspecified: Secondary | ICD-10-CM | POA: Diagnosis present

## 2020-11-24 DIAGNOSIS — E1140 Type 2 diabetes mellitus with diabetic neuropathy, unspecified: Secondary | ICD-10-CM | POA: Diagnosis present

## 2020-11-24 DIAGNOSIS — Z8249 Family history of ischemic heart disease and other diseases of the circulatory system: Secondary | ICD-10-CM

## 2020-11-24 DIAGNOSIS — Z794 Long term (current) use of insulin: Secondary | ICD-10-CM

## 2020-11-24 DIAGNOSIS — I10 Essential (primary) hypertension: Secondary | ICD-10-CM | POA: Diagnosis present

## 2020-11-24 DIAGNOSIS — Z20822 Contact with and (suspected) exposure to covid-19: Secondary | ICD-10-CM | POA: Diagnosis present

## 2020-11-24 DIAGNOSIS — Z634 Disappearance and death of family member: Secondary | ICD-10-CM

## 2020-11-24 DIAGNOSIS — Z841 Family history of disorders of kidney and ureter: Secondary | ICD-10-CM

## 2020-11-24 DIAGNOSIS — N179 Acute kidney failure, unspecified: Secondary | ICD-10-CM | POA: Diagnosis present

## 2020-11-24 DIAGNOSIS — E1165 Type 2 diabetes mellitus with hyperglycemia: Secondary | ICD-10-CM | POA: Diagnosis present

## 2020-11-24 DIAGNOSIS — Z888 Allergy status to other drugs, medicaments and biological substances status: Secondary | ICD-10-CM

## 2020-11-24 DIAGNOSIS — G4710 Hypersomnia, unspecified: Secondary | ICD-10-CM | POA: Diagnosis present

## 2020-11-24 DIAGNOSIS — R45851 Suicidal ideations: Secondary | ICD-10-CM | POA: Diagnosis present

## 2020-11-24 DIAGNOSIS — D631 Anemia in chronic kidney disease: Secondary | ICD-10-CM | POA: Diagnosis present

## 2020-11-24 DIAGNOSIS — N184 Chronic kidney disease, stage 4 (severe): Secondary | ICD-10-CM | POA: Diagnosis present

## 2020-11-24 DIAGNOSIS — R197 Diarrhea, unspecified: Secondary | ICD-10-CM | POA: Diagnosis present

## 2020-11-24 DIAGNOSIS — F419 Anxiety disorder, unspecified: Secondary | ICD-10-CM | POA: Diagnosis present

## 2020-11-24 DIAGNOSIS — Z79899 Other long term (current) drug therapy: Secondary | ICD-10-CM

## 2020-11-24 DIAGNOSIS — Z7984 Long term (current) use of oral hypoglycemic drugs: Secondary | ICD-10-CM

## 2020-11-24 DIAGNOSIS — Z5900 Homelessness unspecified: Secondary | ICD-10-CM

## 2020-11-24 DIAGNOSIS — Z87891 Personal history of nicotine dependence: Secondary | ICD-10-CM

## 2020-11-24 DIAGNOSIS — N390 Urinary tract infection, site not specified: Principal | ICD-10-CM | POA: Diagnosis present

## 2020-11-24 LAB — RETICULOCYTES
Immature Retic Fract: 9.7 % (ref 2.3–15.9)
RBC.: 2.41 MIL/uL — ABNORMAL LOW (ref 3.87–5.11)
Retic Count, Absolute: 30.1 10*3/uL (ref 19.0–186.0)
Retic Ct Pct: 1.3 % (ref 0.4–3.1)

## 2020-11-24 LAB — CBC WITH DIFFERENTIAL/PLATELET
Abs Immature Granulocytes: 0.03 10*3/uL (ref 0.00–0.07)
Basophils Absolute: 0 10*3/uL (ref 0.0–0.1)
Basophils Relative: 0 %
Eosinophils Absolute: 0.2 10*3/uL (ref 0.0–0.5)
Eosinophils Relative: 3 %
HCT: 24 % — ABNORMAL LOW (ref 36.0–46.0)
Hemoglobin: 7.7 g/dL — ABNORMAL LOW (ref 12.0–15.0)
Immature Granulocytes: 0 %
Lymphocytes Relative: 14 %
Lymphs Abs: 1.1 10*3/uL (ref 0.7–4.0)
MCH: 30 pg (ref 26.0–34.0)
MCHC: 32.1 g/dL (ref 30.0–36.0)
MCV: 93.4 fL (ref 80.0–100.0)
Monocytes Absolute: 0.9 10*3/uL (ref 0.1–1.0)
Monocytes Relative: 11 %
Neutro Abs: 5.7 10*3/uL (ref 1.7–7.7)
Neutrophils Relative %: 72 %
Platelets: 193 10*3/uL (ref 150–400)
RBC: 2.57 MIL/uL — ABNORMAL LOW (ref 3.87–5.11)
RDW: 13.2 % (ref 11.5–15.5)
WBC: 8 10*3/uL (ref 4.0–10.5)
nRBC: 0 % (ref 0.0–0.2)

## 2020-11-24 LAB — FOLATE: Folate: 12.5 ng/mL (ref >5.9)

## 2020-11-24 LAB — CK TOTAL AND CKMB (NOT AT ARMC)
CK, MB: 2.8 ng/mL (ref 0.5–5.0)
Relative Index: 1.2 (ref 0.0–2.5)
Total CK: 239 U/L — ABNORMAL HIGH (ref 38–234)

## 2020-11-24 LAB — GLUCOSE, CAPILLARY
Glucose-Capillary: 98 mg/dL (ref 70–99)
Glucose-Capillary: 139 mg/dL — ABNORMAL HIGH (ref 70–99)
Glucose-Capillary: 127 mg/dL — ABNORMAL HIGH (ref 70–99)
Glucose-Capillary: 181 mg/dL — ABNORMAL HIGH (ref 70–99)

## 2020-11-24 LAB — CBC
HCT: 22.4 % — ABNORMAL LOW (ref 36.0–46.0)
HCT: 22.7 % — ABNORMAL LOW (ref 36.0–46.0)
Hemoglobin: 7.2 g/dL — ABNORMAL LOW (ref 12.0–15.0)
Hemoglobin: 7.5 g/dL — ABNORMAL LOW (ref 12.0–15.0)
MCH: 30.1 pg (ref 26.0–34.0)
MCH: 29.9 pg (ref 26.0–34.0)
MCHC: 32.1 g/dL (ref 30.0–36.0)
MCHC: 33 g/dL (ref 30.0–36.0)
MCV: 93.7 fL (ref 80.0–100.0)
MCV: 90.4 fL (ref 80.0–100.0)
Platelets: 189 10*3/uL (ref 150–400)
Platelets: 119 10*3/uL — ABNORMAL LOW (ref 150–400)
RBC: 2.39 MIL/uL — ABNORMAL LOW (ref 3.87–5.11)
RBC: 2.51 MIL/uL — ABNORMAL LOW (ref 3.87–5.11)
RDW: 13.3 % (ref 11.5–15.5)
RDW: 13.2 % (ref 11.5–15.5)
WBC: 9.2 10*3/uL (ref 4.0–10.5)
WBC: 8.8 10*3/uL (ref 4.0–10.5)
nRBC: 0 % (ref 0.0–0.2)
nRBC: 0 % (ref 0.0–0.2)

## 2020-11-24 LAB — HEPATIC FUNCTION PANEL
ALT: 18 U/L (ref 0–44)
AST: 20 U/L (ref 15–41)
Albumin: 3.3 g/dL — ABNORMAL LOW (ref 3.5–5.0)
Alkaline Phosphatase: 58 U/L (ref 38–126)
Bilirubin, Direct: 0.1 mg/dL (ref 0.0–0.2)
Indirect Bilirubin: 0.8 mg/dL (ref 0.3–0.9)
Total Bilirubin: 0.9 mg/dL (ref 0.3–1.2)
Total Protein: 6.5 g/dL (ref 6.5–8.1)

## 2020-11-24 LAB — IRON AND TIBC
Iron: 23 ug/dL — ABNORMAL LOW (ref 28–170)
Saturation Ratios: 9 % — ABNORMAL LOW (ref 10.4–31.8)
TIBC: 260 ug/dL (ref 250–450)
UIBC: 237 ug/dL

## 2020-11-24 LAB — CREATININE, SERUM
Creatinine, Ser: 4.33 mg/dL — ABNORMAL HIGH (ref 0.44–1.00)
GFR, Estimated: 11 mL/min — ABNORMAL LOW (ref >60)

## 2020-11-24 LAB — BASIC METABOLIC PANEL
Anion gap: 9 (ref 5–15)
BUN: 70 mg/dL — ABNORMAL HIGH (ref 6–20)
CO2: 19 mmol/L — ABNORMAL LOW (ref 22–32)
Calcium: 8.3 mg/dL — ABNORMAL LOW (ref 8.9–10.3)
Chloride: 108 mmol/L (ref 98–111)
Creatinine, Ser: 4.22 mg/dL — ABNORMAL HIGH (ref 0.44–1.00)
GFR, Estimated: 12 mL/min — ABNORMAL LOW (ref >60)
Glucose, Bld: 88 mg/dL (ref 70–99)
Potassium: 4.8 mmol/L (ref 3.5–5.1)
Sodium: 136 mmol/L (ref 135–145)

## 2020-11-24 LAB — FERRITIN: Ferritin: 65 ng/mL (ref 11–307)

## 2020-11-24 LAB — VITAMIN B12: Vitamin B-12: 199 pg/mL (ref 180–914)

## 2020-11-24 LAB — CK: Total CK: 236 U/L — ABNORMAL HIGH (ref 38–234)

## 2020-11-24 LAB — LACTIC ACID, PLASMA: Lactic Acid, Venous: 0.7 mmol/L (ref 0.5–1.9)

## 2020-11-24 MED ORDER — CHLORHEXIDINE GLUCONATE CLOTH 2 % EX PADS
6.0000 | MEDICATED_PAD | Freq: Every day | CUTANEOUS | Status: DC
  Administered 2020-11-24: 6 via TOPICAL

## 2020-11-24 MED ORDER — SODIUM CHLORIDE 0.9 % IV SOLN
1.0000 g | INTRAVENOUS | Status: DC
  Administered 2020-11-24 – 2020-11-25 (×2): 1 g via INTRAVENOUS
  Filled 2020-11-24 (×3): qty 10

## 2020-11-24 MED ORDER — AMLODIPINE BESYLATE 10 MG PO TABS
10.0000 mg | ORAL_TABLET | Freq: Every day | ORAL | Status: DC
  Administered 2020-11-24 – 2020-11-26 (×2): 10 mg via ORAL
  Filled 2020-11-24 (×3): qty 1

## 2020-11-24 MED ORDER — CARVEDILOL 3.125 MG PO TABS
3.1250 mg | ORAL_TABLET | Freq: Two times a day (BID) | ORAL | Status: DC
  Administered 2020-11-25 – 2020-11-26 (×4): 3.125 mg via ORAL
  Filled 2020-11-24 (×4): qty 1

## 2020-11-24 MED ORDER — INSULIN ASPART 100 UNIT/ML IJ SOLN
0.0000 [IU] | Freq: Three times a day (TID) | INTRAMUSCULAR | Status: DC
Start: 2020-11-24 — End: 2020-11-27
  Administered 2020-11-24 – 2020-11-25 (×3): 1 [IU] via SUBCUTANEOUS
  Administered 2020-11-25: 2 [IU] via SUBCUTANEOUS
  Administered 2020-11-25 – 2020-11-26 (×2): 1 [IU] via SUBCUTANEOUS

## 2020-11-24 MED ORDER — CYANOCOBALAMIN 1000 MCG/ML IJ SOLN
1000.0000 ug | Freq: Once | INTRAMUSCULAR | Status: AC
  Administered 2020-11-24: 1000 ug via INTRAMUSCULAR
  Filled 2020-11-24: qty 1

## 2020-11-24 MED ORDER — HEPARIN SODIUM (PORCINE) 5000 UNIT/ML IJ SOLN: 5000.0000 [IU] | Freq: Three times a day (TID) | INTRAMUSCULAR | Status: DC

## 2020-11-24 MED ORDER — HYDRALAZINE HCL 25 MG PO TABS
25.0000 mg | ORAL_TABLET | Freq: Three times a day (TID) | ORAL | Status: DC
Start: 2020-11-24 — End: 2020-11-24

## 2020-11-24 MED ORDER — HEPARIN SODIUM (PORCINE) 5000 UNIT/ML IJ SOLN
5000.0000 [IU] | Freq: Three times a day (TID) | INTRAMUSCULAR | Status: DC
  Administered 2020-11-24 – 2020-11-26 (×7): 5000 [IU] via SUBCUTANEOUS
  Filled 2020-11-24 (×7): qty 1

## 2020-11-24 MED ORDER — INSULIN ASPART 100 UNIT/ML IJ SOLN: 0.0000 [IU] | Freq: Three times a day (TID) | INTRAMUSCULAR | Status: DC

## 2020-11-24 MED ORDER — HYDRALAZINE HCL 20 MG/ML IJ SOLN: 10.0000 mg | Freq: Four times a day (QID) | INTRAMUSCULAR | Status: DC | PRN

## 2020-11-24 MED ORDER — BISACODYL 5 MG PO TBEC: 5.0000 mg | DELAYED_RELEASE_TABLET | Freq: Every day | ORAL | Status: DC | PRN

## 2020-11-24 MED ORDER — CHLORHEXIDINE GLUCONATE CLOTH 2 % EX PADS
6.0000 | MEDICATED_PAD | Freq: Every day | CUTANEOUS | Status: DC
  Administered 2020-11-24 – 2020-11-25 (×2): 6 via TOPICAL

## 2020-11-24 MED ORDER — INSULIN ASPART 100 UNIT/ML IJ SOLN: 0.0000 [IU] | Freq: Every day | INTRAMUSCULAR | Status: DC

## 2020-11-24 MED ORDER — ALBUTEROL SULFATE (2.5 MG/3ML) 0.083% IN NEBU: 2.5000 mg | INHALATION_SOLUTION | Freq: Four times a day (QID) | RESPIRATORY_TRACT | Status: DC

## 2020-11-24 MED ORDER — ALBUTEROL SULFATE (2.5 MG/3ML) 0.083% IN NEBU: 2.5000 mg | INHALATION_SOLUTION | Freq: Four times a day (QID) | RESPIRATORY_TRACT | Status: DC | PRN

## 2020-11-24 MED ORDER — RISPERIDONE 1 MG PO TABS
1.0000 mg | ORAL_TABLET | Freq: Every day | ORAL | Status: DC
  Administered 2020-11-24 – 2020-11-25 (×2): 1 mg via ORAL
  Filled 2020-11-24 (×2): qty 1

## 2020-11-24 MED ORDER — SODIUM CHLORIDE 0.9 % IV SOLN: INTRAVENOUS | Status: AC

## 2020-11-24 MED ORDER — SENNA 8.6 MG PO TABS
1.0000 | ORAL_TABLET | Freq: Two times a day (BID) | ORAL | Status: DC
  Administered 2020-11-24: 8.6 mg via ORAL
  Filled 2020-11-24: qty 1

## 2020-11-24 NOTE — Consult Note (Signed)
[facility] Psychiatry Consult   Reason for Consult:   Referring Physician:   Patient Identification: Lorraine Turner MRN:  [id number] Principal Diagnosis: <principal problem not specified> Diagnosis:  Active Problems:   Primary hypertension   Schizoaffective disorder, depressive type ([place])   Type 2 diabetes mellitus with hyperglycemia ()   Acute worsening of stage 4 chronic kidney disease ([place])   UTI (urinary tract infection)   ARF (acute renal failure) ([place])   Total Time spent with patient: 45 minutes  Subjective:   Lorraine Turner is a 60 y.o. female patient admitted with urinary tract infection.  Patient presented as a transfer from [facility], where she was being treated since October 8 for worsening depression and suicidal ideations.  Patient has a history of schizoaffective disorder, depression in which she was being managed with Risperdal 1 mg p.o. nightly, buspirone 5 mg p.o. twice daily, gabapentin 100 mg p.o. 3 times daily, sertraline 50 mg p.o. daily for anxiety and depression.  Patient reports compliance with the above medication, up until recent medical admission in which her Risperdal was held.   On today's evaluation patient is alert and oriented, very pleasant, jovial interaction and appears to be doing much better than upon admission.  She is observed to be sitting upright eating her breakfast, and interacting well with her safety sitter at bedside.  She is able to correctly identify all items on her breakfast tray, identify what is contributing to her improved physical condition, and looking forward to ongoing recovery efforts.  Patient denies any depressive symptoms today, anxiety, mania, paranoia, psychosis.  She does not appear to be responding to internal stimuli, external stimuli, and or exhibiting delusional thought disorder.  She furthermore denies any suicidal ideations, homicidal ideations, and/or auditory or visual hallucinations.  She  is very engaging, answers all questions appropriately, and has a linear conversation with provider.  We did briefly discuss her concerns regarding homelessness, and she reports seeking all housing options at this time, and appears hopeful about finding placement.  She does state if housing options are not available, she would like to be assisted with transportation to the nearest [employer].  Support offered, reassurance, and encouragement were offered in order for patient to continue to seek appropriate housing options and availability.  Patient does not abuse any substances, and will not benefit from [place].  She no longer endorses any thoughts of suicidal ideations, and does not appear to be engaging in any self-harm, she furthermore she is able to contract for safety while on the unit.  HPI:  Lorraine Turner is a 60 y.o. female with history of schizoaffective disorder with suicidal ideation was admitted to behavioral health and was transferred to the [facility] ER after patient was found to be having a fever 51 F and patient was appearing confused.  Prior to that admission patient had had diarrhea for almost a week.  Denies any abdominal pain nausea or vomiting. Prior to this medical admission patient was recently being treated at [facility] for suicidal ideations and severe depression.  Patient originally reported feeling hopeless and helpless since her husband died.  Patient further reported that she lost her home to foreclosure, and has since been homeless, living here and there sometimes in the shelter.  Patient was scheduled to be discharged home on Wednesday, October 12, however reported concerns of homelessness.  She was held an additional day, in which she developed acute illness as a result she was  transferred to [facility] for the above work-up.   Past Psychiatric History: Schizoaffective  D/O, Depression, Anxiety, suicide attempts  Risk to Self:  Denies Risk to Others: Denies Prior Inpatient Therapy: [facility] October 2022, September 30, 2020, July 2022, July 2020 Prior Outpatient Therapy: Receiving services from [place], through [facility] last visit October 25, 2020  Past Medical History:  Past Medical History:  Diagnosis Date   Anxiety    Bipolar 1 disorder ([place])    Depression    Diabetes mellitus without complication ([place])    Gallstones    Hyperlipidemia    Hypertension    MDD (major depressive disorder), recurrent episode, moderate ([place]) 11/18/2020   Neuropathy    Schizophrenia ([place])    Seizures ([place])    X1- febrile seizure as a child-none since   Suicide ideation 09/12/2018    Past Surgical History:  Procedure Laterality Date   CESAREAN SECTION  05/1998   X 1   CHOLECYSTECTOMY N/A 03/27/2016   Procedure: LAPAROSCOPIC CHOLECYSTECTOMY;  Surgeon: Olean Ree, MD;  Location: [facility] ORS;  Service: General;  Laterality: N/A;   TONSILLECTOMY AND ADENOIDECTOMY     age 41   TUBAL LIGATION     Family History:  Family History  Problem Relation Age of Onset   Diabetes Mother    Heart disease Mother    Kidney disease Mother    Stroke Mother    Hyperlipidemia Mother    Diabetes Maternal Aunt    Family Psychiatric  History: Denies Social History:  Social History   Substance and Sexual Activity  Alcohol Use Yes   Alcohol/week: 1.0 standard drink   Types: 1 Cans of beer per week   Comment: One can of beer every 2-3 months.      Social History   Substance and Sexual Activity  Drug Use No    Social History   Socioeconomic History   Marital status: Married    Spouse name: Not on file   Number of children: Not on file   Years of education: Not on file   Highest education level: Not on file  Occupational History   Not on file  Tobacco Use   Smoking status: Former    Types: Cigarettes    Quit date: 03/14/2013    Years since  quitting: 7.7   Smokeless tobacco: Never   Tobacco comments:    1 cigarette1-2 times year, only when she gets stressed  Vaping Use   Vaping Use: Never used  Substance and Sexual Activity   Alcohol use: Yes    Alcohol/week: 1.0 standard drink    Types: 1 Cans of beer per week    Comment: One can of beer every 2-3 months.    Drug use: No   Sexual activity: Not Currently  Other Topics Concern   Not on file  Social History Narrative   ** Merged History Encounter **       Social Determinants of Health   Financial Resource Strain: Not on file  Food Insecurity: Not on file  Transportation Needs: Not on file  Physical Activity: Not on file  Stress: Not on file  Social Connections: Not on file   Additional Social History:    Allergies:   Allergies  Allergen Reactions   Lisinopril Other (See Comments)    Patient does not wish to take this medication (she heard that it can make your kidneys worse)   Amoxicillin  Rash    Labs:  Results for orders placed or performed during the hospital encounter of 11/24/20 (from the past 48 hour(s))  Basic metabolic panel     Status: Abnormal   Collection Time: 11/24/20  1:38 AM  Result Value Ref Range   Sodium 136 135 - 145 mmol/L   Potassium 4.8 3.5 - 5.1 mmol/L   Chloride 108 98 - 111 mmol/L   CO2 19 (L) 22 - 32 mmol/L   Glucose, Bld 88 70 - 99 mg/dL    Comment: Glucose reference range applies only to samples taken after fasting for at least 8 hours.   BUN 70 (H) 6 - 20 mg/dL   Creatinine, Ser 4.22 (H) 0.44 - 1.00 mg/dL   Calcium 8.3 (L) 8.9 - 10.3 mg/dL   GFR, Estimated 12 (L) >60 mL/min    Comment: (NOTE) Calculated using the CKD-EPI Creatinine Equation (2021)    Anion gap 9 5 - 15    Comment: Performed at [facility], [place] [address] [place], [place] [address]  CBC with Differential/Platelet     Status: Abnormal   Collection Time: 11/24/20  1:38 AM  Result Value Ref Range   WBC 8.0 4.0 - 10.5 K/uL   RBC  2.57 (L) 3.87 - 5.11 MIL/uL   Hemoglobin 7.7 (L) 12.0 - 15.0 g/dL   HCT 24.0 (L) 36.0 - 46.0 %   MCV 93.4 80.0 - 100.0 fL   MCH 30.0 26.0 - 34.0 pg   MCHC 32.1 30.0 - 36.0 g/dL   RDW 13.2 11.5 - 15.5 %   Platelets 193 150 - 400 K/uL   nRBC 0.0 0.0 - 0.2 %   Neutrophils Relative % 72 %   Neutro Abs 5.7 1.7 - 7.7 K/uL   Lymphocytes Relative 14 %   Lymphs Abs 1.1 0.7 - 4.0 K/uL   Monocytes Relative 11 %   Monocytes Absolute 0.9 0.1 - 1.0 K/uL   Eosinophils Relative 3 %   Eosinophils Absolute 0.2 0.0 - 0.5 K/uL   Basophils Relative 0 %   Basophils Absolute 0.0 0.0 - 0.1 K/uL   Immature Granulocytes 0 %   Abs Immature Granulocytes 0.03 0.00 - 0.07 K/uL    Comment: Performed at [facility], [place] [address], [place], [place] [address]  Hepatic function panel     Status: Abnormal   Collection Time: 11/24/20  1:38 AM  Result Value Ref Range   Total Protein 6.5 6.5 - 8.1 g/dL   Albumin 3.3 (L) 3.5 - 5.0 g/dL   AST 20 15 - 41 U/L   ALT 18 0 - 44 U/L   Alkaline Phosphatase 58 38 - 126 U/L   Total Bilirubin 0.9 0.3 - 1.2 mg/dL   Bilirubin, Direct 0.1 0.0 - 0.2 mg/dL   Indirect Bilirubin 0.8 0.3 - 0.9 mg/dL    Comment: Performed at [facility], [place] [address], [place], [place] [address]  CK     Status: Abnormal   Collection Time: 11/24/20  1:38 AM  Result Value Ref Range   Total CK 236 (H) 38 - 234 U/L    Comment: Performed at [facility], [place] [address] [place],  [address]  CBC     Status: Abnormal   Collection Time: 11/24/20  5:59 AM  Result Value Ref Range   WBC 9.2 4.0 - 10.5 K/uL   RBC 2.39 (L) 3.87 - 5.11 MIL/uL   Hemoglobin 7.2 (L) 12.0 -  15.0 g/dL   HCT 22.4 (L) 36.0 - 46.0 %   MCV 93.7 80.0 - 100.0 fL   MCH 30.1 26.0 - 34.0 pg   MCHC 32.1 30.0 - 36.0 g/dL   RDW 13.3 11.5 - 15.5 %   Platelets 189 150 - 400 K/uL   nRBC 0.0 0.0 - 0.2 %    Comment: Performed at [facility], [place]  [address], [place], [place] [address]  Creatinine, serum     Status: Abnormal   Collection Time: 11/24/20  5:59 AM  Result Value Ref Range   Creatinine, Ser 4.33 (H) 0.44 - 1.00 mg/dL   GFR, Estimated 11 (L) >60 mL/min    Comment: (NOTE) Calculated using the CKD-EPI Creatinine Equation (2021) Performed at [facility], [place] [address], [place], [place] [address]   Vitamin B12     Status: None   Collection Time: 11/24/20  5:59 AM  Result Value Ref Range   Vitamin B-12 199 180 - 914 pg/mL    Comment: (NOTE) This assay is not validated for testing neonatal or myeloproliferative syndrome specimens for Vitamin B12 levels. Performed at [facility], [place] [address] [place], [place] [address]   Folate     Status: None   Collection Time: 11/24/20  5:59 AM  Result Value Ref Range   Folate 12.5 >5.9 ng/mL    Comment: Performed at [facility], [place] [address], [place], [place] [address]  Iron and TIBC     Status: Abnormal   Collection Time: 11/24/20  5:59 AM  Result Value Ref Range   Iron 23 (L) 28 - 170 ug/dL   TIBC 260 250 - 450 ug/dL   Saturation Ratios 9 (L) 10.4 - 31.8 %   UIBC 237 ug/dL    Comment: Performed at [facility], [place] [address], [place], [place] [address]  Ferritin     Status: None   Collection Time: 11/24/20  5:59 AM  Result Value Ref Range   Ferritin 65 11 - 307 ng/mL    Comment: Performed at [facility], [place] [address], [place], [place] [address]  Reticulocytes     Status: Abnormal   Collection Time: 11/24/20  5:59 AM  Result Value Ref Range   Retic Ct Pct 1.3 0.4 - 3.1 %   RBC. 2.41 (L) 3.87 - 5.11 MIL/uL   Retic Count, Absolute 30.1 19.0 - 186.0 K/uL   Immature Retic Fract 9.7 2.3 - 15.9 %    Comment: Performed at [facility], [place] [address] [place], [place] [address]  Glucose, capillary     Status: None   Collection Time: 11/24/20  8:12 AM   Result Value Ref Range   Glucose-Capillary 98 70 - 99 mg/dL    Comment: Glucose reference range applies only to samples taken after fasting for at least 8 hours.    Current Facility-Administered Medications  Medication Dose Route Frequency Provider Last Rate Last Admin   0.9 %  sodium chloride infusion   Intravenous Continuous Rise Patience, MD 75 mL/hr at 11/24/20 0612 Infusion Verify at 11/24/20 0612   albuterol (PROVENTIL) (2.5 MG/3ML) 0.083% nebulizer solution 2.5 mg  2.5 mg Nebulization Q6H PRN Rise Patience, MD       bisacodyl (DULCOLAX) EC tablet 5 mg  5 mg Oral Daily PRN Rise Patience, MD       cefTRIAXone (ROCEPHIN) 1 g in sodium chloride 0.9 % 100 mL  IVPB  1 g Intravenous Q24H Rise Patience, MD       heparin injection 5,000 Units  5,000 Units Subcutaneous Q8H Rise Patience, MD   5,000 Units at 11/24/20 0606   insulin aspart (novoLOG) injection 0-9 Units  0-9 Units Subcutaneous TID WC Rise Patience, MD        Musculoskeletal: Strength & Muscle Tone: within normal limits Gait & Station: normal Patient leans: N/A            Psychiatric Specialty Exam:  Presentation  General Appearance: Appropriate for Environment; Casual  Eye Contact:Fair  Speech:Clear and Coherent; Normal Rate  Speech Volume:Normal  Handedness:Right   Mood and Affect  Mood:Euthymic  Affect:Appropriate; Congruent   Thought Process  Thought Processes:Coherent; Linear  Descriptions of Associations:Intact  Orientation:Full (Time, Place and Person)  Thought Content:Logical  History of Schizophrenia/Schizoaffective disorder:No  Duration of Psychotic Symptoms:No data recorded Hallucinations:Hallucinations: None  Ideas of Reference:None  Suicidal Thoughts:Suicidal Thoughts: No  Homicidal Thoughts:Homicidal Thoughts: No   Sensorium  Memory:Immediate Fair; Recent Fair; Remote Fair  Judgment:Fair  Insight:Fair   Executive Functions   Concentration:Fair  Attention Span:Fair  [place]   Psychomotor Activity  Psychomotor Activity:Psychomotor Activity: Normal   Assets  Assets:Leisure Time; Armed forces logistics/support/administrative officer; Physical Health; Housing; Social Support; Desire for Improvement   Sleep  Sleep:Sleep: Fair Number of Hours of Sleep: 0 (Not documented)   Physical Exam: Physical Exam ROS Blood pressure (!) 141/60, pulse 65, temperature 98.5 F (36.9 C), temperature source Oral, resp. rate 20, SpO2 94 %. There is no height or weight on file to calculate BMI.  Treatment Plan Summary: Plan Will restart risperidone at 1 mg p.o. nightly.  Continue other medications as listed above. -Patient appears to be improving due to IV hydration, current antibiotics, recommend continue with current medications per her medical team. -Patient is no longer a danger to herself Will recommend discontinuation of safety sitter. -Patient does identify as homeless, recommend working closely with TOC to identify safety options and disposition.  Patient was provided additional resources for housing prior to transfer from [place] referral for [facility] For medication management and ongoing therapy.  Disposition: No evidence of imminent risk to self or others at present.   Patient does not meet criteria for psychiatric inpatient admission. Supportive therapy provided about ongoing stressors. Refer to IOP. Discussed crisis plan, support from social network, calling 911, coming to the Emergency Department, and calling Suicide Hotline.  Suella Broad, FNP 11/24/2020 10:39 AM

## 2020-11-24 NOTE — Discharge Summary (Signed)
Physician Discharge Summary Note  Patient:  Lorraine Turner is an 60 y.o., female MRN:  [id number] DOB:  Nov 04, 1960 Patient phone:  [phone] (home)  Patient address:   [place] [address],  Total Time spent with patient: I personally spent 35 minutes on the unit in direct patient care. The direct patient care time included face-to-face time with the patient, reviewing the patient's chart, communicating with other professionals, and coordinating care. Greater than 50% of this time was spent in counseling or coordinating care with the patient regarding goals of hospitalization, psycho-education, and discharge planning needs.   Date of Admission:  11/18/2020 Date of Discharge: 11/23/2020  Reason for Admission:  Per H&P: Records reviewed, report received and plan was discussed with members of our interdisciplinary team. Patient is an [place] female who was admitted early this morning from [facility] ER for suicide ideation and severe depression.  Patient has a hx of Depression, Suicide attempts and Schizoaffective disorder.  Patient reported that she has been feeling hopeless and helpless since her husband died.  Patient reported that that she lost her home to foreclosure and since then has been homeless, living here and there and some times in the shelter.  Patient was taken to [facility] ER from the street by Ambulance.  She is not suicidal today but depressed and helpless.  She rated her depression and anxiety 6/10 with 10 being severe depression.  She reported poor energy, hypersomnia and poor motivation.  Patient admitted to three previous inpatient hospitalization to [facility] 1985, 2012 AND 2015).  Patient also admitted to 6-7 suicide attempts and last was this past July when she walked into traffic hoping to be killed by a car.  Her plan this time was to step into traffic but was brought to the ER on time.  Patient sees a Mental health  Provider at [place].  Patient denied hx of substance or Alcohol use/Abuse.  She is a mother to 5 children and her son that was helping her is now incarcerated for a crime, her fours daughters are not in position to help her.  Patient reported her stressors are homelessness and financial difficulties.  She appears unkempt and disheveled and tired this morning. We have resumed her home medications and will make changes as needed.  We will round on her daily and SW/CM will be involved in her care and look into her social needs.  Patient denied SI/HI/AVH this morning and no paranoia.  Patient slept 1.5 hors last night due to coming to the unit very late.  She has been sleeping this morning.  Labs: HCG negative.  Ha1c elevated back in July-Pt is diabetic.  Creatinine elevated-hx Kidney disease.  UDS-Negative  Principal Problem: <principal problem not specified> Discharge Diagnoses: Active Problems:   Primary hypertension   Schizoaffective disorder, depressive type ([place])   Type 2 diabetes mellitus with hyperglycemia (HCC)   Acute worsening of stage 4 chronic kidney disease ([place])   UTI (urinary tract infection)   ARF (acute renal failure) (HCC)   Past Psychiatric History: See H&P  Past Medical History:  Past Medical History:  Diagnosis Date   Anxiety    Bipolar 1 disorder ([place])    Depression    Diabetes mellitus without complication ([place])    Gallstones    Hyperlipidemia    Hypertension    MDD (major depressive disorder), recurrent episode, moderate ([place]) 11/18/2020   Neuropathy    Schizophrenia (  [place])    Seizures ([place])    X1- febrile seizure as a child-none since   Suicide ideation 09/12/2018    Past Surgical History:  Procedure Laterality Date   CESAREAN SECTION  05/1998   X 1   CHOLECYSTECTOMY N/A 03/27/2016   Procedure: LAPAROSCOPIC CHOLECYSTECTOMY;  Surgeon: Olean Ree, MD;  Location: [facility] ORS;  Service: General;  Laterality: N/A;   TONSILLECTOMY AND  ADENOIDECTOMY     age 27   TUBAL LIGATION     Family History:  Family History  Problem Relation Age of Onset   Diabetes Mother    Heart disease Mother    Kidney disease Mother    Stroke Mother    Hyperlipidemia Mother    Diabetes Maternal Aunt    Family Psychiatric  History: See H&P Social History:  Social History   Substance and Sexual Activity  Alcohol Use Yes   Alcohol/week: 1.0 standard drink   Types: 1 Cans of beer per week   Comment: One can of beer every 2-3 months.      Social History   Substance and Sexual Activity  Drug Use No    Social History   Socioeconomic History   Marital status: Married    Spouse name: Not on file   Number of children: Not on file   Years of education: Not on file   Highest education level: Not on file  Occupational History   Not on file  Tobacco Use   Smoking status: Former    Types: Cigarettes    Quit date: 03/14/2013    Years since quitting: 7.7   Smokeless tobacco: Never   Tobacco comments:    1 cigarette1-2 times year, only when she gets stressed  Vaping Use   Vaping Use: Never used  Substance and Sexual Activity   Alcohol use: Yes    Alcohol/week: 1.0 standard drink    Types: 1 Cans of beer per week    Comment: One can of beer every 2-3 months.    Drug use: No   Sexual activity: Not Currently  Other Topics Concern   Not on file  Social History Narrative   ** Merged History Encounter **       Social Determinants of Health   Financial Resource Strain: Not on file  Food Insecurity: Not on file  Transportation Needs: Not on file  Physical Activity: Not on file  Stress: Not on file  Social Connections: Not on file    Hospital Course:  After the above admission evaluation, Vanya's presenting symptoms were noted. She was recommended for mood stabilization treatments. The medication regimen targeting those presenting symptoms were discussed with her & continued with her consent. Prior to admission meds that were  continued: Buspirone 5 mg po bid for anxiety, Gabapentin 100 mg tid for pain/mood, Sertraline 50 mg po daily for anxiety and depression, and Quetiapine 100 mg po at bed time for mood/Mental health. After having soft BP, on 10/10, Seroquel was discontinued, and Risperdal 1 mg qHS was initiated for mood stabilization.  Her UDS on arrival to the ED was, negative, BAL negative. She was medicated, stabilized & discharged to [facility] on the medications as listed on her admission medication lists below. Besides the mood stabilization treatments, Kolbie was also enrolled & participated in the group counseling sessions being offered & held on this unit. She learned coping skills. She presented with the pre-existing medical issues of HTN and T2DM that required treatment with Amlodipine 5 mg po daily, Coreg  tablet 3.125 mg po bid, Hydralazine 25 mg po tid (all of which were decreased due to patient having soft BP), and Insulin Glargine-yfgn 15 units plus Novolog sliding scale insulin. On 10/11, Jenille's CBG was 57, and she became diaphoretic and confused. She was given 8 oz juice and peanut butter crackers, and on repeat CBG was 81, and she returned to normal mentation. After this incident, her Glargine was decreased to 13 units. On morning assessment on 10/13, Satsuki did not appear well. She was febrile to 102, and difficult to arouse. When asked questions, she would sometimes respond slowly with garbled speech, other times, she would have a blank stare on her face. COVID- 19 Resp panel was neg. Because of the unknown etiology of diarrhea and fever, she was transferred to [facility] for further workup.    During the course of her hospitalization, the 15-minute checks were adequate to ensure patient's safety. Torianna did not display any dangerous, violent or suicidal behavior on the unit.  She interacted with patients & staff appropriately, participated appropriately in the group sessions/therapies. Her medications were addressed &  adjusted to meet her needs. She is to be recommended for outpatient follow-up care & medication management upon discharge to assure continuity of care & mood stability.  At the time of discharge patient is not reporting any acute suicidal/homicidal ideations. She feels more confident about her self-care & in managing her mental health, but less confident about where she will be staying, per conversation on 10/12. Education and supportive counseling provided throughout her hospital stay.   Today upon her discharge evaluation with the attending psychiatrist, Asalee does not appear to be doing well medically. She endorses diarrhea but denies any other specific concerns.     Physical Findings:  Musculoskeletal: Strength & Muscle Tone:  UTA Gait & Station: unable to stand at the time of assessment Patient leans: N/A   Psychiatric Specialty Exam:  Presentation  General Appearance: Appropriate for Environment; Casual  Eye Contact:Minimal  Speech:Slow, garbled  Speech Volume:Decreased  Handedness:Right   Mood and Affect  Mood:Unable to assess  Affect:Difficult to arouse   Thought Process  Thought Processes:UTA  Descriptions of Associations:Intact  Orientation:Disoriented  Thought Content:UTA  History of Schizophrenia/Schizoaffective disorder:Yes  Duration of Psychotic Symptoms:Greater than 6 months Hallucinations:Hallucinations: None  Ideas of Reference:UTA  Suicidal Thoughts:UTA  Homicidal Thoughts:UTA   Sensorium  Memory:UTA  Judgment:UTA  Insight:UTA   Executive Functions  Concentration:UTA  Attention Span:UTA  [place]   Psychomotor Activity  Psychomotor Activity:Retardation   Assets  Assets:Leisure Time; Armed forces logistics/support/administrative officer; Physical Health; Housing; Social Support; Desire for Improvement   Sleep  Sleep:Sleep: Fair Number of Hours of Sleep: 0 (Not documented)    Physical Exam: Physical  Exam Vitals and nursing note reviewed.  Constitutional:      General: She is in acute distress.     Appearance: She is ill-appearing.     Comments: Minimally responsive to questions; confused with blank stare  HENT:     Head: Normocephalic and atraumatic.  Abdominal:     General: Abdomen is flat.     Palpations: Abdomen is soft.  Skin:    General: Skin is warm and dry.  Neurological:     Mental Status: She is disoriented.   Review of Systems  Unable to perform ROS: Mental status change  Blood pressure (!) 156/59, pulse 67, temperature 98.6 F (37 C), temperature source Oral, resp. rate 14, SpO2 98 %. There is no height  or weight on file to calculate BMI.   Social History   Tobacco Use  Smoking Status Former   Types: Cigarettes   Quit date: 03/14/2013   Years since quitting: 7.7  Smokeless Tobacco Never  Tobacco Comments   1 cigarette1-2 times year, only when she gets stressed   Tobacco Cessation:  Prescription not provided because: Patient transferred to the ED and admitted to medical floor.   Blood Alcohol level:  Lab Results  Component Value Date   ETH <10 11/16/2020   ETH <10 [id number]    Metabolic Disorder Labs:  Lab Results  Component Value Date   HGBA1C 8.3 (H) 11/19/2020   MPG 191.51 11/19/2020   MPG 189 09/01/2020   No results found for: PROLACTIN Lab Results  Component Value Date   CHOL 197 11/18/2020   TRIG 144 11/18/2020   HDL 49 11/18/2020   CHOLHDL 4.0 11/18/2020   VLDL 29 11/18/2020   LDLCALC 119 (H) 11/18/2020   LDLCALC 158 (H) 01/13/2019    See Psychiatric Specialty Exam and Suicide Risk Assessment completed by Attending Physician prior to discharge.  Discharge destination:  Other:  [facility]  Is patient on multiple antipsychotic therapies at discharge:  No   Has Patient had three or more failed trials of antipsychotic monotherapy by history:  No  Recommended Plan for Multiple Antipsychotic Therapies: NA  Current  Medications- [facility]:          Current Facility-Administered Medications  Medication Dose Route Frequency Provider Last Rate Last Admin   0.9 %  sodium chloride infusion   Intravenous Continuous Dahal, Marlowe Aschoff, MD       acetaminophen (TYLENOL) tablet 650 mg  650 mg Oral Q6H PRN Bobbitt, Shalon E, NP   650 mg at 11/23/20 0739   alum & mag hydroxide-simeth (MAALOX/MYLANTA) 200-200-20 MG/5ML suspension 30 mL  30 mL Oral Q4H PRN Bobbitt, Shalon E, NP       amLODipine (NORVASC) tablet 5 mg  5 mg Oral Daily Onuoha, Josephine C, NP   5 mg at 11/23/20 1014   busPIRone (BUSPAR) tablet 5 mg  5 mg Oral BID Bobbitt, Shalon E, NP   5 mg at 11/23/20 0741   carvedilol (COREG) tablet 3.125 mg  3.125 mg Oral BID WC Bobbitt, Shalon E, NP   3.125 mg at 11/23/20 1014   dextrose 50 % solution 25 g  25 g Intravenous Once Autry, Lauren E, PA-C       feeding supplement (GLUCERNA SHAKE) (GLUCERNA SHAKE) liquid 237 mL  237 mL Oral BID BM Singleton, Amy E, MD   237 mL at 11/23/20 1208   gabapentin (NEURONTIN) capsule 100 mg  100 mg Oral TID Bobbitt, Shalon E, NP   100 mg at 11/23/20 1207   hydrALAZINE (APRESOLINE) tablet 25 mg  25 mg Oral TID Bobbitt, Shalon E, NP   25 mg at 11/23/20 1207   hydrOXYzine (ATARAX/VISTARIL) tablet 25 mg  25 mg Oral TID PRN Lavella Hammock, MD   25 mg at 11/22/20 1549   insulin aspart (novoLOG) injection 0-15 Units  0-15 Units Subcutaneous TID WC Briant Cedar, MD   3 Units at 11/21/20 1709   insulin aspart (novoLOG) injection 0-5 Units  0-5 Units Subcutaneous QHS Briant Cedar, MD   2 Units at 11/18/20 2219   lactase (LACTAID) tablet 6,000 Units  6,000 Units Oral TID WC Rosezetta Schlatter, MD   6,000 Units at 11/23/20 1207   loperamide (IMODIUM) capsule 2 mg  2 mg Oral PRN Rosezetta Schlatter, MD   2 mg at 11/22/20 1315   magnesium hydroxide (MILK OF MAGNESIA) suspension 30 mL  30 mL Oral Daily PRN Bobbitt, Shalon E, NP   30 mL at 11/18/20 1839   melatonin tablet 5 mg  5 mg Oral QHS Hill,  Jackie Plum, MD   5 mg at 11/22/20 2100   multivitamin with minerals tablet 1 tablet  1 tablet Oral Daily Harlow Asa, MD   1 tablet at 11/23/20 0739   risperiDONE (RISPERDAL) tablet 1 mg  1 mg Oral QHS Hill, Jackie Plum, MD   1 mg at 11/22/20 2100          Follow-up recommendations:  Other:  To be followed by consulting psychiatric team while at [facility].  Comments:  Transfer to [facility] for work-up of diarrhea and fever.  Signed: Rosezetta Schlatter, MD 11/24/2020, 10:01 PM

## 2020-11-24 NOTE — Progress Notes (Signed)
Patient admitted to the hospital earlier this morning by Dr. Hal Hope  Patient seen and examined.  She reports that she has not been having any further loose stools.  She has not had a bowel movement since being admitted to her room this morning.  She denies any recent hematochezia or melena.  She was having frequent stools.  She reportedly had a fever while at behavioral health, but has been afebrile since arrival to the hospital.  Assessment/plan:  AKI on CKD stage IV. -Baseline creatinine appears between 3-3.5 -Admission creatinine noted to be 4.6 -Suspect this is prerenal related to persistent diarrhea -Continue on IV fluids -There was some concern that she may have some incomplete bladder emptying due to large bladder on imaging.  Review of CT scan does not show any significant bladder distention.  She denies any previous history of urinary retention.  We will discontinue Foley at this time and monitor  Possible urinary tract infection -Started on ceftriaxone -Follow-up urine culture  Anemia -Suspect is related to her chronic kidney disease -She also has a component of B12 deficiency which has been replaced -She denies any recent blood loss, FOBT has been ordered -Repeat CBC in a.m. and transfuse for hemoglobin less than 7  Hypertension -Chronically on Coreg and amlodipine -These will be resumed  Suicidal ideation with depression and schizoaffective disorder -Seen by psychiatry today no longer felt to be a threat to herself.  Sitter has been discontinued and she is not felt to be appropriate for inpatient psychiatric admission. Will need [facility] -[facility] was seeing her when she was at behavioral health and did provide additional resources for housing prior to being admitted to [facility] long -We will have TOC follow here as well  [employer]

## 2020-11-24 NOTE — Progress Notes (Signed)
  [facility] Adult Case Management Discharge Plan :  Will you be returning to the same living situation after discharge:  No. Emergency department  At discharge, do you have transportation home?: Yes,  Ambulance  Do you have the ability to pay for your medications: Yes,  Medicaid   Release of information consent forms completed and in the chart;  Patient's signature needed at discharge.  Patient to Follow up at:  Follow-up Information     [place]. Go on 12/06/2020.   Why: You an appointment with your primary care provider for medication management services on 12/06/20 at 9:30 am. This appointment will be held in person. Contact information: [place] [phone] [place]. Go to.   Specialty: Behavioral Health Why: Please go to this provider for therapy services during walk in hours:  Monday through Wednesday, from 7:45 am to 11:00 am.  Services will be provided on a first come, first served basis.  Please arrive early. Contact information: [place] [phone]                Next level of care provider has access to [place] and Suicide Prevention discussed: Yes,  with patient      Has patient been referred to the Quitline?: Patient refused referral  Patient has been referred for addiction treatment: N/A  Darleen Crocker, [place] 11/24/2020, 9:53 AM

## 2020-11-24 NOTE — H&P (Signed)
History and Physical    Lorraine Turner EKC:[id number] DOB: [birth year] DOA: 11/24/2020  PCP: [name], MD  Patient coming from: Patient was transferred from behavioral health.  Chief Complaint: Fever.  HPI: Lorraine Turner is a 60 y.o. female with history of schizoaffective disorder with suicidal ideation was admitted to behavioral health and was transferred to the [facility] ER after patient was found to be having a fever 66 F and patient was appearing confused.  Prior to that admission patient had had diarrhea for almost a week.  Denies any abdominal pain nausea or vomiting.  ED Course: Patient is labs show worsening creatinine which at baseline is around 3.2 which was around 4.6 and also patient's hemoglobin has further worsened from baseline around 10 is around 7.7.  UA is concerning for UTI.  COVID test was negative.  Chest x-ray did show some trace right pleural effusion.  Patient was placed on ceftriaxone for UTI and admitted for further management of acute on chronic kidney disease stage IV and fever likely from UTI.  Review of Systems: As per HPI, rest all negative.   Past Medical History:  Diagnosis Date   Anxiety    Bipolar 1 disorder ([place])    Depression    Diabetes mellitus without complication ([place])    Gallstones    Hyperlipidemia    Hypertension    MDD (major depressive disorder), recurrent episode, moderate ([place]) 11/18/2020   Neuropathy    Schizophrenia ([place])    Seizures ([place])    X1- febrile seizure as a child-none since   Suicide ideation 09/12/2018    Past Surgical History:  Procedure Laterality Date   CESAREAN SECTION  05/1998   X 1   CHOLECYSTECTOMY N/A 03/27/2016   Procedure: LAPAROSCOPIC CHOLECYSTECTOMY;  Surgeon: Olean Ree, MD;  Location: [facility] ORS;  Service: General;  Laterality: N/A;   TONSILLECTOMY AND ADENOIDECTOMY     age 53   TUBAL LIGATION       reports that she quit smoking about 7 years ago. Her smoking use included cigarettes. She  has never used smokeless tobacco. She reports current alcohol use of about 1.0 standard drink per week. She reports that she does not use drugs.  Allergies  Allergen Reactions   Lisinopril Other (See Comments)    Patient does not wish to take this medication (she heard that it can make your kidneys worse)   Amoxicillin Rash    Family History  Problem Relation Age of Onset   Diabetes Mother    Heart disease Mother    Kidney disease Mother    Stroke Mother    Hyperlipidemia Mother    Diabetes Maternal Aunt     Prior to Admission medications   Medication Sig Start Date End Date Taking? Authorizing Provider  amLODipine (NORVASC) 10 MG tablet Take 1 tablet (10 mg total) by mouth daily. Patient not taking: Reported on 11/17/2020 09/06/20   Shawna Clamp, MD  Blood Glucose Monitoring Suppl (TRUE METRIX METER) w/Device KIT 1 Device by Does not apply route 3 (three) times daily. For blood sugar checks 09/13/18   Barb Merino, MD  busPIRone (BUSPAR) 5 MG tablet Take 1 tablet (5 mg total) by mouth 2 (two) times daily. Patient not taking: Reported on 11/17/2020 09/05/20   Shawna Clamp, MD  carvedilol (COREG) 3.125 MG tablet Take 1 tablet (3.125 mg total) by mouth 2 (two) times daily with a meal. Patient not taking: Reported on 11/17/2020 09/05/20   Shawna Clamp, MD  gabapentin ([place])  100 MG capsule Take 1 capsule (100 mg total) by mouth 3 (three) times daily. Patient not taking: Reported on 11/17/2020 09/05/20 12/04/20  Shawna Clamp, MD  glipiZIDE (GLUCOTROL) 10 MG tablet Take 1 tablet (10 mg total) by mouth 2 (two) times daily before a meal. Patient not taking: Reported on 11/17/2020 09/05/20 10/05/20  Shawna Clamp, MD  glucose blood test strip Use as instructed 10/28/18   Argentina Donovan, PA-C  hydrALAZINE (APRESOLINE) 25 MG tablet Take 1 tablet (25 mg total) by mouth 3 (three) times daily. Patient not taking: No sig reported 09/05/20   Shawna Clamp, MD  insulin glargine (LANTUS) 100  UNIT/ML injection Inject 0.15 mLs (15 Units total) into the skin at bedtime. Patient not taking: Reported on 11/17/2020 09/05/20   Shawna Clamp, MD  Insulin Syringe-Needle U-100 (SAFETY INSULIN SYRINGES) 30G X 5/16" 0.5 ML MISC 1 each by Does not apply route at bedtime. 10/21/19   Argentina Donovan, PA-C  QUEtiapine (SEROQUEL) 100 MG tablet Take 1 tablet (100 mg total) by mouth at bedtime. For mood control Patient not taking: No sig reported 09/05/20   Shawna Clamp, MD  sertraline (ZOLOFT) 50 MG tablet Take 1 tablet (50 mg total) by mouth daily. Patient not taking: Reported on 11/17/2020 09/06/20   Shawna Clamp, MD  TRUEplus Lancets 28G MISC 1 each by Does not apply route 3 (three) times daily. For blood sugar checks 09/13/18   Barb Merino, MD    Physical Exam: Constitutional: Moderately built and nourished. Vitals:   11/24/20 0028  BP: (!) 149/59  Pulse: 64  Resp: 18  Temp: 98.3 F (36.8 C)  TempSrc: Oral  SpO2: 97%   Eyes: Anicteric no pallor. ENMT: No discharge from the ears eyes nose and mouth. Neck: No mass felt.  No neck rigidity. Respiratory: No rhonchi or crepitations. Cardiovascular: S1-S2 heard. Abdomen: Soft nontender bowel sound present. Musculoskeletal: No edema. Skin: No rash. Neurologic: Alert awake oriented to time place and person.  Moving all extremities. Psychiatric: Patient is suicidal.   Labs on Admission: I have personally reviewed following labs and imaging studies  CBC: Recent Labs  Lab 11/23/20 1441 11/24/20 0138  WBC 8.3 8.0  NEUTROABS 5.6 5.7  HGB 8.0* 7.7*  HCT 25.4* 24.0*  MCV 93.7 93.4  PLT 218 109   Basic Metabolic Panel: Recent Labs  Lab 11/23/20 1441 11/24/20 0138  NA 137 136  K 4.7 4.8  CL 106 108  CO2 25 19*  GLUCOSE 63* 88  BUN 68* 70*  CREATININE 4.67* 4.22*  CALCIUM 8.8* 8.3*   GFR: Estimated Creatinine Clearance: 12.4 mL/min (A) (by C-G formula based on SCr of 4.22 mg/dL (H)). Liver Function Tests: Recent Labs   Lab 11/23/20 1441 11/24/20 0138  AST 20 20  ALT 19 18  ALKPHOS 65 58  BILITOT 0.9 0.9  PROT 7.1 6.5  ALBUMIN 3.4* 3.3*   Recent Labs  Lab 11/23/20 1441  LIPASE 30   Recent Labs  Lab 11/23/20 1441  AMMONIA 17   Coagulation Profile: Recent Labs  Lab 11/23/20 1441  INR 1.0   Cardiac Enzymes: Recent Labs  Lab 11/24/20 0138  CKTOTAL 239*  236*  CKMB 2.8   BNP (last 3 results) No results for input(s): PROBNP in the last 8760 hours. HbA1C: No results for input(s): HGBA1C in the last 72 hours. CBG: Recent Labs  Lab 11/23/20 0758 11/23/20 0948 11/23/20 1608 11/23/20 2031 11/23/20 2215  GLUCAP 81 97 67* 128* 105*   Lipid  Profile: No results for input(s): CHOL, HDL, LDLCALC, TRIG, CHOLHDL, LDLDIRECT in the last 72 hours. Thyroid Function Tests: No results for input(s): TSH, T4TOTAL, FREET4, T3FREE, THYROIDAB in the last 72 hours. Anemia Panel: No results for input(s): VITAMINB12, FOLATE, FERRITIN, TIBC, IRON, RETICCTPCT in the last 72 hours. Urine analysis:    Component Value Date/Time   COLORURINE YELLOW 11/23/2020 1602   APPEARANCEUR CLOUDY (A) 11/23/2020 1602   LABSPEC 1.012 11/23/2020 1602   PHURINE 8.0 11/23/2020 1602   GLUCOSEU NEGATIVE 11/23/2020 1602   HGBUR NEGATIVE 11/23/2020 1602   HGBUR negative 10/12/2008 1103   BILIRUBINUR NEGATIVE 11/23/2020 1602   BILIRUBINUR negative 10/21/2019 1447   BILIRUBINUR negative 03/10/2017 1459   KETONESUR NEGATIVE 11/23/2020 1602   PROTEINUR 100 (A) 11/23/2020 1602   UROBILINOGEN 0.2 10/21/2019 1447   UROBILINOGEN 0.2 09/30/2019 1151   NITRITE NEGATIVE 11/23/2020 1602   LEUKOCYTESUR LARGE (A) 11/23/2020 1602   Sepsis Labs: '@LABRCNTIP' (procalcitonin:4,lacticidven:4) ) Recent Results (from the past 240 hour(s))  Resp Panel by RT-PCR (Flu A&B, Covid) Nasopharyngeal Swab     Status: None   Collection Time: 11/16/20 12:22 PM   Specimen: Nasopharyngeal Swab; Nasopharyngeal(NP) swabs in vial transport medium   Result Value Ref Range Status   SARS Coronavirus 2 by RT PCR NEGATIVE NEGATIVE Final    Comment: (NOTE) SARS-CoV-2 target nucleic acids are NOT DETECTED.  The SARS-CoV-2 RNA is generally detectable in upper respiratory specimens during the acute phase of infection. The lowest concentration of SARS-CoV-2 viral copies this assay can detect is 138 copies/mL. A negative result does not preclude SARS-Cov-2 infection and should not be used as the sole basis for treatment or other patient management decisions. A negative result may occur with  improper specimen collection/handling, submission of specimen other than nasopharyngeal swab, presence of viral mutation(s) within the areas targeted by this assay, and inadequate number of viral copies(<138 copies/mL). A negative result must be combined with clinical observations, patient history, and epidemiological information. The expected result is Negative.  Fact Sheet for Patients:  EntrepreneurPulse.com.au  Fact Sheet for Healthcare Providers:  IncredibleEmployment.be  This test is no t yet approved or cleared by the [place] FDA and  has been authorized for detection and/or diagnosis of SARS-CoV-2 by FDA under an Emergency Use Authorization (EUA). This EUA will remain  in effect (meaning this test can be used) for the duration of the COVID-19 declaration under Section 564(b)(1) of the Act, 21 U.S.C.section 360bbb-3(b)(1), unless the authorization is terminated  or revoked sooner.       Influenza A by PCR NEGATIVE NEGATIVE Final   Influenza B by PCR NEGATIVE NEGATIVE Final    Comment: (NOTE) The Xpert Xpress SARS-CoV-2/FLU/RSV plus assay is intended as an aid in the diagnosis of influenza from Nasopharyngeal swab specimens and should not be used as a sole basis for treatment. Nasal washings and aspirates are unacceptable for Xpert Xpress SARS-CoV-2/FLU/RSV testing.  Fact Sheet for  Patients: EntrepreneurPulse.com.au  Fact Sheet for Healthcare Providers: IncredibleEmployment.be  This test is not yet approved or cleared by the [place] FDA and has been authorized for detection and/or diagnosis of SARS-CoV-2 by FDA under an Emergency Use Authorization (EUA). This EUA will remain in effect (meaning this test can be used) for the duration of the COVID-19 declaration under Section 564(b)(1) of the Act, 21 U.S.C. section 360bbb-3(b)(1), unless the authorization is terminated or revoked.  Performed at [facility], [place] [address], [place], [place] [address]   Resp Panel by  RT-PCR (Flu A&B, Covid) Nasopharyngeal Swab     Status: None   Collection Time: 11/23/20  7:55 AM   Specimen: Nasopharyngeal Swab; Nasopharyngeal(NP) swabs in vial transport medium  Result Value Ref Range Status   SARS Coronavirus 2 by RT PCR NEGATIVE NEGATIVE Final    Comment: (NOTE) SARS-CoV-2 target nucleic acids are NOT DETECTED.  The SARS-CoV-2 RNA is generally detectable in upper respiratory specimens during the acute phase of infection. The lowest concentration of SARS-CoV-2 viral copies this assay can detect is 138 copies/mL. A negative result does not preclude SARS-Cov-2 infection and should not be used as the sole basis for treatment or other patient management decisions. A negative result may occur with  improper specimen collection/handling, submission of specimen other than nasopharyngeal swab, presence of viral mutation(s) within the areas targeted by this assay, and inadequate number of viral copies(<138 copies/mL). A negative result must be combined with clinical observations, patient history, and epidemiological information. The expected result is Negative.  Fact Sheet for Patients:  EntrepreneurPulse.com.au  Fact Sheet for Healthcare Providers:  IncredibleEmployment.be  This test is no t yet  approved or cleared by the [place] FDA and  has been authorized for detection and/or diagnosis of SARS-CoV-2 by FDA under an Emergency Use Authorization (EUA). This EUA will remain  in effect (meaning this test can be used) for the duration of the COVID-19 declaration under Section 564(b)(1) of the Act, 21 U.S.C.section 360bbb-3(b)(1), unless the authorization is terminated  or revoked sooner.       Influenza A by PCR NEGATIVE NEGATIVE Final   Influenza B by PCR NEGATIVE NEGATIVE Final    Comment: (NOTE) The Xpert Xpress SARS-CoV-2/FLU/RSV plus assay is intended as an aid in the diagnosis of influenza from Nasopharyngeal swab specimens and should not be used as a sole basis for treatment. Nasal washings and aspirates are unacceptable for Xpert Xpress SARS-CoV-2/FLU/RSV testing.  Fact Sheet for Patients: EntrepreneurPulse.com.au  Fact Sheet for Healthcare Providers: IncredibleEmployment.be  This test is not yet approved or cleared by the [place] FDA and has been authorized for detection and/or diagnosis of SARS-CoV-2 by FDA under an Emergency Use Authorization (EUA). This EUA will remain in effect (meaning this test can be used) for the duration of the COVID-19 declaration under Section 564(b)(1) of the Act, 21 U.S.C. section 360bbb-3(b)(1), unless the authorization is terminated or revoked.  Performed at [facility], [place] [address] [place], [place] [address]      Radiological Exams on Admission: CT ABDOMEN PELVIS WO CONTRAST  Result Date: 11/23/2020 CLINICAL DATA:  Lethargy, weakness, diarrhea, abdominal pain, fever EXAM: CT ABDOMEN AND PELVIS WITHOUT CONTRAST TECHNIQUE: Multidetector CT imaging of the abdomen and pelvis was performed following the standard protocol without IV contrast. COMPARISON:  09/17/2017 FINDINGS: Lower chest: There is a trace right pleural effusion. No acute airspace disease.  Hepatobiliary: No focal liver abnormality is seen. Status post cholecystectomy. No biliary dilatation. Pancreas: There is fat replacement of the pancreatic parenchyma. No acute inflammatory change. Spleen: Normal in size without focal abnormality. Adrenals/Urinary Tract: No urinary tract calculi or obstructive uropathy. Gas within the bladder lumen may reflect recent catheterization. The adrenals are unremarkable. Stomach/Bowel: No bowel obstruction or ileus. No bowel wall thickening or inflammatory change. Vascular/Lymphatic: Aortic atherosclerosis. No enlarged abdominal or pelvic lymph nodes. Reproductive: Uterus and bilateral adnexa are unremarkable. Other: No free fluid or free gas. Small fat containing umbilical hernia. Musculoskeletal: No acute or destructive bony lesions. Reconstructed images demonstrate no additional  findings. IMPRESSION: 1. No urinary tract calculi or obstructive uropathy. 2. Gas within the bladder lumen, please correlate with any recent catheterization. 3. Trace right pleural effusion. 4.  Aortic Atherosclerosis (ICD10-I70.0). Electronically Signed   By: Randa Ngo M.D.   On: 11/23/2020 15:41   DG Chest 2 View  Result Date: 11/23/2020 CLINICAL DATA:  Questionable sepsis. EXAM: CHEST - 2 VIEW COMPARISON:  Chest x-ray 11/16/2020, CT chest 09/20/2014 FINDINGS: The heart and mediastinal contours are unchanged. No focal consolidation. No pulmonary edema. Trace right pleural effusion. No pneumothorax. No acute osseous abnormality. IMPRESSION: Interval development of a trace right pleural effusion. Electronically Signed   By: Iven Finn M.D.   On: 11/23/2020 15:27      Assessment/Plan Active Problems:   Primary hypertension   Schizoaffective disorder, depressive type ([place])   Type 2 diabetes mellitus with hyperglycemia ([facility])   Acute worsening of stage 4 chronic kidney disease ([facility])   UTI (urinary tract infection)   ARF (acute renal failure) ([facility])    Acute on chronic  disease stage IV likely worsened from recent diarrhea poor oral intake for which patient is receiving gentle hydration.  Follow metabolic panel. Fever and confusion could be from UTI.  Patient is on ceftriaxone.  Follow cultures. Worsening anemia we will check anemia panel stool for occult blood check serial CBCs. Diabetes mellitus type 2 we will keep patient on sliding scale coverage. Hypertension for like a patient hydralazine Home medicine needs to be verified.  Patient states he takes 3 antihypertensives.  Blood pressures in the low normal at this time. Suicidal ideation with depression and schizoaffective disorder per psychiatry.  Patient is on suicide precautions with sitter.   DVT prophylaxis: Heparin. Code Status: Full code. Family Communication: Discussed with patient. Disposition Plan: To be determined. Consults called: Psychiatry. Admission status: Observation.   Rise Patience MD [facility] Pager [phone].  If 7PM-7AM, please contact night-coverage [email or web address] Password [email or web address]  11/24/2020, 3:08 AM

## 2020-11-25 DIAGNOSIS — E1122 Type 2 diabetes mellitus with diabetic chronic kidney disease: Secondary | ICD-10-CM | POA: Diagnosis present

## 2020-11-25 DIAGNOSIS — Z880 Allergy status to penicillin: Secondary | ICD-10-CM | POA: Diagnosis not present

## 2020-11-25 DIAGNOSIS — Z83438 Family history of other disorder of lipoprotein metabolism and other lipidemia: Secondary | ICD-10-CM | POA: Diagnosis not present

## 2020-11-25 DIAGNOSIS — D631 Anemia in chronic kidney disease: Secondary | ICD-10-CM | POA: Diagnosis present

## 2020-11-25 DIAGNOSIS — Z87891 Personal history of nicotine dependence: Secondary | ICD-10-CM | POA: Diagnosis not present

## 2020-11-25 DIAGNOSIS — F251 Schizoaffective disorder, depressive type: Secondary | ICD-10-CM | POA: Diagnosis present

## 2020-11-25 DIAGNOSIS — Z888 Allergy status to other drugs, medicaments and biological substances status: Secondary | ICD-10-CM | POA: Diagnosis not present

## 2020-11-25 DIAGNOSIS — R45851 Suicidal ideations: Secondary | ICD-10-CM | POA: Diagnosis present

## 2020-11-25 DIAGNOSIS — F419 Anxiety disorder, unspecified: Secondary | ICD-10-CM | POA: Diagnosis present

## 2020-11-25 DIAGNOSIS — N179 Acute kidney failure, unspecified: Secondary | ICD-10-CM | POA: Diagnosis present

## 2020-11-25 DIAGNOSIS — Z841 Family history of disorders of kidney and ureter: Secondary | ICD-10-CM | POA: Diagnosis not present

## 2020-11-25 DIAGNOSIS — I10 Essential (primary) hypertension: Secondary | ICD-10-CM | POA: Diagnosis not present

## 2020-11-25 DIAGNOSIS — N184 Chronic kidney disease, stage 4 (severe): Secondary | ICD-10-CM | POA: Diagnosis present

## 2020-11-25 DIAGNOSIS — Z20822 Contact with and (suspected) exposure to covid-19: Secondary | ICD-10-CM | POA: Diagnosis present

## 2020-11-25 DIAGNOSIS — N390 Urinary tract infection, site not specified: Secondary | ICD-10-CM | POA: Diagnosis present

## 2020-11-25 DIAGNOSIS — E1140 Type 2 diabetes mellitus with diabetic neuropathy, unspecified: Secondary | ICD-10-CM | POA: Diagnosis present

## 2020-11-25 DIAGNOSIS — G4710 Hypersomnia, unspecified: Secondary | ICD-10-CM | POA: Diagnosis present

## 2020-11-25 DIAGNOSIS — N3000 Acute cystitis without hematuria: Secondary | ICD-10-CM

## 2020-11-25 DIAGNOSIS — R197 Diarrhea, unspecified: Secondary | ICD-10-CM | POA: Diagnosis present

## 2020-11-25 DIAGNOSIS — E538 Deficiency of other specified B group vitamins: Secondary | ICD-10-CM | POA: Diagnosis present

## 2020-11-25 DIAGNOSIS — Z8249 Family history of ischemic heart disease and other diseases of the circulatory system: Secondary | ICD-10-CM | POA: Diagnosis not present

## 2020-11-25 DIAGNOSIS — E785 Hyperlipidemia, unspecified: Secondary | ICD-10-CM | POA: Diagnosis present

## 2020-11-25 DIAGNOSIS — I129 Hypertensive chronic kidney disease with stage 1 through stage 4 chronic kidney disease, or unspecified chronic kidney disease: Secondary | ICD-10-CM | POA: Diagnosis present

## 2020-11-25 DIAGNOSIS — Z833 Family history of diabetes mellitus: Secondary | ICD-10-CM | POA: Diagnosis not present

## 2020-11-25 DIAGNOSIS — E1165 Type 2 diabetes mellitus with hyperglycemia: Secondary | ICD-10-CM | POA: Diagnosis present

## 2020-11-25 DIAGNOSIS — Z5900 Homelessness unspecified: Secondary | ICD-10-CM | POA: Diagnosis not present

## 2020-11-25 LAB — GLUCOSE, CAPILLARY
Glucose-Capillary: 177 mg/dL — ABNORMAL HIGH (ref 70–99)
Glucose-Capillary: 132 mg/dL — ABNORMAL HIGH (ref 70–99)
Glucose-Capillary: 148 mg/dL — ABNORMAL HIGH (ref 70–99)
Glucose-Capillary: 157 mg/dL — ABNORMAL HIGH (ref 70–99)

## 2020-11-25 LAB — CBC
HCT: 21.4 % — ABNORMAL LOW (ref 36.0–46.0)
Hemoglobin: 6.8 g/dL — CL (ref 12.0–15.0)
MCH: 29.8 pg (ref 26.0–34.0)
MCHC: 31.8 g/dL (ref 30.0–36.0)
MCV: 93.9 fL (ref 80.0–100.0)
Platelets: 174 10*3/uL (ref 150–400)
RBC: 2.28 MIL/uL — ABNORMAL LOW (ref 3.87–5.11)
RDW: 13.2 % (ref 11.5–15.5)
WBC: 6.7 10*3/uL (ref 4.0–10.5)
nRBC: 0 % (ref 0.0–0.2)

## 2020-11-25 LAB — BASIC METABOLIC PANEL
Anion gap: 8 (ref 5–15)
BUN: 61 mg/dL — ABNORMAL HIGH (ref 6–20)
CO2: 20 mmol/L — ABNORMAL LOW (ref 22–32)
Calcium: 7.8 mg/dL — ABNORMAL LOW (ref 8.9–10.3)
Chloride: 107 mmol/L (ref 98–111)
Creatinine, Ser: 3.65 mg/dL — ABNORMAL HIGH (ref 0.44–1.00)
GFR, Estimated: 14 mL/min — ABNORMAL LOW (ref >60)
Glucose, Bld: 161 mg/dL — ABNORMAL HIGH (ref 70–99)
Potassium: 4.3 mmol/L (ref 3.5–5.1)
Sodium: 135 mmol/L (ref 135–145)

## 2020-11-25 LAB — ABO/RH: ABO/RH(D): O POS

## 2020-11-25 LAB — PREPARE RBC (CROSSMATCH)

## 2020-11-25 MED ORDER — DOXYLAMINE SUCCINATE (SLEEP) 25 MG PO TABS
25.0000 mg | ORAL_TABLET | Freq: Once | ORAL | Status: AC
  Administered 2020-11-25: 25 mg via ORAL
  Filled 2020-11-25: qty 1

## 2020-11-25 MED ORDER — SODIUM CHLORIDE 0.9% IV SOLUTION: Freq: Once | INTRAVENOUS | Status: AC

## 2020-11-25 MED ORDER — SODIUM CHLORIDE 0.9 % IV SOLN: INTRAVENOUS | Status: AC

## 2020-11-25 MED ORDER — CYANOCOBALAMIN 1000 MCG/ML IJ SOLN
1000.0000 ug | Freq: Once | INTRAMUSCULAR | Status: AC
  Administered 2020-11-25: 1000 ug via INTRAMUSCULAR
  Filled 2020-11-25: qty 1

## 2020-11-25 MED ORDER — DOXYLAMINE SUCCINATE (SLEEP) 25 MG PO TABS: 25.0000 mg | ORAL_TABLET | Freq: Once | ORAL | Status: DC

## 2020-11-25 NOTE — Progress Notes (Signed)
PROGRESS NOTE    Lorraine Turner  [id number] DOB: May 25, 1960 DOA: 11/24/2020 PCP: [name], MD    Brief Narrative:  60 year old female with a history of schizoaffective disorder, was admitted to behavioral health with suicidal ideations.  She subsequently was sent to the [facility] emergency room when she was found to have a fever of 102F and was increasingly confused.  It was reported that she had several loose stools for approximately a week.  She did not have any nausea or vomiting.  On arrival, she was found to have a possible urinary tract infection and had acute kidney injury on her chronic kidney disease.  She was admitted for further treatments.   Assessment & Plan:   Active Problems:   Primary hypertension   Schizoaffective disorder, depressive type (HCC)   Type 2 diabetes mellitus with hyperglycemia (HCC)   AKI (acute kidney injury) ([place])   Acute worsening of stage 4 chronic kidney disease ([place])   UTI (urinary tract infection)   ARF (acute renal failure) (HCC)   AKI on CKD stage IV. -Baseline creatinine appears between 3-3.5 -Admission creatinine noted to be 4.6 -Suspect this is prerenal related to persistent diarrhea -Continue on IV fluids, renal function improving -There was some concern that she may have some incomplete bladder emptying due to large bladder on imaging.  Review of CT scan does not show any significant bladder distention.  She denies any previous history of urinary retention.  Foley catheter discontinued overnight and she appears to be able to pass her urine   Possible urinary tract infection -Started on ceftriaxone -Follow-up urine culture   Anemia -Suspect is related to her chronic kidney disease -She also has a component of B12 deficiency and she has been started on replacement -She denies any recent blood loss, FOBT has been ordered -Hemoglobin down to 6.8 today, she is being transfused 1 unit of PRBC.   Hypertension -Chronically  on Coreg and amlodipine -Blood pressure currently stable   Suicidal ideation with depression and schizoaffective disorder -Seen by psychiatry today no longer felt to be a threat to herself.  Sitter has been discontinued and she is not felt to be appropriate for inpatient psychiatric admission. [place] -[facility]OC was seeing her when she was at behavioral health and did provide additional resources for housing prior to being admitted to [facility] long -We will have TOC follow here as well   DVT prophylaxis: heparin injection 5,000 Units Start: 11/24/20 0600  Code Status: Full code Family Communication: No family present Disposition Plan: Status is: Inpatient  Remains inpatient appropriate because: Patient with significant anemia needing PRBC transfusion, continued IV fluids for renal failure         Consultants:    Procedures:    Antimicrobials:  Ceftriaxone 10/14 >   Subjective: She feels well.  She has not had any further diarrhea.  Objective: Vitals:   11/25/20 1027 11/25/20 1147 11/25/20 1205 11/25/20 1401  BP:  (!) 123/55 (!) 120/56 (!) 142/62  Pulse: 60 60 60 (!) 55  Resp:  '16 15 16  '$ Temp:  99.2 F (37.3 C) 98.9 F (37.2 C) 98.3 F (36.8 C)  TempSrc:  Oral Oral Oral  SpO2: 96% 98% 97% 97%  Weight:        Intake/Output Summary (Last 24 hours) at 11/25/2020 1953 Last data filed at 11/25/2020 1839 Gross per 24 hour  Intake 1372.25 ml  Output 1000 ml  Net 372.25 ml   Filed Weights   11/25/20 0458  Weight: 69.6 kg    Examination:  General exam: Appears calm and comfortable  Respiratory system: Clear to auscultation. Respiratory effort normal. Cardiovascular system: S1 & S2 heard, RRR. No JVD, murmurs, rubs, gallops or clicks. No pedal edema. Gastrointestinal system: Abdomen is nondistended, soft and nontender. No organomegaly or masses felt.  Normal bowel sounds heard. Central nervous system: Alert and oriented. No focal neurological deficits. Extremities: Symmetric 5 x 5 power. Skin: No rashes, lesions or ulcers Psychiatry: Judgement and insight appear normal. Mood & affect appropriate.     Data Reviewed: I have personally reviewed following labs and imaging studies  CBC: Recent Labs  Lab 11/23/20 1441 11/24/20 0138 11/24/20 0559 11/24/20 1207 11/25/20 0543  WBC 8.3 8.0 9.2 8.8 6.7  NEUTROABS 5.6 5.7  --   --   --   HGB 8.0* 7.7* 7.2* 7.5* 6.8*  HCT 25.4* 24.0* 22.4* 22.7* 21.4*  MCV 93.7 93.4 93.7 90.4 93.9  PLT 218 193 189 119* [id number]   Basic Metabolic Panel: Recent Labs  Lab 11/23/20 1441 11/24/20 0138 11/24/20 0559 11/25/20 0543  NA 137 136  --  135  K 4.7 4.8  --  4.3  CL 106 108  --  107  CO2 25 19*  --  20*  GLUCOSE 63* 88  --  161*  BUN 68* 70*  --  61*  CREATININE 4.67* 4.22* 4.33* 3.65*  CALCIUM 8.8* 8.3*  --  7.8*   GFR: Estimated Creatinine Clearance: 15.2 mL/min (A) (by C-G formula based on SCr of 3.65 mg/dL (H)). Liver Function Tests: Recent Labs  Lab 11/23/20 1441 11/24/20 0138  AST 20 20  ALT 19 18  ALKPHOS 65 58  BILITOT 0.9 0.9  PROT 7.1 6.5  ALBUMIN 3.4* 3.3*   Recent Labs  Lab 11/23/20 1441  LIPASE 30   Recent Labs  Lab 11/23/20 1441  AMMONIA 17   Coagulation Profile: Recent Labs  Lab 11/23/20 1441  INR 1.0   Cardiac Enzymes: Recent Labs  Lab 11/24/20 0138  CKTOTAL 239*  236*  CKMB 2.8   BNP (last 3 results) No results for input(s): PROBNP in the last 8760 hours. HbA1C: No results for input(s): HGBA1C in the last 72 hours. CBG: Recent Labs  Lab 11/24/20 1617 11/24/20 2121 11/25/20 0759 11/25/20 1130 11/25/20 1653  GLUCAP 127* 181* 177* 132* 148*   Lipid Profile: No results for input(s): CHOL, HDL, LDLCALC, TRIG, CHOLHDL, LDLDIRECT in the last 72 hours. Thyroid Function Tests: No results for input(s): TSH, T4TOTAL, FREET4, T3FREE, THYROIDAB in  the last 72 hours. Anemia Panel: Recent Labs    11/24/20 0559  VITAMINB12 199  FOLATE 12.5  FERRITIN 65  TIBC 260  IRON 23*  RETICCTPCT 1.3   Sepsis Labs: Recent Labs  Lab 11/23/20 1441 11/24/20 0138  LATICACIDVEN 0.6 0.7    Recent Results (from the past 240 hour(s))  Resp Panel by RT-PCR (Flu A&B, Covid) Nasopharyngeal Swab     Status: None   Collection Time: 11/16/20 12:22 PM   Specimen: Nasopharyngeal Swab; Nasopharyngeal(NP) swabs in vial transport medium  Result Value Ref Range Status   SARS Coronavirus 2 by RT PCR NEGATIVE NEGATIVE Final    Comment: (NOTE) SARS-CoV-2 target nucleic acids are NOT DETECTED.  The SARS-CoV-2 RNA is generally detectable in upper respiratory specimens during the acute phase of infection. The lowest concentration of SARS-CoV-2 viral copies this assay can  detect is 138 copies/mL. A negative result does not preclude SARS-Cov-2 infection and should not be used as the sole basis for treatment or other patient management decisions. A negative result may occur with  improper specimen collection/handling, submission of specimen other than nasopharyngeal swab, presence of viral mutation(s) within the areas targeted by this assay, and inadequate number of viral copies(<138 copies/mL). A negative result must be combined with clinical observations, patient history, and epidemiological information. The expected result is Negative.  Fact Sheet for Patients:  EntrepreneurPulse.com.au  Fact Sheet for Healthcare Providers:  IncredibleEmployment.be  This test is no t yet approved or cleared by the [place] FDA and  has been authorized for detection and/or diagnosis of SARS-CoV-2 by FDA under an Emergency Use Authorization (EUA). This EUA will remain  in effect (meaning this test can be used) for the duration of the COVID-19 declaration under Section 564(b)(1) of the Act, 21 U.S.C.section 360bbb-3(b)(1),  unless the authorization is terminated  or revoked sooner.       Influenza A by PCR NEGATIVE NEGATIVE Final   Influenza B by PCR NEGATIVE NEGATIVE Final    Comment: (NOTE) The Xpert Xpress SARS-CoV-2/FLU/RSV plus assay is intended as an aid in the diagnosis of influenza from Nasopharyngeal swab specimens and should not be used as a sole basis for treatment. Nasal washings and aspirates are unacceptable for Xpert Xpress SARS-CoV-2/FLU/RSV testing.  Fact Sheet for Patients: EntrepreneurPulse.com.au  Fact Sheet for Healthcare Providers: IncredibleEmployment.be  This test is not yet approved or cleared by the [place] FDA and has been authorized for detection and/or diagnosis of SARS-CoV-2 by FDA under an Emergency Use Authorization (EUA). This EUA will remain in effect (meaning this test can be used) for the duration of the COVID-19 declaration under Section 564(b)(1) of the Act, 21 U.S.C. section 360bbb-3(b)(1), unless the authorization is terminated or revoked.  Performed at [facility], [place] [address], [place], [place] [address]   Resp Panel by RT-PCR (Flu A&B, Covid) Nasopharyngeal Swab     Status: None   Collection Time: 11/23/20  7:55 AM   Specimen: Nasopharyngeal Swab; Nasopharyngeal(NP) swabs in vial transport medium  Result Value Ref Range Status   SARS Coronavirus 2 by RT PCR NEGATIVE NEGATIVE Final    Comment: (NOTE) SARS-CoV-2 target nucleic acids are NOT DETECTED.  The SARS-CoV-2 RNA is generally detectable in upper respiratory specimens during the acute phase of infection. The lowest concentration of SARS-CoV-2 viral copies this assay can detect is 138 copies/mL. A negative result does not preclude SARS-Cov-2 infection and should not be used as the sole basis for treatment or other patient management decisions. A negative result may occur with  improper specimen collection/handling, submission of specimen  other than nasopharyngeal swab, presence of viral mutation(s) within the areas targeted by this assay, and inadequate number of viral copies(<138 copies/mL). A negative result must be combined with clinical observations, patient history, and epidemiological information. The expected result is Negative.  Fact Sheet for Patients:  EntrepreneurPulse.com.au  Fact Sheet for Healthcare Providers:  IncredibleEmployment.be  This test is no t yet approved or cleared by the [place] FDA and  has been authorized for detection and/or diagnosis of SARS-CoV-2 by FDA under an Emergency Use Authorization (EUA). This EUA will remain  in effect (meaning this test can be used) for the duration of the COVID-19 declaration under Section 564(b)(1) of the Act, 21 U.S.C.section 360bbb-3(b)(1), unless the authorization is terminated  or revoked sooner.  Influenza A by PCR NEGATIVE NEGATIVE Final   Influenza B by PCR NEGATIVE NEGATIVE Final    Comment: (NOTE) The Xpert Xpress SARS-CoV-2/FLU/RSV plus assay is intended as an aid in the diagnosis of influenza from Nasopharyngeal swab specimens and should not be used as a sole basis for treatment. Nasal washings and aspirates are unacceptable for Xpert Xpress SARS-CoV-2/FLU/RSV testing.  Fact Sheet for Patients: EntrepreneurPulse.com.au  Fact Sheet for Healthcare Providers: IncredibleEmployment.be  This test is not yet approved or cleared by the [place] FDA and has been authorized for detection and/or diagnosis of SARS-CoV-2 by FDA under an Emergency Use Authorization (EUA). This EUA will remain in effect (meaning this test can be used) for the duration of the COVID-19 declaration under Section 564(b)(1) of the Act, 21 U.S.C. section 360bbb-3(b)(1), unless the authorization is terminated or revoked.  Performed at [facility], [place] [address], [place], [place] [address]   Blood Culture (routine x 2)     Status: None (Preliminary result)   Collection Time: 11/23/20  2:39 PM   Specimen: BLOOD  Result Value Ref Range Status   Specimen Description   Final    BLOOD RIGHT ANTECUBITAL Performed at [place] [address] [place], [place] [address]    Special Requests   Final    BOTTLES DRAWN AEROBIC AND ANAEROBIC Blood Culture results may not be optimal due to an excessive volume of blood received in culture bottles Performed at [facility] [address], [place], [place] [address]    Culture   Final    NO GROWTH 2 DAYS Performed at [facility] [address], [place], [place] [address]    Report Status PENDING  Incomplete  Blood Culture (routine x 2)     Status: None (Preliminary result)   Collection Time: 11/23/20  2:41 PM   Specimen: BLOOD RIGHT HAND  Result Value Ref Range Status   Specimen Description   Final    BLOOD RIGHT HAND Performed at [facility] [address] [place], [place] [address]    Special Requests   Final    BOTTLES DRAWN AEROBIC AND ANAEROBIC Blood Culture results may not be optimal due to an excessive volume of blood received in culture bottles Performed at [facility] [address], [place], [place] [address]    Culture   Final    NO GROWTH 2 DAYS Performed at [facility] [address] [place], [place] [address]    Report Status PENDING  Incomplete  Culture, blood (routine x 2)     Status: None (Preliminary result)   Collection Time: 11/24/20  1:38 AM   Specimen: BLOOD LEFT HAND  Result Value Ref Range Status   Specimen Description   Final    BLOOD LEFT HAND Performed at [facility] [address] [place], [place] [address]    Special Requests   Final    BOTTLES DRAWN AEROBIC ONLY Blood Culture adequate volume Performed at [facility]  [address], [place], [place] [address]    Culture   Final    NO GROWTH 1 DAY Performed at [facility], [place] [address], [place], [place] [address]    Report Status PENDING  Incomplete  Culture, blood (routine x 2)     Status: None (Preliminary result)   Collection Time: 11/24/20  1:38 AM   Specimen: BLOOD RIGHT HAND  Result  Value Ref Range Status   Specimen Description   Final    BLOOD RIGHT HAND Performed at [facility] [address], [place], [place] [address]    Special Requests   Final    BOTTLES DRAWN AEROBIC ONLY Blood Culture adequate volume Performed at [facility] [address], [place], [place] [address]    Culture   Final    NO GROWTH 1 DAY Performed at [facility], [place] [address] [place],  [address]    Report Status PENDING  Incomplete         Radiology Studies: No results found.      Scheduled Meds:  amLODipine  10 mg Oral Daily   carvedilol  3.125 mg Oral BID WC   Chlorhexidine Gluconate Cloth  6 each Topical Daily   Chlorhexidine Gluconate Cloth  6 each Topical Daily   heparin  5,000 Units Subcutaneous Q8H   insulin aspart  0-9 Units Subcutaneous TID WC   risperiDONE  1 mg Oral QHS   Continuous Infusions:  sodium chloride 100 mL/hr at 11/25/20 1707   cefTRIAXone (ROCEPHIN)  IV 1 g (11/25/20 1939)     LOS: 1 day    Time spent: 68mns    JKathie Dike MD [facility]   If 7PM-7AM, please contact night-coverage [email or web address]  11/25/2020, 7:53 PM

## 2020-11-26 DIAGNOSIS — E1122 Type 2 diabetes mellitus with diabetic chronic kidney disease: Secondary | ICD-10-CM | POA: Diagnosis not present

## 2020-11-26 DIAGNOSIS — N179 Acute kidney failure, unspecified: Secondary | ICD-10-CM | POA: Diagnosis not present

## 2020-11-26 DIAGNOSIS — I129 Hypertensive chronic kidney disease with stage 1 through stage 4 chronic kidney disease, or unspecified chronic kidney disease: Secondary | ICD-10-CM

## 2020-11-26 DIAGNOSIS — N184 Chronic kidney disease, stage 4 (severe): Secondary | ICD-10-CM | POA: Diagnosis not present

## 2020-11-26 LAB — CBC
HCT: 22 % — ABNORMAL LOW (ref 36.0–46.0)
HCT: 30.6 % — ABNORMAL LOW (ref 36.0–46.0)
Hemoglobin: 7.2 g/dL — ABNORMAL LOW (ref 12.0–15.0)
Hemoglobin: 10.4 g/dL — ABNORMAL LOW (ref 12.0–15.0)
MCH: 29.5 pg (ref 26.0–34.0)
MCH: 30.7 pg (ref 26.0–34.0)
MCHC: 32.7 g/dL (ref 30.0–36.0)
MCHC: 34 g/dL (ref 30.0–36.0)
MCV: 90.2 fL (ref 80.0–100.0)
MCV: 90.3 fL (ref 80.0–100.0)
Platelets: 189 10*3/uL (ref 150–400)
Platelets: 217 10*3/uL (ref 150–400)
RBC: 2.44 MIL/uL — ABNORMAL LOW (ref 3.87–5.11)
RBC: 3.39 MIL/uL — ABNORMAL LOW (ref 3.87–5.11)
RDW: 13.5 % (ref 11.5–15.5)
RDW: 13.6 % (ref 11.5–15.5)
WBC: 6.7 10*3/uL (ref 4.0–10.5)
WBC: 6.2 10*3/uL (ref 4.0–10.5)
nRBC: 0 % (ref 0.0–0.2)
nRBC: 0 % (ref 0.0–0.2)

## 2020-11-26 LAB — GLUCOSE, CAPILLARY
Glucose-Capillary: 113 mg/dL — ABNORMAL HIGH (ref 70–99)
Glucose-Capillary: 103 mg/dL — ABNORMAL HIGH (ref 70–99)
Glucose-Capillary: 131 mg/dL — ABNORMAL HIGH (ref 70–99)

## 2020-11-26 LAB — BASIC METABOLIC PANEL
Anion gap: 8 (ref 5–15)
BUN: 51 mg/dL — ABNORMAL HIGH (ref 6–20)
CO2: 20 mmol/L — ABNORMAL LOW (ref 22–32)
Calcium: 7.9 mg/dL — ABNORMAL LOW (ref 8.9–10.3)
Chloride: 110 mmol/L (ref 98–111)
Creatinine, Ser: 3.36 mg/dL — ABNORMAL HIGH (ref 0.44–1.00)
GFR, Estimated: 15 mL/min — ABNORMAL LOW (ref >60)
Glucose, Bld: 99 mg/dL (ref 70–99)
Potassium: 4.2 mmol/L (ref 3.5–5.1)
Sodium: 138 mmol/L (ref 135–145)

## 2020-11-26 LAB — PREPARE RBC (CROSSMATCH)

## 2020-11-26 MED ORDER — QUETIAPINE FUMARATE 100 MG PO TABS: 100.0000 mg | ORAL_TABLET | Freq: Every day | ORAL | 1 refills | Status: DC

## 2020-11-26 MED ORDER — AMLODIPINE BESYLATE 10 MG PO TABS: 10.0000 mg | ORAL_TABLET | Freq: Every day | ORAL | 1 refills | Status: DC

## 2020-11-26 MED ORDER — GABAPENTIN 100 MG PO CAPS: 100.0000 mg | ORAL_CAPSULE | Freq: Three times a day (TID) | ORAL | 1 refills | Status: DC

## 2020-11-26 MED ORDER — FERROUS SULFATE 325 (65 FE) MG PO TABS: 325.0000 mg | ORAL_TABLET | Freq: Every day | ORAL | 3 refills | Status: DC

## 2020-11-26 MED ORDER — HYDRALAZINE HCL 25 MG PO TABS: 25.0000 mg | ORAL_TABLET | Freq: Three times a day (TID) | ORAL | 1 refills | Status: DC

## 2020-11-26 MED ORDER — CEPHALEXIN 500 MG PO CAPS: 500.0000 mg | ORAL_CAPSULE | Freq: Two times a day (BID) | ORAL | 0 refills | Status: AC

## 2020-11-26 MED ORDER — VITAMIN B-12 1000 MCG PO TABS: 1000.0000 ug | ORAL_TABLET | Freq: Every day | ORAL | 1 refills | Status: DC

## 2020-11-26 MED ORDER — RISPERIDONE 1 MG PO TABS: 1.0000 mg | ORAL_TABLET | Freq: Every day | ORAL | 1 refills | Status: DC

## 2020-11-26 MED ORDER — SODIUM CHLORIDE 0.9% IV SOLUTION: Freq: Once | INTRAVENOUS | Status: AC

## 2020-11-26 MED ORDER — BUSPIRONE HCL 5 MG PO TABS: 5.0000 mg | ORAL_TABLET | Freq: Two times a day (BID) | ORAL | 1 refills | Status: DC

## 2020-11-26 MED ORDER — CARVEDILOL 3.125 MG PO TABS: 3.1250 mg | ORAL_TABLET | Freq: Two times a day (BID) | ORAL | 1 refills | Status: DC

## 2020-11-26 MED ORDER — SERTRALINE HCL 50 MG PO TABS: 50.0000 mg | ORAL_TABLET | Freq: Every day | ORAL | 1 refills | Status: DC

## 2020-11-26 NOTE — Discharge Summary (Signed)
Physician Discharge Summary  Lorraine Turner:[id number] DOB: Jun 18, 1960 DOA: 11/24/2020  PCP: [name], MD  Admit date: 11/24/2020 Discharge date: 11/26/2020  Admitted From: Home Disposition: Home  Recommendations for Outpatient Follow-up:  Follow up with PCP in 1-2 weeks Please obtain BMP/CBC in one week Patient already has outpatient follow-up scheduled with behavioral health She would benefit from referral to nephrology for chronic kidney disease  Home Health: Equipment/Devices:  Discharge Condition: Stable CODE STATUS: Full code Diet recommendation: Heart healthy, carb modified  Brief/Interim Summary: 60 year old female with a history of schizoaffective disorder, was admitted to behavioral health with suicidal ideations.  She subsequently was sent to the [facility] emergency room when she was found to have a fever of 102F and was increasingly confused.  It was reported that she had several loose stools for approximately a week.  She did not have any nausea or vomiting.  On arrival, she was found to have a possible urinary tract infection and had acute kidney injury on her chronic kidney disease.  She was admitted for further treatments.  Discharge Diagnoses:  Active Problems:   Primary hypertension   Schizoaffective disorder, depressive type (HCC)   Type 2 diabetes mellitus with hyperglycemia (HCC)   AKI (acute kidney injury) ([place])   Acute worsening of stage 4 chronic kidney disease ([place])   UTI (urinary tract infection)   ARF (acute renal failure) (HCC)  AKI on CKD stage IV. -Baseline creatinine appears between 3-3.5 -Admission creatinine noted to be 4.6 -Suspect this is prerenal related to persistent diarrhea -Continue on IV fluids, renal function improving -There was some concern that she may have some incomplete bladder emptying due to large bladder on imaging.  Review of CT scan does not show any significant bladder distention.  She denies any previous  history of urinary retention.  Foley catheter discontinued overnight and she appears to be able to pass her urine -Overall renal function improved back to baseline with volume repletion   Possible urinary tract infection -Started on ceftriaxone -Culture showing E. coli at the time of discharge -She was transitioned to Keflex to complete her course   Anemia -Suspect is related to her chronic kidney disease -She also has a component of B12 deficiency and she has been started on replacement -She has not had any recent blood loss -Hemoglobin dipped to a nadir of 6.8.  She was transfused 2 units PRBC with improvement   Hypertension -Chronically on Coreg and amlodipine -Blood pressure currently stable   Suicidal ideation with depression and schizoaffective disorder -Seen by psychiatry today no longer felt to be a threat to herself.  Sitter has been discontinued and she is not felt to be appropriate for inpatient psychiatric admission. Will need outpatient follow-up with [place] -Patient identifies as homeless -Seen by [facility] and offered resources for shelter/housing   Discharge Instructions  Discharge Instructions     Diet - low sodium heart healthy   Complete by: As directed    Increase activity slowly   Complete by: As directed       Allergies as of 11/26/2020       Reactions   Lisinopril Other (See Comments)   Patient does not wish to take this medication (she heard that it can make your kidneys worse)   Amoxicillin Rash        Medication List     STOP taking these medications    glipiZIDE 10 MG tablet Commonly known as: GLUCOTROL  insulin glargine 100 UNIT/ML injection Commonly known as: Lantus       TAKE these medications    amLODipine 10 MG tablet Commonly known as: NORVASC Take 1 tablet (10 mg total) by mouth daily.   busPIRone 5 MG tablet Commonly known as: BUSPAR Take 1 tablet (5 mg total) by mouth 2  (two) times daily.   carvedilol 3.125 MG tablet Commonly known as: COREG Take 1 tablet (3.125 mg total) by mouth 2 (two) times daily with a meal.   cephALEXin 500 MG capsule Commonly known as: KEFLEX Take 1 capsule (500 mg total) by mouth 2 (two) times daily for 3 days.   ferrous sulfate 325 (65 FE) MG tablet Take 1 tablet (325 mg total) by mouth daily.   gabapentin 100 MG capsule Commonly known as: Neurontin Take 1 capsule (100 mg total) by mouth 3 (three) times daily.   glucose blood test strip Use as instructed   hydrALAZINE 25 MG tablet Commonly known as: APRESOLINE Take 1 tablet (25 mg total) by mouth 3 (three) times daily.   Insulin Syringe-Needle U-100 30G X 5/16" 0.5 ML Misc Commonly known as: Safety Insulin Syringes 1 each by Does not apply route at bedtime.   QUEtiapine 100 MG tablet Commonly known as: SEROQUEL Take 1 tablet (100 mg total) by mouth at bedtime. For mood control   risperiDONE 1 MG tablet Commonly known as: RISPERDAL Take 1 tablet (1 mg total) by mouth at bedtime.   sertraline 50 MG tablet Commonly known as: ZOLOFT Take 1 tablet (50 mg total) by mouth daily.   True Metrix Meter w/Device Kit 1 Device by Does not apply route 3 (three) times daily. For blood sugar checks   TRUEplus Lancets 28G Misc 1 each by Does not apply route 3 (three) times daily. For blood sugar checks   vitamin B-12 1000 MCG tablet Commonly known as: CYANOCOBALAMIN Take 1 tablet (1,000 mcg total) by mouth daily.        Follow-up [place] Follow up.   Why: [phone]  Call for outpatient follow up. Contact information: [place] [phone]               Allergies  Allergen Reactions   Lisinopril Other (See Comments)    Patient does not wish to take this medication (she heard that it can make your kidneys worse)   Amoxicillin Rash     Consultations:    Procedures/Studies: CT ABDOMEN PELVIS WO CONTRAST  Result Date: 11/23/2020 CLINICAL DATA:  Lethargy, weakness, diarrhea, abdominal pain, fever EXAM: CT ABDOMEN AND PELVIS WITHOUT CONTRAST TECHNIQUE: Multidetector CT imaging of the abdomen and pelvis was performed following the standard protocol without IV contrast. COMPARISON:  09/17/2017 FINDINGS: Lower chest: There is a trace right pleural effusion. No acute airspace disease. Hepatobiliary: No focal liver abnormality is seen. Status post cholecystectomy. No biliary dilatation. Pancreas: There is fat replacement of the pancreatic parenchyma. No acute inflammatory change. Spleen: Normal in size without focal abnormality. Adrenals/Urinary Tract: No urinary tract calculi or obstructive uropathy. Gas within the bladder lumen may reflect recent catheterization. The adrenals are unremarkable. Stomach/Bowel: No bowel obstruction or ileus. No bowel wall thickening or inflammatory change. Vascular/Lymphatic: Aortic atherosclerosis. No enlarged abdominal or pelvic lymph nodes. Reproductive: Uterus and bilateral adnexa are unremarkable. Other: No free fluid or free gas. Small fat containing umbilical hernia. Musculoskeletal: No acute or destructive bony lesions. Reconstructed images demonstrate no additional findings. IMPRESSION: 1.  No urinary tract calculi or obstructive uropathy. 2. Gas within the bladder lumen, please correlate with any recent catheterization. 3. Trace right pleural effusion. 4.  Aortic Atherosclerosis (ICD10-I70.0). Electronically Signed   By: Randa Ngo M.D.   On: 11/23/2020 15:41   DG Chest 2 View  Result Date: 11/23/2020 CLINICAL DATA:  Questionable sepsis. EXAM: CHEST - 2 VIEW COMPARISON:  Chest x-ray 11/16/2020, CT chest 09/20/2014 FINDINGS: The heart and mediastinal contours are unchanged. No focal consolidation. No pulmonary edema. Trace right pleural effusion. No pneumothorax. No acute osseous abnormality.  IMPRESSION: Interval development of a trace right pleural effusion. Electronically Signed   By: Iven Finn M.D.   On: 11/23/2020 15:27   DG Chest 2 View  Result Date: 11/16/2020 CLINICAL DATA:  Chest pain. EXAM: CHEST - 2 VIEW COMPARISON:  March 02, 2018 FINDINGS: The heart size and mediastinal contours are within normal limits. Both lungs are clear. Radiopaque surgical clips are seen within the right upper quadrant. Degenerative changes are seen within the thoracic spine. IMPRESSION: No active cardiopulmonary disease. Electronically Signed   By: Virgina Norfolk M.D.   On: 11/16/2020 20:16      Subjective: She is feeling better.  No longer having diarrhea.  Tolerating p.o. intake.  No new complaints.  Discharge Exam: Vitals:   11/26/20 1348 11/26/20 1403 11/26/20 1412 11/26/20 1630  BP: (!) 154/65 (!) 154/66 (!) 146/61 (!) 157/80  Pulse: 62 61  62  Resp: '16 16  18  ' Temp: 98.5 F (36.9 C) 98.9 F (37.2 C)  98.9 F (37.2 C)  TempSrc: Oral Oral  Oral  SpO2: 94% 96%  96%  Weight:        General: Pt is alert, awake, not in acute distress Cardiovascular: RRR, S1/S2 +, no rubs, no gallops Respiratory: CTA bilaterally, no wheezing, no rhonchi Abdominal: Soft, NT, ND, bowel sounds + Extremities: no edema, no cyanosis    The results of significant diagnostics from this hospitalization (including imaging, microbiology, ancillary and laboratory) are listed below for reference.     Microbiology: Recent Results (from the past 240 hour(s))  Resp Panel by RT-PCR (Flu A&B, Covid) Nasopharyngeal Swab     Status: None   Collection Time: 11/23/20  7:55 AM   Specimen: Nasopharyngeal Swab; Nasopharyngeal(NP) swabs in vial transport medium  Result Value Ref Range Status   SARS Coronavirus 2 by RT PCR NEGATIVE NEGATIVE Final    Comment: (NOTE) SARS-CoV-2 target nucleic acids are NOT DETECTED.  The SARS-CoV-2 RNA is generally detectable in upper respiratory specimens during the acute  phase of infection. The lowest concentration of SARS-CoV-2 viral copies this assay can detect is 138 copies/mL. A negative result does not preclude SARS-Cov-2 infection and should not be used as the sole basis for treatment or other patient management decisions. A negative result may occur with  improper specimen collection/handling, submission of specimen other than nasopharyngeal swab, presence of viral mutation(s) within the areas targeted by this assay, and inadequate number of viral copies(<138 copies/mL). A negative result must be combined with clinical observations, patient history, and epidemiological information. The expected result is Negative.  Fact Sheet for Patients:  EntrepreneurPulse.com.au  Fact Sheet for Healthcare Providers:  IncredibleEmployment.be  This test is no t yet approved or cleared by the [place] FDA and  has been authorized for detection and/or diagnosis of SARS-CoV-2 by FDA under an Emergency Use Authorization (EUA). This EUA will remain  in effect (meaning this test can be used) for the duration  of the COVID-19 declaration under Section 564(b)(1) of the Act, 21 U.S.C.section 360bbb-3(b)(1), unless the authorization is terminated  or revoked sooner.       Influenza A by PCR NEGATIVE NEGATIVE Final   Influenza B by PCR NEGATIVE NEGATIVE Final    Comment: (NOTE) The Xpert Xpress SARS-CoV-2/FLU/RSV plus assay is intended as an aid in the diagnosis of influenza from Nasopharyngeal swab specimens and should not be used as a sole basis for treatment. Nasal washings and aspirates are unacceptable for Xpert Xpress SARS-CoV-2/FLU/RSV testing.  Fact Sheet for Patients: EntrepreneurPulse.com.au  Fact Sheet for Healthcare Providers: IncredibleEmployment.be  This test is not yet approved or cleared by the [place] FDA and has been authorized for detection and/or diagnosis of  SARS-CoV-2 by FDA under an Emergency Use Authorization (EUA). This EUA will remain in effect (meaning this test can be used) for the duration of the COVID-19 declaration under Section 564(b)(1) of the Act, 21 U.S.C. section 360bbb-3(b)(1), unless the authorization is terminated or revoked.  Performed at [facility], [place] [address], [place], [place] [address]   Blood Culture (routine x 2)     Status: None (Preliminary result)   Collection Time: 11/23/20  2:39 PM   Specimen: BLOOD  Result Value Ref Range Status   Specimen Description   Final    BLOOD RIGHT ANTECUBITAL Performed at [facility] [address] [place], [place] [address]    Special Requests   Final    BOTTLES DRAWN AEROBIC AND ANAEROBIC Blood Culture results may not be optimal due to an excessive volume of blood received in culture bottles Performed at [facility] [address] [place], [place] [address]    Culture   Final    NO GROWTH 3 DAYS Performed at [facility], [place] [address] [place], [place] [address]    Report Status PENDING  Incomplete  Blood Culture (routine x 2)     Status: None (Preliminary result)   Collection Time: 11/23/20  2:41 PM   Specimen: BLOOD RIGHT HAND  Result Value Ref Range Status   Specimen Description   Final    BLOOD RIGHT HAND Performed at [facility] [address], [place], [place] [address]    Special Requests   Final    BOTTLES DRAWN AEROBIC AND ANAEROBIC Blood Culture results may not be optimal due to an excessive volume of blood received in culture bottles Performed at [facility] [address] [place], [place] [address]    Culture   Final    NO GROWTH 3 DAYS Performed at [facility], [place] [address], [place], [place] [address]    Report Status PENDING  Incomplete  Culture, blood (routine x 2)     Status: None (Preliminary result)   Collection Time:  11/24/20  1:38 AM   Specimen: BLOOD LEFT HAND  Result Value Ref Range Status   Specimen Description   Final    BLOOD LEFT HAND Performed at [facility] [address] [place], [place] [address]    Special Requests   Final    BOTTLES DRAWN AEROBIC ONLY Blood Culture adequate volume Performed at [facility] [address] [place], [place] [address]    Culture   Final    NO GROWTH 2 DAYS Performed at [facility] [address], [place],  [address]    Report Status PENDING  Incomplete  Culture,  blood (routine x 2)     Status: None (Preliminary result)   Collection Time: 11/24/20  1:38 AM   Specimen: BLOOD RIGHT HAND  Result Value Ref Range Status   Specimen Description   Final    BLOOD RIGHT HAND Performed at [facility] [address] [place], [place] [address]    Special Requests   Final    BOTTLES DRAWN AEROBIC ONLY Blood Culture adequate volume Performed at [facility] [address], [place], [place] [address]    Culture   Final    NO GROWTH 2 DAYS Performed at [facility] [address] [place], [place] [address]    Report Status PENDING  Incomplete  Urine Culture     Status: Abnormal (Preliminary result)   Collection Time: 11/24/20  2:29 PM   Specimen: Urine, Clean Catch  Result Value Ref Range Status   Specimen Description   Final    URINE, CLEAN CATCH Performed at [facility], [place] [address], [place], [place] [address]    Special Requests   Final    NONE Performed at [facility], [place] [address], [place], [place] [address]    Culture (A)  Final    20,000 COLONIES/mL ESCHERICHIA COLI SUSCEPTIBILITIES TO FOLLOW Performed at [facility], [place] [address], [place], [place] [address]    Report Status PENDING  Incomplete     Labs: BNP (last 3 results) No results for input(s): BNP in the last 8760 hours. Basic  Metabolic Panel: Recent Labs  Lab 11/23/20 1441 11/24/20 0138 11/24/20 0559 11/25/20 0543 11/26/20 0501  NA 137 136  --  135 138  K 4.7 4.8  --  4.3 4.2  CL 106 108  --  107 110  CO2 25 19*  --  20* 20*  GLUCOSE 63* 88  --  161* 99  BUN 68* 70*  --  61* 51*  CREATININE 4.67* 4.22* 4.33* 3.65* 3.36*  CALCIUM 8.8* 8.3*  --  7.8* 7.9*   Liver Function Tests: Recent Labs  Lab 11/23/20 1441 11/24/20 0138  AST 20 20  ALT 19 18  ALKPHOS 65 58  BILITOT 0.9 0.9  PROT 7.1 6.5  ALBUMIN 3.4* 3.3*   Recent Labs  Lab 11/23/20 1441  LIPASE 30   Recent Labs  Lab 11/23/20 1441  AMMONIA 17   CBC: Recent Labs  Lab 11/23/20 1441 11/24/20 0138 11/24/20 0559 11/24/20 1207 11/25/20 0543 11/26/20 0501  WBC 8.3 8.0 9.2 8.8 6.7 6.7  NEUTROABS 5.6 5.7  --   --   --   --   HGB 8.0* 7.7* 7.2* 7.5* 6.8* 7.2*  HCT 25.4* 24.0* 22.4* 22.7* 21.4* 22.0*  MCV 93.7 93.4 93.7 90.4 93.9 90.2  PLT 218 193 189 119* 174 189   Cardiac Enzymes: Recent Labs  Lab 11/24/20 0138  CKTOTAL 239*  236*  CKMB 2.8   BNP: Invalid input(s): POCBNP CBG: Recent Labs  Lab 11/25/20 1653 11/25/20 2047 11/26/20 0755 11/26/20 1204 11/26/20 1722  GLUCAP 148* 157* 113* 103* 131*   D-Dimer No results for input(s): DDIMER in the last 72 hours. Hgb A1c No results for input(s): HGBA1C in the last 72 hours. Lipid Profile No results for input(s): CHOL, HDL, LDLCALC, TRIG, CHOLHDL, LDLDIRECT in the last 72 hours. Thyroid function studies No results for input(s): TSH, T4TOTAL, T3FREE, THYROIDAB in the last 72 hours.  Invalid input(s): FREET3 Anemia work up [employer]  11/24/20 0559  VITAMINB12 199  FOLATE 12.5  FERRITIN 65  TIBC 260  IRON 23*  RETICCTPCT 1.3   Urinalysis    Component Value Date/Time   COLORURINE YELLOW 11/23/2020 1602   APPEARANCEUR CLOUDY (A) 11/23/2020 1602   LABSPEC 1.012 11/23/2020 1602   PHURINE 8.0 11/23/2020 1602   GLUCOSEU NEGATIVE 11/23/2020 1602   HGBUR  NEGATIVE 11/23/2020 1602   HGBUR negative 10/12/2008 1103   BILIRUBINUR NEGATIVE 11/23/2020 1602   BILIRUBINUR negative 10/21/2019 1447   BILIRUBINUR negative 03/10/2017 1459   KETONESUR NEGATIVE 11/23/2020 1602   PROTEINUR 100 (A) 11/23/2020 1602   UROBILINOGEN 0.2 10/21/2019 1447   UROBILINOGEN 0.2 09/30/2019 1151   NITRITE NEGATIVE 11/23/2020 1602   LEUKOCYTESUR LARGE (A) 11/23/2020 1602   Sepsis Labs Invalid input(s): PROCALCITONIN,  WBC,  LACTICIDVEN Microbiology Recent Results (from the past 240 hour(s))  Resp Panel by RT-PCR (Flu A&B, Covid) Nasopharyngeal Swab     Status: None   Collection Time: 11/23/20  7:55 AM   Specimen: Nasopharyngeal Swab; Nasopharyngeal(NP) swabs in vial transport medium  Result Value Ref Range Status   SARS Coronavirus 2 by RT PCR NEGATIVE NEGATIVE Final    Comment: (NOTE) SARS-CoV-2 target nucleic acids are NOT DETECTED.  The SARS-CoV-2 RNA is generally detectable in upper respiratory specimens during the acute phase of infection. The lowest concentration of SARS-CoV-2 viral copies this assay can detect is 138 copies/mL. A negative result does not preclude SARS-Cov-2 infection and should not be used as the sole basis for treatment or other patient management decisions. A negative result may occur with  improper specimen collection/handling, submission of specimen other than nasopharyngeal swab, presence of viral mutation(s) within the areas targeted by this assay, and inadequate number of viral copies(<138 copies/mL). A negative result must be combined with clinical observations, patient history, and epidemiological information. The expected result is Negative.  Fact Sheet for Patients:  EntrepreneurPulse.com.au  Fact Sheet for Healthcare Providers:  IncredibleEmployment.be  This test is no t yet approved or cleared by the [place] FDA and  has been authorized for detection and/or diagnosis of  SARS-CoV-2 by FDA under an Emergency Use Authorization (EUA). This EUA will remain  in effect (meaning this test can be used) for the duration of the COVID-19 declaration under Section 564(b)(1) of the Act, 21 U.S.C.section 360bbb-3(b)(1), unless the authorization is terminated  or revoked sooner.       Influenza A by PCR NEGATIVE NEGATIVE Final   Influenza B by PCR NEGATIVE NEGATIVE Final    Comment: (NOTE) The Xpert Xpress SARS-CoV-2/FLU/RSV plus assay is intended as an aid in the diagnosis of influenza from Nasopharyngeal swab specimens and should not be used as a sole basis for treatment. Nasal washings and aspirates are unacceptable for Xpert Xpress SARS-CoV-2/FLU/RSV testing.  Fact Sheet for Patients: EntrepreneurPulse.com.au  Fact Sheet for Healthcare Providers: IncredibleEmployment.be  This test is not yet approved or cleared by the [place] FDA and has been authorized for detection and/or diagnosis of SARS-CoV-2 by FDA under an Emergency Use Authorization (EUA). This EUA will remain in effect (meaning this test can be used) for the duration of the COVID-19 declaration under Section 564(b)(1) of the Act, 21 U.S.C. section 360bbb-3(b)(1), unless the authorization is terminated or revoked.  Performed at [facility], [place] [address], [place], [place] [address]   Blood Culture (routine x 2)     Status: None (Preliminary result)   Collection Time: 11/23/20  2:39 PM   Specimen: BLOOD  Result Value Ref Range Status   Specimen Description   Final    BLOOD RIGHT ANTECUBITAL Performed at [facility] [address] [place], [place] [address]    Special Requests   Final    BOTTLES DRAWN AEROBIC AND ANAEROBIC Blood Culture results may not be optimal due to an excessive volume of blood received in culture bottles Performed at [facility] [place] [address], [place], [place]  [address]    Culture   Final    NO GROWTH 3 DAYS Performed at [facility], [place] [address], [place], [place] [address]    Report Status PENDING  Incomplete  Blood Culture (routine x 2)     Status: None (Preliminary result)   Collection Time: 11/23/20  2:41 PM   Specimen: BLOOD RIGHT HAND  Result Value Ref Range Status   Specimen Description   Final    BLOOD RIGHT HAND Performed at [facility] [address], [place], [place] [address]    Special Requests   Final    BOTTLES DRAWN AEROBIC AND ANAEROBIC Blood Culture results may not be optimal due to an excessive volume of blood received in culture bottles Performed at [facility] [address], [place], [place] [address]    Culture   Final    NO GROWTH 3 DAYS Performed at [facility], [place] [address], [place], [place] [address]    Report Status PENDING  Incomplete  Culture, blood (routine x 2)     Status: None (Preliminary result)   Collection Time: 11/24/20  1:38 AM   Specimen: BLOOD LEFT HAND  Result Value Ref Range Status   Specimen Description   Final    BLOOD LEFT HAND Performed at [facility] [address], [place], [place] [address]    Special Requests   Final    BOTTLES DRAWN AEROBIC ONLY Blood Culture adequate volume Performed at [facility] [address], [place], [place] [address]    Culture   Final    NO GROWTH 2 DAYS Performed at [facility] [address], [place], [place] [address]    Report Status PENDING  Incomplete  Culture, blood (routine x 2)     Status: None (Preliminary result)   Collection Time: 11/24/20  1:38 AM   Specimen: BLOOD RIGHT HAND  Result Value Ref Range Status   Specimen Description   Final    BLOOD RIGHT HAND Performed at [facility] [address], [place], [place] [address]    Special Requests   Final    BOTTLES DRAWN AEROBIC ONLY Blood Culture adequate  volume Performed at [facility] [address], [place], [place] [address]    Culture   Final    NO GROWTH 2 DAYS Performed at [facility] [address], [place], [place] [address]    Report Status PENDING  Incomplete  Urine Culture     Status: Abnormal (Preliminary result)   Collection Time: 11/24/20  2:29 PM   Specimen: Urine, Clean Catch  Result Value Ref Range Status   Specimen Description   Final    URINE, CLEAN CATCH Performed at [facility], [place] [address], [place], [place] [address]    Special Requests   Final    NONE Performed at [facility], [place] [address] [place], [place] [address]    Culture (A)  Final  20,000 COLONIES/mL ESCHERICHIA COLI SUSCEPTIBILITIES TO FOLLOW Performed at [facility], [place] [address], [place], [place] [address]    Report Status PENDING  Incomplete     Time coordinating discharge: 75mns  SIGNED:   JKathie Dike MD  [facility] 11/26/2020, 7:40 PM   If 7PM-7AM, please contact night-coverage [email or web address]

## 2020-11-26 NOTE — Plan of Care (Signed)
  Problem: Education: Goal: Knowledge of General Education information will improve Description: Including pain rating scale, medication(s)/side effects and non-pharmacologic comfort measures 11/26/2020 1801 by Shelton Silvas, RN Outcome: Adequate for Discharge 11/26/2020 1406 by Shelton Silvas, RN Outcome: Adequate for Discharge   Problem: Health Behavior/Discharge Planning: Goal: Ability to manage health-related needs will improve 11/26/2020 1801 by Shelton Silvas, RN Outcome: Adequate for Discharge 11/26/2020 1406 by Shelton Silvas, RN Outcome: Adequate for Discharge   Problem: Clinical Measurements: Goal: Ability to maintain clinical measurements within normal limits will improve 11/26/2020 1801 by Shelton Silvas, RN Outcome: Adequate for Discharge 11/26/2020 1406 by Shelton Silvas, RN Outcome: Adequate for Discharge Goal: Will remain free from infection 11/26/2020 1801 by Shelton Silvas, RN Outcome: Adequate for Discharge 11/26/2020 1406 by Shelton Silvas, RN Outcome: Adequate for Discharge Goal: Diagnostic test results will improve 11/26/2020 1801 by Shelton Silvas, RN Outcome: Adequate for Discharge 11/26/2020 1406 by Shelton Silvas, RN Outcome: Adequate for Discharge Goal: Respiratory complications will improve 11/26/2020 1801 by Shelton Silvas, RN Outcome: Adequate for Discharge 11/26/2020 1406 by Shelton Silvas, RN Outcome: Adequate for Discharge Goal: Cardiovascular complication will be avoided 11/26/2020 1801 by Shelton Silvas, RN Outcome: Adequate for Discharge 11/26/2020 1406 by Shelton Silvas, RN Outcome: Adequate for Discharge   Problem: Activity: Goal: Risk for activity intolerance will decrease 11/26/2020 1801 by Shelton Silvas, RN Outcome: Adequate for Discharge 11/26/2020 1406 by Shelton Silvas, RN Outcome: Adequate for Discharge   Problem: Nutrition: Goal: Adequate nutrition will be maintained 11/26/2020 1801 by Shelton Silvas,  RN Outcome: Adequate for Discharge 11/26/2020 1406 by Shelton Silvas, RN Outcome: Adequate for Discharge   Problem: Coping: Goal: Level of anxiety will decrease 11/26/2020 1801 by Shelton Silvas, RN Outcome: Adequate for Discharge 11/26/2020 1406 by Shelton Silvas, RN Outcome: Adequate for Discharge   Problem: Elimination: Goal: Will not experience complications related to bowel motility 11/26/2020 1801 by Shelton Silvas, RN Outcome: Adequate for Discharge 11/26/2020 1406 by Shelton Silvas, RN Outcome: Adequate for Discharge Goal: Will not experience complications related to urinary retention 11/26/2020 1801 by Shelton Silvas, RN Outcome: Adequate for Discharge 11/26/2020 1406 by Shelton Silvas, RN Outcome: Adequate for Discharge   Problem: Pain Managment: Goal: General experience of comfort will improve 11/26/2020 1801 by Shelton Silvas, RN Outcome: Adequate for Discharge 11/26/2020 1406 by Shelton Silvas, RN Outcome: Adequate for Discharge   Problem: Safety: Goal: Ability to remain free from injury will improve 11/26/2020 1801 by Shelton Silvas, RN Outcome: Adequate for Discharge 11/26/2020 1406 by Shelton Silvas, RN Outcome: Adequate for Discharge   Problem: Skin Integrity: Goal: Risk for impaired skin integrity will decrease 11/26/2020 1801 by Shelton Silvas, RN Outcome: Adequate for Discharge 11/26/2020 1406 by Shelton Silvas, RN Outcome: Adequate for Discharge

## 2020-11-26 NOTE — TOC Initial Note (Addendum)
Transition of Care [facility]) - Initial/Assessment Note    Patient Details  Name: Lorraine Turner MRN: [id number] Date of Birth: 1960/08/05  Transition of Care [facility]) CM/SW Contact:    Lynnell Catalan, RN Phone Number: 11/26/2020, 11:19 AM  Clinical Narrative:                  Pt was admitted to [facility] from [facility]. Psych note indicates that pt no longer needs inpt psych placement. Pt has been given shelter resources previously and she is connected with the [facility]. She has been working with the [employer], which has been helping her with housing resources. She goes to the [facility] and has an appointment there on 10/26. Pt RN was provided with bus passes to give to the pt at dc. [facility] info placed on AVS for outpatient follow up.             Admission diagnosis:  UTI (urinary tract infection) [N39.0] ARF (acute renal failure) ([place]) [N17.9] AKI (acute kidney injury) ([place]) [N17.9] Patient Active Problem List   Diagnosis Date Noted   UTI (urinary tract infection) 11/24/2020   ARF (acute renal failure) ([place]) 11/24/2020   Diarrhea 11/21/2020   Acute worsening of stage 4 chronic kidney disease ([place])    Hypertensive urgency 09/01/2020   CKD (chronic kidney disease), stage III ([place]) 09/01/2020   DKA (diabetic ketoacidoses) 09/12/2018   AKI (acute kidney injury) ([place]) 09/12/2018   Adjustment disorder with mixed disturbance of emotions and conduct    MDD (major depressive disorder), severe ([place]) 123456   Umbilical hernia with obstruction 11/24/2015   Symptomatic cholelithiasis 11/24/2015   Type 2 diabetes mellitus with hyperglycemia ([place])    Suicide attempt ([place]) 05/06/2014   Schizoaffective disorder, depressive type ([place]) 08/14/2011   UNSPECIFIED ANEMIA 01/23/2009   Primary hypertension 10/12/2008   HYPERCHOLESTEROLEMIA 01/01/2007   PANIC DISORDER 01/01/2007   Obsessive compulsive disorder 01/01/2007   PCP:  [name], MD Pharmacy:   [facility] [address] [place] [address] Phone: [phone] Fax: [phone]  [id number] - [place], [place] - [address] [place] [address] Phone: [phone] Fax: [phone]     Social Determinants of Health (SDOH) Interventions    Readmission Risk Interventions Readmission Risk Prevention Plan 11/26/2020 11/24/2020  Transportation Screening Complete Complete  PCP or Specialist Appt within 3-5 Days Complete Complete  HRI or Home Care Consult Complete Complete  Social Work Consult for [place] Planning/Counseling Complete Complete  Palliative Care Screening Not Applicable Not Applicable  Medication Review Press photographer) Complete Complete  Some recent data might be hidden

## 2020-11-26 NOTE — Plan of Care (Signed)

## 2020-11-27 ENCOUNTER — Telehealth: Payer: Self-pay

## 2020-11-27 LAB — TYPE AND SCREEN
ABO/RH(D): O POS
Antibody Screen: NEGATIVE
Unit division: 0
Unit division: 0

## 2020-11-27 LAB — BPAM RBC
Blood Product Expiration Date: 202211132359
Blood Product Expiration Date: 202211152359
ISSUE DATE / TIME: 202210151140
ISSUE DATE / TIME: 202210161339
Unit Type and Rh: 5100
Unit Type and Rh: 5100

## 2020-11-27 LAB — URINE CULTURE: Culture: 20000 — AB

## 2020-11-27 NOTE — Telephone Encounter (Signed)
Transition Care Management Follow-up Telephone Call  Call placed to patient with Asante McCoy, LCSW present for the call.  Date of discharge and from where: 11/26/2020, [facility]  How have you been since you were released from the hospital? Patient's thoughts were quite scattered. There was a lot of noise in the background throughout the entire call. She  said she mentally came apart and ended up in the hospital and she  gave a brief overview of her hospitalization.  Any questions or concerns? Yes - homelessness.  She is interested in [employer] and can discuss that option with Asante when she sees her this week. Another option for her might be [facility] program.   Items Reviewed: Did the pt receive and understand the discharge instructions provided?  She has them but needs to review them.  Medications obtained and verified?  She was not sure what medications she has.  She said she has some of them and will need to get some more from a friend's house. She said she has " stuff all over the place."  Instructed her to gather her medications and review the med list and to call this CM with questions.  Per AVS, she is to stop taking glipizide and lantus.  She said she is not aware of those changes. Scheduled her to meet with Benard Halsted, [facility] when she is at [facility] on 11/29/2020.  Other? No  Any new allergies since your discharge? No  Dietary orders reviewed? No Do you have support at home?  She is currently staying at the [facility] in with her daughter and grandchildren.   Home Care and Equipment/Supplies: Were home health services ordered? no If so, what is the name of the agency? N/a  Has the agency set up a time to come to the patient's home? not applicable Were any new equipment or medical supplies ordered?  No What is the name of the medical supply agency? N/a Were you able to get the supplies/equipment? not applicable Do you have any questions related to the use of the equipment or  supplies? No  Functional Questionnaire: (I = Independent and D = Dependent) ADLs: independent  Follow up appointments reviewed:  PCP Hospital f/u appt confirmed? Yes  Scheduled to see Dr Margarita Rana  on 12/06/2020. She did not want to schedule an appointment to be seen sooner  [facility] f/u appt confirmed? Yes  Scheduled to see [place], LCSW/[facility] 11/29/2020.  She will need to schedule further follow up with a psychiatrist.  Are transportation arrangements needed? No  - she said she has a lot of bus passes. Reminded her that she can use Medicaid transportation for rides to her medical appointments If their condition worsens, is the pt aware to call PCP or go to the Emergency Dept.? Yes Was the patient provided with contact information for the PCP's office or ED? Yes Was to pt encouraged to call back with questions or concerns? Yes

## 2020-11-27 NOTE — Telephone Encounter (Signed)
From the discharge call:  Patient's thoughts were quite scattered. There was a lot of noise in the background throughout the entire call. She  said she mentally came apart and ended up in the hospital and she  gave a brief overview of her hospitalization.   She is homeless and  interested in [employer] and can discuss that option with Asante when she sees her this week. Another option for her might be [facility] program. She is currently staying at the [facility] with her daughter and grandchildren.   She was not sure what medications she has.  She said she has some of them and will need to get some more from a friend's house. She said she has " stuff all over the place."  Instructed her to gather her medications and review the med list and to call this CM with questions.  Per AVS, she is to stop taking glipizide and lantus.  She said she is not aware of those changes. Scheduled her to meet with Benard Halsted, [facility] when she is at [facility] on 11/29/2020   Scheduled to see Dr Margarita Rana  on 12/06/2020. She did not want to schedule an appointment to be seen sooner    Scheduled to see [place], [place] 11/29/2020.  She will need to schedule further follow up with a psychiatrist.

## 2020-11-28 LAB — CULTURE, BLOOD (ROUTINE X 2)
Culture: NO GROWTH
Culture: NO GROWTH

## 2020-11-28 NOTE — Progress Notes (Unsigned)
    S:    PCP: Dr. Margarita Rana  Patient presents for diabetes evaluation, education, and management. Patient was referred on 11/27/20 after hospital discharge due to confusion about medication regimen. Patient was last seen by Primary Care Provider on 09/26/20. Most recently admitted to [facility] 10/14-16/22 with UTI, found to have AKI on CKD, at baseline at time of discharge (BL Cr 3-3.5). Lantus and glipizide were discontinued at discharge but no explanation of why. Hx multiple admissions to behavioral health for suicidal ideations.   Today, *** Sees SW at 9am -plan for meds? BG at home?  -not on a statin LDL 119 (simva, atorva in the past) -[place] on 10/26   Family/Social History:  -Fhx: DM, HTN, stroke, HLD in mother  Insurance coverage/medication affordability: ***  Medication adherence reported *** .   Current diabetes medications include: *** Current hypertension medications include: hydralazine 25 mg TID, carvedilol 3.125 mg BID, amlodipine 10 mg daily Current hyperlipidemia medications include: none  Patient {Actions; denies-reports:120008} hypoglycemic events.  Patient reported dietary habits: Eats *** meals/day Breakfast:*** Lunch:*** Dinner:*** Snacks:*** Drinks:***  Patient-reported exercise habits: ***   Patient {Actions; denies-reports:120008} nocturia (nighttime urination).  Patient {Actions; denies-reports:120008} neuropathy (nerve pain). Patient {Actions; denies-reports:120008} visual changes. Patient {Actions; denies-reports:120008} self foot exams.     O:    Lab Results  Component Value Date   HGBA1C 8.3 (H) 11/19/2020   There were no vitals filed for this visit.  Lipid Panel     Component Value Date/Time   CHOL 197 11/18/2020 1825   CHOL 235 (H) 01/13/2019 1507   TRIG 144 11/18/2020 1825   HDL 49 11/18/2020 1825   HDL 61 01/13/2019 1507   CHOLHDL 4.0 11/18/2020 1825   VLDL 29 11/18/2020 1825   LDLCALC 119 (H) 11/18/2020 1825   LDLCALC 158  (H) 01/13/2019 1507    Home fasting blood sugars: ***  2 hour post-meal/random blood sugars: ***.  Clinical Atherosclerotic Cardiovascular Disease (ASCVD): No  The 10-year ASCVD risk score (Arnett DK, et al., 2019) is: 12%   Values used to calculate the score:     Age: 60 years     Sex: Female     Is Non-Hispanic African American: No     Diabetic: Yes     Tobacco smoker: No     Systolic Blood Pressure: [id number] mmHg     Is BP treated: Yes     HDL Cholesterol: 49 mg/dL     Total Cholesterol: 197 mg/dL    A/P: Diabetes longstanding currently uncontrolled with most recent A1c 8.3, stable from previous 8.2 in July. Patient is able to verbalize appropriate hypoglycemia management plan. Medication adherence appears ***. Control is suboptimal due to ***. -*** -Extensively discussed pathophysiology of diabetes, recommended lifestyle interventions, dietary effects on blood sugar control -Counseled on s/sx of and management of hypoglycemia -Next A1C anticipated 02/2021.   ASCVD risk - primary prevention in patient with diabetes. Last LDL {Is/is not:9024} controlled. ASCVD risk score {Is/is not:9024} >20%  - {Desc; low/moderate/high:110033} intensity statin indicated. Aspirin {Is/is not:9024} indicated.  -{Meds adjust:18428} aspirin *** mg  -{Meds adjust:18428} ***statin *** mg.   Hypertension longstanding*** currently ***.  Blood pressure goal = *** mmHg. Medication adherence ***.  Blood pressure control is suboptimal due to ***. -***  Total time in face to face counseling *** minutes.   Follow up Pharmacist/PCP*** Clinic Visit in ***.

## 2020-11-29 ENCOUNTER — Ambulatory Visit: Payer: Medicaid Other | Admitting: Pharmacist

## 2020-11-29 ENCOUNTER — Ambulatory Visit: Payer: Medicaid Other | Admitting: Clinical

## 2020-11-29 LAB — CULTURE, BLOOD (ROUTINE X 2)
Culture: NO GROWTH
Culture: NO GROWTH
Special Requests: ADEQUATE
Special Requests: ADEQUATE

## 2020-12-06 ENCOUNTER — Inpatient Hospital Stay: Payer: Medicaid Other | Admitting: Family Medicine

## 2020-12-07 ENCOUNTER — Encounter (HOSPITAL_COMMUNITY): Payer: Self-pay

## 2020-12-07 ENCOUNTER — Emergency Department (HOSPITAL_COMMUNITY)
Admission: EM | Admit: 2020-12-07 | Discharge: 2020-12-07 | Disposition: A | Discharge status: Home / Self Care | Payer: Medicaid Other | Attending: Emergency Medicine | Admitting: Emergency Medicine

## 2020-12-07 DIAGNOSIS — I10 Essential (primary) hypertension: Secondary | ICD-10-CM

## 2020-12-07 DIAGNOSIS — E1165 Type 2 diabetes mellitus with hyperglycemia: Secondary | ICD-10-CM | POA: Diagnosis not present

## 2020-12-07 DIAGNOSIS — R112 Nausea with vomiting, unspecified: Secondary | ICD-10-CM | POA: Diagnosis present

## 2020-12-07 DIAGNOSIS — E1122 Type 2 diabetes mellitus with diabetic chronic kidney disease: Secondary | ICD-10-CM | POA: Diagnosis not present

## 2020-12-07 DIAGNOSIS — N184 Chronic kidney disease, stage 4 (severe): Secondary | ICD-10-CM | POA: Insufficient documentation

## 2020-12-07 DIAGNOSIS — Z9114 Patient's other noncompliance with medication regimen: Secondary | ICD-10-CM | POA: Diagnosis not present

## 2020-12-07 DIAGNOSIS — Z87891 Personal history of nicotine dependence: Secondary | ICD-10-CM | POA: Diagnosis not present

## 2020-12-07 DIAGNOSIS — I129 Hypertensive chronic kidney disease with stage 1 through stage 4 chronic kidney disease, or unspecified chronic kidney disease: Secondary | ICD-10-CM | POA: Diagnosis not present

## 2020-12-07 DIAGNOSIS — Z79899 Other long term (current) drug therapy: Secondary | ICD-10-CM | POA: Diagnosis not present

## 2020-12-07 DIAGNOSIS — R739 Hyperglycemia, unspecified: Secondary | ICD-10-CM

## 2020-12-07 DIAGNOSIS — E860 Dehydration: Secondary | ICD-10-CM | POA: Insufficient documentation

## 2020-12-07 DIAGNOSIS — Z5900 Homelessness unspecified: Secondary | ICD-10-CM | POA: Insufficient documentation

## 2020-12-07 LAB — COMPREHENSIVE METABOLIC PANEL
ALT: 18 U/L (ref 0–44)
AST: 20 U/L (ref 15–41)
Albumin: 4 g/dL (ref 3.5–5.0)
Alkaline Phosphatase: 114 U/L (ref 38–126)
Anion gap: 11 (ref 5–15)
BUN: 47 mg/dL — ABNORMAL HIGH (ref 6–20)
CO2: 25 mmol/L (ref 22–32)
Calcium: 9.4 mg/dL (ref 8.9–10.3)
Chloride: 101 mmol/L (ref 98–111)
Creatinine, Ser: 3.53 mg/dL — ABNORMAL HIGH (ref 0.44–1.00)
GFR, Estimated: 14 mL/min — ABNORMAL LOW (ref >60)
Glucose, Bld: 270 mg/dL — ABNORMAL HIGH (ref 70–99)
Potassium: 4.8 mmol/L (ref 3.5–5.1)
Sodium: 137 mmol/L (ref 135–145)
Total Bilirubin: 0.9 mg/dL (ref 0.3–1.2)
Total Protein: 8.9 g/dL — ABNORMAL HIGH (ref 6.5–8.1)

## 2020-12-07 LAB — BLOOD GAS, VENOUS
Acid-Base Excess: 0.2 mmol/L (ref 0.0–2.0)
Bicarbonate: 26.2 mmol/L (ref 20.0–28.0)
FIO2: 21
O2 Saturation: 65.6 %
Patient temperature: 98.6
pCO2, Ven: 50.3 mmHg (ref 44.0–60.0)
pH, Ven: 7.336 (ref 7.250–7.430)
pO2, Ven: 36.3 mmHg (ref 32.0–45.0)

## 2020-12-07 LAB — LIPASE, BLOOD: Lipase: 23 U/L (ref 11–51)

## 2020-12-07 LAB — I-STAT BETA HCG BLOOD, ED (MC, WL, AP ONLY): I-stat hCG, quantitative: <5 m[IU]/mL (ref <5)

## 2020-12-07 LAB — CBC
HCT: 37.8 % (ref 36.0–46.0)
Hemoglobin: 12.7 g/dL (ref 12.0–15.0)
MCH: 30.2 pg (ref 26.0–34.0)
MCHC: 33.6 g/dL (ref 30.0–36.0)
MCV: 89.8 fL (ref 80.0–100.0)
Platelets: 323 10*3/uL (ref 150–400)
RBC: 4.21 MIL/uL (ref 3.87–5.11)
RDW: 13.2 % (ref 11.5–15.5)
WBC: 10.4 10*3/uL (ref 4.0–10.5)
nRBC: 0 % (ref 0.0–0.2)

## 2020-12-07 LAB — CBG MONITORING, ED: Glucose-Capillary: 270 mg/dL — ABNORMAL HIGH (ref 70–99)

## 2020-12-07 MED ORDER — ONDANSETRON 4 MG PO TBDP
4.0000 mg | ORAL_TABLET | Freq: Once | ORAL | Status: AC | PRN
  Administered 2020-12-07: 4 mg via ORAL
  Filled 2020-12-07: qty 1

## 2020-12-07 MED ORDER — ONDANSETRON 4 MG PO TBDP: 4.0000 mg | ORAL_TABLET | Freq: Three times a day (TID) | ORAL | 0 refills | Status: DC | PRN

## 2020-12-07 MED ORDER — BUSPIRONE HCL 10 MG PO TABS
5.0000 mg | ORAL_TABLET | Freq: Once | ORAL | Status: AC
  Administered 2020-12-07: 5 mg via ORAL
  Filled 2020-12-07: qty 1

## 2020-12-07 MED ORDER — CARVEDILOL 3.125 MG PO TABS
3.1250 mg | ORAL_TABLET | Freq: Once | ORAL | Status: AC
  Administered 2020-12-07: 3.125 mg via ORAL
  Filled 2020-12-07: qty 1

## 2020-12-07 MED ORDER — GABAPENTIN 100 MG PO CAPS
100.0000 mg | ORAL_CAPSULE | Freq: Once | ORAL | Status: AC
  Administered 2020-12-07: 100 mg via ORAL
  Filled 2020-12-07: qty 1

## 2020-12-07 MED ORDER — SODIUM CHLORIDE 0.9 % IV BOLUS
1000.0000 mL | Freq: Once | INTRAVENOUS | Status: AC
  Administered 2020-12-07: 1000 mL via INTRAVENOUS

## 2020-12-07 MED ORDER — HYDRALAZINE HCL 25 MG PO TABS
25.0000 mg | ORAL_TABLET | Freq: Once | ORAL | Status: AC
  Administered 2020-12-07: 25 mg via ORAL
  Filled 2020-12-07: qty 1

## 2020-12-07 MED ORDER — AMLODIPINE BESYLATE 5 MG PO TABS
10.0000 mg | ORAL_TABLET | Freq: Once | ORAL | Status: AC
  Administered 2020-12-07: 10 mg via ORAL
  Filled 2020-12-07: qty 2

## 2020-12-07 MED ORDER — SODIUM CHLORIDE 0.9 % IV SOLN
12.5000 mg | Freq: Once | INTRAVENOUS | Status: AC
  Administered 2020-12-07: 12.5 mg via INTRAVENOUS
  Filled 2020-12-07: qty 12.5

## 2020-12-07 NOTE — ED Provider Notes (Signed)
[facility] DEPT Provider Note   CSN: [id number] Arrival date & time: 12/07/20  1005     History Chief Complaint  Patient presents with   Emesis    Lorraine Turner is a 60 y.o. female.  Pt presents to the ED today with n/v.  Pt was recently admitted from 10/14-16 for aki from diarrhea and uti. Kidney function improved and she was d/c.  She has not taken any of her meds due to financial issues.  She denies any pain.  No f/c.  Pt said she just feels real bad.  She was given an oral zofran in triage, but still feels very nauseous.      Past Medical History:  Diagnosis Date   Anxiety    Bipolar 1 disorder ([place])    Depression    Diabetes mellitus without complication ([place])    Gallstones    Hyperlipidemia    Hypertension    MDD (major depressive disorder), recurrent episode, moderate ([place]) 11/18/2020   Neuropathy    Schizophrenia ([place])    Seizures ([place])    X1- febrile seizure as a child-none since   Suicide ideation 09/12/2018    Patient Active Problem List   Diagnosis Date Noted   UTI (urinary tract infection) 11/24/2020   ARF (acute renal failure) ([place]) 11/24/2020   Diarrhea 11/21/2020   Acute worsening of stage 4 chronic kidney disease ([place])    Hypertensive urgency 09/01/2020   CKD (chronic kidney disease), stage III ([place]) 09/01/2020   DKA (diabetic ketoacidoses) 09/12/2018   AKI (acute kidney injury) ([place]) 09/12/2018   Adjustment disorder with mixed disturbance of emotions and conduct    MDD (major depressive disorder), severe ([place]) 72/53/6644   Umbilical hernia with obstruction 11/24/2015   Symptomatic cholelithiasis 11/24/2015   Type 2 diabetes mellitus with hyperglycemia ([place])    Suicide attempt ([place]) 05/06/2014   Schizoaffective disorder, depressive type ([place]) 08/14/2011   UNSPECIFIED ANEMIA 01/23/2009   Primary hypertension 10/12/2008   HYPERCHOLESTEROLEMIA 01/01/2007   PANIC DISORDER 01/01/2007   Obsessive compulsive  disorder 01/01/2007    Past Surgical History:  Procedure Laterality Date   CESAREAN SECTION  05/1998   X 1   CHOLECYSTECTOMY N/A 03/27/2016   Procedure: LAPAROSCOPIC CHOLECYSTECTOMY;  Surgeon: Olean Ree, MD;  Location: [facility] ORS;  Service: General;  Laterality: N/A;   TONSILLECTOMY AND ADENOIDECTOMY     age 88   TUBAL LIGATION       OB History   No obstetric history on file.     Family History  Problem Relation Age of Onset   Diabetes Mother    Heart disease Mother    Kidney disease Mother    Stroke Mother    Hyperlipidemia Mother    Diabetes Maternal Aunt     Social History   Tobacco Use   Smoking status: Former    Types: Cigarettes    Quit date: 03/14/2013    Years since quitting: 7.7   Smokeless tobacco: Never   Tobacco comments:    1 cigarette1-2 times year, only when she gets stressed  Vaping Use   Vaping Use: Never used  Substance Use Topics   Alcohol use: Yes    Alcohol/week: 1.0 standard drink    Types: 1 Cans of beer per week    Comment: One can of beer every 2-3 months.    Drug use: No    Home Medications Prior to Admission medications   Medication Sig Start Date End Date Taking? Authorizing Provider  ondansetron (ZOFRAN ODT) 4 MG disintegrating tablet Take 1 tablet (4 mg total) by mouth every 8 (eight) hours as needed for nausea or vomiting. 12/07/20  Yes Isla Pence, MD  amLODipine (NORVASC) 10 MG tablet Take 1 tablet (10 mg total) by mouth daily. 11/26/20   Kathie Dike, MD  Blood Glucose Monitoring Suppl (TRUE METRIX METER) w/Device KIT 1 Device by Does not apply route 3 (three) times daily. For blood sugar checks 09/13/18   Barb Merino, MD  busPIRone (BUSPAR) 5 MG tablet Take 1 tablet (5 mg total) by mouth 2 (two) times daily. 11/26/20   Kathie Dike, MD  carvedilol (COREG) 3.125 MG tablet Take 1 tablet (3.125 mg total) by mouth 2 (two) times daily with a meal. 11/26/20   Kathie Dike, MD  ferrous sulfate 325 (65 FE) MG tablet Take 1  tablet (325 mg total) by mouth daily. 11/26/20 11/26/21  Kathie Dike, MD  gabapentin (NEURONTIN) 100 MG capsule Take 1 capsule (100 mg total) by mouth 3 (three) times daily. 11/26/20 02/24/21  Kathie Dike, MD  glucose blood test strip Use as instructed 10/28/18   Argentina Donovan, PA-C  hydrALAZINE (APRESOLINE) 25 MG tablet Take 1 tablet (25 mg total) by mouth 3 (three) times daily. 11/26/20   Kathie Dike, MD  Insulin Syringe-Needle U-100 (SAFETY INSULIN SYRINGES) 30G X 5/16" 0.5 ML MISC 1 each by Does not apply route at bedtime. 10/21/19   Argentina Donovan, PA-C  QUEtiapine (SEROQUEL) 100 MG tablet Take 1 tablet (100 mg total) by mouth at bedtime. For mood control 11/26/20   Kathie Dike, MD  risperiDONE (RISPERDAL) 1 MG tablet Take 1 tablet (1 mg total) by mouth at bedtime. 11/26/20   Kathie Dike, MD  sertraline (ZOLOFT) 50 MG tablet Take 1 tablet (50 mg total) by mouth daily. 11/26/20   Kathie Dike, MD  TRUEplus Lancets 28G MISC 1 each by Does not apply route 3 (three) times daily. For blood sugar checks 09/13/18   Barb Merino, MD  vitamin B-12 (CYANOCOBALAMIN) 1000 MCG tablet Take 1 tablet (1,000 mcg total) by mouth daily. 11/26/20   Kathie Dike, MD    Allergies    Lisinopril and Amoxicillin  Review of Systems   Review of Systems  Gastrointestinal:  Positive for nausea and vomiting.  All other systems reviewed and are negative.  Physical Exam Updated Vital Signs BP (!) 228/113   Pulse 82   Temp 97.8 F (36.6 C) (Oral)   Resp 16   Ht 5' 2" (1.575 m)   Wt 69.4 kg   SpO2 100%   BMI 27.98 kg/m   Physical Exam Vitals and nursing note reviewed.  Constitutional:      Appearance: Normal appearance.  HENT:     Head: Normocephalic and atraumatic.     Right Ear: External ear normal.     Left Ear: External ear normal.     Nose: Nose normal.     Mouth/Throat:     Mouth: Mucous membranes are dry.  Eyes:     Extraocular Movements: Extraocular movements  intact.     Conjunctiva/sclera: Conjunctivae normal.     Pupils: Pupils are equal, round, and reactive to light.  Cardiovascular:     Rate and Rhythm: Normal rate and regular rhythm.     Pulses: Normal pulses.     Heart sounds: Normal heart sounds.  Pulmonary:     Effort: Pulmonary effort is normal.     Breath sounds: Normal breath sounds.  Abdominal:  General: Abdomen is flat. Bowel sounds are normal.     Palpations: Abdomen is soft.  Musculoskeletal:        General: Normal range of motion.     Cervical back: Normal range of motion and neck supple.  Skin:    General: Skin is warm.     Capillary Refill: Capillary refill takes less than 2 seconds.  Neurological:     General: No focal deficit present.     Mental Status: She is alert and oriented to person, place, and time.  Psychiatric:        Mood and Affect: Mood normal.    ED Results / Procedures / Treatments   Labs (all labs ordered are listed, but only abnormal results are displayed) Labs Reviewed  COMPREHENSIVE METABOLIC PANEL - Abnormal; Notable for the following components:      Result Value   Glucose, Bld 270 (*)    BUN 47 (*)    Creatinine, Ser 3.53 (*)    Total Protein 8.9 (*)    GFR, Estimated 14 (*)    All other components within normal limits  CBG MONITORING, ED - Abnormal; Notable for the following components:   Glucose-Capillary 270 (*)    All other components within normal limits  LIPASE, BLOOD  CBC  BLOOD GAS, VENOUS  URINALYSIS, ROUTINE W REFLEX MICROSCOPIC  I-STAT BETA HCG BLOOD, ED ([facility], [facility], [facility] ONLY)    EKG None  Radiology No results found.  Procedures Procedures   Medications Ordered in ED Medications  ondansetron (ZOFRAN-ODT) disintegrating tablet 4 mg (4 mg Oral Given 12/07/20 1032)  sodium chloride 0.9 % bolus 1,000 mL (1,000 mLs Intravenous New Bag/Given 12/07/20 1711)  promethazine (PHENERGAN) 12.5 mg in sodium chloride 0.9 % 50 mL IVPB (12.5 mg Intravenous New Bag/Given 12/07/20  1719)  amLODipine (NORVASC) tablet 10 mg (10 mg Oral Given 12/07/20 1822)  busPIRone (BUSPAR) tablet 5 mg (5 mg Oral Given 12/07/20 1823)  carvedilol (COREG) tablet 3.125 mg (3.125 mg Oral Given 12/07/20 1823)  gabapentin (NEURONTIN) capsule 100 mg (100 mg Oral Given 12/07/20 1823)  hydrALAZINE (APRESOLINE) tablet 25 mg (25 mg Oral Given 12/07/20 1823)    ED Course  I have reviewed the triage vital signs and the nursing notes.  Pertinent labs & imaging results that were available during my care of the patient were reviewed by me and considered in my medical decision making (see chart for details).    MDM Rules/Calculators/A&P                           Pt is not in DKA.  SW consulted to help with meds.  SW said pt has medicaid, so she is not a candidate for [facility].  Pt [facility] go to [facility] and they can help her with her meds.   She is to call them tomorrow.  Pt's kidney function is stable.  She is able to tolerate po fluids.  She is given a dose in the ED of her bp meds.  She is to return if worse.   Final Clinical Impression(s) / ED Diagnoses Final diagnoses:  Dehydration  Nausea and vomiting, unspecified vomiting type  Hypertension, unspecified type  Hyperglycemia  Noncompliance with medication regimen  Homeless single person    Rx / DC Orders ED Discharge Orders          Ordered    ondansetron (ZOFRAN ODT) 4 MG disintegrating tablet  Every 8 hours PRN  12/07/20 1901             Isla Pence, MD 12/07/20 1901

## 2020-12-07 NOTE — ED Triage Notes (Signed)
Pt BIBA from home. Pt c/o N/V since last night. Pt states emesis x "35-45" times. Non compliant with meds for HTN and DM due to cost.   243/110 (denies headache) 76 HR  98% RA 245 FSBG NSR

## 2020-12-07 NOTE — Care Management (Signed)
Consult for medication assistance . Patient is homeless , no employment listed. Patient has Medicaid, and is ineligible for [facility] program due to having insurance. She does have a PCP, [facility].

## 2020-12-07 NOTE — ED Provider Notes (Signed)
Emergency Medicine Provider Triage Evaluation Note  Lorraine Turner , a 60 y.o. female  was evaluated in triage.  Pt complains of nausea and vomiting that started last night.  Patient endorses roughly 35-45 episodes of nonbloody, nonbilious emesis.  Denies associated abdominal pain.  No fever or chills.  No diarrhea.  Patient recently admitted to [facility] from 10/8-10/13.  She was then transferred to [facility] long ED where she was admitted to the hospital on 10/14-10/16 due to UTI and AKI.  Patient states she has not been taking any of her medications due to financial restraints.  Patient found to be hypertensive in triage at 170/90.  Patient denies any chest pain, visual changes, or headaches.  Review of Systems  Positive: Nausea and vomiting Negative: CP  Physical Exam  BP (!) 170/90 (BP Location: Left Arm)   Pulse 92   Temp 97.8 F (36.6 C) (Oral)   Resp 18   SpO2 100%  Gen:   Awake, no distress   Resp:  Normal effort  MSK:   Moves extremities without difficulty  Other:  Abdomen soft, nondistended, nontender to palpation in all quadrants without guarding or peritoneal signs. No rebound.   Medical Decision Making  Medically screening exam initiated at 10:31 AM.  Appropriate orders placed.  Lorraine Turner was informed that the remainder of the evaluation will be completed by another provider, this initial triage assessment does not replace that evaluation, and the importance of remaining in the ED until their evaluation is complete.  Labs Zofran given in triage   Karie Kirks 12/07/20 1033    Regan Lemming, MD 12/07/20 1155

## 2020-12-09 ENCOUNTER — Emergency Department (HOSPITAL_COMMUNITY)
Admission: EM | Admit: 2020-12-09 | Discharge: 2020-12-09 | Disposition: A | Discharge status: Home / Self Care | Payer: Medicaid Other | Attending: Student | Admitting: Student

## 2020-12-09 ENCOUNTER — Emergency Department (HOSPITAL_COMMUNITY): Payer: Medicaid Other

## 2020-12-09 ENCOUNTER — Other Ambulatory Visit: Payer: Self-pay

## 2020-12-09 ENCOUNTER — Encounter (HOSPITAL_COMMUNITY): Payer: Self-pay | Admitting: Emergency Medicine

## 2020-12-09 DIAGNOSIS — N184 Chronic kidney disease, stage 4 (severe): Secondary | ICD-10-CM | POA: Insufficient documentation

## 2020-12-09 DIAGNOSIS — E1122 Type 2 diabetes mellitus with diabetic chronic kidney disease: Secondary | ICD-10-CM | POA: Insufficient documentation

## 2020-12-09 DIAGNOSIS — I129 Hypertensive chronic kidney disease with stage 1 through stage 4 chronic kidney disease, or unspecified chronic kidney disease: Secondary | ICD-10-CM | POA: Diagnosis not present

## 2020-12-09 DIAGNOSIS — Z87891 Personal history of nicotine dependence: Secondary | ICD-10-CM | POA: Insufficient documentation

## 2020-12-09 DIAGNOSIS — R1032 Left lower quadrant pain: Secondary | ICD-10-CM | POA: Diagnosis present

## 2020-12-09 DIAGNOSIS — R112 Nausea with vomiting, unspecified: Secondary | ICD-10-CM | POA: Diagnosis not present

## 2020-12-09 LAB — URINALYSIS, ROUTINE W REFLEX MICROSCOPIC
Bilirubin Urine: NEGATIVE
Glucose, UA: 150 mg/dL — AB
Ketones, ur: 5 mg/dL — AB
Leukocytes,Ua: NEGATIVE
Nitrite: NEGATIVE
Protein, ur: >=300 mg/dL — AB
Specific Gravity, Urine: 1.014 (ref 1.005–1.030)
pH: 7 (ref 5.0–8.0)

## 2020-12-09 LAB — CBC
HCT: 36.4 % (ref 36.0–46.0)
Hemoglobin: 12.5 g/dL (ref 12.0–15.0)
MCH: 30.5 pg (ref 26.0–34.0)
MCHC: 34.3 g/dL (ref 30.0–36.0)
MCV: 88.8 fL (ref 80.0–100.0)
Platelets: 314 10*3/uL (ref 150–400)
RBC: 4.1 MIL/uL (ref 3.87–5.11)
RDW: 13.1 % (ref 11.5–15.5)
WBC: 13.2 10*3/uL — ABNORMAL HIGH (ref 4.0–10.5)
nRBC: 0 % (ref 0.0–0.2)

## 2020-12-09 LAB — COMPREHENSIVE METABOLIC PANEL
ALT: 16 U/L (ref 0–44)
AST: 17 U/L (ref 15–41)
Albumin: 3.6 g/dL (ref 3.5–5.0)
Alkaline Phosphatase: 100 U/L (ref 38–126)
Anion gap: 11 (ref 5–15)
BUN: 60 mg/dL — ABNORMAL HIGH (ref 6–20)
CO2: 23 mmol/L (ref 22–32)
Calcium: 9 mg/dL (ref 8.9–10.3)
Chloride: 100 mmol/L (ref 98–111)
Creatinine, Ser: 3.75 mg/dL — ABNORMAL HIGH (ref 0.44–1.00)
GFR, Estimated: 13 mL/min — ABNORMAL LOW (ref >60)
Glucose, Bld: 237 mg/dL — ABNORMAL HIGH (ref 70–99)
Potassium: 4 mmol/L (ref 3.5–5.1)
Sodium: 134 mmol/L — ABNORMAL LOW (ref 135–145)
Total Bilirubin: 1 mg/dL (ref 0.3–1.2)
Total Protein: 7.8 g/dL (ref 6.5–8.1)

## 2020-12-09 LAB — LIPASE, BLOOD: Lipase: 26 U/L (ref 11–51)

## 2020-12-09 MED ORDER — ONDANSETRON HCL 4 MG/2ML IJ SOLN
4.0000 mg | Freq: Once | INTRAMUSCULAR | Status: AC
  Administered 2020-12-09: 4 mg via INTRAVENOUS
  Filled 2020-12-09: qty 2

## 2020-12-09 MED ORDER — DROPERIDOL 2.5 MG/ML IJ SOLN
1.2500 mg | Freq: Once | INTRAMUSCULAR | Status: AC
  Administered 2020-12-09: 1.25 mg via INTRAVENOUS
  Filled 2020-12-09: qty 2

## 2020-12-09 MED ORDER — CARVEDILOL 3.125 MG PO TABS: 3.1250 mg | ORAL_TABLET | Freq: Two times a day (BID) | ORAL | Status: DC

## 2020-12-09 MED ORDER — HYDRALAZINE HCL 20 MG/ML IJ SOLN
10.0000 mg | Freq: Once | INTRAMUSCULAR | Status: AC
  Administered 2020-12-09: 10 mg via INTRAVENOUS
  Filled 2020-12-09: qty 1

## 2020-12-09 MED ORDER — LACTATED RINGERS IV BOLUS
1000.0000 mL | Freq: Once | INTRAVENOUS | Status: AC
  Administered 2020-12-09: 1000 mL via INTRAVENOUS

## 2020-12-09 MED ORDER — AMLODIPINE BESYLATE 5 MG PO TABS
10.0000 mg | ORAL_TABLET | Freq: Every day | ORAL | Status: DC
  Administered 2020-12-09: 10 mg via ORAL
  Filled 2020-12-09: qty 2

## 2020-12-09 NOTE — ED Provider Notes (Signed)
[facility] EMERGENCY DEPARTMENT Provider Note   CSN: [id number] Arrival date & time: 12/09/20  8756     History Chief Complaint  Patient presents with   Abdominal Pain    Hypertensive    Lorraine Turner is a 60 y.o. undomiciled female with PMH bipolar 1, diabetes, HTN, HLD, schizophrenia who presents the emergency department for evaluation of abdominal pain nausea and vomiting.  Patient was recently admitted in mid October for a urinary tract infection and dehydration.  She was seen at [facility] yesterday with stable laboratory evaluation and symptoms improved with Zofran.  She was able to tolerate p.o. and was ultimately discharged but was unable to fill any of her prescriptions and returns with persistent vomiting.  She arrives hypertensive with systolics greater than 433 but is otherwise hemodynamically stable.  She endorses lower abdominal tenderness.  Denies chest pain, shortness of breath, headache, cough, fever or other systemic symptoms.   Abdominal Pain Associated symptoms: nausea and vomiting   Associated symptoms: no chest pain, no chills, no cough, no dysuria, no fever, no hematuria, no shortness of breath and no sore throat       Past Medical History:  Diagnosis Date   Anxiety    Bipolar 1 disorder ([place])    Depression    Diabetes mellitus without complication ([place])    Gallstones    Hyperlipidemia    Hypertension    MDD (major depressive disorder), recurrent episode, moderate ([place]) 11/18/2020   Neuropathy    Schizophrenia ([place])    Seizures ([place])    X1- febrile seizure as a child-none since   Suicide ideation 09/12/2018    Patient Active Problem List   Diagnosis Date Noted   UTI (urinary tract infection) 11/24/2020   ARF (acute renal failure) ([place]) 11/24/2020   Diarrhea 11/21/2020   Acute worsening of stage 4 chronic kidney disease ([place])    Hypertensive urgency 09/01/2020   CKD (chronic kidney disease), stage III ([place]) 09/01/2020   DKA  (diabetic ketoacidoses) 09/12/2018   AKI (acute kidney injury) ([place]) 09/12/2018   Adjustment disorder with mixed disturbance of emotions and conduct    MDD (major depressive disorder), severe ([place]) 29/51/8841   Umbilical hernia with obstruction 11/24/2015   Symptomatic cholelithiasis 11/24/2015   Type 2 diabetes mellitus with hyperglycemia ([place])    Suicide attempt ([place]) 05/06/2014   Schizoaffective disorder, depressive type ([place]) 08/14/2011   UNSPECIFIED ANEMIA 01/23/2009   Primary hypertension 10/12/2008   HYPERCHOLESTEROLEMIA 01/01/2007   PANIC DISORDER 01/01/2007   Obsessive compulsive disorder 01/01/2007    Past Surgical History:  Procedure Laterality Date   CESAREAN SECTION  05/1998   X 1   CHOLECYSTECTOMY N/A 03/27/2016   Procedure: LAPAROSCOPIC CHOLECYSTECTOMY;  Surgeon: Olean Ree, MD;  Location: [facility] ORS;  Service: General;  Laterality: N/A;   TONSILLECTOMY AND ADENOIDECTOMY     age 22   TUBAL LIGATION       OB History   No obstetric history on file.     Family History  Problem Relation Age of Onset   Diabetes Mother    Heart disease Mother    Kidney disease Mother    Stroke Mother    Hyperlipidemia Mother    Diabetes Maternal Aunt     Social History   Tobacco Use   Smoking status: Former    Types: Cigarettes    Quit date: 03/14/2013    Years since quitting: 7.7   Smokeless tobacco: Never   Tobacco comments:  1 cigarette1-2 times year, only when she gets stressed  Vaping Use   Vaping Use: Never used  Substance Use Topics   Alcohol use: Yes    Alcohol/week: 1.0 standard drink    Types: 1 Cans of beer per week    Comment: One can of beer every 2-3 months.    Drug use: No    Home Medications Prior to Admission medications   Medication Sig Start Date End Date Taking? Authorizing Provider  amLODipine (NORVASC) 10 MG tablet Take 1 tablet (10 mg total) by mouth daily. 11/26/20   Kathie Dike, MD  Blood Glucose Monitoring Suppl (TRUE METRIX METER)  w/Device KIT 1 Device by Does not apply route 3 (three) times daily. For blood sugar checks 09/13/18   Barb Merino, MD  busPIRone (BUSPAR) 5 MG tablet Take 1 tablet (5 mg total) by mouth 2 (two) times daily. 11/26/20   Kathie Dike, MD  carvedilol (COREG) 3.125 MG tablet Take 1 tablet (3.125 mg total) by mouth 2 (two) times daily with a meal. 11/26/20   Kathie Dike, MD  ferrous sulfate 325 (65 FE) MG tablet Take 1 tablet (325 mg total) by mouth daily. 11/26/20 11/26/21  Kathie Dike, MD  gabapentin (NEURONTIN) 100 MG capsule Take 1 capsule (100 mg total) by mouth 3 (three) times daily. 11/26/20 02/24/21  Kathie Dike, MD  glucose blood test strip Use as instructed 10/28/18   Argentina Donovan, PA-C  hydrALAZINE (APRESOLINE) 25 MG tablet Take 1 tablet (25 mg total) by mouth 3 (three) times daily. 11/26/20   Kathie Dike, MD  Insulin Syringe-Needle U-100 (SAFETY INSULIN SYRINGES) 30G X 5/16" 0.5 ML MISC 1 each by Does not apply route at bedtime. 10/21/19   Argentina Donovan, PA-C  ondansetron (ZOFRAN ODT) 4 MG disintegrating tablet Take 1 tablet (4 mg total) by mouth every 8 (eight) hours as needed for nausea or vomiting. 12/07/20   Isla Pence, MD  QUEtiapine (SEROQUEL) 100 MG tablet Take 1 tablet (100 mg total) by mouth at bedtime. For mood control 11/26/20   Kathie Dike, MD  risperiDONE (RISPERDAL) 1 MG tablet Take 1 tablet (1 mg total) by mouth at bedtime. 11/26/20   Kathie Dike, MD  sertraline (ZOLOFT) 50 MG tablet Take 1 tablet (50 mg total) by mouth daily. 11/26/20   Kathie Dike, MD  TRUEplus Lancets 28G MISC 1 each by Does not apply route 3 (three) times daily. For blood sugar checks 09/13/18   Barb Merino, MD  vitamin B-12 (CYANOCOBALAMIN) 1000 MCG tablet Take 1 tablet (1,000 mcg total) by mouth daily. 11/26/20   Kathie Dike, MD    Allergies    Lisinopril and Amoxicillin  Review of Systems   Review of Systems  Constitutional:  Negative for chills and  fever.  HENT:  Negative for ear pain and sore throat.   Eyes:  Negative for pain and visual disturbance.  Respiratory:  Negative for cough and shortness of breath.   Cardiovascular:  Negative for chest pain and palpitations.  Gastrointestinal:  Positive for abdominal pain, nausea and vomiting.  Genitourinary:  Negative for dysuria and hematuria.  Musculoskeletal:  Negative for arthralgias and back pain.  Skin:  Negative for color change and rash.  Neurological:  Negative for seizures and syncope.  All other systems reviewed and are negative.  Physical Exam Updated Vital Signs BP (!) 178/77 (BP Location: Left Arm)   Pulse 88   Temp 98 F (36.7 C) (Oral)   Resp 19   SpO2  98%   Physical Exam Vitals and nursing note reviewed.  Constitutional:      General: She is not in acute distress.    Appearance: She is well-developed. She is ill-appearing.  HENT:     Head: Normocephalic and atraumatic.  Eyes:     Conjunctiva/sclera: Conjunctivae normal.  Cardiovascular:     Rate and Rhythm: Normal rate and regular rhythm.     Heart sounds: No murmur heard. Pulmonary:     Effort: Pulmonary effort is normal. No respiratory distress.     Breath sounds: Normal breath sounds.  Abdominal:     Palpations: Abdomen is soft.     Tenderness: There is abdominal tenderness in the suprapubic area and left lower quadrant.  Musculoskeletal:     Cervical back: Neck supple.  Skin:    General: Skin is warm and dry.  Neurological:     Mental Status: She is alert.    ED Results / Procedures / Treatments   Labs (all labs ordered are listed, but only abnormal results are displayed) Labs Reviewed  COMPREHENSIVE METABOLIC PANEL - Abnormal; Notable for the following components:      Result Value   Sodium 134 (*)    Glucose, Bld 237 (*)    BUN 60 (*)    Creatinine, Ser 3.75 (*)    GFR, Estimated 13 (*)    All other components within normal limits  CBC - Abnormal; Notable for the following components:    WBC 13.2 (*)    All other components within normal limits  URINALYSIS, ROUTINE W REFLEX MICROSCOPIC - Abnormal; Notable for the following components:   APPearance CLOUDY (*)    Glucose, UA 150 (*)    Hgb urine dipstick SMALL (*)    Ketones, ur 5 (*)    Protein, ur >=300 (*)    Bacteria, UA RARE (*)    All other components within normal limits  LIPASE, BLOOD    EKG None  Radiology CT ABDOMEN PELVIS WO CONTRAST  Result Date: 12/09/2020 CLINICAL DATA:  Left lower quadrant abdominal pain with emesis EXAM: CT ABDOMEN AND PELVIS WITHOUT CONTRAST TECHNIQUE: Multidetector CT imaging of the abdomen and pelvis was performed following the standard protocol without IV contrast. COMPARISON:  CT scan of the abdomen and pelvis 11/23/2020 FINDINGS: Lower chest: No acute abnormality. Hepatobiliary: No focal liver abnormality is seen. Status post cholecystectomy. No biliary dilatation. Pancreas: Marked fatty atrophy of the pancreas. No inflammatory changes or mass identified. Spleen: Normal in size without focal abnormality. Adrenals/Urinary Tract: Adrenal glands are unremarkable. Kidneys are normal, without renal calculi, focal lesion, or hydronephrosis. Bladder is distended at approximately 10.3 x 9.8 x 8.8 cm (volume = 470 cm^3). Stomach/Bowel: Stomach is within normal limits. Appendix appears normal. No evidence of bowel wall thickening, distention, or inflammatory changes. Vascular/Lymphatic: Limited evaluation in the absence of intravenous contrast. Trace atherosclerotic calcifications along the abdominal aorta. No aneurysm. No suspicious lymphadenopathy. Reproductive: Uterus and bilateral adnexa are unremarkable. Other: Small fat containing umbilical hernia. No evidence of ascites. Musculoskeletal: No acute or significant osseous findings. IMPRESSION: 1. No acute abnormality within the abdomen or pelvis. 2. Mild distension of the bladder which contains approximately 470 mL. Recommend clinical correlation  for signs and symptoms of urinary retention. 3.  Aortic Atherosclerosis (ICD10-I70.0). Electronically Signed   By: Jacqulynn Cadet M.D.   On: 12/09/2020 09:55    Procedures Procedures   Medications Ordered in ED Medications  carvedilol (COREG) tablet 3.125 mg (has no administration in time  range)  amLODipine (NORVASC) tablet 10 mg (10 mg Oral Given 12/09/20 0925)  lactated ringers bolus 1,000 mL (0 mLs Intravenous Stopped 12/09/20 1131)  ondansetron (ZOFRAN) injection 4 mg (4 mg Intravenous Given 12/09/20 0834)  ondansetron (ZOFRAN) injection 4 mg (4 mg Intravenous Given 12/09/20 1039)  hydrALAZINE (APRESOLINE) injection 10 mg (10 mg Intravenous Given 12/09/20 1051)  droperidol (INAPSINE) 2.5 MG/ML injection 1.25 mg (1.25 mg Intravenous Given 12/09/20 1216)    ED Course  I have reviewed the triage vital signs and the nursing notes.  Pertinent labs & imaging results that were available during my care of the patient were reviewed by me and considered in my medical decision making (see chart for details).    MDM Rules/Calculators/A&P                           Patient seen the emergency department for evaluation of abdominal pain nausea and vomiting.  Physical exam reveals mild lower quadrant tenderness but is otherwise unremarkable.  Laboratory evaluation with a BUN of 60 and creatinine 3.75 which is baseline for this patient, mild leukocytosis to 13.2 likely elevated in the setting of stress demargination from vomiting.  Urinalysis with no evidence of infection.  Patient was given 2 rounds of Zofran but continued to vomit and thus was given droperidol leading to resolution of her symptoms.  CT abdomen pelvis unremarkable.  Patient able to tolerate p.o. on reevaluation and patient then discharged. Final Clinical Impression(s) / ED Diagnoses Final diagnoses:  Nausea and vomiting, unspecified vomiting type    Rx / DC Orders ED Discharge Orders     None        Pamela Maddy, Debe Coder,  MD 12/09/20 [phone]

## 2020-12-09 NOTE — ED Notes (Signed)
Pt asleep at this time

## 2020-12-09 NOTE — ED Notes (Signed)
Pt was assisted to the lobby by this RN. Pt had her cell phone in hand & stated she was waiting on her ride. All d/c papers were given, A&Ox4 at departure.

## 2020-12-09 NOTE — ED Triage Notes (Addendum)
Patient reports persistent pain across her abdomen with multiple emesis this week , no diarrhea or fever . Hypertensive at triage .

## 2020-12-15 ENCOUNTER — Other Ambulatory Visit: Payer: Self-pay

## 2020-12-15 ENCOUNTER — Encounter (HOSPITAL_COMMUNITY): Payer: Self-pay | Admitting: Emergency Medicine

## 2020-12-15 ENCOUNTER — Inpatient Hospital Stay (HOSPITAL_COMMUNITY)
Admission: EM | Admit: 2020-12-15 | Discharge: 2020-12-20 | DRG: 305 | Disposition: A | Discharge status: Home / Self Care | Payer: Medicaid Other | Attending: Internal Medicine | Admitting: Internal Medicine

## 2020-12-15 ENCOUNTER — Emergency Department (HOSPITAL_COMMUNITY): Payer: Medicaid Other

## 2020-12-15 DIAGNOSIS — Z9851 Tubal ligation status: Secondary | ICD-10-CM

## 2020-12-15 DIAGNOSIS — Z9151 Personal history of suicidal behavior: Secondary | ICD-10-CM

## 2020-12-15 DIAGNOSIS — F4325 Adjustment disorder with mixed disturbance of emotions and conduct: Secondary | ICD-10-CM | POA: Diagnosis present

## 2020-12-15 DIAGNOSIS — Z91138 Patient's unintentional underdosing of medication regimen for other reason: Secondary | ICD-10-CM

## 2020-12-15 DIAGNOSIS — I160 Hypertensive urgency: Secondary | ICD-10-CM | POA: Diagnosis present

## 2020-12-15 DIAGNOSIS — Z8249 Family history of ischemic heart disease and other diseases of the circulatory system: Secondary | ICD-10-CM

## 2020-12-15 DIAGNOSIS — Z794 Long term (current) use of insulin: Secondary | ICD-10-CM

## 2020-12-15 DIAGNOSIS — Z83438 Family history of other disorder of lipoprotein metabolism and other lipidemia: Secondary | ICD-10-CM

## 2020-12-15 DIAGNOSIS — F251 Schizoaffective disorder, depressive type: Secondary | ICD-10-CM | POA: Diagnosis present

## 2020-12-15 DIAGNOSIS — F319 Bipolar disorder, unspecified: Secondary | ICD-10-CM | POA: Diagnosis present

## 2020-12-15 DIAGNOSIS — F322 Major depressive disorder, single episode, severe without psychotic features: Secondary | ICD-10-CM | POA: Diagnosis present

## 2020-12-15 DIAGNOSIS — Z79899 Other long term (current) drug therapy: Secondary | ICD-10-CM

## 2020-12-15 DIAGNOSIS — E785 Hyperlipidemia, unspecified: Secondary | ICD-10-CM | POA: Diagnosis present

## 2020-12-15 DIAGNOSIS — Z880 Allergy status to penicillin: Secondary | ICD-10-CM

## 2020-12-15 DIAGNOSIS — Z888 Allergy status to other drugs, medicaments and biological substances status: Secondary | ICD-10-CM

## 2020-12-15 DIAGNOSIS — Z9049 Acquired absence of other specified parts of digestive tract: Secondary | ICD-10-CM

## 2020-12-15 DIAGNOSIS — E871 Hypo-osmolality and hyponatremia: Secondary | ICD-10-CM | POA: Diagnosis present

## 2020-12-15 DIAGNOSIS — R112 Nausea with vomiting, unspecified: Secondary | ICD-10-CM | POA: Diagnosis not present

## 2020-12-15 DIAGNOSIS — I129 Hypertensive chronic kidney disease with stage 1 through stage 4 chronic kidney disease, or unspecified chronic kidney disease: Secondary | ICD-10-CM | POA: Diagnosis present

## 2020-12-15 DIAGNOSIS — Z833 Family history of diabetes mellitus: Secondary | ICD-10-CM

## 2020-12-15 DIAGNOSIS — Z5982 Transportation insecurity: Secondary | ICD-10-CM

## 2020-12-15 DIAGNOSIS — Z5901 Sheltered homelessness: Secondary | ICD-10-CM

## 2020-12-15 DIAGNOSIS — N179 Acute kidney failure, unspecified: Secondary | ICD-10-CM | POA: Diagnosis not present

## 2020-12-15 DIAGNOSIS — N184 Chronic kidney disease, stage 4 (severe): Secondary | ICD-10-CM

## 2020-12-15 DIAGNOSIS — E876 Hypokalemia: Secondary | ICD-10-CM | POA: Diagnosis present

## 2020-12-15 DIAGNOSIS — F419 Anxiety disorder, unspecified: Secondary | ICD-10-CM | POA: Diagnosis present

## 2020-12-15 DIAGNOSIS — E7800 Pure hypercholesterolemia, unspecified: Secondary | ICD-10-CM | POA: Diagnosis present

## 2020-12-15 DIAGNOSIS — Z841 Family history of disorders of kidney and ureter: Secondary | ICD-10-CM

## 2020-12-15 DIAGNOSIS — F32A Depression, unspecified: Secondary | ICD-10-CM | POA: Diagnosis present

## 2020-12-15 DIAGNOSIS — Z20822 Contact with and (suspected) exposure to covid-19: Secondary | ICD-10-CM | POA: Diagnosis present

## 2020-12-15 DIAGNOSIS — F429 Obsessive-compulsive disorder, unspecified: Secondary | ICD-10-CM | POA: Diagnosis present

## 2020-12-15 DIAGNOSIS — K0889 Other specified disorders of teeth and supporting structures: Secondary | ICD-10-CM | POA: Diagnosis present

## 2020-12-15 DIAGNOSIS — Z87891 Personal history of nicotine dependence: Secondary | ICD-10-CM

## 2020-12-15 DIAGNOSIS — E1165 Type 2 diabetes mellitus with hyperglycemia: Secondary | ICD-10-CM | POA: Diagnosis present

## 2020-12-15 LAB — COMPREHENSIVE METABOLIC PANEL
ALT: 14 U/L (ref 0–44)
AST: 15 U/L (ref 15–41)
Albumin: 3.3 g/dL — ABNORMAL LOW (ref 3.5–5.0)
Alkaline Phosphatase: 87 U/L (ref 38–126)
Anion gap: 12 (ref 5–15)
BUN: 41 mg/dL — ABNORMAL HIGH (ref 6–20)
CO2: 27 mmol/L (ref 22–32)
Calcium: 8.9 mg/dL (ref 8.9–10.3)
Chloride: 93 mmol/L — ABNORMAL LOW (ref 98–111)
Creatinine, Ser: 3.85 mg/dL — ABNORMAL HIGH (ref 0.44–1.00)
GFR, Estimated: 13 mL/min — ABNORMAL LOW (ref >60)
Glucose, Bld: 282 mg/dL — ABNORMAL HIGH (ref 70–99)
Potassium: 3.3 mmol/L — ABNORMAL LOW (ref 3.5–5.1)
Sodium: 132 mmol/L — ABNORMAL LOW (ref 135–145)
Total Bilirubin: 1.2 mg/dL (ref 0.3–1.2)
Total Protein: 7.3 g/dL (ref 6.5–8.1)

## 2020-12-15 LAB — CBC WITH DIFFERENTIAL/PLATELET
Abs Immature Granulocytes: 0.04 10*3/uL (ref 0.00–0.07)
Basophils Absolute: 0.1 10*3/uL (ref 0.0–0.1)
Basophils Relative: 1 %
Eosinophils Absolute: 0 10*3/uL (ref 0.0–0.5)
Eosinophils Relative: 0 %
HCT: 34.1 % — ABNORMAL LOW (ref 36.0–46.0)
Hemoglobin: 12 g/dL (ref 12.0–15.0)
Immature Granulocytes: 0 %
Lymphocytes Relative: 13 %
Lymphs Abs: 1.4 10*3/uL (ref 0.7–4.0)
MCH: 29.9 pg (ref 26.0–34.0)
MCHC: 35.2 g/dL (ref 30.0–36.0)
MCV: 84.8 fL (ref 80.0–100.0)
Monocytes Absolute: 1 10*3/uL (ref 0.1–1.0)
Monocytes Relative: 9 %
Neutro Abs: 8.5 10*3/uL — ABNORMAL HIGH (ref 1.7–7.7)
Neutrophils Relative %: 77 %
Platelets: 379 10*3/uL (ref 150–400)
RBC: 4.02 MIL/uL (ref 3.87–5.11)
RDW: 12.5 % (ref 11.5–15.5)
WBC: 11 10*3/uL — ABNORMAL HIGH (ref 4.0–10.5)
nRBC: 0 % (ref 0.0–0.2)

## 2020-12-15 LAB — RESP PANEL BY RT-PCR (FLU A&B, COVID) ARPGX2
Influenza A by PCR: NEGATIVE
Influenza B by PCR: NEGATIVE
SARS Coronavirus 2 by RT PCR: NEGATIVE

## 2020-12-15 LAB — LIPASE, BLOOD: Lipase: 31 U/L (ref 11–51)

## 2020-12-15 MED ORDER — AMLODIPINE BESYLATE 10 MG PO TABS
10.0000 mg | ORAL_TABLET | Freq: Every day | ORAL | Status: DC
  Administered 2020-12-16 – 2020-12-20 (×5): 10 mg via ORAL
  Filled 2020-12-15 (×5): qty 1

## 2020-12-15 MED ORDER — FAMOTIDINE IN NACL 20-0.9 MG/50ML-% IV SOLN
20.0000 mg | Freq: Once | INTRAVENOUS | Status: AC
  Administered 2020-12-15: 20 mg via INTRAVENOUS
  Filled 2020-12-15: qty 50

## 2020-12-15 MED ORDER — BUSPIRONE HCL 5 MG PO TABS
5.0000 mg | ORAL_TABLET | Freq: Two times a day (BID) | ORAL | Status: DC
  Administered 2020-12-16 – 2020-12-20 (×10): 5 mg via ORAL
  Filled 2020-12-15 (×10): qty 1

## 2020-12-15 MED ORDER — HYDRALAZINE HCL 20 MG/ML IJ SOLN
10.0000 mg | Freq: Once | INTRAMUSCULAR | Status: AC
  Administered 2020-12-15: 10 mg via INTRAVENOUS
  Filled 2020-12-15: qty 1

## 2020-12-15 MED ORDER — LABETALOL HCL 5 MG/ML IV SOLN
5.0000 mg | INTRAVENOUS | Status: DC | PRN
  Administered 2020-12-16 – 2020-12-18 (×2): 5 mg via INTRAVENOUS
  Filled 2020-12-15 (×2): qty 4

## 2020-12-15 MED ORDER — METOCLOPRAMIDE HCL 5 MG/ML IJ SOLN
10.0000 mg | Freq: Once | INTRAMUSCULAR | Status: AC
  Administered 2020-12-15: 10 mg via INTRAVENOUS
  Filled 2020-12-15: qty 2

## 2020-12-15 MED ORDER — CARVEDILOL 3.125 MG PO TABS
3.1250 mg | ORAL_TABLET | Freq: Two times a day (BID) | ORAL | Status: DC
  Administered 2020-12-16 (×2): 3.125 mg via ORAL
  Filled 2020-12-15 (×2): qty 1

## 2020-12-15 MED ORDER — INSULIN ASPART 100 UNIT/ML IJ SOLN
0.0000 [IU] | Freq: Three times a day (TID) | INTRAMUSCULAR | Status: DC
  Administered 2020-12-16: 2 [IU] via SUBCUTANEOUS
  Administered 2020-12-16 – 2020-12-18 (×5): 1 [IU] via SUBCUTANEOUS
  Administered 2020-12-18: 2 [IU] via SUBCUTANEOUS
  Administered 2020-12-19 (×3): 1 [IU] via SUBCUTANEOUS
  Administered 2020-12-20: 5 [IU] via SUBCUTANEOUS
  Administered 2020-12-20 (×2): 2 [IU] via SUBCUTANEOUS

## 2020-12-15 MED ORDER — POLYETHYLENE GLYCOL 3350 17 G PO PACK: 17.0000 g | PACK | Freq: Every day | ORAL | Status: DC | PRN

## 2020-12-15 MED ORDER — HEPARIN SODIUM (PORCINE) 5000 UNIT/ML IJ SOLN
5000.0000 [IU] | Freq: Three times a day (TID) | INTRAMUSCULAR | Status: DC
  Administered 2020-12-16 – 2020-12-20 (×14): 5000 [IU] via SUBCUTANEOUS
  Filled 2020-12-15 (×15): qty 1

## 2020-12-15 MED ORDER — DIPHENHYDRAMINE HCL 50 MG/ML IJ SOLN
12.5000 mg | Freq: Once | INTRAMUSCULAR | Status: AC
  Administered 2020-12-15: 12.5 mg via INTRAVENOUS
  Filled 2020-12-15: qty 1

## 2020-12-15 MED ORDER — RISPERIDONE 1 MG PO TABS
1.0000 mg | ORAL_TABLET | Freq: Every day | ORAL | Status: DC
  Administered 2020-12-16 – 2020-12-19 (×4): 1 mg via ORAL
  Filled 2020-12-15 (×6): qty 1

## 2020-12-15 MED ORDER — SERTRALINE HCL 50 MG PO TABS
50.0000 mg | ORAL_TABLET | Freq: Every day | ORAL | Status: DC
  Administered 2020-12-16 – 2020-12-20 (×5): 50 mg via ORAL
  Filled 2020-12-15: qty 2
  Filled 2020-12-15 (×4): qty 1

## 2020-12-15 MED ORDER — METOCLOPRAMIDE HCL 5 MG/ML IJ SOLN: 10.0000 mg | Freq: Four times a day (QID) | INTRAMUSCULAR | Status: DC | PRN

## 2020-12-15 MED ORDER — SODIUM CHLORIDE 0.9 % IV SOLN: INTRAVENOUS | Status: DC

## 2020-12-15 MED ORDER — SODIUM CHLORIDE 0.9% FLUSH
3.0000 mL | Freq: Two times a day (BID) | INTRAVENOUS | Status: DC
  Administered 2020-12-16 – 2020-12-20 (×8): 3 mL via INTRAVENOUS

## 2020-12-15 MED ORDER — HYDRALAZINE HCL 25 MG PO TABS
25.0000 mg | ORAL_TABLET | Freq: Three times a day (TID) | ORAL | Status: DC
  Administered 2020-12-16 (×4): 25 mg via ORAL
  Filled 2020-12-15 (×4): qty 1

## 2020-12-15 MED ORDER — ACETAMINOPHEN 650 MG RE SUPP: 650.0000 mg | Freq: Four times a day (QID) | RECTAL | Status: DC | PRN

## 2020-12-15 MED ORDER — QUETIAPINE FUMARATE 100 MG PO TABS
100.0000 mg | ORAL_TABLET | Freq: Every day | ORAL | Status: DC
  Administered 2020-12-16 – 2020-12-19 (×5): 100 mg via ORAL
  Filled 2020-12-15 (×5): qty 1

## 2020-12-15 MED ORDER — ONDANSETRON HCL 4 MG PO TABS: 4.0000 mg | ORAL_TABLET | Freq: Four times a day (QID) | ORAL | Status: DC | PRN

## 2020-12-15 MED ORDER — CARVEDILOL 3.125 MG PO TABS: 3.1250 mg | ORAL_TABLET | Freq: Two times a day (BID) | ORAL | Status: DC

## 2020-12-15 MED ORDER — NITROGLYCERIN 2 % TD OINT
0.5000 [in_us] | TOPICAL_OINTMENT | Freq: Once | TRANSDERMAL | Status: AC
  Administered 2020-12-15: 0.5 [in_us] via TOPICAL
  Filled 2020-12-15: qty 1

## 2020-12-15 MED ORDER — ACETAMINOPHEN 325 MG PO TABS: 650.0000 mg | ORAL_TABLET | Freq: Four times a day (QID) | ORAL | Status: DC | PRN

## 2020-12-15 MED ORDER — AMLODIPINE BESYLATE 5 MG PO TABS
5.0000 mg | ORAL_TABLET | Freq: Once | ORAL | Status: AC
  Administered 2020-12-15: 5 mg via ORAL
  Filled 2020-12-15: qty 1

## 2020-12-15 MED ORDER — ONDANSETRON HCL 4 MG/2ML IJ SOLN
4.0000 mg | Freq: Once | INTRAMUSCULAR | Status: AC
  Administered 2020-12-15: 4 mg via INTRAVENOUS
  Filled 2020-12-15: qty 2

## 2020-12-15 MED ORDER — ONDANSETRON HCL 4 MG/2ML IJ SOLN
4.0000 mg | Freq: Four times a day (QID) | INTRAMUSCULAR | Status: DC | PRN
  Administered 2020-12-16: 4 mg via INTRAVENOUS
  Filled 2020-12-15: qty 2

## 2020-12-15 MED ORDER — POTASSIUM CHLORIDE 10 MEQ/100ML IV SOLN
10.0000 meq | INTRAVENOUS | Status: AC
  Administered 2020-12-16 (×3): 10 meq via INTRAVENOUS
  Filled 2020-12-15 (×3): qty 100

## 2020-12-15 NOTE — ED Provider Notes (Signed)
[facility] EMERGENCY DEPARTMENT Provider Note   CSN: [id number] Arrival date & time: 12/15/20  1716     History Chief Complaint  Patient presents with   Emesis    Lorraine Turner is a 60 y.o. female.  HPI  60 year old female past medical history of BP, DM, schizophrenia, HTN, HLD presents emergency department with nausea/vomiting.  Patient admittedly has been without her hypertensive medications for the past month, states that she has not been able to fill them.  Over the past couple weeks she has been having intermittent nausea/vomiting.  Nonbloody emesis.  Denies any fever.  She has abdominal cramping with the vomiting but otherwise denies any abdominal or back pain.  Currently she has no chest pain or shortness of breath.  No swelling of her lower extremities.  No headache or neck pain.  Past Medical History:  Diagnosis Date   Anxiety    Bipolar 1 disorder ([place])    Depression    Diabetes mellitus without complication ([place])    Gallstones    Hyperlipidemia    Hypertension    MDD (major depressive disorder), recurrent episode, moderate ([place]) 11/18/2020   Neuropathy    Schizophrenia ([place])    Seizures ([place])    X1- febrile seizure as a child-none since   Suicide ideation 09/12/2018    Patient Active Problem List   Diagnosis Date Noted   UTI (urinary tract infection) 11/24/2020   ARF (acute renal failure) ([place]) 11/24/2020   Diarrhea 11/21/2020   Acute worsening of stage 4 chronic kidney disease ([place])    Hypertensive urgency 09/01/2020   CKD (chronic kidney disease), stage III ([place]) 09/01/2020   DKA (diabetic ketoacidoses) 09/12/2018   AKI (acute kidney injury) ([place]) 09/12/2018   Adjustment disorder with mixed disturbance of emotions and conduct    MDD (major depressive disorder), severe ([place]) 63/78/5885   Umbilical hernia with obstruction 11/24/2015   Symptomatic cholelithiasis 11/24/2015   Type 2 diabetes mellitus with hyperglycemia ([place])    Suicide  attempt ([place]) 05/06/2014   Schizoaffective disorder, depressive type ([place]) 08/14/2011   UNSPECIFIED ANEMIA 01/23/2009   Primary hypertension 10/12/2008   HYPERCHOLESTEROLEMIA 01/01/2007   PANIC DISORDER 01/01/2007   Obsessive compulsive disorder 01/01/2007    Past Surgical History:  Procedure Laterality Date   CESAREAN SECTION  05/1998   X 1   CHOLECYSTECTOMY N/A 03/27/2016   Procedure: LAPAROSCOPIC CHOLECYSTECTOMY;  Surgeon: Olean Ree, MD;  Location: [facility] ORS;  Service: General;  Laterality: N/A;   TONSILLECTOMY AND ADENOIDECTOMY     age 11   TUBAL LIGATION       OB History   No obstetric history on file.     Family History  Problem Relation Age of Onset   Diabetes Mother    Heart disease Mother    Kidney disease Mother    Stroke Mother    Hyperlipidemia Mother    Diabetes Maternal Aunt     Social History   Tobacco Use   Smoking status: Former    Types: Cigarettes    Quit date: 03/14/2013    Years since quitting: 7.7   Smokeless tobacco: Never   Tobacco comments:    1 cigarette1-2 times year, only when she gets stressed  Vaping Use   Vaping Use: Never used  Substance Use Topics   Alcohol use: Yes    Alcohol/week: 1.0 standard drink    Types: 1 Cans of beer per week    Comment: One can of beer every 2-3 months.  Drug use: No    Home Medications Prior to Admission medications   Medication Sig Start Date End Date Taking? Authorizing Provider  amLODipine (NORVASC) 10 MG tablet Take 1 tablet (10 mg total) by mouth daily. 11/26/20   Kathie Dike, MD  Blood Glucose Monitoring Suppl (TRUE METRIX METER) w/Device KIT 1 Device by Does not apply route 3 (three) times daily. For blood sugar checks 09/13/18   Barb Merino, MD  busPIRone (BUSPAR) 5 MG tablet Take 1 tablet (5 mg total) by mouth 2 (two) times daily. 11/26/20   Kathie Dike, MD  carvedilol (COREG) 3.125 MG tablet Take 1 tablet (3.125 mg total) by mouth 2 (two) times daily with a meal. 11/26/20    Kathie Dike, MD  ferrous sulfate 325 (65 FE) MG tablet Take 1 tablet (325 mg total) by mouth daily. 11/26/20 11/26/21  Kathie Dike, MD  gabapentin (NEURONTIN) 100 MG capsule Take 1 capsule (100 mg total) by mouth 3 (three) times daily. 11/26/20 02/24/21  Kathie Dike, MD  glucose blood test strip Use as instructed 10/28/18   Argentina Donovan, PA-C  hydrALAZINE (APRESOLINE) 25 MG tablet Take 1 tablet (25 mg total) by mouth 3 (three) times daily. 11/26/20   Kathie Dike, MD  Insulin Syringe-Needle U-100 (SAFETY INSULIN SYRINGES) 30G X 5/16" 0.5 ML MISC 1 each by Does not apply route at bedtime. 10/21/19   Argentina Donovan, PA-C  ondansetron (ZOFRAN ODT) 4 MG disintegrating tablet Take 1 tablet (4 mg total) by mouth every 8 (eight) hours as needed for nausea or vomiting. 12/07/20   Isla Pence, MD  QUEtiapine (SEROQUEL) 100 MG tablet Take 1 tablet (100 mg total) by mouth at bedtime. For mood control 11/26/20   Kathie Dike, MD  risperiDONE (RISPERDAL) 1 MG tablet Take 1 tablet (1 mg total) by mouth at bedtime. 11/26/20   Kathie Dike, MD  sertraline (ZOLOFT) 50 MG tablet Take 1 tablet (50 mg total) by mouth daily. 11/26/20   Kathie Dike, MD  TRUEplus Lancets 28G MISC 1 each by Does not apply route 3 (three) times daily. For blood sugar checks 09/13/18   Barb Merino, MD  vitamin B-12 (CYANOCOBALAMIN) 1000 MCG tablet Take 1 tablet (1,000 mcg total) by mouth daily. 11/26/20   Kathie Dike, MD    Allergies    Lisinopril and Amoxicillin  Review of Systems   Review of Systems  Constitutional:  Positive for fatigue. Negative for chills and fever.  HENT:  Negative for congestion.   Eyes:  Negative for visual disturbance.  Respiratory:  Negative for shortness of breath.   Cardiovascular:  Negative for chest pain.  Gastrointestinal:  Positive for nausea and vomiting. Negative for abdominal pain and diarrhea.  Genitourinary:  Negative for dysuria.  Musculoskeletal:  Negative  for back pain and neck pain.  Skin:  Negative for rash.  Neurological:  Negative for headaches.   Physical Exam Updated Vital Signs BP (!) 251/90   Pulse 73   Temp 99.2 F (37.3 C) (Oral)   Resp 11   SpO2 100%   Physical Exam Vitals and nursing note reviewed.  Constitutional:      General: She is not in acute distress.    Appearance: Normal appearance. She is not diaphoretic.  HENT:     Head: Normocephalic.     Mouth/Throat:     Mouth: Mucous membranes are moist.     Comments: Poor dentition and missing teeth Eyes:     Pupils: Pupils are equal, round, and reactive  to light.  Cardiovascular:     Rate and Rhythm: Normal rate.  Pulmonary:     Effort: Pulmonary effort is normal. No respiratory distress.  Abdominal:     Palpations: Abdomen is soft.     Tenderness: There is no abdominal tenderness. There is no guarding.  Musculoskeletal:        General: No swelling.  Skin:    General: Skin is warm.  Neurological:     Mental Status: She is alert and oriented to person, place, and time. Mental status is at baseline.  Psychiatric:        Mood and Affect: Mood normal.    ED Results / Procedures / Treatments   Labs (all labs ordered are listed, but only abnormal results are displayed) Labs Reviewed  CBC WITH DIFFERENTIAL/PLATELET  COMPREHENSIVE METABOLIC PANEL  LIPASE, BLOOD  URINALYSIS, ROUTINE W REFLEX MICROSCOPIC    EKG None  Radiology No results found.  Procedures Procedures   Medications Ordered in ED Medications  famotidine (PEPCID) IVPB 20 mg premix (20 mg Intravenous New Bag/Given 12/15/20 1820)  0.9 %  sodium chloride infusion ( Intravenous New Bag/Given 12/15/20 1820)  ondansetron (ZOFRAN) injection 4 mg (4 mg Intravenous Given 12/15/20 1820)  hydrALAZINE (APRESOLINE) injection 10 mg (10 mg Intravenous Given 12/15/20 1820)    ED Course  I have reviewed the triage vital signs and the nursing notes.  Pertinent labs & imaging results that were available  during my care of the patient were reviewed by me and considered in my medical decision making (see chart for details).    MDM Rules/Calculators/A&P                           60 year old female presents emergency department nausea/vomiting and noncompliance with high blood pressure medications.  She is hypertensive on arrival.  Denies any chest pain, back pain, headache or neck pain.  She states that she has not had a bowel movement in many days and has had profuse nausea/vomiting.  Abdomen is soft, nondistended.  Blood work shows baseline AKI, mild hypokalemia and pseudohyponatremia with glucose of 282.  White blood cell count of 11, lipase is normal.  Patient has not been able to give [place] a urine sample yet.  CT of the abdomen pelvis without contrast due to low GFR shows no acute abdominal abnormality/obstruction.  After 2 rounds medications her vomiting has subsided but she is still not tolerated oral medications.  Blood pressure was difficult to control and required Nitropaste.  Patient feels improved but is still not tolerating p.o. and needs better blood pressure control.  Plan for admission for hypertensive urgency.  Again no acute chest pain/headache.  Low suspicion for ACS/ICH.  Patients evaluation and results requires admission for further treatment and care. Patient agrees with admission plan, offers no new complaints and is stable/unchanged at time of admit.  Final Clinical Impression(s) / ED Diagnoses Final diagnoses:  None    Rx / DC Orders ED Discharge Orders     None        Lorelle Gibbs, DO 12/15/20 2259

## 2020-12-15 NOTE — ED Notes (Signed)
Patient transported to CT 

## 2020-12-15 NOTE — ED Notes (Signed)
Pt stated that she has not had a bowel movement in a few months. Abdomen is nondistended. EDP made aware.

## 2020-12-15 NOTE — ED Triage Notes (Addendum)
Pt BIB [facility] from home, c/o nausea/vomiting x 2 weeks, has been seen for same. Pt reports shortness of breath that started 2 days ago, denies chest pain. Hypertensive, hx of same, pt has been unable to get and take her prescribed medications. Given 4mg  zofran pta.

## 2020-12-15 NOTE — ED Notes (Signed)
Pt complained of slight burning at IV site after RN gave Benadryl. Reglan held until IV team can put in a second line.

## 2020-12-15 NOTE — H&P (Signed)
History and Physical   Lorraine Turner LNL:[id number] DOB: 03-11-1960 DOA: 12/15/2020  PCP: [name], MD   Patient coming from: Home  Chief Complaint: Nausea and vomiting  HPI: Lorraine Turner is a 60 y.o. female with medical history significant of CKD 4, chest disorder, depression, anxiety, OCD, schizoaffective, bipolar, history of suicidal attempt, hypertension, diabetes, hyperlipidemia, anemia presenting with ongoing nausea and vomiting.  Patient states that for around the past month she has been out of her blood pressure medicines leading her to have persistently high blood pressure.  States she has been unable to get her refills due to transportation issues.  She states that for the past 2 weeks she is also had ongoing nausea and vomiting that has been nonbloody in nature.    She also reports that in the last couple of days she has noticed some intermittent shortness of breath but no chest pain.  She currently denies any chest pain nor shortness of breath.  She has some cramping with her episodes of vomiting but otherwise no abdominal pain. She denies fevers or chills as well.  ED Course: Vital signs in the ED significant for blood pressure in the 408X systolic initially ranging of close the 448J systolic.  Lab work-up showed CMP with sodium 132 which corrects 135 considering glucose of 282, potassium 3.3, chloride 93, BUN 41, creatinine elevated to 3.85 from baseline around 3.5.  Albumin 3.3.  CBC with leukocytosis to 11.  Lipase normal.  Respiratory panel for flu and COVID pending.  Urinalysis pending.  CT abdomen pelvis without contrast showed no acute normality.  Patient received amlodipine, carvedilol, hydralazine, Nitropaste, Pepcid, Reglan, Zofran, Benadryl in the ED.  Review of Systems: As per HPI otherwise all other systems reviewed and are negative.  Past Medical History:  Diagnosis Date   AKI (acute kidney injury) ([place]) 09/12/2018   Anxiety    Bipolar 1 disorder ([place])     Depression    Diabetes mellitus without complication ([place])    Diarrhea 11/21/2020   Gallstones    Hyperlipidemia    Hypertension    MDD (major depressive disorder), recurrent episode, moderate ([place]) 11/18/2020   Neuropathy    Schizophrenia ([place])    Seizures (HCC)    X1- febrile seizure as a child-none since   Suicide ideation 09/12/2018    Past Surgical History:  Procedure Laterality Date   CESAREAN SECTION  05/1998   X 1   CHOLECYSTECTOMY N/A 03/27/2016   Procedure: LAPAROSCOPIC CHOLECYSTECTOMY;  Surgeon: Olean Ree, MD;  Location: [facility] ORS;  Service: General;  Laterality: N/A;   TONSILLECTOMY AND ADENOIDECTOMY     age 75   TUBAL LIGATION      Social History  reports that she quit smoking about 7 years ago. Her smoking use included cigarettes. She has never used smokeless tobacco. She reports current alcohol use of about 1.0 standard drink per week. She reports that she does not use drugs.  Allergies  Allergen Reactions   Lisinopril Other (See Comments)    Patient does not wish to take this medication (she heard that it can make your kidneys worse)   Amoxicillin Rash    Family History  Problem Relation Age of Onset   Diabetes Mother    Heart disease Mother    Kidney disease Mother    Stroke Mother    Hyperlipidemia Mother    Diabetes Maternal Aunt   Reviewed on admission  Prior to Admission medications   Medication Sig Start Date End  Date Taking? Authorizing Provider  amLODipine (NORVASC) 10 MG tablet Take 1 tablet (10 mg total) by mouth daily. 11/26/20   Kathie Dike, MD  Blood Glucose Monitoring Suppl (TRUE METRIX METER) w/Device KIT 1 Device by Does not apply route 3 (three) times daily. For blood sugar checks 09/13/18   Barb Merino, MD  busPIRone (BUSPAR) 5 MG tablet Take 1 tablet (5 mg total) by mouth 2 (two) times daily. 11/26/20   Kathie Dike, MD  carvedilol (COREG) 3.125 MG tablet Take 1 tablet (3.125 mg total) by mouth 2 (two) times daily with a  meal. 11/26/20   Kathie Dike, MD  ferrous sulfate 325 (65 FE) MG tablet Take 1 tablet (325 mg total) by mouth daily. 11/26/20 11/26/21  Kathie Dike, MD  gabapentin (NEURONTIN) 100 MG capsule Take 1 capsule (100 mg total) by mouth 3 (three) times daily. 11/26/20 02/24/21  Kathie Dike, MD  glucose blood test strip Use as instructed 10/28/18   Argentina Donovan, PA-C  hydrALAZINE (APRESOLINE) 25 MG tablet Take 1 tablet (25 mg total) by mouth 3 (three) times daily. 11/26/20   Kathie Dike, MD  Insulin Syringe-Needle U-100 (SAFETY INSULIN SYRINGES) 30G X 5/16" 0.5 ML MISC 1 each by Does not apply route at bedtime. 10/21/19   Argentina Donovan, PA-C  ondansetron (ZOFRAN ODT) 4 MG disintegrating tablet Take 1 tablet (4 mg total) by mouth every 8 (eight) hours as needed for nausea or vomiting. 12/07/20   Isla Pence, MD  QUEtiapine (SEROQUEL) 100 MG tablet Take 1 tablet (100 mg total) by mouth at bedtime. For mood control 11/26/20   Kathie Dike, MD  risperiDONE (RISPERDAL) 1 MG tablet Take 1 tablet (1 mg total) by mouth at bedtime. 11/26/20   Kathie Dike, MD  sertraline (ZOLOFT) 50 MG tablet Take 1 tablet (50 mg total) by mouth daily. 11/26/20   Kathie Dike, MD  TRUEplus Lancets 28G MISC 1 each by Does not apply route 3 (three) times daily. For blood sugar checks 09/13/18   Barb Merino, MD  vitamin B-12 (CYANOCOBALAMIN) 1000 MCG tablet Take 1 tablet (1,000 mcg total) by mouth daily. 11/26/20   Kathie Dike, MD    Physical Exam: Vitals:   12/15/20 2230 12/15/20 2258 12/15/20 2300 12/15/20 2315  BP: (!) 195/85 (!) 194/81 (!) 197/79 (!) 199/79  Pulse: 75  82 72  Resp: '13  17 13  ' Temp:      TempSrc:      SpO2: 99%  100% 97%   Physical Exam Constitutional:      General: She is not in acute distress.    Appearance: Normal appearance.  HENT:     Head: Normocephalic and atraumatic.     Mouth/Throat:     Mouth: Mucous membranes are moist.     Dentition: Abnormal  dentition. Gum lesions present.     Pharynx: Oropharynx is clear.      Comments: Lesion in the upper right gumline, appear to be base where tooth was previously, no obvious abscess or purulence Eyes:     Extraocular Movements: Extraocular movements intact.     Pupils: Pupils are equal, round, and reactive to light.  Cardiovascular:     Rate and Rhythm: Normal rate and regular rhythm.     Pulses: Normal pulses.     Heart sounds: Murmur heard.  Pulmonary:     Effort: Pulmonary effort is normal. No respiratory distress.     Breath sounds: Normal breath sounds.  Abdominal:     General:  Bowel sounds are normal. There is no distension.     Palpations: Abdomen is soft.     Tenderness: There is no abdominal tenderness.  Musculoskeletal:        General: No swelling or deformity.  Skin:    General: Skin is warm and dry.  Neurological:     General: No focal deficit present.     Mental Status: Mental status is at baseline.   Labs on Admission: I have personally reviewed following labs and imaging studies  CBC: Recent Labs  Lab 12/09/20 0533 12/15/20 1805  WBC 13.2* 11.0*  NEUTROABS  --  8.5*  HGB 12.5 12.0  HCT 36.4 34.1*  MCV 88.8 84.8  PLT 314 051    Basic Metabolic Panel: Recent Labs  Lab 12/09/20 0533 12/15/20 1805  NA 134* 132*  K 4.0 3.3*  CL 100 93*  CO2 23 27  GLUCOSE 237* 282*  BUN 60* 41*  CREATININE 3.75* 3.85*  CALCIUM 9.0 8.9    GFR: Estimated Creatinine Clearance: 14.4 mL/min (A) (by C-G formula based on SCr of 3.85 mg/dL (H)).  Liver Function Tests: Recent Labs  Lab 12/09/20 0533 12/15/20 1805  AST 17 15  ALT 16 14  ALKPHOS 100 87  BILITOT 1.0 1.2  PROT 7.8 7.3  ALBUMIN 3.6 3.3*    Urine analysis:    Component Value Date/Time   COLORURINE YELLOW 12/09/2020 0533   APPEARANCEUR CLOUDY (A) 12/09/2020 0533   LABSPEC 1.014 12/09/2020 0533   PHURINE 7.0 12/09/2020 0533   GLUCOSEU 150 (A) 12/09/2020 0533   HGBUR SMALL (A) 12/09/2020 0533    HGBUR negative 10/12/2008 1103   BILIRUBINUR NEGATIVE 12/09/2020 0533   BILIRUBINUR negative 10/21/2019 1447   BILIRUBINUR negative 03/10/2017 1459   KETONESUR 5 (A) 12/09/2020 0533   PROTEINUR >=300 (A) 12/09/2020 0533   UROBILINOGEN 0.2 10/21/2019 1447   UROBILINOGEN 0.2 09/30/2019 1151   NITRITE NEGATIVE 12/09/2020 0533   LEUKOCYTESUR NEGATIVE 12/09/2020 0533    Radiological Exams on Admission: CT Abdomen Pelvis Wo Contrast  Result Date: 12/15/2020 CLINICAL DATA:  Acute abdominal pain EXAM: CT ABDOMEN AND PELVIS WITHOUT CONTRAST TECHNIQUE: Multidetector CT imaging of the abdomen and pelvis was performed following the standard protocol without IV contrast. COMPARISON:  12/09/2020 FINDINGS: Lower chest: No acute abnormality. Hepatobiliary: No focal liver abnormality is seen. Status post cholecystectomy. No biliary dilatation. Pancreas: Diffuse fatty infiltration of the pancreas is noted. Spleen: Normal in size without focal abnormality. Adrenals/Urinary Tract: Adrenal glands are within normal limits. Kidneys show no renal calculi or obstructive changes. Bladder is again well distended similar to that seen on the prior exam. Stomach/Bowel: No obstructive or inflammatory changes of the colon are seen. The appendix is well visualized and within normal limits. No inflammatory changes are seen. Small bowel and stomach are within normal limits. Vascular/Lymphatic: Aortic atherosclerosis. No enlarged abdominal or pelvic lymph nodes. Reproductive: Uterus and bilateral adnexa are unremarkable. Other: No abdominal wall hernia or abnormality. No abdominopelvic ascites. Musculoskeletal: Degenerative changes of the thoracolumbar spine are noted. No compression deformities are noted. IMPRESSION: No acute abnormality is identified. No significant interval change from the prior exam. Electronically Signed   By: Inez Catalina M.D.   On: 12/15/2020 21:15    EKG: Independently reviewed.  Sinus rhythm at 72 bpm.   Nonspecific T wave flattening.   Assessment/Plan Principal Problem:   Intractable nausea and vomiting Active Problems:   Obsessive compulsive disorder   Schizoaffective disorder, depressive type (HCC)   Type  2 diabetes mellitus with hyperglycemia (HCC)   MDD (major depressive disorder), severe (HCC)   Adjustment disorder with mixed disturbance of emotions and conduct   Hypertensive urgency   Acute renal failure superimposed on stage 4 chronic kidney disease (HCC)   Hypokalemia  Intractable nausea and vomiting > Presenting with 2 weeks of intractable nausea and vomiting. Has the chronically and flairs up from time to time. > Etiology unclear but could be related to hypertensive urgency/emergency > Does have mild leukocytosis to 11 however this could be reactive given her ongoing symptoms.  No obvious abnormalities on CT abdomen pelvis without contrast. > Some improvement in nausea with antiemetics in the ED but is unable to tolerate her home p.o. medications. - Continue with as needed antiemetics - When tolerating p.o. resume home medications - Trend fever curve and white count  Hypertensive urgency/emergency > History of hypertension and prior hypertensive urgency/emergency. > Out of medication for past month > Blood pressure elevated side to 220s in the ED improved to the 702O to 378H systolic at times. > Still currently unable to tolerate p.o. medications due to above.  Will need IV medications to be provide better control of blood pressure in case some of her symptoms are related to hypertensive emergency. > Goal blood pressure less than 180 consider she came in at 885 systolic. - Cardiac monitoring - As needed labetalol for blood pressure greater than 027 systolic - Check troponin - Resume home amlodipine, Coreg, hydralazine when able  AKI on CKD 4 Hypokalemia > Creatinine elevated to 3.85 from baseline of around 3.5. > Potassium noted to be 3.3 in ED. > With ongoing nausea  vomiting this is a likely etiology. - Gentle IV fluids - Avoid nephrotoxic agents - 30 mEq IV potassium - Check magnesium - Trend renal function and electrolytes  Dental pain > Reports some pain in her gums in the upper right.  No obvious abscess or purulence.  - Continue to monitor, can consider pain medication if this becomes intolerable, however she did not mention it until asked so likely not too significant.  Diabetes - SSI   Depression Anxiety History of OCD Schizoaffective disorder History of bipolar > Also history of suicide attempt earlier this ear > Denies SI - Continue home sertraline, quetiapine, risperidone, BuSpar when tolerating p.o.  DVT prophylaxis: Heparin Code Status:   Full  Family Communication:  None on admission.  Patient states that she does not require any family to be updated at this time. Disposition Plan:   Patient is from:  Home  Anticipated DC to:  Home  Anticipated DC date:  1 to 2 days  Anticipated DC barriers: None  Consults called:  None  Admission status:  Observation, telemetry   Severity of Illness: The appropriate patient status for this patient is OBSERVATION. Observation status is judged to be reasonable and necessary in order to provide the required intensity of service to ensure the patient's safety. The patient's presenting symptoms, physical exam findings, and initial radiographic and laboratory data in the context of their medical condition is felt to place them at decreased risk for further clinical deterioration. Furthermore, it is anticipated that the patient will be medically stable for discharge from the hospital within 2 midnights of admission.    Marcelyn Bruins MD [facility]  How to contact the [facility] Attending or Consulting provider Flasher or covering provider during after hours [place], for this patient?   Check the care team in [facility]  and look for a) attending/consulting [place] provider listed and b) the [facility] team  listed Log into [email or web address] and use 's universal password to access. If you do not have the password, please contact the hospital operator. Locate the [facility] provider you are looking for under [facility] and page to a number that you can be directly reached. If you still have difficulty reaching the provider, please page the [facility] (Director on Call) for the Hospitalists listed on amion for assistance.  12/15/2020, 11:52 PM

## 2020-12-15 NOTE — ED Notes (Signed)
Pt continuing to experience episodes of emesis. EDP made aware.

## 2020-12-16 ENCOUNTER — Encounter (HOSPITAL_COMMUNITY): Payer: Self-pay | Admitting: Internal Medicine

## 2020-12-16 DIAGNOSIS — Z9049 Acquired absence of other specified parts of digestive tract: Secondary | ICD-10-CM | POA: Diagnosis not present

## 2020-12-16 DIAGNOSIS — E7800 Pure hypercholesterolemia, unspecified: Secondary | ICD-10-CM | POA: Diagnosis present

## 2020-12-16 DIAGNOSIS — Z833 Family history of diabetes mellitus: Secondary | ICD-10-CM | POA: Diagnosis not present

## 2020-12-16 DIAGNOSIS — K0889 Other specified disorders of teeth and supporting structures: Secondary | ICD-10-CM | POA: Diagnosis present

## 2020-12-16 DIAGNOSIS — F429 Obsessive-compulsive disorder, unspecified: Secondary | ICD-10-CM | POA: Diagnosis present

## 2020-12-16 DIAGNOSIS — F4325 Adjustment disorder with mixed disturbance of emotions and conduct: Secondary | ICD-10-CM | POA: Diagnosis present

## 2020-12-16 DIAGNOSIS — Z9151 Personal history of suicidal behavior: Secondary | ICD-10-CM | POA: Diagnosis not present

## 2020-12-16 DIAGNOSIS — E871 Hypo-osmolality and hyponatremia: Secondary | ICD-10-CM | POA: Diagnosis present

## 2020-12-16 DIAGNOSIS — Z841 Family history of disorders of kidney and ureter: Secondary | ICD-10-CM | POA: Diagnosis not present

## 2020-12-16 DIAGNOSIS — Z9851 Tubal ligation status: Secondary | ICD-10-CM | POA: Diagnosis not present

## 2020-12-16 DIAGNOSIS — F419 Anxiety disorder, unspecified: Secondary | ICD-10-CM | POA: Diagnosis present

## 2020-12-16 DIAGNOSIS — Z20822 Contact with and (suspected) exposure to covid-19: Secondary | ICD-10-CM | POA: Diagnosis present

## 2020-12-16 DIAGNOSIS — R112 Nausea with vomiting, unspecified: Secondary | ICD-10-CM | POA: Diagnosis present

## 2020-12-16 DIAGNOSIS — N184 Chronic kidney disease, stage 4 (severe): Secondary | ICD-10-CM | POA: Diagnosis present

## 2020-12-16 DIAGNOSIS — F319 Bipolar disorder, unspecified: Secondary | ICD-10-CM | POA: Diagnosis present

## 2020-12-16 DIAGNOSIS — I160 Hypertensive urgency: Secondary | ICD-10-CM | POA: Diagnosis present

## 2020-12-16 DIAGNOSIS — F251 Schizoaffective disorder, depressive type: Secondary | ICD-10-CM | POA: Diagnosis present

## 2020-12-16 DIAGNOSIS — E1165 Type 2 diabetes mellitus with hyperglycemia: Secondary | ICD-10-CM | POA: Diagnosis present

## 2020-12-16 DIAGNOSIS — Z87891 Personal history of nicotine dependence: Secondary | ICD-10-CM | POA: Diagnosis not present

## 2020-12-16 DIAGNOSIS — E785 Hyperlipidemia, unspecified: Secondary | ICD-10-CM | POA: Diagnosis present

## 2020-12-16 DIAGNOSIS — Z83438 Family history of other disorder of lipoprotein metabolism and other lipidemia: Secondary | ICD-10-CM | POA: Diagnosis not present

## 2020-12-16 DIAGNOSIS — I129 Hypertensive chronic kidney disease with stage 1 through stage 4 chronic kidney disease, or unspecified chronic kidney disease: Secondary | ICD-10-CM | POA: Diagnosis present

## 2020-12-16 DIAGNOSIS — Z8249 Family history of ischemic heart disease and other diseases of the circulatory system: Secondary | ICD-10-CM | POA: Diagnosis not present

## 2020-12-16 DIAGNOSIS — E876 Hypokalemia: Secondary | ICD-10-CM | POA: Diagnosis present

## 2020-12-16 LAB — CBC
HCT: 29.8 % — ABNORMAL LOW (ref 36.0–46.0)
Hemoglobin: 10.3 g/dL — ABNORMAL LOW (ref 12.0–15.0)
MCH: 30.1 pg (ref 26.0–34.0)
MCHC: 34.6 g/dL (ref 30.0–36.0)
MCV: 87.1 fL (ref 80.0–100.0)
Platelets: 304 10*3/uL (ref 150–400)
RBC: 3.42 MIL/uL — ABNORMAL LOW (ref 3.87–5.11)
RDW: 12.8 % (ref 11.5–15.5)
WBC: 10.7 10*3/uL — ABNORMAL HIGH (ref 4.0–10.5)
nRBC: 0 % (ref 0.0–0.2)

## 2020-12-16 LAB — GLUCOSE, CAPILLARY
Glucose-Capillary: 188 mg/dL — ABNORMAL HIGH (ref 70–99)
Glucose-Capillary: 87 mg/dL (ref 70–99)
Glucose-Capillary: 121 mg/dL — ABNORMAL HIGH (ref 70–99)
Glucose-Capillary: 124 mg/dL — ABNORMAL HIGH (ref 70–99)

## 2020-12-16 LAB — TROPONIN I (HIGH SENSITIVITY): Troponin I (High Sensitivity): 19 ng/L — ABNORMAL HIGH (ref <18)

## 2020-12-16 LAB — COMPREHENSIVE METABOLIC PANEL
ALT: 11 U/L (ref 0–44)
AST: 13 U/L — ABNORMAL LOW (ref 15–41)
Albumin: 2.7 g/dL — ABNORMAL LOW (ref 3.5–5.0)
Alkaline Phosphatase: 71 U/L (ref 38–126)
Anion gap: 9 (ref 5–15)
BUN: 40 mg/dL — ABNORMAL HIGH (ref 6–20)
CO2: 27 mmol/L (ref 22–32)
Calcium: 8.2 mg/dL — ABNORMAL LOW (ref 8.9–10.3)
Chloride: 98 mmol/L (ref 98–111)
Creatinine, Ser: 3.83 mg/dL — ABNORMAL HIGH (ref 0.44–1.00)
GFR, Estimated: 13 mL/min — ABNORMAL LOW (ref >60)
Glucose, Bld: 214 mg/dL — ABNORMAL HIGH (ref 70–99)
Potassium: 3.5 mmol/L (ref 3.5–5.1)
Sodium: 134 mmol/L — ABNORMAL LOW (ref 135–145)
Total Bilirubin: 1.1 mg/dL (ref 0.3–1.2)
Total Protein: 6 g/dL — ABNORMAL LOW (ref 6.5–8.1)

## 2020-12-16 LAB — MAGNESIUM: Magnesium: 2.4 mg/dL (ref 1.7–2.4)

## 2020-12-16 NOTE — Plan of Care (Signed)

## 2020-12-16 NOTE — Progress Notes (Signed)
PROGRESS NOTE  Lorraine Turner KZL:[id number] DOB: August 31, 1960 DOA: 12/15/2020 PCP: [name], MD  HPI/Recap of past 24 hours: Lorraine Turner is a 60 y.o. female with medical history significant of CKD 4, chronic anxiety/depression, OCD, schizoaffective, bipolar, history of suicidal attempt, hypertension, type 2 diabetes, hyperlipidemia, chronic normocytic anemia, who presented to [facility] ED with complaints of intractable nausea and vomiting.  Work-up in the ED reveals hypertensive emergency with SBP in the 220s, she had run out of her home medication for 1 month.  Associated with elevated creatinine at least 0.3 points from baseline.  She was started on IV fluid hydration and on her home oral antihypertensives.  12/16/2020: Patient was seen and examined at bedside.  She is somnolent but easily arousable to voices.  Minimally interactive.  She denies having any abdominal pain.   Assessment/Plan: Principal Problem:   Intractable nausea and vomiting Active Problems:   Obsessive compulsive disorder   Schizoaffective disorder, depressive type (HCC)   Type 2 diabetes mellitus with hyperglycemia (HCC)   MDD (major depressive disorder), severe (HCC)   Adjustment disorder with mixed disturbance of emotions and conduct   Hypertensive urgency   Acute renal failure superimposed on stage 4 chronic kidney disease (HCC)   Hypokalemia  Intractable nausea and vomiting, unclear etiology Continue IV fluid hydration Patient has declined breakfast and lunch Continue to encourage to eat as able IV antiemetics as needed  Hypertensive emergency Presented with SBP greater than 220, AKI BP improved but not at goal Restarted home oral antihypertensive Continue to closely monitor vital signs  AKI on CKD 4 Baseline creatinine appears to be 3.3 with GFR 15 Presented with creatinine of 3.85 with GFR of 13. Avoid nephrotoxic agent, dehydration and hypotension Closely monitor urine output with strict  I's and O's Repeat renal panel in the morning.  Type 2 diabetes with hyperglycemia Hemoglobin A1c 8.3 on 11/19/2020. Continue insulin sliding scale.  Mild hypovolemic hyponatremia Serum sodium 134 Continue normal saline at 75 cc/h Encouraged to eat as able Repeat BMP in the morning.  Elevated troponin, suspect demand ischemia Troponin 19 12 EKG nonspecific ST-T changes. Patient denies any anginal symptoms. Monitor on telemetry for now.    Code Status: Full code  Family Communication: None at bedside  Disposition Plan: Likely will discharge to home once she is able to tolerate a diet and renal function has improved   Consultants: None  Procedures: None  Antimicrobials: None  DVT prophylaxis: Subcu Heparin 5000 unit  Status is: Inpatient  Inpatient status.  Patient will require least 2 midnights for further evaluation and treatment of present condition.      Objective: Vitals:   12/16/20 0106 12/16/20 0125 12/16/20 0807 12/16/20 1227  BP:  (!) 172/64 (!) 149/58 (!) 130/57  Pulse:  72 74 66  Resp:  16 18 18   Temp: 99.7 F (37.6 C) 99 F (37.2 C) 98.9 F (37.2 C) 98.9 F (37.2 C)  TempSrc: Axillary Oral  Oral  SpO2:  100% 99% 98%  Weight:  56.5 kg    Height:  5\' 2"  (1.575 m)      Intake/Output Summary (Last 24 hours) at 12/16/2020 1516 Last data filed at 12/16/2020 1000 Gross per 24 hour  Intake 737.71 ml  Output 0 ml  Net 737.71 ml   Filed Weights   12/16/20 0125  Weight: 56.5 kg    Exam:  General: 60 y.o. year-old female well developed well nourished in no acute distress.  Somnolent but is  arousable to voices. Cardiovascular: Regular rate and rhythm with no rubs or gallops.  No thyromegaly or JVD noted.   Respiratory: Clear to auscultation with no wheezes or rales. Good inspiratory effort. Abdomen: Soft nontender nondistended with normal bowel sounds x4 quadrants. Musculoskeletal: No lower extremity edema. 2/4 pulses in all 4  extremities. Skin: No ulcerative lesions noted or rashes, Psychiatry: Mood is appropriate for condition and setting   Data Reviewed: CBC: Recent Labs  Lab 12/15/20 1805 12/16/20 0450  WBC 11.0* 10.7*  NEUTROABS 8.5*  --   HGB 12.0 10.3*  HCT 34.1* 29.8*  MCV 84.8 87.1  PLT 379 712   Basic Metabolic Panel: Recent Labs  Lab 12/15/20 1805 12/16/20 0042 12/16/20 0450  NA 132*  --  134*  K 3.3*  --  3.5  CL 93*  --  98  CO2 27  --  27  GLUCOSE 282*  --  214*  BUN 41*  --  40*  CREATININE 3.85*  --  3.83*  CALCIUM 8.9  --  8.2*  MG  --  2.4  --    GFR: Estimated Creatinine Clearance: 12.5 mL/min (A) (by C-G formula based on SCr of 3.83 mg/dL (H)). Liver Function Tests: Recent Labs  Lab 12/15/20 1805 12/16/20 0450  AST 15 13*  ALT 14 11  ALKPHOS 87 71  BILITOT 1.2 1.1  PROT 7.3 6.0*  ALBUMIN 3.3* 2.7*   Recent Labs  Lab 12/15/20 1805  LIPASE 31   No results for input(s): AMMONIA in the last 168 hours. Coagulation Profile: No results for input(s): INR, PROTIME in the last 168 hours. Cardiac Enzymes: No results for input(s): CKTOTAL, CKMB, CKMBINDEX, TROPONINI in the last 168 hours. BNP (last 3 results) No results for input(s): PROBNP in the last 8760 hours. HbA1C: No results for input(s): HGBA1C in the last 72 hours. CBG: Recent Labs  Lab 12/16/20 0809 12/16/20 1224  GLUCAP 188* 87   Lipid Profile: No results for input(s): CHOL, HDL, LDLCALC, TRIG, CHOLHDL, LDLDIRECT in the last 72 hours. Thyroid Function Tests: No results for input(s): TSH, T4TOTAL, FREET4, T3FREE, THYROIDAB in the last 72 hours. Anemia Panel: No results for input(s): VITAMINB12, FOLATE, FERRITIN, TIBC, IRON, RETICCTPCT in the last 72 hours. Urine analysis:    Component Value Date/Time   COLORURINE YELLOW 12/09/2020 0533   APPEARANCEUR CLOUDY (A) 12/09/2020 0533   LABSPEC 1.014 12/09/2020 0533   PHURINE 7.0 12/09/2020 0533   GLUCOSEU 150 (A) 12/09/2020 0533   HGBUR SMALL (A)  12/09/2020 0533   HGBUR negative 10/12/2008 1103   BILIRUBINUR NEGATIVE 12/09/2020 0533   BILIRUBINUR negative 10/21/2019 1447   BILIRUBINUR negative 03/10/2017 1459   KETONESUR 5 (A) 12/09/2020 0533   PROTEINUR >=300 (A) 12/09/2020 0533   UROBILINOGEN 0.2 10/21/2019 1447   UROBILINOGEN 0.2 09/30/2019 1151   NITRITE NEGATIVE 12/09/2020 0533   LEUKOCYTESUR NEGATIVE 12/09/2020 0533   Sepsis Labs: @LABRCNTIP (procalcitonin:4,lacticidven:4)  ) Recent Results (from the past 240 hour(s))  Resp Panel by RT-PCR (Flu A&B, Covid) Nasopharyngeal Swab     Status: None   Collection Time: 12/15/20 10:53 PM   Specimen: Nasopharyngeal Swab; Nasopharyngeal(NP) swabs in vial transport medium  Result Value Ref Range Status   SARS Coronavirus 2 by RT PCR NEGATIVE NEGATIVE Final    Comment: (NOTE) SARS-CoV-2 target nucleic acids are NOT DETECTED.  The SARS-CoV-2 RNA is generally detectable in upper respiratory specimens during the acute phase of infection. The lowest concentration of SARS-CoV-2 viral copies this assay can detect  is 138 copies/mL. A negative result does not preclude SARS-Cov-2 infection and should not be used as the sole basis for treatment or other patient management decisions. A negative result may occur with  improper specimen collection/handling, submission of specimen other than nasopharyngeal swab, presence of viral mutation(s) within the areas targeted by this assay, and inadequate number of viral copies(<138 copies/mL). A negative result must be combined with clinical observations, patient history, and epidemiological information. The expected result is Negative.  Fact Sheet for Patients:  EntrepreneurPulse.com.au  Fact Sheet for Healthcare Providers:  IncredibleEmployment.be  This test is no t yet approved or cleared by the [place] FDA and  has been authorized for detection and/or diagnosis of SARS-CoV-2 by FDA under an  Emergency Use Authorization (EUA). This EUA will remain  in effect (meaning this test can be used) for the duration of the COVID-19 declaration under Section 564(b)(1) of the Act, 21 U.S.C.section 360bbb-3(b)(1), unless the authorization is terminated  or revoked sooner.       Influenza A by PCR NEGATIVE NEGATIVE Final   Influenza B by PCR NEGATIVE NEGATIVE Final    Comment: (NOTE) The Xpert Xpress SARS-CoV-2/FLU/RSV plus assay is intended as an aid in the diagnosis of influenza from Nasopharyngeal swab specimens and should not be used as a sole basis for treatment. Nasal washings and aspirates are unacceptable for Xpert Xpress SARS-CoV-2/FLU/RSV testing.  Fact Sheet for Patients: EntrepreneurPulse.com.au  Fact Sheet for Healthcare Providers: IncredibleEmployment.be  This test is not yet approved or cleared by the [place] FDA and has been authorized for detection and/or diagnosis of SARS-CoV-2 by FDA under an Emergency Use Authorization (EUA). This EUA will remain in effect (meaning this test can be used) for the duration of the COVID-19 declaration under Section 564(b)(1) of the Act, 21 U.S.C. section 360bbb-3(b)(1), unless the authorization is terminated or revoked.  Performed at [facility], [place] [address], [place], [place] [address]       Studies: CT Abdomen Pelvis Wo Contrast  Result Date: 12/15/2020 CLINICAL DATA:  Acute abdominal pain EXAM: CT ABDOMEN AND PELVIS WITHOUT CONTRAST TECHNIQUE: Multidetector CT imaging of the abdomen and pelvis was performed following the standard protocol without IV contrast. COMPARISON:  12/09/2020 FINDINGS: Lower chest: No acute abnormality. Hepatobiliary: No focal liver abnormality is seen. Status post cholecystectomy. No biliary dilatation. Pancreas: Diffuse fatty infiltration of the pancreas is noted. Spleen: Normal in size without focal abnormality. Adrenals/Urinary Tract: Adrenal glands  are within normal limits. Kidneys show no renal calculi or obstructive changes. Bladder is again well distended similar to that seen on the prior exam. Stomach/Bowel: No obstructive or inflammatory changes of the colon are seen. The appendix is well visualized and within normal limits. No inflammatory changes are seen. Small bowel and stomach are within normal limits. Vascular/Lymphatic: Aortic atherosclerosis. No enlarged abdominal or pelvic lymph nodes. Reproductive: Uterus and bilateral adnexa are unremarkable. Other: No abdominal wall hernia or abnormality. No abdominopelvic ascites. Musculoskeletal: Degenerative changes of the thoracolumbar spine are noted. No compression deformities are noted. IMPRESSION: No acute abnormality is identified. No significant interval change from the prior exam. Electronically Signed   By: Inez Catalina M.D.   On: 12/15/2020 21:15    Scheduled Meds:  amLODipine  10 mg Oral Daily   busPIRone  5 mg Oral BID   carvedilol  3.125 mg Oral BID WC   heparin  5,000 Units Subcutaneous Q8H   hydrALAZINE  25 mg Oral TID   insulin aspart  0-9 Units Subcutaneous TID WC   QUEtiapine  100 mg Oral QHS   risperiDONE  1 mg Oral QHS   sertraline  50 mg Oral Daily   sodium chloride flush  3 mL Intravenous Q12H    Continuous Infusions:  sodium chloride 75 mL/hr at 12/15/20 1820     LOS: 0 days     Kayleen Memos, MD [facility] Pager [phone]  If 7PM-7AM, please contact night-coverage [email or web address] Password [facility] 12/16/2020, 3:16 PM

## 2020-12-17 DIAGNOSIS — R112 Nausea with vomiting, unspecified: Secondary | ICD-10-CM | POA: Diagnosis not present

## 2020-12-17 DIAGNOSIS — Z5900 Homelessness unspecified: Secondary | ICD-10-CM

## 2020-12-17 DIAGNOSIS — Z91199 Patient's noncompliance with other medical treatment and regimen due to unspecified reason: Secondary | ICD-10-CM

## 2020-12-17 DIAGNOSIS — I160 Hypertensive urgency: Secondary | ICD-10-CM | POA: Diagnosis not present

## 2020-12-17 DIAGNOSIS — E871 Hypo-osmolality and hyponatremia: Secondary | ICD-10-CM

## 2020-12-17 DIAGNOSIS — N184 Chronic kidney disease, stage 4 (severe): Secondary | ICD-10-CM | POA: Diagnosis not present

## 2020-12-17 DIAGNOSIS — E876 Hypokalemia: Secondary | ICD-10-CM | POA: Diagnosis not present

## 2020-12-17 LAB — BASIC METABOLIC PANEL
Anion gap: 10 (ref 5–15)
BUN: 35 mg/dL — ABNORMAL HIGH (ref 6–20)
CO2: 22 mmol/L (ref 22–32)
Calcium: 8.3 mg/dL — ABNORMAL LOW (ref 8.9–10.3)
Chloride: 102 mmol/L (ref 98–111)
Creatinine, Ser: 3.49 mg/dL — ABNORMAL HIGH (ref 0.44–1.00)
GFR, Estimated: 14 mL/min — ABNORMAL LOW (ref >60)
Glucose, Bld: 133 mg/dL — ABNORMAL HIGH (ref 70–99)
Potassium: 3.7 mmol/L (ref 3.5–5.1)
Sodium: 134 mmol/L — ABNORMAL LOW (ref 135–145)

## 2020-12-17 LAB — CBC
HCT: 30.4 % — ABNORMAL LOW (ref 36.0–46.0)
Hemoglobin: 10.4 g/dL — ABNORMAL LOW (ref 12.0–15.0)
MCH: 30.1 pg (ref 26.0–34.0)
MCHC: 34.2 g/dL (ref 30.0–36.0)
MCV: 88.1 fL (ref 80.0–100.0)
Platelets: 287 10*3/uL (ref 150–400)
RBC: 3.45 MIL/uL — ABNORMAL LOW (ref 3.87–5.11)
RDW: 12.8 % (ref 11.5–15.5)
WBC: 8.8 10*3/uL (ref 4.0–10.5)
nRBC: 0 % (ref 0.0–0.2)

## 2020-12-17 LAB — GLUCOSE, CAPILLARY
Glucose-Capillary: 117 mg/dL — ABNORMAL HIGH (ref 70–99)
Glucose-Capillary: 136 mg/dL — ABNORMAL HIGH (ref 70–99)
Glucose-Capillary: 143 mg/dL — ABNORMAL HIGH (ref 70–99)
Glucose-Capillary: 212 mg/dL — ABNORMAL HIGH (ref 70–99)

## 2020-12-17 MED ORDER — GLUCERNA SHAKE PO LIQD
237.0000 mL | Freq: Three times a day (TID) | ORAL | Status: DC
  Administered 2020-12-17 – 2020-12-18 (×2): 237 mL via ORAL
  Filled 2020-12-17: qty 237

## 2020-12-17 MED ORDER — METOCLOPRAMIDE HCL 5 MG PO TABS
5.0000 mg | ORAL_TABLET | Freq: Three times a day (TID) | ORAL | Status: DC
  Administered 2020-12-17 – 2020-12-20 (×13): 5 mg via ORAL
  Filled 2020-12-17 (×12): qty 1

## 2020-12-17 MED ORDER — CARVEDILOL 6.25 MG PO TABS
6.2500 mg | ORAL_TABLET | Freq: Two times a day (BID) | ORAL | Status: DC
  Administered 2020-12-17 (×2): 6.25 mg via ORAL
  Filled 2020-12-17 (×2): qty 1

## 2020-12-17 NOTE — Evaluation (Addendum)
Physical Therapy Evaluation and Discharge Patient Details Name: Lorraine Turner MRN: [id number] DOB: 04-14-1960 Today's Date: 12/17/2020  History of Present Illness  Pt is a 60 y.o. F who presents 12/15/2020 with ongoing nausea and vomiting; unclear etiology. Found to have systolic blood pressure in the 502D systolically in the ED, hypokalemia, and AKI on CKD 4. Significant PMH: CKD 4, depression, anxiety, OCD, schizoaffective, bipolar, history of suicide attempt, diabetes.  Clinical Impression  Patient evaluated by Physical Therapy with no further acute PT needs identified. Pt mildly drowsy upon arrival, but more alert as session progresses. She denies nausea. Pt ambulating 250 feet with no assistive device at a supervision level. Displays mild balance deficits, but no overt loss of balance noted. Starting BP: 179/67 (98), Ending BP: 159/67 (95). Prior to admission, pt reports she is homeless and has been staying at friend's houses. She states she has had difficulty obtaining medications due to lack of transportation; I provided her a brochure for [employer] and [employer] ([place]) and also notified CSW. All education has been completed and the patient has no further questions. No follow-up Physical Therapy or equipment needs. PT is signing off. Thank you for this referral.      Recommendations for follow up therapy are one component of a multi-disciplinary discharge planning process, led by the attending physician.  Recommendations may be updated based on patient status, additional functional criteria and insurance authorization.  Follow Up Recommendations No PT follow up    Assistance Recommended at Discharge Intermittent Supervision/Assistance  Functional Status Assessment Patient has not had a recent decline in their functional status  Equipment Recommendations  None recommended by PT    Recommendations for Other Services       Precautions / Restrictions  Precautions Precautions: Fall Precaution Comments: low fall risk Restrictions Weight Bearing Restrictions: No      Mobility  Bed Mobility Overal bed mobility: Independent                  Transfers Overall transfer level: Independent Equipment used: None                    Ambulation/Gait Ambulation/Gait assistance: Supervision Gait Distance (Feet): 250 Feet Assistive device: None Gait Pattern/deviations: Drifts right/left Gait velocity: decreased   General Gait Details: Mild drift, supervision for safety  Stairs            Wheelchair Mobility    Modified Rankin (Stroke Patients Only)       Balance Overall balance assessment: Mild deficits observed, not formally tested                                           Pertinent Vitals/Pain Pain Assessment: No/denies pain    Home Living Family/patient expects to be discharged to:: Shelter/Homeless                   Additional Comments: Pt reports sleeping at friend's houses    Prior Function Prior Level of Function : Independent/Modified Independent                     Hand Dominance        Extremity/Trunk Assessment   Upper Extremity Assessment Upper Extremity Assessment: Overall WFL for tasks assessed    Lower Extremity Assessment Lower Extremity Assessment: Overall WFL for tasks assessed  Communication   Communication: No difficulties  Cognition   Behavior During Therapy: WFL for tasks assessed/performed Overall Cognitive Status: Within Functional Limits for tasks assessed                                 General Comments: drowsy, more alert towards end of sesssion        General Comments      Exercises     Assessment/Plan    PT Assessment Patient does not need any further PT services  PT Problem List         PT Treatment Interventions      PT Goals (Current goals can be found in the Care Plan section)  Acute  Rehab PT Goals Patient Stated Goal: did not state PT Goal Formulation: All assessment and education complete, DC therapy    Frequency     Barriers to discharge        Co-evaluation               AM-PAC PT "6 Clicks" Mobility  Outcome Measure Help needed turning from your back to your side while in a flat bed without using bedrails?: None Help needed moving from lying on your back to sitting on the side of a flat bed without using bedrails?: None Help needed moving to and from a bed to a chair (including a wheelchair)?: None Help needed standing up from a chair using your arms (e.g., wheelchair or bedside chair)?: None Help needed to walk in hospital room?: A Little Help needed climbing 3-5 steps with a railing? : A Little 6 Click Score: 22    End of Session   Activity Tolerance: Patient tolerated treatment well Patient left: in bed;with call bell/phone within reach;with bed alarm set Nurse Communication: Mobility status PT Visit Diagnosis: Unsteadiness on feet (R26.81)    Time: 1004-1020 PT Time Calculation (min) (ACUTE ONLY): 16 min   Charges:   PT Evaluation $PT Eval Low Complexity: 1 Low          Wyona Almas, PT, DPT Acute Rehabilitation Services Pager [phone] Office [phone]   Deno Etienne 12/17/2020, 11:01 AM

## 2020-12-17 NOTE — Progress Notes (Signed)
CSW received verbal consult for transportation resources for patient. CSW spoke with patient at bedside. CSW offered patient medicaid transportation resources. Patient accepted. No further questions reported at this time.

## 2020-12-17 NOTE — Progress Notes (Signed)
PROGRESS NOTE  Lorraine Turner UXY:[id number] DOB: July 14, 1960   PCP: [name], MD  Patient is from: Home.  DOA: 12/15/2020 LOS: 1  Chief complaints:  Chief Complaint  Patient presents with   Emesis     Brief Narrative / Interim history: 60 year old F with PMH of CKD-4, DM-2, HTN, HLD, anxiety, depression, OCD, schizoaffective disorder, bipolar disorder presenting with intractable nausea and vomiting, and hypertensive urgency.  Reportedly out of her home medications for about a month due to "lack of transportation".  She was a started on IV fluid.     Subjective: Seen and examined earlier this morning.  No major events overnight of this morning.  She is sleepy but wakes to voice easily.  She is oriented x4.  She reports improvement in nausea and vomiting but has not eaten her breakfast.  She denies abdominal pain or diarrhea.  She denies audiovisual hallucination, suicidal and homicidal ideation.  Objective: Vitals:   12/16/20 1700 12/16/20 2037 12/17/20 0815 12/17/20 1120  BP:  (!) 167/67 (!) 161/60   Pulse: 73 70 72   Resp:  17 18   Temp:  98.9 F (37.2 C) 98.9 F (37.2 C) 98.7 F (37.1 C)  TempSrc:  Oral Oral Oral  SpO2:  99% 98%   Weight:      Height:        Intake/Output Summary (Last 24 hours) at 12/17/2020 1206 Last data filed at 12/16/2020 1700 Gross per 24 hour  Intake 240 ml  Output --  Net 240 ml   Filed Weights   12/16/20 0125  Weight: 56.5 kg    Examination:  GENERAL: No apparent distress.  Nontoxic. HEENT: MMM.  Vision and hearing grossly intact.  NECK: Supple.  No apparent JVD.  RESP:  No IWOB.  Fair aeration bilaterally. CVS:  RRR. Heart sounds normal.  ABD/GI/GU: BS+. Abd soft, NTND.  MSK/EXT:  Moves extremities. No apparent deformity. No edema.  SKIN: no apparent skin lesion or wound NEURO: Sleepy but wakes to voice easily.  Oriented x4.  No apparent focal neuro deficit. PSYCH: Calm. Normal affect.   Procedures:   None  Microbiology summarized: TYOMA-00 and influenza PCR nonreactive.  Assessment & Plan: Intractable nausea and vomiting: Unclear etiology but but could be gastroparesis from uncontrolled diabetes -Continue IV fluid hydration -Change LR to NS -Scheduled Reglan instead of as needed -Continue Zofran as needed   Hypertensive urgency: SBP as high as 220.  BP improved.  Likely from not taking her medication -Stop hydralazine, increase Coreg to 6.25 mg twice daily and continue amlodipine -Recommend simple regimen given noncompliance. -Labetalol as needed   CKD-4/azotemia: AKI ruled out. Recent Labs    11/16/20 2007 11/23/20 1441 11/24/20 0138 11/24/20 0559 11/25/20 0543 11/26/20 0501 12/07/20 1028 12/09/20 0533 12/15/20 1805 12/16/20 0450 12/17/20 0648  BUN 32* 68* 70*  --  61* 51* 47* 60* 41* 40* 35*  CREATININE 3.28* 4.67* 4.22* 4.33* 3.65* 3.36* 3.53* 3.75* 3.85* 3.83* 3.49*  -Avoid nephrotoxic meds -Continue monitoring -Needs nephrology follow-up if she has not 1 established yet   Uncontrolled DM-2 with hyperglycemia: A1c 8.3% on 11/19/2020. Recent Labs  Lab 12/16/20 1224 12/16/20 1658 12/16/20 2233 12/17/20 0632 12/17/20 1148  GLUCAP 87 121* 124* 117* 136*  -Continue current insulin regimen   Hyponatremia: Likely from CKD.  Stable. Recent Labs  Lab 12/15/20 1805 12/16/20 0450 12/17/20 0648  NA 132* 134* 134*  -Continue monitoring  Anemia of chronic kidney disease: H&H stable. Recent Labs  11/24/20 0559 11/24/20 1207 11/25/20 0543 11/26/20 0501 11/26/20 1920 12/07/20 1028 12/09/20 0533 12/15/20 1805 12/16/20 0450 12/17/20 0648  HGB 7.2* 7.5* 6.8* 7.2* 10.4* 12.7 12.5 12.0 10.3* 10.4*  -Continue monitoring    Elevated troponin: 19.  Likely delayed clearance from renal failure.  History of anxiety/depression/schizoaffective disorder/bipolar disorder/suicidal ideation: Stable.  She denies suicidal ideation, homicidal ideation or audiovisual  hallucination. -Continue home medications  Social issues: Homeless and noncompliant. -TOC consulted and offered resources for shelter and housing -Refill her meds at [facility] prior to discharge.  Body mass index is 22.78 kg/m.         DVT prophylaxis:  heparin injection 5,000 Units Start: 12/15/20 2345  Code Status: Full code Family Communication: Patient and/or RN. Available if any question.  Level of care: Telemetry Medical Status is: Inpatient  Remains inpatient appropriate because: Need for IV fluid due to poor p.o. intake in the setting of intractable nausea and vomiting and adjustment of antihypertensive meds     Consultants:  None   Sch Meds:  Scheduled Meds:  amLODipine  10 mg Oral Daily   busPIRone  5 mg Oral BID   carvedilol  6.25 mg Oral BID WC   heparin  5,000 Units Subcutaneous Q8H   insulin aspart  0-9 Units Subcutaneous TID WC   QUEtiapine  100 mg Oral QHS   risperiDONE  1 mg Oral QHS   sertraline  50 mg Oral Daily   sodium chloride flush  3 mL Intravenous Q12H   Continuous Infusions: PRN Meds:.acetaminophen **OR** acetaminophen, labetalol, metoCLOPramide (REGLAN) injection, ondansetron **OR** ondansetron (ZOFRAN) IV, polyethylene glycol  Antimicrobials: Anti-infectives (From admission, onward)    None        I have personally reviewed the following labs and images: CBC: Recent Labs  Lab 12/15/20 1805 12/16/20 0450 12/17/20 0648  WBC 11.0* 10.7* 8.8  NEUTROABS 8.5*  --   --   HGB 12.0 10.3* 10.4*  HCT 34.1* 29.8* 30.4*  MCV 84.8 87.1 88.1  PLT 379 304 287   BMP &GFR Recent Labs  Lab 12/15/20 1805 12/16/20 0042 12/16/20 0450 12/17/20 0648  NA 132*  --  134* 134*  K 3.3*  --  3.5 3.7  CL 93*  --  98 102  CO2 27  --  27 22  GLUCOSE 282*  --  214* 133*  BUN 41*  --  40* 35*  CREATININE 3.85*  --  3.83* 3.49*  CALCIUM 8.9  --  8.2* 8.3*  MG  --  2.4  --   --    Estimated Creatinine Clearance: 13.7 mL/min (A) (by C-G  formula based on SCr of 3.49 mg/dL (H)). Liver & Pancreas: Recent Labs  Lab 12/15/20 1805 12/16/20 0450  AST 15 13*  ALT 14 11  ALKPHOS 87 71  BILITOT 1.2 1.1  PROT 7.3 6.0*  ALBUMIN 3.3* 2.7*   Recent Labs  Lab 12/15/20 1805  LIPASE 31   No results for input(s): AMMONIA in the last 168 hours. Diabetic: No results for input(s): HGBA1C in the last 72 hours. Recent Labs  Lab 12/16/20 1224 12/16/20 1658 12/16/20 2233 12/17/20 0632 12/17/20 1148  GLUCAP 87 121* 124* 117* 136*   Cardiac Enzymes: No results for input(s): CKTOTAL, CKMB, CKMBINDEX, TROPONINI in the last 168 hours. No results for input(s): PROBNP in the last 8760 hours. Coagulation Profile: No results for input(s): INR, PROTIME in the last 168 hours. Thyroid Function Tests: No results for input(s): TSH, T4TOTAL, FREET4,  T3FREE, THYROIDAB in the last 72 hours. Lipid Profile: No results for input(s): CHOL, HDL, LDLCALC, TRIG, CHOLHDL, LDLDIRECT in the last 72 hours. Anemia Panel: No results for input(s): VITAMINB12, FOLATE, FERRITIN, TIBC, IRON, RETICCTPCT in the last 72 hours. Urine analysis:    Component Value Date/Time   COLORURINE YELLOW 12/09/2020 0533   APPEARANCEUR CLOUDY (A) 12/09/2020 0533   LABSPEC 1.014 12/09/2020 0533   PHURINE 7.0 12/09/2020 0533   GLUCOSEU 150 (A) 12/09/2020 0533   HGBUR SMALL (A) 12/09/2020 0533   HGBUR negative 10/12/2008 1103   BILIRUBINUR NEGATIVE 12/09/2020 0533   BILIRUBINUR negative 10/21/2019 1447   BILIRUBINUR negative 03/10/2017 1459   KETONESUR 5 (A) 12/09/2020 0533   PROTEINUR >=300 (A) 12/09/2020 0533   UROBILINOGEN 0.2 10/21/2019 1447   UROBILINOGEN 0.2 09/30/2019 1151   NITRITE NEGATIVE 12/09/2020 0533   LEUKOCYTESUR NEGATIVE 12/09/2020 0533   Sepsis Labs: Invalid input(s): PROCALCITONIN, [place]  Microbiology: Recent Results (from the past 240 hour(s))  Resp Panel by RT-PCR (Flu A&B, Covid) Nasopharyngeal Swab     Status: None   Collection  Time: 12/15/20 10:53 PM   Specimen: Nasopharyngeal Swab; Nasopharyngeal(NP) swabs in vial transport medium  Result Value Ref Range Status   SARS Coronavirus 2 by RT PCR NEGATIVE NEGATIVE Final    Comment: (NOTE) SARS-CoV-2 target nucleic acids are NOT DETECTED.  The SARS-CoV-2 RNA is generally detectable in upper respiratory specimens during the acute phase of infection. The lowest concentration of SARS-CoV-2 viral copies this assay can detect is 138 copies/mL. A negative result does not preclude SARS-Cov-2 infection and should not be used as the sole basis for treatment or other patient management decisions. A negative result may occur with  improper specimen collection/handling, submission of specimen other than nasopharyngeal swab, presence of viral mutation(s) within the areas targeted by this assay, and inadequate number of viral copies(<138 copies/mL). A negative result must be combined with clinical observations, patient history, and epidemiological information. The expected result is Negative.  Fact Sheet for Patients:  EntrepreneurPulse.com.au  Fact Sheet for Healthcare Providers:  IncredibleEmployment.be  This test is no t yet approved or cleared by the [place] FDA and  has been authorized for detection and/or diagnosis of SARS-CoV-2 by FDA under an Emergency Use Authorization (EUA). This EUA will remain  in effect (meaning this test can be used) for the duration of the COVID-19 declaration under Section 564(b)(1) of the Act, 21 U.S.C.section 360bbb-3(b)(1), unless the authorization is terminated  or revoked sooner.       Influenza A by PCR NEGATIVE NEGATIVE Final   Influenza B by PCR NEGATIVE NEGATIVE Final    Comment: (NOTE) The Xpert Xpress SARS-CoV-2/FLU/RSV plus assay is intended as an aid in the diagnosis of influenza from Nasopharyngeal swab specimens and should not be used as a sole basis for treatment. Nasal washings  and aspirates are unacceptable for Xpert Xpress SARS-CoV-2/FLU/RSV testing.  Fact Sheet for Patients: EntrepreneurPulse.com.au  Fact Sheet for Healthcare Providers: IncredibleEmployment.be  This test is not yet approved or cleared by the [place] FDA and has been authorized for detection and/or diagnosis of SARS-CoV-2 by FDA under an Emergency Use Authorization (EUA). This EUA will remain in effect (meaning this test can be used) for the duration of the COVID-19 declaration under Section 564(b)(1) of the Act, 21 U.S.C. section 360bbb-3(b)(1), unless the authorization is terminated or revoked.  Performed at [facility], [place] [address] [place],  [address]     Radiology Studies: No results  found.    [place] Cawood T. Bartlett  If 7PM-7AM, please contact night-coverage [email or web address] 12/17/2020, 12:06 PM

## 2020-12-18 DIAGNOSIS — E876 Hypokalemia: Secondary | ICD-10-CM | POA: Diagnosis not present

## 2020-12-18 DIAGNOSIS — R112 Nausea with vomiting, unspecified: Secondary | ICD-10-CM | POA: Diagnosis not present

## 2020-12-18 DIAGNOSIS — N184 Chronic kidney disease, stage 4 (severe): Secondary | ICD-10-CM | POA: Diagnosis not present

## 2020-12-18 DIAGNOSIS — I160 Hypertensive urgency: Secondary | ICD-10-CM | POA: Diagnosis not present

## 2020-12-18 LAB — RENAL FUNCTION PANEL
Albumin: 2.6 g/dL — ABNORMAL LOW (ref 3.5–5.0)
Anion gap: 8 (ref 5–15)
BUN: 41 mg/dL — ABNORMAL HIGH (ref 6–20)
CO2: 24 mmol/L (ref 22–32)
Calcium: 8.3 mg/dL — ABNORMAL LOW (ref 8.9–10.3)
Chloride: 97 mmol/L — ABNORMAL LOW (ref 98–111)
Creatinine, Ser: 3.32 mg/dL — ABNORMAL HIGH (ref 0.44–1.00)
GFR, Estimated: 15 mL/min — ABNORMAL LOW (ref >60)
Glucose, Bld: 234 mg/dL — ABNORMAL HIGH (ref 70–99)
Phosphorus: 4.1 mg/dL (ref 2.5–4.6)
Potassium: 3.7 mmol/L (ref 3.5–5.1)
Sodium: 129 mmol/L — ABNORMAL LOW (ref 135–145)

## 2020-12-18 LAB — GLUCOSE, CAPILLARY
Glucose-Capillary: 189 mg/dL — ABNORMAL HIGH (ref 70–99)
Glucose-Capillary: 133 mg/dL — ABNORMAL HIGH (ref 70–99)
Glucose-Capillary: 130 mg/dL — ABNORMAL HIGH (ref 70–99)
Glucose-Capillary: 139 mg/dL — ABNORMAL HIGH (ref 70–99)

## 2020-12-18 LAB — CBC
HCT: 29.1 % — ABNORMAL LOW (ref 36.0–46.0)
Hemoglobin: 9.9 g/dL — ABNORMAL LOW (ref 12.0–15.0)
MCH: 30.1 pg (ref 26.0–34.0)
MCHC: 34 g/dL (ref 30.0–36.0)
MCV: 88.4 fL (ref 80.0–100.0)
Platelets: 301 10*3/uL (ref 150–400)
RBC: 3.29 MIL/uL — ABNORMAL LOW (ref 3.87–5.11)
RDW: 12.5 % (ref 11.5–15.5)
WBC: 8.9 10*3/uL (ref 4.0–10.5)
nRBC: 0 % (ref 0.0–0.2)

## 2020-12-18 LAB — MAGNESIUM: Magnesium: 2 mg/dL (ref 1.7–2.4)

## 2020-12-18 MED ORDER — ENSURE MAX PROTEIN PO LIQD
11.0000 [oz_av] | Freq: Every day | ORAL | Status: DC
  Administered 2020-12-19: 11 [oz_av] via ORAL

## 2020-12-18 MED ORDER — ADULT MULTIVITAMIN W/MINERALS CH
1.0000 | ORAL_TABLET | Freq: Every day | ORAL | Status: DC
  Administered 2020-12-18 – 2020-12-20 (×3): 1 via ORAL
  Filled 2020-12-18 (×2): qty 1

## 2020-12-18 MED ORDER — CARVEDILOL 12.5 MG PO TABS
12.5000 mg | ORAL_TABLET | Freq: Two times a day (BID) | ORAL | Status: DC
  Filled 2020-12-18: qty 1

## 2020-12-18 MED ORDER — CARVEDILOL 12.5 MG PO TABS
12.5000 mg | ORAL_TABLET | Freq: Two times a day (BID) | ORAL | Status: DC
  Administered 2020-12-18 – 2020-12-20 (×6): 12.5 mg via ORAL
  Filled 2020-12-18 (×5): qty 1

## 2020-12-18 MED ORDER — HYDRALAZINE HCL 25 MG PO TABS
25.0000 mg | ORAL_TABLET | Freq: Four times a day (QID) | ORAL | Status: DC | PRN
  Administered 2020-12-18: 25 mg via ORAL
  Filled 2020-12-18: qty 1

## 2020-12-18 NOTE — Progress Notes (Signed)
PROGRESS NOTE  Lorraine Turner KNL:[id number] DOB: 09/03/1960   PCP: [name], MD  Patient is from: Stays with friend.   DOA: 12/15/2020 LOS: 2  Chief complaints:  Chief Complaint  Patient presents with   Emesis     Brief Narrative / Interim history: 60 year old F with PMH of CKD-4, DM-2, HTN, HLD, anxiety, depression, OCD, schizoaffective disorder, bipolar disorder presenting with intractable nausea and vomiting, and hypertensive urgency.  Reportedly out of her home medications for about a month due to "lack of transportation".  She was a started on IV fluid.   Patient's GI symptoms resolved.  Main issue is uncontrolled blood pressure.  She might be able to go home on current regimen on 11/80/22 if BP within acceptable range.     Subjective: Seen and examined earlier this morning.  No major events overnight of this morning.  SBP as high as 190s earlier this morning.  She has no complaints.  Denies chest pain, dyspnea, GI or UTI symptoms.  She says she ate good portion of her breakfast.  Objective: Vitals:   12/17/20 2215 12/18/20 0900 12/18/20 0903 12/18/20 1129  BP: (!) 159/69 (!) 185/64 (!) 190/78 (!) 165/62  Pulse: 66   61  Resp: 18   17  Temp: 98.9 F (37.2 C)   98.8 F (37.1 C)  TempSrc: Oral   Oral  SpO2: 97%   100%  Weight:      Height:       No intake or output data in the 24 hours ending 12/18/20 1448  Filed Weights   12/16/20 0125  Weight: 56.5 kg    Examination:  GENERAL: No apparent distress.  Nontoxic. HEENT: MMM.  Vision and hearing grossly intact.  NECK: Supple.  No apparent JVD.  RESP: 100% on RA.  No IWOB.  Fair aeration bilaterally. CVS:  RRR. Heart sounds normal.  ABD/GI/GU: BS+. Abd soft, NTND.  MSK/EXT:  Moves extremities. No apparent deformity. No edema.  SKIN: no apparent skin lesion or wound NEURO: Awake and alert. Oriented appropriately.  No apparent focal neuro deficit. PSYCH: Calm. Normal affect.   Procedures:   None  Microbiology summarized: [id number] and influenza PCR nonreactive.  Assessment & Plan: Intractable nausea and vomiting: Unclear etiology but but could be gastroparesis.  Resolved. -Continue Reglan scheduled -Continue Zofran as needed   Hypertensive urgency: SBP as high as 220.  BP improved but not quite at goal. -Stopped hydralazine.  She might not be compliant with 3 times daily dosing.   -Increase Coreg to 12.5 mg twice daily  -Continue amlodipine 10 mg daily  -Change labetalol to hydralazine as needed with parameters -Recommend simple regimen on discharge given noncompliance.   CKD-4/azotemia: AKI ruled out.  Renal function improving. Recent Labs    11/23/20 1441 11/24/20 0138 11/24/20 0559 11/25/20 0543 11/26/20 0501 12/07/20 1028 12/09/20 0533 12/15/20 1805 12/16/20 0450 12/17/20 0648 12/18/20 0137  BUN 68* 70*  --  61* 51* 47* 60* 41* 40* 35* 41*  CREATININE 4.67* 4.22* 4.33* 3.65* 3.36* 3.53* 3.75* 3.85* 3.83* 3.49* 3.32*  -Avoid nephrotoxic meds -Continue monitoring -Needs nephrology follow-up outpatient.   Uncontrolled DM-2 with hyperglycemia: A1c 8.3% on 11/19/2020. Recent Labs  Lab 12/17/20 1148 12/17/20 1624 12/17/20 2009 12/18/20 0728 12/18/20 1125  GLUCAP 136* 143* 212* 189* 133*  -Continue current insulin regimen   Hyponatremia: Likely from CKD.  Stable. Recent Labs  Lab 12/15/20 1805 12/16/20 0450 12/17/20 0648 12/18/20 0137  NA 132* 134* 134* 129*  -Recheck  in the morning  Anemia of chronic kidney disease/IDA: H&H seems to be at baseline.  Some element of dilutional change Recent Labs    11/24/20 1207 11/25/20 0543 11/26/20 0501 11/26/20 1920 12/07/20 1028 12/09/20 0533 12/15/20 1805 12/16/20 0450 12/17/20 0648 12/18/20 0137  HGB 7.5* 6.8* 7.2* 10.4* 12.7 12.5 12.0 10.3* 10.4* 9.9*  -Continue monitoring -Check anemia panel in the morning   Elevated troponin: 19.  Likely delayed clearance from renal failure.  History of  anxiety/depression/schizoaffective disorder/bipolar disorder/suicidal ideation: Stable.  She denies suicidal ideation, homicidal ideation or audiovisual hallucination. -Continue home medications  Social issues: Homeless and noncompliant.  Reports staying with friend. -TOC consulted and offered resources for shelter and housing -Refill her meds at [facility] prior to discharge.  Inadequate oral intake Body mass index is 22.78 kg/m. Nutrition Problem: Inadequate oral intake Etiology: nausea, vomiting Signs/Symptoms: per patient/family report Interventions: Glucerna shake, Premier Protein, MVI   DVT prophylaxis:  heparin injection 5,000 Units Start: 12/15/20 2345  Code Status: Full code Family Communication: Patient and/or RN. Available if any question.  Level of care: Telemetry Medical Status is: Inpatient  Remains inpatient appropriate because: Hemodynamically unstable due to uncontrolled hypertension requiring fine adjustment of his antihypertensive meds and close monitoring     Consultants:  None   Sch Meds:  Scheduled Meds:  amLODipine  10 mg Oral Daily   busPIRone  5 mg Oral BID   carvedilol  12.5 mg Oral BID WC   feeding supplement (GLUCERNA SHAKE)  237 mL Oral TID BM   heparin  5,000 Units Subcutaneous Q8H   insulin aspart  0-9 Units Subcutaneous TID WC   metoCLOPramide  5 mg Oral TID AC & HS   multivitamin with minerals  1 tablet Oral Daily   Ensure Max Protein  11 oz Oral Daily   QUEtiapine  100 mg Oral QHS   risperiDONE  1 mg Oral QHS   sertraline  50 mg Oral Daily   sodium chloride flush  3 mL Intravenous Q12H   Continuous Infusions: PRN Meds:.acetaminophen **OR** acetaminophen, hydrALAZINE, ondansetron **OR** ondansetron (ZOFRAN) IV, polyethylene glycol  Antimicrobials: Anti-infectives (From admission, onward)    None        I have personally reviewed the following labs and images: CBC: Recent Labs  Lab 12/15/20 1805 12/16/20 0450  12/17/20 0648 12/18/20 0137  WBC 11.0* 10.7* 8.8 8.9  NEUTROABS 8.5*  --   --   --   HGB 12.0 10.3* 10.4* 9.9*  HCT 34.1* 29.8* 30.4* 29.1*  MCV 84.8 87.1 88.1 88.4  PLT 379 304 287 301   BMP &GFR Recent Labs  Lab 12/15/20 1805 12/16/20 0042 12/16/20 0450 12/17/20 0648 12/18/20 0137  NA 132*  --  134* 134* 129*  K 3.3*  --  3.5 3.7 3.7  CL 93*  --  98 102 97*  CO2 27  --  27 22 24   GLUCOSE 282*  --  214* 133* 234*  BUN 41*  --  40* 35* 41*  CREATININE 3.85*  --  3.83* 3.49* 3.32*  CALCIUM 8.9  --  8.2* 8.3* 8.3*  MG  --  2.4  --   --  2.0  PHOS  --   --   --   --  4.1   Estimated Creatinine Clearance: 14.4 mL/min (A) (by C-G formula based on SCr of 3.32 mg/dL (H)). Liver & Pancreas: Recent Labs  Lab 12/15/20 1805 12/16/20 0450 12/18/20 0137  AST 15 13*  --  ALT 14 11  --   ALKPHOS 87 71  --   BILITOT 1.2 1.1  --   PROT 7.3 6.0*  --   ALBUMIN 3.3* 2.7* 2.6*   Recent Labs  Lab 12/15/20 1805  LIPASE 31   No results for input(s): AMMONIA in the last 168 hours. Diabetic: No results for input(s): HGBA1C in the last 72 hours. Recent Labs  Lab 12/17/20 1148 12/17/20 1624 12/17/20 2009 12/18/20 0728 12/18/20 1125  GLUCAP 136* 143* 212* 189* 133*   Cardiac Enzymes: No results for input(s): CKTOTAL, CKMB, CKMBINDEX, TROPONINI in the last 168 hours. No results for input(s): PROBNP in the last 8760 hours. Coagulation Profile: No results for input(s): INR, PROTIME in the last 168 hours. Thyroid Function Tests: No results for input(s): TSH, T4TOTAL, FREET4, T3FREE, THYROIDAB in the last 72 hours. Lipid Profile: No results for input(s): CHOL, HDL, LDLCALC, TRIG, CHOLHDL, LDLDIRECT in the last 72 hours. Anemia Panel: No results for input(s): VITAMINB12, FOLATE, FERRITIN, TIBC, IRON, RETICCTPCT in the last 72 hours. Urine analysis:    Component Value Date/Time   COLORURINE YELLOW 12/09/2020 0533   APPEARANCEUR CLOUDY (A) 12/09/2020 0533   LABSPEC 1.014  12/09/2020 0533   PHURINE 7.0 12/09/2020 0533   GLUCOSEU 150 (A) 12/09/2020 0533   HGBUR SMALL (A) 12/09/2020 0533   HGBUR negative 10/12/2008 1103   BILIRUBINUR NEGATIVE 12/09/2020 0533   BILIRUBINUR negative 10/21/2019 1447   BILIRUBINUR negative 03/10/2017 1459   KETONESUR 5 (A) 12/09/2020 0533   PROTEINUR >=300 (A) 12/09/2020 0533   UROBILINOGEN 0.2 10/21/2019 1447   UROBILINOGEN 0.2 09/30/2019 1151   NITRITE NEGATIVE 12/09/2020 0533   LEUKOCYTESUR NEGATIVE 12/09/2020 0533   Sepsis Labs: Invalid input(s): PROCALCITONIN, [place]  Microbiology: Recent Results (from the past 240 hour(s))  Resp Panel by RT-PCR (Flu A&B, Covid) Nasopharyngeal Swab     Status: None   Collection Time: 12/15/20 10:53 PM   Specimen: Nasopharyngeal Swab; Nasopharyngeal(NP) swabs in vial transport medium  Result Value Ref Range Status   SARS Coronavirus 2 by RT PCR NEGATIVE NEGATIVE Final    Comment: (NOTE) SARS-CoV-2 target nucleic acids are NOT DETECTED.  The SARS-CoV-2 RNA is generally detectable in upper respiratory specimens during the acute phase of infection. The lowest concentration of SARS-CoV-2 viral copies this assay can detect is 138 copies/mL. A negative result does not preclude SARS-Cov-2 infection and should not be used as the sole basis for treatment or other patient management decisions. A negative result may occur with  improper specimen collection/handling, submission of specimen other than nasopharyngeal swab, presence of viral mutation(s) within the areas targeted by this assay, and inadequate number of viral copies(<138 copies/mL). A negative result must be combined with clinical observations, patient history, and epidemiological information. The expected result is Negative.  Fact Sheet for Patients:  EntrepreneurPulse.com.au  Fact Sheet for Healthcare Providers:  IncredibleEmployment.be  This test is no t yet approved or cleared by  the [place] FDA and  has been authorized for detection and/or diagnosis of SARS-CoV-2 by FDA under an Emergency Use Authorization (EUA). This EUA will remain  in effect (meaning this test can be used) for the duration of the COVID-19 declaration under Section 564(b)(1) of the Act, 21 U.S.C.section 360bbb-3(b)(1), unless the authorization is terminated  or revoked sooner.       Influenza A by PCR NEGATIVE NEGATIVE Final   Influenza B by PCR NEGATIVE NEGATIVE Final    Comment: (NOTE) The Xpert Xpress SARS-CoV-2/FLU/RSV plus assay is intended  as an aid in the diagnosis of influenza from Nasopharyngeal swab specimens and should not be used as a sole basis for treatment. Nasal washings and aspirates are unacceptable for Xpert Xpress SARS-CoV-2/FLU/RSV testing.  Fact Sheet for Patients: EntrepreneurPulse.com.au  Fact Sheet for Healthcare Providers: IncredibleEmployment.be  This test is not yet approved or cleared by the [place] FDA and has been authorized for detection and/or diagnosis of SARS-CoV-2 by FDA under an Emergency Use Authorization (EUA). This EUA will remain in effect (meaning this test can be used) for the duration of the COVID-19 declaration under Section 564(b)(1) of the Act, 21 U.S.C. section 360bbb-3(b)(1), unless the authorization is terminated or revoked.  Performed at [facility], [place] [address], [place], [place] [address]     Radiology Studies: No results found.    Lorraine Nida T. [place]  If 7PM-7AM, please contact night-coverage [email or web address] 12/18/2020, 2:48 PM

## 2020-12-18 NOTE — Evaluation (Signed)
Occupational Therapy Evaluation Patient Details Name: Lorraine Turner MRN: [id number] DOB: Oct 19, 1960 Today's Date: 12/18/2020   History of Present Illness Pt is a 60 y.o. F who presents 12/15/2020 with ongoing nausea and vomiting; unclear etiology. Found to have systolic blood pressure in the 951O systolically in the ED, hypokalemia, and AKI on CKD 4. Significant PMH: CKD 4, depression, anxiety, OCD, schizoaffective, bipolar, history of suicide attempt, diabetes.   Clinical Impression   Pt presents with slightly decreased balance. During session, pt demonstrated ability to safely and independently complete ADLs and functional transfers/mobility. Pt reported no concerns regarding occupational performance. No further skilled OT needs at this time. Will sign off.     Recommendations for follow up therapy are one component of a multi-disciplinary discharge planning process, led by the attending physician.  Recommendations may be updated based on patient status, additional functional criteria and insurance authorization.   Follow Up Recommendations  No OT follow up    Assistance Recommended at Discharge PRN  Functional Status Assessment  Patient has not had a recent decline in their functional status  Equipment Recommendations  None recommended by OT    Recommendations for Other Services       Precautions / Restrictions Precautions Precautions: Fall Precaution Comments: low fall risk Restrictions Weight Bearing Restrictions: No      Mobility Bed Mobility Overal bed mobility: Independent                  Transfers Overall transfer level: Independent Equipment used: None                      Balance Overall balance assessment: Mild deficits observed, not formally tested                                         ADL either performed or assessed with clinical judgement   ADL Overall ADL's : Independent;At baseline                                              Vision         Perception     Praxis      Pertinent Vitals/Pain Pain Assessment: No/denies pain     Hand Dominance     Extremity/Trunk Assessment Upper Extremity Assessment Upper Extremity Assessment: Overall WFL for tasks assessed   Lower Extremity Assessment Lower Extremity Assessment: Overall WFL for tasks assessed       Communication Communication Communication: No difficulties   Cognition Arousal/Alertness: Awake/alert Behavior During Therapy: WFL for tasks assessed/performed Overall Cognitive Status: Within Functional Limits for tasks assessed                                       General Comments  BP 172/71 on arrival. 165/72 with OOB activity.    Exercises     Shoulder Instructions      Home Living Family/patient expects to be discharged to:: Shelter/Homeless                                 Additional Comments: Pt reports sleeping at friend's houses  Prior Functioning/Environment Prior Level of Function : Independent/Modified Independent                        OT Problem List: Impaired balance (sitting and/or standing)      OT Treatment/Interventions:      OT Goals(Current goals can be found in the care plan section) Acute Rehab OT Goals Patient Stated Goal: none stated OT Goal Formulation: With patient  OT Frequency:     Barriers to D/C:            Co-evaluation              AM-PAC OT "6 Clicks" Daily Activity     Outcome Measure Help from another person eating meals?: None Help from another person taking care of personal grooming?: None Help from another person toileting, which includes using toliet, bedpan, or urinal?: None Help from another person bathing (including washing, rinsing, drying)?: None Help from another person to put on and taking off regular upper body clothing?: None Help from another person to put on and taking off regular lower  body clothing?: None 6 Click Score: 24   End of Session Nurse Communication: Mobility status  Activity Tolerance: Patient tolerated treatment well Patient left: in bed;with call bell/phone within reach;with bed alarm set  OT Visit Diagnosis: Unsteadiness on feet (R26.81)                Time: 2761-4709 OT Time Calculation (min): 17 min Charges:  OT General Charges $OT Visit: 1 Visit OT Evaluation $OT Eval Low Complexity: 1 Low  Brandolyn Shortridge C, OT/L  Acute Rehab [place] 12/18/2020, 8:58 AM

## 2020-12-18 NOTE — Progress Notes (Signed)
Initial Nutrition Assessment  DOCUMENTATION CODES:  Not applicable  INTERVENTION:  Continue Glucerna shakes TID.  Add Ensure Max po daily, each supplement provides 150 kcal and 30 grams of protein.    Add MVI with minerals daily.  Encourage PO intake.  NUTRITION DIAGNOSIS:  Inadequate oral intake related to nausea, vomiting as evidenced by per patient/family report.  GOAL:  Patient will meet greater than or equal to 90% of their needs  MONITOR:  PO intake, Supplement acceptance, Weight trends, Labs, I & O's  REASON FOR ASSESSMENT:  Consult Poor PO  ASSESSMENT:  60 yo female with a PMH of CKD stage 4, depression, anxiety, OCD, schizoaffective, bipolar, history of suicide attempt, and T2DM who is admitted with systolic blood pressure in the 829H systolically in the ED, hypokalemia, and AKI on CKD stage 4.  RD working remotely. Attempted to call patient's room phone. Pt did not answer.  Per Epic, pt with limited meal completion documented. Ate 0% for breakfast and 0% for dinner on 11/5 and 25% for lunch yesterday.  Per Epic, pt has lost ~28 lbs (18.4%) in the past week, which is significant and severe for the time frame.  RD suspects patient is malnourished, likely in the severe acute degree, but cannot definitively diagnose at this time.  Recommend continuing Glucerna TID, and adding Ensure Max daily and MVI with minerals.  Consider liberalizing diet if intake does not improve.  Supplements: Glucerna TID  Medications: reviewed; SSI, Reglan QID  Labs: reviewed; Na 129 (L), BUN 41 (H), Crt 3.32 (H - trending down), CBG 133-212 (H) HbA1c: 8.3% (11/19/2020)  NUTRITION - FOCUSED PHYSICAL EXAM: Unable to perform - defer to follow-up  Diet Order:   Diet Order             Diet heart healthy/carb modified Room service appropriate? Yes; Fluid consistency: Thin  Diet effective now                  EDUCATION NEEDS:  No education needs have been identified at this  time  Skin:  Skin Assessment: Reviewed RN Assessment  Last BM:  unknown  Height: Ht Readings from Last 1 Encounters:  12/16/20 5\' 2"  (1.575 m)   Weight:  Wt Readings from Last 1 Encounters:  12/16/20 56.5 kg   BMI:  Body mass index is 22.78 kg/m.  Estimated Nutritional Needs:  Kcal:  1800-2000 Protein:  70-85 grams Fluid:  >1.8 L  Derrel Nip, RD, LDN (she/her/hers) Clinical Inpatient Dietitian RD Pager/After-Hours/Weekend Pager # in [place]

## 2020-12-19 ENCOUNTER — Other Ambulatory Visit (HOSPITAL_COMMUNITY): Payer: Self-pay

## 2020-12-19 LAB — RENAL FUNCTION PANEL
Albumin: 2.3 g/dL — ABNORMAL LOW (ref 3.5–5.0)
Anion gap: 9 (ref 5–15)
BUN: 42 mg/dL — ABNORMAL HIGH (ref 6–20)
CO2: 25 mmol/L (ref 22–32)
Calcium: 8.3 mg/dL — ABNORMAL LOW (ref 8.9–10.3)
Chloride: 99 mmol/L (ref 98–111)
Creatinine, Ser: 3.65 mg/dL — ABNORMAL HIGH (ref 0.44–1.00)
GFR, Estimated: 14 mL/min — ABNORMAL LOW (ref >60)
Glucose, Bld: 91 mg/dL (ref 70–99)
Phosphorus: 4.4 mg/dL (ref 2.5–4.6)
Potassium: 3.6 mmol/L (ref 3.5–5.1)
Sodium: 133 mmol/L — ABNORMAL LOW (ref 135–145)

## 2020-12-19 LAB — MAGNESIUM: Magnesium: 2.1 mg/dL (ref 1.7–2.4)

## 2020-12-19 LAB — GLUCOSE, CAPILLARY
Glucose-Capillary: 139 mg/dL — ABNORMAL HIGH (ref 70–99)
Glucose-Capillary: 143 mg/dL — ABNORMAL HIGH (ref 70–99)
Glucose-Capillary: 133 mg/dL — ABNORMAL HIGH (ref 70–99)
Glucose-Capillary: 251 mg/dL — ABNORMAL HIGH (ref 70–99)

## 2020-12-19 LAB — HEMOGLOBIN AND HEMATOCRIT, BLOOD
HCT: 28.3 % — ABNORMAL LOW (ref 36.0–46.0)
Hemoglobin: 9.6 g/dL — ABNORMAL LOW (ref 12.0–15.0)

## 2020-12-19 MED ORDER — ENSURE MAX PROTEIN PO LIQD
11.0000 [oz_av] | Freq: Every day | ORAL | 0 refills | Status: DC
  Filled 2020-12-19: qty 330, 1d supply, fill #0

## 2020-12-19 MED ORDER — POLYETHYLENE GLYCOL 3350 17 GM/SCOOP PO POWD
17.0000 g | Freq: Every day | ORAL | 0 refills | Status: DC
  Filled 2020-12-19: qty 238, 14d supply, fill #0

## 2020-12-19 MED ORDER — METOCLOPRAMIDE HCL 5 MG PO TABS
5.0000 mg | ORAL_TABLET | Freq: Three times a day (TID) | ORAL | 0 refills | Status: DC
  Filled 2020-12-19: qty 90, 23d supply, fill #0

## 2020-12-19 MED ORDER — CARVEDILOL 12.5 MG PO TABS
12.5000 mg | ORAL_TABLET | Freq: Two times a day (BID) | ORAL | 0 refills | Status: DC
  Filled 2020-12-19: qty 60, 30d supply, fill #0

## 2020-12-19 MED ORDER — GLUCERNA SHAKE PO LIQD
237.0000 mL | Freq: Three times a day (TID) | ORAL | 0 refills | Status: DC
  Filled 2020-12-19: qty 237, 1d supply, fill #0

## 2020-12-19 MED ORDER — CERTAVITE/ANTIOXIDANTS PO TABS
1.0000 | ORAL_TABLET | Freq: Every day | ORAL | 0 refills | Status: DC
  Filled 2020-12-19: qty 30, 30d supply, fill #0

## 2020-12-19 NOTE — Progress Notes (Signed)
Pt has discharge orders and have received medication from the pharmacy, however pt stated that she wont be able to leave until tomorrow, as the discharge was sudden and she is not able to secure a ride at this time.

## 2020-12-19 NOTE — Progress Notes (Signed)
2045-Received notification from Greggory Brandy regarding contacting SW in ED to arrange transportation for pt if possible. Upon further investigation, it was noted that pt is homeless and that emergency contact who is her daughter is homeless as well. Pt states "I can just go downtown I guess, I don't have any where else to go".  2100-Spoke with ED SW, and informed her of situation, as well as pt being homeless. SW states she could send up a bus pass for pt for discharge if pt is still willing to be discharged tonight.  2110-Spoke with charge nurse, Blanch Media, as well as pt and was directed to on call provider for further direction being that pt is homeless.   2230-Contacted on call provider, J. Daniels-NP regarding situation, for possible cancellation of discharge tonight and for followup in am. Per J. Daniels-NP, contact AC for further direction for possible shelter resources tonight.  2245- Per AC-John,RN, there would need to be a SW consult in the morning to address this issue. Also that it is probably too late at this time for possible discharge to a shelter.  I will reach back out to ED SW and let her know as well. J. Daniels-NP notified of the above.

## 2020-12-20 DIAGNOSIS — R112 Nausea with vomiting, unspecified: Secondary | ICD-10-CM | POA: Diagnosis not present

## 2020-12-20 LAB — GLUCOSE, CAPILLARY
Glucose-Capillary: 174 mg/dL — ABNORMAL HIGH (ref 70–99)
Glucose-Capillary: 164 mg/dL — ABNORMAL HIGH (ref 70–99)
Glucose-Capillary: 186 mg/dL — ABNORMAL HIGH (ref 70–99)
Glucose-Capillary: 259 mg/dL — ABNORMAL HIGH (ref 70–99)

## 2020-12-20 NOTE — Progress Notes (Signed)
PROGRESS NOTE  Lorraine Turner VXB:[id number] DOB: [birth year] DOA: 12/15/2020 PCP: [name], MD  Brief History   60 year old F with PMH of CKD-4, DM-2, HTN, HLD, anxiety, depression, OCD, schizoaffective disorder, bipolar disorder presenting with intractable nausea and vomiting, and hypertensive urgency.  Reportedly out of her home medications for about a month due to "lack of transportation".  She was a started on IV fluid.    Patient's GI symptoms resolved.  Main issue is uncontrolled blood pressure.  She might be able to go home on current regimen on 11/80/22 if BP within acceptable range.   BP was controlled on 12/19/2020, but it appears that the patient's discharge was delayed due to her homelessness.  Consultants  Psychiatry  Procedures  None  Antibiotics   Anti-infectives (From admission, onward)    None       Subjective  The patient is complaining of pain at the site of heparin injection. No other new complaints.  Objective   Vitals:  Vitals:   12/19/20 1942 12/20/20 0429  BP: (!) 125/50 (!) 111/57  Pulse: 61 84  Resp: 18 20  Temp: 98.2 F (36.8 C) 98.2 F (36.8 C)  SpO2: 100% 100%    Exam:  Constitutional:  The patient is awake, alert, and oriented x 3. No acute distress Respiratory:  CTA bilaterally, no w/r/r.  Respiratory effort normal. No retractions or accessory muscle use Cardiovascular:  RRR, no m/r/g No LE extremity edema   Normal pedal pulses Abdomen:  Abdomen appears normal; no tenderness or masses No hernias No HSM Musculoskeletal:  Digits/nails BUE: no clubbing, cyanosis, petechiae, infection exam of joints, bones, muscles of at least one of following: head/neck, RUE, LUE, RLE, LLE   strength and tone normal, no atrophy, no abnormal movements No tenderness, masses Normal ROM, no contractures  gait and station Skin:  No rashes, lesions, ulcers palpation of skin: no induration or nodules Neurologic:  CN 2-12  intact Sensation all 4 extremities intact Psychiatric:  Mental status Mood, affect appropriate Orientation to person, place, time  judgment and insight appear intact     I have personally reviewed the following:   Today's Data  Vitals  Lab Data  CBC, BMP  Imaging  CT abdomen and pelvis  Cardiology Data  EKG  Scheduled Meds:  amLODipine  10 mg Oral Daily   busPIRone  5 mg Oral BID   carvedilol  12.5 mg Oral BID WC   feeding supplement (GLUCERNA SHAKE)  237 mL Oral TID BM   heparin  5,000 Units Subcutaneous Q8H   insulin aspart  0-9 Units Subcutaneous TID WC   metoCLOPramide  5 mg Oral TID AC & HS   multivitamin with minerals  1 tablet Oral Daily   Ensure Max Protein  11 oz Oral Daily   QUEtiapine  100 mg Oral QHS   risperiDONE  1 mg Oral QHS   sertraline  50 mg Oral Daily   sodium chloride flush  3 mL Intravenous Q12H   Continuous Infusions:  Principal Problem:   Intractable nausea and vomiting Active Problems:   Obsessive compulsive disorder   Schizoaffective disorder, depressive type (HCC)   Type 2 diabetes mellitus with hyperglycemia (HCC)   MDD (major depressive disorder), severe (HCC)   Adjustment disorder with mixed disturbance of emotions and conduct   Hypertensive urgency   Acute renal failure superimposed on stage 4 chronic kidney disease (HCC)   Hypokalemia   LOS: 4 days    A & P  Intractable nausea and vomiting: Unclear etiology but but could be gastroparesis.  Resolved. -Continue Reglan scheduled -Continue Zofran as needed   Hypertensive urgency: SBP as high as 220.  BP improved but not quite at goal. -Stopped hydralazine.  She might not be compliant with 3 times daily dosing.   -Increase Coreg to 12.5 mg twice daily  -Continue amlodipine 10 mg daily  -Change labetalol to hydralazine as needed with parameters -Recommend simple regimen on discharge given noncompliance.   CKD-4/azotemia: AKI ruled out.  Renal function improving. Recent Labs  (within last 365 days)               Recent Labs    11/23/20 1441 11/24/20 0138 11/24/20 0559 11/25/20 0543 11/26/20 0501 12/07/20 1028 12/09/20 0533 12/15/20 1805 12/16/20 0450 12/17/20 0648 12/18/20 0137  BUN 68* 70*  --  61* 51* 47* 60* 41* 40* 35* 41*  CREATININE 4.67* 4.22* 4.33* 3.65* 3.36* 3.53* 3.75* 3.85* 3.83* 3.49* 3.32*    -Avoid nephrotoxic meds -Continue monitoring -Needs nephrology follow-up outpatient.   Uncontrolled DM-2 with hyperglycemia: A1c 8.3% on 11/19/2020. Last Labs          Recent Labs  Lab 12/17/20 1148 12/17/20 1624 12/17/20 2009 12/18/20 0728 12/18/20 1125  GLUCAP 136* 143* 212* 189* 133*    -Continue current insulin regimen   Hyponatremia: Likely from CKD.  Stable. Last Labs         Recent Labs  Lab 12/15/20 1805 12/16/20 0450 12/17/20 0648 12/18/20 0137  NA 132* 134* 134* 129*    -Recheck in the morning   Anemia of chronic kidney disease/IDA: H&H seems to be at baseline.  Some element of dilutional change Recent Labs (within last 365 days)              Recent Labs    11/24/20 1207 11/25/20 0543 11/26/20 0501 11/26/20 1920 12/07/20 1028 12/09/20 0533 12/15/20 1805 12/16/20 0450 12/17/20 0648 12/18/20 0137  HGB 7.5* 6.8* 7.2* 10.4* 12.7 12.5 12.0 10.3* 10.4* 9.9*    -Continue monitoring -Check anemia panel in the morning   Elevated troponin: 19.  Likely delayed clearance from renal failure.   History of anxiety/depression/schizoaffective disorder/bipolar disorder/suicidal ideation: Stable.  She denies suicidal ideation, homicidal ideation or audiovisual hallucination. -Continue home medications   Social issues: Homeless and noncompliant.  Reports staying with friend. -TOC consulted and offered resources for shelter and housing -Refill her meds at [facility] prior to discharge.   Inadequate oral intake Body mass index is 22.78 kg/m. Nutrition Problem: Inadequate oral intake Etiology: nausea,  vomiting Signs/Symptoms: per patient/family report Interventions: Glucerna shake, Premier Protein, MVI DVT prophylaxis:  heparin injection 5,000 Units Start: 12/15/20 2345   Code Status: Full code Family Communication: Patient and/or RN. Available if any question.  Level of care: Telemetry Medical Status is: Inpatient   Remains inpatient appropriate because: Hemodynamically unstable due to uncontrolled hypertension requiring fine adjustment of his antihypertensive meds and close monitoring    Madden Piazza, DO [facility] Direct contact: see [email or web address]  7PM-7AM contact night coverage as above 12/19/2020, 7:15 PM  LOS: 4 days

## 2020-12-20 NOTE — TOC Progression Note (Signed)
Transition of Care [facility]) - Progression Note    Patient Details  Name: Lorraine Turner MRN: [id number] Date of Birth: [birth year]  Transition of Care [facility]) CM/SW Contact  Sharin Mons, RN Phone Number: 12/20/2020, 2:30 PM  Clinical Narrative:    NCM spoke with pt regarding homelessness. Pt states she has no place of residence.No family or friends to help/assist. States son is incarcerated. Daughter homeless in [place] . Pt open to going to a shelter.Referral [facility] with [facility] [phone]), voice message left. [facility] called ( no bed availability), [facility]([phone]) sent referral form to NCM for the [facility]. Form completed by NCM and sent back to [facility].Marland KitchenMarland KitchenMarland KitchenGlenard Haring hoping to receive an answer 1-2 days.  Per [employer] currently without a bed, OK to to f/u in am. NCM to discuss case with [facility] supervisor.  TOC team will continue to monitor and assist with TOC needs.   Expected Discharge Plan: Home/Self Care Barriers to Discharge: Other (must enter comment) (homelessness)  Expected Discharge Plan and Services Expected Discharge Plan: Home/Self Care         Expected Discharge Date: 12/20/20                                     Social Determinants of Health (SDOH) Interventions    Readmission Risk Interventions Readmission Risk Prevention Plan 11/26/2020 11/24/2020  Transportation Screening Complete Complete  PCP or Specialist Appt within 3-5 Days Complete Complete  HRI or [place] Complete Complete  Social Work Consult for [place] Planning/Counseling Complete Complete  Palliative Care Screening Not Applicable Not Applicable  Medication Review [employer] photographer) Complete Complete  Some recent data might be hidden

## 2020-12-20 NOTE — Discharge Summary (Signed)
Physician Discharge Summary  Lorraine Turner MPN:[id number] DOB: December 09, 1960 DOA: 12/15/2020  PCP: [name], MD  Admit date: 12/15/2020 Discharge date: 12/20/2020  Recommendations for Outpatient Follow-up:  Discharge to home (shelter). Patient is homeless. Medications available through TOC.  Activity as tolerated. Diet heart healthy/modified carbohydrate Follow up with PCP in 7-10 days.  Chemistry and CBC to be checked on this visit and reported to PCP.  Discharge Diagnoses: Principal diagnosis is #1 Intractable nausea and vomiting Hypertensive urgency CKD IV- azotemia Uncontrolled DM II with hyperglycemia  Hyponatremia Elevated troponin (19) due to impaired renal function Anxiety/depression/schizoaffective disorder/bipolar disorder  Discharge Condition: Fair. Disposition: Shelter  Diet recommendation: Heart healthy/Carbohydrate modified.  Filed Weights   12/16/20 0125 12/20/20 0500  Weight: 56.5 kg 58 kg    History of present illness: Lorraine Turner is a 60 y.o. female with medical history significant of CKD 4, chest disorder, depression, anxiety, OCD, schizoaffective, bipolar, history of suicidal attempt, hypertension, diabetes, hyperlipidemia, anemia presenting with ongoing nausea and vomiting.   Patient states that for around the past month she has been out of her blood pressure medicines leading her to have persistently high blood pressure.  States she has been unable to get her refills due to transportation issues.  She states that for the past 2 weeks she is also had ongoing nausea and vomiting that has been nonbloody in nature.     She also reports that in the last couple of days she has noticed some intermittent shortness of breath but no chest pain.  She currently denies any chest pain nor shortness of breath.  She has some cramping with her episodes of vomiting but otherwise no abdominal pain. She denies fevers or chills as well.   Hospital Course:   60 year old F with PMH of CKD-4, DM-2, HTN, HLD, anxiety, depression, OCD, schizoaffective disorder, bipolar disorder presenting with intractable nausea and vomiting, and hypertensive urgency.  Reportedly out of her home medications for about a month due to "lack of transportation".  She was a started on IV fluid.    Patient's GI symptoms resolved.  Main issue is uncontrolled blood pressure.  She might be able to go home on current regimen on 11/80/22 if BP within acceptable range.    BP was controlled on 12/19/2020, but it appears that the patient's discharge was delayed due to her homelessness.  She will be discharged to a homeless shelter today.  Today's assessment: S: The patient is resting comfortably. No new complaints. O: Vitals:  Vitals:   12/20/20 0800 12/20/20 1542  BP: (!) 124/52 (!) 114/51  Pulse: (!) 57 (!) 58  Resp: 16 16  Temp: 99 F (37.2 C) 99.1 F (37.3 C)  SpO2: 96% 100%    Constitutional:  The patient is awake, alert, and oriented x 3. No acute distress. Respiratory:   No increased work of breathing.  No wheezes, rales, or rhonchi.  No tactile fremitus Cardiovascular:  Regular rate and rhythm No murmur, ectopy, or gallups. No LE extremity edema   Normal pedal pulses Abdomen:  Abdomen appears normal; no tenderness or masses No hernias No HSM Musculoskeletal:  Digits/nails: no clubbing, cyanosis, petechiae, infection Skin:  No rashes, lesions, ulcers palpation of skin: no induration or nodules Neurologic:  CN 2-12 intact Sensation all 4 extremities intact Psychiatric:  judgement and insight appear normal Mental status Mood, affect appropriate Orientation to person, place, time   Discharge Instructions  Discharge Instructions     Activity as tolerated - No restrictions  Complete by: As directed    Call MD for:  persistant nausea and vomiting   Complete by: As directed    Call MD for:  temperature >100.4   Complete by: As directed     Diet - low sodium heart healthy   Complete by: As directed    Diet Carb Modified   Complete by: As directed    Discharge instructions   Complete by: As directed    Discharge to home. Activity as tolerated. Diet heart healthy/modified carbohydrate Follow up with PCP in 7-10 days.  Chemistry and CBC to be checked on this visit and reported to PCP.   Increase activity slowly   Complete by: As directed       Allergies as of 12/20/2020       Reactions   Lisinopril Other (See Comments)   Patient does not wish to take this medication (she heard that it can make your kidneys worse)   Amoxicillin Rash        Medication List     STOP taking these medications    busPIRone 5 MG tablet Commonly known as: BUSPAR   hydrALAZINE 25 MG tablet Commonly known as: APRESOLINE       TAKE these medications    amLODipine 10 MG tablet Commonly known as: NORVASC Take 1 tablet (10 mg total) by mouth daily.   carvedilol 12.5 MG tablet Commonly known as: COREG Take 1 tablet (12.5 mg total) by mouth 2 (two) times daily with a meal. What changed:  medication strength how much to take   CertaVite/Antioxidants Tabs Take 1 tablet by mouth daily.   feeding supplement (GLUCERNA SHAKE) Liqd Take 237 mLs by mouth 3 (three) times daily between meals.   Ensure Max Protein Liqd Take 330 mLs (11 oz total) by mouth daily.   ferrous sulfate 325 (65 FE) MG tablet Take 1 tablet (325 mg total) by mouth daily.   gabapentin 100 MG capsule Commonly known as: Neurontin Take 1 capsule (100 mg total) by mouth 3 (three) times daily.   glucose blood test strip Use as instructed   Insulin Syringe-Needle U-100 30G X 5/16" 0.5 ML Misc Commonly known as: Safety Insulin Syringes 1 each by Does not apply route at bedtime.   metoCLOPramide 5 MG tablet Commonly known as: REGLAN Take 1 tablet (5 mg total) by mouth 4 (four) times daily -  before meals and at bedtime.   ondansetron 4 MG disintegrating  tablet Commonly known as: Zofran ODT Take 1 tablet (4 mg total) by mouth every 8 (eight) hours as needed for nausea or vomiting.   polyethylene glycol powder 17 GM/SCOOP powder Commonly known as: GLYCOLAX/MIRALAX Take 17 g by mouth daily.   QUEtiapine 100 MG tablet Commonly known as: SEROQUEL Take 1 tablet (100 mg total) by mouth at bedtime. For mood control   risperiDONE 1 MG tablet Commonly known as: RISPERDAL Take 1 tablet (1 mg total) by mouth at bedtime.   sertraline 50 MG tablet Commonly known as: ZOLOFT Take 1 tablet (50 mg total) by mouth daily.   True Metrix Meter w/Device Kit 1 Device by Does not apply route 3 (three) times daily. For blood sugar checks   TRUEplus Lancets 28G Misc 1 each by Does not apply route 3 (three) times daily. For blood sugar checks   vitamin B-12 1000 MCG tablet Commonly known as: CYANOCOBALAMIN Take 1 tablet (1,000 mcg total) by mouth daily.       Allergies  Allergen Reactions  Lisinopril Other (See Comments)    Patient does not wish to take this medication (she heard that it can make your kidneys worse)   Amoxicillin Rash    The results of significant diagnostics from this hospitalization (including imaging, microbiology, ancillary and laboratory) are listed below for reference.    Significant Diagnostic Studies: CT Abdomen Pelvis Wo Contrast  Result Date: 12/15/2020 CLINICAL DATA:  Acute abdominal pain EXAM: CT ABDOMEN AND PELVIS WITHOUT CONTRAST TECHNIQUE: Multidetector CT imaging of the abdomen and pelvis was performed following the standard protocol without IV contrast. COMPARISON:  12/09/2020 FINDINGS: Lower chest: No acute abnormality. Hepatobiliary: No focal liver abnormality is seen. Status post cholecystectomy. No biliary dilatation. Pancreas: Diffuse fatty infiltration of the pancreas is noted. Spleen: Normal in size without focal abnormality. Adrenals/Urinary Tract: Adrenal glands are within normal limits. Kidneys show no  renal calculi or obstructive changes. Bladder is again well distended similar to that seen on the prior exam. Stomach/Bowel: No obstructive or inflammatory changes of the colon are seen. The appendix is well visualized and within normal limits. No inflammatory changes are seen. Small bowel and stomach are within normal limits. Vascular/Lymphatic: Aortic atherosclerosis. No enlarged abdominal or pelvic lymph nodes. Reproductive: Uterus and bilateral adnexa are unremarkable. Other: No abdominal wall hernia or abnormality. No abdominopelvic ascites. Musculoskeletal: Degenerative changes of the thoracolumbar spine are noted. No compression deformities are noted. IMPRESSION: No acute abnormality is identified. No significant interval change from the prior exam. Electronically Signed   By: Inez Catalina M.D.   On: 12/15/2020 21:15   CT ABDOMEN PELVIS WO CONTRAST  Result Date: 12/09/2020 CLINICAL DATA:  Left lower quadrant abdominal pain with emesis EXAM: CT ABDOMEN AND PELVIS WITHOUT CONTRAST TECHNIQUE: Multidetector CT imaging of the abdomen and pelvis was performed following the standard protocol without IV contrast. COMPARISON:  CT scan of the abdomen and pelvis 11/23/2020 FINDINGS: Lower chest: No acute abnormality. Hepatobiliary: No focal liver abnormality is seen. Status post cholecystectomy. No biliary dilatation. Pancreas: Marked fatty atrophy of the pancreas. No inflammatory changes or mass identified. Spleen: Normal in size without focal abnormality. Adrenals/Urinary Tract: Adrenal glands are unremarkable. Kidneys are normal, without renal calculi, focal lesion, or hydronephrosis. Bladder is distended at approximately 10.3 x 9.8 x 8.8 cm (volume = 470 cm^3). Stomach/Bowel: Stomach is within normal limits. Appendix appears normal. No evidence of bowel wall thickening, distention, or inflammatory changes. Vascular/Lymphatic: Limited evaluation in the absence of intravenous contrast. Trace atherosclerotic  calcifications along the abdominal aorta. No aneurysm. No suspicious lymphadenopathy. Reproductive: Uterus and bilateral adnexa are unremarkable. Other: Small fat containing umbilical hernia. No evidence of ascites. Musculoskeletal: No acute or significant osseous findings. IMPRESSION: 1. No acute abnormality within the abdomen or pelvis. 2. Mild distension of the bladder which contains approximately 470 mL. Recommend clinical correlation for signs and symptoms of urinary retention. 3.  Aortic Atherosclerosis (ICD10-I70.0). Electronically Signed   By: Jacqulynn Cadet M.D.   On: 12/09/2020 09:55   CT ABDOMEN PELVIS WO CONTRAST  Result Date: 11/23/2020 CLINICAL DATA:  Lethargy, weakness, diarrhea, abdominal pain, fever EXAM: CT ABDOMEN AND PELVIS WITHOUT CONTRAST TECHNIQUE: Multidetector CT imaging of the abdomen and pelvis was performed following the standard protocol without IV contrast. COMPARISON:  09/17/2017 FINDINGS: Lower chest: There is a trace right pleural effusion. No acute airspace disease. Hepatobiliary: No focal liver abnormality is seen. Status post cholecystectomy. No biliary dilatation. Pancreas: There is fat replacement of the pancreatic parenchyma. No acute inflammatory change. Spleen: Normal in size without  focal abnormality. Adrenals/Urinary Tract: No urinary tract calculi or obstructive uropathy. Gas within the bladder lumen may reflect recent catheterization. The adrenals are unremarkable. Stomach/Bowel: No bowel obstruction or ileus. No bowel wall thickening or inflammatory change. Vascular/Lymphatic: Aortic atherosclerosis. No enlarged abdominal or pelvic lymph nodes. Reproductive: Uterus and bilateral adnexa are unremarkable. Other: No free fluid or free gas. Small fat containing umbilical hernia. Musculoskeletal: No acute or destructive bony lesions. Reconstructed images demonstrate no additional findings. IMPRESSION: 1. No urinary tract calculi or obstructive uropathy. 2. Gas within  the bladder lumen, please correlate with any recent catheterization. 3. Trace right pleural effusion. 4.  Aortic Atherosclerosis (ICD10-I70.0). Electronically Signed   By: Randa Ngo M.D.   On: 11/23/2020 15:41   DG Chest 2 View  Result Date: 11/23/2020 CLINICAL DATA:  Questionable sepsis. EXAM: CHEST - 2 VIEW COMPARISON:  Chest x-ray 11/16/2020, CT chest 09/20/2014 FINDINGS: The heart and mediastinal contours are unchanged. No focal consolidation. No pulmonary edema. Trace right pleural effusion. No pneumothorax. No acute osseous abnormality. IMPRESSION: Interval development of a trace right pleural effusion. Electronically Signed   By: Iven Finn M.D.   On: 11/23/2020 15:27    Microbiology: Recent Results (from the past 240 hour(s))  Resp Panel by RT-PCR (Flu A&B, Covid) Nasopharyngeal Swab     Status: None   Collection Time: 12/15/20 10:53 PM   Specimen: Nasopharyngeal Swab; Nasopharyngeal(NP) swabs in vial transport medium  Result Value Ref Range Status   SARS Coronavirus 2 by RT PCR NEGATIVE NEGATIVE Final    Comment: (NOTE) SARS-CoV-2 target nucleic acids are NOT DETECTED.  The SARS-CoV-2 RNA is generally detectable in upper respiratory specimens during the acute phase of infection. The lowest concentration of SARS-CoV-2 viral copies this assay can detect is 138 copies/mL. A negative result does not preclude SARS-Cov-2 infection and should not be used as the sole basis for treatment or other patient management decisions. A negative result may occur with  improper specimen collection/handling, submission of specimen other than nasopharyngeal swab, presence of viral mutation(s) within the areas targeted by this assay, and inadequate number of viral copies(<138 copies/mL). A negative result must be combined with clinical observations, patient history, and epidemiological information. The expected result is Negative.  Fact Sheet for Patients:   EntrepreneurPulse.com.au  Fact Sheet for Healthcare Providers:  IncredibleEmployment.be  This test is no t yet approved or cleared by the [place] FDA and  has been authorized for detection and/or diagnosis of SARS-CoV-2 by FDA under an Emergency Use Authorization (EUA). This EUA will remain  in effect (meaning this test can be used) for the duration of the COVID-19 declaration under Section 564(b)(1) of the Act, 21 U.S.C.section 360bbb-3(b)(1), unless the authorization is terminated  or revoked sooner.       Influenza A by PCR NEGATIVE NEGATIVE Final   Influenza B by PCR NEGATIVE NEGATIVE Final    Comment: (NOTE) The Xpert Xpress SARS-CoV-2/FLU/RSV plus assay is intended as an aid in the diagnosis of influenza from Nasopharyngeal swab specimens and should not be used as a sole basis for treatment. Nasal washings and aspirates are unacceptable for Xpert Xpress SARS-CoV-2/FLU/RSV testing.  Fact Sheet for Patients: EntrepreneurPulse.com.au  Fact Sheet for Healthcare Providers: IncredibleEmployment.be  This test is not yet approved or cleared by the [place] FDA and has been authorized for detection and/or diagnosis of SARS-CoV-2 by FDA under an Emergency Use Authorization (EUA). This EUA will remain in effect (meaning this test can be used) for the duration  of the COVID-19 declaration under Section 564(b)(1) of the Act, 21 U.S.C. section 360bbb-3(b)(1), unless the authorization is terminated or revoked.  Performed at [facility], [place] [address] [place], [place] [address]      Labs: Basic Metabolic Panel: Recent Labs  Lab 12/15/20 1805 12/16/20 0042 12/16/20 0450 12/17/20 0648 12/18/20 0137 12/19/20 0432  NA 132*  --  134* 134* 129* 133*  K 3.3*  --  3.5 3.7 3.7 3.6  CL 93*  --  98 102 97* 99  CO2 27  --  '27 22 24 25  ' GLUCOSE 282*  --  214* 133* 234* 91  BUN 41*  --  40*  35* 41* 42*  CREATININE 3.85*  --  3.83* 3.49* 3.32* 3.65*  CALCIUM 8.9  --  8.2* 8.3* 8.3* 8.3*  MG  --  2.4  --   --  2.0 2.1  PHOS  --   --   --   --  4.1 4.4   Liver Function Tests: Recent Labs  Lab 12/15/20 1805 12/16/20 0450 12/18/20 0137 12/19/20 0432  AST 15 13*  --   --   ALT 14 11  --   --   ALKPHOS 87 71  --   --   BILITOT 1.2 1.1  --   --   PROT 7.3 6.0*  --   --   ALBUMIN 3.3* 2.7* 2.6* 2.3*   Recent Labs  Lab 12/15/20 1805  LIPASE 31   No results for input(s): AMMONIA in the last 168 hours. CBC: Recent Labs  Lab 12/15/20 1805 12/16/20 0450 12/17/20 0648 12/18/20 0137 12/19/20 0432  WBC 11.0* 10.7* 8.8 8.9  --   NEUTROABS 8.5*  --   --   --   --   HGB 12.0 10.3* 10.4* 9.9* 9.6*  HCT 34.1* 29.8* 30.4* 29.1* 28.3*  MCV 84.8 87.1 88.1 88.4  --   PLT 379 304 287 301  --    Cardiac Enzymes: No results for input(s): CKTOTAL, CKMB, CKMBINDEX, TROPONINI in the last 168 hours. BNP: BNP (last 3 results) No results for input(s): BNP in the last 8760 hours.  ProBNP (last 3 results) No results for input(s): PROBNP in the last 8760 hours.  CBG: Recent Labs  Lab 12/19/20 1632 12/19/20 2145 12/20/20 0626 12/20/20 0800 12/20/20 1132  GLUCAP 133* 251* 174* 164* 186*    Principal Problem:   Intractable nausea and vomiting Active Problems:   Obsessive compulsive disorder   Schizoaffective disorder, depressive type (HCC)   Type 2 diabetes mellitus with hyperglycemia (HCC)   MDD (major depressive disorder), severe (HCC)   Adjustment disorder with mixed disturbance of emotions and conduct   Hypertensive urgency   Acute renal failure superimposed on stage 4 chronic kidney disease ([place])   Hypokalemia   Time coordinating discharge: 38 minutes  Signed:        Lavi Sheehan, DO [facility]  12/20/2020, 3:44 PM

## 2020-12-20 NOTE — TOC Progression Note (Addendum)
Transition of Care [facility]) - Initial/Assessment Note    Patient Details  Name: Lorraine Turner MRN: [id number] Date of Birth: 31-Dec-1960  [facility]) CM/SW Contact:    Milinda Antis, [place] Phone Number: 12/20/2020, 3:52 PM  Clinical Narrative:                 CSW met with the patient at bedside to complete an over the phone interview with [facility] due to homelessness.  The patient informed the intake coordinator that she was not interested.  The patient reported that they had "too many rules" and it sounded like a "place where nuns go".    RNCM also assisting with discharge planning.    16:00-  CSW informed that the patient is now wanting to leave and requesting a bus pass.  CSW gave the floor RN a bus pass for the patient.    Expected Discharge Plan: Home/Self Care Barriers to Discharge: Other (must enter comment) (homelessness)   Patient Goals and CMS Choice        Expected Discharge Plan and Services Expected Discharge Plan: Home/Self Care         Expected Discharge Date: 12/20/20                                    Prior Living Arrangements/Services                       Activities of Daily Living Home Assistive Devices/Equipment: CBG Meter ADL Screening (condition at time of admission) Patient's cognitive ability adequate to safely complete daily activities?: Yes Is the patient deaf or have difficulty hearing?: No Does the patient have difficulty seeing, even when wearing glasses/contacts?: No Does the patient have difficulty concentrating, remembering, or making decisions?: No Patient able to express need for assistance with ADLs?: Yes Does the patient have difficulty dressing or bathing?: Yes Independently performs ADLs?: Yes (appropriate for developmental age) Does the patient have difficulty walking or climbing stairs?: No Weakness of Legs: None Weakness of Arms/Hands: None  Permission Sought/Granted                   Emotional Assessment              Admission diagnosis:  Hypertensive urgency [I16.0] Intractable nausea and vomiting [R11.2] Nausea and vomiting, unspecified vomiting type [R11.2] Patient Active Problem List   Diagnosis Date Noted   Intractable nausea and vomiting 12/15/2020   Hypokalemia 12/15/2020   UTI (urinary tract infection) 11/24/2020   Acute renal failure superimposed on stage 4 chronic kidney disease ([place])    Hypertensive urgency 09/01/2020   CKD (chronic kidney disease), stage III ([employer]) 09/01/2020   DKA (diabetic ketoacidoses) 09/12/2018   Adjustment disorder with mixed disturbance of emotions and conduct    MDD (major depressive disorder), severe ([place]) 19/16/6060   Umbilical hernia with obstruction 11/24/2015   Symptomatic cholelithiasis 11/24/2015   Type 2 diabetes mellitus with hyperglycemia ([place])    Suicide attempt ([place]) 05/06/2014   Schizoaffective disorder, depressive type ([place]) 08/14/2011   UNSPECIFIED ANEMIA 01/23/2009   Primary hypertension 10/12/2008   HYPERCHOLESTEROLEMIA 01/01/2007   PANIC DISORDER 01/01/2007   Obsessive compulsive disorder 01/01/2007   PCP:  [name], MD Pharmacy:   [facility] [place] [address] Phone: [phone] Fax: [phone]  [facility] [phone] [place], [place] -  Time [address] Phone: [phone] Fax: [phone]  [facility] [address] [place] [address] Phone: [phone] Fax: [phone]     Social Determinants of Health (SDOH) Interventions    Readmission Risk Interventions Readmission Risk Prevention Plan 11/26/2020 11/24/2020  Transportation Screening Complete Complete  PCP or Specialist Appt within 3-5 Days Complete Complete  HRI or [place] Complete Complete  Social Work Consult for  [place] Planning/Counseling Complete Complete  Palliative Care Screening Not Applicable Not Applicable  Medication Review Press photographer) Complete Complete  Some recent data might be hidden

## 2020-12-21 ENCOUNTER — Telehealth: Payer: Self-pay

## 2020-12-21 ENCOUNTER — Emergency Department (HOSPITAL_COMMUNITY)
Admission: EM | Admit: 2020-12-21 | Discharge: 2020-12-21 | Disposition: A | Discharge status: Home / Self Care | Payer: Medicaid Other | Attending: Emergency Medicine | Admitting: Emergency Medicine

## 2020-12-21 ENCOUNTER — Other Ambulatory Visit: Payer: Self-pay

## 2020-12-21 ENCOUNTER — Encounter (HOSPITAL_COMMUNITY): Payer: Self-pay | Admitting: *Deleted

## 2020-12-21 DIAGNOSIS — I2510 Atherosclerotic heart disease of native coronary artery without angina pectoris: Secondary | ICD-10-CM | POA: Diagnosis not present

## 2020-12-21 DIAGNOSIS — N184 Chronic kidney disease, stage 4 (severe): Secondary | ICD-10-CM | POA: Insufficient documentation

## 2020-12-21 DIAGNOSIS — R112 Nausea with vomiting, unspecified: Secondary | ICD-10-CM | POA: Diagnosis present

## 2020-12-21 DIAGNOSIS — Z87891 Personal history of nicotine dependence: Secondary | ICD-10-CM | POA: Diagnosis not present

## 2020-12-21 DIAGNOSIS — Z794 Long term (current) use of insulin: Secondary | ICD-10-CM | POA: Diagnosis not present

## 2020-12-21 DIAGNOSIS — Z79899 Other long term (current) drug therapy: Secondary | ICD-10-CM | POA: Diagnosis not present

## 2020-12-21 DIAGNOSIS — I129 Hypertensive chronic kidney disease with stage 1 through stage 4 chronic kidney disease, or unspecified chronic kidney disease: Secondary | ICD-10-CM | POA: Diagnosis not present

## 2020-12-21 DIAGNOSIS — E1122 Type 2 diabetes mellitus with diabetic chronic kidney disease: Secondary | ICD-10-CM | POA: Insufficient documentation

## 2020-12-21 DIAGNOSIS — I159 Secondary hypertension, unspecified: Secondary | ICD-10-CM

## 2020-12-21 LAB — COMPREHENSIVE METABOLIC PANEL
ALT: 22 U/L (ref 0–44)
AST: 22 U/L (ref 15–41)
Albumin: 3.2 g/dL — ABNORMAL LOW (ref 3.5–5.0)
Alkaline Phosphatase: 81 U/L (ref 38–126)
Anion gap: 9 (ref 5–15)
BUN: 43 mg/dL — ABNORMAL HIGH (ref 6–20)
CO2: 25 mmol/L (ref 22–32)
Calcium: 8.8 mg/dL — ABNORMAL LOW (ref 8.9–10.3)
Chloride: 97 mmol/L — ABNORMAL LOW (ref 98–111)
Creatinine, Ser: 3.48 mg/dL — ABNORMAL HIGH (ref 0.44–1.00)
GFR, Estimated: 14 mL/min — ABNORMAL LOW (ref >60)
Glucose, Bld: 282 mg/dL — ABNORMAL HIGH (ref 70–99)
Potassium: 3.7 mmol/L (ref 3.5–5.1)
Sodium: 131 mmol/L — ABNORMAL LOW (ref 135–145)
Total Bilirubin: 0.7 mg/dL (ref 0.3–1.2)
Total Protein: 6.9 g/dL (ref 6.5–8.1)

## 2020-12-21 LAB — LIPASE, BLOOD: Lipase: 38 U/L (ref 11–51)

## 2020-12-21 LAB — CBC WITH DIFFERENTIAL/PLATELET
Abs Immature Granulocytes: 0.05 10*3/uL (ref 0.00–0.07)
Basophils Absolute: 0 10*3/uL (ref 0.0–0.1)
Basophils Relative: 0 %
Eosinophils Absolute: 0.1 10*3/uL (ref 0.0–0.5)
Eosinophils Relative: 1 %
HCT: 33.1 % — ABNORMAL LOW (ref 36.0–46.0)
Hemoglobin: 11.6 g/dL — ABNORMAL LOW (ref 12.0–15.0)
Immature Granulocytes: 1 %
Lymphocytes Relative: 10 %
Lymphs Abs: 1 10*3/uL (ref 0.7–4.0)
MCH: 30.3 pg (ref 26.0–34.0)
MCHC: 35 g/dL (ref 30.0–36.0)
MCV: 86.4 fL (ref 80.0–100.0)
Monocytes Absolute: 0.6 10*3/uL (ref 0.1–1.0)
Monocytes Relative: 6 %
Neutro Abs: 9.1 10*3/uL — ABNORMAL HIGH (ref 1.7–7.7)
Neutrophils Relative %: 82 %
Platelets: 345 10*3/uL (ref 150–400)
RBC: 3.83 MIL/uL — ABNORMAL LOW (ref 3.87–5.11)
RDW: 12.6 % (ref 11.5–15.5)
WBC: 11 10*3/uL — ABNORMAL HIGH (ref 4.0–10.5)
nRBC: 0 % (ref 0.0–0.2)

## 2020-12-21 MED ORDER — LORAZEPAM 2 MG/ML IJ SOLN
0.5000 mg | Freq: Once | INTRAMUSCULAR | Status: AC
  Administered 2020-12-21: 0.5 mg via INTRAVENOUS
  Filled 2020-12-21: qty 1

## 2020-12-21 MED ORDER — METOCLOPRAMIDE HCL 5 MG/ML IJ SOLN
10.0000 mg | Freq: Once | INTRAMUSCULAR | Status: AC
  Administered 2020-12-21: 10 mg via INTRAMUSCULAR
  Filled 2020-12-21: qty 2

## 2020-12-21 MED ORDER — HYDRALAZINE HCL 20 MG/ML IJ SOLN
10.0000 mg | Freq: Once | INTRAMUSCULAR | Status: AC
  Administered 2020-12-21: 10 mg via INTRAVENOUS
  Filled 2020-12-21: qty 1

## 2020-12-21 MED ORDER — LORAZEPAM 2 MG/ML IJ SOLN: 1.0000 mg | Freq: Once | INTRAMUSCULAR | Status: DC

## 2020-12-21 MED ORDER — ONDANSETRON HCL 4 MG/2ML IJ SOLN
4.0000 mg | Freq: Once | INTRAMUSCULAR | Status: AC
  Administered 2020-12-21: 4 mg via INTRAVENOUS
  Filled 2020-12-21: qty 2

## 2020-12-21 MED ORDER — NITROGLYCERIN 0.4 MG SL SUBL
0.4000 mg | SUBLINGUAL_TABLET | Freq: Once | SUBLINGUAL | Status: AC
  Administered 2020-12-21: 0.4 mg via SUBLINGUAL
  Filled 2020-12-21: qty 1

## 2020-12-21 MED ORDER — METOCLOPRAMIDE HCL 5 MG/ML IJ SOLN
5.0000 mg | Freq: Once | INTRAMUSCULAR | Status: AC
  Administered 2020-12-21: 5 mg via INTRAVENOUS
  Filled 2020-12-21: qty 2

## 2020-12-21 NOTE — Telephone Encounter (Signed)
Transition Care Management Follow-up Telephone Call Date of discharge and from where: 12/20/2020, [facility] Patient returned to ED this morning and is currently still there.

## 2020-12-21 NOTE — ED Provider Notes (Signed)
MSE was initiated and I personally evaluated the patient and placed orders (if any) at  5:48 AM on December 21, 2020.  Patient returns to the hospital for nausea and vomiting, uncontrolled at home with Zofran. No fever. No hematemesis. "I feel so bad". She reports being discharged yesterday after admission for same symptoms last week.   Today's Vitals   12/21/20 0538  BP: (!) 197/65  Pulse: 67  Resp: 20  Temp: 99.1 F (37.3 C)  SpO2: 100%   There is no height or weight on file to calculate BMI.  Cachectic, chronically ill appearing Actively vomiting Abd soft    The patient appears stable so that the remainder of the MSE may be completed by another provider.   Charlann Lange, PA-C 12/21/20 1460    Merrily Pew, MD 12/21/20 [phone]

## 2020-12-21 NOTE — ED Triage Notes (Signed)
C/o nausea and vomiting states she was just discharged from hospital yest, went home took her medication and after eating started with pain and vomiting.

## 2020-12-21 NOTE — Discharge Instructions (Addendum)
You have been seen and discharged from the emergency department.  Follow-up with gastroenterology for symptoms and your primary provider for reevaluation and further care. Take home medications as prescribed. This is extremely important to avoid nausea, vomiting and high blood pressure. If you have any worsening symptoms or further concerns for your health please return to an emergency department for further evaluation.

## 2020-12-21 NOTE — ED Provider Notes (Signed)
[facility] EMERGENCY DEPARTMENT Provider Note   CSN: [id number] Arrival date & time: 12/21/20  7096     History Chief Complaint  Patient presents with   Emesis    Lorraine Turner is a 59 y.o. female.  HPI  60 year old female with past medical history of bipolar disorder, HTN, CAD, chronic nausea/vomiting who is well-known to this department presents the emergency department with concern for ongoing nausea/vomiting and high blood pressure.  Patient has been to this emergency department for the above complaints many times.  She is required admission and treatment.  Was recently admitted and discharged yesterday for the same complaints.  States that she went home, was noncompliant with her medications, tried to eat and vomited.  Currently right now denies any chest pain, shortness of breath.  She states that she feels fatigued.  She states that she turned "42 yesterday and now feels like she is dying."  Past Medical History:  Diagnosis Date   AKI (acute kidney injury) ([place]) 09/12/2018   Anxiety    Bipolar 1 disorder ([place])    Depression    Diabetes mellitus without complication ([place])    Diarrhea 11/21/2020   Gallstones    Hyperlipidemia    Hypertension    MDD (major depressive disorder), recurrent episode, moderate ([place]) 11/18/2020   Neuropathy    Schizophrenia ([place])    Seizures ([place])    X1- febrile seizure as a child-none since   Suicide ideation 09/12/2018    Patient Active Problem List   Diagnosis Date Noted   Intractable nausea and vomiting 12/15/2020   Hypokalemia 12/15/2020   UTI (urinary tract infection) 11/24/2020   Acute renal failure superimposed on stage 4 chronic kidney disease ([place])    Hypertensive urgency 09/01/2020   CKD (chronic kidney disease), stage III ([place]) 09/01/2020   DKA (diabetic ketoacidoses) 09/12/2018   Adjustment disorder with mixed disturbance of emotions and conduct    MDD (major depressive disorder), severe ([place]) 28/36/6294    Umbilical hernia with obstruction 11/24/2015   Symptomatic cholelithiasis 11/24/2015   Type 2 diabetes mellitus with hyperglycemia ([place])    Suicide attempt ([place]) 05/06/2014   Schizoaffective disorder, depressive type ([place]) 08/14/2011   UNSPECIFIED ANEMIA 01/23/2009   Primary hypertension 10/12/2008   HYPERCHOLESTEROLEMIA 01/01/2007   PANIC DISORDER 01/01/2007   Obsessive compulsive disorder 01/01/2007    Past Surgical History:  Procedure Laterality Date   CESAREAN SECTION  05/1998   X 1   CHOLECYSTECTOMY N/A 03/27/2016   Procedure: LAPAROSCOPIC CHOLECYSTECTOMY;  Surgeon: Olean Ree, MD;  Location: [facility] ORS;  Service: General;  Laterality: N/A;   TONSILLECTOMY AND ADENOIDECTOMY     age 58   TUBAL LIGATION       OB History   No obstetric history on file.     Family History  Problem Relation Age of Onset   Diabetes Mother    Heart disease Mother    Kidney disease Mother    Stroke Mother    Hyperlipidemia Mother    Diabetes Maternal Aunt     Social History   Tobacco Use   Smoking status: Former    Types: Cigarettes    Quit date: 03/14/2013    Years since quitting: 7.7   Smokeless tobacco: Never   Tobacco comments:    1 cigarette1-2 times year, only when she gets stressed  Vaping Use   Vaping Use: Never used  Substance Use Topics   Alcohol use: Not Currently    Alcohol/week: 1.0 standard drink  Types: 1 Cans of beer per week    Comment: One can of beer every 2-3 months.    Drug use: No    Home Medications Prior to Admission medications   Medication Sig Start Date End Date Taking? Authorizing Provider  amLODipine (NORVASC) 10 MG tablet Take 1 tablet (10 mg total) by mouth daily. Patient not taking: Reported on 12/16/2020 11/26/20   Kathie Dike, MD  Blood Glucose Monitoring Suppl (TRUE METRIX METER) w/Device KIT 1 Device by Does not apply route 3 (three) times daily. For blood sugar checks Patient not taking: Reported on 12/16/2020 09/13/18   Barb Merino, MD  carvedilol (COREG) 12.5 MG tablet Take 1 tablet (12.5 mg total) by mouth 2 (two) times daily with a meal. 12/19/20   Swayze, Ava, DO  Ensure Max Protein (ENSURE MAX PROTEIN) LIQD Take 330 mLs (11 oz total) by mouth daily. 12/20/20   Swayze, Ava, DO  feeding supplement, GLUCERNA SHAKE, (GLUCERNA SHAKE) LIQD Take 237 mLs by mouth 3 (three) times daily between meals. 12/19/20   Swayze, Ava, DO  ferrous sulfate 325 (65 FE) MG tablet Take 1 tablet (325 mg total) by mouth daily. Patient not taking: Reported on 12/16/2020 11/26/20 11/26/21  Kathie Dike, MD  gabapentin (NEURONTIN) 100 MG capsule Take 1 capsule (100 mg total) by mouth 3 (three) times daily. Patient not taking: Reported on 12/16/2020 11/26/20 02/24/21  Kathie Dike, MD  glucose blood test strip Use as instructed Patient not taking: Reported on 12/16/2020 10/28/18   Argentina Donovan, PA-C  Insulin Syringe-Needle U-100 (SAFETY INSULIN SYRINGES) 30G X 5/16" 0.5 ML MISC 1 each by Does not apply route at bedtime. Patient not taking: Reported on 12/16/2020 10/21/19   Argentina Donovan, PA-C  metoCLOPramide (REGLAN) 5 MG tablet Take 1 tablet (5 mg total) by mouth 4 (four) times daily -  before meals and at bedtime. 12/19/20   Swayze, Ava, DO  Multiple Vitamins-Minerals (CERTAVITE/ANTIOXIDANTS) TABS Take 1 tablet by mouth daily. 12/20/20   Swayze, Ava, DO  ondansetron (ZOFRAN ODT) 4 MG disintegrating tablet Take 1 tablet (4 mg total) by mouth every 8 (eight) hours as needed for nausea or vomiting. 12/07/20   Isla Pence, MD  polyethylene glycol powder (GLYCOLAX/MIRALAX) 17 GM/SCOOP powder Take 17 g by mouth daily. 12/19/20   Swayze, Ava, DO  QUEtiapine (SEROQUEL) 100 MG tablet Take 1 tablet (100 mg total) by mouth at bedtime. For mood control Patient not taking: Reported on 12/16/2020 11/26/20   Kathie Dike, MD  risperiDONE (RISPERDAL) 1 MG tablet Take 1 tablet (1 mg total) by mouth at bedtime. Patient not taking: Reported on 12/16/2020  11/26/20   Kathie Dike, MD  sertraline (ZOLOFT) 50 MG tablet Take 1 tablet (50 mg total) by mouth daily. Patient not taking: Reported on 12/16/2020 11/26/20   Kathie Dike, MD  TRUEplus Lancets 28G MISC 1 each by Does not apply route 3 (three) times daily. For blood sugar checks Patient not taking: Reported on 12/16/2020 09/13/18   Barb Merino, MD  vitamin B-12 (CYANOCOBALAMIN) 1000 MCG tablet Take 1 tablet (1,000 mcg total) by mouth daily. Patient not taking: Reported on 12/16/2020 11/26/20   Kathie Dike, MD    Allergies    Lisinopril and Amoxicillin  Review of Systems   Review of Systems  Constitutional:  Positive for fatigue. Negative for chills and fever.  HENT:  Negative for congestion.   Eyes:  Negative for visual disturbance.  Respiratory:  Negative for shortness of breath.  Cardiovascular:  Negative for chest pain.  Gastrointestinal:  Positive for nausea and vomiting. Negative for abdominal pain and diarrhea.  Genitourinary:  Negative for dysuria.  Skin:  Negative for rash.  Neurological:  Negative for headaches.   Physical Exam Updated Vital Signs BP (!) 208/83   Pulse 66   Temp 98.9 F (37.2 C) (Oral)   Resp 17   Ht '5\' 2"'  (1.575 m)   Wt 52.2 kg   SpO2 100%   BMI 21.03 kg/m   Physical Exam Vitals and nursing note reviewed.  Constitutional:      General: She is not in acute distress.    Appearance: Normal appearance. She is not toxic-appearing or diaphoretic.  HENT:     Head: Normocephalic.     Mouth/Throat:     Mouth: Mucous membranes are moist.  Cardiovascular:     Rate and Rhythm: Normal rate.  Pulmonary:     Effort: Pulmonary effort is normal. No respiratory distress.  Abdominal:     Palpations: Abdomen is soft.     Tenderness: There is no abdominal tenderness. There is no guarding.  Skin:    General: Skin is warm.  Neurological:     Mental Status: She is alert and oriented to person, place, and time. Mental status is at baseline.   Psychiatric:        Mood and Affect: Mood normal.    ED Results / Procedures / Treatments   Labs (all labs ordered are listed, but only abnormal results are displayed) Labs Reviewed  CBC WITH DIFFERENTIAL/PLATELET - Abnormal; Notable for the following components:      Result Value   WBC 11.0 (*)    RBC 3.83 (*)    Hemoglobin 11.6 (*)    HCT 33.1 (*)    Neutro Abs 9.1 (*)    All other components within normal limits  COMPREHENSIVE METABOLIC PANEL - Abnormal; Notable for the following components:   Sodium 131 (*)    Chloride 97 (*)    Glucose, Bld 282 (*)    BUN 43 (*)    Creatinine, Ser 3.48 (*)    Calcium 8.8 (*)    Albumin 3.2 (*)    GFR, Estimated 14 (*)    All other components within normal limits  LIPASE, BLOOD    EKG None  Radiology No results found.  Procedures Procedures   Medications Ordered in ED Medications  metoCLOPramide (REGLAN) injection 10 mg (10 mg Intramuscular Given 12/21/20 0911)  hydrALAZINE (APRESOLINE) injection 10 mg (10 mg Intravenous Given 12/21/20 0856)  nitroGLYCERIN (NITROSTAT) SL tablet 0.4 mg (0.4 mg Sublingual Given 12/21/20 0856)    ED Course  I have reviewed the triage vital signs and the nursing notes.  Pertinent labs & imaging results that were available during my care of the patient were reviewed by me and considered in my medical decision making (see chart for details).    MDM Rules/Calculators/A&P                           60 year old female presents emergency department nausea/vomiting and hypertension.  Well-known to this department for previous complaints.  Was just discharged yesterday.  Went home and was noncompliant with medications.  Patient is hypertensive on arrival but vitals otherwise stable.  Abdomen is benign.  No active vomiting.  Blood work is baseline.  Patient was treated for nausea and hypertension.  Both of these improved, she was able to p.o. and plan  for outpatient follow-up.  Patient at this time  appears safe and stable for discharge and will be treated as an outpatient.  Discharge plan and strict return to ED precautions discussed, patient verbalizes understanding and agreement.  Final Clinical Impression(s) / ED Diagnoses Final diagnoses:  None    Rx / DC Orders ED Discharge Orders     None        Lorelle Gibbs, DO 12/21/20 1512

## 2020-12-21 NOTE — Progress Notes (Signed)
Inpatient Diabetes Program Recommendations  AACE/ADA: New Consensus Statement on Inpatient Glycemic Control (2015)  Target Ranges:  Prepandial:   less than 140 mg/dL      Peak postprandial:   less than 180 mg/dL (1-2 hours)      Critically ill patients:  140 - 180 mg/dL   Lab Results  Component Value Date   GLUCAP 259 (H) 12/20/2020   HGBA1C 8.3 (H) 11/19/2020    Review of Glycemic Control Results for Lorraine Turner, Lorraine Turner (MRN [id number]) as of 12/21/2020 10:04  Ref. Range 12/20/2020 06:26 12/20/2020 08:00 12/20/2020 11:32 12/20/2020 16:22  Glucose-Capillary Latest Ref Range: 70 - 99 mg/dL 174 (H) 164 (H) 186 (H) 259 (H)   Diabetes history: t2dm Outpatient Diabetes medications: none Current orders for Inpatient glycemic control: none  Inpatient Diabetes Program Recommendations:    If to remain inpatient consider adding Novolog 0-6 units q4h.  Thanks, Bronson Curb, MSN, RNC-OB Diabetes Coordinator [phone] (8a-5p)

## 2020-12-22 ENCOUNTER — Telehealth: Payer: Self-pay

## 2020-12-22 NOTE — Telephone Encounter (Signed)
Transition Care Management Unsuccessful Follow-up Telephone Call  Date of discharge and from where:   12/20/2020, [facility]  Attempts:  1st Attempt  Reason for unsuccessful TCM follow-up call:  Unable to reach pt or leave Voice message .

## 2020-12-25 ENCOUNTER — Telehealth: Payer: Self-pay

## 2020-12-25 NOTE — Telephone Encounter (Signed)
Transition Care Management Unsuccessful Follow-up Telephone Call  Date of discharge and from where:  12/20/2020 [facility]. Returned to ED 12/21/2020  Attempts:  2nd Attempt  Reason for unsuccessful TCM follow-up call:  Unable to leave message - voicemail not set up on # [phone].  Need to schedule hospital follow up appointment at [facility].

## 2020-12-26 ENCOUNTER — Telehealth: Payer: Self-pay

## 2020-12-26 NOTE — Telephone Encounter (Signed)
Transition Care Management Unsuccessful Follow-up Telephone Call  Date of discharge and from where:  12/20/2020,[facility]   Attempts:  3rd Attempt  Reason for unsuccessful TCM follow-up call:  Unable to leave message on # [phone], voicemail not set up. Call also placed to # [phone] and the recording stated that the call could not be completed at this time   Need to schedule hospital follow up appointment at [facility]

## 2021-01-02 ENCOUNTER — Other Ambulatory Visit: Payer: Self-pay

## 2021-01-02 ENCOUNTER — Emergency Department (HOSPITAL_COMMUNITY)
Admission: EM | Admit: 2021-01-02 | Discharge: 2021-01-03 | Disposition: A | Discharge status: Home / Self Care | Payer: Medicaid Other | Attending: Emergency Medicine | Admitting: Emergency Medicine

## 2021-01-02 ENCOUNTER — Encounter (HOSPITAL_COMMUNITY): Payer: Self-pay | Admitting: Emergency Medicine

## 2021-01-02 DIAGNOSIS — R1013 Epigastric pain: Secondary | ICD-10-CM | POA: Diagnosis present

## 2021-01-02 DIAGNOSIS — Z87891 Personal history of nicotine dependence: Secondary | ICD-10-CM | POA: Diagnosis not present

## 2021-01-02 DIAGNOSIS — I160 Hypertensive urgency: Secondary | ICD-10-CM | POA: Insufficient documentation

## 2021-01-02 DIAGNOSIS — Z20822 Contact with and (suspected) exposure to covid-19: Secondary | ICD-10-CM | POA: Diagnosis not present

## 2021-01-02 DIAGNOSIS — E1122 Type 2 diabetes mellitus with diabetic chronic kidney disease: Secondary | ICD-10-CM | POA: Diagnosis not present

## 2021-01-02 DIAGNOSIS — R112 Nausea with vomiting, unspecified: Secondary | ICD-10-CM

## 2021-01-02 DIAGNOSIS — N183 Chronic kidney disease, stage 3 unspecified: Secondary | ICD-10-CM | POA: Insufficient documentation

## 2021-01-02 DIAGNOSIS — Z794 Long term (current) use of insulin: Secondary | ICD-10-CM | POA: Insufficient documentation

## 2021-01-02 DIAGNOSIS — Z79899 Other long term (current) drug therapy: Secondary | ICD-10-CM | POA: Insufficient documentation

## 2021-01-02 DIAGNOSIS — Z8719 Personal history of other diseases of the digestive system: Secondary | ICD-10-CM

## 2021-01-02 DIAGNOSIS — K219 Gastro-esophageal reflux disease without esophagitis: Secondary | ICD-10-CM | POA: Diagnosis not present

## 2021-01-02 DIAGNOSIS — I129 Hypertensive chronic kidney disease with stage 1 through stage 4 chronic kidney disease, or unspecified chronic kidney disease: Secondary | ICD-10-CM | POA: Insufficient documentation

## 2021-01-02 LAB — COMPREHENSIVE METABOLIC PANEL
ALT: 12 U/L (ref 0–44)
AST: 12 U/L — ABNORMAL LOW (ref 15–41)
Albumin: 3 g/dL — ABNORMAL LOW (ref 3.5–5.0)
Alkaline Phosphatase: 53 U/L (ref 38–126)
Anion gap: 9 (ref 5–15)
BUN: 38 mg/dL — ABNORMAL HIGH (ref 6–20)
CO2: 26 mmol/L (ref 22–32)
Calcium: 8.5 mg/dL — ABNORMAL LOW (ref 8.9–10.3)
Chloride: 99 mmol/L (ref 98–111)
Creatinine, Ser: 3.65 mg/dL — ABNORMAL HIGH (ref 0.44–1.00)
GFR, Estimated: 14 mL/min — ABNORMAL LOW (ref >60)
Glucose, Bld: 202 mg/dL — ABNORMAL HIGH (ref 70–99)
Potassium: 3.6 mmol/L (ref 3.5–5.1)
Sodium: 134 mmol/L — ABNORMAL LOW (ref 135–145)
Total Bilirubin: 1.2 mg/dL (ref 0.3–1.2)
Total Protein: 6.2 g/dL — ABNORMAL LOW (ref 6.5–8.1)

## 2021-01-02 LAB — RESP PANEL BY RT-PCR (FLU A&B, COVID) ARPGX2
Influenza A by PCR: NEGATIVE
Influenza B by PCR: NEGATIVE
SARS Coronavirus 2 by RT PCR: NEGATIVE

## 2021-01-02 LAB — LIPASE, BLOOD: Lipase: 27 U/L (ref 11–51)

## 2021-01-02 MED ORDER — CARVEDILOL 12.5 MG PO TABS
12.5000 mg | ORAL_TABLET | Freq: Once | ORAL | Status: AC
  Administered 2021-01-02: 12.5 mg via ORAL
  Filled 2021-01-02: qty 1

## 2021-01-02 MED ORDER — AMLODIPINE BESYLATE 10 MG PO TABS: 10.0000 mg | ORAL_TABLET | Freq: Every day | ORAL | 1 refills | Status: DC

## 2021-01-02 MED ORDER — CARVEDILOL 12.5 MG PO TABS: 12.5000 mg | ORAL_TABLET | Freq: Two times a day (BID) | ORAL | 0 refills | Status: DC

## 2021-01-02 MED ORDER — AMLODIPINE BESYLATE 5 MG PO TABS
10.0000 mg | ORAL_TABLET | Freq: Once | ORAL | Status: AC
  Administered 2021-01-02: 10 mg via ORAL
  Filled 2021-01-02: qty 2

## 2021-01-02 MED ORDER — ALUM & MAG HYDROXIDE-SIMETH 200-200-20 MG/5ML PO SUSP
30.0000 mL | Freq: Once | ORAL | Status: AC
  Administered 2021-01-02: 30 mL via ORAL
  Filled 2021-01-02: qty 30

## 2021-01-02 MED ORDER — SODIUM CHLORIDE 0.9 % IV BOLUS
1000.0000 mL | Freq: Once | INTRAVENOUS | Status: AC
  Administered 2021-01-02: 1000 mL via INTRAVENOUS

## 2021-01-02 MED ORDER — LIDOCAINE VISCOUS HCL 2 % MT SOLN
15.0000 mL | Freq: Once | OROMUCOSAL | Status: AC
  Administered 2021-01-02: 15 mL via ORAL
  Filled 2021-01-02: qty 15

## 2021-01-02 MED ORDER — MYLANTA MAXIMUM STRENGTH 400-400-40 MG/5ML PO SUSP: 10.0000 mL | Freq: Four times a day (QID) | ORAL | 0 refills | Status: DC | PRN

## 2021-01-02 MED ORDER — ONDANSETRON HCL 4 MG/2ML IJ SOLN
4.0000 mg | Freq: Once | INTRAMUSCULAR | Status: AC
  Administered 2021-01-02: 4 mg via INTRAVENOUS
  Filled 2021-01-02: qty 2

## 2021-01-02 NOTE — ED Provider Notes (Signed)
[facility] EMERGENCY DEPARTMENT Provider Note   CSN: [id number] Arrival date & time: 01/02/21  1545     History Chief Complaint  Patient presents with   Abdominal Pain    Lorraine Turner is a 60 y.o. female.  The history is provided by the patient and medical records. No language interpreter was used.  Abdominal Pain   60 year old female significant history of diabetes, hyperlipidemia, hypertension, anxiety, intractable nausea vomiting, kidney disease, presenting with complaints of abdominal discomfort.  Patient report for more than 2 months she has had persistent nausea, epigastric discomfort, burning sensation, intermittent abdominal pain and vomiting.  She also endorsed not having a bowel movement for more than a month.  Able to pass flatus.  Symptoms moderate in severity, emesis sometimes with dark color.  Denies any fever chest pain shortness of breath dysuria or diarrhea.  Denies alcohol or tobacco use.  Denies marijuana use.  She does check her blood sugar on occasion.  She does have an appointment with a GI specialist in the next several days.  She has been seen and evaluated in the ED for same previously.  She has had prior cholecystectomy   Past Medical History:  Diagnosis Date   AKI (acute kidney injury) ([place]) 09/12/2018   Anxiety    Bipolar 1 disorder ([place])    Depression    Diabetes mellitus without complication ([place])    Diarrhea 11/21/2020   Gallstones    Hyperlipidemia    Hypertension    MDD (major depressive disorder), recurrent episode, moderate ([place]) 11/18/2020   Neuropathy    Schizophrenia ([place])    Seizures ([place])    X1- febrile seizure as a child-none since   Suicide ideation 09/12/2018    Patient Active Problem List   Diagnosis Date Noted   Intractable nausea and vomiting 12/15/2020   Hypokalemia 12/15/2020   UTI (urinary tract infection) 11/24/2020   Acute renal failure superimposed on stage 4 chronic kidney disease ([place])     Hypertensive urgency 09/01/2020   CKD (chronic kidney disease), stage III ([place]) 09/01/2020   DKA (diabetic ketoacidoses) 09/12/2018   Adjustment disorder with mixed disturbance of emotions and conduct    MDD (major depressive disorder), severe ([place]) 81/19/1478   Umbilical hernia with obstruction 11/24/2015   Symptomatic cholelithiasis 11/24/2015   Type 2 diabetes mellitus with hyperglycemia ([place])    Suicide attempt ([place]) 05/06/2014   Schizoaffective disorder, depressive type ([place]) 08/14/2011   UNSPECIFIED ANEMIA 01/23/2009   Primary hypertension 10/12/2008   HYPERCHOLESTEROLEMIA 01/01/2007   PANIC DISORDER 01/01/2007   Obsessive compulsive disorder 01/01/2007    Past Surgical History:  Procedure Laterality Date   CESAREAN SECTION  05/1998   X 1   CHOLECYSTECTOMY N/A 03/27/2016   Procedure: LAPAROSCOPIC CHOLECYSTECTOMY;  Surgeon: Olean Ree, MD;  Location: [facility] ORS;  Service: General;  Laterality: N/A;   TONSILLECTOMY AND ADENOIDECTOMY     age 11   TUBAL LIGATION       OB History   No obstetric history on file.     Family History  Problem Relation Age of Onset   Diabetes Mother    Heart disease Mother    Kidney disease Mother    Stroke Mother    Hyperlipidemia Mother    Diabetes Maternal Aunt     Social History   Tobacco Use   Smoking status: Former    Types: Cigarettes    Quit date: 03/14/2013    Years since quitting: 7.8  Smokeless tobacco: Never   Tobacco comments:    1 cigarette1-2 times year, only when she gets stressed  Vaping Use   Vaping Use: Never used  Substance Use Topics   Alcohol use: Not Currently    Alcohol/week: 1.0 standard drink    Types: 1 Cans of beer per week    Comment: One can of beer every 2-3 months.    Drug use: No    Home Medications Prior to Admission medications   Medication Sig Start Date End Date Taking? Authorizing Provider  amLODipine (NORVASC) 10 MG tablet Take 1 tablet (10 mg total) by mouth daily. Patient not taking:  No sig reported 11/26/20   Kathie Dike, MD  Blood Glucose Monitoring Suppl (TRUE METRIX METER) w/Device KIT 1 Device by Does not apply route 3 (three) times daily. For blood sugar checks Patient not taking: No sig reported 09/13/18   Barb Merino, MD  carvedilol (COREG) 12.5 MG tablet Take 1 tablet (12.5 mg total) by mouth 2 (two) times daily with a meal. 12/19/20   Swayze, Ava, DO  Ensure Max Protein (ENSURE MAX PROTEIN) LIQD Take 330 mLs (11 oz total) by mouth daily. Patient not taking: Reported on 12/21/2020 12/20/20   Swayze, Ava, DO  feeding supplement, GLUCERNA SHAKE, (GLUCERNA SHAKE) LIQD Take 237 mLs by mouth 3 (three) times daily between meals. Patient not taking: Reported on 12/21/2020 12/19/20   Swayze, Ava, DO  ferrous sulfate 325 (65 FE) MG tablet Take 1 tablet (325 mg total) by mouth daily. Patient not taking: No sig reported 11/26/20 11/26/21  Kathie Dike, MD  gabapentin (NEURONTIN) 100 MG capsule Take 1 capsule (100 mg total) by mouth 3 (three) times daily. Patient not taking: No sig reported 11/26/20 02/24/21  Kathie Dike, MD  glucose blood test strip Use as instructed Patient not taking: Reported on 12/16/2020 10/28/18   Argentina Donovan, PA-C  Insulin Syringe-Needle U-100 (SAFETY INSULIN SYRINGES) 30G X 5/16" 0.5 ML MISC 1 each by Does not apply route at bedtime. Patient not taking: No sig reported 10/21/19   Argentina Donovan, PA-C  metoCLOPramide (REGLAN) 5 MG tablet Take 1 tablet (5 mg total) by mouth 4 (four) times daily -  before meals and at bedtime. 12/19/20   Swayze, Ava, DO  Multiple Vitamins-Minerals (CERTAVITE/ANTIOXIDANTS) TABS Take 1 tablet by mouth daily. 12/20/20   Swayze, Ava, DO  ondansetron (ZOFRAN ODT) 4 MG disintegrating tablet Take 1 tablet (4 mg total) by mouth every 8 (eight) hours as needed for nausea or vomiting. 12/07/20   Isla Pence, MD  polyethylene glycol powder (GLYCOLAX/MIRALAX) 17 GM/SCOOP powder Take 17 g by mouth daily. Patient not  taking: Reported on 12/21/2020 12/19/20   Swayze, Ava, DO  QUEtiapine (SEROQUEL) 100 MG tablet Take 1 tablet (100 mg total) by mouth at bedtime. For mood control Patient not taking: No sig reported 11/26/20   Kathie Dike, MD  risperiDONE (RISPERDAL) 1 MG tablet Take 1 tablet (1 mg total) by mouth at bedtime. Patient not taking: No sig reported 11/26/20   Kathie Dike, MD  sertraline (ZOLOFT) 50 MG tablet Take 1 tablet (50 mg total) by mouth daily. Patient not taking: No sig reported 11/26/20   Kathie Dike, MD  TRUEplus Lancets 28G MISC 1 each by Does not apply route 3 (three) times daily. For blood sugar checks Patient not taking: No sig reported 09/13/18   Barb Merino, MD  vitamin B-12 (CYANOCOBALAMIN) 1000 MCG tablet Take 1 tablet (1,000 mcg total) by mouth daily.  Patient not taking: No sig reported 11/26/20   Kathie Dike, MD    Allergies    Lisinopril and Amoxicillin  Review of Systems   Review of Systems  Gastrointestinal:  Positive for abdominal pain.  All other systems reviewed and are negative.  Physical Exam Updated Vital Signs BP (!) 160/90 (BP Location: Left Arm)   Pulse (!) 101   Temp 99 F (37.2 C) (Oral)   Resp 16   SpO2 100%   Physical Exam Vitals and nursing note reviewed.  Constitutional:      General: She is not in acute distress.    Appearance: She is well-developed.  HENT:     Head: Atraumatic.  Eyes:     Conjunctiva/sclera: Conjunctivae normal.  Cardiovascular:     Rate and Rhythm: Normal rate and regular rhythm.  Pulmonary:     Effort: Pulmonary effort is normal.  Abdominal:     General: Abdomen is flat.     Palpations: Abdomen is soft.     Tenderness: There is abdominal tenderness (Diffuse tenderness no guarding or rebound tenderness.  Bowel sounds present).  Musculoskeletal:     Cervical back: Neck supple.  Skin:    Findings: No rash.  Neurological:     Mental Status: She is alert.  Psychiatric:        Mood and Affect: Mood  normal.    ED Results / Procedures / Treatments   Labs (all labs ordered are listed, but only abnormal results are displayed) Labs Reviewed  COMPREHENSIVE METABOLIC PANEL - Abnormal; Notable for the following components:      Result Value   Sodium 134 (*)    Glucose, Bld 202 (*)    BUN 38 (*)    Creatinine, Ser 3.65 (*)    Calcium 8.5 (*)    Total Protein 6.2 (*)    Albumin 3.0 (*)    AST 12 (*)    GFR, Estimated 14 (*)    All other components within normal limits  RESP PANEL BY RT-PCR (FLU A&B, COVID) ARPGX2  LIPASE, BLOOD  CBC WITH DIFFERENTIAL/PLATELET  URINALYSIS, ROUTINE W REFLEX MICROSCOPIC    EKG None  Radiology No results found.  Procedures Procedures   Medications Ordered in ED Medications  alum & mag hydroxide-simeth (MAALOX/MYLANTA) 200-200-20 MG/5ML suspension 30 mL (30 mLs Oral Given 01/02/21 1945)    And  lidocaine (XYLOCAINE) 2 % viscous mouth solution 15 mL (15 mLs Oral Given 01/02/21 1945)  sodium chloride 0.9 % bolus 1,000 mL (0 mLs Intravenous Stopped 01/02/21 2151)  ondansetron (ZOFRAN) injection 4 mg (4 mg Intravenous Given 01/02/21 1944)  amLODipine (NORVASC) tablet 10 mg (10 mg Oral Given 01/02/21 2011)  carvedilol (COREG) tablet 12.5 mg (12.5 mg Oral Given 01/02/21 2010)    ED Course  I have reviewed the triage vital signs and the nursing notes.  Pertinent labs & imaging results that were available during my care of the patient were reviewed by me and considered in my medical decision making (see chart for details).    MDM Rules/Calculators/A&P                           BP 124/69   Pulse 75   Temp 99.2 F (37.3 C)   Resp 16   SpO2 98%   Final Clinical Impression(s) / ED Diagnoses Final diagnoses:  H/O gastroesophageal reflux (GERD)  Nausea and vomiting, unspecified vomiting type  Hypertensive urgency    Rx /  DC Orders ED Discharge Orders          Ordered    alum & mag hydroxide-simeth (MYLANTA MAXIMUM STRENGTH)  937-342-87 MG/5ML suspension  Every 6 hours PRN        01/02/21 2258    amLODipine (NORVASC) 10 MG tablet  Daily        01/02/21 2258    carvedilol (COREG) 12.5 MG tablet  2 times daily with meals        01/02/21 2258           6:42 PM Patient here with indigestion, burning sensation to her epigastric region worsening with eating as well as not having a bowel movement for the past month.  She has a nondistended abdomen, bowel sounds present on exam it is mildly tender diffusely.  Patient has been seen evaluated for similar complaint, has had several abdominal pelvic CT scan done for same, most recent was 2 weeks ago and it was normal.  We will check labs, and will provide symptom control.  10:55 PM Initially blood pressure is markedly elevated.  Patient admits that she have not been taking her blood pressure medication.  After receiving her usual home dose of blood pressure medication, blood pressure is significantly improved and now normalized.  Patient with giving symptomatic control for her symptoms and at this time she felt much better.  She report that she noticed moderate improvement with Maalox/Mylanta.  Will discharge home with symptom control   Domenic Moras, PA-C 01/02/21 2259    Truddie Hidden, MD 01/02/21 [phone]

## 2021-01-02 NOTE — Discharge Instructions (Signed)
Your symptoms are likely due to heart burn.  Take medication prescribed.  Your blood pressure is high today, take blood pressure medications and follow up with your doctor for recheck.

## 2021-01-02 NOTE — ED Triage Notes (Signed)
Pt c/o abdominal pain, nausea/vomiting x 2 months. Denies diarrhea, states she has not had a BM x 1 month.

## 2021-01-02 NOTE — ED Notes (Signed)
Pt states she has ride to pick her up - pt changing into clothes at this time

## 2021-01-02 NOTE — ED Notes (Signed)
MD and PA notified of pt BP

## 2021-01-03 NOTE — ED Notes (Signed)
Patient verbalizes understanding of discharge instructions. Opportunity for questioning and answers were provided. Armband removed by staff, pt discharged from ED via wheelchair.  

## 2021-01-03 NOTE — ED Notes (Signed)
Pt given clean pair of scrubs to change into but does not wish to change into them at this time

## 2021-09-06 ENCOUNTER — Encounter (HOSPITAL_BASED_OUTPATIENT_CLINIC_OR_DEPARTMENT_OTHER): Payer: Self-pay | Admitting: Emergency Medicine

## 2021-09-06 ENCOUNTER — Other Ambulatory Visit: Payer: Self-pay

## 2021-09-06 ENCOUNTER — Emergency Department (HOSPITAL_BASED_OUTPATIENT_CLINIC_OR_DEPARTMENT_OTHER)
Admission: EM | Admit: 2021-09-06 | Discharge: 2021-09-06 | Disposition: A | Discharge status: Home / Self Care | Payer: Medicaid Other | Attending: Emergency Medicine | Admitting: Emergency Medicine

## 2021-09-06 ENCOUNTER — Other Ambulatory Visit (HOSPITAL_BASED_OUTPATIENT_CLINIC_OR_DEPARTMENT_OTHER): Payer: Self-pay

## 2021-09-06 DIAGNOSIS — R300 Dysuria: Secondary | ICD-10-CM | POA: Diagnosis present

## 2021-09-06 DIAGNOSIS — R030 Elevated blood-pressure reading, without diagnosis of hypertension: Secondary | ICD-10-CM | POA: Insufficient documentation

## 2021-09-06 DIAGNOSIS — X17XXXA Contact with hot engines, machinery and tools, initial encounter: Secondary | ICD-10-CM | POA: Insufficient documentation

## 2021-09-06 DIAGNOSIS — T24232A Burn of second degree of left lower leg, initial encounter: Secondary | ICD-10-CM | POA: Diagnosis not present

## 2021-09-06 DIAGNOSIS — N390 Urinary tract infection, site not specified: Secondary | ICD-10-CM | POA: Insufficient documentation

## 2021-09-06 DIAGNOSIS — Z4800 Encounter for change or removal of nonsurgical wound dressing: Secondary | ICD-10-CM | POA: Insufficient documentation

## 2021-09-06 DIAGNOSIS — Z79899 Other long term (current) drug therapy: Secondary | ICD-10-CM | POA: Insufficient documentation

## 2021-09-06 LAB — URINALYSIS, ROUTINE W REFLEX MICROSCOPIC
Bilirubin Urine: NEGATIVE
Glucose, UA: NEGATIVE mg/dL
Ketones, ur: NEGATIVE mg/dL
Nitrite: NEGATIVE
Protein, ur: >=300 mg/dL — AB
Specific Gravity, Urine: 1.02 (ref 1.005–1.030)
pH: 6 (ref 5.0–8.0)

## 2021-09-06 LAB — URINALYSIS, MICROSCOPIC (REFLEX): WBC, UA: >50 WBC/hpf (ref 0–5)

## 2021-09-06 MED ORDER — CEPHALEXIN 500 MG PO CAPS
500.0000 mg | ORAL_CAPSULE | Freq: Four times a day (QID) | ORAL | 0 refills | Status: DC
  Filled 2021-09-06: qty 20, 5d supply, fill #0

## 2021-09-06 MED ORDER — AMLODIPINE BESYLATE 10 MG PO TABS: 10.0000 mg | ORAL_TABLET | Freq: Every day | ORAL | 0 refills | Status: DC

## 2021-09-06 MED ORDER — AMLODIPINE BESYLATE 10 MG PO TABS
10.0000 mg | ORAL_TABLET | Freq: Every day | ORAL | 0 refills | Status: DC
  Filled 2021-09-06: qty 30, 30d supply, fill #0

## 2021-09-06 MED ORDER — CEPHALEXIN 500 MG PO CAPS: 500.0000 mg | ORAL_CAPSULE | Freq: Four times a day (QID) | ORAL | 0 refills | Status: DC

## 2021-09-06 MED ORDER — AMLODIPINE BESYLATE 5 MG PO TABS
10.0000 mg | ORAL_TABLET | Freq: Once | ORAL | Status: AC
  Administered 2021-09-06: 10 mg via ORAL
  Filled 2021-09-06: qty 2

## 2021-09-06 NOTE — ED Notes (Signed)
Pt states  she stopped taking her bp meds due to she found out they  may have risk for cancer  x 1 year
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
## 2021-09-08 LAB — URINE CULTURE: Culture: >=100000 — AB

## 2021-09-09 ENCOUNTER — Telehealth (HOSPITAL_BASED_OUTPATIENT_CLINIC_OR_DEPARTMENT_OTHER): Payer: Self-pay | Admitting: *Deleted

## 2021-09-09 NOTE — Telephone Encounter (Signed)
Post ED Visit - Positive Culture Follow-up  Culture report reviewed by antimicrobial stewardship pharmacist: [facility] Team []  Elenor Quinones, Pharm.D. []  Heide Guile, Pharm.D., BCPS AQ-ID []  Parks Neptune, Pharm.D., BCPS []  Alycia Rossetti, Pharm.D., BCPS []  [place], Pharm.D., BCPS, AAHIVP []  Legrand Como, Pharm.D., BCPS, AAHIVP []  Salome Arnt, PharmD, BCPS []  Johnnette Gourd, PharmD, BCPS []  Hughes Better, PharmD, BCPS []  Leeroy Cha, PharmD []  Laqueta Linden, PharmD, BCPS [x]  Lorelei Pont, PharmD  [facility] Team []  Leodis Sias, PharmD []  Lindell Spar, PharmD []  Royetta Asal, PharmD []  Graylin Shiver, Rph []  Rema Fendt) Glennon Mac, PharmD []  Arlyn Dunning, PharmD []  Netta Cedars, PharmD []  Dia Sitter, PharmD []  Leone Haven, PharmD []  Gretta Arab, PharmD []  Theodis Shove, PharmD []  Peggyann Juba, PharmD []  Reuel Boom, PharmD   Positive urine culture Treated with Cephalexin, organism sensitive to the same and no further patient follow-up is required at this time.  Rosie Fate 09/09/2021, 12:39 PM

## 2021-10-06 ENCOUNTER — Encounter (HOSPITAL_BASED_OUTPATIENT_CLINIC_OR_DEPARTMENT_OTHER): Payer: Self-pay | Admitting: Emergency Medicine

## 2021-10-06 ENCOUNTER — Other Ambulatory Visit: Payer: Self-pay

## 2021-10-06 ENCOUNTER — Emergency Department (HOSPITAL_BASED_OUTPATIENT_CLINIC_OR_DEPARTMENT_OTHER)
Admission: EM | Admit: 2021-10-06 | Discharge: 2021-10-06 | Disposition: A | Discharge status: Home / Self Care | Payer: Medicaid Other | Attending: Emergency Medicine | Admitting: Emergency Medicine

## 2021-10-06 DIAGNOSIS — I10 Essential (primary) hypertension: Secondary | ICD-10-CM | POA: Diagnosis not present

## 2021-10-06 DIAGNOSIS — Z4800 Encounter for change or removal of nonsurgical wound dressing: Secondary | ICD-10-CM | POA: Insufficient documentation

## 2021-10-06 DIAGNOSIS — Z5189 Encounter for other specified aftercare: Secondary | ICD-10-CM

## 2021-10-06 DIAGNOSIS — E119 Type 2 diabetes mellitus without complications: Secondary | ICD-10-CM | POA: Insufficient documentation

## 2021-10-06 DIAGNOSIS — Z79899 Other long term (current) drug therapy: Secondary | ICD-10-CM | POA: Insufficient documentation

## 2021-10-06 DIAGNOSIS — Z794 Long term (current) use of insulin: Secondary | ICD-10-CM | POA: Diagnosis not present

## 2021-10-06 DIAGNOSIS — Z23 Encounter for immunization: Secondary | ICD-10-CM | POA: Insufficient documentation

## 2021-10-06 MED ORDER — SODIUM CHLORIDE 0.9 % IV BOLUS: 500.0000 mL | Freq: Once | INTRAVENOUS | Status: DC

## 2021-10-06 MED ORDER — BACITRACIN ZINC 500 UNIT/GM EX OINT: 1.0000 | TOPICAL_OINTMENT | Freq: Two times a day (BID) | CUTANEOUS | 0 refills | Status: DC

## 2021-10-06 MED ORDER — BACITRACIN ZINC 500 UNIT/GM EX OINT
TOPICAL_OINTMENT | Freq: Two times a day (BID) | CUTANEOUS | Status: DC
  Filled 2021-10-06: qty 28.35

## 2021-10-06 MED ORDER — AMLODIPINE BESYLATE 5 MG PO TABS
10.0000 mg | ORAL_TABLET | Freq: Once | ORAL | Status: AC
  Administered 2021-10-06: 10 mg via ORAL
  Filled 2021-10-06: qty 2

## 2021-10-06 MED ORDER — TETANUS-DIPHTH-ACELL PERTUSSIS 5-2.5-18.5 LF-MCG/0.5 IM SUSY
0.5000 mL | PREFILLED_SYRINGE | Freq: Once | INTRAMUSCULAR | Status: AC
  Administered 2021-10-06: 0.5 mL via INTRAMUSCULAR
  Filled 2021-10-06: qty 0.5

## 2021-10-06 NOTE — ED Notes (Signed)
Denies any s/s of hypertension will continue to monitor

## 2021-10-06 NOTE — ED Notes (Signed)
Patient denies any s/s of hypertension. 
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
## 2021-10-06 NOTE — ED Notes (Signed)
Patient refused saline bolus notified Provider .

## 2021-10-06 NOTE — ED Notes (Signed)
Patient is alert x4 Has wound left lower leg  Wound is pink in color with a scab over part of it . Patient can bear weight on leg

## 2021-10-06 NOTE — ED Notes (Signed)
Patient blood pressure was elevated denies any headaches ,chest pain or any other s/s of hypertesion

## 2021-10-06 NOTE — ED Notes (Signed)
ED Provider at bedside. 
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
## 2021-10-06 NOTE — Discharge Instructions (Signed)
Note the work-up today was overall reassuring.  Your wound looks like it is healing well.  I will prescribe a topical antibiotic to place on the wound twice a day.  Make sure you are continuing to use nonstick bandages.  If you notice that the nonstick bandages are scabbing up in the wound, this means that you need to change the bandages more frequently.  I suggest changing the bandages at least twice a day.  You can wash area with warm soapy water while you are in the shower.  I suggest using unscented Dove soap as it has the least likelihood of causing burning sensation.  I have attached information for a wound care center in [place] to call to set up an appointment.  I will also get back in with your primary care provider for addressing diabetic medications as well as blood pressure medication.  Your blood pressure was very elevated today while in the emergency department.  It would be beneficial to overall health if your blood pressure and blood glucose levels were under control.  Outside of an appoint with your primary care provider as soon as you are able.  Please do not hesitate to return to the emergency department if the worrisome signs and symptoms we discussed become apparent.

## 2021-10-13 ENCOUNTER — Ambulatory Visit (HOSPITAL_COMMUNITY)
Admission: EM | Admit: 2021-10-13 | Discharge: 2021-10-15 | Disposition: A | Discharge status: Other Acute Inpatient Hospital | Payer: Medicaid Other | Attending: Family | Admitting: Family

## 2021-10-13 DIAGNOSIS — F259 Schizoaffective disorder, unspecified: Secondary | ICD-10-CM | POA: Insufficient documentation

## 2021-10-13 DIAGNOSIS — Z20822 Contact with and (suspected) exposure to covid-19: Secondary | ICD-10-CM | POA: Insufficient documentation

## 2021-10-13 DIAGNOSIS — F322 Major depressive disorder, single episode, severe without psychotic features: Secondary | ICD-10-CM | POA: Diagnosis not present

## 2021-10-13 DIAGNOSIS — T1491XS Suicide attempt, sequela: Secondary | ICD-10-CM | POA: Diagnosis not present

## 2021-10-13 DIAGNOSIS — X838XXS Intentional self-harm by other specified means, sequela: Secondary | ICD-10-CM | POA: Insufficient documentation

## 2021-10-13 DIAGNOSIS — F251 Schizoaffective disorder, depressive type: Secondary | ICD-10-CM

## 2021-10-13 DIAGNOSIS — T1491XA Suicide attempt, initial encounter: Secondary | ICD-10-CM

## 2021-10-13 LAB — POCT URINE DRUG SCREEN - MANUAL ENTRY (I-SCREEN)
POC Amphetamine UR: NOT DETECTED
POC Buprenorphine (BUP): NOT DETECTED
POC Cocaine UR: NOT DETECTED
POC Marijuana UR: NOT DETECTED
POC Methadone UR: NOT DETECTED
POC Methamphetamine UR: NOT DETECTED
POC Morphine: NOT DETECTED
POC Oxazepam (BZO): NOT DETECTED
POC Oxycodone UR: NOT DETECTED
POC Secobarbital (BAR): NOT DETECTED

## 2021-10-13 MED ORDER — ONDANSETRON 4 MG PO TBDP
4.0000 mg | ORAL_TABLET | Freq: Once | ORAL | Status: DC
  Filled 2021-10-13: qty 1

## 2021-10-13 MED ORDER — MAGNESIUM HYDROXIDE 400 MG/5ML PO SUSP: 30.0000 mL | Freq: Every day | ORAL | Status: DC | PRN

## 2021-10-13 MED ORDER — TRAZODONE HCL 50 MG PO TABS: 50.0000 mg | ORAL_TABLET | Freq: Every evening | ORAL | Status: DC | PRN

## 2021-10-13 MED ORDER — ALUM & MAG HYDROXIDE-SIMETH 200-200-20 MG/5ML PO SUSP: 30.0000 mL | ORAL | Status: DC | PRN

## 2021-10-13 MED ORDER — QUETIAPINE FUMARATE 100 MG PO TABS
100.0000 mg | ORAL_TABLET | Freq: Two times a day (BID) | ORAL | Status: DC
Start: 2021-10-13 — End: 2021-10-14
  Administered 2021-10-13 – 2021-10-14 (×2): 100 mg via ORAL
  Filled 2021-10-13 (×2): qty 1

## 2021-10-13 MED ORDER — AMLODIPINE BESYLATE 5 MG PO TABS
5.0000 mg | ORAL_TABLET | Freq: Once | ORAL | Status: AC
  Administered 2021-10-13: 5 mg via ORAL
  Filled 2021-10-13: qty 1

## 2021-10-13 MED ORDER — FLUTICASONE PROPIONATE 50 MCG/ACT NA SUSP
1.0000 | Freq: Every day | NASAL | Status: DC | PRN
Start: 2021-10-13 — End: 2021-10-15
  Administered 2021-10-13: 1 via NASAL
  Filled 2021-10-13: qty 16

## 2021-10-13 MED ORDER — ACETAMINOPHEN 325 MG PO TABS: 650.0000 mg | ORAL_TABLET | Freq: Four times a day (QID) | ORAL | Status: DC | PRN

## 2021-10-13 MED ORDER — GABAPENTIN 100 MG PO CAPS
100.0000 mg | ORAL_CAPSULE | Freq: Two times a day (BID) | ORAL | Status: DC
  Administered 2021-10-13 – 2021-10-14 (×2): 100 mg via ORAL
  Filled 2021-10-13 (×2): qty 1

## 2021-10-13 NOTE — BH Assessment (Signed)
Per [name] Ala, NP, patient meets criteria for inpatient psychiatric treatment. Patient will need in patient placement.   CCA to be completed by TTS staff.

## 2021-10-13 NOTE — ED Triage Notes (Signed)
Pt presents to [facility] voluntarily escorted by [name] due to ongoing SI with a plan. Per [name] [place] and mentioned SI. Pt states she got into an argument with her daughter lastnight and her daughter said she was done with her. Pt states this hurt her feelings and she decided to go to the railroad tracks to end things. Pt denies HI and AVH.

## 2021-10-13 NOTE — BH Assessment (Signed)
Per chart review, screening/triage complete by Renelda Loma, NT. She was identified as Emergent.   Clinician and [facility] provider collaboratively attempted to see patient. As we approached patient to complete the CCA and MSE, observed 3 more vomiting episodes back to back. Vomit is on the floor at present.    Clinician unable to complete CCA due to active vomiting episodes. Patient's disposition to be determined by the [facility] provider [name] Ala, NP).

## 2021-10-13 NOTE — ED Notes (Signed)
Pt sleeping@this time. Breathing even and unlabored. Will continue to monitor for safety 

## 2021-10-13 NOTE — ED Provider Notes (Incomplete)
[facility] Urgent Care Continuous Assessment Admission H&P  Date: 10/14/21 Patient Name: Lorraine Turner MRN: [id number] Chief Complaint:  Chief Complaint  Patient presents with   Suicidal      Diagnoses:  Final diagnoses:  MDD (major depressive disorder), severe ([place])  Suicide attempt [facility])    HPI: Lorraine Turner  is a 61 year African American female presents to [facility] urgent care accompanied by [employer]  Was reported that she got into a verbal altercation between she and her daughter which she reports feeling worthless and states she had thoughts of wanting to harm herself.  [name] reported she was found on the train tracks this evening.  During the intake admission process patient noted to have active vomiting.   She reports previous inpatient admission 2016 for suicide attempt.  States she has been taking medications as indicated.  Chart review patient has a history of adjustment disorder, major depressive disorder schizoaffective disorder and generalized anxiety disorder.  Currently prescribed Seroquel, Risperdal and Gabapentin, however it was reported patient hasn't been taken medication as directed.  Reported drinking 2 cans of twisted iced tea prior to her arrival. UDS-   Lorraine Turner has a charted history with panic disorder, obsessive-compulsive disorder, schizoaffective disorder, suicidal attempts previously.  Has not been compliant with psychiatric medications.will recommend inpatient admission.  During evaluation Lorraine Turner is sitting tearful, flat and depressed in no acute distress. She is alert/oriented x 4; calm/cooperative; and mood congruent with affect.She is speaking in a clear tone at moderate volume, and normal pace; with good eye contact. Her thought process is coherent and relevant; There is no indication that she is currently responding to internal/external stimuli or experiencing delusional thought content; and she is currently denying  suicidal/self-harm/homicidal ideation, psychosis, and paranoia.  Patient has remained calm throughout assessment and has answered questions appropriately.     PHQ 2-9:  Lorraine Turner from 10/25/2020 in [place] from 10/04/2020 in [facility] ED from 09/26/2020 in [place] DEPT  Thoughts that you would be better off dead, or of hurting yourself in some way -- Not at all Several days  PHQ-9 Total Score 1 4 14        Flowsheet Row ED from 10/13/2021 in [facility] ED from 10/06/2021 in [facility] ED from 01/02/2021 in [place] High Risk No Risk No Risk        Total Time spent with patient: 15 minutes  Musculoskeletal  Strength & Muscle Tone: within normal limits Gait & Station: normal Patient leans: N/A  Psychiatric Specialty Exam  Presentation General Appearance: Disheveled  Eye Contact:Good  Speech:Clear and Coherent  Speech Volume:Normal  Handedness:Right   Mood and Affect  Mood:Anxious  Affect:Congruent   Thought Process  Thought Processes:Coherent  Descriptions of Associations:Intact  Orientation:Full (Time, Place and Person)  Thought Content:Logical  Diagnosis of Schizophrenia or Schizoaffective disorder in past: Yes   Hallucinations:Hallucinations: None  Ideas of Reference:None  Suicidal Thoughts:Suicidal Thoughts: Yes, Passive SI Passive Intent and/or Plan: With Intent  Homicidal Thoughts:Homicidal Thoughts: No   Sensorium  Memory:Recent Poor; Immediate Good  Judgment:Fair  Insight:Fair   Executive Functions  Concentration:Good  Attention Span:Fair  [place]  Language:Good   Psychomotor Activity  Psychomotor Activity:Psychomotor Activity:  Normal   Assets  Assets:Desire for Improvement; Resilience; Social Support  Sleep  Sleep:Sleep: Fair   Nutritional Assessment (For OBS and FBC admissions only) Has the patient had a weight loss or gain of 10 pounds or more in the last 3 months?: No Has the patient had a decrease in food intake/or appetite?: No Does the patient have dental problems?: No Does the patient have eating habits or behaviors that may be indicators of an eating disorder including binging or inducing vomiting?: No Has the patient recently lost weight without trying?: 0 Has the patient been eating poorly because of a decreased appetite?: 0 Malnutrition Screening Tool Score: 0    Physical Exam Vitals and nursing note reviewed.  Cardiovascular:     Rate and Rhythm: Normal rate and regular rhythm.  Pulmonary:     Effort: Pulmonary effort is normal.  Skin:    General: Skin is warm.  Neurological:     Mental Status: She is alert and oriented to person, place, and time.  Psychiatric:        Mood and Affect: Mood normal.        Thought Content: Thought content normal.    Review of Systems  Constitutional: Negative.   HENT: Negative.    Eyes: Negative.   Gastrointestinal:  Positive for nausea and vomiting.  Skin: Negative.   Endo/Heme/Allergies: Negative.   Psychiatric/Behavioral:  Positive for depression, substance abuse and suicidal ideas. The patient is nervous/anxious.     Blood pressure (!) 144/66, pulse 65, temperature 98.6 F (37 C), temperature source Oral, resp. rate 16, SpO2 100 %. There is no height or weight on file to calculate BMI.  Past Psychiatric History:   Is the patient at risk to self? No  Has the patient been a risk to self in the past 6 months? No .    Has the patient been a risk to self within the distant past? No   Is the patient a risk to others? No   Has the patient been a risk to others in the past 6 months? No   Has the patient been a risk to others within the  distant past? No   Past Medical History:  Past Medical History:  Diagnosis Date   AKI (acute kidney injury) ([place]) 09/12/2018   Anxiety    Bipolar 1 disorder ([place])    Depression    Diabetes mellitus without complication ([place])    Diarrhea 11/21/2020   Gallstones    Hyperlipidemia    Hypertension    MDD (major depressive disorder), recurrent episode, moderate ([place]) 11/18/2020   Neuropathy    Schizophrenia ([place])    Seizures (HCC)    X1- febrile seizure as a child-none since   Suicide ideation 09/12/2018    Past Surgical History:  Procedure Laterality Date   CESAREAN SECTION  05/1998   X 1   CHOLECYSTECTOMY N/A 03/27/2016   Procedure: LAPAROSCOPIC CHOLECYSTECTOMY;  Surgeon: Olean Ree, MD;  Location: [facility] ORS;  Service: General;  Laterality: N/A;   TONSILLECTOMY AND ADENOIDECTOMY     age 2   TUBAL LIGATION      Family History:  Family History  Problem Relation Age of Onset   Diabetes Mother    Heart disease Mother    Kidney disease Mother    Stroke Mother    Hyperlipidemia Mother    Diabetes Maternal Aunt     Social History:  Social History   Socioeconomic History   Marital status: Single    Spouse name: Not on file   Number of children: Not on  file   Years of education: Not on file   Highest education level: Not on file  Occupational History   Not on file  Tobacco Use   Smoking status: Former    Types: Cigarettes    Quit date: 03/14/2013    Years since quitting: 8.5   Smokeless tobacco: Never   Tobacco comments:    1 cigarette1-2 times year, only when she gets stressed  Vaping Use   Vaping Use: Never used  Substance and Sexual Activity   Alcohol use: Not Currently    Alcohol/week: 1.0 standard drink of alcohol    Types: 1 Cans of beer per week    Comment: One can of beer every 2-3 months.    Drug use: No   Sexual activity: Not Currently  Other Topics Concern   Not on file  Social History Narrative   ** Merged History Encounter **       Social  Determinants of Health   Financial Resource Strain: Not on file  Food Insecurity: Not on file  Transportation Needs: Not on file  Physical Activity: Not on file  Stress: Not on file  Social Connections: Not on file  Intimate Partner Violence: Not on file    SDOH:  SDOH Screenings   Alcohol Screen: Low Risk  (11/17/2020)  Depression (PHQ2-9): Low Risk  (10/25/2020)  Recent Concern: Depression (PHQ2-9) - Medium Risk (09/26/2020)  Tobacco Use: Medium Risk (10/06/2021)    Last Labs:  Admission on 10/13/2021  Component Date Value Ref Range Status   SARS Coronavirus 2 by RT PCR 10/13/2021 NEGATIVE  NEGATIVE Final   Comment: (NOTE) SARS-CoV-2 target nucleic acids are NOT DETECTED.  The SARS-CoV-2 RNA is generally detectable in upper respiratory specimens during the acute phase of infection. The lowest concentration of SARS-CoV-2 viral copies this assay can detect is 138 copies/mL. A negative result does not preclude SARS-Cov-2 infection and should not be used as the sole basis for treatment or other patient management decisions. A negative result may occur with  improper specimen collection/handling, submission of specimen other than nasopharyngeal swab, presence of viral mutation(s) within the areas targeted by this assay, and inadequate number of viral copies(<138 copies/mL). A negative result must be combined with clinical observations, patient history, and epidemiological information. The expected result is Negative.  Fact Sheet for Patients:  EntrepreneurPulse.com.au  Fact Sheet for Healthcare Providers:  IncredibleEmployment.be  This test is no                          t yet approved or cleared by the [place] FDA and  has been authorized for detection and/or diagnosis of SARS-CoV-2 by FDA under an Emergency Use Authorization (EUA). This EUA will remain  in effect (meaning this test can be used) for the duration of the COVID-19  declaration under Section 564(b)(1) of the Act, 21 U.S.C.section 360bbb-3(b)(1), unless the authorization is terminated  or revoked sooner.       Influenza A by PCR 10/13/2021 NEGATIVE  NEGATIVE Final   Influenza B by PCR 10/13/2021 NEGATIVE  NEGATIVE Final   Comment: (NOTE) The Xpert Xpress SARS-CoV-2/FLU/RSV plus assay is intended as an aid in the diagnosis of influenza from Nasopharyngeal swab specimens and should not be used as a sole basis for treatment. Nasal washings and aspirates are unacceptable for Xpert Xpress SARS-CoV-2/FLU/RSV testing.  Fact Sheet for Patients: EntrepreneurPulse.com.au  Fact Sheet for Healthcare Providers: IncredibleEmployment.be  This test is not yet approved  or cleared by the [place] and has been authorized for detection and/or diagnosis of SARS-CoV-2 by FDA under an Emergency Use Authorization (EUA). This EUA will remain in effect (meaning this test can be used) for the duration of the COVID-19 declaration under Section 564(b)(1) of the Act, 21 U.S.C. section 360bbb-3(b)(1), unless the authorization is terminated or revoked.  Performed at [facility], [place] [address], [place], [place] [address]    WBC 10/13/2021 5.7  4.0 - 10.5 K/uL Final   RBC 10/13/2021 3.21 (L)  3.87 - 5.11 MIL/uL Final   Hemoglobin 10/13/2021 9.7 (L)  12.0 - 15.0 g/dL Final   HCT 10/13/2021 28.8 (L)  36.0 - 46.0 % Final   MCV 10/13/2021 89.7  80.0 - 100.0 fL Final   MCH 10/13/2021 30.2  26.0 - 34.0 pg Final   MCHC 10/13/2021 33.7  30.0 - 36.0 g/dL Final   RDW 10/13/2021 12.5  11.5 - 15.5 % Final   Platelets 10/13/2021 268  150 - 400 K/uL Final   nRBC 10/13/2021 0.0  0.0 - 0.2 % Final   Neutrophils Relative % 10/13/2021 62  % Final   Neutro Abs 10/13/2021 3.5  1.7 - 7.7 K/uL Final   Lymphocytes Relative 10/13/2021 25  % Final   Lymphs Abs 10/13/2021 1.4  0.7 - 4.0 K/uL Final   Monocytes Relative 10/13/2021 9  % Final    Monocytes Absolute 10/13/2021 0.5  0.1 - 1.0 K/uL Final   Eosinophils Relative 10/13/2021 2  % Final   Eosinophils Absolute 10/13/2021 0.1  0.0 - 0.5 K/uL Final   Basophils Relative 10/13/2021 1  % Final   Basophils Absolute 10/13/2021 0.1  0.0 - 0.1 K/uL Final   Immature Granulocytes 10/13/2021 1  % Final   Abs Immature Granulocytes 10/13/2021 0.04  0.00 - 0.07 K/uL Final   Performed at [facility], [place] [address] [place], [place] [address]   Sodium 10/13/2021 141  135 - 145 mmol/L Final   Potassium 10/13/2021 3.9  3.5 - 5.1 mmol/L Final   Chloride 10/13/2021 100  98 - 111 mmol/L Final   CO2 10/13/2021 25  22 - 32 mmol/L Final   Glucose, Bld 10/13/2021 159 (H)  70 - 99 mg/dL Final   Glucose reference range applies only to samples taken after fasting for at least 8 hours.   BUN 10/13/2021 56 (H)  6 - 20 mg/dL Final   Creatinine, Ser 10/13/2021 5.13 (H)  0.44 - 1.00 mg/dL Final   Calcium 10/13/2021 10.1  8.9 - 10.3 mg/dL Final   Total Protein 10/13/2021 7.9  6.5 - 8.1 g/dL Final   Albumin 10/13/2021 4.1  3.5 - 5.0 g/dL Final   AST 10/13/2021 20  15 - 41 U/L Final   ALT 10/13/2021 16  0 - 44 U/L Final   Alkaline Phosphatase 10/13/2021 68  38 - 126 U/L Final   Total Bilirubin 10/13/2021 0.7  0.3 - 1.2 mg/dL Final   GFR, Estimated 10/13/2021 9 (L)  >60 mL/min Final   Comment: (NOTE) Calculated using the CKD-EPI Creatinine Equation (2021)    Anion gap 10/13/2021 16 (H)  5 - 15 Final   Performed at [facility], [place] [address] [place], [place] [address]   TSH 10/13/2021 3.141  0.350 - 4.500 uIU/mL Final   Comment: Performed by a 3rd Generation assay with a functional sensitivity of <=0.01 uIU/mL. Performed at [facility], [place],  [place] [address]    POC Amphetamine UR 10/13/2021 None Detected  NONE DETECTED (Cut Off Level 1000 ng/mL) Preliminary   POC Secobarbital (BAR) 10/13/2021 None Detected  NONE DETECTED (Cut Off Level 300 ng/mL) Preliminary    POC Buprenorphine (BUP) 10/13/2021 None Detected  NONE DETECTED (Cut Off Level 10 ng/mL) Preliminary   POC Oxazepam (BZO) 10/13/2021 None Detected  NONE DETECTED (Cut Off Level 300 ng/mL) Preliminary   POC Cocaine UR 10/13/2021 None Detected  NONE DETECTED (Cut Off Level 300 ng/mL) Preliminary   POC Methamphetamine UR 10/13/2021 None Detected  NONE DETECTED (Cut Off Level 1000 ng/mL) Preliminary   POC Morphine 10/13/2021 None Detected  NONE DETECTED (Cut Off Level 300 ng/mL) Preliminary   POC Methadone UR 10/13/2021 None Detected  NONE DETECTED (Cut Off Level 300 ng/mL) Preliminary   POC Oxycodone UR 10/13/2021 None Detected  NONE DETECTED (Cut Off Level 100 ng/mL) Preliminary   POC Marijuana UR 10/13/2021 None Detected  NONE DETECTED (Cut Off Level 50 ng/mL) Preliminary   Glucose-Capillary 10/14/2021 120 (H)  70 - 99 mg/dL Final   Glucose reference range applies only to samples taken after fasting for at least 8 hours.  Admission on 09/06/2021, Discharged on 09/06/2021  Component Date Value Ref Range Status   Color, Urine 09/06/2021 YELLOW  YELLOW Final   APPearance 09/06/2021 TURBID (A)  CLEAR Final   Specific Gravity, Urine 09/06/2021 1.020  1.005 - 1.030 Final   pH 09/06/2021 6.0  5.0 - 8.0 Final   Glucose, UA 09/06/2021 NEGATIVE  NEGATIVE mg/dL Final   Hgb urine dipstick 09/06/2021 MODERATE (A)  NEGATIVE Final   Bilirubin Urine 09/06/2021 NEGATIVE  NEGATIVE Final   Ketones, ur 09/06/2021 NEGATIVE  NEGATIVE mg/dL Final   Protein, ur 09/06/2021 >=300 (A)  NEGATIVE mg/dL Final   Nitrite 09/06/2021 NEGATIVE  NEGATIVE Final   Leukocytes,Ua 09/06/2021 LARGE (A)  NEGATIVE Final   Performed at [facility], [address] [place], [place] [address]   Specimen Description 09/06/2021    Final                   Value:URINE, CLEAN CATCH Performed at [facility], [address], [place], [place] [address]    Special Requests 09/06/2021    Final                    Value:NONE Performed at [facility], [place] [place], [place] [address]    Culture 09/06/2021 >=100,000 COLONIES/mL ESCHERICHIA COLI (A)   Final   Report Status 09/06/2021 09/08/2021 FINAL   Final   Organism ID, Bacteria 09/06/2021 ESCHERICHIA COLI (A)   Final   RBC / HPF 09/06/2021 0-5  0 - 5 RBC/hpf Final   WBC, UA 09/06/2021 >50  0 - 5 WBC/hpf Final   Bacteria, UA 09/06/2021 MANY (A)  NONE SEEN Final   Squamous Epithelial / LPF 09/06/2021 0-5  0 - 5 Final   WBC Clumps 09/06/2021 PRESENT   Final   Performed at [facility], [place] [place], [place] [address]    Allergies: Lisinopril and Amoxicillin  PTA Medications: (Not in a hospital admission)   Medical Decision Making  Inpatient admission We will restart home medications where appropriate    Recommendations  Based on my evaluation the patient appears to have an emergency medical condition for which I recommend the patient be transferred to the emergency department for further evaluation.  Derrill Center, NP 10/14/21  4:50 PM

## 2021-10-13 NOTE — Discharge Instructions (Addendum)
Take all medications as prescribed. Keep all follow-up appointments as scheduled.  Do not consume alcohol or use illegal drugs while on prescription medications. Report any adverse effects from your medications to your primary care provider promptly.  In the event of recurrent symptoms or worsening symptoms, call 911, a crisis hotline, or go to the nearest emergency department for evaluation.   

## 2021-10-13 NOTE — BH Assessment (Signed)
Comprehensive Clinical Assessment (CCA) Note  10/13/2021 Lorraine Turner [id number]  DISPOSITION: Lorraine Ala, NP completed MSE and determined Pt meets criteria for inpatient psychiatric treatment.  The patient demonstrates the following risk factors for suicide: Chronic risk factors for suicide include: psychiatric disorder of schizoaffective disorder, bipolar type, previous suicide attempts by walking into traffic, overdose, threatening to jump from a bridge, ingesting bleach, and medical illness diabetes . Acute risk factors for suicide include: family or marital conflict, unemployment, and loss (financial, interpersonal, professional). Protective factors for this patient include: responsibility to others (children, family). Considering these factors, the overall suicide risk at this point appears to be high. Patient is not appropriate for outpatient follow up.  [name] from 10/13/2021 in [facility] ED from 10/06/2021 in [name] from 01/02/2021 in [name] CATEGORY High Risk No Risk No Risk      Pt is 61 year old widowed female who presents unaccompanied to [facility] voluntarily via Event organiser. Pt reports she has a history of mental health treatment and has been diagnosed with schizophrenia, bipolar disorder, and GAD. She says last night she and one of her daughters, Lorraine Turner, had an argument regarding Pt's boyfriend of 6 months, Lorraine. Pt says Lorraine Turner was asking for assistance because she is homeless with three children and threatened to cut her own throat. Pt says she feels "stuck in the middle" and "I can't take care of everyone's problems." She says Lorraine Turner told Pt, "I am done with you" which hurt Pt's feelings. Pt says she has chronic auditory hallucinations that "encourage me to harm myself" and today she was lying on a railroad track attempting to kill herself.  Law enforcement found Pt on the railroad tracks and escorted her to [name].  Pt says she has felt happy, anxious, and depressed recently. She acknowledges symptoms including crying spells, fatigue, irritability, decreased concentration, decreased sleep, decreased appetite and feelings of guilt, worthlessness and hopelessness. She acknowledges a history of multiple suicide attempts in the past including walking in front of a fire truck, walking into traffic, overdosing on medications, ingesting bleach, and threatening to jump from a bridge. She denies homicidal ideation or history of aggression. She denies visual hallucinations. She reports drinking two "Twisted Teas" today because she was upset but denies abusing alcohol. She denies use of other substances, including tobacco.   Pt reports her husband died in 10-Apr-2016. She says following his death their home was foreclosed on and she became homeless, [place] [address]. She says six months ago she met her boyfriend Lorraine Turner and he has provided her with shelter. She says she is happy with Lorraine Turner but her children are not happy, "and because they are not happy they don't want me to be happy." She says three of her children live locally and two live in [place], [place]. She says she has not been employed in years but recently has applied for part-time work at [employer]. She denies history of abuse, stating she had a wonderful childhood. She denies history of abuse as an adult. She denies legal problems. She denies access to firearms.  Pt would not give permission to contact anyone for collateral information and says she specifically does not want her daughter Lorraine Turner contacted.   Pt says she has no mental health providers, that she used to see psychiatrist Lorraine Turner. She says she has not taken medication in years and  has been doing well without medication, that she has been using "coping skills in my toolbox." She says recently she has felt  overwhelmed. She says she has been psychiatrically hospitalized 5 times in the past, most recently at [name] in 2016.  Pt is casually dressed, alert and oriented x4. Pt speaks in a clear tone, at moderate volume and normal pace with occasional stutter. Motor behavior appears normal. Eye contact is good and Pt is tearful at times. Pt's mood is depressed and affect is congruent with mood. Thought process is coherent and relevant. There is no indication Pt is currently responding to internal stimuli or experiencing delusional thought content. She is cooperative and willing to sign voluntarily into a psychiatric facility.  Chief Complaint:  Chief Complaint  Patient presents with   Suicidal   Visit Diagnosis: F25.0 Schizoaffective disorder, Bipolar type   CCA Screening, Triage and Referral (STR)  Patient Reported Information How did you hear about [place]? Legal System  What Is the Reason for Your Visit/Call Today? Pt presents to [facility] voluntarily escorted by [name] due to ongoing SI with a plan. Per [name] pt was found in the middle of the railroad tracks and mentioned SI. Pt states she got into an argument with her daughter lastnight and her daughter said she was done with her. Pt states this hurt her feelings and she decided to go to the railroad tracks to end things. Pt denies HI and AVH.  How Long Has This Been Causing You Problems? <Week  What Do You Feel Would Help You the Most Today? Treatment for Depression or other mood problem   Have You Recently Had Any Thoughts About Hurting Yourself? Yes  Are You Planning to Commit Suicide/Harm Yourself At This time? Yes   Have you Recently Had Thoughts About Hurting Someone Lorraine Turner? No  Are You Planning to Harm Someone at This Time? No  Explanation: No data recorded  Have You Used Any Alcohol or Drugs in the Past 24 Hours? Yes  How Long Ago Did You Use Drugs or Alcohol? No data recorded What Did You Use and How Much? alcohol,2  drinks   Do You Currently Have a Therapist/Psychiatrist? No  Name of Therapist/Psychiatrist: [name] Recently Discharged From Any Office Practice or Programs? No  Explanation of Discharge From Practice/Program: NA     CCA Screening Triage Referral Assessment Type of Contact: Face-to-Face  Telemedicine Service Delivery:   Is this Initial or Reassessment? Initial Assessment  Date Telepsych consult ordered in [facility]:  11/17/20  Time Telepsych consult ordered in [facility]:  No data recorded Location of Assessment: [facility] Assessment Services  Provider Location: [facility] Assessment Services   Collateral Involvement: None at this time   Does Patient Have a Stage manager Guardian? No data recorded Name and Contact of Legal Guardian: No data recorded If Minor and Not Living with Parent(s), Who has Custody? NA  Is CPS involved or ever been involved? Never  Is APS involved or ever been involved? Never   Patient Determined To Be At Risk for Harm To Self or Others Based on Review of Patient Reported Information or Presenting Complaint? Yes, for Self-Harm  Method: No data recorded Availability of Means: No data recorded Intent: No data recorded Notification Required: No data recorded Additional Information for Danger to Others Potential: No data recorded Additional Comments for Danger to Others Potential: No data recorded Are There Guns or Other Weapons in Your Home?  No data recorded Types of Guns/Weapons: No data recorded Are These Weapons Safely Secured?                            No data recorded Who Could Verify You Are Able To Have These Secured: No data recorded Do You Have any Outstanding Charges, Pending Court Dates, Parole/Probation? No data recorded Contacted To Inform of Risk of Harm To Self or Others: Unable to Contact:    Does Patient Present under Involuntary Commitment? No  IVC Papers Initial File Date: No data recorded  [place]  of Residence: [place]   Patient Currently Receiving the Following Services: Medication Management   Determination of Need: Emergent (2 hours)   Options For Referral: Inpatient Hospitalization     CCA Biopsychosocial Patient Reported Schizophrenia/Schizoaffective Diagnosis in Past: Yes   Strengths: Pt is willing to participate in treatment   Mental Health Symptoms Depression:   Change in energy/activity; Fatigue; Hopelessness; Sleep (too much or little); Tearfulness; Worthlessness   Duration of Depressive symptoms:  Duration of Depressive Symptoms: Greater than two weeks   Mania:   None   Anxiety:    Worrying; Sleep; Tension; Restlessness; Fatigue   Psychosis:   Hallucinations   Duration of Psychotic symptoms:  Duration of Psychotic Symptoms: Greater than six months   Trauma:   None   Obsessions:   None   Compulsions:   None   Inattention:   None   Hyperactivity/Impulsivity:   None   Oppositional/Defiant Behaviors:   None   Emotional Irregularity:   Chronic feelings of emptiness   Other Mood/Personality Symptoms:   None noted    Mental Status Exam Appearance and self-care  Stature:   Average   Weight:   Average weight   Clothing:   Casual   Grooming:   Normal   Cosmetic use:   Age appropriate   Posture/gait:   Normal   Motor activity:   Not Remarkable   Sensorium  Attention:   Normal   Concentration:   Normal   Orientation:   X5   Recall/memory:   Normal   Affect and Mood  Affect:   Appropriate; Depressed   Mood:   Depressed   Relating  Eye contact:   Normal   Facial expression:   Depressed; Responsive   Attitude toward examiner:   Cooperative   Thought and Language  Speech flow:  Clear and Coherent   Thought content:   Appropriate to Mood and Circumstances   Preoccupation:   None   Hallucinations:   None   Organization:  No data recorded  [employer] of Knowledge:    Average   Intelligence:   Average   Abstraction:   Normal   Judgement:   Fair   Reality Testing:   Adequate   Insight:   Fair   Decision Making:   Normal; Vacilates   Social Functioning  Social Maturity:   Responsible   Social Judgement:   Naive   Stress  Stressors:   Family conflict; Housing; Museum/gallery curator; Work   Coping Ability:   Deficient supports   Skill Deficits:   None   Supports:   Friends/Service system; Support needed     Religion: Religion/Spirituality Are You A Religious Person?: Yes What is Your Religious Affiliation?: Christian How Might This Affect Treatment?: Not assessed  Leisure/Recreation: Leisure / Recreation Do You Have Hobbies?: Yes Leisure and Hobbies: Fishing, boating, singing  Exercise/Diet: Exercise/Diet Do You  Exercise?: Yes What Type of Exercise Do You Do?: Run/Walk How Many Times a Week Do You Exercise?: 4-5 times a week Have You Gained or Lost A Significant Amount of Weight in the Past Six Months?: No Do You Follow a Special Diet?: Yes Type of Diet: Diabetic Do You Have Any Trouble Sleeping?: Yes Explanation of Sleeping Difficulties: Pt reports restless sleep   CCA Employment/Education Employment/Work Situation: Employment / Work Situation Employment Situation: Unemployed Patient's Job has Been Impacted by Current Illness: Yes Describe how Patient's Job has Been Impacted: Pt reports that she cannot focus well and has not worked in over 10 years Has Patient ever Been in Passenger transport manager?: No  Education: Education Is Patient Currently Attending School?: No Did Physicist, medical?: Yes What Type of College Degree Do you Have?: 2 years college studying business Did You Have An Individualized Education Program (IIEP): No Did You Have Any Difficulty At [employer]?: No Patient's Education Has Been Impacted by Current Illness: No   CCA Family/Childhood History Family and Relationship History: Family history Marital  status: Widowed Widowed, when?: 04/24/2016, pt reports her husband died  Does patient have children?: Yes How many children?: 5 How is patient's relationship with their children?: 3 live locally and 2 live in [place], [place]. Pt reports "they are grown and living their own life"  Childhood History:  Childhood History By whom was/is the patient raised?: Both parents Did patient suffer any verbal/emotional/physical/sexual abuse as a child?: No Did patient suffer from severe childhood neglect?: No Has patient ever been sexually abused/assaulted/raped as an adolescent or adult?: No Was the patient ever a victim of a crime or a disaster?: No Witnessed domestic violence?: No Has patient been affected by domestic violence as an adult?: No  Child/Adolescent Assessment:     CCA Substance Use Alcohol/Drug Use: Alcohol / Drug Use Pain Medications: See MAR Prescriptions: See MAR Over the Counter: See MAR History of alcohol / drug use?: No history of alcohol / drug abuse Longest period of sobriety (when/how long): N/A                         ASAM's:  Six Dimensions of Multidimensional Assessment  Dimension 1:  Acute Intoxication and/or Withdrawal Potential:      Dimension 2:  Biomedical Conditions and Complications:      Dimension 3:  Emotional, Behavioral, or Cognitive Conditions and Complications:     Dimension 4:  Readiness to Change:     Dimension 5:  Relapse, Continued use, or Continued Problem Potential:     Dimension 6:  Recovery/Living Environment:     ASAM Severity Score:    ASAM Recommended Level of Treatment:     Substance use Disorder (SUD)    Recommendations for Services/Supports/Treatments:    Discharge Disposition: Discharge Disposition Medical Exam completed: Yes Disposition of Patient: Admit  DSM5 Diagnoses: Patient Active Problem List   Diagnosis Date Noted   Intractable nausea and vomiting 12/15/2020   Hypokalemia 12/15/2020   UTI (urinary tract  infection) 11/24/2020   Acute renal failure superimposed on stage 4 chronic kidney disease ([place])    Hypertensive urgency 09/01/2020   CKD (chronic kidney disease), stage III ([place]) 09/01/2020   DKA (diabetic ketoacidoses) 09/12/2018   Adjustment disorder with mixed disturbance of emotions and conduct    MDD (major depressive disorder), severe ([place]) 11/03/3005   Umbilical hernia with obstruction 11/24/2015   Symptomatic cholelithiasis 11/24/2015   Type 2 diabetes mellitus with hyperglycemia ([place])  Suicide attempt ([place]) 05/06/2014   Schizoaffective disorder, depressive type ([place]) 08/14/2011   UNSPECIFIED ANEMIA 01/23/2009   Primary hypertension 10/12/2008   HYPERCHOLESTEROLEMIA 01/01/2007   PANIC DISORDER 01/01/2007   Obsessive compulsive disorder 01/01/2007     Referrals to Alternative Service(s): Referred to Alternative Service(s):   Place:   Date:   Time:    Referred to Alternative Service(s):   Place:   Date:   Time:    Referred to Alternative Service(s):   Place:   Date:   Time:    Referred to Alternative Service(s):   Place:   Date:   Time:     Evelena Peat, [facility]

## 2021-10-14 LAB — COMPREHENSIVE METABOLIC PANEL
ALT: 16 U/L (ref 0–44)
ALT: 15 U/L (ref 0–44)
AST: 20 U/L (ref 15–41)
AST: 18 U/L (ref 15–41)
Albumin: 4.1 g/dL (ref 3.5–5.0)
Albumin: 3.7 g/dL (ref 3.5–5.0)
Alkaline Phosphatase: 68 U/L (ref 38–126)
Alkaline Phosphatase: 67 U/L (ref 38–126)
Anion gap: 16 — ABNORMAL HIGH (ref 5–15)
Anion gap: 10 (ref 5–15)
BUN: 56 mg/dL — ABNORMAL HIGH (ref 6–20)
BUN: 64 mg/dL — ABNORMAL HIGH (ref 6–20)
CO2: 25 mmol/L (ref 22–32)
CO2: 26 mmol/L (ref 22–32)
Calcium: 10.1 mg/dL (ref 8.9–10.3)
Calcium: 9.3 mg/dL (ref 8.9–10.3)
Chloride: 100 mmol/L (ref 98–111)
Chloride: 104 mmol/L (ref 98–111)
Creatinine, Ser: 5.13 mg/dL — ABNORMAL HIGH (ref 0.44–1.00)
Creatinine, Ser: 5.27 mg/dL — ABNORMAL HIGH (ref 0.44–1.00)
GFR, Estimated: 9 mL/min — ABNORMAL LOW (ref >60)
GFR, Estimated: 9 mL/min — ABNORMAL LOW (ref >60)
Glucose, Bld: 159 mg/dL — ABNORMAL HIGH (ref 70–99)
Glucose, Bld: 183 mg/dL — ABNORMAL HIGH (ref 70–99)
Potassium: 3.9 mmol/L (ref 3.5–5.1)
Potassium: 4.4 mmol/L (ref 3.5–5.1)
Sodium: 141 mmol/L (ref 135–145)
Sodium: 140 mmol/L (ref 135–145)
Total Bilirubin: 0.7 mg/dL (ref 0.3–1.2)
Total Bilirubin: 0.5 mg/dL (ref 0.3–1.2)
Total Protein: 7.9 g/dL (ref 6.5–8.1)
Total Protein: 7.3 g/dL (ref 6.5–8.1)

## 2021-10-14 LAB — CBC WITH DIFFERENTIAL/PLATELET
Abs Immature Granulocytes: 0.04 10*3/uL (ref 0.00–0.07)
Basophils Absolute: 0.1 10*3/uL (ref 0.0–0.1)
Basophils Relative: 1 %
Eosinophils Absolute: 0.1 10*3/uL (ref 0.0–0.5)
Eosinophils Relative: 2 %
HCT: 28.8 % — ABNORMAL LOW (ref 36.0–46.0)
Hemoglobin: 9.7 g/dL — ABNORMAL LOW (ref 12.0–15.0)
Immature Granulocytes: 1 %
Lymphocytes Relative: 25 %
Lymphs Abs: 1.4 10*3/uL (ref 0.7–4.0)
MCH: 30.2 pg (ref 26.0–34.0)
MCHC: 33.7 g/dL (ref 30.0–36.0)
MCV: 89.7 fL (ref 80.0–100.0)
Monocytes Absolute: 0.5 10*3/uL (ref 0.1–1.0)
Monocytes Relative: 9 %
Neutro Abs: 3.5 10*3/uL (ref 1.7–7.7)
Neutrophils Relative %: 62 %
Platelets: 268 10*3/uL (ref 150–400)
RBC: 3.21 MIL/uL — ABNORMAL LOW (ref 3.87–5.11)
RDW: 12.5 % (ref 11.5–15.5)
WBC: 5.7 10*3/uL (ref 4.0–10.5)
nRBC: 0 % (ref 0.0–0.2)

## 2021-10-14 LAB — TSH: TSH: 3.141 u[IU]/mL (ref 0.350–4.500)

## 2021-10-14 LAB — RESP PANEL BY RT-PCR (FLU A&B, COVID) ARPGX2
Influenza A by PCR: NEGATIVE
Influenza B by PCR: NEGATIVE
SARS Coronavirus 2 by RT PCR: NEGATIVE

## 2021-10-14 LAB — GLUCOSE, CAPILLARY
Glucose-Capillary: 120 mg/dL — ABNORMAL HIGH (ref 70–99)
Glucose-Capillary: 89 mg/dL (ref 70–99)
Glucose-Capillary: 180 mg/dL — ABNORMAL HIGH (ref 70–99)

## 2021-10-14 MED ORDER — INSULIN ASPART 100 UNIT/ML IJ SOLN: 0.0000 [IU] | Freq: Three times a day (TID) | INTRAMUSCULAR | Status: DC

## 2021-10-14 MED ORDER — AMLODIPINE BESYLATE 5 MG PO TABS
5.0000 mg | ORAL_TABLET | Freq: Every day | ORAL | Status: DC
Start: 2021-10-14 — End: 2021-10-15
  Administered 2021-10-14: 5 mg via ORAL
  Filled 2021-10-14: qty 1

## 2021-10-14 MED ORDER — METFORMIN HCL 500 MG PO TABS: 500.0000 mg | ORAL_TABLET | Freq: Two times a day (BID) | ORAL | Status: DC

## 2021-10-14 MED ORDER — INSULIN ASPART 100 UNIT/ML IJ SOLN: 0.0000 [IU] | Freq: Every day | INTRAMUSCULAR | Status: DC

## 2021-10-14 MED ORDER — QUETIAPINE FUMARATE 50 MG PO TABS
50.0000 mg | ORAL_TABLET | Freq: Two times a day (BID) | ORAL | Status: DC
Start: 2021-10-14 — End: 2021-10-15
  Administered 2021-10-14: 50 mg via ORAL
  Filled 2021-10-14: qty 1

## 2021-10-14 NOTE — Progress Notes (Signed)
Per [name] Ala, NP, patient meets criteria for inpatient treatment. There are no available beds at [facility] today. CSW faxed referrals to the following facilities for review:  [facility]  Pending - No Request Sent N/A [address], [place] [address] [phone] --  [facility]  Pending - No Request Sent N/A [address], [place] [address] [phone] --   [facility]  Pending - No Request [facility] [address] [place] [address] [phone] --  [address] [place] [address] [phone] --  [place]  Pending - No Request Sent N/A [address] [place] [address] [phone] --  [facility]  Pending - No Request Sent N/A [address] [place], [place] [address] [phone] --  [facility]  Pending - No Request Sent N/A [address] [place] [address] [place] --  [facility]  Pending - No Request Sent N/A [address], [place] [address] [facility]  Pending - No Request Sent N/A [address] [place] [address] [phone] --  [facility]  Pending - No Request Sent N/A [address] [place] [address] [phone] --  [place]  Pending - No Request Sent N/A [address], [place] [address] [phone] --  [facility]  Pending - No Request Sent N/A [place], [place] [address] [phone] --  [place]  Pending - No Request Sent N/A [address] [place] [address] [phone] --  [facility]  Pending - No Request Sent N/A [address] [place] [address] [phone] --  [facility]  Pending - No Request Sent N/A [address] [place] [address] [phone] --   Discharge Information  TTS will continue to seek bed placement.  Glennie Isle, MSW, [facility] Phone: [phone] Disposition/TOC

## 2021-10-14 NOTE — ED Notes (Signed)
Pt sleeping@this time. Breathing even and unlabored. Will continue to monitor for safety 

## 2021-10-14 NOTE — ED Notes (Signed)
Lorraine Turner is currently asleep with no distress or restless sleep patterns noted respirations are easy.

## 2021-10-14 NOTE — ED Notes (Signed)
Mrs. Lorraine Turner remains asleep no distress or sleep disturbance is noted positive rise and fall of chest noted and respirations appear easy.

## 2021-10-14 NOTE — ED Notes (Signed)
Pt sleeping at present, no distress noted.  Monitoring for safety. 

## 2021-10-14 NOTE — ED Provider Notes (Signed)
Behavioral Health Progress Note  Date and Time: 10/14/2021 4:51 PM Name: Lorraine Turner MRN:  [id number]  Subjective:  Lorraine Turner reported " I am feeling better now, that I was able to get some rest."  Multiple attempts to reassess patient throughout the day however patient observed resting in bed.  Per nursing staff patient was only up for breakfast and has been resting for the remainder of the day.    During this assessment at 1700.  She is adamantly denying suicidal or homicidal ideations.  Denies auditory visual hallucinations.  States " I was hopeful I will be able to go home."  Home medications was restarted.  Adjustment made to patient's Seroquel 100 mg BID.  Patient is now scheduled Seroquel 50 mg p.o. twice daily and I have discontinued trazodone due to possible oversedation. Will continue to recommend inpatient admission.  During evaluation Lorraine Turner is sitting  in no acute distress. She is alert/oriented x 3, calm/cooperative; and mood congruent with affect.  She is speaking in a clear tone at moderate volume, and normal pace; with good eye contact.Her thought process is coherent and relevant; There is no indication that she is currently responding to internal/external stimuli or experiencing delusional thought content; and she has denied suicidal/self-harm/homicidal ideation, psychosis, and paranoia.   Patient has remained calm throughout assessment and has answered questions appropriately.       Diagnosis:  Final diagnoses:  MDD (major depressive disorder), severe ([place])  Suicide attempt ([place])    Total Time spent with patient: 15 minutes  Past Psychiatric History:  Past Medical History:  Past Medical History:  Diagnosis Date   AKI (acute kidney injury) ([place]) 09/12/2018   Anxiety    Bipolar 1 disorder ([place])    Depression    Diabetes mellitus without complication ([place])    Diarrhea 11/21/2020   Gallstones    Hyperlipidemia    Hypertension    MDD (major depressive  disorder), recurrent episode, moderate ([place]) 11/18/2020   Neuropathy    Schizophrenia ([place])    Seizures ([place])    X1- febrile seizure as a child-none since   Suicide ideation 09/12/2018    Past Surgical History:  Procedure Laterality Date   CESAREAN SECTION  05/1998   X 1   CHOLECYSTECTOMY N/A 03/27/2016   Procedure: LAPAROSCOPIC CHOLECYSTECTOMY;  Surgeon: Olean Ree, MD;  Location: [facility] ORS;  Service: General;  Laterality: N/A;   TONSILLECTOMY AND ADENOIDECTOMY     age 61   TUBAL LIGATION     Family History:  Family History  Problem Relation Age of Onset   Diabetes Mother    Heart disease Mother    Kidney disease Mother    Stroke Mother    Hyperlipidemia Mother    Diabetes Maternal Aunt    Family Psychiatric  History:  Social History:  Social History   Substance and Sexual Activity  Alcohol Use Not Currently   Alcohol/week: 1.0 standard drink of alcohol   Types: 1 Cans of beer per week   Comment: One can of beer every 2-3 months.      Social History   Substance and Sexual Activity  Drug Use No    Social History   Socioeconomic History   Marital status: Single    Spouse name: Not on file   Number of children: Not on file   Years of education: Not on file   Highest education level: Not on file  Occupational History   Not on file  Tobacco Use  Smoking status: Former    Types: Cigarettes    Quit date: 03/14/2013    Years since quitting: 8.5   Smokeless tobacco: Never   Tobacco comments:    1 cigarette1-2 times year, only when she gets stressed  Vaping Use   Vaping Use: Never used  Substance and Sexual Activity   Alcohol use: Not Currently    Alcohol/week: 1.0 standard drink of alcohol    Types: 1 Cans of beer per week    Comment: One can of beer every 2-3 months.    Drug use: No   Sexual activity: Not Currently  Other Topics Concern   Not on file  Social History Narrative   ** Merged History Encounter **       Social Determinants of Health    Financial Resource Strain: Not on file  Food Insecurity: Not on file  Transportation Needs: Not on file  Physical Activity: Not on file  Stress: Not on file  Social Connections: Not on file   SDOH:  SDOH Screenings   Alcohol Screen: Low Risk  (11/17/2020)  Depression (PHQ2-9): Low Risk  (10/25/2020)  Recent Concern: Depression (PHQ2-9) - Medium Risk (09/26/2020)  Tobacco Use: Medium Risk (10/06/2021)   Additional Social History:    Pain Medications: See MAR Prescriptions: See MAR Over the Counter: See MAR History of alcohol / drug use?: No history of alcohol / drug abuse Longest period of sobriety (when/how long): N/A                    Sleep: Good  Appetite:  Good  Current Medications:  Current Facility-Administered Medications  Medication Dose Route Frequency Provider Last Rate Last Admin   acetaminophen (TYLENOL) tablet 650 mg  650 mg Oral Q6H PRN Derrill Center, NP       alum & mag hydroxide-simeth (MAALOX/MYLANTA) 200-200-20 MG/5ML suspension 30 mL  30 mL Oral Q4H PRN Derrill Center, NP       amLODipine (NORVASC) tablet 5 mg  5 mg Oral Daily Derrill Center, NP   5 mg at 10/14/21 1110   fluticasone (FLONASE) 50 MCG/ACT nasal spray 1 spray  1 spray Each Nare Daily PRN Bobbitt, Shalon E, NP   1 spray at 10/13/21 2253   insulin aspart (novoLOG) injection 0-5 Units  0-5 Units Subcutaneous QHS Austen Wygant N, NP       insulin aspart (novoLOG) injection 0-9 Units  0-9 Units Subcutaneous TID WC Tiffine Henigan, Marcy Panning, NP       magnesium hydroxide (MILK OF MAGNESIA) suspension 30 mL  30 mL Oral Daily PRN Derrill Center, NP       ondansetron (ZOFRAN-ODT) disintegrating tablet 4 mg  4 mg Oral Once Derrill Center, NP       QUEtiapine (SEROQUEL) tablet 50 mg  50 mg Oral BID Derrill Center, NP       No current outpatient medications on file.    Labs  Lab Results:  Admission on 10/13/2021  Component Date Value Ref Range Status   SARS Coronavirus 2 by RT PCR 10/13/2021  NEGATIVE  NEGATIVE Final   Comment: (NOTE) SARS-CoV-2 target nucleic acids are NOT DETECTED.  The SARS-CoV-2 RNA is generally detectable in upper respiratory specimens during the acute phase of infection. The lowest concentration of SARS-CoV-2 viral copies this assay can detect is 138 copies/mL. A negative result does not preclude SARS-Cov-2 infection and should not be used as the sole basis for treatment or other patient  management decisions. A negative result may occur with  improper specimen collection/handling, submission of specimen other than nasopharyngeal swab, presence of viral mutation(s) within the areas targeted by this assay, and inadequate number of viral copies(<138 copies/mL). A negative result must be combined with clinical observations, patient history, and epidemiological information. The expected result is Negative.  Fact Sheet for Patients:  EntrepreneurPulse.com.au  Fact Sheet for Healthcare Providers:  IncredibleEmployment.be  This test is no                          t yet approved or cleared by the [place] FDA and  has been authorized for detection and/or diagnosis of SARS-CoV-2 by FDA under an Emergency Use Authorization (EUA). This EUA will remain  in effect (meaning this test can be used) for the duration of the COVID-19 declaration under Section 564(b)(1) of the Act, 21 U.S.C.section 360bbb-3(b)(1), unless the authorization is terminated  or revoked sooner.       Influenza A by PCR 10/13/2021 NEGATIVE  NEGATIVE Final   Influenza B by PCR 10/13/2021 NEGATIVE  NEGATIVE Final   Comment: (NOTE) The Xpert Xpress SARS-CoV-2/FLU/RSV plus assay is intended as an aid in the diagnosis of influenza from Nasopharyngeal swab specimens and should not be used as a sole basis for treatment. Nasal washings and aspirates are unacceptable for Xpert Xpress SARS-CoV-2/FLU/RSV testing.  Fact Sheet for  Patients: EntrepreneurPulse.com.au  Fact Sheet for Healthcare Providers: IncredibleEmployment.be  This test is not yet approved or cleared by the [place] FDA and has been authorized for detection and/or diagnosis of SARS-CoV-2 by FDA under an Emergency Use Authorization (EUA). This EUA will remain in effect (meaning this test can be used) for the duration of the COVID-19 declaration under Section 564(b)(1) of the Act, 21 U.S.C. section 360bbb-3(b)(1), unless the authorization is terminated or revoked.  Performed at [facility], [place] [address], [place], [place] [address]    WBC 10/13/2021 5.7  4.0 - 10.5 K/uL Final   RBC 10/13/2021 3.21 (L)  3.87 - 5.11 MIL/uL Final   Hemoglobin 10/13/2021 9.7 (L)  12.0 - 15.0 g/dL Final   HCT 10/13/2021 28.8 (L)  36.0 - 46.0 % Final   MCV 10/13/2021 89.7  80.0 - 100.0 fL Final   MCH 10/13/2021 30.2  26.0 - 34.0 pg Final   MCHC 10/13/2021 33.7  30.0 - 36.0 g/dL Final   RDW 10/13/2021 12.5  11.5 - 15.5 % Final   Platelets 10/13/2021 268  150 - 400 K/uL Final   nRBC 10/13/2021 0.0  0.0 - 0.2 % Final   Neutrophils Relative % 10/13/2021 62  % Final   Neutro Abs 10/13/2021 3.5  1.7 - 7.7 K/uL Final   Lymphocytes Relative 10/13/2021 25  % Final   Lymphs Abs 10/13/2021 1.4  0.7 - 4.0 K/uL Final   Monocytes Relative 10/13/2021 9  % Final   Monocytes Absolute 10/13/2021 0.5  0.1 - 1.0 K/uL Final   Eosinophils Relative 10/13/2021 2  % Final   Eosinophils Absolute 10/13/2021 0.1  0.0 - 0.5 K/uL Final   Basophils Relative 10/13/2021 1  % Final   Basophils Absolute 10/13/2021 0.1  0.0 - 0.1 K/uL Final   Immature Granulocytes 10/13/2021 1  % Final   Abs Immature Granulocytes 10/13/2021 0.04  0.00 - 0.07 K/uL Final   Performed at [facility], [place] [address], [place], [place] [address]   Sodium 10/13/2021 141  135 -  145 mmol/L Final   Potassium 10/13/2021 3.9  3.5 - 5.1 mmol/L Final   Chloride 10/13/2021  100  98 - 111 mmol/L Final   CO2 10/13/2021 25  22 - 32 mmol/L Final   Glucose, Bld 10/13/2021 159 (H)  70 - 99 mg/dL Final   Glucose reference range applies only to samples taken after fasting for at least 8 hours.   BUN 10/13/2021 56 (H)  6 - 20 mg/dL Final   Creatinine, Ser 10/13/2021 5.13 (H)  0.44 - 1.00 mg/dL Final   Calcium 10/13/2021 10.1  8.9 - 10.3 mg/dL Final   Total Protein 10/13/2021 7.9  6.5 - 8.1 g/dL Final   Albumin 10/13/2021 4.1  3.5 - 5.0 g/dL Final   AST 10/13/2021 20  15 - 41 U/L Final   ALT 10/13/2021 16  0 - 44 U/L Final   Alkaline Phosphatase 10/13/2021 68  38 - 126 U/L Final   Total Bilirubin 10/13/2021 0.7  0.3 - 1.2 mg/dL Final   GFR, Estimated 10/13/2021 9 (L)  >60 mL/min Final   Comment: (NOTE) Calculated using the CKD-EPI Creatinine Equation (2021)    Anion gap 10/13/2021 16 (H)  5 - 15 Final   Performed at [facility], [place] [address], [place], [place] [address]   TSH 10/13/2021 3.141  0.350 - 4.500 uIU/mL Final   Comment: Performed by a 3rd Generation assay with a functional sensitivity of <=0.01 uIU/mL. Performed at [facility], [place] [address], [place], [place] [address]    POC Amphetamine UR 10/13/2021 None Detected  NONE DETECTED (Cut Off Level 1000 ng/mL) Preliminary   POC Secobarbital (BAR) 10/13/2021 None Detected  NONE DETECTED (Cut Off Level 300 ng/mL) Preliminary   POC Buprenorphine (BUP) 10/13/2021 None Detected  NONE DETECTED (Cut Off Level 10 ng/mL) Preliminary   POC Oxazepam (BZO) 10/13/2021 None Detected  NONE DETECTED (Cut Off Level 300 ng/mL) Preliminary   POC Cocaine UR 10/13/2021 None Detected  NONE DETECTED (Cut Off Level 300 ng/mL) Preliminary   POC Methamphetamine UR 10/13/2021 None Detected  NONE DETECTED (Cut Off Level 1000 ng/mL) Preliminary   POC Morphine 10/13/2021 None Detected  NONE DETECTED (Cut Off Level 300 ng/mL) Preliminary   POC Methadone UR 10/13/2021 None Detected  NONE DETECTED (Cut Off Level 300 ng/mL)  Preliminary   POC Oxycodone UR 10/13/2021 None Detected  NONE DETECTED (Cut Off Level 100 ng/mL) Preliminary   POC Marijuana UR 10/13/2021 None Detected  NONE DETECTED (Cut Off Level 50 ng/mL) Preliminary   Glucose-Capillary 10/14/2021 120 (H)  70 - 99 mg/dL Final   Glucose reference range applies only to samples taken after fasting for at least 8 hours.  Admission on 09/06/2021, Discharged on 09/06/2021  Component Date Value Ref Range Status   Color, Urine 09/06/2021 YELLOW  YELLOW Final   APPearance 09/06/2021 TURBID (A)  CLEAR Final   Specific Gravity, Urine 09/06/2021 1.020  1.005 - 1.030 Final   pH 09/06/2021 6.0  5.0 - 8.0 Final   Glucose, UA 09/06/2021 NEGATIVE  NEGATIVE mg/dL Final   Hgb urine dipstick 09/06/2021 MODERATE (A)  NEGATIVE Final   Bilirubin Urine 09/06/2021 NEGATIVE  NEGATIVE Final   Ketones, ur 09/06/2021 NEGATIVE  NEGATIVE mg/dL Final   Protein, ur 09/06/2021 >=300 (A)  NEGATIVE mg/dL Final   Nitrite 09/06/2021 NEGATIVE  NEGATIVE Final   Leukocytes,Ua 09/06/2021 LARGE (A)  NEGATIVE Final   Performed at [facility], [address] [place], [place] [address]  Specimen Description 09/06/2021    Final                   Value:URINE, CLEAN CATCH Performed at [facility], [place] [place], [place] [address]    Special Requests 09/06/2021    Final                   Value:NONE Performed at [facility], [place] [place], [place] [address]    Culture 09/06/2021 >=100,000 COLONIES/mL ESCHERICHIA COLI (A)   Final   Report Status 09/06/2021 09/08/2021 FINAL   Final   Organism ID, Bacteria 09/06/2021 ESCHERICHIA COLI (A)   Final   RBC / HPF 09/06/2021 0-5  0 - 5 RBC/hpf Final   WBC, UA 09/06/2021 >50  0 - 5 WBC/hpf Final   Bacteria, UA 09/06/2021 MANY (A)  NONE SEEN Final   Squamous Epithelial / LPF 09/06/2021 0-5  0 - 5 Final   WBC Clumps 09/06/2021 PRESENT   Final   Performed at [facility], [place] [address] [place], [place] [address]    Blood Alcohol level:  Lab Results  Component Value Date   [facility] <10 11/16/2020   ETH <10 86/76/7209    Metabolic Disorder Labs: Lab Results  Component Value Date   HGBA1C 8.3 (H) 11/19/2020   MPG 191.51 11/19/2020   MPG 189 09/01/2020   No results found for: "PROLACTIN" Lab Results  Component Value Date   CHOL 197 11/18/2020   TRIG 144 11/18/2020   HDL 49 11/18/2020   CHOLHDL 4.0 11/18/2020   VLDL 29 11/18/2020   LDLCALC 119 (H) 11/18/2020   LDLCALC 158 (H) 01/13/2019    Therapeutic Lab Levels: No results found for: "LITHIUM" Lab Results  Component Value Date   VALPROATE <10.0 (L) 01/22/2007   No results found for: "CBMZ"  Physical Findings   AIMS    Flowsheet Row Admission (Discharged) from 03/03/2018 in [facility] [place] [facility]  AIMS Total Score 0      AUDIT    Flowsheet Row Admission (Discharged) from 03/03/2018 in [facility] Admission (Discharged) from 05/06/2014 in [facility] Admission (Discharged) from 08/29/2013 in [facility]  Alcohol Use Disorder Identification Test Final Score (AUDIT) 5 0 1      GAD-7    Flowsheet [place] from 10/25/2020 in [facility] from 10/04/2020 in [facility] Office Visit from 09/26/2020 in [facility] from 09/18/2020 in [facility] Office Visit from 01/24/2020 in [facility]  Total GAD-7 Score 1 1 15 20 20       PHQ2-9    [place] from 10/25/2020 in [facility] Most recent reading at 10/25/2020  2:20 [place] from 10/04/2020 in [facility] Most recent reading at 10/06/2020 10:08 AM ED from 09/26/2020 in [facility] DEPT Most recent reading at 09/26/2020  9:57 PM Office Visit from 09/26/2020 in [facility] Most recent reading at 09/26/2020 10:39 [place] from 09/18/2020 in [facility] Most recent reading at 09/19/2020  5:07 PM  PHQ-2 Total Score 0 2 6  6 6  PHQ-9 Total Score 1 4 14 17 17       Flowsheet Row ED from 10/13/2021 in [facility] from 10/06/2021 in [facility] from 01/02/2021 in [facility] High Risk No Risk No Risk        Musculoskeletal  Strength & Muscle Tone: within normal limits Gait & Station: normal Patient leans: N/A  Psychiatric Specialty Exam  Presentation  General Appearance: Disheveled  Eye Contact:Good  Speech:Clear and Coherent  Speech Volume:Normal  Handedness:Right   Mood and Affect  Mood:Anxious  Affect:Congruent   Thought Process  Thought Processes:Coherent  Descriptions of Associations:Intact  Orientation:Full (Time, Place and Person)  Thought Content:Logical  Diagnosis of Schizophrenia or Schizoaffective disorder in past: Yes  Duration of Psychotic Symptoms: Greater than six months   Hallucinations:Hallucinations: None  Ideas of Reference:None  Suicidal Thoughts:Suicidal Thoughts: Yes, Passive SI Passive Intent and/or Plan: With Intent  Homicidal Thoughts:Homicidal Thoughts: No   Sensorium  Memory:Recent Poor; Immediate Good  Judgment:Fair  Insight:Fair   Executive Functions  Concentration:Good  Attention Span:Fair  [place]  Language:Good   Psychomotor Activity  Psychomotor Activity:Psychomotor Activity: Normal   Assets  Assets:Desire for Improvement; Resilience; Social  Support   Sleep  Sleep:Sleep: Fair   Nutritional Assessment (For OBS and FBC admissions only) Has the patient had a weight loss or gain of 10 pounds or more in the last 3 months?: No Has the patient had a decrease in food intake/or appetite?: No Does the patient have dental problems?: No Does the patient have eating habits or behaviors that may be indicators of an eating disorder including binging or inducing vomiting?: No Has the patient recently lost weight without trying?: 0 Has the patient been eating poorly because of a decreased appetite?: 0 Malnutrition Screening Tool Score: 0    Physical Exam  Physical Exam ROS Blood pressure (!) 144/66, pulse 65, temperature 98.6 F (37 C), temperature source Oral, resp. rate 16, SpO2 100 %. There is no height or weight on file to calculate BMI.  Treatment Plan Summary: Daily contact with patient to assess and evaluate symptoms and progress in treatment and Medication management  Continue seeking inpatient admission Decreased Seroquel 100 mg po BID and Start Seroquel 50 mg  Discontinued Trazodone 50 mg nightly Discontinued Gabapentin  100 mg po BID due to Elevated Creatinine 5.13, BUN 56  repeat CMP 9/4   Derrill Center, NP 10/14/2021 4:51 PM

## 2021-10-14 NOTE — Progress Notes (Signed)
CSW spoke with [facility] with [facility] admissions. It was reported that the patient is under further review.  Glennie Isle, MSW, [place] Phone: [phone] Disposition/TOC

## 2021-10-14 NOTE — ED Notes (Signed)
Pt aox4.  Denies pain, SI, HI, and AVH.  Reports she feels better since restarting her medications.  Pt stated "I feel more in touch with myself."  Breathing is even and unlabored,  Will continue to monitor for safety.

## 2021-10-15 ENCOUNTER — Encounter (HOSPITAL_COMMUNITY): Payer: Self-pay | Admitting: Emergency Medicine

## 2021-10-15 ENCOUNTER — Observation Stay (HOSPITAL_COMMUNITY)
Admission: EM | Admit: 2021-10-15 | Discharge: 2021-10-16 | Disposition: A | Discharge status: Home / Self Care | Payer: Medicaid Other | Attending: Internal Medicine | Admitting: Internal Medicine

## 2021-10-15 ENCOUNTER — Observation Stay (HOSPITAL_COMMUNITY): Payer: Medicaid Other

## 2021-10-15 ENCOUNTER — Other Ambulatory Visit: Payer: Self-pay

## 2021-10-15 DIAGNOSIS — E1122 Type 2 diabetes mellitus with diabetic chronic kidney disease: Secondary | ICD-10-CM | POA: Insufficient documentation

## 2021-10-15 DIAGNOSIS — R45851 Suicidal ideations: Secondary | ICD-10-CM

## 2021-10-15 DIAGNOSIS — R7989 Other specified abnormal findings of blood chemistry: Secondary | ICD-10-CM | POA: Diagnosis present

## 2021-10-15 DIAGNOSIS — Z87891 Personal history of nicotine dependence: Secondary | ICD-10-CM | POA: Diagnosis not present

## 2021-10-15 DIAGNOSIS — I129 Hypertensive chronic kidney disease with stage 1 through stage 4 chronic kidney disease, or unspecified chronic kidney disease: Secondary | ICD-10-CM | POA: Diagnosis not present

## 2021-10-15 DIAGNOSIS — I160 Hypertensive urgency: Principal | ICD-10-CM | POA: Diagnosis present

## 2021-10-15 DIAGNOSIS — N184 Chronic kidney disease, stage 4 (severe): Secondary | ICD-10-CM

## 2021-10-15 LAB — CBC WITH DIFFERENTIAL/PLATELET
Abs Immature Granulocytes: 0.03 10*3/uL (ref 0.00–0.07)
Basophils Absolute: 0.1 10*3/uL (ref 0.0–0.1)
Basophils Relative: 1 %
Eosinophils Absolute: 0.3 10*3/uL (ref 0.0–0.5)
Eosinophils Relative: 4 %
HCT: 30 % — ABNORMAL LOW (ref 36.0–46.0)
Hemoglobin: 9.6 g/dL — ABNORMAL LOW (ref 12.0–15.0)
Immature Granulocytes: 0 %
Lymphocytes Relative: 31 %
Lymphs Abs: 2.2 10*3/uL (ref 0.7–4.0)
MCH: 30.2 pg (ref 26.0–34.0)
MCHC: 32 g/dL (ref 30.0–36.0)
MCV: 94.3 fL (ref 80.0–100.0)
Monocytes Absolute: 0.6 10*3/uL (ref 0.1–1.0)
Monocytes Relative: 9 %
Neutro Abs: 3.9 10*3/uL (ref 1.7–7.7)
Neutrophils Relative %: 55 %
Platelets: 272 10*3/uL (ref 150–400)
RBC: 3.18 MIL/uL — ABNORMAL LOW (ref 3.87–5.11)
RDW: 12.4 % (ref 11.5–15.5)
WBC: 7.2 10*3/uL (ref 4.0–10.5)
nRBC: 0 % (ref 0.0–0.2)

## 2021-10-15 LAB — URINALYSIS, ROUTINE W REFLEX MICROSCOPIC
Bacteria, UA: NONE SEEN
Bilirubin Urine: NEGATIVE
Glucose, UA: NEGATIVE mg/dL
Ketones, ur: NEGATIVE mg/dL
Leukocytes,Ua: NEGATIVE
Nitrite: NEGATIVE
Protein, ur: 100 mg/dL — AB
Specific Gravity, Urine: 1.011 (ref 1.005–1.030)
pH: 5 (ref 5.0–8.0)

## 2021-10-15 LAB — COMPREHENSIVE METABOLIC PANEL
ALT: 14 U/L (ref 0–44)
AST: 19 U/L (ref 15–41)
Albumin: 3.7 g/dL (ref 3.5–5.0)
Alkaline Phosphatase: 68 U/L (ref 38–126)
Anion gap: 11 (ref 5–15)
BUN: 64 mg/dL — ABNORMAL HIGH (ref 6–20)
CO2: 23 mmol/L (ref 22–32)
Calcium: 8.9 mg/dL (ref 8.9–10.3)
Chloride: 104 mmol/L (ref 98–111)
Creatinine, Ser: 5.17 mg/dL — ABNORMAL HIGH (ref 0.44–1.00)
GFR, Estimated: 9 mL/min — ABNORMAL LOW (ref >60)
Glucose, Bld: 145 mg/dL — ABNORMAL HIGH (ref 70–99)
Potassium: 4 mmol/L (ref 3.5–5.1)
Sodium: 138 mmol/L (ref 135–145)
Total Bilirubin: 0.5 mg/dL (ref 0.3–1.2)
Total Protein: 7.4 g/dL (ref 6.5–8.1)

## 2021-10-15 LAB — ETHANOL: Alcohol, Ethyl (B): <10 mg/dL (ref <10)

## 2021-10-15 LAB — GLUCOSE, CAPILLARY
Glucose-Capillary: 88 mg/dL (ref 70–99)
Glucose-Capillary: 193 mg/dL — ABNORMAL HIGH (ref 70–99)

## 2021-10-15 LAB — CBG MONITORING, ED
Glucose-Capillary: 149 mg/dL — ABNORMAL HIGH (ref 70–99)
Glucose-Capillary: 215 mg/dL — ABNORMAL HIGH (ref 70–99)

## 2021-10-15 LAB — PHOSPHORUS: Phosphorus: 4.7 mg/dL — ABNORMAL HIGH (ref 2.5–4.6)

## 2021-10-15 LAB — IRON AND TIBC
Iron: 69 ug/dL (ref 28–170)
Saturation Ratios: 23 % (ref 10.4–31.8)
TIBC: 304 ug/dL (ref 250–450)
UIBC: 235 ug/dL

## 2021-10-15 LAB — VITAMIN B12: Vitamin B-12: 382 pg/mL (ref 180–914)

## 2021-10-15 LAB — SALICYLATE LEVEL: Salicylate Lvl: <7 mg/dL — ABNORMAL LOW (ref 7.0–30.0)

## 2021-10-15 LAB — MAGNESIUM: Magnesium: 2.5 mg/dL — ABNORMAL HIGH (ref 1.7–2.4)

## 2021-10-15 LAB — FERRITIN: Ferritin: 123 ng/mL (ref 11–307)

## 2021-10-15 LAB — HEMOGLOBIN A1C
Hgb A1c MFr Bld: 6.9 % — ABNORMAL HIGH (ref 4.8–5.6)
Mean Plasma Glucose: 151.33 mg/dL

## 2021-10-15 LAB — I-STAT BETA HCG BLOOD, ED (MC, WL, AP ONLY): I-stat hCG, quantitative: <5 m[IU]/mL (ref <5)

## 2021-10-15 LAB — ACETAMINOPHEN LEVEL: Acetaminophen (Tylenol), Serum: <10 ug/mL — ABNORMAL LOW (ref 10–30)

## 2021-10-15 LAB — TROPONIN I (HIGH SENSITIVITY)
Troponin I (High Sensitivity): 11 ng/L (ref <18)
Troponin I (High Sensitivity): 11 ng/L (ref <18)

## 2021-10-15 LAB — FOLATE: Folate: 10.5 ng/mL (ref >5.9)

## 2021-10-15 MED ORDER — HEPARIN SODIUM (PORCINE) 5000 UNIT/ML IJ SOLN
5000.0000 [IU] | Freq: Three times a day (TID) | INTRAMUSCULAR | Status: DC
  Administered 2021-10-15 – 2021-10-16 (×5): 5000 [IU] via SUBCUTANEOUS
  Filled 2021-10-15 (×4): qty 1

## 2021-10-15 MED ORDER — AMLODIPINE BESYLATE 10 MG PO TABS
10.0000 mg | ORAL_TABLET | Freq: Every day | ORAL | Status: DC
  Administered 2021-10-15 – 2021-10-16 (×2): 10 mg via ORAL
  Filled 2021-10-15: qty 1
  Filled 2021-10-15: qty 2

## 2021-10-15 MED ORDER — INSULIN ASPART 100 UNIT/ML IJ SOLN
0.0000 [IU] | Freq: Three times a day (TID) | INTRAMUSCULAR | Status: DC
  Administered 2021-10-15: 5 [IU] via SUBCUTANEOUS
  Administered 2021-10-16: 3 [IU] via SUBCUTANEOUS

## 2021-10-15 MED ORDER — QUETIAPINE FUMARATE 25 MG PO TABS
50.0000 mg | ORAL_TABLET | Freq: Every day | ORAL | Status: DC
  Administered 2021-10-15: 50 mg via ORAL
  Filled 2021-10-15: qty 2

## 2021-10-15 MED ORDER — HYDRALAZINE HCL 20 MG/ML IJ SOLN
10.0000 mg | Freq: Once | INTRAMUSCULAR | Status: AC
  Administered 2021-10-15: 10 mg via INTRAVENOUS
  Filled 2021-10-15: qty 1

## 2021-10-15 MED ORDER — CARVEDILOL 3.125 MG PO TABS: 3.1250 mg | ORAL_TABLET | Freq: Two times a day (BID) | ORAL | Status: DC

## 2021-10-15 MED ORDER — ACETAMINOPHEN 325 MG PO TABS: 650.0000 mg | ORAL_TABLET | Freq: Four times a day (QID) | ORAL | Status: DC | PRN

## 2021-10-15 MED ORDER — ACETAMINOPHEN 650 MG RE SUPP: 650.0000 mg | Freq: Four times a day (QID) | RECTAL | Status: DC | PRN

## 2021-10-15 MED ORDER — SODIUM CHLORIDE 0.9 % IV BOLUS
1000.0000 mL | Freq: Once | INTRAVENOUS | Status: AC
  Administered 2021-10-15: 1000 mL via INTRAVENOUS
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
## 2021-10-15 NOTE — ED Notes (Signed)
[employer].

## 2021-10-15 NOTE — ED Notes (Signed)
Belongings placed in locker 8
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
## 2021-10-15 NOTE — ED Notes (Signed)
Report called to Charge Nurse [facility].  Comptroller.  Dr Leonette Monarch accepting MD.

## 2021-10-15 NOTE — ED Notes (Signed)
Recent blood draw at [facility] yesterday, will await provider orders, standing orders removed
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
## 2021-10-16 ENCOUNTER — Other Ambulatory Visit (HOSPITAL_COMMUNITY): Payer: Self-pay

## 2021-10-16 DIAGNOSIS — R45851 Suicidal ideations: Secondary | ICD-10-CM

## 2021-10-16 DIAGNOSIS — I160 Hypertensive urgency: Secondary | ICD-10-CM

## 2021-10-16 DIAGNOSIS — N184 Chronic kidney disease, stage 4 (severe): Secondary | ICD-10-CM

## 2021-10-16 DIAGNOSIS — Z87891 Personal history of nicotine dependence: Secondary | ICD-10-CM

## 2021-10-16 LAB — RENAL FUNCTION PANEL
Albumin: 3.1 g/dL — ABNORMAL LOW (ref 3.5–5.0)
Anion gap: 7 (ref 5–15)
BUN: 58 mg/dL — ABNORMAL HIGH (ref 6–20)
CO2: 23 mmol/L (ref 22–32)
Calcium: 8.5 mg/dL — ABNORMAL LOW (ref 8.9–10.3)
Chloride: 107 mmol/L (ref 98–111)
Creatinine, Ser: 4.8 mg/dL — ABNORMAL HIGH (ref 0.44–1.00)
GFR, Estimated: 10 mL/min — ABNORMAL LOW (ref >60)
Glucose, Bld: 117 mg/dL — ABNORMAL HIGH (ref 70–99)
Phosphorus: 4 mg/dL (ref 2.5–4.6)
Potassium: 3.8 mmol/L (ref 3.5–5.1)
Sodium: 137 mmol/L (ref 135–145)

## 2021-10-16 LAB — GLUCOSE, CAPILLARY
Glucose-Capillary: 107 mg/dL — ABNORMAL HIGH (ref 70–99)
Glucose-Capillary: 167 mg/dL — ABNORMAL HIGH (ref 70–99)
Glucose-Capillary: 104 mg/dL — ABNORMAL HIGH (ref 70–99)

## 2021-10-16 LAB — RAPID URINE DRUG SCREEN, HOSP PERFORMED
Amphetamines: NOT DETECTED
Barbiturates: NOT DETECTED
Benzodiazepines: NOT DETECTED
Cocaine: NOT DETECTED
Opiates: NOT DETECTED
Tetrahydrocannabinol: NOT DETECTED

## 2021-10-16 LAB — HIV ANTIBODY (ROUTINE TESTING W REFLEX): HIV Screen 4th Generation wRfx: NONREACTIVE

## 2021-10-16 MED ORDER — FLUTICASONE PROPIONATE 50 MCG/ACT NA SUSP
1.0000 | Freq: Every day | NASAL | 0 refills | Status: AC
  Filled 2021-10-16: qty 16, 30d supply, fill #0

## 2021-10-16 MED ORDER — QUETIAPINE FUMARATE 50 MG PO TABS
50.0000 mg | ORAL_TABLET | Freq: Every day | ORAL | 5 refills | Status: DC
  Filled 2021-10-16: qty 30, 30d supply, fill #0

## 2021-10-16 MED ORDER — AMLODIPINE BESYLATE 10 MG PO TABS
10.0000 mg | ORAL_TABLET | Freq: Every day | ORAL | 5 refills | Status: DC
  Filled 2021-10-16: qty 30, 30d supply, fill #0

## 2021-10-16 NOTE — Consult Note (Signed)
[facility] Face-to-Face Psychiatry Consult   Reason for Consult: Recently endorsed suicidal ideations, transfer from [facility]. Referring Physician: Stormy Fabian Patient Identification: Lorraine Turner MRN:  [id number] Principal Diagnosis: Asymptomatic hypertensive urgency Diagnosis:  Principal Problem:   Asymptomatic hypertensive urgency   Total Time spent with patient: 30 minutes  Subjective:   Lorraine Turner is a 61 y.o. female patient admitted with a symptomatic hypertensive urgency, acute kidney injury.  Patient presented as a transfer from [facility], where she was being treated since September 5 for worsening depression and suicidal ideations following an argument with her daughter.  Patient has a history of schizoaffective disorder, depression in which she was being managed with Risperdal 1 mg p.o. nightly, buspirone 5 mg p.o. twice daily, gabapentin 10 Seroquel 50 mg p.o. twice daily. Patient reports compliance with the above medication, up until recent medical admission in which her trazodone and gabapentin were held due to elevated creatinine.   On today's evaluation patient is alert and oriented, very pleasant, jovial interaction and appears to be doing much better than upon admission.  She is observed to be laying in bed, and interacting well with her safety sitter at bedside.   Patient denies any depressive symptoms today, anxiety, mania, paranoia, psychosis.  She reports her last suicidal thought was on Friday and admission to [facility]. She does not appear to be responding to internal stimuli, external stimuli, and or exhibiting delusional thought disorder.  She furthermore denies any suicidal ideations, homicidal ideations, and/or auditory or visual hallucinations.  She is very engaging, answers all questions appropriately, and has a linear conversation with provider.  We did discuss that based off her current symptoms and clinical  presentation, and denial of suicidality she no longer meets criteria for inpatient psychiatric admission.   She no longer endorses any thoughts of suicidal ideations, and does not appear to be engaging in any self-harm, she furthermore she is able to contract for safety while on the unit.   While future psychiatric events cannot be accurately predicted, the patient does not currently require further acute inpatient psychiatric care and does not currently meet [facility] involuntary commitment criteria.   HPI:  Lorraine Turner is a 61 yo F with a PMH T2DM, poorly controlled hypertension, CKD stage 4, anxiety, depression, and schizoaffective disorder (not currently on any psychiatric medications) who presents from the [facility] for medical clearance, found to have elevated blood pressure and worsening renal function on labs.    Past Psychiatric History: Schizoaffective  D/O, Depression, Anxiety, suicide attempts.  Recent visits to [facility]  Risk to Self: Denies Risk to Others: Denies Prior Inpatient Therapy: [facility] October 2022, September 30, 2020, July 2022, July 2020 Prior Outpatient Therapy: [facility] in [place] and wellness  Past Medical History:  Past Medical History:  Diagnosis Date   AKI (acute kidney injury) ([place]) 09/12/2018   Anxiety    Bipolar 1 disorder ([place])    Depression    Diabetes mellitus without complication ([place])    Diarrhea 11/21/2020   Gallstones    Hyperlipidemia    Hypertension    MDD (major depressive disorder), recurrent episode, moderate ([place]) 11/18/2020   Neuropathy    Schizophrenia ([place])    Seizures ([place])    X1- febrile seizure as a child-none since   Suicide ideation 09/12/2018    Past Surgical History:  Procedure Laterality Date   CESAREAN SECTION  05/1998   X 1   CHOLECYSTECTOMY N/A 03/27/2016   Procedure: LAPAROSCOPIC CHOLECYSTECTOMY;  Surgeon: Olean Ree, MD;   Location: [facility] ORS;  Service: General;  Laterality: N/A;   TONSILLECTOMY AND ADENOIDECTOMY     age 66   TUBAL LIGATION     Family History:  Family History  Problem Relation Age of Onset   Diabetes Mother    Heart disease Mother    Kidney disease Mother    Stroke Mother    Hyperlipidemia Mother    Diabetes Maternal Aunt    Family Psychiatric  History: Denies Social History:  Social History   Substance and Sexual Activity  Alcohol Use Not Currently   Alcohol/week: 1.0 standard drink of alcohol   Types: 1 Cans of beer per week   Comment: One can of beer every 2-3 months.      Social History   Substance and Sexual Activity  Drug Use No    Social History   Socioeconomic History   Marital status: Single    Spouse name: Not on file   Number of children: Not on file   Years of education: Not on file   Highest education level: Not on file  Occupational History   Not on file  Tobacco Use   Smoking status: Former    Types: Cigarettes    Quit date: 03/14/2013    Years since quitting: 8.5   Smokeless tobacco: Never   Tobacco comments:    1 cigarette1-2 times year, only when she gets stressed  Vaping Use   Vaping Use: Never used  Substance and Sexual Activity   Alcohol use: Not Currently    Alcohol/week: 1.0 standard drink of alcohol    Types: 1 Cans of beer per week    Comment: One can of beer every 2-3 months.    Drug use: No   Sexual activity: Not Currently  Other Topics Concern   Not on file  Social History Narrative   ** Merged History Encounter **       Social Determinants of Health   Financial Resource Strain: Not on file  Food Insecurity: Not on file  Transportation Needs: Not on file  Physical Activity: Not on file  Stress: Not on file  Social Connections: Not on file   Additional Social History:    Allergies:   Allergies  Allergen Reactions   Lisinopril Other (See Comments)    Patient does not wish to take this medication (she heard that it can  make your kidneys worse)   Amoxicillin Rash    Labs:  Results for orders placed or performed during the hospital encounter of 10/15/21 (from the past 48 hour(s))  Comprehensive metabolic panel     Status: Abnormal   Collection Time: 10/15/21  5:20 AM  Result Value Ref Range   Sodium 138 135 - 145 mmol/L   Potassium 4.0 3.5 - 5.1 mmol/L   Chloride 104 98 - 111 mmol/L   CO2 23 22 - 32 mmol/L   Glucose, Bld 145 (H) 70 - 99 mg/dL    Comment: Glucose reference range applies only to samples taken after fasting for at least 8 hours.   BUN 64 (H) 6 - 20 mg/dL   Creatinine, Ser 5.17 (H) 0.44 - 1.00 mg/dL   Calcium 8.9 8.9 - 10.3 mg/dL   Total Protein 7.4 6.5 - 8.1 g/dL   Albumin 3.7 3.5 - 5.0 g/dL   AST 19 15 - 41 U/L   ALT 14 0 -  44 U/L   Alkaline Phosphatase 68 38 - 126 U/L   Total Bilirubin 0.5 0.3 - 1.2 mg/dL   GFR, Estimated 9 (L) >60 mL/min    Comment: (NOTE) Calculated using the CKD-EPI Creatinine Equation (2021)    Anion gap 11 5 - 15    Comment: Performed at [facility] [address], [place], [place] [address]  CBC with Differential     Status: Abnormal   Collection Time: 10/15/21  5:20 AM  Result Value Ref Range   WBC 7.2 4.0 - 10.5 K/uL   RBC 3.18 (L) 3.87 - 5.11 MIL/uL   Hemoglobin 9.6 (L) 12.0 - 15.0 g/dL   HCT 30.0 (L) 36.0 - 46.0 %   MCV 94.3 80.0 - 100.0 fL   MCH 30.2 26.0 - 34.0 pg   MCHC 32.0 30.0 - 36.0 g/dL   RDW 12.4 11.5 - 15.5 %   Platelets 272 150 - 400 K/uL   nRBC 0.0 0.0 - 0.2 %   Neutrophils Relative % 55 %   Neutro Abs 3.9 1.7 - 7.7 K/uL   Lymphocytes Relative 31 %   Lymphs Abs 2.2 0.7 - 4.0 K/uL   Monocytes Relative 9 %   Monocytes Absolute 0.6 0.1 - 1.0 K/uL   Eosinophils Relative 4 %   Eosinophils Absolute 0.3 0.0 - 0.5 K/uL   Basophils Relative 1 %   Basophils Absolute 0.1 0.0 - 0.1 K/uL   Immature Granulocytes 0 %   Abs Immature Granulocytes 0.03 0.00 - 0.07 K/uL    Comment: Performed at [facility] [address]  [place], [place] [address]  Troponin I (High Sensitivity)     Status: None   Collection Time: 10/15/21  5:20 AM  Result Value Ref Range   Troponin I (High Sensitivity) 11 <18 ng/L    Comment: (NOTE) Elevated high sensitivity troponin I (hsTnI) values and significant  changes across serial measurements may suggest ACS but many other  chronic and acute conditions are known to elevate hsTnI results.  Refer to the "Links" section for chest pain algorithms and additional  guidance. Performed at [facility], [place] [address], [place], [place] [address]   Phosphorus     Status: Abnormal   Collection Time: 10/15/21  5:20 AM  Result Value Ref Range   Phosphorus 4.7 (H) 2.5 - 4.6 mg/dL    Comment: Performed at [facility] [address], [place], [place] [address]  Magnesium     Status: Abnormal   Collection Time: 10/15/21  5:20 AM  Result Value Ref Range   Magnesium 2.5 (H) 1.7 - 2.4 mg/dL    Comment: Performed at [facility] [address], [place], [place] [address]  Hemoglobin A1c     Status: Abnormal   Collection Time: 10/15/21  5:20 AM  Result Value Ref Range   Hgb A1c MFr Bld 6.9 (H) 4.8 - 5.6 %    Comment: (NOTE) Pre diabetes:          5.7%-6.4%  Diabetes:              >6.4%  Glycemic control for   <7.0% adults with diabetes    Mean Plasma Glucose 151.33 mg/dL    Comment: Performed at [facility] [address], [place], [place] [address]  Ferritin     Status: None   Collection Time: 10/15/21  5:20 AM  Result Value Ref Range   Ferritin 123  11 - 307 ng/mL    Comment: Performed at [facility], [place] [address] [place], [place] [address]  Iron and TIBC     Status: None   Collection Time: 10/15/21  5:20 AM  Result Value Ref Range   Iron 69 28 - 170 ug/dL   TIBC 304 250 - 450 ug/dL   Saturation Ratios 23 10.4 - 31.8 %   UIBC 235 ug/dL    Comment: Performed at [facility], [place] [address], [place], [place] [address]  Vitamin B12      Status: None   Collection Time: 10/15/21  5:20 AM  Result Value Ref Range   Vitamin B-12 382 180 - 914 pg/mL    Comment: (NOTE) This assay is not validated for testing neonatal or myeloproliferative syndrome specimens for Vitamin B12 levels. Performed at [facility], [place] [address] [place], [place] [address]   Folate     Status: None   Collection Time: 10/15/21  5:20 AM  Result Value Ref Range   Folate 10.5 >5.9 ng/mL    Comment: Performed at [facility] [address], [place], [place] [address]  Troponin I (High Sensitivity)     Status: None   Collection Time: 10/15/21  7:49 AM  Result Value Ref Range   Troponin I (High Sensitivity) 11 <18 ng/L    Comment: (NOTE) Elevated high sensitivity troponin I (hsTnI) values and significant  changes across serial measurements may suggest ACS but many other  chronic and acute conditions are known to elevate hsTnI results.  Refer to the "Links" section for chest pain algorithms and additional  guidance. Performed at [facility], [place] [address], [place], [place] [address]   Ethanol     Status: None   Collection Time: 10/15/21  7:49 AM  Result Value Ref Range   Alcohol, Ethyl (B) <10 <10 mg/dL    Comment: (NOTE) Lowest detectable limit for serum alcohol is 10 mg/dL.  For medical purposes only. Performed at [facility], [place] [address], [place], [place] [address]   Acetaminophen level     Status: Abnormal   Collection Time: 10/15/21  7:49 AM  Result Value Ref Range   Acetaminophen (Tylenol), Serum <10 (L) 10 - 30 ug/mL    Comment: (NOTE) Therapeutic concentrations vary significantly. A range of 10-30 ug/mL  may be an effective concentration for many patients. However, some  are best treated at concentrations outside of this range. Acetaminophen concentrations >150 ug/mL at 4 hours after ingestion  and >50 ug/mL at 12 hours after ingestion are often associated with  toxic reactions.  Performed at [facility], [place] [address] [place], [place] [address]   Salicylate level     Status: Abnormal   Collection Time: 10/15/21  7:49 AM  Result Value Ref Range   Salicylate Lvl <2.5 (L) 7.0 - 30.0 mg/dL    Comment: Performed at [facility] [address], [place], [place] [address]  I-Stat beta hCG blood, ED     Status: None   Collection Time: 10/15/21  7:54 AM  Result Value Ref Range   I-stat hCG, quantitative <5.0 <5 mIU/mL   Comment 3            Comment:   GEST. AGE      CONC.  (mIU/mL)   <=1 WEEK        5 - 50     2 WEEKS  50 - 500     3 WEEKS       100 - 10,000     4 WEEKS     1,000 - 30,000        FEMALE AND NON-PREGNANT FEMALE:     LESS THAN 5 mIU/mL   POC CBG, ED     Status: Abnormal   Collection Time: 10/15/21  9:19 AM  Result Value Ref Range   Glucose-Capillary 149 (H) 70 - 99 mg/dL    Comment: Glucose reference range applies only to samples taken after fasting for at least 8 hours.  Urinalysis, Routine w reflex microscopic     Status: Abnormal   Collection Time: 10/15/21  9:20 AM  Result Value Ref Range   Color, Urine STRAW (A) YELLOW   APPearance CLEAR CLEAR   Specific Gravity, Urine 1.011 1.005 - 1.030   pH 5.0 5.0 - 8.0   Glucose, UA NEGATIVE NEGATIVE mg/dL   Hgb urine dipstick SMALL (A) NEGATIVE   Bilirubin Urine NEGATIVE NEGATIVE   Ketones, ur NEGATIVE NEGATIVE mg/dL   Protein, ur 100 (A) NEGATIVE mg/dL   Nitrite NEGATIVE NEGATIVE   Leukocytes,Ua NEGATIVE NEGATIVE   RBC / HPF 0-5 0 - 5 RBC/hpf   WBC, UA 0-5 0 - 5 WBC/hpf   Bacteria, UA NONE SEEN NONE SEEN   Squamous Epithelial / LPF 0-5 0 - 5    Comment: Performed at [facility] [address] [place], [place] [address]  CBG monitoring, ED     Status: Abnormal   Collection Time: 10/15/21 12:17 PM  Result Value Ref Range   Glucose-Capillary 215 (H) 70 - 99 mg/dL    Comment: Glucose reference range applies only to samples taken after fasting for at least 8 hours.  Glucose, capillary      Status: None   Collection Time: 10/15/21  4:44 PM  Result Value Ref Range   Glucose-Capillary 88 70 - 99 mg/dL    Comment: Glucose reference range applies only to samples taken after fasting for at least 8 hours.  Glucose, capillary     Status: Abnormal   Collection Time: 10/15/21  9:11 PM  Result Value Ref Range   Glucose-Capillary 193 (H) 70 - 99 mg/dL    Comment: Glucose reference range applies only to samples taken after fasting for at least 8 hours.  Renal function panel     Status: Abnormal   Collection Time: 10/16/21  4:51 AM  Result Value Ref Range   Sodium 137 135 - 145 mmol/L   Potassium 3.8 3.5 - 5.1 mmol/L   Chloride 107 98 - 111 mmol/L   CO2 23 22 - 32 mmol/L   Glucose, Bld 117 (H) 70 - 99 mg/dL    Comment: Glucose reference range applies only to samples taken after fasting for at least 8 hours.   BUN 58 (H) 6 - 20 mg/dL   Creatinine, Ser 4.80 (H) 0.44 - 1.00 mg/dL   Calcium 8.5 (L) 8.9 - 10.3 mg/dL   Phosphorus 4.0 2.5 - 4.6 mg/dL   Albumin 3.1 (L) 3.5 - 5.0 g/dL   GFR, Estimated 10 (L) >60 mL/min    Comment: (NOTE) Calculated using the CKD-EPI Creatinine Equation (2021)    Anion gap 7 5 - 15    Comment: Performed at [facility] [address], [place], [place] [address]  Glucose, capillary     Status: Abnormal   Collection Time: 10/16/21  8:15 AM  Result Value  Ref Range   Glucose-Capillary 107 (H) 70 - 99 mg/dL    Comment: Glucose reference range applies only to samples taken after fasting for at least 8 hours.    Current Facility-Administered Medications  Medication Dose Route Frequency Provider Last Rate Last Admin   acetaminophen (TYLENOL) tablet 650 mg  650 mg Oral Q6H PRN Rick Duff, MD       Or   acetaminophen (TYLENOL) suppository 650 mg  650 mg Rectal Q6H PRN Rick Duff, MD       amLODipine (NORVASC) tablet 10 mg  10 mg Oral Daily Rick Duff, MD   10 mg at 10/16/21 0949   heparin injection 5,000 Units  5,000 Units  Subcutaneous Q8H Rick Duff, MD   5,000 Units at 10/16/21 0350   insulin aspart (novoLOG) injection 0-15 Units  0-15 Units Subcutaneous TID WC Stormy Fabian, MD   5 Units at 10/15/21 1321   QUEtiapine (SEROQUEL) tablet 50 mg  50 mg Oral QHS Serita Butcher, MD   50 mg at 10/15/21 2229    Musculoskeletal: Strength & Muscle Tone: within normal limits Gait & Station: normal Patient leans: N/A            Psychiatric Specialty Exam:  Presentation  General Appearance: Disheveled  Eye Contact:Good  Speech:Clear and Coherent  Speech Volume:Normal  Handedness:Right   Mood and Affect  Mood:Anxious  Affect:Congruent   Thought Process  Thought Processes:Coherent  Descriptions of Associations:Intact  Orientation:Full (Time, Place and Person)  Thought Content:Logical  History of Schizophrenia/Schizoaffective disorder:Yes  Duration of Psychotic Symptoms:Greater than six months Hallucinations:No data recorded  Ideas of Reference:None  Suicidal Thoughts:No data recorded  Homicidal Thoughts:No data recorded   Sensorium  Memory:Recent Poor; Immediate Good  Judgment:Fair  Insight:Fair   Executive Functions  Concentration:Good  Attention Span:Fair  [place]  Language:Good   Psychomotor Activity  Psychomotor Activity:No data recorded   Assets  Assets:Desire for Improvement; Resilience; Social Support   Sleep  Sleep:No data recorded   Physical Exam: Physical Exam Vitals and nursing note reviewed.  Constitutional:      Appearance: Normal appearance. She is normal weight.  HENT:     Head: Normocephalic.  Eyes:     Conjunctiva/sclera: Conjunctivae normal.     Pupils: Pupils are equal, round, and reactive to light.  Neurological:     General: No focal deficit present.     Mental Status: She is alert and oriented to person, place, and time. Mental status is at baseline.  Psychiatric:        Mood and  Affect: Mood normal.        Behavior: Behavior normal.        Thought Content: Thought content normal.        Judgment: Judgment normal.    Review of Systems  Psychiatric/Behavioral: Negative.    All other systems reviewed and are negative.  Blood pressure (!) 142/65, pulse (!) 58, temperature 98.4 F (36.9 C), temperature source Oral, resp. rate 18, height 5\' 2"  (1.575 m), weight 58.9 kg, SpO2 100 %. Body mass index is 23.75 kg/m.  Treatment Plan Summary: Plan Will continue home medication. -Patient is no longer a danger to herself Will recommend discontinuation of safety sitter. -Patient does identify as homeless, recommend working closely with TOC to identify safety options and disposition.  Patient was provided additional resources for housing prior to transfer from [place] referral for [facility] For medication management and  ongoing therapy.  Disposition: No evidence of imminent risk to self or others at present.   Patient does not meet criteria for psychiatric inpatient admission. Supportive therapy provided about ongoing stressors. Refer to IOP. Discussed crisis plan, support from social network, calling 911, coming to the Emergency Department, and calling Suicide Hotline.  Suella Broad, FNP 10/16/2021 10:12 AM

## 2021-10-16 NOTE — TOC Transition Note (Signed)
Transition of Care [facility]) - CM/SW Discharge Note   Patient Details  Name: GERILYN STARGELL MRN: [id number] Date of Birth: 1960/03/19  [facility] CM/SW Contact:  Curlene Labrum, RN Phone Number: 10/16/2021, 4:07 PM   Clinical Narrative:    CM met with the patient prior to discharge to home.  The patient was seen by psychiatry and has been cleared to discharge to home with outpatient follow up.  The patient plans to follow up with [facility] and plans to schedule an outpatient appointment.  Placed instructions in discharge summary.  The patient is receiving discharge medications through [facility] - 12 dollars is owed and patient will be billed.  The patient has belonging locked up with security - bedside nursing is aware and will obtain for the patient.  The patient was given bus passes for home.  CM will continue to follow the patient for discharge to home.   Final next level of care: Home/Self Care Barriers to Discharge: No Barriers Identified   Patient Goals and CMS Choice Patient states their goals for this hospitalization and ongoing recovery are:: To get better and go home. CMS Medicare.gov Compare Post Acute Care list provided to:: Patient Choice offered to / list presented to : Patient  Discharge Placement                       Discharge Plan and Services   Discharge Planning Services: CM Consult                                 Social Determinants of Health (SDOH) Interventions     Readmission Risk Interventions    11/26/2020   11:18 AM 11/24/2020    9:15 AM  Readmission Risk Prevention Plan  Transportation Screening Complete Complete  PCP or Specialist Appt within 3-5 Days Complete Complete  HRI or Home Care Consult Complete Complete  Social Work Consult for [place] Planning/Counseling Complete Complete  Palliative Care Screening Not Applicable Not Applicable  Medication Review Designer, fashion/clothing) Complete Complete

## 2021-10-16 NOTE — Discharge Summary (Addendum)
Name: Lorraine Turner MRN: [id number] DOB: 1960/09/09 61 y.o. PCP: [name], MD  Date of Admission: 10/15/2021  2:38 AM Date of Discharge: 10/16/2021 Attending Physician: Dr.  Cain Sieve  Discharge Diagnosis: Principal Problem:   Asymptomatic hypertensive urgency Active Problems:   Suicidal ideation   Stage 4 chronic kidney disease [facility])    Discharge Medications: Allergies as of 10/16/2021       Reactions   Lisinopril Other (See Comments)   Patient does not wish to take this medication (she heard that it can make your kidneys worse)   Amoxicillin Rash        Medication List     TAKE these medications    amLODipine 10 MG tablet Commonly known as: NORVASC Take 1 tablet (10 mg total) by mouth daily. Start taking on: October 17, 2021   fluticasone 50 MCG/ACT nasal spray Commonly known as: FLONASE Place 1 spray into both nostrils daily.   QUEtiapine 50 MG tablet Commonly known as: SEROQUEL Take 1 tablet (50 mg total) by mouth at bedtime.        Disposition and follow-up:   [name] was discharged from [facility] in Stable condition.  At the hospital follow up visit please address:  1.  Follow-up:  A. HTN - came with severe asymptomatic HTN with SBP in the 200s, improved with IV hydralazine and oral amlodipine. D/c on 10 mg amlodipine. Please check BP and adjust medication at your discretion.     B. AKI with CKD - came with elevated creatinine of 5.17 and down trended to 4.80. Please check BMP and provide medication at your discretion.   C. Schizoaffective disorder- refer to outpatient psychiatrist and therapist   D. T2DM - A1c most recent reading is 6.9 and currently not on medication. Please check BMP and provide medications at your discretion.  2.  Labs / imaging needed at time of follow-up: BMP  3.  Pending labs/ test needing follow-up: none  Follow-up Appointments:  Follow-up Information     [name], MD.  Schedule an appointment as soon as possible for a visit in 1 week(s).   Specialty: Family Medicine Why: Please call to make a hospital follow up appointment Contact information: [address] [place]  [phone]                PCP with [facility] by problem list:  #Asymptomatic severe hypertension Patient has been nonadherent with her antihypertensives for at least the past 2 years.  She states she does not take her medications because she saw an AD detailing the adverse effects of antihypertensives. She also has not followed with her PCP in some time. On admission, her BP was elevated to 332R systolic, patient was asymptomatic. Her troponins were negative and her EKG showed stable ST changes. She received a 1x dose of IV hydralazine 10mg  and her blood pressures decreased to the 518A systolic. Given patient is asymptomatic and no current indications for rapidly decreasing her blood pressure. Started amlodipine 10mg  daily. Most recent BP is 142/65. Initiation of other antihypertensives are limited due to her renal function.  - Continue amlodipine 10 mg daily   #CKD IV  #Likely AOCD Patient has worsening in her serum creatinine this hospitalization but minimal change in her GFR. Her last BMP was 12/2020. Suspect she has progression of her CKD, fortunately she is not oliguric at this time nor have acute indications for initiation  of HD. Current creatinine is 4.80. - F/U with PCP    #T2DM  Poorly controlled last A1c 6.9 (11/19/20) She is not able to be on metformin due to her renal function.  - F/U with PCP  #Schizoaffective disorder #Anxiety Currently not reporting SI, HI, and AVH. She plans to seek assistance with outpatient psychiatry and therapy. - Continue Seroquel 50 mg QHS  Subjective:  [name] is a 61 yo F with a PMH T2DM, poorly controlled hypertension, CKD stage 4, anxiety, depression, and  schizoaffective disorder (not currently on any psychiatric medications) who presents from the [facility] for medical clearance, found to have elevated blood pressure and worsening renal function on labs. Today, patient is feeling good. Denied SI, HI, AVH. Plans to follow-up with her PCP and go to [facility] for psychiatric services. Denied symptoms of chest pain, SOB, dizziness, and vision changes.  Discharge Vitals:   BP (!) 142/65 (BP Location: Left Arm)   Pulse (!) 58   Temp 98.4 F (36.9 C) (Oral)   Resp 18   Ht 5\' 2"  (1.575 m)   Wt 58.9 kg   SpO2 100%   BMI 23.75 kg/m  Discharge exam: General: Pleasant, well-appearing in bed. No acute distress. CV: RRR. Mild systolic murmur. No rubs, or gallops. No LE edema Pulmonary: Lungs CTAB. Normal effort. No wheezing or rales. Abdominal: Soft, nontender, nondistended. Normal bowel sounds. Skin: Warm and dry. No obvious rash or lesions. Neuro: A&Ox3. Moves all extremities. Normal sensation. No focal deficit. Psych: Normal mood and affect   Pertinent Labs, Studies, and Procedures:     Latest Ref Rng & Units 10/15/2021    5:20 AM 10/13/2021    8:15 PM 12/21/2020    5:48 AM  CBC  WBC 4.0 - 10.5 K/uL 7.2  5.7  11.0   Hemoglobin 12.0 - 15.0 g/dL 9.6  9.7  11.6   Hematocrit 36.0 - 46.0 % 30.0  28.8  33.1   Platelets 150 - 400 K/uL 272  268  345        Latest Ref Rng & Units 10/16/2021    4:51 AM 10/15/2021    5:20 AM 10/14/2021    9:29 PM  CMP  Glucose 70 - 99 mg/dL 117  145  183   BUN 6 - 20 mg/dL 58  64  64   Creatinine 0.44 - 1.00 mg/dL 4.80  5.17  5.27   Sodium 135 - 145 mmol/L 137  138  140   Potassium 3.5 - 5.1 mmol/L 3.8  4.0  4.4   Chloride 98 - 111 mmol/L 107  104  104   CO2 22 - 32 mmol/L 23  23  26    Calcium 8.9 - 10.3 mg/dL 8.5  8.9  9.3   Total Protein 6.5 - 8.1 g/dL  7.4  7.3   Total Bilirubin 0.3 - 1.2 mg/dL  0.5  0.5   Alkaline Phos 38 - 126 U/L  68  67   AST 15 - 41 U/L  19  18   ALT 0 - 44 U/L  14  15     US RENAL  Result  Date: 10/15/2021 CLINICAL DATA:  Acute kidney injury. EXAM: RENAL / URINARY TRACT ULTRASOUND COMPLETE COMPARISON:  CT scan earlier same day.  Ultrasound exam 09/01/2020 FINDINGS: Right Kidney: Renal measurements: 9.1 x 3.7 x 4.7 cm = volume: 82 mL. Increased echogenicity. No mass or hydronephrosis visualized. Left Kidney: Renal measurements: A 0.5 x 5.1 x 4.3 cm =  volume: 109 mL. Increased echogenicity. No mass or hydronephrosis visualized. Bladder: Appears normal for degree of bladder distention. Other: None. IMPRESSION: Increased echogenicity of the renal parenchyma suggesting medical renal disease. No hydronephrosis. Electronically Signed   By: Misty Stanley M.D.   On: 10/15/2021 11:25     Discharge Instructions: Discharge Instructions     Diet - low sodium heart healthy   Complete by: As directed    Increase activity slowly   Complete by: As directed          Discharge Instructions      DC Instructions: Dear Ms. Kovich, it has been a pleasure caring for you and I am so happy to see you doing well! You were hospitalized for elevated hypertension and creatinine levels and treated for hypertension. Since then you have recovered, your BP and labs have looked well. Psychiatry also saw you due to recent thoughts of SI and recommended you go to [facility] for psychiatric services.  When you are discharged we would like you to do the following:  1. You were started on the following medications in the hospital. Please continue them using these following instructions: - Amlodipine 10 mg   - Seroquel 50 mg QHS  2. We also are prescribing you Flonase 50 mcg/act.   3. Follow-up with your PCP and go to [facility] for psychiatric resources.        Signed: Stormy Fabian, MD 10/16/2021, 3:37 PM   Pager: [phone]

## 2021-10-16 NOTE — Discharge Instructions (Addendum)
DC Instructions: Dear Ms. Montemayor, it has been a pleasure caring for you and I am so happy to see you doing well! You were hospitalized for elevated hypertension and creatinine levels and treated for hypertension. Since then you have recovered, your BP and labs have looked well. Psychiatry also saw you due to recent thoughts of SI and recommended you go to [facility] for psychiatric services.  When you are discharged we would like you to do the following:  1. You were started on the following medications in the hospital. Please continue them using these following instructions: - Amlodipine 10 mg   - Seroquel 50 mg QHS  2. We also are prescribing you Flonase 50 mcg/act.   3. Follow-up with your PCP and go to [facility] for psychiatric resources.

## 2021-10-17 ENCOUNTER — Telehealth: Payer: Self-pay

## 2021-10-17 NOTE — Telephone Encounter (Signed)
Transition Care Management Unsuccessful Follow-up Telephone Call  Date of discharge and from where:  10/16/2021 [facility]  Attempts:  1st Attempt  Reason for unsuccessful TCM follow-up call:  Unable to leave message, voicemail not set up [phone].    Needs to schedule hospital follow up appointment with PCP

## 2021-10-18 ENCOUNTER — Telehealth: Payer: Self-pay

## 2021-10-18 NOTE — Telephone Encounter (Signed)
Transition Care Management Unsuccessful Follow-up Telephone Call  Date of discharge and from where:  10/16/2021 [facility]   Attempts:  2nd Attempt  Reason for unsuccessful TCM follow-up call:  Unable to leave message  voicemail not set up [phone].      Needs to schedule hospital follow up appointment with PCP

## 2021-10-22 ENCOUNTER — Telehealth: Payer: Self-pay

## 2021-10-22 NOTE — Telephone Encounter (Signed)
Transition Care Management Unsuccessful Follow-up Telephone Call  Date of discharge and from where:  10/16/2021 [facility]   Attempts:  3rd Attempt  Reason for unsuccessful TCM follow-up call:  Unable to leave message  voicemail not set up [phone].      Letter sent to patient requesting she contact [facility] to schedule a hospital follow up appointment as we have not been able to reach her.

## 2022-02-06 ENCOUNTER — Encounter (HOSPITAL_COMMUNITY): Payer: Self-pay

## 2022-02-06 ENCOUNTER — Emergency Department (HOSPITAL_COMMUNITY): Payer: Medicaid Other

## 2022-02-06 ENCOUNTER — Inpatient Hospital Stay (HOSPITAL_COMMUNITY)
Admission: EM | Admit: 2022-02-06 | Discharge: 2022-02-13 | DRG: 291 | Disposition: A | Discharge status: Home / Self Care | Payer: Medicaid Other | Attending: Internal Medicine | Admitting: Internal Medicine

## 2022-02-06 ENCOUNTER — Other Ambulatory Visit: Payer: Self-pay

## 2022-02-06 DIAGNOSIS — Z841 Family history of disorders of kidney and ureter: Secondary | ICD-10-CM

## 2022-02-06 DIAGNOSIS — I110 Hypertensive heart disease with heart failure: Principal | ICD-10-CM

## 2022-02-06 DIAGNOSIS — Z880 Allergy status to penicillin: Secondary | ICD-10-CM

## 2022-02-06 DIAGNOSIS — Z820 Family history of epilepsy and other diseases of the nervous system: Secondary | ICD-10-CM

## 2022-02-06 DIAGNOSIS — M898X9 Other specified disorders of bone, unspecified site: Secondary | ICD-10-CM | POA: Diagnosis present

## 2022-02-06 DIAGNOSIS — F419 Anxiety disorder, unspecified: Secondary | ICD-10-CM | POA: Diagnosis present

## 2022-02-06 DIAGNOSIS — Z91138 Patient's unintentional underdosing of medication regimen for other reason: Secondary | ICD-10-CM

## 2022-02-06 DIAGNOSIS — F319 Bipolar disorder, unspecified: Secondary | ICD-10-CM | POA: Diagnosis present

## 2022-02-06 DIAGNOSIS — Z8249 Family history of ischemic heart disease and other diseases of the circulatory system: Secondary | ICD-10-CM

## 2022-02-06 DIAGNOSIS — R0902 Hypoxemia: Secondary | ICD-10-CM | POA: Diagnosis present

## 2022-02-06 DIAGNOSIS — T43596A Underdosing of other antipsychotics and neuroleptics, initial encounter: Secondary | ICD-10-CM | POA: Diagnosis present

## 2022-02-06 DIAGNOSIS — I132 Hypertensive heart and chronic kidney disease with heart failure and with stage 5 chronic kidney disease, or end stage renal disease: Principal | ICD-10-CM | POA: Diagnosis present

## 2022-02-06 DIAGNOSIS — Z87891 Personal history of nicotine dependence: Secondary | ICD-10-CM

## 2022-02-06 DIAGNOSIS — Z888 Allergy status to other drugs, medicaments and biological substances status: Secondary | ICD-10-CM

## 2022-02-06 DIAGNOSIS — I160 Hypertensive urgency: Secondary | ICD-10-CM | POA: Diagnosis present

## 2022-02-06 DIAGNOSIS — I509 Heart failure, unspecified: Secondary | ICD-10-CM

## 2022-02-06 DIAGNOSIS — N179 Acute kidney failure, unspecified: Secondary | ICD-10-CM

## 2022-02-06 DIAGNOSIS — Z9049 Acquired absence of other specified parts of digestive tract: Secondary | ICD-10-CM

## 2022-02-06 DIAGNOSIS — T496X6A Underdosing of otorhinolaryngological drugs and preparations, initial encounter: Secondary | ICD-10-CM | POA: Diagnosis present

## 2022-02-06 DIAGNOSIS — D631 Anemia in chronic kidney disease: Secondary | ICD-10-CM | POA: Diagnosis present

## 2022-02-06 DIAGNOSIS — Z83438 Family history of other disorder of lipoprotein metabolism and other lipidemia: Secondary | ICD-10-CM

## 2022-02-06 DIAGNOSIS — I071 Rheumatic tricuspid insufficiency: Secondary | ICD-10-CM | POA: Diagnosis present

## 2022-02-06 DIAGNOSIS — E785 Hyperlipidemia, unspecified: Secondary | ICD-10-CM | POA: Diagnosis present

## 2022-02-06 DIAGNOSIS — T461X6A Underdosing of calcium-channel blockers, initial encounter: Secondary | ICD-10-CM | POA: Diagnosis present

## 2022-02-06 DIAGNOSIS — Z833 Family history of diabetes mellitus: Secondary | ICD-10-CM

## 2022-02-06 DIAGNOSIS — R7989 Other specified abnormal findings of blood chemistry: Secondary | ICD-10-CM | POA: Diagnosis present

## 2022-02-06 DIAGNOSIS — I5033 Acute on chronic diastolic (congestive) heart failure: Secondary | ICD-10-CM | POA: Diagnosis present

## 2022-02-06 DIAGNOSIS — E1140 Type 2 diabetes mellitus with diabetic neuropathy, unspecified: Secondary | ICD-10-CM | POA: Diagnosis present

## 2022-02-06 DIAGNOSIS — N184 Chronic kidney disease, stage 4 (severe): Secondary | ICD-10-CM | POA: Diagnosis present

## 2022-02-06 DIAGNOSIS — E1122 Type 2 diabetes mellitus with diabetic chronic kidney disease: Secondary | ICD-10-CM | POA: Diagnosis present

## 2022-02-06 DIAGNOSIS — Z5900 Homelessness unspecified: Secondary | ICD-10-CM

## 2022-02-06 DIAGNOSIS — Z794 Long term (current) use of insulin: Secondary | ICD-10-CM

## 2022-02-06 DIAGNOSIS — N185 Chronic kidney disease, stage 5: Secondary | ICD-10-CM

## 2022-02-06 DIAGNOSIS — Z823 Family history of stroke: Secondary | ICD-10-CM

## 2022-02-06 DIAGNOSIS — F259 Schizoaffective disorder, unspecified: Secondary | ICD-10-CM | POA: Diagnosis present

## 2022-02-06 DIAGNOSIS — I10 Essential (primary) hypertension: Secondary | ICD-10-CM | POA: Diagnosis present

## 2022-02-06 DIAGNOSIS — Z1152 Encounter for screening for COVID-19: Secondary | ICD-10-CM

## 2022-02-06 LAB — CBC WITH DIFFERENTIAL/PLATELET
Abs Immature Granulocytes: 0.02 10*3/uL (ref 0.00–0.07)
Basophils Absolute: 0 10*3/uL (ref 0.0–0.1)
Basophils Relative: 0 %
Eosinophils Absolute: 0.2 10*3/uL (ref 0.0–0.5)
Eosinophils Relative: 3 %
HCT: 27.4 % — ABNORMAL LOW (ref 36.0–46.0)
Hemoglobin: 8.8 g/dL — ABNORMAL LOW (ref 12.0–15.0)
Immature Granulocytes: 0 %
Lymphocytes Relative: 14 %
Lymphs Abs: 1.2 10*3/uL (ref 0.7–4.0)
MCH: 30.3 pg (ref 26.0–34.0)
MCHC: 32.1 g/dL (ref 30.0–36.0)
MCV: 94.5 fL (ref 80.0–100.0)
Monocytes Absolute: 1 10*3/uL (ref 0.1–1.0)
Monocytes Relative: 11 %
Neutro Abs: 6.1 10*3/uL (ref 1.7–7.7)
Neutrophils Relative %: 72 %
Platelets: 238 10*3/uL (ref 150–400)
RBC: 2.9 MIL/uL — ABNORMAL LOW (ref 3.87–5.11)
RDW: 13.1 % (ref 11.5–15.5)
WBC: 8.6 10*3/uL (ref 4.0–10.5)
nRBC: 0 % (ref 0.0–0.2)

## 2022-02-06 LAB — BASIC METABOLIC PANEL
Anion gap: 9 (ref 5–15)
BUN: 58 mg/dL — ABNORMAL HIGH (ref 8–23)
CO2: 23 mmol/L (ref 22–32)
Calcium: 9 mg/dL (ref 8.9–10.3)
Chloride: 111 mmol/L (ref 98–111)
Creatinine, Ser: 4.79 mg/dL — ABNORMAL HIGH (ref 0.44–1.00)
GFR, Estimated: 10 mL/min — ABNORMAL LOW (ref >60)
Glucose, Bld: 98 mg/dL (ref 70–99)
Potassium: 4.2 mmol/L (ref 3.5–5.1)
Sodium: 143 mmol/L (ref 135–145)

## 2022-02-06 LAB — BRAIN NATRIURETIC PEPTIDE: B Natriuretic Peptide: 958.7 pg/mL — ABNORMAL HIGH (ref 0.0–100.0)

## 2022-02-06 LAB — RESP PANEL BY RT-PCR (RSV, FLU A&B, COVID)  RVPGX2
Influenza A by PCR: NEGATIVE
Influenza B by PCR: NEGATIVE
Resp Syncytial Virus by PCR: NEGATIVE
SARS Coronavirus 2 by RT PCR: NEGATIVE

## 2022-02-06 MED ORDER — ALBUTEROL SULFATE HFA 108 (90 BASE) MCG/ACT IN AERS: 2.0000 | INHALATION_SPRAY | RESPIRATORY_TRACT | Status: DC | PRN

## 2022-02-06 MED ORDER — AMLODIPINE BESYLATE 5 MG PO TABS
10.0000 mg | ORAL_TABLET | Freq: Once | ORAL | Status: AC
  Administered 2022-02-07: 10 mg via ORAL
  Filled 2022-02-06: qty 2

## 2022-02-06 NOTE — ED Provider Triage Note (Cosign Needed Addendum)
Emergency Medicine Provider Triage Evaluation Note  Lorraine Turner , a 61 y.o. female  was evaluated in triage.  Pt complains of cough and sob. Worse when laying down. Bilat leg swelling. No hx of dvt/pe, recent travel or sx. Is homeless and has been sleeping in her car and notes increased leg swelling since then.  Reports family members with pneumonia and granddaughter with RSV  Review of Systems  Positive:  Negative:   Physical Exam  BP (!) 233/93   Pulse 75   Temp 98.6 F (37 C)   Resp 16   Wt 59.9 kg   SpO2 93%   BMI 24.14 kg/m  Gen:   Awake, no distress   Resp:  Normal effort  MSK:   Moves extremities without difficulty  Other:  RRR, CTAP. 1+ bilat peripheral pitting edema  Medical Decision Making  Medically screening exam initiated at 6:32 PM.  Appropriate orders placed.  Lorraine Turner was informed that the remainder of the evaluation will be completed by another provider, this initial triage assessment does not replace that evaluation, and the importance of remaining in the ED until their evaluation is complete.     Rhae Hammock, PA-C 02/06/22 1833  Hx of HTN   Rhae Hammock, PA-C 02/06/22 1833

## 2022-02-06 NOTE — ED Triage Notes (Signed)
Pt arrives [facility]. Pt is homeless. Reported cough for 3 days. Pt wants to be evaluated for Covid & PNA.

## 2022-02-07 ENCOUNTER — Observation Stay (HOSPITAL_BASED_OUTPATIENT_CLINIC_OR_DEPARTMENT_OTHER): Payer: Medicaid Other

## 2022-02-07 DIAGNOSIS — I5023 Acute on chronic systolic (congestive) heart failure: Secondary | ICD-10-CM | POA: Diagnosis not present

## 2022-02-07 DIAGNOSIS — R7989 Other specified abnormal findings of blood chemistry: Secondary | ICD-10-CM

## 2022-02-07 LAB — CBG MONITORING, ED
Glucose-Capillary: 200 mg/dL — ABNORMAL HIGH (ref 70–99)
Glucose-Capillary: 255 mg/dL — ABNORMAL HIGH (ref 70–99)

## 2022-02-07 LAB — COMPREHENSIVE METABOLIC PANEL
ALT: 22 U/L (ref 0–44)
AST: 19 U/L (ref 15–41)
Albumin: 3 g/dL — ABNORMAL LOW (ref 3.5–5.0)
Alkaline Phosphatase: 64 U/L (ref 38–126)
Anion gap: 10 (ref 5–15)
BUN: 56 mg/dL — ABNORMAL HIGH (ref 8–23)
CO2: 22 mmol/L (ref 22–32)
Calcium: 8 mg/dL — ABNORMAL LOW (ref 8.9–10.3)
Chloride: 108 mmol/L (ref 98–111)
Creatinine, Ser: 4.94 mg/dL — ABNORMAL HIGH (ref 0.44–1.00)
GFR, Estimated: 9 mL/min — ABNORMAL LOW (ref >60)
Glucose, Bld: 211 mg/dL — ABNORMAL HIGH (ref 70–99)
Potassium: 3.5 mmol/L (ref 3.5–5.1)
Sodium: 140 mmol/L (ref 135–145)
Total Bilirubin: 0.8 mg/dL (ref 0.3–1.2)
Total Protein: 6.2 g/dL — ABNORMAL LOW (ref 6.5–8.1)

## 2022-02-07 LAB — URINALYSIS, ROUTINE W REFLEX MICROSCOPIC
Bilirubin Urine: NEGATIVE
Glucose, UA: NEGATIVE mg/dL
Ketones, ur: NEGATIVE mg/dL
Leukocytes,Ua: NEGATIVE
Nitrite: NEGATIVE
Protein, ur: 100 mg/dL — AB
Specific Gravity, Urine: 1.008 (ref 1.005–1.030)
pH: 5 (ref 5.0–8.0)

## 2022-02-07 LAB — ECHOCARDIOGRAM COMPLETE
AR max vel: 2.12 cm2
AV Area VTI: 2.31 cm2
AV Area mean vel: 2.18 cm2
AV Mean grad: 5 mmHg
AV Peak grad: 9.5 mmHg
Ao pk vel: 1.54 m/s
Area-P 1/2: 2.68 cm2
S' Lateral: 2.7 cm
Weight: 2112 oz

## 2022-02-07 LAB — TROPONIN I (HIGH SENSITIVITY)
Troponin I (High Sensitivity): 30 ng/L — ABNORMAL HIGH (ref <18)
Troponin I (High Sensitivity): 23 ng/L — ABNORMAL HIGH (ref <18)

## 2022-02-07 LAB — GLUCOSE, CAPILLARY: Glucose-Capillary: 80 mg/dL (ref 70–99)

## 2022-02-07 LAB — RAPID URINE DRUG SCREEN, HOSP PERFORMED
Amphetamines: NOT DETECTED
Barbiturates: NOT DETECTED
Benzodiazepines: NOT DETECTED
Cocaine: NOT DETECTED
Opiates: NOT DETECTED
Tetrahydrocannabinol: NOT DETECTED

## 2022-02-07 LAB — MAGNESIUM: Magnesium: 2 mg/dL (ref 1.7–2.4)

## 2022-02-07 MED ORDER — ACETAMINOPHEN 650 MG RE SUPP: 650.0000 mg | Freq: Four times a day (QID) | RECTAL | Status: DC | PRN

## 2022-02-07 MED ORDER — ALBUTEROL SULFATE (2.5 MG/3ML) 0.083% IN NEBU: 2.5000 mg | INHALATION_SOLUTION | RESPIRATORY_TRACT | Status: DC | PRN

## 2022-02-07 MED ORDER — INSULIN ASPART 100 UNIT/ML IJ SOLN
0.0000 [IU] | Freq: Three times a day (TID) | INTRAMUSCULAR | Status: DC
  Administered 2022-02-07: 5 [IU] via SUBCUTANEOUS
  Administered 2022-02-08 (×2): 2 [IU] via SUBCUTANEOUS
  Administered 2022-02-09: 1 [IU] via SUBCUTANEOUS
  Administered 2022-02-09: 3 [IU] via SUBCUTANEOUS
  Administered 2022-02-09: 2 [IU] via SUBCUTANEOUS
  Administered 2022-02-10 (×2): 1 [IU] via SUBCUTANEOUS
  Administered 2022-02-11: 2 [IU] via SUBCUTANEOUS
  Administered 2022-02-11: 1 [IU] via SUBCUTANEOUS
  Administered 2022-02-12: 2 [IU] via SUBCUTANEOUS
  Administered 2022-02-12 – 2022-02-13 (×3): 1 [IU] via SUBCUTANEOUS

## 2022-02-07 MED ORDER — ONDANSETRON HCL 4 MG/2ML IJ SOLN: 4.0000 mg | Freq: Four times a day (QID) | INTRAMUSCULAR | Status: DC | PRN

## 2022-02-07 MED ORDER — AMLODIPINE BESYLATE 10 MG PO TABS: 10.0000 mg | ORAL_TABLET | Freq: Every day | ORAL | Status: DC

## 2022-02-07 MED ORDER — QUETIAPINE FUMARATE 50 MG PO TABS
50.0000 mg | ORAL_TABLET | Freq: Every day | ORAL | Status: DC
  Administered 2022-02-07 – 2022-02-12 (×6): 50 mg via ORAL
  Filled 2022-02-07 (×6): qty 1

## 2022-02-07 MED ORDER — ONDANSETRON HCL 4 MG PO TABS: 4.0000 mg | ORAL_TABLET | Freq: Four times a day (QID) | ORAL | Status: DC | PRN

## 2022-02-07 MED ORDER — AMLODIPINE BESYLATE 5 MG PO TABS: 10.0000 mg | ORAL_TABLET | Freq: Once | ORAL | Status: DC

## 2022-02-07 MED ORDER — FUROSEMIDE 10 MG/ML IJ SOLN
40.0000 mg | Freq: Once | INTRAMUSCULAR | Status: AC
  Administered 2022-02-07: 40 mg via INTRAVENOUS
  Filled 2022-02-07: qty 4

## 2022-02-07 MED ORDER — HEPARIN SODIUM (PORCINE) 5000 UNIT/ML IJ SOLN
5000.0000 [IU] | Freq: Three times a day (TID) | INTRAMUSCULAR | Status: DC
  Administered 2022-02-07 – 2022-02-12 (×13): 5000 [IU] via SUBCUTANEOUS
  Filled 2022-02-07 (×17): qty 1

## 2022-02-07 MED ORDER — ACETAMINOPHEN 325 MG PO TABS: 650.0000 mg | ORAL_TABLET | Freq: Four times a day (QID) | ORAL | Status: DC | PRN

## 2022-02-07 MED ORDER — HYDRALAZINE HCL 25 MG PO TABS
25.0000 mg | ORAL_TABLET | Freq: Once | ORAL | Status: AC
  Administered 2022-02-07: 25 mg via ORAL
  Filled 2022-02-07: qty 1
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
## 2022-02-07 NOTE — Progress Notes (Signed)
  Echocardiogram 2D Echocardiogram has been performed.  Lorraine Turner 02/07/2022, 4:23 PM

## 2022-02-07 NOTE — ED Notes (Signed)
Patient resting in bed with eyes closed, no s/s of distress, respirations are even and unlabored, no cough noted since she has been in the bed, will continue to monitor.

## 2022-02-07 NOTE — Hospital Course (Addendum)
Lorraine Turner is a 61 y.o. person living with a PMH of HTN, HLD, CKD IV, bipolar disorder and schizoaffective disorder who presented with shortness of breath and admitted for acute exacerbation of HFpEF secondary to uncontrolled HTN.   HFpEF 2/2 uncontrolled HTN Patient presents with dyspnea, orthopnea and peripheral edema with CXR showing bibasilar consolidation and pleural effusion. BNP elevated. Afebrile and normal WBC. RVP negative. Patient reports increased urine output after initial IV lasix dose in ED. Less dyspnea and leg edema with good O2 saturation on RA. Rales appreciated mainly at right lung base. Will order echo today, no prior echo. Exacerbation likely secondary to uncontrolled hypertension.  -IV lasix 40 mg  -Echo showed EF 55-60%, with moderately elevated pulmonary artery systolic pressure. Diastolic parameters were indeterminate. Moderate tricuspid regurgitation.   Hypertensive urgency HTN Hypertensive with SBP in 220-230 on arrival. Received hydralazine and home amlodipine in ED. BP improved with systolic in 659D. Has been off of home meds for about 3 months.  -decreased amlodipine to 5 mg due to DBP low and drastic effects with restarting amlodipine   CKD IV Creatinine 4.79. Last hospitalization in Sept, creatinine at discharge was 4.8. Still producing urine. Does not follow with nephrology. Will likely need outpatient nephrology f/u.  ***   Elevated troponin Troponin 30>23. Patient denies chest pain. No ischemic changes on EKG. Likely elevated due to demand.    T2DM Last A1c 6.9%. Not on any medications at home. Insulin and glipizide was discontinued at prior hospitalization.  -repeat A1c *** -SSI with meals   Bipolar disorder Schizoaffective disorder  Denies any racing thoughts or hallucinations. Home med include Seroquel but has not been taking it for past 3 months. Patient states either she lost medication due to being unhomed or possibly stolen. Seroquel helps  with her mood and sleep when she was on it.  QTC was prolonged 501 on admission EKG. Repeat EKG today showed QTC 482. Patient states this medication helps significantly with her mood and sleep. Will continue Seroquel with close monitoring.  -Seroquel 50 mg qhs -restart Seroquel 50 mg qhs

## 2022-02-07 NOTE — ED Notes (Signed)
This Rn did not receive transfer of care report from previous RN.

## 2022-02-08 ENCOUNTER — Other Ambulatory Visit (HOSPITAL_COMMUNITY): Payer: Self-pay

## 2022-02-08 DIAGNOSIS — F259 Schizoaffective disorder, unspecified: Secondary | ICD-10-CM | POA: Diagnosis present

## 2022-02-08 DIAGNOSIS — I132 Hypertensive heart and chronic kidney disease with heart failure and with stage 5 chronic kidney disease, or end stage renal disease: Secondary | ICD-10-CM | POA: Diagnosis present

## 2022-02-08 DIAGNOSIS — Z87891 Personal history of nicotine dependence: Secondary | ICD-10-CM | POA: Diagnosis not present

## 2022-02-08 DIAGNOSIS — F319 Bipolar disorder, unspecified: Secondary | ICD-10-CM | POA: Diagnosis present

## 2022-02-08 DIAGNOSIS — Z5900 Homelessness unspecified: Secondary | ICD-10-CM | POA: Diagnosis not present

## 2022-02-08 DIAGNOSIS — Z823 Family history of stroke: Secondary | ICD-10-CM | POA: Diagnosis not present

## 2022-02-08 DIAGNOSIS — E785 Hyperlipidemia, unspecified: Secondary | ICD-10-CM | POA: Diagnosis present

## 2022-02-08 DIAGNOSIS — I130 Hypertensive heart and chronic kidney disease with heart failure and stage 1 through stage 4 chronic kidney disease, or unspecified chronic kidney disease: Secondary | ICD-10-CM | POA: Diagnosis present

## 2022-02-08 DIAGNOSIS — I5033 Acute on chronic diastolic (congestive) heart failure: Secondary | ICD-10-CM | POA: Diagnosis present

## 2022-02-08 DIAGNOSIS — I10 Essential (primary) hypertension: Secondary | ICD-10-CM | POA: Diagnosis not present

## 2022-02-08 DIAGNOSIS — I5031 Acute diastolic (congestive) heart failure: Secondary | ICD-10-CM | POA: Diagnosis not present

## 2022-02-08 DIAGNOSIS — I5030 Unspecified diastolic (congestive) heart failure: Secondary | ICD-10-CM | POA: Diagnosis not present

## 2022-02-08 DIAGNOSIS — E1140 Type 2 diabetes mellitus with diabetic neuropathy, unspecified: Secondary | ICD-10-CM | POA: Diagnosis present

## 2022-02-08 DIAGNOSIS — N184 Chronic kidney disease, stage 4 (severe): Secondary | ICD-10-CM

## 2022-02-08 DIAGNOSIS — I509 Heart failure, unspecified: Secondary | ICD-10-CM

## 2022-02-08 DIAGNOSIS — M898X9 Other specified disorders of bone, unspecified site: Secondary | ICD-10-CM | POA: Diagnosis present

## 2022-02-08 DIAGNOSIS — I071 Rheumatic tricuspid insufficiency: Secondary | ICD-10-CM | POA: Diagnosis present

## 2022-02-08 DIAGNOSIS — I160 Hypertensive urgency: Secondary | ICD-10-CM | POA: Diagnosis present

## 2022-02-08 DIAGNOSIS — D631 Anemia in chronic kidney disease: Secondary | ICD-10-CM | POA: Diagnosis present

## 2022-02-08 DIAGNOSIS — Z1152 Encounter for screening for COVID-19: Secondary | ICD-10-CM | POA: Diagnosis not present

## 2022-02-08 DIAGNOSIS — T461X6A Underdosing of calcium-channel blockers, initial encounter: Secondary | ICD-10-CM | POA: Diagnosis present

## 2022-02-08 DIAGNOSIS — E1122 Type 2 diabetes mellitus with diabetic chronic kidney disease: Secondary | ICD-10-CM | POA: Diagnosis present

## 2022-02-08 DIAGNOSIS — T43596A Underdosing of other antipsychotics and neuroleptics, initial encounter: Secondary | ICD-10-CM | POA: Diagnosis present

## 2022-02-08 DIAGNOSIS — N185 Chronic kidney disease, stage 5: Secondary | ICD-10-CM | POA: Diagnosis present

## 2022-02-08 DIAGNOSIS — Z820 Family history of epilepsy and other diseases of the nervous system: Secondary | ICD-10-CM | POA: Diagnosis not present

## 2022-02-08 DIAGNOSIS — T496X6A Underdosing of otorhinolaryngological drugs and preparations, initial encounter: Secondary | ICD-10-CM | POA: Diagnosis present

## 2022-02-08 DIAGNOSIS — N179 Acute kidney failure, unspecified: Secondary | ICD-10-CM | POA: Diagnosis present

## 2022-02-08 DIAGNOSIS — R0902 Hypoxemia: Secondary | ICD-10-CM | POA: Diagnosis present

## 2022-02-08 DIAGNOSIS — Z794 Long term (current) use of insulin: Secondary | ICD-10-CM | POA: Diagnosis not present

## 2022-02-08 DIAGNOSIS — Z8249 Family history of ischemic heart disease and other diseases of the circulatory system: Secondary | ICD-10-CM | POA: Diagnosis not present

## 2022-02-08 DIAGNOSIS — F419 Anxiety disorder, unspecified: Secondary | ICD-10-CM | POA: Diagnosis present

## 2022-02-08 LAB — BASIC METABOLIC PANEL
Anion gap: 12 (ref 5–15)
Anion gap: 9 (ref 5–15)
Anion gap: 11 (ref 5–15)
BUN: 62 mg/dL — ABNORMAL HIGH (ref 8–23)
BUN: 62 mg/dL — ABNORMAL HIGH (ref 8–23)
BUN: 64 mg/dL — ABNORMAL HIGH (ref 8–23)
CO2: 22 mmol/L (ref 22–32)
CO2: 25 mmol/L (ref 22–32)
CO2: 23 mmol/L (ref 22–32)
Calcium: 8.3 mg/dL — ABNORMAL LOW (ref 8.9–10.3)
Calcium: 8.2 mg/dL — ABNORMAL LOW (ref 8.9–10.3)
Calcium: 8 mg/dL — ABNORMAL LOW (ref 8.9–10.3)
Chloride: 106 mmol/L (ref 98–111)
Chloride: 108 mmol/L (ref 98–111)
Chloride: 105 mmol/L (ref 98–111)
Creatinine, Ser: 5.44 mg/dL — ABNORMAL HIGH (ref 0.44–1.00)
Creatinine, Ser: 5.53 mg/dL — ABNORMAL HIGH (ref 0.44–1.00)
Creatinine, Ser: 5.78 mg/dL — ABNORMAL HIGH (ref 0.44–1.00)
GFR, Estimated: 8 mL/min — ABNORMAL LOW (ref >60)
GFR, Estimated: 8 mL/min — ABNORMAL LOW (ref >60)
GFR, Estimated: 8 mL/min — ABNORMAL LOW (ref >60)
Glucose, Bld: 105 mg/dL — ABNORMAL HIGH (ref 70–99)
Glucose, Bld: 79 mg/dL (ref 70–99)
Glucose, Bld: 203 mg/dL — ABNORMAL HIGH (ref 70–99)
Potassium: 3.6 mmol/L (ref 3.5–5.1)
Potassium: 3.2 mmol/L — ABNORMAL LOW (ref 3.5–5.1)
Potassium: 3.6 mmol/L (ref 3.5–5.1)
Sodium: 140 mmol/L (ref 135–145)
Sodium: 142 mmol/L (ref 135–145)
Sodium: 139 mmol/L (ref 135–145)

## 2022-02-08 LAB — GLUCOSE, CAPILLARY
Glucose-Capillary: 181 mg/dL — ABNORMAL HIGH (ref 70–99)
Glucose-Capillary: 74 mg/dL (ref 70–99)
Glucose-Capillary: 166 mg/dL — ABNORMAL HIGH (ref 70–99)
Glucose-Capillary: 188 mg/dL — ABNORMAL HIGH (ref 70–99)

## 2022-02-08 LAB — CBC
HCT: 21.2 % — ABNORMAL LOW (ref 36.0–46.0)
Hemoglobin: 7.3 g/dL — ABNORMAL LOW (ref 12.0–15.0)
MCH: 31.1 pg (ref 26.0–34.0)
MCHC: 34.4 g/dL (ref 30.0–36.0)
MCV: 90.2 fL (ref 80.0–100.0)
Platelets: 199 10*3/uL (ref 150–400)
RBC: 2.35 MIL/uL — ABNORMAL LOW (ref 3.87–5.11)
RDW: 13 % (ref 11.5–15.5)
WBC: 7.2 10*3/uL (ref 4.0–10.5)
nRBC: 0 % (ref 0.0–0.2)

## 2022-02-08 MED ORDER — QUETIAPINE FUMARATE 50 MG PO TABS
50.0000 mg | ORAL_TABLET | Freq: Every day | ORAL | 0 refills | Status: DC
  Filled 2022-02-08: qty 30, 30d supply, fill #0

## 2022-02-08 MED ORDER — AMLODIPINE BESYLATE 5 MG PO TABS
5.0000 mg | ORAL_TABLET | Freq: Every day | ORAL | 0 refills | Status: DC
  Filled 2022-02-08: qty 30, 30d supply, fill #0

## 2022-02-08 MED ORDER — FUROSEMIDE 10 MG/ML IJ SOLN
40.0000 mg | Freq: Once | INTRAMUSCULAR | Status: AC
  Administered 2022-02-08: 40 mg via INTRAVENOUS
  Filled 2022-02-08: qty 4

## 2022-02-08 MED ORDER — POTASSIUM CHLORIDE CRYS ER 20 MEQ PO TBCR
40.0000 meq | EXTENDED_RELEASE_TABLET | Freq: Two times a day (BID) | ORAL | Status: AC
  Administered 2022-02-08 (×2): 40 meq via ORAL
  Filled 2022-02-08 (×2): qty 2

## 2022-02-08 MED ORDER — AMLODIPINE BESYLATE 5 MG PO TABS
5.0000 mg | ORAL_TABLET | Freq: Every day | ORAL | Status: DC
  Administered 2022-02-08 – 2022-02-11 (×4): 5 mg via ORAL
  Filled 2022-02-08 (×4): qty 1

## 2022-02-08 NOTE — Progress Notes (Addendum)
HD#0 Subjective:   Summary: Lorraine Turner is a 61 y.o. female with PMH of HTN, HLD, CKD IV, bipolar disorder and schizoaffective disorder who presents with dyspnea and admitted for acute CHF secondary to uncontrolled HTN.  Overnight Events: none  Patient reports less dyspnea and leg swelling. No shortness of breath when laying flat in bed. Has been voiding several times. No new concerns. Was upset over her blanket being misplaced.   Objective:  Vital signs in last 24 hours: Vitals:   02/07/22 2317 02/08/22 0513 02/08/22 0728 02/08/22 0831  BP: (!) 149/61 (!) 140/52 (!) 153/57 (!) 166/66  Pulse: 67 65 62 67  Resp: 18 18 17 18   Temp: 98.4 F (36.9 C) 98.8 F (37.1 C) 98.9 F (37.2 C)   TempSrc: Oral Oral Oral   SpO2: 97% 93% 94% 94%  Weight: 63.5 kg 63.5 kg    Height: 5\' 2"  (1.575 m)      Supplemental O2: Room Air SpO2: 94 %   Physical Exam:  Constitutional: resting in bed comfortably, in no acute distress HENT: normocephalic atraumatic Cardiovascular: regular rate and rhythm, JVD present, 2+ LE edema  Pulmonary/Chest: normal work of breathing on room air, mild rales noted over right lung base MSK: 2+ LE edema Neurological: alert & oriented  Skin: warm and dry Psych: pleasant mood  Filed Weights   02/06/22 1801 02/07/22 2317 02/08/22 0513  Weight: 59.9 kg 63.5 kg 63.5 kg     Intake/Output Summary (Last 24 hours) at 02/08/2022 0914 Last data filed at 02/07/2022 2348 Gross per 24 hour  Intake 240 ml  Output --  Net 240 ml   Net IO Since Admission: 240 mL [02/08/22 0914]  Pertinent Labs:    Latest Ref Rng & Units 02/08/2022   12:54 AM 02/06/2022    6:46 PM 10/15/2021    5:20 AM  CBC  WBC 4.0 - 10.5 K/uL 7.2  8.6  7.2   Hemoglobin 12.0 - 15.0 g/dL 7.3  8.8  9.6   Hematocrit 36.0 - 46.0 % 21.2  27.4  30.0   Platelets 150 - 400 K/uL 199  238  272        Latest Ref Rng & Units 02/08/2022   12:54 AM 02/07/2022    6:46 PM 02/06/2022    6:46 PM  CMP   Glucose 70 - 99 mg/dL 105  211  98   BUN 8 - 23 mg/dL 62  56  58   Creatinine 0.44 - 1.00 mg/dL 5.44  4.94  4.79   Sodium 135 - 145 mmol/L 140  140  143   Potassium 3.5 - 5.1 mmol/L 3.6  3.5  4.2   Chloride 98 - 111 mmol/L 106  108  111   CO2 22 - 32 mmol/L 22  22  23    Calcium 8.9 - 10.3 mg/dL 8.3  8.0  9.0   Total Protein 6.5 - 8.1 g/dL  6.2    Total Bilirubin 0.3 - 1.2 mg/dL  0.8    Alkaline Phos 38 - 126 U/L  64    AST 15 - 41 U/L  19    ALT 0 - 44 U/L  22      Imaging: ECHOCARDIOGRAM COMPLETE  Result Date: 02/07/2022    ECHOCARDIOGRAM REPORT   Patient Name:   [name] Date of Exam: 02/07/2022 Medical Rec #:  [id number]         Height:       62.0  in Accession #:    [id number]        Weight:       132.0 lb Date of Birth:  04-03-1960         BSA:          1.602 m Patient Age:    4 years          BP:           177/83 mmHg Patient Gender: F                 HR:           70 bpm. Exam Location:  Inpatient Procedure: 2D Echo, Cardiac Doppler and Color Doppler Indications:    Elevated troponin  History:        Patient has no prior history of Echocardiogram examinations.                 Risk Factors:Hypertension and Dyslipidemia. CKD. Orthopnea.  Sonographer:    Clayton Lefort RDCS (AE) Referring Phys: Ava  1. Left ventricular ejection fraction, by estimation, is 55 to 60%. The left ventricle has normal function. The left ventricle has no regional wall motion abnormalities. There is mild asymmetric left ventricular hypertrophy of the septal segment. Left ventricular diastolic parameters are indeterminate.  2. Right ventricular systolic function is normal. The right ventricular size is normal. There is moderately elevated pulmonary artery systolic pressure.  3. Left atrial size was mildly dilated.  4. Right atrial size was mildly dilated.  5. The mitral valve is normal in structure. Mild to moderate mitral valve regurgitation. No evidence of mitral stenosis.  6. Tricuspid  valve regurgitation is moderate.  7. The aortic valve is normal in structure. Aortic valve regurgitation is not visualized. Aortic valve sclerosis is present, with no evidence of aortic valve stenosis.  8. The inferior vena cava is dilated in size with >50% respiratory variability, suggesting right atrial pressure of 8 mmHg. FINDINGS  Left Ventricle: Left ventricular ejection fraction, by estimation, is 55 to 60%. The left ventricle has normal function. The left ventricle has no regional wall motion abnormalities. The left ventricular internal cavity size was normal in size. There is  mild asymmetric left ventricular hypertrophy of the septal segment. Left ventricular diastolic parameters are indeterminate. Right Ventricle: The right ventricular size is normal. No increase in right ventricular wall thickness. Right ventricular systolic function is normal. There is moderately elevated pulmonary artery systolic pressure. The tricuspid regurgitant velocity is 2.99 m/s, and with an assumed right atrial pressure of 15 mmHg, the estimated right ventricular systolic pressure is 97.6 mmHg. Left Atrium: Left atrial size was mildly dilated. Right Atrium: Right atrial size was mildly dilated. Pericardium: There is no evidence of pericardial effusion. Presence of epicardial fat layer. Mitral Valve: The mitral valve is normal in structure. Mild to moderate mitral valve regurgitation. No evidence of mitral valve stenosis. Tricuspid Valve: The tricuspid valve is normal in structure. Tricuspid valve regurgitation is moderate . No evidence of tricuspid stenosis. Aortic Valve: The aortic valve is normal in structure. Aortic valve regurgitation is not visualized. Aortic valve sclerosis is present, with no evidence of aortic valve stenosis. Aortic valve mean gradient measures 5.0 mmHg. Aortic valve peak gradient measures 9.5 mmHg. Aortic valve area, by VTI measures 2.31 cm. Pulmonic Valve: The pulmonic valve was normal in structure.  Pulmonic valve regurgitation is not visualized. No evidence of pulmonic stenosis. Aorta: The aortic root is normal in size and structure.  Venous: The inferior vena cava is dilated in size with greater than 50% respiratory variability, suggesting right atrial pressure of 8 mmHg. IAS/Shunts: No atrial level shunt detected by color flow Doppler.  LEFT VENTRICLE PLAX 2D LVIDd:         4.20 cm   Diastology LVIDs:         2.70 cm   LV e' medial:    7.07 cm/s LV PW:         0.90 cm   LV E/e' medial:  13.9 LV IVS:        1.20 cm   LV e' lateral:   5.66 cm/s LVOT diam:     1.90 cm   LV E/e' lateral: 17.4 LV SV:         73 LV SV Index:   45 LVOT Area:     2.84 cm  RIGHT VENTRICLE             IVC RV Basal diam:  2.80 cm     IVC diam: 2.30 cm RV S prime:     11.90 cm/s TAPSE (M-mode): 2.5 cm LEFT ATRIUM           Index        RIGHT ATRIUM           Index LA diam:      4.30 cm 2.68 cm/m   RA Area:     16.10 cm LA Vol (A2C): 27.8 ml 17.35 ml/m  RA Volume:   43.90 ml  27.40 ml/m LA Vol (A4C): 59.1 ml 36.89 ml/m  AORTIC VALVE AV Area (Vmax):    2.12 cm AV Area (Vmean):   2.18 cm AV Area (VTI):     2.31 cm AV Vmax:           154.00 cm/s AV Vmean:          102.000 cm/s AV VTI:            0.314 m AV Peak Grad:      9.5 mmHg AV Mean Grad:      5.0 mmHg LVOT Vmax:         115.00 cm/s LVOT Vmean:        78.500 cm/s LVOT VTI:          0.256 m LVOT/AV VTI ratio: 0.82  AORTA Ao Root diam: 2.60 cm Ao Asc diam:  3.00 cm MITRAL VALVE               TRICUSPID VALVE MV Area (PHT): 2.68 cm    TR Peak grad:   35.8 mmHg MV Decel Time: 283 msec    TR Vmax:        299.00 cm/s MV E velocity: 98.50 cm/s MV A velocity: 55.00 cm/s  SHUNTS MV E/A ratio:  1.79        Systemic VTI:  0.26 m                            Systemic Diam: 1.90 cm Kardie Tobb DO Electronically signed by Berniece Salines DO Signature Date/Time: 02/07/2022/5:18:01 PM    Final     Assessment/Plan:   Principal Problem:   Acute CHF (congestive heart failure) (HCC) Active  Problems:   Severe hypertension   Patient Summary: BERLYNN WARSAME is a 61 y.o. with a pertinent PMH of HTN, HLD, CKD IV, bipolar disorder and schizoaffective disorder who presents with dyspnea and admitted for acute CHF  secondary to uncontrolled HTN   HFpEF 2/2 uncontrolled HTN Diuresis with improvement of her symptoms. Echo showed EF 55-60%, with moderately elevated pulmonary artery systolic pressure. Diastolic parameters were indeterminate. Moderate tricuspid regurgitation. Urine output documented about 800 ml this AM. She is not a candidate for starting SGLT2 given her poor renal function at this time.  -IV lasix 40 mg this AM -repeat BMP later today to reassess if she needs additional lasix -strict I/O's  HTN SBP improved to 140-150. Diastolic pressure in 68T with amlodipine 10 mg. Appears to be sensitive to amlodipine after restarting it. Will decrease dose of amlodipine.  -decrease amlodipine to 5 mg   CKD IV  Creatinine increased 5.44 this morning from 4.79. Has about 800 ml urine output documented. If repeat creatinine does not improve or has poor urine output, will consult nephrology while in hospital but otherwise will need outpatient nephrology f/u.  -repeat BMP.  Normocytic Anemia Hgb 7.3 from 8.8. Hx of anemia and it appears she was on iron supplement before. Iron panel from 10/2021 was normal. No signs or evidence of bleeding. In November, Hgb was around 9. Likely mixed anemia in setting of CKD and possibly iron deficiency.  -repeat CBC -repeat iron panel   Elevated troponin Second troponin 23 from 30. Patient denies chest pain. No ischemic changes on EKG. Likely elevated due to demand.   T2DM Last A1c 6.9%. Not on any medications at home.  -SSI with meals  Bipolar disorder Schizoaffective disorder  Restarted home Seroquel. QTC was prolonged 501 on admission EKG. Repeat EKG today showed QTC 482. Patient states this medication helps significantly with her mood and  sleep. Will continue Seroquel with close monitoring.  -Seroquel 50 mg qhs  Diet:  2 g Na  IVF: None,None VTE: Heparin Code: Full TOC recs: pending   Dispo: pending diuresis, medical stability and TOC assistance given currently homeless  Angelique Blonder, DO Internal Medicine Resident PGY-1 Pager: [phone] Please contact the on call pager after 5 pm and on weekends at [phone].

## 2022-02-08 NOTE — TOC Transition Note (Signed)
Discharge medications (2) are being stored in the main pharmacy on the ground floor until patient is ready for discharge.   

## 2022-02-08 NOTE — Plan of Care (Signed)
  Problem: Education: Goal: Ability to describe self-care measures that may prevent or decrease complications (Diabetes Survival Skills Education) will improve Outcome: Progressing   Problem: Coping: Goal: Ability to adjust to condition or change in health will improve Outcome: Progressing   Problem: Fluid Volume: Goal: Ability to maintain a balanced intake and output will improve Outcome: Progressing   

## 2022-02-09 ENCOUNTER — Inpatient Hospital Stay (HOSPITAL_COMMUNITY): Payer: Medicaid Other

## 2022-02-09 LAB — IRON AND TIBC
Iron: 38 ug/dL (ref 28–170)
Iron: 55 ug/dL (ref 28–170)
Saturation Ratios: 13 % (ref 10.4–31.8)
Saturation Ratios: 17 % (ref 10.4–31.8)
TIBC: 300 ug/dL (ref 250–450)
TIBC: 330 ug/dL (ref 250–450)
UIBC: 262 ug/dL
UIBC: 275 ug/dL

## 2022-02-09 LAB — MICROALBUMIN / CREATININE URINE RATIO
Creatinine, Urine: 53.4 mg/dL
Microalb Creat Ratio: 1308 mg/g creat — ABNORMAL HIGH (ref 0–29)
Microalb, Ur: 698.7 ug/mL — ABNORMAL HIGH

## 2022-02-09 LAB — CREATININE, URINE, RANDOM: Creatinine, Urine: 73 mg/dL

## 2022-02-09 LAB — BASIC METABOLIC PANEL
Anion gap: 8 (ref 5–15)
BUN: 65 mg/dL — ABNORMAL HIGH (ref 8–23)
CO2: 26 mmol/L (ref 22–32)
Calcium: 8.2 mg/dL — ABNORMAL LOW (ref 8.9–10.3)
Chloride: 105 mmol/L (ref 98–111)
Creatinine, Ser: 5.86 mg/dL — ABNORMAL HIGH (ref 0.44–1.00)
GFR, Estimated: 8 mL/min — ABNORMAL LOW (ref >60)
Glucose, Bld: 251 mg/dL — ABNORMAL HIGH (ref 70–99)
Potassium: 4.1 mmol/L (ref 3.5–5.1)
Sodium: 139 mmol/L (ref 135–145)

## 2022-02-09 LAB — FERRITIN
Ferritin: 66 ng/mL (ref 11–307)
Ferritin: 79 ng/mL (ref 11–307)

## 2022-02-09 LAB — CBC
HCT: 22.1 % — ABNORMAL LOW (ref 36.0–46.0)
Hemoglobin: 7.6 g/dL — ABNORMAL LOW (ref 12.0–15.0)
MCH: 30.6 pg (ref 26.0–34.0)
MCHC: 34.4 g/dL (ref 30.0–36.0)
MCV: 89.1 fL (ref 80.0–100.0)
Platelets: 220 10*3/uL (ref 150–400)
RBC: 2.48 MIL/uL — ABNORMAL LOW (ref 3.87–5.11)
RDW: 12.9 % (ref 11.5–15.5)
WBC: 6.1 10*3/uL (ref 4.0–10.5)
nRBC: 0 % (ref 0.0–0.2)

## 2022-02-09 LAB — PHOSPHORUS: Phosphorus: 3.6 mg/dL (ref 2.5–4.6)

## 2022-02-09 LAB — HEMOGLOBIN A1C
Hgb A1c MFr Bld: 6.7 % — ABNORMAL HIGH (ref 4.8–5.6)
Mean Plasma Glucose: 146 mg/dL

## 2022-02-09 LAB — GLUCOSE, CAPILLARY
Glucose-Capillary: 128 mg/dL — ABNORMAL HIGH (ref 70–99)
Glucose-Capillary: 209 mg/dL — ABNORMAL HIGH (ref 70–99)
Glucose-Capillary: 160 mg/dL — ABNORMAL HIGH (ref 70–99)
Glucose-Capillary: 138 mg/dL — ABNORMAL HIGH (ref 70–99)

## 2022-02-09 LAB — SODIUM, URINE, RANDOM: Sodium, Ur: 96 mmol/L

## 2022-02-09 MED ORDER — SENNA 8.6 MG PO TABS
1.0000 | ORAL_TABLET | Freq: Once | ORAL | Status: AC
  Administered 2022-02-09: 8.6 mg via ORAL
  Filled 2022-02-09: qty 1

## 2022-02-09 MED ORDER — HYDRALAZINE HCL 10 MG PO TABS
10.0000 mg | ORAL_TABLET | Freq: Three times a day (TID) | ORAL | Status: DC | PRN
  Administered 2022-02-11: 10 mg via ORAL
  Filled 2022-02-09: qty 1

## 2022-02-09 NOTE — Consult Note (Signed)
Renal Service Consult Note [facility]  Lorraine Turner 02/09/2022 Sol Blazing, MD Requesting Physician: Dr. Cain Sieve  Reason for Consult: Renal failure HPI: The patient is a 61 y.o. year-old w/ PMH as below who presented 12/27 w/ c/o leg swelling, cough and SOB x 3 days. Pt homeless.  In ED BP's were high 200/100. Pt admitted and rec'd IV lasix and hydralazine. SOB has improved, diuresed a bit. Legs are less swollen. Creat 4s on admit now in the 5s, we are asked to see for renal failure.   All over feels much better. No n/v, no loss of appetite or energy. Does not have a kidney doctor. Has been homeless off and on for a few years. Her mom was on dialysis in [place] for about 15 yrs before she passed. Pt is diabetic on insulin for around 5-10 years. No nsaid abuse. Remains homeless.   ROS - denies CP, no joint pain, no HA, no blurry vision, no rash, no diarrhea, no nausea/ vomiting, no dysuria, no difficulty voiding   Past Medical History  Past Medical History:  Diagnosis Date   AKI (acute kidney injury) ([place]) 09/12/2018   Anxiety    Bipolar 1 disorder ([place])    Depression    Diabetes mellitus without complication ([place])    Diarrhea 11/21/2020   Gallstones    Hyperlipidemia    Hypertension    MDD (major depressive disorder), recurrent episode, moderate ([place]) 11/18/2020   Neuropathy    Schizophrenia ([place])    Seizures (HCC)    X1- febrile seizure as a child-none since   Suicide ideation 09/12/2018   Past Surgical History  Past Surgical History:  Procedure Laterality Date   CESAREAN SECTION  05/1998   X 1   CHOLECYSTECTOMY N/A 03/27/2016   Procedure: LAPAROSCOPIC CHOLECYSTECTOMY;  Surgeon: Olean Ree, MD;  Location: [facility] ORS;  Service: General;  Laterality: N/A;   TONSILLECTOMY AND ADENOIDECTOMY     age 13   TUBAL LIGATION     Family History  Family History  Problem Relation Age of Onset   Diabetes Mother    Heart disease Mother    Kidney disease Mother     Stroke Mother    Hyperlipidemia Mother    Diabetes Maternal Aunt    Social History  reports that she quit smoking about 8 years ago. Her smoking use included cigarettes. She has never used smokeless tobacco. She reports that she does not currently use alcohol after a past usage of about 1.0 standard drink of alcohol per week. She reports that she does not use drugs. Allergies  Allergies  Allergen Reactions   Lisinopril Other (See Comments)    Patient does not wish to take this medication (she heard that it can make your kidneys worse)   Amoxicillin Rash   Home medications Prior to Admission medications   Medication Sig Start Date End Date Taking? Authorizing Provider  amLODipine (NORVASC) 10 MG tablet Take 1 tablet (10 mg total) by mouth daily. 10/17/21  Yes Lacinda Axon, MD  fluticasone (FLONASE) 50 MCG/ACT nasal spray Place 1 spray into both nostrils daily. 10/16/21 10/16/22 Yes Lacinda Axon, MD  amLODipine (NORVASC) 5 MG tablet Take 1 tablet (5 mg total) by mouth daily. 02/09/22   Angelique Blonder, DO  QUEtiapine (SEROQUEL) 50 MG tablet Take 1 tablet (50 mg total) by mouth at bedtime. 02/08/22   Angelique Blonder, DO     Vitals:   02/08/22 2041 02/09/22 9323 02/09/22 0900 02/09/22 1122  BP: (!) 167/64 (!) 162/64 (!) 176/67 (!) 165/57  Pulse: 67 63 65 66  Resp: _0 Temp: 97.9 F (36.6 C) 98.5 F (36.9 C)  98.9 F (37.2 C)  TempSrc: Oral Oral  Oral  SpO2: 93% 94% 91% 93%  Weight:  62 kg    Height:       Exam Gen alert, no distress No rash, cyanosis or gangrene Sclera anicteric, throat clear  No jvd or bruits Chest clear bilat to bases, no rales/ wheezing RRR no MRG Abd soft ntnd no mass or ascites +bs GU defer MS no joint effusions or deformity Ext no LE or UE edema, no wounds or ulcers Neuro is alert, Ox 3 , nf    Home meds include - seroquel 50 hx, norvasc 10     Date   Creat  eGFR    2019   1.00- 1.77    2020   0.79- 1.58    2021   1.79- 2.35     Feb- sept 2022 2.60- 3.64    Oct 2022  3.28- 4.33    Nov 2022  3.32- 3.85    Sept 2023  5.13 >> 4.80 9-10 ml/min     12/27  4.79    12/28  4.94     12/29  5.44     12/30  5.86     UA 100 prot, 0-5 rbc/ wbc    Alb 3.0    CXR 12/27 - IMPRESSION: Bibasilar consolidation and effusions, right greater than left. Stable enlarged cardiac silhouette.      RA 91- 94 %, I/O since admit 1000 in and 1600 UOP     SP IV lasix 40 mg IV x 3 since admit     UNa, UCr pend     Renal US pend   Assessment/ Plan: AKI on CKD 5 - b/l creat 4.8- 5.1 from Sept 2023, eGFR 9-10 ml/min. Creat here was 4.7 on admit in setting of SOB and vol overload w/ pleural effusions on CXR and new LE edema. Pt was diuresed w/ good UOP and improvement in SOB and leg swelling. Creat has increased up to 5.8 today. Lasix was stopped on 12/29. UA unremarkable, renal US pending. Suspect CKD is due to DM/ HTN. Suspect AKI here is related to IV diuretics, would cont to hold for now as pt looks euvolemic on exam today. Pt has no concerning uremic symptoms. Pt will need to try to f/u at [facility] after discharge, but her homelessness may make this a challenge. Will get upper ext vein mapping, consider VVS consult. Will follow.  DM2- per pmd Volume - looks euvolemic now Anemia esrd - Hb 7.3, will get fe/ tibc, consider IV Fe MBD ckd - CCa in range, add on phos      Rob Daysy Santini  MD 02/09/2022, 5:23 PM Recent Labs  Lab 02/07/22 1846 02/08/22 0054 02/08/22 1133 02/08/22 1511 02/09/22 0033  HGB  --  7.3*  --   --  7.6*  ALBUMIN 3.0*  --   --   --   --   CALCIUM 8.0* 8.3*   < > 8.0* 8.2*  CREATININE 4.94* 5.44*   < > 5.78* 5.86*  K 3.5 3.6   < > 3.6 4.1   < > = values in this interval not displayed.   Inpatient medications:  amLODipine  5 mg Oral Daily   heparin  5,000 Units Subcutaneous Q8H   insulin aspart  0-9 Units  Subcutaneous TID WC   QUEtiapine  50 mg Oral QHS    acetaminophen **OR** acetaminophen, albuterol, hydrALAZINE,  ondansetron **OR** ondansetron (ZOFRAN) IV

## 2022-02-09 NOTE — Progress Notes (Signed)
HD#1 Subjective:   Summary: Lorraine Turner is a 61 y.o. female with PMH of HTN, HLD, CKD IV, bipolar disorder and schizoaffective disorder who presents with dyspnea and admitted for acute CHF secondary to uncontrolled HTN.  Overnight Events: none  Patient feeling better with her breathing. No new concerns. States she has been voiding frequently.    Objective:  Vital signs in last 24 hours: Vitals:   02/08/22 1127 02/08/22 1556 02/08/22 2041 02/09/22 0519  BP: (!) 147/51 (!) 153/55 (!) 167/64 (!) 162/64  Pulse: 78 64 67 63  Resp: 16 18 18 18   Temp: 99 F (37.2 C) 98.6 F (37 C) 97.9 F (36.6 C) 98.5 F (36.9 C)  TempSrc: Oral Oral Oral Oral  SpO2: 93% 94% 93% 94%  Weight:    62 kg  Height:       Supplemental O2: Room Air SpO2: 94 %   Physical Exam:  Constitutional: resting in bed comfortably, in no acute distress HENT: normocephalic atraumatic Cardiovascular: regular rate and rhythm, 1-2+ LE edema  Pulmonary/Chest: non-labored breathing on room air, mild rales over right lung base MSK: 1-2+ LE edema Neurological: alert & oriented  Skin: warm and dry Psych: pleasant mood  Filed Weights   02/07/22 2317 02/08/22 0513 02/09/22 0519  Weight: 63.5 kg 63.5 kg 62 kg     Intake/Output Summary (Last 24 hours) at 02/09/2022 0657 Last data filed at 02/09/2022 0400 Gross per 24 hour  Intake 427 ml  Output 1000 ml  Net -573 ml    Net IO Since Admission: -333 mL [02/09/22 0657]  Pertinent Labs:    Latest Ref Rng & Units 02/09/2022   12:33 AM 02/08/2022   12:54 AM 02/06/2022    6:46 PM  CBC  WBC 4.0 - 10.5 K/uL 6.1  7.2  8.6   Hemoglobin 12.0 - 15.0 g/dL 7.6  7.3  8.8   Hematocrit 36.0 - 46.0 % 22.1  21.2  27.4   Platelets 150 - 400 K/uL 220  199  238        Latest Ref Rng & Units 02/09/2022   12:33 AM 02/08/2022    3:11 PM 02/08/2022   11:33 AM  CMP  Glucose 70 - 99 mg/dL 251  203  79   BUN 8 - 23 mg/dL 65  64  62   Creatinine 0.44 - 1.00 mg/dL  5.86  5.78  5.53   Sodium 135 - 145 mmol/L 139  139  142   Potassium 3.5 - 5.1 mmol/L 4.1  3.6  3.2   Chloride 98 - 111 mmol/L 105  105  108   CO2 22 - 32 mmol/L 26  23  25    Calcium 8.9 - 10.3 mg/dL 8.2  8.0  8.2     Imaging: No results found.  Assessment/Plan:   Principal Problem:   Acute CHF (congestive heart failure) (HCC) Active Problems:   Severe hypertension   AKI (acute kidney injury) ([place])   Acute heart failure [facility])   Patient Summary: Lorraine Turner is a 61 y.o. with a pertinent PMH of HTN, HLD, CKD IV, bipolar disorder and schizoaffective disorder who presents with dyspnea and admitted for acute CHF secondary to uncontrolled HTN  Acute exacerbation of HFpEF 2/2 uncontrolled HTN Respiratory symptoms improved with diuresis. Documented 1L output yesterday. She is not a candidate for starting SGLT2 given her poor renal function at this time. Creatinine continues to increase so will hold off with diuresis for now.  -  strict I/O's -amlodipine  HTN SBP in 160s this morning. Diastolic pressure in 18-36D. Decreased her dose of amlodipine yesterday.  -amlodipine 5 mg    AKI on CKD IV  Creatinine increased 5.86 this morning from admission of 4.79. 1L urine output yesterday. Consulted nephrology today.  -f/u nephrology consult -repeat BMP  Anemia in setting of CKD Hgb steady around 7.6. Iron panel was WNL. Likely mixed anemia in setting of CKD and possibly iron deficiency.   T2DM Last A1c 6.9%. Not on any medications at home.  -SSI with meals  Bipolar disorder Schizoaffective disorder  Restarted home Seroquel. Repeat EKG showed QTC 482. Patient states this medication helps significantly with her mood and sleep. Will continue Seroquel with close monitoring.  -Seroquel 50 mg qhs  Diet:  2 g Na  IVF: None,None VTE: Heparin Code: Full TOC recs: pending   Dispo: pending diuresis, medical stability and TOC assistance given currently homeless  Angelique Blonder,  DO Internal Medicine Resident PGY-1 Pager: [phone] Please contact the on call pager after 5 pm and on weekends at [phone].

## 2022-02-10 DIAGNOSIS — N179 Acute kidney failure, unspecified: Secondary | ICD-10-CM | POA: Diagnosis not present

## 2022-02-10 DIAGNOSIS — I5031 Acute diastolic (congestive) heart failure: Secondary | ICD-10-CM | POA: Diagnosis not present

## 2022-02-10 DIAGNOSIS — I130 Hypertensive heart and chronic kidney disease with heart failure and stage 1 through stage 4 chronic kidney disease, or unspecified chronic kidney disease: Secondary | ICD-10-CM | POA: Diagnosis not present

## 2022-02-10 DIAGNOSIS — N184 Chronic kidney disease, stage 4 (severe): Secondary | ICD-10-CM | POA: Diagnosis not present

## 2022-02-10 LAB — GLUCOSE, CAPILLARY
Glucose-Capillary: 118 mg/dL — ABNORMAL HIGH (ref 70–99)
Glucose-Capillary: 150 mg/dL — ABNORMAL HIGH (ref 70–99)
Glucose-Capillary: 146 mg/dL — ABNORMAL HIGH (ref 70–99)
Glucose-Capillary: 128 mg/dL — ABNORMAL HIGH (ref 70–99)

## 2022-02-10 LAB — RENAL FUNCTION PANEL
Albumin: 2.9 g/dL — ABNORMAL LOW (ref 3.5–5.0)
Anion gap: 11 (ref 5–15)
BUN: 59 mg/dL — ABNORMAL HIGH (ref 8–23)
CO2: 22 mmol/L (ref 22–32)
Calcium: 8.4 mg/dL — ABNORMAL LOW (ref 8.9–10.3)
Chloride: 106 mmol/L (ref 98–111)
Creatinine, Ser: 5.63 mg/dL — ABNORMAL HIGH (ref 0.44–1.00)
GFR, Estimated: 8 mL/min — ABNORMAL LOW (ref >60)
Glucose, Bld: 162 mg/dL — ABNORMAL HIGH (ref 70–99)
Phosphorus: 3.8 mg/dL (ref 2.5–4.6)
Potassium: 4.5 mmol/L (ref 3.5–5.1)
Sodium: 139 mmol/L (ref 135–145)

## 2022-02-10 MED ORDER — SODIUM CHLORIDE 0.9 % IV SOLN
250.0000 mg | Freq: Every day | INTRAVENOUS | Status: AC
  Administered 2022-02-10 – 2022-02-11 (×2): 250 mg via INTRAVENOUS
  Filled 2022-02-10 (×2): qty 20

## 2022-02-10 NOTE — Progress Notes (Addendum)
[facility] Progress Note  Subjective: seen in room, UOP good 800 cc yest and 1200 so far today. Creat 5.6 today, down from 5.8.  Vitals:   02/09/22 1122 02/09/22 1955 02/10/22 0411 02/10/22 1039  BP: (!) 165/57 (!) 177/65 (!) 172/61 (!) 208/77  Pulse: 66 70 63 77  Resp: _0 Temp: 98.9 F (37.2 C) 98.7 F (37.1 C) 98.4 F (36.9 C) 98.5 F (36.9 C)  TempSrc: Oral Oral Oral Oral  SpO2: 93% 92% 92% 94%  Weight:   61.8 kg   Height:        Exam: Gen alert, no distress No jvd or bruits Chest clear bilat to bases RRR no MRG Abd soft ntnd no mass or ascites +bs Ext no LE edema Neuro is alert, Ox 3 , nf      Home meds include - seroquel 50 hx, norvasc 10      Date                          Creat               eGFR    2019                         1.00- 1.77    2020                         0.79- 1.58    2021                         1.79- 2.35    Feb- sept 2022         2.60- 3.64    Oct 2022                  3.28- 4.33    Nov 2022                  3.32- 3.85    Sept 2023                 5.13 >> 4.80    9-10 ml/min     12/27                        4.79    12/28                        4.94     12/29                       5.44     12/30                       5.86     12/31   5.63        UA 100 prot, 0-5 rbc/ wbc    Alb 3.0    CXR 12/27 - IMPRESSION: Bibasilar consolidation and effusions, right greater than left. Stable enlarged cardiac silhouette.      RA 91- 94 %, I/O since admit 1000 in and 1600 UOP     SP IV lasix 40 mg IV x 3 since admit     UNa, UCr pend     Renal US - normal size kidneys w/o hydro, Edison Pace bilat  Assessment/ Plan: AKI on CKD 5 - b/l creat 4.8- 5.1 from Sept 2023, eGFR 9-10 ml/min. Creat here was 4.7 on admit in setting of SOB and vol overload w/ pleural effusions on CXR and new LE edema. Pt was diuresed w/ good UOP and improvement in SOB and leg swelling. Creat increased up to 5.8. Lasix had been stopped 12/29. Work-up shows UA  is unremarkable, renal US shows ^'d echo w/o obstruction. Suspect advanced CKD due to DM/ HTN. Suspect AKI here is related to IV diuretics which was necessary therapy. Creat peaked at 5.8 yest and is down to 5.6 today. Pt has no concerning uremic symptoms and vol overload has resolved. Pt has CKD 5, does not have a nephrologist (until now) and is homeless. Would keep her in hospital for now, watch renal function and ask [facility]VS to see her after the holidays for permanent access placement later this week.  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 Pharmacy consulted and recommends ferric gluconate 250 mg IV qd x 2.  MBD ckd - CCa in range, phos 3s. No binder needs for now.    Lorraine Turner 02/10/2022, 12:25 PM   Recent Labs  Lab 02/07/22 1846 02/08/22 0054 02/08/22 1133 02/09/22 0033 02/09/22 1830 02/10/22 0031  HGB  --  7.3*  --  7.6*  --   --   ALBUMIN 3.0*  --   --   --   --  2.9*  CALCIUM 8.0* 8.3*   < > 8.2*  --  8.4*  PHOS  --   --   --   --  3.6 3.8  CREATININE 4.94* 5.44*   < > 5.86*  --  5.63*  K 3.5 3.6   < > 4.1  --  4.5   < > = values in this interval not displayed.   Recent Labs  Lab 02/09/22 0033 02/09/22 1830  IRON 38 55  TIBC 300 330  FERRITIN 66 79   Inpatient medications:  amLODipine  5 mg Oral Daily   heparin  5,000 Units Subcutaneous Q8H   insulin aspart  0-9 Units Subcutaneous TID WC   QUEtiapine  50 mg Oral QHS    acetaminophen **OR** acetaminophen, albuterol, hydrALAZINE, ondansetron **OR** ondansetron (ZOFRAN) IV

## 2022-02-10 NOTE — Progress Notes (Signed)
[facility] for IV iron Indication: CKD  Allergies  Allergen Reactions   Lisinopril Other (See Comments)    Patient does not wish to take this medication (she heard that it can make your kidneys worse)   Amoxicillin Rash    Patient Measurements: Height: 5\' 2"  (157.5 cm) Weight: 61.8 kg (136 lb 3.9 oz) IBW/kg (Calculated) : 50.1   Estimated Creatinine Clearance: 9.1 mL/min (A) (by C-G formula based on SCr of 5.63 mg/dL (H)).  Medical History: Past Medical History:  Diagnosis Date   AKI (acute kidney injury) ([place]) 09/12/2018   Anxiety    Bipolar 1 disorder ([place])    Depression    Diabetes mellitus without complication ([place])    Diarrhea 11/21/2020   Gallstones    Hyperlipidemia    Hypertension    MDD (major depressive disorder), recurrent episode, moderate ([place]) 11/18/2020   Neuropathy    Schizophrenia ([place])    Seizures (HCC)    X1- febrile seizure as a child-none since   Suicide ideation 09/12/2018    Assessment: 60 yof with AKI on CKD IV who presented to ED with dyspnea secondary to HF exacerbation. Pharmacy consulted to replete iron stores with IV iron.  Anemia panel shows low TSat 17%, low ferritin 79, iron 55. Hgb stable 7-8s.  Plan:  Ferric gluconate 250 mg IV daily for 2 doses Consider further dosing if patient remains inpatient  Thank you for involving pharmacy in this patient's care.  Renold Genta, PharmD, BCPS Clinical Pharmacist Clinical phone for 02/10/2022 is [phone] 02/10/2022 5:42 PM

## 2022-02-10 NOTE — Progress Notes (Addendum)
HD#2 Subjective:   Summary: Lorraine Turner is a 61 y.o. female with PMH of HTN, HLD, CKD IV, bipolar disorder and schizoaffective disorder who presents with dyspnea and admitted for acute CHF secondary to uncontrolled HTN.  Overnight Events: none  Patient reports feeling well. No dyspnea with little cough. Having better appetite and able to eat more of her breakfast today. She was tearful today after the discussion with nephrology about her kidney disease yesterday.    Objective:  Vital signs in last 24 hours: Vitals:   02/09/22 0900 02/09/22 1122 02/09/22 1955 02/10/22 0411  BP: (!) 176/67 (!) 165/57 (!) 177/65 (!) 172/61  Pulse: 65 66 70 63  Resp: 16 17 18 18   Temp:  98.9 F (37.2 C) 98.7 F (37.1 C) 98.4 F (36.9 C)  TempSrc:  Oral Oral Oral  SpO2: 91% 93% 92% 92%  Weight:    61.8 kg  Height:       Supplemental O2: Room Air SpO2: 92 %   Physical Exam:  Constitutional: resting in bed comfortably, in no acute distress HENT: normocephalic atraumatic Cardiovascular: regular rate and rhythm, trace to 1+ LE edema  Pulmonary/Chest: non-labored breathing on room air, lungs clear to auscultation Neurological: alert & oriented  Skin: warm and dry Psych: tearful mood  Filed Weights   02/08/22 0513 02/09/22 0519 02/10/22 0411  Weight: 63.5 kg 62 kg 61.8 kg     Intake/Output Summary (Last 24 hours) at 02/10/2022 0626 Last data filed at 02/10/2022 0414 Gross per 24 hour  Intake 550 ml  Output 1200 ml  Net -650 ml    Net IO Since Admission: -983 mL [02/10/22 0626]  Pertinent Labs:    Latest Ref Rng & Units 02/09/2022   12:33 AM 02/08/2022   12:54 AM 02/06/2022    6:46 PM  CBC  WBC 4.0 - 10.5 K/uL 6.1  7.2  8.6   Hemoglobin 12.0 - 15.0 g/dL 7.6  7.3  8.8   Hematocrit 36.0 - 46.0 % 22.1  21.2  27.4   Platelets 150 - 400 K/uL 220  199  238        Latest Ref Rng & Units 02/10/2022   12:31 AM 02/09/2022   12:33 AM 02/08/2022    3:11 PM  CMP  Glucose  70 - 99 mg/dL 162  251  203   BUN 8 - 23 mg/dL 59  65  64   Creatinine 0.44 - 1.00 mg/dL 5.63  5.86  5.78   Sodium 135 - 145 mmol/L 139  139  139   Potassium 3.5 - 5.1 mmol/L 4.5  4.1  3.6   Chloride 98 - 111 mmol/L 106  105  105   CO2 22 - 32 mmol/L 22  26  23    Calcium 8.9 - 10.3 mg/dL 8.4  8.2  8.0     Imaging: US RENAL  Result Date: 02/09/2022 CLINICAL DATA:  Renal dysfunction EXAM: RENAL / URINARY TRACT ULTRASOUND COMPLETE COMPARISON:  10/15/2021 FINDINGS: Right Kidney: Renal measurements: 9.4 x 3.89 x 4.86 cm = volume: 93.05 mL. There is no hydronephrosis. There is increased cortical echogenicity. There is 5 mm hyperechoic focus in the midportion without definite acoustic shadowing. Left Kidney: Renal measurements: 9.91 x 5.14 x 4 cm = volume: 106.7 mL. There is no hydronephrosis. There is increased cortical echogenicity. Bladder: Appears normal for degree of bladder distention. Other: Small bilateral pleural effusions are seen. IMPRESSION: There is no hydronephrosis. Increased cortical echogenicity suggest medical  renal disease. There is 5 mm hyperechoic focus in the midportion of right kidney suggesting possible nonobstructing calculus or partial volume averaging artifact due to renal sinus fat. Electronically Signed   By: Elmer Picker M.D.   On: 02/09/2022 21:54    Assessment/Plan:   Principal Problem:   Acute CHF (congestive heart failure) (HCC) Active Problems:   Severe hypertension   AKI (acute kidney injury) ([place])   Acute heart failure ([place])   Patient Summary: Lorraine Turner is a 61 y.o. with a pertinent PMH of HTN, HLD, CKD IV, bipolar disorder and schizoaffective disorder who presents with dyspnea and admitted for acute CHF secondary to uncontrolled HTN  Acute exacerbation of HFpEF 2/2 uncontrolled HTN Respiratory symptoms improved. Documented 1.5L output yesterday and net negative. We have held off diuresis given worsening creatinine. Volume status has improved  so will hold off on further diuresis.  -strict I/O's -amlodipine  HTN SBP remains elevated this morning. Diastolic pressure in 19J. Will continue with amlodipine and PRN hydralazine. -amlodipine 5 mg   -PRN hydralazine if SBP >180, hold it if DBP <60  AKI on CKD IV  Creatinine trending down 5.63 from 5.86 after holding diuresis. Admission was 4.79. Still has good urine output. Renal US shows previously seen medical kidney disease. Pending vein mapping, consider [facility]VS consult.  -appreciate nephrology assistance -f/u vein mapping, will then consider [facility] consult -Renal function panel  Anemia in setting of CKD Hgb steady around 7.6. Iron panel was WNL. Likely mixed anemia in setting of CKD and possibly iron deficiency.  -CBC  T2DM Last A1c 6.9%. Not on any medications at home. CBG within goal. -SSI with meals  Bipolar disorder Schizoaffective disorder  Restarted home Seroquel. Repeat EKG showed QTC 482. Patient states this medication helps significantly with her mood and sleep. Will continue Seroquel with close monitoring. Denies any SI now, but was tearful after her discussion with nephrology regarding possible dialysis. Offered support today, she states she has a strong faith and would like to discuss this with the chaplain.  -Seroquel 50 mg qhs -consult to spiritual care  TOC Patient is currently unhomed and living in a friend's car. No other stable housing. SW will provide shelter list/resources. Amlodipine and seroquel refills ready at [place].   Diet:  2 g Na  IVF: None,None VTE: Heparin Code: Full TOC recs: will provide shelter list   Dispo: pending creatinine/kidney function and upper vein mapping   Angelique Blonder, DO Internal Medicine Resident PGY-1 Pager: [phone] Please contact the on call pager after 5 pm and on weekends at [phone].

## 2022-02-10 NOTE — Plan of Care (Signed)
Continuous tele d/c'd. Pt walked in hallways x3. Pt given IV iron.  Problem: Education: Goal: Ability to describe self-care measures that may prevent or decrease complications (Diabetes Survival Skills Education) will improve Outcome: Progressing Goal: Individualized Educational Video(s) Outcome: Progressing   Problem: Coping: Goal: Ability to adjust to condition or change in health will improve Outcome: Progressing   Problem: Fluid Volume: Goal: Ability to maintain a balanced intake and output will improve Outcome: Progressing   Problem: Health Behavior/Discharge Planning: Goal: Ability to identify and utilize available resources and services will improve Outcome: Progressing Goal: Ability to manage health-related needs will improve Outcome: Progressing   Problem: Metabolic: Goal: Ability to maintain appropriate glucose levels will improve Outcome: Progressing   Problem: Nutritional: Goal: Maintenance of adequate nutrition will improve Outcome: Progressing Goal: Progress toward achieving an optimal weight will improve Outcome: Progressing   Problem: Skin Integrity: Goal: Risk for impaired skin integrity will decrease Outcome: Progressing   Problem: Tissue Perfusion: Goal: Adequacy of tissue perfusion will improve Outcome: Progressing   Problem: Education: Goal: Knowledge of General Education information will improve Description: Including pain rating scale, medication(s)/side effects and non-pharmacologic comfort measures Outcome: Progressing   Problem: Health Behavior/Discharge Planning: Goal: Ability to manage health-related needs will improve Outcome: Progressing   Problem: Clinical Measurements: Goal: Ability to maintain clinical measurements within normal limits will improve Outcome: Progressing Goal: Will remain free from infection Outcome: Progressing Goal: Diagnostic test results will improve Outcome: Progressing Goal: Respiratory complications will  improve Outcome: Progressing Goal: Cardiovascular complication will be avoided Outcome: Progressing   Problem: Activity: Goal: Risk for activity intolerance will decrease Outcome: Progressing   Problem: Nutrition: Goal: Adequate nutrition will be maintained Outcome: Progressing   Problem: Coping: Goal: Level of anxiety will decrease Outcome: Progressing   Problem: Elimination: Goal: Will not experience complications related to bowel motility Outcome: Progressing Goal: Will not experience complications related to urinary retention Outcome: Progressing   Problem: Pain Managment: Goal: General experience of comfort will improve Outcome: Progressing   Problem: Safety: Goal: Ability to remain free from injury will improve Outcome: Progressing   Problem: Skin Integrity: Goal: Risk for impaired skin integrity will decrease Outcome: Progressing   Problem: Education: Goal: Knowledge of General Education information will improve Description: Including pain rating scale, medication(s)/side effects and non-pharmacologic comfort measures Outcome: Progressing   Problem: Health Behavior/Discharge Planning: Goal: Ability to manage health-related needs will improve Outcome: Progressing   Problem: Clinical Measurements: Goal: Ability to maintain clinical measurements within normal limits will improve Outcome: Progressing Goal: Will remain free from infection Outcome: Progressing Goal: Diagnostic test results will improve Outcome: Progressing Goal: Respiratory complications will improve Outcome: Progressing Goal: Cardiovascular complication will be avoided Outcome: Progressing   Problem: Coping: Goal: Level of anxiety will decrease Outcome: Progressing   Problem: Safety: Goal: Ability to remain free from injury will improve Outcome: Progressing

## 2022-02-10 NOTE — Social Work (Signed)
CSW notes patient is homeless, she has been living in her car for 10 months, and unfortunately at this time there is significant barriers to securing housing in the medical setting.   CSW has provided patient with information on resources to follow-up with in the community once they discharge. CSW notes patient arrived to the hospital with no current residence and when she is medically cleared she is appropriate to discharge back to their current living situation.   Emeterio Reeve, LCSW Clinical Social Worker

## 2022-02-11 DIAGNOSIS — I130 Hypertensive heart and chronic kidney disease with heart failure and stage 1 through stage 4 chronic kidney disease, or unspecified chronic kidney disease: Secondary | ICD-10-CM | POA: Diagnosis not present

## 2022-02-11 DIAGNOSIS — I5030 Unspecified diastolic (congestive) heart failure: Secondary | ICD-10-CM

## 2022-02-11 DIAGNOSIS — Z87891 Personal history of nicotine dependence: Secondary | ICD-10-CM | POA: Diagnosis not present

## 2022-02-11 DIAGNOSIS — N184 Chronic kidney disease, stage 4 (severe): Secondary | ICD-10-CM | POA: Diagnosis not present

## 2022-02-11 LAB — CBC
HCT: 24.2 % — ABNORMAL LOW (ref 36.0–46.0)
Hemoglobin: 8 g/dL — ABNORMAL LOW (ref 12.0–15.0)
MCH: 30.4 pg (ref 26.0–34.0)
MCHC: 33.1 g/dL (ref 30.0–36.0)
MCV: 92 fL (ref 80.0–100.0)
Platelets: 237 10*3/uL (ref 150–400)
RBC: 2.63 MIL/uL — ABNORMAL LOW (ref 3.87–5.11)
RDW: 13 % (ref 11.5–15.5)
WBC: 6.3 10*3/uL (ref 4.0–10.5)
nRBC: 0 % (ref 0.0–0.2)

## 2022-02-11 LAB — GLUCOSE, CAPILLARY
Glucose-Capillary: 99 mg/dL (ref 70–99)
Glucose-Capillary: 143 mg/dL — ABNORMAL HIGH (ref 70–99)
Glucose-Capillary: 167 mg/dL — ABNORMAL HIGH (ref 70–99)
Glucose-Capillary: 189 mg/dL — ABNORMAL HIGH (ref 70–99)

## 2022-02-11 MED ORDER — AMLODIPINE BESYLATE 10 MG PO TABS
10.0000 mg | ORAL_TABLET | Freq: Every day | ORAL | Status: DC
  Administered 2022-02-12 – 2022-02-13 (×2): 10 mg via ORAL
  Filled 2022-02-11 (×2): qty 1

## 2022-02-11 MED ORDER — AMLODIPINE BESYLATE 5 MG PO TABS
5.0000 mg | ORAL_TABLET | Freq: Once | ORAL | Status: AC
  Administered 2022-02-11: 5 mg via ORAL
  Filled 2022-02-11: qty 1

## 2022-02-11 NOTE — Plan of Care (Signed)
  Problem: Education: Goal: Ability to describe self-care measures that may prevent or decrease complications (Diabetes Survival Skills Education) will improve Outcome: Progressing Goal: Individualized Educational Video(s) Outcome: Progressing   Problem: Coping: Goal: Ability to adjust to condition or change in health will improve Outcome: Progressing   Problem: Fluid Volume: Goal: Ability to maintain a balanced intake and output will improve Outcome: Progressing   Problem: Health Behavior/Discharge Planning: Goal: Ability to identify and utilize available resources and services will improve Outcome: Progressing Goal: Ability to manage health-related needs will improve Outcome: Progressing   Problem: Metabolic: Goal: Ability to maintain appropriate glucose levels will improve Outcome: Progressing   Problem: Nutritional: Goal: Maintenance of adequate nutrition will improve Outcome: Progressing Goal: Progress toward achieving an optimal weight will improve Outcome: Progressing   Problem: Skin Integrity: Goal: Risk for impaired skin integrity will decrease Outcome: Progressing   Problem: Tissue Perfusion: Goal: Adequacy of tissue perfusion will improve Outcome: Progressing   Problem: Education: Goal: Knowledge of General Education information will improve Description: Including pain rating scale, medication(s)/side effects and non-pharmacologic comfort measures Outcome: Progressing   Problem: Health Behavior/Discharge Planning: Goal: Ability to manage health-related needs will improve Outcome: Progressing   Problem: Clinical Measurements: Goal: Ability to maintain clinical measurements within normal limits will improve Outcome: Progressing Goal: Will remain free from infection Outcome: Progressing Goal: Diagnostic test results will improve Outcome: Progressing Goal: Respiratory complications will improve Outcome: Progressing Goal: Cardiovascular complication will  be avoided Outcome: Progressing   Problem: Activity: Goal: Risk for activity intolerance will decrease Outcome: Progressing   Problem: Nutrition: Goal: Adequate nutrition will be maintained Outcome: Progressing   Problem: Coping: Goal: Level of anxiety will decrease Outcome: Progressing   Problem: Elimination: Goal: Will not experience complications related to bowel motility Outcome: Progressing Goal: Will not experience complications related to urinary retention Outcome: Progressing   Problem: Pain Managment: Goal: General experience of comfort will improve Outcome: Progressing   Problem: Safety: Goal: Ability to remain free from injury will improve Outcome: Progressing   Problem: Skin Integrity: Goal: Risk for impaired skin integrity will decrease Outcome: Progressing   Problem: Education: Goal: Knowledge of General Education information will improve Description: Including pain rating scale, medication(s)/side effects and non-pharmacologic comfort measures Outcome: Progressing   Problem: Health Behavior/Discharge Planning: Goal: Ability to manage health-related needs will improve Outcome: Progressing   Problem: Clinical Measurements: Goal: Ability to maintain clinical measurements within normal limits will improve Outcome: Progressing Goal: Will remain free from infection Outcome: Progressing Goal: Diagnostic test results will improve Outcome: Progressing Goal: Respiratory complications will improve Outcome: Progressing Goal: Cardiovascular complication will be avoided Outcome: Progressing   Problem: Coping: Goal: Level of anxiety will decrease Outcome: Progressing   Problem: Safety: Goal: Ability to remain free from injury will improve Outcome: Progressing

## 2022-02-11 NOTE — Progress Notes (Signed)
[facility] NEPHROLOGY PROGRESS NOTE  Assessment/ Plan:  # AKI on CKD 5 - b/l creat 4.8- 5.1 from Sept 2023, eGFR 9-10 ml/min. Creat here was 4.7 on admit in setting of SOB and vol overload w/ pleural effusions on CXR and new LE edema. Pt was diuresed w/ good UOP and improvement in SOB and leg swelling. Creat increased up to 5.8. Lasix had been stopped 12/29. Work-up shows UA is unremarkable, renal US shows ^'d echo w/o obstruction. Suspect advanced CKD due to DM/ HTN. Suspect AKI here is related to IV diuretics which was needed for vol management.  Both BUN and creatinine level downtrending.  Patient has no overt uremic symptoms therefore no urgent need for dialysis.  Patient has CKD and is on denial of heat and no nephrologist set up as outpatient.  Noted vein mapping was ordered, will consider vascular surgery consult to see if provide access can be placed while she is in the hospital.  Strict ins and out and daily lab.   # HTN/Volume - looks euvolemic, no further diuretics needed.  Increased amlodipine to 10 mg for the management of hypertension.  #Anemia due to esrd -  tsat 17%, ferritin 79.  Treating with ferric gluconate 250 mg IV qd x 2.   #MBD ckd - CCa in range, phos 3s. No binder needs for now.  Subjective: Seen and examined at bedside.  Denies nausea, vomiting, chest pain, shortness of breath.  Urine output is recorded as 2.1 L. Objective Vital signs in last 24 hours: Vitals:   02/10/22 1515 02/10/22 2021 02/11/22 0545 02/11/22 0914  BP: (!) 176/71 (!) 171/75 (!) 181/70 (!) 173/71  Pulse: 65 62 60 70  Resp: _0 Temp: 98.7 F (37.1 C) 98.8 F (37.1 C) 98.7 F (37.1 C) 99.2 F (37.3 C)  TempSrc: Oral Oral Oral Oral  SpO2: 94% 95% 95% 96%  Weight:   57.7 kg   Height:       Weight change: -4.1 kg  Intake/Output Summary (Last 24 hours) at 02/11/2022 1038 Last data filed at 02/11/2022 0546 Gross per 24 hour  Intake 720 ml  Output 2150 ml  Net -1430 ml        Labs: RENAL PANEL Recent Labs    10/13/21 2015 10/14/21 2129 10/15/21 0520 10/16/21 0451 02/06/22 1846 02/07/22 1846 02/08/22 0054 02/08/22 1133 02/08/22 1511 02/09/22 0033 02/09/22 1830 02/10/22 0031  NA _1  --  139  K 3.9 4.4 4.0 3.8 4.2 3.5 3.6 3.2* 3.6 4.1  --  4.5  CL _2  --  106  CO_3 26  --  22  GLUCOSE 159* 183* 145* 117* 98 211* 105* 79 203* 251*  --  162*  BUN 56* 64* 64* 58* 58* 56* 62* 62* 64* 65*  --  59*  CREATININE 5.13* 5.27* 5.17* 4.80* 4.79* 4.94* 5.44* 5.53* 5.78* 5.86*  --  5.63*  CALCIUM 10.1 9.3 8.9 8.5* 9.0 8.0* 8.3* 8.2* 8.0* 8.2*  --  8.4*  MG  --   --  2.5*  --   --  2.0  --   --   --   --   --   --   PHOS  --   --  4.7* 4.0  --   --   --   --   --   --  3.6 3.8  ALBUMIN 4.1 3.7 3.7 3.1*  --  3.0*  --   --   --   --   --  2.9*     Liver Function Tests: Recent Labs  Lab 02/07/22 1846 02/10/22 0031  AST 19  --   ALT 22  --   ALKPHOS 64  --   BILITOT 0.8  --   PROT 6.2*  --   ALBUMIN 3.0* 2.9*   No results for input(s): "LIPASE", "AMYLASE" in the last 168 hours. No results for input(s): "AMMONIA" in the last 168 hours. CBC: Recent Labs    10/13/21 2015 10/15/21 0520 02/06/22 1846 02/08/22 0054 02/09/22 0033 02/09/22 1830  HGB 9.7* 9.6* 8.8* 7.3* 7.6*  --   MCV 89.7 94.3 94.5 90.2 89.1  --   VITAMINB12  --  382  --   --   --   --   FOLATE  --  10.5  --   --   --   --   FERRITIN  --  123  --   --  66 79  TIBC  --  304  --   --  300 330  IRON  --  69  --   --  38 55    Cardiac Enzymes: No results for input(s): "CKTOTAL", "CKMB", "CKMBINDEX", "TROPONINI" in the last 168 hours. CBG: Recent Labs  Lab 02/10/22 0603 02/10/22 1102 02/10/22 1624 02/10/22 2215 02/11/22 0642  GLUCAP 118* 150* 146* 128* 99    Iron Studies:  Recent Labs    02/09/22 1830  IRON 55  TIBC 330  FERRITIN 79   Studies/Results: US  RENAL  Result Date: 02/09/2022 CLINICAL DATA:  Renal dysfunction EXAM: RENAL / URINARY TRACT ULTRASOUND COMPLETE COMPARISON:  10/15/2021 FINDINGS: Right Kidney: Renal measurements: 9.4 x 3.89 x 4.86 cm = volume: 93.05 mL. There is no hydronephrosis. There is increased cortical echogenicity. There is 5 mm hyperechoic focus in the midportion without definite acoustic shadowing. Left Kidney: Renal measurements: 9.91 x 5.14 x 4 cm = volume: 106.7 mL. There is no hydronephrosis. There is increased cortical echogenicity. Bladder: Appears normal for degree of bladder distention. Other: Small bilateral pleural effusions are seen. IMPRESSION: There is no hydronephrosis. Increased cortical echogenicity suggest medical renal disease. There is 5 mm hyperechoic focus in the midportion of right kidney suggesting possible nonobstructing calculus or partial volume averaging artifact due to renal sinus fat. Electronically Signed   By: Elmer Picker M.D.   On: 02/09/2022 21:54    Medications: Infusions:  ferric gluconate (FERRLECIT) IVPB 250 mg (02/10/22 1828)    Scheduled Medications:  amLODipine  5 mg Oral Daily   heparin  5,000 Units Subcutaneous Q8H   insulin aspart  0-9 Units Subcutaneous TID WC   QUEtiapine  50 mg Oral QHS    have reviewed scheduled and prn medications.  Physical Exam: General:NAD, comfortable Heart:RRR, s1s2 nl Lungs:clear b/l, no crackle Abdomen:soft, Non-tender, non-distended Extremities:No edema Neurology: Alert, awake and following commands.  Leah Skora Prasad Anaijah Augsburger 02/11/2022,10:38 AM  LOS: 3 days

## 2022-02-11 NOTE — Plan of Care (Signed)
  Problem: Coping: Goal: Ability to adjust to condition or change in health will improve Outcome: Progressing   Problem: Fluid Volume: Goal: Ability to maintain a balanced intake and output will improve Outcome: Progressing   Problem: Health Behavior/Discharge Planning: Goal: Ability to identify and utilize available resources and services will improve Outcome: Progressing   Problem: Health Behavior/Discharge Planning: Goal: Ability to manage health-related needs will improve Outcome: Progressing   Problem: Metabolic: Goal: Ability to maintain appropriate glucose levels will improve Outcome: Progressing   

## 2022-02-11 NOTE — Progress Notes (Addendum)
HD#3 Subjective:   Summary: Lorraine Turner is a 62 y.o. female with PMH of HTN, HLD, CKD IV, bipolar disorder and schizoaffective disorder who presents with dyspnea and admitted for acute CHF secondary to uncontrolled HTN.  Overnight Events: none  Patient feeling well. No new concerns. Had bowel movement last night. Did refuse AM labs because of daily draw. She is agreeable to have them redraw labs.    Objective:  Vital signs in last 24 hours: Vitals:   02/10/22 1407 02/10/22 1515 02/10/22 2021 02/11/22 0545  BP: (!) 172/65 (!) 176/71 (!) 171/75 (!) 181/70  Pulse:  65 62 60  Resp:  20 20 20   Temp:  98.7 F (37.1 C) 98.8 F (37.1 C) 98.7 F (37.1 C)  TempSrc:  Oral Oral Oral  SpO2:  94% 95% 95%  Weight:    57.7 kg  Height:       Supplemental O2: Room Air SpO2: 95 %   Physical Exam:  Constitutional: resting in bed comfortably, in no acute distress HENT: normocephalic atraumatic Cardiovascular: regular rate and rhythm, no LE edema Pulmonary/Chest: non-labored breathing on room air, lungs clear to auscultation Neurological: alert & oriented  Skin: warm and dry Psych: pleasant mood  Filed Weights   02/09/22 0519 02/10/22 0411 02/11/22 0545  Weight: 62 kg 61.8 kg 57.7 kg     Intake/Output Summary (Last 24 hours) at 02/11/2022 0627 Last data filed at 02/11/2022 0546 Gross per 24 hour  Intake 840 ml  Output 2150 ml  Net -1310 ml    Net IO Since Admission: -2,293 mL [02/11/22 0627]  Pertinent Labs:    Latest Ref Rng & Units 02/09/2022   12:33 AM 02/08/2022   12:54 AM 02/06/2022    6:46 PM  CBC  WBC 4.0 - 10.5 K/uL 6.1  7.2  8.6   Hemoglobin 12.0 - 15.0 g/dL 7.6  7.3  8.8   Hematocrit 36.0 - 46.0 % 22.1  21.2  27.4   Platelets 150 - 400 K/uL 220  199  238        Latest Ref Rng & Units 02/10/2022   12:31 AM 02/09/2022   12:33 AM 02/08/2022    3:11 PM  CMP  Glucose 70 - 99 mg/dL 162  251  203   BUN 8 - 23 mg/dL 59  65  64   Creatinine 0.44 - 1.00  mg/dL 5.63  5.86  5.78   Sodium 135 - 145 mmol/L 139  139  139   Potassium 3.5 - 5.1 mmol/L 4.5  4.1  3.6   Chloride 98 - 111 mmol/L 106  105  105   CO2 22 - 32 mmol/L 22  26  23    Calcium 8.9 - 10.3 mg/dL 8.4  8.2  8.0     Imaging: No results found.  Assessment/Plan:   Principal Problem:   Acute CHF (congestive heart failure) (HCC) Active Problems:   Essential hypertension   Chronic kidney disease (CKD), stage IV (severe) (HCC)   AKI (acute kidney injury) ([place])   Acute heart failure ([place])   Patient Summary: Lorraine Turner is a 62 y.o. with a pertinent PMH of HTN, HLD, CKD IV, bipolar disorder and schizoaffective disorder who presents with dyspnea and admitted for acute CHF secondary to uncontrolled HTN  Acute exacerbation of HFpEF 2/2 uncontrolled HTN Documented 2150 L output yesterday and net negative. Respiratory and volume status have improved.   -strict I/O's -amlodipine  HTN Systolic in 295-284X. Diastolic  pressure better in 70s. Will increase amlodipine to 10 mg, closely watch for any drastic changes.  -increase amlodipine to 10 mg  AKI on CKD V  Creatinine trending down 5.63 from 5.86 after holding diuresis. Admission was 4.79. Still has good urine output. Renal US shows previously seen medical kidney disease. Pending vein mapping, VVS consult after.  -appreciate nephrology assistance -f/u vein mapping  -plan to consult VVS tomorrow -Renal function panel  Anemia in setting of CKD Likely mixed anemia in setting of CKD and possibly iron deficiency. Received ferric gluconate. Refused AM labs today because of daily draw, but willing to have them drawn later, will try to space them out if possible.  -CBC  T2DM A1c 6.7%. Not on any medications at home. CBG within goal. -SSI with meals  Bipolar disorder Schizoaffective disorder  Restarted home Seroquel. Repeat EKG showed QTC 482. Patient states this medication helps significantly with her mood and sleep. Will  continue Seroquel with close monitoring. Patient was tearful after discussion about possible dialysis. Would like to talk to chaplain.  -Seroquel 50 mg qhs -consult to spiritual care  TOC Patient is currently unhomed and living in a friend's car. No other stable housing. SW will provide shelter list/resources. Amlodipine 5 mg and Seroquel 50 mg refills ready at [facility] (may need to adjust amlodipine if different at time of discharge).   Diet:  2 g Na  IVF: None,None VTE: Heparin Code: Full TOC recs: provided shelter list   Dispo: pending creatinine/kidney function and upper vein mapping   Angelique Blonder, DO Internal Medicine Resident PGY-1 Pager: [phone] Please contact the on call pager after 5 pm and on weekends at [phone].

## 2022-02-12 ENCOUNTER — Inpatient Hospital Stay (HOSPITAL_COMMUNITY): Payer: Medicaid Other

## 2022-02-12 DIAGNOSIS — I132 Hypertensive heart and chronic kidney disease with heart failure and with stage 5 chronic kidney disease, or end stage renal disease: Secondary | ICD-10-CM

## 2022-02-12 DIAGNOSIS — N185 Chronic kidney disease, stage 5: Secondary | ICD-10-CM

## 2022-02-12 DIAGNOSIS — Z87891 Personal history of nicotine dependence: Secondary | ICD-10-CM | POA: Diagnosis not present

## 2022-02-12 DIAGNOSIS — I5031 Acute diastolic (congestive) heart failure: Secondary | ICD-10-CM | POA: Diagnosis not present

## 2022-02-12 LAB — RENAL FUNCTION PANEL
Albumin: 3 g/dL — ABNORMAL LOW (ref 3.5–5.0)
Anion gap: 11 (ref 5–15)
BUN: 56 mg/dL — ABNORMAL HIGH (ref 8–23)
CO2: 24 mmol/L (ref 22–32)
Calcium: 8.5 mg/dL — ABNORMAL LOW (ref 8.9–10.3)
Chloride: 102 mmol/L (ref 98–111)
Creatinine, Ser: 5.42 mg/dL — ABNORMAL HIGH (ref 0.44–1.00)
GFR, Estimated: 8 mL/min — ABNORMAL LOW (ref >60)
Glucose, Bld: 196 mg/dL — ABNORMAL HIGH (ref 70–99)
Phosphorus: 4.2 mg/dL (ref 2.5–4.6)
Potassium: 4.4 mmol/L (ref 3.5–5.1)
Sodium: 137 mmol/L (ref 135–145)

## 2022-02-12 LAB — GLUCOSE, CAPILLARY
Glucose-Capillary: 124 mg/dL — ABNORMAL HIGH (ref 70–99)
Glucose-Capillary: 96 mg/dL (ref 70–99)
Glucose-Capillary: 151 mg/dL — ABNORMAL HIGH (ref 70–99)
Glucose-Capillary: 129 mg/dL — ABNORMAL HIGH (ref 70–99)

## 2022-02-12 MED ORDER — HYDROXYZINE HCL 25 MG PO TABS: 25.0000 mg | ORAL_TABLET | Freq: Three times a day (TID) | ORAL | Status: DC | PRN

## 2022-02-12 MED ORDER — HYDROXYZINE HCL 10 MG PO TABS
10.0000 mg | ORAL_TABLET | Freq: Three times a day (TID) | ORAL | Status: DC | PRN
  Administered 2022-02-12: 10 mg via ORAL
  Filled 2022-02-12 (×2): qty 1

## 2022-02-12 NOTE — Progress Notes (Signed)
Subjective:   Patient is feeling better physically but feeling "mentally poorly today" in regards to dialysis planning. She reiterated her mom's history with dialysis and her history of mental health struggles for which she has been hospitalized in the past. She is not suicidal today. She is holding on to hope, reciting serenity prayer to help her cope. She would like to see the chaplain today. Discussed plan for chaplain visit today, vein mapping, and vascular surgery consult.   Objective:  Vital signs in last 24 hours: Vitals:   02/11/22 0914 02/11/22 1630 02/11/22 1958 02/12/22 0427  BP: (!) 173/71 (!) 173/72 (!) 199/75 (!) 148/59  Pulse: 70 62 67 63  Resp: 18 20 20 18   Temp: 99.2 F (37.3 C) 98.4 F (36.9 C) 97.6 F (36.4 C) 97.8 F (36.6 C)  TempSrc: Oral Oral Oral Oral  SpO2: 96% 95% 99% 100%  Weight:    57 kg  Height:       Physical Exam: Constitutional: resting in bed comfortably, in no acute distress Cardiovascular: regular rate and rhythm, no LE edema Pulmonary/Chest: normal work of breathing on room air, no crackles or wheezes Neurological: alert & oriented  Skin: warm and dry Psych: tearful  Assessment/Plan:  Principal Problem:   Acute CHF (congestive heart failure) (HCC) Active Problems:   Essential hypertension   Chronic kidney disease (CKD), stage IV (severe) (HCC)   AKI (acute kidney injury) ([place])   Acute heart failure (HCC)  Lorraine Turner is a 62 y.o. with a pertinent PMH of HTN, HLD, CKD IV, bipolar disorder and schizoaffective disorder who presented with dyspnea and was admitted for acute CHF exacerbation secondary to uncontrolled HTN.    #Acute exacerbation of HFpEF 2/2 uncontrolled HTN #HTN 1.2 L urine output yesterday. Appears euvolemic today. Remains hypertensive this morning at 148/59. Will continue to monitor BP following increase amlodipine dose today. No diuresis at this time.  -Amlodipine increased to 10 mg yesterday  -Strict I/O's    #AKI on CKD V  Renal US demonstrates previously known medical kidney disease. Creatinine continues to trend downward and is 5.42 this morning. Having good urine output. Patient is hesitant to start process of dialysis but is open to speaking with vascular surgery. Vascular surgery was consulted for vein mapping. If patient is unwilling to proceed with initial steps for dialysis while inpatient, this process may have to be carried out as an outpatient.  -F/u nephrology recommendations -F/u vein mapping  -Trend renal function panel   #Anemia in setting of CKD Likely secondary to CKD and potentially iron deficiency. Received ferric gluconate. Hgb improved to 8.0 yesterday from 7.6.  -Trend CBC   #T2DM A1c 6.7% in 01/2022. Not taking any medications at home. CBGs remain in low-high 100s over the last 24 hours.  -SSI set to sensitive   #Bipolar disorder #Schizoaffective disorder  Restarted home Seroquel. Repeat EKG showed QTC 482. Will monitor closely and avoid other Qtc prolonging medications. Patient would like to speak with chaplain regarding dialysis. Patient is tearful but denies SI at this time. Will continue to monitor her symptoms.   -Seroquel 50 mg qhs -Spiritual care consult -PRN hydroxyzine 10 mg TID for anxiety  #TOC Currently unhomed and living in a friends car. Shelter list/resources provided by SW. Will discharge on Amlodipine 5 mg and Seroquel 50 mg via TOC pharmacy.  Diet: 2g Na Bowel: none VTE: heparin IVF: none Code: Full   Prior to Admission Living Arrangement: unhomed Anticipated Discharge Location: TBD  Barriers to Discharge: continued management Dispo: Anticipated discharge in approximately less than 2 day(s).   Starlyn Skeans, MD 02/12/2022, 6:07 AM Pager: [phone] After 5pm on weekdays and 1pm on weekends: On Call pager [phone]

## 2022-02-12 NOTE — Progress Notes (Signed)
   02/12/22 1600  Mobility  Activity Ambulated independently in hallway  Level of Assistance Independent  Assistive Device None  Distance Ambulated (ft) 1000 ft  Activity Response Tolerated well  Mobility Referral Yes  $Mobility charge 1 Mobility   Mobility Specialist Progress Note  Received pt in room having no complaints and agreeable to mobility. Pt was asymptomatic throughout ambulation and returned to room w/o fault. Left in room w/ call bell in reach and all needs met.   Lucious Groves Mobility Specialist  Please contact via SecureChat or Rehab office at [phone]

## 2022-02-12 NOTE — TOC Initial Note (Signed)
Transition of Care [facility]) - Initial/Assessment Note    Patient Details  Name: Lorraine Turner MRN: [id number] Date of Birth: Apr 03, 1960  [facility] CM/SW Contact:    Zenon Mayo, RN Phone Number: 02/12/2022, 3:11 PM  Clinical Narrative:                 NCM went in to speak with patient to give her some resources, she screamed at Lorraine Turner and hit the table and states she does not want any more housing resources.  And walked out of the room.    Expected Discharge Plan: Homeless Shelter Barriers to Discharge: Continued Medical Work up   Patient Goals and CMS Choice Patient states their goals for this hospitalization and ongoing recovery are:: homeless          Expected Discharge Plan and Services   Discharge Planning Services: CM Consult   Living arrangements for the past 2 months: Homeless                   DME Agency: NA       HH Arranged: NA          Prior Living Arrangements/Services Living arrangements for the past 2 months: Homeless Lives with:: Self Patient language and need for interpreter reviewed:: Yes        Need for Family Participation in Patient Care: No (Comment) Care giver support system in place?: No (comment)   Criminal Activity/Legal Involvement Pertinent to Current Situation/Hospitalization: No - Comment as needed  Activities of Daily Living Home Assistive Devices/Equipment: None ADL Screening (condition at time of admission) Patient's cognitive ability adequate to safely complete daily activities?: Yes Is the patient deaf or have difficulty hearing?: No Does the patient have difficulty seeing, even when wearing glasses/contacts?: No Does the patient have difficulty concentrating, remembering, or making decisions?: No Patient able to express need for assistance with ADLs?: No Does the patient have difficulty dressing or bathing?: No Independently performs ADLs?: Yes (appropriate for developmental age) Does the patient  have difficulty walking or climbing stairs?: No Weakness of Legs: Both Weakness of Arms/Hands: None  Permission Sought/Granted                  Emotional Assessment Appearance:: Appears stated age Attitude/Demeanor/Rapport: Angry Affect (typically observed): Angry Orientation: : Oriented to Self, Oriented to Place, Oriented to  Time, Oriented to Situation Alcohol / Substance Use: Not Applicable Psych Involvement: No (comment)  Admission diagnosis:  Heart failure and kidney disease due to high blood pressure (HCC) [I13.0] Acute on chronic HFrEF (heart failure with reduced ejection fraction) (HCC) [I50.23] Hypertensive heart disease with congestive heart failure, unspecified heart failure type (Lorraine Turner) [I11.0] Acute heart failure (HCC) [I50.9] Patient Active Problem List   Diagnosis Date Noted   Stage 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 chronic dialysis (Lorraine Turner) 02/12/2022   Acute CHF (congestive heart failure) (Lorraine Turner) 02/08/2022   AKI (acute kidney injury) (Lorraine Turner) 02/08/2022   Acute heart failure (Lorraine Turner) 02/08/2022   Chronic kidney disease (CKD), stage IV (severe) (HCC)    Asymptomatic hypertensive urgency 10/15/2021   Intractable nausea and vomiting 12/15/2020   Hypokalemia 12/15/2020   UTI (urinary tract infection) 11/24/2020   Acute renal failure superimposed on stage 4 chronic kidney disease (HCC)    Hypertensive urgency 09/01/2020   CKD (chronic kidney disease), stage III (Lorraine Turner) 09/01/2020   DKA (diabetic ketoacidoses) 09/12/2018   Suicidal ideation 09/12/2018   Adjustment disorder with mixed disturbance of emotions and conduct  MDD (major depressive disorder), severe ([place]) 25/42/7062   Umbilical hernia with obstruction 11/24/2015   Symptomatic cholelithiasis 11/24/2015   Type 2 diabetes mellitus with hyperglycemia (Lorraine Turner)    Suicide attempt (Lorraine Turner) 05/06/2014   Schizoaffective disorder, depressive type (Lorraine Turner) 08/14/2011   UNSPECIFIED ANEMIA 01/23/2009   Essential hypertension  10/12/2008   HYPERCHOLESTEROLEMIA 01/01/2007   PANIC DISORDER 01/01/2007   Obsessive compulsive disorder 01/01/2007   PCP:  Lorraine Rakes, MD Pharmacy:   [facility] [place] [address] Phone: [phone] Fax: [phone]  [id number] - [place], [place] - [address] [place] [address] Phone: [phone] Fax: [phone]  [facility] [address] [place] [address] Phone: [phone] Fax: [place] [address], [place] [address] Phone: [phone] Fax: [phone]  [id number] [address] [place] [address] Phone: [phone] Fax: [phone]     Social Determinants of Health ([place]) Social History: [place]: No Food Insecurity (02/07/2022)  Housing: High Risk (02/07/2022)  Transportation Needs: Unmet Transportation Needs (02/07/2022)  Utilities: Not At Risk (02/07/2022)  Alcohol Screen: Low Risk  (11/17/2020)  Depression (PHQ2-9): Low Risk  (10/25/2020)  Recent Concern: Depression (PHQ2-9) - Medium Risk (09/26/2020)  Tobacco Use: Medium Risk (02/06/2022)   SDOH Interventions: Food Insecurity Interventions: Intervention Not Indicated Housing Interventions: Inpatient TOC Transportation Interventions: Bus Pass Given Utilities Interventions: Intervention Not Indicated   Readmission Risk Interventions    02/12/2022    2:59 PM 11/26/2020   11:18 AM 11/24/2020    9:15 AM  Readmission Risk Prevention Plan  Transportation Screening Complete Complete Complete  PCP or Specialist Appt within 3-5 Days  Complete Complete  HRI or Home Care Consult  Complete Complete  Social Work Consult for [place] Planning/Counseling Complete Complete  Complete  Palliative Care Screening Not Applicable Not Applicable Not Applicable  Medication Review Press photographer) Complete Complete Complete

## 2022-02-12 NOTE — Progress Notes (Signed)
Lorraine Turner  Assessment/ Plan:  # AKI on CKD 5 - b/l creat 4.8- 5.1 from Sept 2023, eGFR 9-10 ml/min. Creat here was 4.7 on admit in setting of SOB and vol overload w/ pleural effusions on CXR and new LE edema. Pt was diuresed w/ good UOP and improvement in SOB and leg swelling. Creat increased up to 5.8. Lasix had been stopped 12/29. Work-up shows UA is unremarkable, renal US shows ^'d echo w/o obstruction. Suspect advanced CKD due to DM/ HTN. Suspect AKI here is related to IV diuretics which was needed for vol management.  Both BUN and creatinine level downtrending.  Patient has no overt uremic symptoms therefore no urgent need for dialysis.  Patient has CKD and was on denial of it and no nephrologist set up as outpatient.   No need for dialysis and patient does not want to have AV fistula surgery during this hospitalization.  She is willing to follow with [place] at [place] [facility].  I think she can be discharged from hospital after vein mapping, we will set up outpatient follow-up in the next 1 to 2 weeks.  Patient verbalized understanding.  # HTN/Volume - looks euvolemic, no further diuretics needed.  Increased amlodipine to 10 mg for the management of hypertension.  #Anemia due to esrd -  tsat 17%, ferritin 79.  Treating with ferric gluconate 250 mg IV qd x 2.   #MBD ckd - CCa in range, phos 3s. No binder needs for now.  Sign off, call back with question.  Plan as outlined above.  Subjective: Seen and examined at bedside.  Patient is walking in the room.  Denies nausea, vomiting, chest pain, shortness of breath, dysgeusia.  Urine output is around 1.2 L documented.  Not willing to undergo surgical intervention for fistula.  Objective Vital signs in last 24 hours: Vitals:   02/11/22 1630 02/11/22 1958 02/12/22 0427 02/12/22 0731  BP: (!) 173/72 (!) 199/75 (!) 148/59 (!) 153/68  Pulse: 62 67 63 63  Resp: _0 Temp: 98.4 F (36.9 C) 97.6  F (36.4 C) 97.8 F (36.6 C) 98.5 F (36.9 C)  TempSrc: Oral Oral Oral Oral  SpO2: 95% 99% 100% 97%  Weight:   57 kg   Height:       Weight change: -0.7 kg  Intake/Output Summary (Last 24 hours) at 02/12/2022 0957 Last data filed at 02/12/2022 0847 Gross per 24 hour  Intake 730 ml  Output 1625 ml  Net -895 ml        Labs: RENAL PANEL Recent Labs    10/13/21 2015 10/14/21 2129 10/15/21 0520 10/16/21 0451 02/06/22 1846 02/07/22 1846 02/08/22 0054 02/08/22 1133 02/08/22 1511 02/09/22 0033 02/09/22 1830 02/10/22 0031 02/12/22 0031  NA _1  --  139 137  K 3.9 4.4 4.0 3.8 4.2 3.5 3.6 3.2* 3.6 4.1  --  4.5 4.4  CL _2  --  106 102  CO_3 26  --  22 24  GLUCOSE 159* 183* 145* 117* 98 211* 105* 79 203* 251*  --  162* 196*  BUN 56* 64* 64* 58* 58* 56* 62* 62* 64* 65*  --  59* 56*  CREATININE 5.13* 5.27* 5.17* 4.80* 4.79* 4.94* 5.44* 5.53* 5.78* 5.86*  --  5.63* 5.42*  CALCIUM 10.1 9.3 8.9 8.5*  9.0 8.0* 8.3* 8.2* 8.0* 8.2*  --  8.4* 8.5*  MG  --   --  2.5*  --   --  2.0  --   --   --   --   --   --   --   PHOS  --   --  4.7* 4.0  --   --   --   --   --   --  3.6 3.8 4.2  ALBUMIN 4.1 3.7 3.7 3.1*  --  3.0*  --   --   --   --   --  2.9* 3.0*      Liver Function Tests: Recent Labs  Lab 02/07/22 1846 02/10/22 0031 02/12/22 0031  AST 19  --   --   ALT 22  --   --   ALKPHOS 64  --   --   BILITOT 0.8  --   --   PROT 6.2*  --   --   ALBUMIN 3.0* 2.9* 3.0*    No results for input(s): "LIPASE", "AMYLASE" in the last 168 hours. No results for input(s): "AMMONIA" in the last 168 hours. CBC: Recent Labs    10/15/21 0520 02/06/22 1846 02/08/22 0054 02/09/22 0033 02/09/22 1830 02/11/22 1020  HGB 9.6* 8.8* 7.3* 7.6*  --  8.0*  MCV 94.3 94.5 90.2 89.1  --  92.0  VITAMINB12 382  --   --   --   --   --   FOLATE 10.5  --   --   --   --   --   FERRITIN 123  --   --  66 79   --   TIBC 304  --   --  300 330  --   IRON 69  --   --  38 55  --      Cardiac Enzymes: No results for input(s): "CKTOTAL", "CKMB", "CKMBINDEX", "TROPONINI" in the last 168 hours. CBG: Recent Labs  Lab 02/11/22 0642 02/11/22 1202 02/11/22 1631 02/11/22 2111 02/12/22 0613  GLUCAP 99 143* 167* 189* 124*     Iron Studies:  Recent Labs    02/09/22 1830  IRON 55  TIBC 330  FERRITIN 79    Studies/Results: No results found.  Medications: Infusions:    Scheduled Medications:  amLODipine  10 mg Oral Daily   heparin  5,000 Units Subcutaneous Q8H   insulin aspart  0-9 Units Subcutaneous TID WC   QUEtiapine  50 mg Oral QHS    have reviewed scheduled and prn medications.  Physical Exam: General:NAD, comfortable Heart:RRR, s1s2 nl Lungs:clear b/l, no crackle Abdomen:soft, Non-tender, non-distended Extremities:No edema Neurology: Alert, awake and following commands.  Lorraine Turner Lorraine Turner 02/12/2022,9:57 AM  LOS: 4 days

## 2022-02-12 NOTE — TOC Transition Note (Signed)
Discharge medications (2) have been retrieved from Main Pharmacy weekend lockup and are now being stored in the Transitions of Care (TOC) Pharmacy on the second floor until patient is ready for discharge.   

## 2022-02-12 NOTE — Progress Notes (Signed)
Attempted to call ultrasound and vascular lab regarding patient's vein mapping order. No answer as of now. MD notified.

## 2022-02-12 NOTE — Plan of Care (Signed)
  Problem: Coping: Goal: Ability to adjust to condition or change in health will improve Outcome: Progressing   Problem: Fluid Volume: Goal: Ability to maintain a balanced intake and output will improve Outcome: Progressing   Problem: Health Behavior/Discharge Planning: Goal: Ability to identify and utilize available resources and services will improve Outcome: Progressing   Problem: Health Behavior/Discharge Planning: Goal: Ability to manage health-related needs will improve Outcome: Progressing   

## 2022-02-13 ENCOUNTER — Inpatient Hospital Stay (HOSPITAL_COMMUNITY): Payer: Medicaid Other

## 2022-02-13 DIAGNOSIS — N185 Chronic kidney disease, stage 5: Secondary | ICD-10-CM | POA: Diagnosis not present

## 2022-02-13 DIAGNOSIS — I132 Hypertensive heart and chronic kidney disease with heart failure and with stage 5 chronic kidney disease, or end stage renal disease: Secondary | ICD-10-CM | POA: Diagnosis not present

## 2022-02-13 DIAGNOSIS — I10 Essential (primary) hypertension: Secondary | ICD-10-CM

## 2022-02-13 DIAGNOSIS — I5031 Acute diastolic (congestive) heart failure: Secondary | ICD-10-CM | POA: Diagnosis not present

## 2022-02-13 DIAGNOSIS — Z87891 Personal history of nicotine dependence: Secondary | ICD-10-CM | POA: Diagnosis not present

## 2022-02-13 LAB — BASIC METABOLIC PANEL
Anion gap: 12 (ref 5–15)
BUN: 55 mg/dL — ABNORMAL HIGH (ref 8–23)
CO2: 24 mmol/L (ref 22–32)
Calcium: 8.8 mg/dL — ABNORMAL LOW (ref 8.9–10.3)
Chloride: 102 mmol/L (ref 98–111)
Creatinine, Ser: 5.49 mg/dL — ABNORMAL HIGH (ref 0.44–1.00)
GFR, Estimated: 8 mL/min — ABNORMAL LOW (ref >60)
Glucose, Bld: 162 mg/dL — ABNORMAL HIGH (ref 70–99)
Potassium: 4.7 mmol/L (ref 3.5–5.1)
Sodium: 138 mmol/L (ref 135–145)

## 2022-02-13 LAB — GLUCOSE, CAPILLARY
Glucose-Capillary: 111 mg/dL — ABNORMAL HIGH (ref 70–99)
Glucose-Capillary: 148 mg/dL — ABNORMAL HIGH (ref 70–99)
Glucose-Capillary: 138 mg/dL — ABNORMAL HIGH (ref 70–99)

## 2022-02-13 MED ORDER — AMLODIPINE BESYLATE 10 MG PO TABS
10.0000 mg | ORAL_TABLET | Freq: Every day | ORAL | 0 refills | Status: DC
  Filled 2022-02-13: qty 30, 30d supply, fill #0

## 2022-02-13 MED ORDER — QUETIAPINE FUMARATE 50 MG PO TABS
50.0000 mg | ORAL_TABLET | Freq: Every day | ORAL | 0 refills | Status: DC
  Filled 2022-02-13: qty 30, 30d supply, fill #0

## 2022-02-13 NOTE — TOC Transition Note (Signed)
Transition of Care [facility]) - CM/SW Discharge Note   Patient Details  Name: Lorraine Turner MRN: [id number] Date of Birth: 10/12/1960  [facility] CM/SW Contact:  Zenon Mayo, RN Phone Number: 02/13/2022, 3:52 PM   Clinical Narrative:    NCM Jane at Transitions of Care at [facility] clinic spoke with patient over the phone and she is going to make patient a follow up apt at the [facility] clinic for patient. Patient states she stays in a friends car but she can take the bus to the follow up apt. The CSW has given her a bus pass already.  She is for possible dc today.     Final next level of care: Homeless Shelter Barriers to Discharge: Continued Medical Work up   Patient Goals and CMS Choice      Discharge Placement                         Discharge Plan and Services Additional resources added to the After Visit Summary for     Discharge Planning Services: CM Consult              DME Agency: NA       HH Arranged: NA          Social Determinants of Health (SDOH) Interventions SDOH Screenings   Food Insecurity: No Food Insecurity (02/07/2022)  Housing: High Risk (02/07/2022)  Transportation Needs: Unmet Transportation Needs (02/07/2022)  Utilities: Not At Risk (02/07/2022)  Alcohol Screen: Low Risk  (11/17/2020)  Depression (PHQ2-9): Low Risk  (10/25/2020)  Recent Concern: Depression (PHQ2-9) - Medium Risk (09/26/2020)  Tobacco Use: Medium Risk (02/06/2022)     Readmission Risk Interventions    02/12/2022    2:59 PM 11/26/2020   11:18 AM 11/24/2020    9:15 AM  Readmission Risk Prevention Plan  Transportation Screening Complete Complete Complete  PCP or Specialist Appt within 3-5 Days  Complete Complete  HRI or Home Care Consult  Complete Complete  Social Work Consult for [place] Planning/Counseling Complete Complete Complete  Palliative Care Screening Not Applicable Not Applicable Not Applicable  Medication Review Press photographer)  Complete Complete Complete

## 2022-02-13 NOTE — Progress Notes (Signed)
RN went over discharge instructions with patient at bedside. NT removed PIV. Patient is dressed. TOC meds given from pharmacy to patient at bedside. Patient received bus passes for transportation.

## 2022-02-13 NOTE — TOC Progression Note (Signed)
Transition of Care [facility]) - Progression Note    Patient Details  Name: Lorraine Turner MRN: [id number] Date of Birth: [birth year]  Transition [facility] Care [facility]) CM/SW Contact  Zenon Mayo, RN Phone Number: 02/13/2022, 1:45 PM  Clinical Narrative:    Dory Larsen at Transitions of Care at [facility] clinic spoke with patient over the phone and she is going to make patient a follow up apt at the [facility] clinic for patient.  Patient states she stays in a friends car  but she can take the bus to the follow up apt.  The CSW has given her a bus pass already.  TOC following.    Expected Discharge Plan: Homeless Shelter Barriers to Discharge: Continued Medical Work up  Expected Discharge Plan and Services   Discharge Planning Services: CM Consult   Living arrangements for the past 2 months: Homeless                   DME Agency: NA       HH Arranged: NA           Social Determinants of Health ([place]) Interventions [place]: No Food Insecurity (02/07/2022)  Housing: High Risk (02/07/2022)  Transportation Needs: Unmet Transportation Needs (02/07/2022)  Utilities: Not At Risk (02/07/2022)  Alcohol Screen: Low Risk  (11/17/2020)  Depression (PHQ2-9): Low Risk  (10/25/2020)  Recent Concern: Depression (PHQ2-9) - Medium Risk (09/26/2020)  Tobacco Use: Medium Risk (02/06/2022)    Readmission Risk Interventions    02/12/2022    2:59 PM 11/26/2020   11:18 AM 11/24/2020    9:15 AM  Readmission Risk Prevention Plan  Transportation Screening Complete Complete Complete  PCP or Specialist Appt within 3-5 Days  Complete Complete  HRI or Home Care Consult  Complete Complete  Social Work Consult for [place] Planning/Counseling Complete Complete Complete  Palliative Care Screening Not Applicable Not Applicable Not Applicable  Medication Review Press photographer) Complete Complete Complete

## 2022-02-13 NOTE — TOC Progression Note (Signed)
Transition of Care [facility]) - Progression Note    Patient Details  Name: Lorraine Turner MRN: [id number] Date of Birth: 02-04-1961  Transition of Care [facility]) CM/SW [place], LCSW Phone Number: 02/13/2022, 3:38 PM  Clinical Narrative:  RN notified CSW that pt wasn't in the room at the moment. CSW gave RN bus passes for pt for DC.     Expected Discharge Plan: Homeless Shelter Barriers to Discharge: Continued Medical Work up  Expected Discharge Plan and Services   Discharge Planning Services: CM Consult   Living arrangements for the past 2 months: Homeless                   DME Agency: NA       HH Arranged: NA           Social Determinants of Health ([place]) Interventions [place]: No Food Insecurity (02/07/2022)  Housing: High Risk (02/07/2022)  Transportation Needs: Unmet Transportation Needs (02/07/2022)  Utilities: Not At Risk (02/07/2022)  Alcohol Screen: Low Risk  (11/17/2020)  Depression (PHQ2-9): Low Risk  (10/25/2020)  Recent Concern: Depression (PHQ2-9) - Medium Risk (09/26/2020)  Tobacco Use: Medium Risk (02/06/2022)    Readmission Risk Interventions    02/12/2022    2:59 PM 11/26/2020   11:18 AM 11/24/2020    9:15 AM  Readmission Risk Prevention Plan  Transportation Screening Complete Complete Complete  PCP or Specialist Appt within 3-5 Days  Complete Complete  HRI or [place]  Complete Complete  Social Work Consult for [place] Planning/Counseling Complete Complete Complete  Palliative Care Screening Not Applicable Not Applicable Not Applicable  Medication Review Press photographer) Complete Complete Complete   Beckey Rutter, MSW, LCSWA, LCASA Transitions of Care  Clinical Social Worker I

## 2022-02-13 NOTE — Plan of Care (Signed)

## 2022-02-13 NOTE — Discharge Instructions (Addendum)
You were hospitalized for heart failure and chronic kidney disease.  Hospital Course: We increased the dose of your blood pressure medication (amlodipine) to help your heart function better. You were also seen by nephrology (kidney doctors) regarding your chronic kidney disease and the need for dialysis. We got imaging of your veins for future placement of a fistula (by vascular surgery) that you will need in order to receive dialysis. You will need to follow up with nephrology in their clinic after you are discharged to discuss plans for dialysis in the future.   Medications:  Please start taking: -Amlodipine 10 mg daily  Please stop taking: -Amlodipine 5 mg  Please continue taking: -Quetiapine 50 mg daily -Fluticasone 50 mcg as needed  Follow-up: - Please follow up with your primary care provider Dr. [name] on 02/25/2022 at 10:30 AM  Phone: [phone] Address: [place] [address] [place], [place] [address]  - Please follow up with nephrology at the [facility] (please call to schedule an appointment) Phone: [phone] Address: [address] [place], [place] [address]

## 2022-02-13 NOTE — Discharge Summary (Signed)
Name: Lorraine Turner MRN: [id number] DOB: 02/03/1961 62 y.o. PCP: [name], MD  Date of Admission: 02/06/2022  5:53 PM Date of Discharge: 02/13/2022 Attending Physician: Lottie Mussel, MD  Discharge Diagnosis: 1. Principal Problem:   Acute CHF (congestive heart failure) (HCC) Active Problems:   Essential hypertension   Chronic kidney disease (CKD), stage IV (severe) (HCC)   AKI (acute kidney injury) ([place])   Acute heart failure (HCC)   Stage 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 chronic dialysis (HCC) Anemia of chronic disease Type 2 Diabetes Bipolar Disorder Schizoaffective Disorder  Discharge Medications: Allergies as of 02/13/2022       Reactions   Lisinopril Other (See Comments)   Patient does not wish to take this medication (she heard that it can make your kidneys worse)   Amoxicillin Rash        Medication List     TAKE these medications    amLODipine 10 MG tablet Commonly known as: NORVASC Take 1 tablet (10 mg total) by mouth daily. Start taking on: February 14, 2022   fluticasone 50 MCG/ACT nasal spray Commonly known as: FLONASE Place 1 spray into both nostrils daily.   QUEtiapine 50 MG tablet Commonly known as: SEROQUEL Take 1 tablet (50 mg total) by mouth at bedtime.        Disposition and follow-up:   Ms.Doyle [name] was discharged from [facility] in Good condition.  At the hospital follow up visit please address:  1.    A. Acute exacerbation of HFpEF 2/2 uncontrolled HTN   - Increased amlodipine to 10 mg daily   B. AKI on CKD V    - Patient will need close outpatient f/u with nephrology regarding further steps for initiating dialysis (fistula placement)  2.  Labs / imaging needed at time of follow-up: BMP, CBC  3.  Pending labs/ test needing follow-up: VAS [place] UPPER EXT VEIN MAPPING   Follow-up Appointments:  Follow-up Information     [name], MD. Go on 03/11/2022.   Specialty: Family Medicine Why:  @9 :30am Contact information: [place] [address] [phone]         [facility] Follow up.   Specialty: Vascular Surgery Contact information: [address] [place] [phone]               - Please follow up with nephrology at the [facility] (please call to schedule an appointment)  Hospital Course by problem list:  Lorraine Turner is a 62 y.o. with a pertinent PMH of HTN, HLD, CKD IV, bipolar disorder and schizoaffective disorder who presented with dyspnea and was admitted for acute CHF exacerbation secondary to uncontrolled HTN.    #Acute exacerbation of HFpEF 2/2 uncontrolled HTN #HTN Patient was initially diuresed with Lasix earlier in her hospital stay. EF 55-60% on echo during hospitalization. Continue to have good urine output throughout her stay.  Patient was taking amlodipine 5 mg daily prior to her hospitalization. Increased amlodipine to 10 mg daily to decrease her afterload and gain better control of her hypertension. Patient appeared euvolemic on the day of discharge. Patient was discharged on amlodipine 10 mg daily.   #AKI on CKD V  Renal US demonstrated chronic kidney disease.  Creatinine elevated on discharge but stabilized throughout her hospital stay.  Creatinine was 5.49 on the day of discharge.  Patient was seen by nephrology.  She was hesitant to receive an AV  fistula during her hospitalization.  Vascular surgery was consulted and performed vein mapping for future placement of AV fistula.  Patient will follow-up with nephrology in clinic soon after discharge to discuss need for AV fistula placement for dialysis.  This will also require follow-up with vascular surgery soon after the decision for AV fistula placement is made.   #Anemia in setting of CKD Suspected secondary to CKD w/ potential iron deficiency. Received ferric gluconate. Most recent Hgb  was stable at 8.0.   #T2DM A1c 6.7% in 01/2022. Was not on diabetic medications at home.  CBGs remained within normal limits on sliding scale insulin.  Patient not discharged on any new diabetic medications.   #Bipolar disorder #Schizoaffective disorder  Was taking Seroquel at home. Patient was intermittently tearful throughout her stay but denied SI.  I suspect that the patient would benefit from further discussion regarding her psychiatric conditions with her primary care doctor as an outpatient.  Patient was discharged on home Seroquel 50 mg nightly.  Discharge Exam:   BP (!) 157/89 (BP Location: Left Arm)   Pulse 75   Temp 98.3 F (36.8 C) (Oral)   Resp 20   Ht 5\' 2"  (1.575 m)   Wt 56.9 kg   SpO2 100%   BMI 22.94 kg/m  Constitutional: resting in bed comfortably, in no acute distress Cardiovascular: regular rate and rhythm, no LE edema Pulmonary/Chest: normal work of breathing on room air, no crackles or wheezes Neurological: alert & oriented  Skin: warm and dry Psych: pleasant  Pertinent Labs, Studies, and Procedures:     Latest Ref Rng & Units 02/11/2022   10:20 AM 02/09/2022   12:33 AM 02/08/2022   12:54 AM  CBC  WBC 4.0 - 10.5 K/uL 6.3  6.1  7.2   Hemoglobin 12.0 - 15.0 g/dL 8.0  7.6  7.3   Hematocrit 36.0 - 46.0 % 24.2  22.1  21.2   Platelets 150 - 400 K/uL 237  220  199        Latest Ref Rng & Units 02/13/2022   12:17 AM 02/12/2022   12:31 AM 02/10/2022   12:31 AM  BMP  Glucose 70 - 99 mg/dL 162  196  162   BUN 8 - 23 mg/dL 55  56  59   Creatinine 0.44 - 1.00 mg/dL 5.49  5.42  5.63   Sodium 135 - 145 mmol/L 138  137  139   Potassium 3.5 - 5.1 mmol/L 4.7  4.4  4.5   Chloride 98 - 111 mmol/L 102  102  106   CO2 22 - 32 mmol/L 24  24  22    Calcium 8.9 - 10.3 mg/dL 8.8  8.5  8.4     DG Chest 2 View  IMPRESSION: 1. Bibasilar consolidation and effusions, right greater than left. 2. Stable enlarged cardiac silhouette.   On: 02/06/2022 19:25  US RENAL   IMPRESSION: There is no hydronephrosis. Increased cortical echogenicity suggest medical renal disease. There is 5 mm hyperechoic focus in the midportion of right kidney suggesting possible nonobstructing calculus or partial volume averaging artifact due to renal sinus fat.   On: 02/09/2022 21:54  ECHOCARDIOGRAM COMPLETE  IMPRESSIONS     1. Left ventricular ejection fraction, by estimation, is 55 to 60%. The  left ventricle has normal function. The left ventricle has no regional  wall motion abnormalities. There is mild asymmetric left ventricular  hypertrophy of the septal segment. Left  ventricular diastolic parameters are indeterminate.  2. Right ventricular systolic function is normal. The right ventricular  size is normal. There is moderately elevated pulmonary artery systolic  pressure.   3. Left atrial size was mildly dilated.   4. Right atrial size was mildly dilated.   5. The mitral valve is normal in structure. Mild to moderate mitral valve  regurgitation. No evidence of mitral stenosis.   6. Tricuspid valve regurgitation is moderate.   7. The aortic valve is normal in structure. Aortic valve regurgitation is  not visualized. Aortic valve sclerosis is present, with no evidence of  aortic valve stenosis.   8. The inferior vena cava is dilated in size with >50% respiratory  variability, suggesting right atrial pressure of 8 mmHg.  On: 02/07/2022  Discharge Instructions: Discharge Instructions     Call MD for:  difficulty breathing, headache or visual disturbances   Complete by: As directed    Call MD for:  persistant dizziness or light-headedness   Complete by: As directed    Call MD for:  persistant nausea and vomiting   Complete by: As directed    Call MD for:  severe uncontrolled pain   Complete by: As directed    Diet - low sodium heart healthy   Complete by: As directed    Increase activity slowly   Complete by: As directed       You were hospitalized  for heart failure and chronic kidney disease.   Hospital Course: We increased the dose of your blood pressure medication (amlodipine) to help your heart function better. You were also seen by nephrology (kidney doctors) regarding your chronic kidney disease and the need for dialysis. We got imaging of your veins for future placement of a fistula (by vascular surgery) that you will need in order to receive dialysis. You will need to follow up with nephrology in their clinic after you are discharged to discuss plans for dialysis in the future.    Medications:   Please start taking: -Amlodipine 10 mg daily   Please stop taking: -Amlodipine 5 mg   Please continue taking: -Quetiapine 50 mg daily -Fluticasone 50 mcg as needed   Follow-up: - Please follow up with your primary care provider Dr. [name] on 02/25/2022 at 10:30 AM  Phone: [phone] Address: [place] [place], [place] [address]   - Please follow up with nephrology at the [facility] (please call to schedule an appointment) Phone: [phone] Address: [address] [place], [place] [address]  Signed: Starlyn Skeans, MD 02/13/2022, 6:09 PM   Pager: [phone]

## 2022-02-14 ENCOUNTER — Telehealth: Payer: Self-pay

## 2022-02-14 ENCOUNTER — Other Ambulatory Visit (HOSPITAL_COMMUNITY): Payer: Self-pay

## 2022-02-14 NOTE — Telephone Encounter (Signed)
Transition Care Management Follow-up Telephone Call Date of discharge and from where: 02/13/2022, [facility] How have you been since you were released from the hospital? She stated she is feeling better.  Any questions or concerns? Yes- her main concern is regarding housing.  She said she has been to the [facility] a couple of times but not in awhile.  I encouraged her to go to inquire if they are offering temporary housing at a motel this winter as they did last winter. They also have case workers who can assist with the housing search. She said she will go on Mon, 1/8.   She said the shelters have waiting lists so she does not have much of an option at this time other than to stay in the car. She has some income from her husband's social security when he passed away.  She did not want to say how much while on the phone. She was referred to [facility] for assistance with applying for disability but she said she never heard from anyone.  She explained that a lot has been going on since I last spoke to her.  She asked if Asante McCoy,LCSW is still in the office and I told her no, but we have a new SW and the patient said she would like to meet her.  Items Reviewed: Did the pt receive and understand the discharge instructions provided? Yes  Medications obtained and verified? Yes - she said she has all of her medications and took them this morning,  Other? No  Any new allergies since your discharge? No  Dietary orders reviewed? No Do you have support at home?  Currently homeless.  Staying in a friend's car.   Home Care and Equipment/Supplies: Were home health services ordered? no If so, what is the name of the agency? N/a  Has the agency set up a time to come to the patient's home? not applicable Were any new equipment or medical supplies ordered?  No What is the name of the medical supply agency? N/a Were you able to get the supplies/equipment? not applicable Do you have any questions  related to the use of the equipment or supplies? No  Functional Questionnaire: (I = Independent and D = Dependent) ADLs: independent  Follow up appointments reviewed:  PCP Hospital f/u appt confirmed? Yes  Scheduled to see Dr Margarita Rana- 02/25/2022.   [facility] f/u appt confirmed?  None scheduled yet.    Are transportation arrangements needed? No - she said she will use public transportation. If their condition worsens, is the pt aware to call PCP or go to the Emergency Dept.? Yes Was the patient provided with contact information for the PCP's office or ED? Yes Was to pt encouraged to call back with questions or concerns? Yes

## 2022-02-14 NOTE — Telephone Encounter (Signed)
From the discharge call:  She stated she is feeling better.   She is currently homeless, staying in a friend's car,  and her main concern is  housing.  She said she has been to the [facility] a couple of times but not in awhile.  I encouraged her to go to inquire if they are offering temporary housing at a motel this winter as they did last winter. They also have case workers who can assist with the housing search. She said she will go on Mon, 1/8.    She said the shelters have waiting lists so she does not have much of an option at this time other than to stay in the car.  She has some income from her husband's social security when he passed away.  She did not want to say how much while on the phone. She was referred to [facility] for assistance with applying for disability but she said she never heard from anyone.  She explained that a lot has been going on since I last spoke to her.   She asked if Asante McCoy,LCSW is still in the office and I told her no, but we have a new SW and the patient said she would like to meet her.   she said she has all of her medications and took them this morning  Scheduled to see Dr Margarita Rana- 02/25/2022.

## 2022-02-19 NOTE — Telephone Encounter (Signed)
Read Jane's note and ask patient if she would like to see me Monday before or after her appointment with [facility] on Monday.

## 2022-02-20 NOTE — Telephone Encounter (Signed)
Called and lvm for patient

## 2022-02-25 ENCOUNTER — Telehealth: Payer: Self-pay

## 2022-02-25 ENCOUNTER — Ambulatory Visit: Payer: Medicaid Other | Attending: Family Medicine | Admitting: Family Medicine

## 2022-02-25 ENCOUNTER — Other Ambulatory Visit: Payer: Self-pay

## 2022-02-25 ENCOUNTER — Encounter: Payer: Self-pay | Admitting: Family Medicine

## 2022-02-25 VITALS — BP 182/83 | HR 70 | Ht 62.0 in | Wt 140.6 lb

## 2022-02-25 DIAGNOSIS — E1169 Type 2 diabetes mellitus with other specified complication: Secondary | ICD-10-CM

## 2022-02-25 DIAGNOSIS — I10 Essential (primary) hypertension: Secondary | ICD-10-CM | POA: Diagnosis not present

## 2022-02-25 DIAGNOSIS — Z5900 Homelessness unspecified: Secondary | ICD-10-CM

## 2022-02-25 DIAGNOSIS — N185 Chronic kidney disease, stage 5: Secondary | ICD-10-CM

## 2022-02-25 DIAGNOSIS — L603 Nail dystrophy: Secondary | ICD-10-CM

## 2022-02-25 DIAGNOSIS — I120 Hypertensive chronic kidney disease with stage 5 chronic kidney disease or end stage renal disease: Secondary | ICD-10-CM

## 2022-02-25 DIAGNOSIS — E1122 Type 2 diabetes mellitus with diabetic chronic kidney disease: Secondary | ICD-10-CM | POA: Diagnosis not present

## 2022-02-25 DIAGNOSIS — Z8659 Personal history of other mental and behavioral disorders: Secondary | ICD-10-CM

## 2022-02-25 MED ORDER — CLONIDINE HCL 0.1 MG PO TABS
0.1000 mg | ORAL_TABLET | Freq: Once | ORAL | Status: AC
  Administered 2022-02-25: 0.1 mg via ORAL

## 2022-02-25 MED ORDER — CARVEDILOL 6.25 MG PO TABS
6.2500 mg | ORAL_TABLET | Freq: Two times a day (BID) | ORAL | 3 refills | Status: DC
  Filled 2022-02-25 (×2): qty 60, 30d supply, fill #0

## 2022-02-25 MED ORDER — CARVEDILOL 6.25 MG PO TABS: 6.2500 mg | ORAL_TABLET | Freq: Two times a day (BID) | ORAL | 3 refills | Status: DC

## 2022-02-25 NOTE — Progress Notes (Signed)
Subjective:  Patient ID: Lorraine Turner, female    DOB: July 30, 1960  Age: 62 y.o. MRN: [id number]  CC: Hypertension and Hospitalization Follow-up   HPI Lorraine Turner is a 62 y.o. year old female with a history of schizoaffective affective disorder, hypertension, diabetic neuropathy, type 2 diabetes mellitus (A1c 6.7), stage V CKD who presents today for a follow-up visit.  She had a hospitalization for acute CHF exacerbation from 02/06/2022 through 02/13/2022.  She had presented with dyspnea and was diuresed with IV Lasix, seen by nephrology but dialysis is not indicated at this time.  Vein mapping was done with recommendation to follow-up with nephrology outpatient.  Interval History:   She states she feels good and has no dyspnea, orthopnea or pedal edema.  Her blood pressure is significantly elevated and she endorses taking her antihypertensive today. She is yet to make an appointment with [facility].  She breaks down crying as she states she is scared of going on hemodialysis.  Currently does not have a Psychiatrist. She states she does not sleep well as her thoughts are all over the place. On further questioning she states she skipped Seroquel last night so she would be able to wake up as it takes a long time to wear off.  At the moment she is homeless. Denies presence of additional concerns. Past Medical History:  Diagnosis Date   AKI (acute kidney injury) ([place]) 09/12/2018   Anxiety    Bipolar 1 disorder ([place])    Depression    Diabetes mellitus without complication ([place])    Diarrhea 11/21/2020   Gallstones    Hyperlipidemia    Hypertension    MDD (major depressive disorder), recurrent episode, moderate ([place]) 11/18/2020   Neuropathy    Schizophrenia ([place])    Seizures (HCC)    X1- febrile seizure as a child-none since   Suicide ideation 09/12/2018    Past Surgical History:  Procedure Laterality Date   CESAREAN SECTION  05/1998   X 1   CHOLECYSTECTOMY N/A 03/27/2016    Procedure: LAPAROSCOPIC CHOLECYSTECTOMY;  Surgeon: Olean Ree, MD;  Location: [facility] ORS;  Service: General;  Laterality: N/A;   TONSILLECTOMY AND ADENOIDECTOMY     age 62   TUBAL LIGATION      Family History  Problem Relation Age of Onset   Diabetes Mother    Heart disease Mother    Kidney disease Mother    Stroke Mother    Hyperlipidemia Mother    Diabetes Maternal Aunt     Social History   Socioeconomic History   Marital status: Single    Spouse name: Not on file   Number of children: Not on file   Years of education: Not on file   Highest education level: Not on file  Occupational History   Not on file  Tobacco Use   Smoking status: Former    Types: Cigarettes    Quit date: 03/14/2013    Years since quitting: 8.9   Smokeless tobacco: Never   Tobacco comments:    1 cigarette1-2 times year, only when she gets stressed  Vaping Use   Vaping Use: Never used  Substance and Sexual Activity   Alcohol use: Not Currently    Alcohol/week: 1.0 standard drink of alcohol    Types: 1 Cans of beer per week    Comment: One can of beer every 2-3 months.    Drug use: No   Sexual activity: Not Currently  Other Topics Concern   Not  on file  Social History Narrative   ** Merged History Encounter **       Social Determinants of Health   Financial Resource Strain: Not on file  Food Insecurity: No Food Insecurity (02/07/2022)   Hunger Vital Sign    Worried About Running Out of Food in the Last Year: Never true    Ran Out of Food in the Last Year: Never true  Transportation Needs: Unmet Transportation Needs (02/07/2022)   PRAPARE - Hydrologist (Medical): Yes    Lack of Transportation (Non-Medical): Yes  Physical Activity: Not on file  Stress: Not on file  Social Connections: Not on file    Allergies  Allergen Reactions   Lisinopril Other (See Comments)    Patient does not wish to take this medication (she heard that it can make your kidneys  worse)   Amoxicillin Rash    Outpatient Medications Prior to Visit  Medication Sig Dispense Refill   amLODipine (NORVASC) 10 MG tablet Take 1 tablet (10 mg total) by mouth daily. 30 tablet 0   fluticasone (FLONASE) 50 MCG/ACT nasal spray Place 1 spray into both nostrils daily. 16 g 0   QUEtiapine (SEROQUEL) 50 MG tablet Take 1 tablet (50 mg total) by mouth at bedtime. 30 tablet 0   No facility-administered medications prior to visit.     ROS Review of Systems  Constitutional:  Negative for activity change and appetite change.  HENT:  Negative for sinus pressure and sore throat.   Respiratory:  Negative for chest tightness, shortness of breath and wheezing.   Cardiovascular:  Negative for chest pain and palpitations.  Gastrointestinal:  Negative for abdominal distention, abdominal pain and constipation.  Genitourinary: Negative.   Musculoskeletal: Negative.   Psychiatric/Behavioral:  Negative for behavioral problems and dysphoric mood.     Objective:  BP (!) 236/82   Pulse 70   Ht 5\' 2"  (1.575 m)   Wt 140 lb 9.6 oz (63.8 kg)   SpO2 100%   BMI 25.72 kg/m      02/25/2022   10:06 AM 02/13/2022    4:44 PM 02/13/2022   11:37 AM  BP/Weight  Systolic BP 045 409 811  Diastolic BP 82 89 59  Wt. (Lbs) 140.6    BMI 25.72 kg/m2        Physical Exam Constitutional:      Appearance: She is well-developed.  Cardiovascular:     Rate and Rhythm: Normal rate.     Heart sounds: Normal heart sounds. No murmur heard. Pulmonary:     Effort: Pulmonary effort is normal.     Breath sounds: Normal breath sounds. No wheezing or rales.  Chest:     Chest wall: No tenderness.  Abdominal:     General: Bowel sounds are normal. There is no distension.     Palpations: Abdomen is soft. There is no mass.     Tenderness: There is no abdominal tenderness.  Musculoskeletal:        General: Normal range of motion.     Right lower leg: No edema.     Left lower leg: No edema.  Neurological:      Mental Status: She is alert and oriented to person, place, and time.  Psychiatric:        Mood and Affect: Mood normal.    Diabetic Foot Exam - Simple   Simple Foot Form Diabetic Foot exam was performed with the following findings: Yes 02/25/2022 10:54 AM  Visual  Inspection See comments: Yes Sensation Testing Intact to touch and monofilament testing bilaterally: Yes Pulse Check Posterior Tibialis and Dorsalis pulse intact bilaterally: Yes Comments Right second toe dystrophic thick nail.  No ulceration or skin breakdown.        Latest Ref Rng & Units 02/13/2022   12:17 AM 02/12/2022   12:31 AM 02/10/2022   12:31 AM  CMP  Glucose 70 - 99 mg/dL 162  196  162   BUN 8 - 23 mg/dL 55  56  59   Creatinine 0.44 - 1.00 mg/dL 5.49  5.42  5.63   Sodium 135 - 145 mmol/L 138  137  139   Potassium 3.5 - 5.1 mmol/L 4.7  4.4  4.5   Chloride 98 - 111 mmol/L 102  102  106   CO2 22 - 32 mmol/L 24  24  22    Calcium 8.9 - 10.3 mg/dL 8.8  8.5  8.4     Lipid Panel     Component Value Date/Time   CHOL 197 11/18/2020 1825   CHOL 235 (H) 01/13/2019 1507   TRIG 144 11/18/2020 1825   HDL 49 11/18/2020 1825   HDL 61 01/13/2019 1507   CHOLHDL 4.0 11/18/2020 1825   VLDL 29 11/18/2020 1825   LDLCALC 119 (H) 11/18/2020 1825   LDLCALC 158 (H) 01/13/2019 1507    CBC    Component Value Date/Time   WBC 6.3 02/11/2022 1020   RBC 2.63 (L) 02/11/2022 1020   HGB 8.0 (L) 02/11/2022 1020   HGB 10.1 (L) 10/21/2019 1531   HCT 24.2 (L) 02/11/2022 1020   HCT 30.5 (L) 10/21/2019 1531   PLT 237 02/11/2022 1020   PLT 270 10/21/2019 1531   MCV 92.0 02/11/2022 1020   MCV 89 10/21/2019 1531   MCH 30.4 02/11/2022 1020   MCHC 33.1 02/11/2022 1020   RDW 13.0 02/11/2022 1020   RDW 13.2 10/21/2019 1531   LYMPHSABS 1.2 02/06/2022 1846   LYMPHSABS 2.1 10/21/2019 1531   MONOABS 1.0 02/06/2022 1846   EOSABS 0.2 02/06/2022 1846   EOSABS 0.2 10/21/2019 1531   BASOSABS 0.0 02/06/2022 1846   BASOSABS 0.0 10/21/2019  1531    Lab Results  Component Value Date   HGBA1C 6.7 (H) 02/07/2022    Assessment & Plan:  1. Accelerated hypertension Accelerated blood pressure of 236/82 despite taking amlodipine 10 mg today Clonidine 0.1 mg administered and repeat blood pressures 182/83 Will add carvedilol to regimen and reassess blood pressure at next visit - cloNIDine (CATAPRES) tablet 0.1 mg  2. Homelessness Difficult living situation Seen by case manager and social worker today and resources provided  3. Hx of schizophrenia Uncontrolled Currently on Seroquel which is inadequate She needs optimization of regimen - Ambulatory referral to Psychiatry  4. Hypertension associated with stage 5 chronic kidney disease due to type 2 diabetes mellitus ([place]) Uncontrolled blood pressure Coreg added, continue amlodipine Hypertensive and diabetic nephropathy Encouraged to call [facility] for an appointment to discuss possible renal replacement therapy and time line - carvedilol (COREG) 6.25 MG tablet; Take 1 tablet (6.25 mg total) by mouth 2 (two) times daily with a meal.  Dispense: 60 tablet; Refill: 3  5. Type 2 diabetes mellitus with other specified complication, without long-term current use of insulin (HCC) Controlled with A1c of 6.7 - Ambulatory referral to Podiatry  6. Dystrophic nail - Ambulatory referral to Podiatry    Meds ordered this encounter  Medications   carvedilol (COREG) 6.25 MG tablet  Sig: Take 1 tablet (6.25 mg total) by mouth 2 (two) times daily with a meal.    Dispense:  60 tablet    Refill:  3   cloNIDine (CATAPRES) tablet 0.1 mg    Follow-up: Return in about 1 month (around 03/28/2022) for Blood Pressure follow-up.       [name], MD, FAAFP. [facility], [place]   02/25/2022, 11:40 AM

## 2022-02-25 NOTE — Patient Instructions (Signed)
Managing Your Hypertension Hypertension, also called high blood pressure, is when the force of the blood pressing against the walls of the arteries is too strong. Arteries are blood vessels that carry blood from your heart throughout your body. Hypertension forces the heart to work harder to pump blood and may cause the arteries to become narrow or stiff. Understanding blood pressure readings A blood pressure reading includes a higher number over a lower number: The first, or top, number is called the systolic pressure. It is a measure of the pressure in your arteries as your heart beats. The second, or bottom number, is called the diastolic pressure. It is a measure of the pressure in your arteries as the heart relaxes. For most people, a normal blood pressure is below 120/80. Your personal target blood pressure may vary depending on your medical conditions, your age, and other factors. Blood pressure is classified into four stages. Based on your blood pressure reading, your health care provider may use the following stages to determine what type of treatment you need, if any. Systolic pressure and diastolic pressure are measured in a unit called millimeters of mercury (mmHg). Normal Systolic pressure: below 120. Diastolic pressure: below 80. Elevated Systolic pressure: 120-129. Diastolic pressure: below 80. Hypertension stage 1 Systolic pressure: 130-139. Diastolic pressure: 80-89. Hypertension stage 2 Systolic pressure: 140 or above. Diastolic pressure: 90 or above. How can this condition affect me? Managing your hypertension is very important. Over time, hypertension can damage the arteries and decrease blood flow to parts of the body, including the brain, heart, and kidneys. Having untreated or uncontrolled hypertension can lead to: A heart attack. A stroke. A weakened blood vessel (aneurysm). Heart failure. Kidney damage. Eye damage. Memory and concentration problems. Vascular  dementia. What actions can I take to manage this condition? Hypertension can be managed by making lifestyle changes and possibly by taking medicines. Your health care provider will help you make a plan to bring your blood pressure within a normal range. You may be referred for counseling on a healthy diet and physical activity. Nutrition  Eat a diet that is high in fiber and potassium, and low in salt (sodium), added sugar, and fat. An example eating plan is called the DASH diet. DASH stands for Dietary Approaches to Stop Hypertension. To eat this way: Eat plenty of fresh fruits and vegetables. Try to fill one-half of your plate at each meal with fruits and vegetables. Eat whole grains, such as whole-wheat pasta, brown rice, or whole-grain bread. Fill about one-fourth of your plate with whole grains. Eat low-fat dairy products. Avoid fatty cuts of meat, processed or cured meats, and poultry with skin. Fill about one-fourth of your plate with lean proteins such as fish, chicken without skin, beans, eggs, and tofu. Avoid pre-made and processed foods. These tend to be higher in sodium, added sugar, and fat. Reduce your daily sodium intake. Many people with hypertension should eat less than 1,500 mg of sodium a day. Lifestyle  Work with your health care provider to maintain a healthy body weight or to lose weight. Ask what an ideal weight is for you. Get at least 30 minutes of exercise that causes your heart to beat faster (aerobic exercise) most days of the week. Activities may include walking, swimming, or biking. Include exercise to strengthen your muscles (resistance exercise), such as weight lifting, as part of your weekly exercise routine. Try to do these types of exercises for 30 minutes at least 3 days a week. Do   not use any products that contain nicotine or tobacco. These products include cigarettes, chewing tobacco, and vaping devices, such as e-cigarettes. If you need help quitting, ask your  health care provider. Control any long-term (chronic) conditions you have, such as high cholesterol or diabetes. Identify your sources of stress and find ways to manage stress. This may include meditation, deep breathing, or making time for fun activities. Alcohol use Do not drink alcohol if: Your health care provider tells you not to drink. You are pregnant, may be pregnant, or are planning to become pregnant. If you drink alcohol: Limit how much you have to: 0-1 drink a day for women. 0-2 drinks a day for men. Know how much alcohol is in your drink. In the [place] one drink equals one 12 oz bottle of beer (355 mL), one 5 oz glass of wine (148 mL), or one 1 oz glass of hard liquor (44 mL). Medicines Your health care provider may prescribe medicine if lifestyle changes are not enough to get your blood pressure under control and if: Your systolic blood pressure is 130 or higher. Your diastolic blood pressure is 80 or higher. Take medicines only as told by your health care provider. Follow the directions carefully. Blood pressure medicines must be taken as told by your health care provider. The medicine does not work as well when you skip doses. Skipping doses also puts you at risk for problems. Monitoring Before you monitor your blood pressure: Do not smoke, drink caffeinated beverages, or exercise within 30 minutes before taking a measurement. Use the bathroom and empty your bladder (urinate). Sit quietly for at least 5 minutes before taking measurements. Monitor your blood pressure at home as told by your health care provider. To do this: Sit with your back straight and supported. Place your feet flat on the floor. Do not cross your legs. Support your arm on a flat surface, such as a table. Make sure your upper arm is at heart level. Each time you measure, take two or three readings one minute apart and record the results. You may also need to have your blood pressure checked regularly by  your health care provider. General information Talk with your health care provider about your diet, exercise habits, and other lifestyle factors that may be contributing to hypertension. Review all the medicines you take with your health care provider because there may be side effects or interactions. Keep all follow-up visits. Your health care provider can help you create and adjust your plan for managing your high blood pressure. Where to find more information National Heart, Lung, and Blood Institute: www.nhlbi.nih.gov American Heart Association: www.heart.org Contact a health care provider if: You think you are having a reaction to medicines you have taken. You have repeated (recurrent) headaches. You feel dizzy. You have swelling in your ankles. You have trouble with your vision. Get help right away if: You develop a severe headache or confusion. You have unusual weakness or numbness, or you feel faint. You have severe pain in your chest or abdomen. You vomit repeatedly. You have trouble breathing. These symptoms may be an emergency. Get help right away. Call 911. Do not wait to see if the symptoms will go away. Do not drive yourself to the hospital. Summary Hypertension is when the force of blood pumping through your arteries is too strong. If this condition is not controlled, it may put you at risk for serious complications. Your personal target blood pressure may vary depending on your medical conditions,   your age, and other factors. For most people, a normal blood pressure is less than 120/80. Hypertension is managed by lifestyle changes, medicines, or both. Lifestyle changes to help manage hypertension include losing weight, eating a healthy, low-sodium diet, exercising more, stopping smoking, and limiting alcohol. This information is not intended to replace advice given to you by your health care provider. Make sure you discuss any questions you have with your health care  provider. Document Revised: 10/12/2020 Document Reviewed: 10/12/2020 Elsevier Patient Education  2023 Elsevier Inc.  
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
## 2022-02-26 NOTE — Progress Notes (Signed)
Patient's daughter has arranged Melburn Popper ride for her today to and from her clinic appointment

## 2022-02-26 NOTE — Progress Notes (Signed)
Patient lost her home and has been living in a friend's car.  She has an appointment at the [facility] on 02/27/2022 to meet with a caseworker to discuss her need for housing.

## 2022-02-26 NOTE — Progress Notes (Signed)
Patient was given food from the [facility] food pantry.

## 2022-02-26 NOTE — Progress Notes (Signed)
She has not completed an application for disability.  I made a referral to [facility] for assistance with submitting the application.

## 2022-03-02 ENCOUNTER — Emergency Department (HOSPITAL_COMMUNITY)
Admission: EM | Admit: 2022-03-02 | Discharge: 2022-03-02 | Disposition: A | Discharge status: Home / Self Care | Payer: Medicaid Other | Attending: Emergency Medicine | Admitting: Emergency Medicine

## 2022-03-02 ENCOUNTER — Other Ambulatory Visit: Payer: Self-pay

## 2022-03-02 DIAGNOSIS — R197 Diarrhea, unspecified: Secondary | ICD-10-CM | POA: Diagnosis present

## 2022-03-02 DIAGNOSIS — Z79899 Other long term (current) drug therapy: Secondary | ICD-10-CM | POA: Diagnosis not present

## 2022-03-02 DIAGNOSIS — E1122 Type 2 diabetes mellitus with diabetic chronic kidney disease: Secondary | ICD-10-CM | POA: Diagnosis not present

## 2022-03-02 DIAGNOSIS — N189 Chronic kidney disease, unspecified: Secondary | ICD-10-CM | POA: Insufficient documentation

## 2022-03-02 DIAGNOSIS — I130 Hypertensive heart and chronic kidney disease with heart failure and stage 1 through stage 4 chronic kidney disease, or unspecified chronic kidney disease: Secondary | ICD-10-CM | POA: Diagnosis not present

## 2022-03-02 DIAGNOSIS — I509 Heart failure, unspecified: Secondary | ICD-10-CM | POA: Insufficient documentation

## 2022-03-02 LAB — URINALYSIS, ROUTINE W REFLEX MICROSCOPIC
Bilirubin Urine: NEGATIVE
Glucose, UA: NEGATIVE mg/dL
Hgb urine dipstick: NEGATIVE
Ketones, ur: NEGATIVE mg/dL
Leukocytes,Ua: NEGATIVE
Nitrite: NEGATIVE
Protein, ur: >=300 mg/dL — AB
Specific Gravity, Urine: 1.012 (ref 1.005–1.030)
pH: 5 (ref 5.0–8.0)

## 2022-03-02 LAB — COMPREHENSIVE METABOLIC PANEL
ALT: 17 U/L (ref 0–44)
AST: 18 U/L (ref 15–41)
Albumin: 3.5 g/dL (ref 3.5–5.0)
Alkaline Phosphatase: 70 U/L (ref 38–126)
Anion gap: 11 (ref 5–15)
BUN: 66 mg/dL — ABNORMAL HIGH (ref 8–23)
CO2: 21 mmol/L — ABNORMAL LOW (ref 22–32)
Calcium: 8.8 mg/dL — ABNORMAL LOW (ref 8.9–10.3)
Chloride: 104 mmol/L (ref 98–111)
Creatinine, Ser: 5.56 mg/dL — ABNORMAL HIGH (ref 0.44–1.00)
GFR, Estimated: 8 mL/min — ABNORMAL LOW (ref >60)
Glucose, Bld: 150 mg/dL — ABNORMAL HIGH (ref 70–99)
Potassium: 3.8 mmol/L (ref 3.5–5.1)
Sodium: 136 mmol/L (ref 135–145)
Total Bilirubin: 0.2 mg/dL — ABNORMAL LOW (ref 0.3–1.2)
Total Protein: 7 g/dL (ref 6.5–8.1)

## 2022-03-02 LAB — LIPASE, BLOOD: Lipase: 32 U/L (ref 11–51)

## 2022-03-02 LAB — CBC
HCT: 28.7 % — ABNORMAL LOW (ref 36.0–46.0)
Hemoglobin: 9.7 g/dL — ABNORMAL LOW (ref 12.0–15.0)
MCH: 30.5 pg (ref 26.0–34.0)
MCHC: 33.8 g/dL (ref 30.0–36.0)
MCV: 90.3 fL (ref 80.0–100.0)
Platelets: 245 10*3/uL (ref 150–400)
RBC: 3.18 MIL/uL — ABNORMAL LOW (ref 3.87–5.11)
RDW: 12.4 % (ref 11.5–15.5)
WBC: 4.8 10*3/uL (ref 4.0–10.5)
nRBC: 0 % (ref 0.0–0.2)

## 2022-03-02 MED ORDER — LOPERAMIDE HCL 2 MG PO CAPS
2.0000 mg | ORAL_CAPSULE | Freq: Once | ORAL | Status: AC
  Administered 2022-03-02: 2 mg via ORAL
  Filled 2022-03-02: qty 1

## 2022-03-02 NOTE — ED Triage Notes (Signed)
Patient arrived with EMS reports intermittent emesis with diarrhea for several days , she suspects lactose intolerance .

## 2022-03-02 NOTE — ED Provider Triage Note (Signed)
Emergency Medicine Provider Triage Evaluation Note  Lorraine Turner , a 62 y.o. female  was evaluated in triage.  Pt complains of v/d onset Tuesday, exposed to family members with same, lasting 1-2 days. Ate broccoli with cheese on it and then had bad diarrhea. Concern for lactose intolerance, lasted a few days. Today symptoms improved took BP meds and diarrhea returned.   Review of Systems  Positive: As above Negative: As above, abdominal pain, fevers, chills  Physical Exam  BP (!) 179/63   Pulse (!) 57   Temp 98.4 F (36.9 C)   Resp 18   SpO2 100%  Gen:   Awake, no distress   Resp:  Normal effort  MSK:   Moves extremities without difficulty  Other:  Abd soft and non tender  Medical Decision Making  Medically screening exam initiated at 6:44 AM.  Appropriate orders placed.  CAASI GIGLIA was informed that the remainder of the evaluation will be completed by another provider, this initial triage assessment does not replace that evaluation, and the importance of remaining in the ED until their evaluation is complete.     Tacy Learn, PA-C 03/02/22 [id number]

## 2022-03-02 NOTE — ED Provider Notes (Signed)
[facility] Provider Note   CSN: [id number] Arrival date & time: 03/02/22  6761     History  Chief Complaint  Patient presents with   Emesis / Diarrhea    Lorraine Turner is a 62 y.o. female.  Pt complains of v/d onset Tuesday, exposed to family members with same, lasting 1-2 days. Ate broccoli with cheese on it and then had bad diarrhea. Concern for lactose intolerance, lasted a few days. Today symptoms improved took BP meds and diarrhea returned (non bloody, loose stools). History of CKD, sees nephrology, has voided twice today.       Home Medications Prior to Admission medications   Medication Sig Start Date End Date Taking? Authorizing Provider  amLODipine (NORVASC) 10 MG tablet Take 1 tablet (10 mg total) by mouth daily. 02/14/22   Mapp, Claudia Desanctis, MD  carvedilol (COREG) 6.25 MG tablet Take 1 tablet (6.25 mg total) by mouth 2 (two) times daily with a meal. 02/25/22   Newlin, Charlane Ferretti, MD  fluticasone (FLONASE) 50 MCG/ACT nasal spray Place 1 spray into both nostrils daily. 10/16/21 10/16/22  Lacinda Axon, MD  QUEtiapine (SEROQUEL) 50 MG tablet Take 1 tablet (50 mg total) by mouth at bedtime. 02/13/22   Mapp, Claudia Desanctis, MD      Allergies    Lisinopril and Amoxicillin    Review of Systems   Review of Systems Negative except as per HPI Physical Exam Updated Vital Signs BP (!) 192/74 (BP Location: Left Arm)   Pulse 62   Temp 98.2 F (36.8 C) (Oral)   Resp 14   SpO2 100%  Physical Exam Vitals and nursing note reviewed.  Constitutional:      General: She is not in acute distress.    Appearance: She is well-developed. She is not diaphoretic.  HENT:     Head: Normocephalic and atraumatic.  Cardiovascular:     Rate and Rhythm: Normal rate and regular rhythm.     Heart sounds: Normal heart sounds.  Pulmonary:     Effort: Pulmonary effort is normal.     Breath sounds: Normal breath sounds.  Abdominal:     Palpations: Abdomen  is soft.     Tenderness: There is no abdominal tenderness.  Skin:    General: Skin is warm and dry.     Findings: No erythema or rash.  Neurological:     Mental Status: She is alert and oriented to person, place, and time.  Psychiatric:        Behavior: Behavior normal.     ED Results / Procedures / Treatments   Labs (all labs ordered are listed, but only abnormal results are displayed) Labs Reviewed  COMPREHENSIVE METABOLIC PANEL - Abnormal; Notable for the following components:      Result Value   CO2 21 (*)    Glucose, Bld 150 (*)    BUN 66 (*)    Creatinine, Ser 5.56 (*)    Calcium 8.8 (*)    Total Bilirubin 0.2 (*)    GFR, Estimated 8 (*)    All other components within normal limits  CBC - Abnormal; Notable for the following components:   RBC 3.18 (*)    Hemoglobin 9.7 (*)    HCT 28.7 (*)    All other components within normal limits  C DIFFICILE QUICK SCREEN W PCR REFLEX    LIPASE, BLOOD  URINALYSIS, ROUTINE W REFLEX MICROSCOPIC    EKG None  Radiology No results found.  Procedures Procedures    Medications Ordered in ED Medications  loperamide (IMODIUM) capsule 2 mg (2 mg Oral Given 03/02/22 1205)    ED Course/ Medical Decision Making/ A&P                             Medical Decision Making Amount and/or Complexity of Data Reviewed Labs: ordered.  Risk Prescription drug management.   This patient presents to the ED for concern of diarrhea, this involves an extensive number of treatment options, and is a complaint that carries with it a high risk of complications and morbidity.  The differential diagnosis includes but not limited to viral illness, dehydration, colitis,    Co morbidities that complicate the patient evaluation  HTN, DM, CKD, CHF, additional history reviewed in chart   Additional history obtained:  External records from outside source obtained and reviewed including prior labs on file for comparison    Lab Tests:  I  Ordered, and personally interpreted labs.  The pertinent results include:  CBC with hgb 9.7, on trend with prior.  CMP with baseline Cr at 5.56, normal K. Unable to provide stool sample for c. diff   Problem List / ED Course / Critical interventions / Medication management  62 year old female with complaint of diarrhea as above. Abd soft and non tender, no abdominal pain complaints. Afebrile, labs without significant changes. Unable to provide stool sample after 6 hours for c  diff testing. Advised to follow up with PCP for recheck, can test if still having symptoms at that time.  I ordered medication including imodium  for diarrhea  Reevaluation of the patient after these medicines showed that the patient  administered at time of dc, unable to provide stool sample while in the ER I have reviewed the patients home medicines and have made adjustments as needed   Social Determinants of Health:  Has PCP and specialty care for follow up   Test / Admission - Considered:  Labs without sign changes, no further diarrhea, stable for dc to follow up with PCP         Final Clinical Impression(s) / ED Diagnoses Final diagnoses:  Diarrhea, unspecified type    Rx / DC Orders ED Discharge Orders     None         Tacy Learn, PA-C 03/02/22 1300    Wyvonnia Dusky, MD 03/02/22 1336

## 2022-03-02 NOTE — Discharge Instructions (Signed)
Try bland diet to help relieve diarrhea. Stay hydrated with low sugar Gatorade.  Follow up with your doctor for recheck, if you are still having diarrhea they can send a sample if needed. Can try pepto bismal or imodium.

## 2022-03-04 NOTE — Telephone Encounter (Signed)
LCSWA met with pt last week, introduced herself and evaluated her immediate needs. LCSWA was able to provide her with food from our food pantry and coping skills to help her meditate and reduce her anxiety.

## 2022-03-11 ENCOUNTER — Inpatient Hospital Stay: Payer: Medicaid Other | Admitting: Family Medicine

## 2022-04-02 ENCOUNTER — Ambulatory Visit (INDEPENDENT_AMBULATORY_CARE_PROVIDER_SITE_OTHER): Payer: Medicaid Other | Admitting: Podiatry

## 2022-04-02 DIAGNOSIS — Z79899 Other long term (current) drug therapy: Secondary | ICD-10-CM | POA: Diagnosis not present

## 2022-04-02 NOTE — Progress Notes (Signed)
  Subjective:  Patient ID: Lorraine Turner, female    DOB: 1960/04/16,  MRN: [id number]  Chief Complaint  Patient presents with   Nail Problem    Possible nail fungus    62 y.o. female returns for the above complaint.  Patient presents with thickened elongated dystrophic mycotic toenails x 10 she states is painful to touch.  She states they are very thick.  She would also like to discuss treatment options for nail fungus.  She has not seen anyone as prior to seeing me.  Treated at [facility]  Objective:  There were no vitals filed for this visit. Podiatric Exam: Vascular: dorsalis pedis and posterior tibial pulses are palpable bilateral. Capillary return is immediate. Temperature gradient is WNL. Skin turgor WNL  Sensorium: Normal Semmes Weinstein monofilament test. Normal tactile sensation bilaterally. Nail Exam: Pt has thick disfigured discolored nails with subungual debris noted bilateral entire nail hallux through fifth toenails.  Pain on palpation to the nails. Ulcer Exam: There is no evidence of ulcer or pre-ulcerative changes or infection. Orthopedic Exam: Muscle tone and strength are WNL. No limitations in general ROM. No crepitus or effusions noted.  Skin: No Porokeratosis. No infection or ulcers    Assessment & Plan:  No diagnosis found.  Patient was evaluated and treated and all questions answered.  Onychomycosis with pain  -Nails palliatively debrided as below. -Educated on self-care -Educated the patient on the etiology of onychomycosis and various treatment options associated with improving the fungal load.  I explained to the patient that there is 3 treatment options available to treat the onychomycosis including topical, p.o., laser treatment.  Patient elected to undergo p.o. options with Lamisil/terbinafine therapy.  In order for me to start the medication therapy, I explained to the patient the importance of evaluating the liver and obtaining the liver function test.   Once the liver function test comes back normal I will start him on 53-monthcourse of Lamisil therapy.  Patient understood all risk and would like to proceed with Lamisil therapy.  I have asked the patient to immediately stop the Lamisil therapy if she has any reactions to it and call the office or go to the emergency room right away.  Patient states understanding   Procedure: Nail Debridement Rationale: pain  Type of Debridement: manual, sharp debridement. Instrumentation: Nail nipper, rotary burr. Number of Nails: 10  Procedures and Treatment: Consent by patient was obtained for treatment procedures. The patient understood the discussion of treatment and procedures well. All questions were answered thoroughly reviewed. Debridement of mycotic and hypertrophic toenails, 1 through 5 bilateral and clearing of subungual debris. No ulceration, no infection noted.  Return Visit-Office Procedure: Patient instructed to return to the office for a follow up visit 3 months for continued evaluation and treatment.  KBoneta Lucks DPM    No follow-ups on file.

## 2022-04-03 ENCOUNTER — Telehealth: Payer: Self-pay

## 2022-04-03 ENCOUNTER — Encounter: Payer: Self-pay | Admitting: Family Medicine

## 2022-04-03 ENCOUNTER — Other Ambulatory Visit: Payer: Self-pay

## 2022-04-03 ENCOUNTER — Ambulatory Visit: Payer: Medicaid Other | Attending: Family Medicine | Admitting: Family Medicine

## 2022-04-03 VITALS — BP 189/84 | HR 71 | Temp 98.6°F | Ht 62.0 in | Wt 140.2 lb

## 2022-04-03 DIAGNOSIS — E1140 Type 2 diabetes mellitus with diabetic neuropathy, unspecified: Secondary | ICD-10-CM | POA: Insufficient documentation

## 2022-04-03 DIAGNOSIS — I120 Hypertensive chronic kidney disease with stage 5 chronic kidney disease or end stage renal disease: Secondary | ICD-10-CM | POA: Diagnosis not present

## 2022-04-03 DIAGNOSIS — N185 Chronic kidney disease, stage 5: Secondary | ICD-10-CM | POA: Insufficient documentation

## 2022-04-03 DIAGNOSIS — F333 Major depressive disorder, recurrent, severe with psychotic symptoms: Secondary | ICD-10-CM | POA: Diagnosis not present

## 2022-04-03 DIAGNOSIS — F259 Schizoaffective disorder, unspecified: Secondary | ICD-10-CM | POA: Diagnosis not present

## 2022-04-03 DIAGNOSIS — E1122 Type 2 diabetes mellitus with diabetic chronic kidney disease: Secondary | ICD-10-CM | POA: Insufficient documentation

## 2022-04-03 DIAGNOSIS — Z79899 Other long term (current) drug therapy: Secondary | ICD-10-CM | POA: Insufficient documentation

## 2022-04-03 MED ORDER — AMLODIPINE BESYLATE 10 MG PO TABS
10.0000 mg | ORAL_TABLET | Freq: Every day | ORAL | 1 refills | Status: DC
  Filled 2022-04-03: qty 90, 90d supply, fill #0

## 2022-04-03 MED ORDER — VALSARTAN 320 MG PO TABS
320.0000 mg | ORAL_TABLET | Freq: Every day | ORAL | 1 refills | Status: DC
  Filled 2022-04-03: qty 90, 90d supply, fill #0

## 2022-04-03 NOTE — Patient Instructions (Signed)
Low-Sodium Eating Plan Sodium, which is an element that makes up salt, helps you maintain a healthy balance of fluids in your body. Too much sodium can increase your blood pressure and cause fluid and waste to be held in your body. Your health care provider or dietitian may recommend following this plan if you have high blood pressure (hypertension), kidney disease, liver disease, or heart failure. Eating less sodium can help lower your blood pressure, reduce swelling, and protect your heart, liver, and kidneys. What are tips for following this plan? Reading food labels The Nutrition Facts label lists the amount of sodium in one serving of the food. If you eat more than one serving, you must multiply the listed amount of sodium by the number of servings. Choose foods with less than 140 mg of sodium per serving. Avoid foods with 300 mg of sodium or more per serving. Shopping  Look for lower-sodium products, often labeled as "low-sodium" or "no salt added." Always check the sodium content, even if foods are labeled as "unsalted" or "no salt added." Buy fresh foods. Avoid canned foods and pre-made or frozen meals. Avoid canned, cured, or processed meats. Buy breads that have less than 80 mg of sodium per slice. Cooking  Eat more home-cooked food and less restaurant, buffet, and fast food. Avoid adding salt when cooking. Use salt-free seasonings or herbs instead of table salt or sea salt. Check with your health care provider or pharmacist before using salt substitutes. Cook with plant-based oils, such as canola, sunflower, or olive oil. Meal planning When eating at a restaurant, ask that your food be prepared with less salt or no salt, if possible. Avoid dishes labeled as brined, pickled, cured, smoked, or made with soy sauce, miso, or teriyaki sauce. Avoid foods that contain MSG (monosodium glutamate). MSG is sometimes added to Chinese food, bouillon, and some canned foods. Make meals that can  be grilled, baked, poached, roasted, or steamed. These are generally made with less sodium. General information Most people on this plan should limit their sodium intake to 1,500-2,000 mg (milligrams) of sodium each day. What foods should I eat? Fruits Fresh, frozen, or canned fruit. Fruit juice. Vegetables Fresh or frozen vegetables. "No salt added" canned vegetables. "No salt added" tomato sauce and paste. Low-sodium or reduced-sodium tomato and vegetable juice. Grains Low-sodium cereals, including oats, puffed wheat and rice, and shredded wheat. Low-sodium crackers. Unsalted rice. Unsalted pasta. Low-sodium bread. Whole-grain breads and whole-grain pasta. Meats and other proteins Fresh or frozen (no salt added) meat, poultry, seafood, and fish. Low-sodium canned tuna and salmon. Unsalted nuts. Dried peas, beans, and lentils without added salt. Unsalted canned beans. Eggs. Unsalted nut butters. Dairy Milk. Soy milk. Cheese that is naturally low in sodium, such as ricotta cheese, fresh mozzarella, or Swiss cheese. Low-sodium or reduced-sodium cheese. Cream cheese. Yogurt. Seasonings and condiments Fresh and dried herbs and spices. Salt-free seasonings. Low-sodium mustard and ketchup. Sodium-free salad dressing. Sodium-free light mayonnaise. Fresh or refrigerated horseradish. Lemon juice. Vinegar. Other foods Homemade, reduced-sodium, or low-sodium soups. Unsalted popcorn and pretzels. Low-salt or salt-free chips. The items listed above may not be a complete list of foods and beverages you can eat. Contact a dietitian for more information. What foods should I avoid? Vegetables Sauerkraut, pickled vegetables, and relishes. Olives. French fries. Onion rings. Regular canned vegetables (not low-sodium or reduced-sodium). Regular canned tomato sauce and paste (not low-sodium or reduced-sodium). Regular tomato and vegetable juice (not low-sodium or reduced-sodium). Frozen vegetables in  sauces. Grains   Instant hot cereals. Bread stuffing, pancake, and biscuit mixes. Croutons. Seasoned rice or pasta mixes. Noodle soup cups. Boxed or frozen macaroni and cheese. Regular salted crackers. Self-rising flour. Meats and other proteins Meat or fish that is salted, canned, smoked, spiced, or pickled. Precooked or cured meat, such as sausages or meat loaves. Bacon. Ham. Pepperoni. Hot dogs. Corned beef. Chipped beef. Salt pork. Jerky. Pickled herring. Anchovies and sardines. Regular canned tuna. Salted nuts. Dairy Processed cheese and cheese spreads. Hard cheeses. Cheese curds. Blue cheese. Feta cheese. String cheese. Regular cottage cheese. Buttermilk. Canned milk. Fats and oils Salted butter. Regular margarine. Ghee. Bacon fat. Seasonings and condiments Onion salt, garlic salt, seasoned salt, table salt, and sea salt. Canned and packaged gravies. Worcestershire sauce. Tartar sauce. Barbecue sauce. Teriyaki sauce. Soy sauce, including reduced-sodium. Steak sauce. Fish sauce. Oyster sauce. Cocktail sauce. Horseradish that you find on the shelf. Regular ketchup and mustard. Meat flavorings and tenderizers. Bouillon cubes. Hot sauce. Pre-made or packaged marinades. Pre-made or packaged taco seasonings. Relishes. Regular salad dressings. Salsa. Other foods Salted popcorn and pretzels. Corn chips and puffs. Potato and tortilla chips. Canned or dried soups. Pizza. Frozen entrees and pot pies. The items listed above may not be a complete list of foods and beverages you should avoid. Contact a dietitian for more information. Summary Eating less sodium can help lower your blood pressure, reduce swelling, and protect your heart, liver, and kidneys. Most people on this plan should limit their sodium intake to 1,500-2,000 mg (milligrams) of sodium each day. Canned, boxed, and frozen foods are high in sodium. Restaurant foods, fast foods, and pizza are also very high in sodium. You also get sodium by  adding salt to food. Try to cook at home, eat more fresh fruits and vegetables, and eat less fast food and canned, processed, or prepared foods. This information is not intended to replace advice given to you by your health care provider. Make sure you discuss any questions you have with your health care provider. Document Revised: 03/05/2019 Document Reviewed: 12/30/2018 Elsevier Patient Education  2023 [employer].  

## 2022-04-03 NOTE — Telephone Encounter (Signed)
I met with the patient when she was in the clinic today.  She explained that she is now living in an apartment with her daughter. She would eventually like to get a place of her own. She still has no income and said that she was supposed to meet with a representative from [facility] but she had to cancel that appointment and she said she will call to re-schedule.  A referral had been sent to [facility] to assist her with applying for disability.  She said that in the meantime, she would like to have some type of part time job to help her mind busy. She plans to contact the [employer].

## 2022-04-03 NOTE — Progress Notes (Signed)
States that she is depressed. Stopped Carvedilol due to nausea.

## 2022-04-03 NOTE — Progress Notes (Signed)
Subjective:  Patient ID: Lorraine Turner, female    DOB: [birth year]  Age: 62 y.o. MRN: [id number]  CC: Hypertension   HPI Lorraine Turner is a 62 y.o. year old female with a history of  schizoaffective affective disorder, hypertension, diabetic neuropathy, type 2 diabetes mellitus (A1c 6.7), stage V CKD who presents today for a follow-up visit.    Interval History:  She presents today for follow-up of her blood pressure and her blood pressure is significantly elevated.She Complains of diarrhea with Coreg but endorses adherence with the amlodipine as she is able to tolerate it.  Denies presence of headache or blurry vision.  She saw Dr Posey Pronto yesterday for management of her toenail fungus.  She Complains of being depressed. She has an appointment with [facility] for therapy and medication management.  She attributes depression to underlying psychosocial stressors.  Denies presence of suicidal ideations or intents.  PHQ-9 score is 5 today which is improved compared to previous results.  Remains on Seroquel.  Currently looking for a job.  Past Medical History:  Diagnosis Date   AKI (acute kidney injury) ([place]) 09/12/2018   Anxiety    Bipolar 1 disorder ([place])    Depression    Diabetes mellitus without complication ([place])    Diarrhea 11/21/2020   Gallstones    Hyperlipidemia    Hypertension    MDD (major depressive disorder), recurrent episode, moderate ([place]) 11/18/2020   Neuropathy    Schizophrenia ([place])    Seizures (HCC)    X1- febrile seizure as a child-none since   Suicide ideation 09/12/2018    Past Surgical History:  Procedure Laterality Date   CESAREAN SECTION  05/1998   X 1   CHOLECYSTECTOMY N/A 03/27/2016   Procedure: LAPAROSCOPIC CHOLECYSTECTOMY;  Surgeon: Olean Ree, MD;  Location: [facility] ORS;  Service: General;  Laterality: N/A;   TONSILLECTOMY AND ADENOIDECTOMY     age 34   TUBAL LIGATION      Family History  Problem Relation Age of Onset    Diabetes Mother    Heart disease Mother    Kidney disease Mother    Stroke Mother    Hyperlipidemia Mother    Diabetes Maternal Aunt     Social History   Socioeconomic History   Marital status: Single    Spouse name: Not on file   Number of children: Not on file   Years of education: Not on file   Highest education level: Not on file  Occupational History   Not on file  Tobacco Use   Smoking status: Former    Types: Cigarettes    Quit date: 03/14/2013    Years since quitting: 9.0   Smokeless tobacco: Never   Tobacco comments:    1 cigarette1-2 times year, only when she gets stressed  Vaping Use   Vaping Use: Never used  Substance and Sexual Activity   Alcohol use: Not Currently    Alcohol/week: 1.0 standard drink of alcohol    Types: 1 Cans of beer per week    Comment: One can of beer every 2-3 months.    Drug use: No   Sexual activity: Not Currently  Other Topics Concern   Not on file  Social History Narrative   ** Merged History Encounter **       Social Determinants of Health   Financial Resource Strain: High Risk (02/26/2022)   Overall Financial Resource Strain (CARDIA)    Difficulty of Paying Living Expenses: Very  hard  Food Insecurity: Food Insecurity Present (02/25/2022)   Hunger Vital Sign    Worried About Running Out of Food in the Last Year: Sometimes true    Ran Out of Food in the Last Year: Never true  Transportation Needs: Unmet Transportation Needs (02/25/2022)   PRAPARE - Hydrologist (Medical): Yes    Lack of Transportation (Non-Medical): Yes  Physical Activity: Not on file  Stress: Not on file  Social Connections: Not on file    Allergies  Allergen Reactions   Lisinopril Other (See Comments)    Patient does not wish to take this medication (she heard that it can make your kidneys worse)   Amoxicillin Rash    Outpatient Medications Prior to Visit  Medication Sig Dispense Refill   fluticasone (FLONASE) 50  MCG/ACT nasal spray Place 1 spray into both nostrils daily. 16 g 0   QUEtiapine (SEROQUEL) 50 MG tablet Take 1 tablet (50 mg total) by mouth at bedtime. 30 tablet 0   amLODipine (NORVASC) 10 MG tablet Take 1 tablet (10 mg total) by mouth daily. 30 tablet 0   carvedilol (COREG) 6.25 MG tablet Take 1 tablet (6.25 mg total) by mouth 2 (two) times daily with a meal. (Patient not taking: Reported on 04/03/2022) 60 tablet 3   No facility-administered medications prior to visit.     ROS Review of Systems  Constitutional:  Negative for activity change and appetite change.  HENT:  Negative for sinus pressure and sore throat.   Respiratory:  Negative for chest tightness, shortness of breath and wheezing.   Cardiovascular:  Negative for chest pain and palpitations.  Gastrointestinal:  Negative for abdominal distention, abdominal pain and constipation.  Genitourinary: Negative.   Musculoskeletal: Negative.   Psychiatric/Behavioral:  Negative for behavioral problems and dysphoric mood.     Objective:  BP (!) 189/84   Pulse 71   Temp 98.6 F (37 C) (Oral)   Ht 5' 2"$  (1.575 m)   Wt 140 lb 3.2 oz (63.6 kg)   SpO2 100%   BMI 25.64 kg/m      04/03/2022    2:12 PM 03/02/2022    1:11 PM 03/02/2022   12:00 PM  BP/Weight  Systolic BP [id number]  Diastolic BP 84 75 74  Wt. (Lbs) 140.2    BMI 25.64 kg/m2        Physical Exam Constitutional:      Appearance: She is well-developed.  Cardiovascular:     Rate and Rhythm: Normal rate.     Heart sounds: Normal heart sounds. No murmur heard. Pulmonary:     Effort: Pulmonary effort is normal.     Breath sounds: Normal breath sounds. No wheezing or rales.  Chest:     Chest wall: No tenderness.  Abdominal:     General: Bowel sounds are normal. There is no distension.     Palpations: Abdomen is soft. There is no mass.     Tenderness: There is no abdominal tenderness.  Musculoskeletal:        General: Normal range of motion.     Right lower  leg: No edema.     Left lower leg: No edema.  Neurological:     Mental Status: She is alert and oriented to person, place, and time.  Psychiatric:        Mood and Affect: Mood normal.        Latest Ref Rng & Units 03/02/2022    6:52 AM  02/13/2022   12:17 AM 02/12/2022   12:31 AM  CMP  Glucose 70 - 99 mg/dL 150  162  196   BUN 8 - 23 mg/dL 66  55  56   Creatinine 0.44 - 1.00 mg/dL 5.56  5.49  5.42   Sodium 135 - 145 mmol/L 136  138  137   Potassium 3.5 - 5.1 mmol/L 3.8  4.7  4.4   Chloride 98 - 111 mmol/L 104  102  102   CO2 22 - 32 mmol/L 21  24  24   $ Calcium 8.9 - 10.3 mg/dL 8.8  8.8  8.5   Total Protein 6.5 - 8.1 g/dL 7.0     Total Bilirubin 0.3 - 1.2 mg/dL 0.2     Alkaline Phos 38 - 126 U/L 70     AST 15 - 41 U/L 18     ALT 0 - 44 U/L 17       Lipid Panel     Component Value Date/Time   CHOL 197 11/18/2020 1825   CHOL 235 (H) 01/13/2019 1507   TRIG 144 11/18/2020 1825   HDL 49 11/18/2020 1825   HDL 61 01/13/2019 1507   CHOLHDL 4.0 11/18/2020 1825   VLDL 29 11/18/2020 1825   LDLCALC 119 (H) 11/18/2020 1825   LDLCALC 158 (H) 01/13/2019 1507    CBC    Component Value Date/Time   WBC 4.8 03/02/2022 0652   RBC 3.18 (L) 03/02/2022 0652   HGB 9.7 (L) 03/02/2022 0652   HGB 10.1 (L) 10/21/2019 1531   HCT 28.7 (L) 03/02/2022 0652   HCT 30.5 (L) 10/21/2019 1531   PLT 245 03/02/2022 0652   PLT 270 10/21/2019 1531   MCV 90.3 03/02/2022 0652   MCV 89 10/21/2019 1531   MCH 30.5 03/02/2022 0652   MCHC 33.8 03/02/2022 0652   RDW 12.4 03/02/2022 0652   RDW 13.2 10/21/2019 1531   LYMPHSABS 1.2 02/06/2022 1846   LYMPHSABS 2.1 10/21/2019 1531   MONOABS 1.0 02/06/2022 1846   EOSABS 0.2 02/06/2022 1846   EOSABS 0.2 10/21/2019 1531   BASOSABS 0.0 02/06/2022 1846   BASOSABS 0.0 10/21/2019 1531    Lab Results  Component Value Date   HGBA1C 6.7 (H) 02/07/2022    Assessment & Plan:  1. Hypertension associated with stage 5 chronic kidney disease due to type 2 diabetes  mellitus ([place]) Uncontrolled Unable to tolerate Coreg hence have discontinued it Will initiate valsartan Counseled on blood pressure goal of less than 130/80, low-sodium, DASH diet, medication compliance, 150 minutes of moderate intensity exercise per week. Discussed medication compliance, adverse effects. - amLODipine (NORVASC) 10 MG tablet; Take 1 tablet (10 mg total) by mouth daily.  Dispense: 90 tablet; Refill: 1 - valsartan (DIOVAN) 320 MG tablet; Take 1 tablet (320 mg total) by mouth daily.  Dispense: 90 tablet; Refill: 1  2. Severe episode of recurrent major depressive disorder, with psychotic features ([place]) Uncontrolled Currently on Seroquel Psychosocial stressors contributing to depression PHQ-9 score is 5 LCSW called in for counseling.   Meds ordered this encounter  Medications   amLODipine (NORVASC) 10 MG tablet    Sig: Take 1 tablet (10 mg total) by mouth daily.    Dispense:  90 tablet    Refill:  1   valsartan (DIOVAN) 320 MG tablet    Sig: Take 1 tablet (320 mg total) by mouth daily.    Dispense:  90 tablet    Refill:  1    Discontinue  Carvedilol    Follow-up: Return in about 1 month (around 05/02/2022) for Pap smear.       [name], MD, FAAFP. [facility] [place], [place]   04/03/2022, 5:04 PM

## 2022-04-09 ENCOUNTER — Other Ambulatory Visit: Payer: Self-pay

## 2022-04-10 ENCOUNTER — Other Ambulatory Visit: Payer: Self-pay

## 2022-04-18 LAB — LAB REPORT - SCANNED: EGFR: 8

## 2022-04-24 ENCOUNTER — Other Ambulatory Visit: Payer: Self-pay | Admitting: *Deleted

## 2022-04-24 DIAGNOSIS — N183 Chronic kidney disease, stage 3 unspecified: Secondary | ICD-10-CM

## 2022-04-29 ENCOUNTER — Ambulatory Visit (HOSPITAL_COMMUNITY)
Admission: RE | Admit: 2022-04-29 | Discharge: 2022-04-29 | Disposition: A | Discharge status: Home / Self Care | Payer: Medicaid Other | Source: Ambulatory Visit | Attending: Surgery | Admitting: Surgery

## 2022-04-29 ENCOUNTER — Ambulatory Visit (INDEPENDENT_AMBULATORY_CARE_PROVIDER_SITE_OTHER)
Admission: RE | Admit: 2022-04-29 | Discharge: 2022-04-29 | Disposition: A | Discharge status: Home / Self Care | Payer: Medicaid Other | Source: Ambulatory Visit | Attending: Surgery | Admitting: Surgery

## 2022-04-29 DIAGNOSIS — N183 Chronic kidney disease, stage 3 unspecified: Secondary | ICD-10-CM | POA: Diagnosis not present

## 2022-04-30 ENCOUNTER — Ambulatory Visit (INDEPENDENT_AMBULATORY_CARE_PROVIDER_SITE_OTHER): Payer: Medicaid Other | Admitting: Vascular Surgery

## 2022-04-30 ENCOUNTER — Encounter: Payer: Self-pay | Admitting: Vascular Surgery

## 2022-04-30 VITALS — BP 157/63 | HR 68 | Temp 98.2°F | Resp 20 | Ht 62.0 in | Wt 140.0 lb

## 2022-04-30 DIAGNOSIS — N185 Chronic kidney disease, stage 5: Secondary | ICD-10-CM | POA: Diagnosis not present

## 2022-05-01 NOTE — Progress Notes (Signed)
[facility] [place]  ASSESSMENT / PLAN: Lorraine Turner is a 62 y.o. right handed female in need of permanent dialysis access. I reviewed options for dialysis in detail with the patient, including hemodialysis and peritoneal dialysis. I counseled the patient to ask their nephrologist about their candidacy for renal transplant. I counseled the patient that dialysis access requires surveillance and periodic maintenance. Plan to proceed with left arm arteriovenous graft when her nephrologist is ready to proceed.   CHIEF COMPLAINT: worsening renal function  HISTORY OF PRESENT ILLNESS: Lorraine Turner is a 62 y.o. female referred to clinic for evaluation of worsening renal function.  The patient is a right-handed woman.  She has never required dialysis before.  We reviewed the 2 main modalities of dialysis therapy.  Ultimately, she was interested in hemodialysis.  We reviewed the options for hemodialysis access.  I reviewed her vein mapping results with her.  She does not need dialysis urgently, and her nephrologist would like [place] to wait on arteriovenous graft placement.   Past Medical History:  Diagnosis Date   AKI (acute kidney injury) ([place]) 09/12/2018   Anxiety    Bipolar 1 disorder ([place])    Depression    Diabetes mellitus without complication ([place])    Diarrhea 11/21/2020   Gallstones    Hyperlipidemia    Hypertension    MDD (major depressive disorder), recurrent episode, moderate ([place]) 11/18/2020   Neuropathy    Schizophrenia ([place])    Seizures (HCC)    X1- febrile seizure as a child-none since   Suicide ideation 09/12/2018    Past Surgical History:  Procedure Laterality Date   CESAREAN SECTION  05/1998   X 1   CHOLECYSTECTOMY N/A 03/27/2016   Procedure: LAPAROSCOPIC CHOLECYSTECTOMY;  Surgeon: Olean Ree, MD;  Location: [facility] ORS;  Service: General;  Laterality: N/A;   TONSILLECTOMY AND ADENOIDECTOMY     age 24   TUBAL LIGATION      Family History   Problem Relation Age of Onset   Diabetes Mother    Heart disease Mother    Kidney disease Mother    Stroke Mother    Hyperlipidemia Mother    Diabetes Maternal Aunt     Social History   Socioeconomic History   Marital status: Single    Spouse name: Not on file   Number of children: Not on file   Years of education: Not on file   Highest education level: Not on file  Occupational History   Not on file  Tobacco Use   Smoking status: Former    Types: Cigarettes    Quit date: 03/14/2013    Years since quitting: 9.1   Smokeless tobacco: Never   Tobacco comments:    1 cigarette1-2 times year, only when she gets stressed  Vaping Use   Vaping Use: Never used  Substance and Sexual Activity   Alcohol use: Not Currently    Alcohol/week: 1.0 standard drink of alcohol    Types: 1 Cans of beer per week    Comment: One can of beer every 2-3 months.    Drug use: No   Sexual activity: Not Currently  Other Topics Concern   Not on file  Social History Narrative   ** Merged History Encounter **       Social Determinants of Health   Financial Resource Strain: High Risk (02/26/2022)   Overall Financial Resource Strain (CARDIA)    Difficulty of Paying Living Expenses: Very hard  Food Insecurity:  Food Insecurity Present (02/25/2022)   Hunger Vital Sign    Worried About Running Out of Food in the Last Year: Sometimes true    Ran Out of Food in the Last Year: Never true  Transportation Needs: Unmet Transportation Needs (02/25/2022)   PRAPARE - Hydrologist (Medical): Yes    Lack of Transportation (Non-Medical): Yes  Physical Activity: Not on file  Stress: Not on file  Social Connections: Not on file  Intimate Partner Violence: Not At Risk (02/07/2022)   Humiliation, Afraid, Rape, and Kick questionnaire    Fear of Current or Ex-Partner: No    Emotionally Abused: No    Physically Abused: No    Sexually Abused: No    Allergies  Allergen Reactions    Lisinopril Other (See Comments)    Patient does not wish to take this medication (she heard that it can make your kidneys worse)   Amoxicillin Rash    Current Outpatient Medications  Medication Sig Dispense Refill   amLODipine (NORVASC) 10 MG tablet Take 1 tablet (10 mg total) by mouth daily. 90 tablet 1   carvedilol (COREG) 6.25 MG tablet Take 1 tablet (6.25 mg total) by mouth 2 (two) times daily with a meal. 60 tablet 3   fluticasone (FLONASE) 50 MCG/ACT nasal spray Place 1 spray into both nostrils daily. 16 g 0   hydrALAZINE (APRESOLINE) 25 MG tablet Take 25 mg by mouth 3 (three) times daily.     QUEtiapine (SEROQUEL) 50 MG tablet Take 1 tablet (50 mg total) by mouth at bedtime. 30 tablet 0   valsartan (DIOVAN) 320 MG tablet Take 1 tablet (320 mg total) by mouth daily. 90 tablet 1   No current facility-administered medications for this visit.    PHYSICAL EXAM Vitals:   04/30/22 1352  BP: (!) 157/63  Pulse: 68  Resp: 20  Temp: 98.2 F (36.8 C)  SpO2: 94%  Weight: 140 lb (63.5 kg)  Height: 5\' 2"  (1.575 m)   Chronically ill woman in no acute distress Regular rate and rhythm Unlabored breathing 2+ radial pulses bilaterally   PERTINENT LABORATORY AND RADIOLOGIC DATA  Most recent CBC    Latest Ref Rng & Units 03/02/2022    6:52 AM 02/11/2022   10:20 AM 02/09/2022   12:33 AM  CBC  WBC 4.0 - 10.5 K/uL 4.8  6.3  6.1   Hemoglobin 12.0 - 15.0 g/dL 9.7  8.0  7.6   Hematocrit 36.0 - 46.0 % 28.7  24.2  22.1   Platelets 150 - 400 K/uL 245  237  220      Most recent CMP    Latest Ref Rng & Units 03/02/2022    6:52 AM 02/13/2022   12:17 AM 02/12/2022   12:31 AM  CMP  Glucose 70 - 99 mg/dL 150  162  196   BUN 8 - 23 mg/dL 66  55  56   Creatinine 0.44 - 1.00 mg/dL 5.56  5.49  5.42   Sodium 135 - 145 mmol/L 136  138  137   Potassium 3.5 - 5.1 mmol/L 3.8  4.7  4.4   Chloride 98 - 111 mmol/L 104  102  102   CO2 22 - 32 mmol/L 21  24  24    Calcium 8.9 - 10.3 mg/dL 8.8  8.8  8.5    Total Protein 6.5 - 8.1 g/dL 7.0     Total Bilirubin 0.3 - 1.2 mg/dL 0.2  Alkaline Phos 38 - 126 U/L 70     AST 15 - 41 U/L 18     ALT 0 - 44 U/L 17       Renal function CrCl cannot be calculated (Patient's most recent lab result is older than the maximum 21 days allowed.).  HbA1c, POC (controlled diabetic range) (%)  Date Value  01/24/2020 6.7   Hgb A1c MFr Bld (%)  Date Value  02/07/2022 6.7 (H)    LDL Chol Calc (NIH)  Date Value Ref Range Status  01/13/2019 158 (H) 0 - 99 mg/dL Final   LDL Cholesterol  Date Value Ref Range Status  11/18/2020 119 (H) 0 - 99 mg/dL Final    Comment:           Total Cholesterol/HDL:CHD Risk Coronary Heart Disease Risk Table                     Men   Women  1/2 Average Risk   3.4   3.3  Average Risk       5.0   4.4  2 X Average Risk   9.6   7.1  3 X Average Risk  23.4   11.0        Use the calculated Patient Ratio above and the CHD Risk Table to determine the patient's CHD Risk.        ATP III CLASSIFICATION (LDL):  <100     mg/dL   Optimal  100-129  mg/dL   Near or Above                    Optimal  130-159  mg/dL   Borderline  160-189  mg/dL   High  >190     mg/dL   Very High Performed at [facility] [address] [place], [place] [address]      Vascular Imaging: Vein mapping shows no usable superficial vein in the arms bilaterally  Yevonne Aline. Stanford Breed, MD FACS [facility] Phone Number: [phone] 05/01/2022 8:07 AM   Total time spent on preparing this encounter including chart review, data review, collecting history, examining the patient, coordinating care for this new patient, 60 minutes.  Portions of this report may have been transcribed using voice recognition software.  Every effort has been made to ensure accuracy; however, inadvertent computerized transcription errors may still be present.

## 2022-05-06 ENCOUNTER — Telehealth (HOSPITAL_COMMUNITY): Payer: Self-pay | Admitting: *Deleted

## 2022-05-06 NOTE — Telephone Encounter (Signed)
Received fax from Dr Jannifer Hick requesting AVG to be placed ASAP. Last saw [facility] 04/30/2022. Will give to [facility]

## 2022-05-07 ENCOUNTER — Telehealth: Payer: Self-pay

## 2022-05-07 NOTE — Telephone Encounter (Signed)
Called pt to schedule surgery based on referral from Dr. Caprice Red office for AVG placement. No answer, vm full.  Pt returned call.  Called pt, two identifiers used. Pt was surprised and not ready to scheduled. She asked for the rest of the week to think about it and promised to call back on Monday to schedule. Confirmed understanding.

## 2022-05-13 NOTE — Telephone Encounter (Signed)
Attempted to contact patient to follow up on scheduling AVG placement, but no answer and vm full.

## 2022-05-27 ENCOUNTER — Other Ambulatory Visit: Payer: Self-pay

## 2022-05-27 DIAGNOSIS — N185 Chronic kidney disease, stage 5: Secondary | ICD-10-CM

## 2022-05-31 ENCOUNTER — Encounter (HOSPITAL_COMMUNITY): Payer: Self-pay | Admitting: Vascular Surgery

## 2022-05-31 ENCOUNTER — Other Ambulatory Visit: Payer: Self-pay

## 2022-05-31 NOTE — Progress Notes (Signed)
Spoke with pt for pre-op call. Pt denies cardiac history. Pt has hx of HTN and Diabetes. Pt is not on any diabetic medications. States her fasting blood sugar is usually around 115. Instructed pt to check her blood sugar when she wakes up Monday AM. If blood sugar is 70 or below, treat with 1/2 cup of clear juice (apple or cranberry) and recheck blood sugar 15 minutes after drinking juice. If blood sugar continues to be 70 or below, call the Short Stay department and ask to speak to a nurse.  Shower instructions given to pt.

## 2022-05-31 NOTE — Addendum Note (Signed)
Addended by: Primitivo Gauze on: 05/31/2022 09:51 AM   Modules accepted: Orders

## 2022-06-03 ENCOUNTER — Ambulatory Visit (HOSPITAL_COMMUNITY): Payer: Medicaid Other | Admitting: Physician Assistant

## 2022-06-03 ENCOUNTER — Ambulatory Visit (HOSPITAL_BASED_OUTPATIENT_CLINIC_OR_DEPARTMENT_OTHER): Payer: Medicaid Other | Admitting: Physician Assistant

## 2022-06-03 ENCOUNTER — Other Ambulatory Visit: Payer: Self-pay

## 2022-06-03 ENCOUNTER — Encounter (HOSPITAL_COMMUNITY)
Admission: RE | Disposition: A | Discharge status: Home / Self Care | Payer: Self-pay | Source: Home / Self Care | Attending: Vascular Surgery

## 2022-06-03 ENCOUNTER — Observation Stay (HOSPITAL_COMMUNITY)
Admission: RE | Admit: 2022-06-03 | Discharge: 2022-06-04 | Disposition: A | Discharge status: Home / Self Care | Payer: Medicaid Other | Attending: Vascular Surgery | Admitting: Vascular Surgery

## 2022-06-03 DIAGNOSIS — E1122 Type 2 diabetes mellitus with diabetic chronic kidney disease: Secondary | ICD-10-CM

## 2022-06-03 DIAGNOSIS — N186 End stage renal disease: Secondary | ICD-10-CM | POA: Diagnosis not present

## 2022-06-03 DIAGNOSIS — I120 Hypertensive chronic kidney disease with stage 5 chronic kidney disease or end stage renal disease: Secondary | ICD-10-CM | POA: Diagnosis not present

## 2022-06-03 DIAGNOSIS — N185 Chronic kidney disease, stage 5: Secondary | ICD-10-CM | POA: Diagnosis not present

## 2022-06-03 DIAGNOSIS — Z79899 Other long term (current) drug therapy: Secondary | ICD-10-CM | POA: Diagnosis not present

## 2022-06-03 DIAGNOSIS — Z87891 Personal history of nicotine dependence: Secondary | ICD-10-CM

## 2022-06-03 HISTORY — PX: AV FISTULA PLACEMENT: SHX1204

## 2022-06-03 LAB — CBC
HCT: 24.9 % — ABNORMAL LOW (ref 36.0–46.0)
Hemoglobin: 8.2 g/dL — ABNORMAL LOW (ref 12.0–15.0)
MCH: 29.2 pg (ref 26.0–34.0)
MCHC: 32.9 g/dL (ref 30.0–36.0)
MCV: 88.6 fL (ref 80.0–100.0)
Platelets: 207 10*3/uL (ref 150–400)
RBC: 2.81 MIL/uL — ABNORMAL LOW (ref 3.87–5.11)
RDW: 13.3 % (ref 11.5–15.5)
WBC: 6.9 10*3/uL (ref 4.0–10.5)
nRBC: 0 % (ref 0.0–0.2)

## 2022-06-03 LAB — POCT I-STAT, CHEM 8
BUN: 56 mg/dL — ABNORMAL HIGH (ref 8–23)
Calcium, Ion: 1.15 mmol/L (ref 1.15–1.40)
Chloride: 108 mmol/L (ref 98–111)
Creatinine, Ser: 5.9 mg/dL — ABNORMAL HIGH (ref 0.44–1.00)
Glucose, Bld: 122 mg/dL — ABNORMAL HIGH (ref 70–99)
HCT: 31 % — ABNORMAL LOW (ref 36.0–46.0)
Hemoglobin: 10.5 g/dL — ABNORMAL LOW (ref 12.0–15.0)
Potassium: 3.9 mmol/L (ref 3.5–5.1)
Sodium: 140 mmol/L (ref 135–145)
TCO2: 21 mmol/L — ABNORMAL LOW (ref 22–32)

## 2022-06-03 LAB — CREATININE, SERUM
Creatinine, Ser: 5.57 mg/dL — ABNORMAL HIGH (ref 0.44–1.00)
GFR, Estimated: 8 mL/min — ABNORMAL LOW (ref >60)

## 2022-06-03 LAB — NO BLOOD PRODUCTS

## 2022-06-03 LAB — GLUCOSE, CAPILLARY
Glucose-Capillary: 123 mg/dL — ABNORMAL HIGH (ref 70–99)
Glucose-Capillary: 129 mg/dL — ABNORMAL HIGH (ref 70–99)
Glucose-Capillary: 216 mg/dL — ABNORMAL HIGH (ref 70–99)

## 2022-06-03 SURGERY — INSERTION OF ARTERIOVENOUS (AV) GORE-TEX GRAFT ARM
Anesthesia: Monitor Anesthesia Care | Site: Arm Upper | Laterality: Left

## 2022-06-03 MED ORDER — POTASSIUM CHLORIDE CRYS ER 20 MEQ PO TBCR
20.0000 meq | EXTENDED_RELEASE_TABLET | Freq: Once | ORAL | Status: AC
  Administered 2022-06-03: 20 meq via ORAL
  Filled 2022-06-03: qty 2

## 2022-06-03 MED ORDER — OXYCODONE HCL 5 MG PO TABS: 5.0000 mg | ORAL_TABLET | Freq: Once | ORAL | Status: DC | PRN

## 2022-06-03 MED ORDER — VANCOMYCIN HCL IN DEXTROSE 1-5 GM/200ML-% IV SOLN
1000.0000 mg | INTRAVENOUS | Status: AC
  Administered 2022-06-03: 1000 mg via INTRAVENOUS
  Filled 2022-06-03: qty 200

## 2022-06-03 MED ORDER — HEPARIN 6000 UNIT IRRIGATION SOLUTION
Status: AC
  Filled 2022-06-03: qty 500

## 2022-06-03 MED ORDER — ALUM & MAG HYDROXIDE-SIMETH 200-200-20 MG/5ML PO SUSP: 15.0000 mL | ORAL | Status: DC | PRN

## 2022-06-03 MED ORDER — ONDANSETRON HCL 4 MG/2ML IJ SOLN: 4.0000 mg | Freq: Four times a day (QID) | INTRAMUSCULAR | Status: DC | PRN

## 2022-06-03 MED ORDER — LIDOCAINE-EPINEPHRINE (PF) 1 %-1:200000 IJ SOLN
INTRAMUSCULAR | Status: DC | PRN
  Administered 2022-06-03: 7 mL

## 2022-06-03 MED ORDER — CHLORHEXIDINE GLUCONATE 0.12 % MT SOLN: 15.0000 mL | Freq: Once | OROMUCOSAL | Status: AC

## 2022-06-03 MED ORDER — HYDROMORPHONE HCL 1 MG/ML IJ SOLN: 0.5000 mg | INTRAMUSCULAR | Status: DC | PRN

## 2022-06-03 MED ORDER — ONDANSETRON HCL 4 MG/2ML IJ SOLN
INTRAMUSCULAR | Status: AC
  Filled 2022-06-03: qty 2

## 2022-06-03 MED ORDER — GUAIFENESIN-DM 100-10 MG/5ML PO SYRP: 15.0000 mL | ORAL_SOLUTION | ORAL | Status: DC | PRN

## 2022-06-03 MED ORDER — FLUTICASONE PROPIONATE 50 MCG/ACT NA SUSP
1.0000 | Freq: Every day | NASAL | Status: DC
  Administered 2022-06-04: 1 via NASAL
  Filled 2022-06-03: qty 16

## 2022-06-03 MED ORDER — HYDRALAZINE HCL 25 MG PO TABS
25.0000 mg | ORAL_TABLET | Freq: Three times a day (TID) | ORAL | Status: DC
  Administered 2022-06-03 – 2022-06-04 (×3): 25 mg via ORAL
  Filled 2022-06-03 (×3): qty 1

## 2022-06-03 MED ORDER — OXYCODONE-ACETAMINOPHEN 5-325 MG PO TABS
1.0000 | ORAL_TABLET | ORAL | Status: DC | PRN
  Administered 2022-06-03: 1 via ORAL
  Filled 2022-06-03: qty 1

## 2022-06-03 MED ORDER — 0.9 % SODIUM CHLORIDE (POUR BTL) OPTIME
TOPICAL | Status: DC | PRN
  Administered 2022-06-03: 1000 mL

## 2022-06-03 MED ORDER — DIPHENHYDRAMINE HCL 50 MG/ML IJ SOLN: 12.5000 mg | Freq: Once | INTRAMUSCULAR | Status: AC

## 2022-06-03 MED ORDER — DOCUSATE SODIUM 100 MG PO CAPS
100.0000 mg | ORAL_CAPSULE | Freq: Two times a day (BID) | ORAL | Status: DC
  Administered 2022-06-03 – 2022-06-04 (×2): 100 mg via ORAL
  Filled 2022-06-03 (×3): qty 1

## 2022-06-03 MED ORDER — OXYCODONE HCL 5 MG/5ML PO SOLN: 5.0000 mg | Freq: Once | ORAL | Status: DC | PRN

## 2022-06-03 MED ORDER — AMLODIPINE BESYLATE 10 MG PO TABS
10.0000 mg | ORAL_TABLET | Freq: Every day | ORAL | Status: DC
  Administered 2022-06-04: 10 mg via ORAL
  Filled 2022-06-03 (×2): qty 1

## 2022-06-03 MED ORDER — PHENOL 1.4 % MT LIQD: 1.0000 | OROMUCOSAL | Status: DC | PRN

## 2022-06-03 MED ORDER — PROPOFOL 500 MG/50ML IV EMUL
INTRAVENOUS | Status: DC | PRN
  Administered 2022-06-03: 75 ug/kg/min via INTRAVENOUS

## 2022-06-03 MED ORDER — LIDOCAINE-EPINEPHRINE (PF) 1.5 %-1:200000 IJ SOLN
INTRAMUSCULAR | Status: DC | PRN
  Administered 2022-06-03: 20 mL via PERINEURAL

## 2022-06-03 MED ORDER — ORAL CARE MOUTH RINSE: 15.0000 mL | Freq: Once | OROMUCOSAL | Status: AC

## 2022-06-03 MED ORDER — SODIUM CHLORIDE 0.9% FLUSH
3.0000 mL | Freq: Two times a day (BID) | INTRAVENOUS | Status: DC
  Administered 2022-06-03 (×2): 3 mL via INTRAVENOUS

## 2022-06-03 MED ORDER — FENTANYL CITRATE (PF) 100 MCG/2ML IJ SOLN
25.0000 ug | INTRAMUSCULAR | Status: DC | PRN
  Administered 2022-06-03: 25 ug via INTRAVENOUS

## 2022-06-03 MED ORDER — DIPHENHYDRAMINE HCL 50 MG/ML IJ SOLN
INTRAMUSCULAR | Status: AC
  Administered 2022-06-03: 12.5 mg via INTRAVENOUS
  Filled 2022-06-03: qty 1

## 2022-06-03 MED ORDER — SODIUM CHLORIDE 0.9% FLUSH: 3.0000 mL | INTRAVENOUS | Status: DC | PRN

## 2022-06-03 MED ORDER — MEPIVACAINE HCL (PF) 2 % IJ SOLN
INTRAMUSCULAR | Status: DC | PRN
  Administered 2022-06-03: 10 mL

## 2022-06-03 MED ORDER — ONDANSETRON HCL 4 MG/2ML IJ SOLN: 4.0000 mg | Freq: Once | INTRAMUSCULAR | Status: DC | PRN

## 2022-06-03 MED ORDER — FENTANYL CITRATE (PF) 100 MCG/2ML IJ SOLN
INTRAMUSCULAR | Status: AC
  Administered 2022-06-03: 50 ug via INTRAVENOUS
  Filled 2022-06-03: qty 2

## 2022-06-03 MED ORDER — FENTANYL CITRATE (PF) 100 MCG/2ML IJ SOLN
INTRAMUSCULAR | Status: AC
  Filled 2022-06-03: qty 2

## 2022-06-03 MED ORDER — METOPROLOL TARTRATE 5 MG/5ML IV SOLN: 2.0000 mg | INTRAVENOUS | Status: DC | PRN

## 2022-06-03 MED ORDER — PANTOPRAZOLE SODIUM 40 MG PO TBEC
40.0000 mg | DELAYED_RELEASE_TABLET | Freq: Every day | ORAL | Status: DC
  Administered 2022-06-03 – 2022-06-04 (×2): 40 mg via ORAL
  Filled 2022-06-03 (×2): qty 1

## 2022-06-03 MED ORDER — EPHEDRINE SULFATE-NACL 50-0.9 MG/10ML-% IV SOSY
PREFILLED_SYRINGE | INTRAVENOUS | Status: DC | PRN
  Administered 2022-06-03: 2.5 mg via INTRAVENOUS

## 2022-06-03 MED ORDER — HEPARIN 6000 UNIT IRRIGATION SOLUTION
Status: DC | PRN
  Administered 2022-06-03: 1

## 2022-06-03 MED ORDER — CHLORHEXIDINE GLUCONATE 4 % EX SOLN: 60.0000 mL | Freq: Once | CUTANEOUS | Status: DC

## 2022-06-03 MED ORDER — FENTANYL CITRATE (PF) 100 MCG/2ML IJ SOLN: 50.0000 ug | Freq: Once | INTRAMUSCULAR | Status: AC

## 2022-06-03 MED ORDER — SODIUM CHLORIDE 0.9 % IV SOLN: 250.0000 mL | INTRAVENOUS | Status: DC | PRN

## 2022-06-03 MED ORDER — IRBESARTAN 300 MG PO TABS
300.0000 mg | ORAL_TABLET | Freq: Every day | ORAL | Status: DC
  Administered 2022-06-03 – 2022-06-04 (×2): 300 mg via ORAL
  Filled 2022-06-03 (×2): qty 1

## 2022-06-03 MED ORDER — CHLORHEXIDINE GLUCONATE 0.12 % MT SOLN
OROMUCOSAL | Status: AC
  Administered 2022-06-03: 15 mL via OROMUCOSAL
  Filled 2022-06-03: qty 15

## 2022-06-03 MED ORDER — SENNOSIDES-DOCUSATE SODIUM 8.6-50 MG PO TABS: 1.0000 | ORAL_TABLET | Freq: Every evening | ORAL | Status: DC | PRN

## 2022-06-03 MED ORDER — PROPOFOL 1000 MG/100ML IV EMUL
INTRAVENOUS | Status: AC
  Filled 2022-06-03: qty 100

## 2022-06-03 MED ORDER — LIDOCAINE 2% (20 MG/ML) 5 ML SYRINGE
INTRAMUSCULAR | Status: AC
  Filled 2022-06-03: qty 5

## 2022-06-03 MED ORDER — HYDRALAZINE HCL 20 MG/ML IJ SOLN: 5.0000 mg | INTRAMUSCULAR | Status: DC | PRN

## 2022-06-03 MED ORDER — LIDOCAINE-EPINEPHRINE (PF) 1 %-1:200000 IJ SOLN
INTRAMUSCULAR | Status: AC
  Filled 2022-06-03: qty 30

## 2022-06-03 MED ORDER — LIDOCAINE 2% (20 MG/ML) 5 ML SYRINGE
INTRAMUSCULAR | Status: DC | PRN
  Administered 2022-06-03: 40 mg via INTRAVENOUS

## 2022-06-03 MED ORDER — SODIUM CHLORIDE 0.9 % IV SOLN: INTRAVENOUS | Status: DC

## 2022-06-03 MED ORDER — ONDANSETRON HCL 4 MG/2ML IJ SOLN
INTRAMUSCULAR | Status: DC | PRN
  Administered 2022-06-03: 4 mg via INTRAVENOUS

## 2022-06-03 MED ORDER — LABETALOL HCL 5 MG/ML IV SOLN: 10.0000 mg | INTRAVENOUS | Status: DC | PRN

## 2022-06-03 MED ORDER — HEPARIN SODIUM (PORCINE) 5000 UNIT/ML IJ SOLN
5000.0000 [IU] | Freq: Three times a day (TID) | INTRAMUSCULAR | Status: DC
  Administered 2022-06-04: 5000 [IU] via SUBCUTANEOUS
  Filled 2022-06-03: qty 1

## 2022-06-03 SURGICAL SUPPLY — 45 items
APL PRP STRL LF DISP 70% ISPRP (MISCELLANEOUS) ×1
APL SKNCLS STERI-STRIP NONHPOA (GAUZE/BANDAGES/DRESSINGS) ×1
ARMBAND PINK RESTRICT EXTREMIT (MISCELLANEOUS) ×2 IMPLANT
BENZOIN TINCTURE PRP APPL 2/3 (GAUZE/BANDAGES/DRESSINGS) ×2 IMPLANT
CANISTER SUCT 3000ML PPV (MISCELLANEOUS) ×2 IMPLANT
CANNULA VESSEL 3MM 2 BLNT TIP (CANNULA) ×2 IMPLANT
CHLORAPREP W/TINT 26 (MISCELLANEOUS) ×2 IMPLANT
CLIP LIGATING EXTRA MED SLVR (CLIP) ×2 IMPLANT
CLIP LIGATING EXTRA SM BLUE (MISCELLANEOUS) ×2 IMPLANT
CLSR STERI-STRIP ANTIMIC 1/2X4 (GAUZE/BANDAGES/DRESSINGS) IMPLANT
DRSG TEGADERM 4X4.75 (GAUZE/BANDAGES/DRESSINGS) IMPLANT
ELECT REM PT RETURN 9FT ADLT (ELECTROSURGICAL) ×1
ELECTRODE REM PT RTRN 9FT ADLT (ELECTROSURGICAL) ×2 IMPLANT
GLOVE BIO SURGEON STRL SZ8 (GLOVE) ×2 IMPLANT
GOWN STRL REUS W/ TWL LRG LVL3 (GOWN DISPOSABLE) ×4 IMPLANT
GOWN STRL REUS W/ TWL XL LVL3 (GOWN DISPOSABLE) ×2 IMPLANT
GOWN STRL REUS W/TWL LRG LVL3 (GOWN DISPOSABLE) ×2
GOWN STRL REUS W/TWL XL LVL3 (GOWN DISPOSABLE) ×1
GRAFT GORETEX STRT 4-7X45 (Vascular Products) IMPLANT
HEMOSTAT SNOW SURGICEL 2X4 (HEMOSTASIS) IMPLANT
INSERT FOGARTY SM (MISCELLANEOUS) IMPLANT
KIT BASIN OR (CUSTOM PROCEDURE TRAY) ×2 IMPLANT
KIT TURNOVER KIT B (KITS) ×2 IMPLANT
LOOP VASCULAR MINI 18 RED (MISCELLANEOUS) ×1
NDL 18GX1X1/2 (RX/OR ONLY) (NEEDLE) IMPLANT
NEEDLE 18GX1X1/2 (RX/OR ONLY) (NEEDLE) IMPLANT
NS IRRIG 1000ML POUR BTL (IV SOLUTION) ×2 IMPLANT
PACK CV ACCESS (CUSTOM PROCEDURE TRAY) ×2 IMPLANT
PAD ARMBOARD 7.5X6 YLW CONV (MISCELLANEOUS) ×4 IMPLANT
SHEATH PROBE COVER 6X72 (BAG) ×2 IMPLANT
SLING ARM FOAM STRAP LRG (SOFTGOODS) IMPLANT
SLING ARM FOAM STRAP MED (SOFTGOODS) IMPLANT
STRIP CLOSURE SKIN 1/2X4 (GAUZE/BANDAGES/DRESSINGS) ×2 IMPLANT
SUT GORETEX 6.0 TT9 (SUTURE) ×2 IMPLANT
SUT MNCRL AB 4-0 PS2 18 (SUTURE) IMPLANT
SUT PROLENE 6 0 BV (SUTURE) IMPLANT
SUT SILK 2 0 SH (SUTURE) IMPLANT
SUT VIC AB 3-0 SH 27 (SUTURE) ×2
SUT VIC AB 3-0 SH 27X BRD (SUTURE) ×4 IMPLANT
SYR 3ML LL SCALE MARK (SYRINGE) IMPLANT
SYR TOOMEY 50ML (SYRINGE) IMPLANT
TOWEL GREEN STERILE (TOWEL DISPOSABLE) ×2 IMPLANT
UNDERPAD 30X36 HEAVY ABSORB (UNDERPADS AND DIAPERS) ×2 IMPLANT
VASCULAR TIE MINI RED 18IN STL (MISCELLANEOUS) IMPLANT
WATER STERILE IRR 1000ML POUR (IV SOLUTION) ×2 IMPLANT

## 2022-06-03 NOTE — Transfer of Care (Signed)
Immediate Anesthesia Transfer of Care Note  Patient: Lorraine Turner  Procedure(s) Performed: INSERTION OF ARTERIOVENOUS (AV) GORE-TEX GRAFT LEFT ARM (Left: Arm Upper)  Patient Location: PACU  Anesthesia Type:MAC and Regional  Level of Consciousness: awake and alert   Airway & Oxygen Therapy: Patient Spontanous Breathing  Post-op Assessment: Report given to RN and Post -op Vital signs reviewed and stable  Post vital signs: Reviewed and stable  Last Vitals:  Vitals Value Taken Time  BP 148/60 06/03/22 1057  Temp    Pulse 67 06/03/22 1058  Resp 13 06/03/22 1058  SpO2 95 % 06/03/22 1058  Vitals shown include unvalidated device data.  Last Pain:  Vitals:   06/03/22 0836  TempSrc:   PainSc: 0-No pain         Complications: No notable events documented.

## 2022-06-03 NOTE — H&P (Signed)
[facility] OF [place]  ASSESSMENT / PLAN: Lorraine Turner is a 62 y.o. right handed female in need of permanent dialysis access. I reviewed options for dialysis in detail with the patient, including hemodialysis and peritoneal dialysis. I counseled the patient to ask their nephrologist about their candidacy for renal transplant. I counseled the patient that dialysis access requires surveillance and periodic maintenance. Plan to proceed with left arm arteriovenous graft when her nephrologist is ready to proceed.   CHIEF COMPLAINT: worsening renal function  HISTORY OF PRESENT ILLNESS: Lorraine Turner is a 62 y.o. female referred to clinic for evaluation of worsening renal function.  The patient is a right-handed woman.  She has never required dialysis before.  We reviewed the 2 main modalities of dialysis therapy.  Ultimately, she was interested in hemodialysis.  We reviewed the options for hemodialysis access.  I reviewed her vein mapping results with her.  She does not need dialysis urgently, and her nephrologist would like [place] to wait on arteriovenous graft placement.   Past Medical History:  Diagnosis Date   AKI (acute kidney injury) 09/12/2018   Stage 4   Anxiety    Bipolar 1 disorder    Depression    Diabetes mellitus without complication    Diarrhea 11/21/2020   Gallstones    Hyperlipidemia    Hypertension    MDD (major depressive disorder), recurrent episode, moderate 11/18/2020   Neuropathy    Schizophrenia    Seizures    X1- febrile seizure as a child-none since   Suicide ideation 09/12/2018    Past Surgical History:  Procedure Laterality Date   CESAREAN SECTION  05/1998   X 1   CHOLECYSTECTOMY N/A 03/27/2016   Procedure: LAPAROSCOPIC CHOLECYSTECTOMY;  Surgeon: Henrene Dodge, MD;  Location: [facility] ORS;  Service: General;  Laterality: N/A;   TONSILLECTOMY AND ADENOIDECTOMY     age 17   TUBAL LIGATION      Family History  Problem Relation Age of  Onset   Diabetes Mother    Heart disease Mother    Kidney disease Mother    Stroke Mother    Hyperlipidemia Mother    Diabetes Maternal Aunt     Social History   Socioeconomic History   Marital status: Single    Spouse name: Not on file   Number of children: Not on file   Years of education: Not on file   Highest education level: Not on file  Occupational History   Not on file  Tobacco Use   Smoking status: Former    Types: Cigarettes    Quit date: 03/14/2013    Years since quitting: 9.2   Smokeless tobacco: Never   Tobacco comments:    1 cigarette1-2 times year, only when she gets stressed  Pt states she never smoked   Vaping Use   Vaping Use: Never used  Substance and Sexual Activity   Alcohol use: Not Currently    Alcohol/week: 1.0 standard drink of alcohol    Types: 1 Cans of beer per week    Comment: One can of beer every 2-3 months.    Drug use: No   Sexual activity: Not Currently  Other Topics Concern   Not on file  Social History Narrative   ** Merged History Encounter **       Social Determinants of Health   Financial Resource Strain: High Risk (02/26/2022)   Overall Financial Resource Strain (CARDIA)    Difficulty of Paying Living Expenses:  Very hard  Food Insecurity: Food Insecurity Present (02/25/2022)   Hunger Vital Sign    Worried About Running Out of Food in the Last Year: Sometimes true    Ran Out of Food in the Last Year: Never true  Transportation Needs: Unmet Transportation Needs (02/25/2022)   PRAPARE - Administrator, Civil Service (Medical): Yes    Lack of Transportation (Non-Medical): Yes  Physical Activity: Not on file  Stress: Not on file  Social Connections: Not on file  Intimate Partner Violence: Not At Risk (02/07/2022)   Humiliation, Afraid, Rape, and Kick questionnaire    Fear of Current or Ex-Partner: No    Emotionally Abused: No    Physically Abused: No    Sexually Abused: No    Allergies  Allergen Reactions    Lisinopril Other (See Comments)    Patient does not wish to take this medication (she heard that it can make your kidneys worse)   Amoxicillin Rash    Current Facility-Administered Medications  Medication Dose Route Frequency Provider Last Rate Last Admin   0.9 %  sodium chloride infusion   Intravenous Continuous Leonie Douglas, MD       chlorhexidine (HIBICLENS) 4 % liquid 4 Application  60 mL Topical Once Leonie Douglas, MD       And   [START ON 06/04/2022] chlorhexidine (HIBICLENS) 4 % liquid 4 Application  60 mL Topical Once Leonie Douglas, MD       chlorhexidine (PERIDEX) 0.12 % solution 15 mL  15 mL Mouth/Throat Once Eilene Ghazi, MD       Or   Oral care mouth rinse  15 mL Mouth Rinse Once Eilene Ghazi, MD       chlorhexidine (PERIDEX) 0.12 % solution            diphenhydrAMINE (BENADRYL) 50 MG/ML injection            fentaNYL (SUBLIMAZE) 100 MCG/2ML injection            vancomycin (VANCOCIN) IVPB 1000 mg/200 mL premix  1,000 mg Intravenous 60 min Pre-Op Leonie Douglas, MD        PHYSICAL EXAM Vitals:   06/03/22 0723 06/03/22 0830  BP: (!) 179/77 (!) 161/65  Pulse: 67 63  Resp: 16 12  Temp: 98.6 F (37 C)   TempSrc: Oral   SpO2: 99% 99%  Weight: 63.5 kg   Height:  (1.549 m)    Chronically ill woman in no acute distress Regular rate and rhythm Unlabored breathing 2+ radial pulses bilaterally   PERTINENT LABORATORY AND RADIOLOGIC DATA  Most recent CBC    Latest Ref Rng & Units 06/03/2022    7:49 AM 03/02/2022    6:52 AM 02/11/2022   10:20 AM  CBC  WBC 4.0 - 10.5 K/uL  4.8  6.3   Hemoglobin 12.0 - 15.0 g/dL 16.1  9.7  8.0   Hematocrit 36.0 - 46.0 % 31.0  28.7  24.2   Platelets 150 - 400 K/uL  245  237      Most recent CMP    Latest Ref Rng & Units 06/03/2022    7:49 AM 03/02/2022    6:52 AM 02/13/2022   12:17 AM  CMP  Glucose 70 - 99 mg/dL 096  045  409   BUN 8 - 23 mg/dL 56  66  55   Creatinine 0.44 - 1.00 mg/dL 8.11  9.14  7.82   Sodium 135 -  145 mmol/L 140  136  138   Potassium 3.5 - 5.1 mmol/L 3.9  3.8  4.7   Chloride 98 - 111 mmol/L 108  104  102   CO2 22 - 32 mmol/L  21  24   Calcium 8.9 - 10.3 mg/dL  8.8  8.8   Total Protein 6.5 - 8.1 g/dL  7.0    Total Bilirubin 0.3 - 1.2 mg/dL  0.2    Alkaline Phos 38 - 126 U/L  70    AST 15 - 41 U/L  18    ALT 0 - 44 U/L  17      Renal function Estimated Creatinine Clearance: 8.6 mL/min (A) (by C-G formula based on SCr of 5.9 mg/dL (H)).  HbA1c, POC (controlled diabetic range) (%)  Date Value  01/24/2020 6.7   Hgb A1c MFr Bld (%)  Date Value  02/07/2022 6.7 (H)    LDL Chol Calc (NIH)  Date Value Ref Range Status  01/13/2019 158 (H) 0 - 99 mg/dL Final   LDL Cholesterol  Date Value Ref Range Status  11/18/2020 119 (H) 0 - 99 mg/dL Final    Comment:           Total Cholesterol/HDL:CHD Risk Coronary Heart Disease Risk Table                     Men   Women  1/2 Average Risk   3.4   3.3  Average Risk       5.0   4.4  2 X Average Risk   9.6   7.1  3 X Average Risk  23.4   11.0        Use the calculated Patient Ratio above and the CHD Risk Table to determine the patient's CHD Risk.        ATP III CLASSIFICATION (LDL):  <100     mg/dL   Optimal  161-096  mg/dL   Near or Above                    Optimal  130-159  mg/dL   Borderline  045-409  mg/dL   High  >811     mg/dL   Very High Performed at [facility], [address], [place], [place] [address]      Vascular Imaging: Vein mapping shows no usable superficial vein in the arms bilaterally  Rande Brunt. Lenell Antu, MD FACS [facility] Phone Number: [phone] 06/03/2022 9:02 AM   Total time spent on preparing this encounter including chart review, data review, collecting history, examining the patient, coordinating care for this new patient, 60 minutes.  Portions of this report may have been transcribed using voice recognition software.  Every effort  has been made to ensure accuracy; however, inadvertent computerized transcription errors may still be present.

## 2022-06-03 NOTE — Anesthesia Postprocedure Evaluation (Signed)
Anesthesia Post Note  Patient: Lorraine Turner  Procedure(s) Performed: INSERTION OF ARTERIOVENOUS (AV) GORE-TEX GRAFT LEFT ARM (Left: Arm Upper)     Patient location during evaluation: PACU Anesthesia Type: MAC Level of consciousness: awake and alert Pain management: pain level controlled Vital Signs Assessment: post-procedure vital signs reviewed and stable Respiratory status: spontaneous breathing, nonlabored ventilation, respiratory function stable and patient connected to nasal cannula oxygen Cardiovascular status: stable and blood pressure returned to baseline Postop Assessment: no apparent nausea or vomiting Anesthetic complications: no  No notable events documented.  Last Vitals:  Vitals:   06/03/22 1130 06/03/22 1145  BP: (!) 140/60 (!) 156/61  Pulse: 64 73  Resp: 17 13  Temp:    SpO2: 93% 94%    Last Pain:  Vitals:   06/03/22 1130  TempSrc:   PainSc: 3                  [place] Dod[place] S

## 2022-06-03 NOTE — Plan of Care (Signed)

## 2022-06-03 NOTE — Progress Notes (Signed)
Notified Dr. Lenell Antu that patient says she has no one to stay with her at home after surgery and that she is unsure of her transportation home. Dr. Lenell Antu states she will be admitted overnight.

## 2022-06-03 NOTE — Anesthesia Preprocedure Evaluation (Signed)
Anesthesia Evaluation  Patient identified by MRN, date of birth, ID band Patient awake    Reviewed: Allergy & Precautions, H&P , NPO status , Patient's Chart, lab work & pertinent test results  Airway Mallampati: II  TM Distance: >3 FB Neck ROM: Full    Dental no notable dental hx.    Pulmonary neg pulmonary ROS, former smoker   Pulmonary exam normal breath sounds clear to auscultation       Cardiovascular hypertension, Normal cardiovascular exam Rhythm:Regular Rate:Normal     Neuro/Psych   Anxiety Depression  Schizophrenia  negative neurological ROS     GI/Hepatic negative GI ROS, Neg liver ROS,,,  Endo/Other  diabetes    Renal/GU ESRFRenal disease  negative genitourinary   Musculoskeletal negative musculoskeletal ROS (+)    Abdominal   Peds negative pediatric ROS (+)  Hematology negative hematology ROS (+)   Anesthesia Other Findings   Reproductive/Obstetrics negative OB ROS                             Anesthesia Physical Anesthesia Plan  ASA: 3  Anesthesia Plan: MAC   Post-op Pain Management: Regional block*   Induction: Intravenous  PONV Risk Score and Plan: 2 and Propofol infusion and Treatment may vary due to age or medical condition  Airway Management Planned: Simple Face Mask  Additional Equipment:   Intra-op Plan:   Post-operative Plan:   Informed Consent: I have reviewed the patients History and Physical, chart, labs and discussed the procedure including the risks, benefits and alternatives for the proposed anesthesia with the patient or authorized representative who has indicated his/her understanding and acceptance.     Dental advisory given  Plan Discussed with: CRNA and Surgeon  Anesthesia Plan Comments:        Anesthesia Quick Evaluation

## 2022-06-03 NOTE — Anesthesia Procedure Notes (Signed)
Anesthesia Procedure Image    

## 2022-06-03 NOTE — Anesthesia Procedure Notes (Signed)
Anesthesia Regional Block: Supraclavicular block   Pre-Anesthetic Checklist: , timeout performed,  Correct Patient, Correct Site, Correct Laterality,  Correct Procedure, Correct Position, site marked,  Risks and benefits discussed,  Surgical consent,  Pre-op evaluation,  At surgeon's request and post-op pain management  Laterality: Left  Prep: chloraprep       Needles:  Injection technique: Single-shot  Needle Type: Echogenic Needle     Needle Length: 9cm      Additional Needles:   Procedures:,,,, ultrasound used (permanent image in chart),,    Narrative:  Start time: 06/03/2022 9:12 AM End time: 06/03/2022 9:20 AM Injection made incrementally with aspirations every 5 mL.  Performed by: Personally  Anesthesiologist: Eilene Ghazi, MD  Additional Notes: Patient tolerated the procedure well without complications

## 2022-06-03 NOTE — Anesthesia Procedure Notes (Signed)
Procedure Name: MAC Date/Time: 06/03/2022 9:40 AM  Performed by: Maxine Glenn, CRNAPre-anesthesia Checklist: Patient identified, Emergency Drugs available, Suction available and Patient being monitored Patient Re-evaluated:Patient Re-evaluated prior to induction Oxygen Delivery Method: Simple face mask

## 2022-06-03 NOTE — TOC CM/SW Note (Signed)
  Transition of Care [facility]) Screening Note   Patient Details  Name: Lorraine Turner Date of Birth: [birth year]   Left arm arteriovenous graft     Transition of Care Department [facility]) has reviewed patient and no TOC needs have been identified at this time. We will continue to monitor patient advancement through interdisciplinary progression rounds. If new patient transition needs arise, please place a TOC consult.

## 2022-06-03 NOTE — Op Note (Signed)
DATE OF SERVICE: 06/03/2022  PATIENT:  Lorraine Turner  62 y.o. female  PRE-OPERATIVE DIAGNOSIS:  ESRD   POST-OPERATIVE DIAGNOSIS:  Same  PROCEDURE:   Left arm arteriovenous graft  SURGEON:  Surgeon(s) and Role:    * Leonie Douglas, MD - Primary  ASSISTANT: Lianne Cure, PA-C  An experienced assistant was required given the complexity of this procedure and the standard of surgical care. My assistant helped with exposure through counter tension, suctioning, ligation and retraction to better visualize the surgical field.  My assistant expedited sewing during the case by following my sutures. Wherever I use the term "we" in the report, my assistant actively helped me with that portion of the procedure.  ANESTHESIA:   regional and MAC  EBL: minimal  BLOOD ADMINISTERED:none  DRAINS: none   LOCAL MEDICATIONS USED:  NONE  SPECIMEN:  none  COUNTS: confirmed correct.  TOURNIQUET:  none  PATIENT DISPOSITION:  PACU - hemodynamically stable.   Delay start of Pharmacological VTE agent (>24hrs) due to surgical blood loss or risk of bleeding: no  INDICATION FOR PROCEDURE: Lorraine Turner is a 62 y.o. female with ESRD in need of permanent dialysis access. After careful discussion of risks, benefits, and alternatives the patient was offered left arm arteriovenous graft. The patient understood and wished to proceed.  OPERATIVE FINDINGS: successful arteriovenous graft. Good doppler flow at completion in the radial artery. Good doppler flow in the outflow vein at completion.  DESCRIPTION OF PROCEDURE: After identification of the patient in the pre-operative holding area, the patient was transferred to the operating room. The patient was positioned supine on the operating room table. Anesthesia was induced. The left arm was prepped and draped in standard fashion. A surgical pause was performed confirming correct patient, procedure, and operative location.  The left brachial artery was  exposed using a longitudinal incision in the distal arm just above the antecubital fossa.  Incision was carried down through subcutaneous tissue until the brachial sheath was encountered.  This was incised sharply.  The brachial artery was exposed and encircled with Silastic Vesseloops proximally and distally to the site of planned inflow.   The left brachial vein at the axilla was exposed using longitudinal incision just below the hairbearing area of the axilla.  Incision was carried down until the brachial sheath was encountered.  The brachial vein was identified, exposed, encircled with Silastic Vesseloops.   Using a curved, sheathed tunneling device, a 4-7 mm tapered Gore-Tex graft was tunneled subcutaneously and gentle arc across the biceps of the left arm.  Patient was then heparinized with 5000 units of IV heparin.   The brachial artery was clamped proximally and distally.  An anterior arteriotomy was made with an 11 blade.  This was extended with Potts scissors.  The 4 mm end of the Gore-Tex graft was spatulated and then anastomosed end-to-side to the brachial arteriotomy using continuous running suture of 6-0 Prolene.  The anastomosis was completed and hemostasis ensured.  The graft was clamped to restore perfusion to the hand.   The brachial vein was clamped proximally and distally.  An anterior venotomy was made with an 11 blade.  This was extended with Potts scissors.  The 7 mm end of the Gore-Tex graft was then anastomosed end to side to the brachial vein venotomy using continuous running suture of 6-0 Prolene.  Immediately prior to completion the anastomosis was de-aired and flushed.  Anastomosis was then completed.  Hemostasis was insured.   Doppler machine  was brought onto the field to interrogate the graft. Doppler flow was noted in the radial artery.  About the arterial anastomosis flow was noted proximal and distal to the arterial anastomosis.  Distal to the venous anastomosis a Doppler  bruit was heard.  Satisfied we ended the case here.   Surgical beds were irrigated copiously.  Hemostasis was again ensured in the surgical beds.  The wounds were closed in layers using 3-0 Vicryl and 4-0 Monocryl.  Clean bandages were applied.  Upon completion of the case instrument and sharps counts were confirmed correct. The patient was transferred to the PACU in good condition. I was present for all portions of the procedure.  FOLLOW UP PLAN: Patient has no one to stay with her tonight. Will observe in hospital overnight. Discharge tomorrow. Follow up PRN as long as no evidence of steal tomorrow.   Rande Brunt. Lenell Antu, MD [facility] Phone Number: [phone] 06/03/2022 10:44 AM

## 2022-06-04 ENCOUNTER — Other Ambulatory Visit: Payer: Self-pay | Admitting: Physician Assistant

## 2022-06-04 ENCOUNTER — Encounter (HOSPITAL_COMMUNITY): Payer: Self-pay | Admitting: Vascular Surgery

## 2022-06-04 DIAGNOSIS — N186 End stage renal disease: Secondary | ICD-10-CM | POA: Diagnosis not present

## 2022-06-04 MED ORDER — AMLODIPINE BESYLATE 10 MG PO TABS: 10.0000 mg | ORAL_TABLET | Freq: Every day | ORAL | 0 refills | Status: DC

## 2022-06-04 MED ORDER — OXYCODONE-ACETAMINOPHEN 5-325 MG PO TABS: 1.0000 | ORAL_TABLET | Freq: Four times a day (QID) | ORAL | 0 refills | Status: DC | PRN

## 2022-06-04 NOTE — Plan of Care (Signed)

## 2022-06-04 NOTE — Progress Notes (Addendum)
Discharge instructions reviewed with pt.  Copy of instructions given to pt.  Pt informed her scripts were sent to her pharmacy for pick up.  Pt verbalizes understanding that she knows not to allow anyone to stick her left arm or take blood pressures in left arm.   Pt d/c'd via wheelchair with belongings to discharge lounge. Pt's son will be coming to pick her up             Escorted by staff to d/c lounge.   Lanaysia Fritchman,RN SWOT

## 2022-06-04 NOTE — Discharge Instructions (Signed)
   [facility] of [facility]  Discharge Instructions  AV Fistula or Graft Surgery for Dialysis Access  Please refer to the following instructions for your post-procedure care. Your surgeon or physician assistant will discuss any changes with you.  Activity  You may drive the day following your surgery, if you are comfortable and no longer taking prescription pain medication. Resume full activity as the soreness in your incision resolves.  Bathing/Showering  You may shower after you go home. Keep your incision dry for 48 hours. Do not soak in a bathtub, hot tub, or swim until the incision heals completely.   Incision Care  Clean your incision with mild soap and water after 48 hours. Pat the area dry with a clean towel. You do not need a bandage unless otherwise instructed. Do not apply any ointments or creams to your incision. You may have skin glue on your incision. Do not peel it off. It will come off on its own in about one week. Your arm may swell a bit after surgery. To reduce swelling use pillows to elevate your arm so it is above your heart. Your doctor will tell you if you need to lightly wrap your arm with an ACE bandage.  Diet  Resume your normal diet. There are not special food restrictions following this procedure. In order to heal from your surgery, it is CRITICAL to get adequate nutrition. Your body requires vitamins, minerals, and protein. Vegetables are the best source of vitamins and minerals. Vegetables also provide the perfect balance of protein. Processed food has little nutritional value, so try to avoid this.  Medications  Resume taking all of your medications. If your incision is causing pain, you may take over-the counter pain relievers such as acetaminophen (Tylenol). If you were prescribed a stronger pain medication, please be aware these medications can cause nausea and constipation. Prevent nausea by taking the medication with a snack or meal.  Avoid constipation by drinking plenty of fluids and eating foods with high amount of fiber, such as fruits, vegetables, and grains.  Do not take Tylenol if you are taking prescription pain medications.  Follow up As needed  Please call us immediately for any of the following conditions:  Increased pain, redness, drainage (pus) from your incision site Fever of 101 degrees or higher Severe or worsening pain at your incision site Hand pain or numbness.  Reduce your risk of vascular disease:  Stop smoking. If you would like help, call [facility] at 1-800-QUIT-NOW ((769)158-8602) or [place] at [phone]  Manage your cholesterol Maintain a desired weight Control your diabetes Keep your blood pressure down  Dialysis  Your left arm graft should be ready for use in 4 weeks.   06/04/2022 Lorraine Turner [id number] 1960/06/24  Surgeon(s): Leonie Douglas, MD  Procedure(s): INSERTION OF ARTERIOVENOUS (AV) GORE-TEX GRAFT LEFT ARM   May stick graft immediately   May stick graft on designated area only:   x Do not stick graft for 4 weeks    If you have any questions, please call the office at [phone].

## 2022-06-04 NOTE — Plan of Care (Signed)

## 2022-06-04 NOTE — Progress Notes (Addendum)
  Progress Note    06/04/2022 8:18 AM 1 Day Post-Op  Subjective:  feels good, no pain in left arm or hand    Vitals:   06/04/22 0450 06/04/22 0752  BP: (!) 139/52 (!) 184/57  Pulse: (!) 57 61  Resp:  16  Temp: 98.5 F (36.9 C) 98.4 F (36.9 C)  SpO2: 100% 100%    Physical Exam: Cardiac:  regular Lungs:  nonlabored Incisions:  LUE incisions intact and dry with dressing Extremities:  LUE AVG with palpable thrill, L radial pulse 2+. Grip strength 5/5  CBC    Component Value Date/Time   WBC 6.9 06/03/2022 1505   RBC 2.81 (L) 06/03/2022 1505   HGB 8.2 (L) 06/03/2022 1505   HGB 10.1 (L) 10/21/2019 1531   HCT 24.9 (L) 06/03/2022 1505   HCT 30.5 (L) 10/21/2019 1531   PLT 207 06/03/2022 1505   PLT 270 10/21/2019 1531   MCV 88.6 06/03/2022 1505   MCV 89 10/21/2019 1531   MCH 29.2 06/03/2022 1505   MCHC 32.9 06/03/2022 1505   RDW 13.3 06/03/2022 1505   RDW 13.2 10/21/2019 1531   LYMPHSABS 1.2 02/06/2022 1846   LYMPHSABS 2.1 10/21/2019 1531   MONOABS 1.0 02/06/2022 1846   EOSABS 0.2 02/06/2022 1846   EOSABS 0.2 10/21/2019 1531   BASOSABS 0.0 02/06/2022 1846   BASOSABS 0.0 10/21/2019 1531    BMET    Component Value Date/Time   NA 140 06/03/2022 0749   NA 142 01/24/2020 1441   K 3.9 06/03/2022 0749   CL 108 06/03/2022 0749   CO2 21 (L) 03/02/2022 0652   GLUCOSE 122 (H) 06/03/2022 0749   BUN 56 (H) 06/03/2022 0749   BUN 27 (H) 01/24/2020 1441   CREATININE 5.57 (H) 06/03/2022 1505   CREATININE 0.65 04/09/2016 1150   CALCIUM 8.8 (L) 03/02/2022 0652   GFRNONAA 8 (L) 06/03/2022 1505   GFRNONAA >89 04/09/2016 1150   GFRAA 30 (L) 01/24/2020 1441   GFRAA >89 04/09/2016 1150    INR    Component Value Date/Time   INR 1.0 11/23/2020 1441     Intake/Output Summary (Last 24 hours) at 06/04/2022 0818 Last data filed at 06/03/2022 1549 Gross per 24 hour  Intake 740 ml  Output 20 ml  Net 720 ml      Assessment/Plan:  62 y.o. female is 1 day post op, s/p:  placement of left arm AVG   -Patient tolerated surgery well yesterday. She is s/p placement of left arm AVG for future dialysis needs. This should be ready to cannulate in 4 weeks if necessary -LUE incisions are intact and dry -No signs of steal syndrome. Palpable left radial pulse 2+. No motor or sensory deficits in LUE -Will discharge home today. I will send in some pain medications and a 1 month refill of her amlodipine since she has 1 pill left currently. She can follow up with our office as needed   Loel Dubonnet, PA-C [facility] [phone] 06/04/2022 8:18 AM  VASCULAR STAFF ADDENDUM: I have independently interviewed and examined the patient. I agree with the above.   Rande Brunt. Lenell Antu, MD [facility] of [facility] Phone Number: [phone] 06/04/2022 9:17 AM

## 2022-06-06 ENCOUNTER — Telehealth: Payer: Self-pay

## 2022-06-06 NOTE — Transitions of Care (Post Inpatient/ED Visit) (Signed)
   06/06/2022  Name: RONNELL MAKAREWICZ MRN: [id number] DOB: 07-26-1960  Today's TOC FU Call Status: Today's TOC FU Call Status:: Unsuccessul Call (1st Attempt) Unsuccessful Call (1st Attempt) Date: 06/06/22  Attempted to reach the patient regarding the most recent Inpatient/ED visit.  Follow Up Plan: Additional outreach attempts will be made to reach the patient to complete the Transitions of Care (Post Inpatient/ED visit) call.   Signature Audie Box

## 2022-06-09 NOTE — Discharge Summary (Signed)
Discharge Summary  Patient ID: Lorraine Turner [id number] 62 y.o. September 25, 1960  Admit date: 06/03/2022  Discharge date and time: 06/04/2022  9:39 AM   Admitting Physician: Leonie Douglas, MD   Discharge Physician: Leonie Douglas, MD   Admission Diagnoses: ESRD (end stage renal disease) [facility]) [N18.6]  Discharge Diagnoses: ESRD (end stage renal disease) (HCC) [N18.6]  Admission Condition: good  Discharged Condition: good  Indication for Admission: Lorraine Turner is a 62 y.o. female with ESRD in need of permanent dialysis access. After careful discussion of risks, benefits, and alternatives the patient was offered left arm arteriovenous graft. The patient understood and wished to proceed.   Hospital Course: The patient was admitted to the hospital on 06/03/2022 and underwent creation of left arm arteriovenous graft. She tolerated the procedure well and was transferred to the PACU in stable condition.   By POD1, the patient was ambulating and tolerating a solid diet without difficulty. Her left arm graft had a good thrill in it and she had no steal symptoms. She was discharged home.  Consults: None  Treatments: IV hydration, antibiotics: Ancef, and analgesia: acetaminophen  Discharge Exam: See progress note  Vitals:   06/04/22 0450 06/04/22 0752  BP: (!) 139/52 (!) 184/57  Pulse: (!) 57 61  Resp:  16  Temp: 98.5 F (36.9 C) 98.4 F (36.9 C)  SpO2: 100% 100%     Disposition: Discharge disposition: 01-Home or Self Care       Patient Instructions:  Allergies as of 06/04/2022       Reactions   Lisinopril Other (See Comments)   Patient does not wish to take this medication (she heard that it can make your kidneys worse)   Amoxicillin Rash        Medication List     TAKE these medications    amLODipine 10 MG tablet Commonly known as: NORVASC Take 1 tablet (10 mg total) by mouth daily.   carvedilol 6.25 MG tablet Commonly known as: COREG Take 1 tablet  (6.25 mg total) by mouth 2 (two) times daily with a meal.   fluticasone 50 MCG/ACT nasal spray Commonly known as: FLONASE Place 1 spray into both nostrils daily.   hydrALAZINE 25 MG tablet Commonly known as: APRESOLINE Take 25 mg by mouth 3 (three) times daily.   oxyCODONE-acetaminophen 5-325 MG tablet Commonly known as: PERCOCET/ROXICET Take 1 tablet by mouth every 6 (six) hours as needed for severe pain.   QUEtiapine 50 MG tablet Commonly known as: SEROQUEL Take 1 tablet (50 mg total) by mouth at bedtime.   valsartan 320 MG tablet Commonly known as: DIOVAN Take 1 tablet (320 mg total) by mouth daily.               Discharge Care Instructions  (From admission, onward)           Start     Ordered   06/04/22 0000  Discharge wound care:       Comments: Take the bandages off your incisions in two days. Once the bandages are removed, please clean your incisions once daily with antibacterial soap and water, then pat dry. You may shower.   06/04/22 0827           Activity: activity as tolerated, no driving while on analgesics, and no heavy lifting for 2 weeks Diet: low fat, low cholesterol diet and renal diet Wound Care: keep wound clean and dry  Follow-up with [facility] as needed  Signed: Brayden Betters Sharin Mons,  PA-C 06/09/2022 7:47 PM

## 2022-06-10 ENCOUNTER — Telehealth: Payer: Self-pay

## 2022-06-10 NOTE — Transitions of Care (Post Inpatient/ED Visit) (Signed)
   06/10/2022  Name: Lorraine Turner MRN: [id number] DOB: [birth year]  Today's TOC FU Call Status: Today's TOC FU Call Status:: Unsuccessful Call (2nd Attempt) Unsuccessful Call (1st Attempt) Date: 06/06/22 Unsuccessful Call (2nd Attempt) Date: 06/10/22  Attempted to reach the patient regarding the most recent Inpatient/ED visit.  Follow Up Plan: Additional outreach attempts will be made to reach the patient to complete the Transitions of Care (Post Inpatient/ED visit) call.   Signature   Robyne Peers, RN

## 2022-06-11 ENCOUNTER — Telehealth: Payer: Self-pay

## 2022-06-11 NOTE — Transitions of Care (Post Inpatient/ED Visit) (Signed)
   06/11/2022  Name: Lorraine Turner MRN: [id number] DOB: 03-05-1960  Today's TOC FU Call Status: Today's TOC FU Call Status:: Unsuccessful Call (3rd Attempt) Unsuccessful Call (1st Attempt) Date: 06/06/22 Unsuccessful Call (2nd Attempt) Date: 06/10/22 Unsuccessful Call (3rd Attempt) Date: 06/11/22  Attempted to reach the patient regarding the most recent Inpatient/ED visit.  Follow Up Plan: No further outreach attempts will be made at this time. We have been unable to contact the patient.  Letter sent to patient requesting she contact [facility] to schedule an appointment as we have not been able to reach her.   Signature  Audie Box

## 2022-07-11 LAB — LAB REPORT - SCANNED: EGFR: 8

## 2022-07-22 ENCOUNTER — Emergency Department (HOSPITAL_COMMUNITY)
Admission: EM | Admit: 2022-07-22 | Discharge: 2022-07-22 | Disposition: A | Discharge status: Home / Self Care | Payer: Medicaid Other | Attending: Emergency Medicine | Admitting: Emergency Medicine

## 2022-07-22 ENCOUNTER — Other Ambulatory Visit: Payer: Self-pay

## 2022-07-22 DIAGNOSIS — I120 Hypertensive chronic kidney disease with stage 5 chronic kidney disease or end stage renal disease: Secondary | ICD-10-CM | POA: Diagnosis not present

## 2022-07-22 DIAGNOSIS — E1122 Type 2 diabetes mellitus with diabetic chronic kidney disease: Secondary | ICD-10-CM | POA: Insufficient documentation

## 2022-07-22 DIAGNOSIS — Z79899 Other long term (current) drug therapy: Secondary | ICD-10-CM | POA: Insufficient documentation

## 2022-07-22 DIAGNOSIS — Z992 Dependence on renal dialysis: Secondary | ICD-10-CM | POA: Diagnosis not present

## 2022-07-22 DIAGNOSIS — N186 End stage renal disease: Secondary | ICD-10-CM | POA: Diagnosis not present

## 2022-07-22 DIAGNOSIS — R1110 Vomiting, unspecified: Secondary | ICD-10-CM | POA: Diagnosis present

## 2022-07-22 DIAGNOSIS — K529 Noninfective gastroenteritis and colitis, unspecified: Secondary | ICD-10-CM | POA: Insufficient documentation

## 2022-07-22 LAB — URINALYSIS, ROUTINE W REFLEX MICROSCOPIC
Bilirubin Urine: NEGATIVE
Glucose, UA: NEGATIVE mg/dL
Ketones, ur: NEGATIVE mg/dL
Leukocytes,Ua: NEGATIVE
Nitrite: NEGATIVE
Protein, ur: 100 mg/dL — AB
Specific Gravity, Urine: 1.012 (ref 1.005–1.030)
pH: 5 (ref 5.0–8.0)

## 2022-07-22 LAB — CBC WITH DIFFERENTIAL/PLATELET
Abs Immature Granulocytes: 0.02 10*3/uL (ref 0.00–0.07)
Basophils Absolute: 0 10*3/uL (ref 0.0–0.1)
Basophils Relative: 0 %
Eosinophils Absolute: 0.1 10*3/uL (ref 0.0–0.5)
Eosinophils Relative: 2 %
HCT: 29 % — ABNORMAL LOW (ref 36.0–46.0)
Hemoglobin: 9.3 g/dL — ABNORMAL LOW (ref 12.0–15.0)
Immature Granulocytes: 0 %
Lymphocytes Relative: 7 %
Lymphs Abs: 0.5 10*3/uL — ABNORMAL LOW (ref 0.7–4.0)
MCH: 30.1 pg (ref 26.0–34.0)
MCHC: 32.1 g/dL (ref 30.0–36.0)
MCV: 93.9 fL (ref 80.0–100.0)
Monocytes Absolute: 0.6 10*3/uL (ref 0.1–1.0)
Monocytes Relative: 9 %
Neutro Abs: 5.3 10*3/uL (ref 1.7–7.7)
Neutrophils Relative %: 82 %
Platelets: 191 10*3/uL (ref 150–400)
RBC: 3.09 MIL/uL — ABNORMAL LOW (ref 3.87–5.11)
RDW: 13.9 % (ref 11.5–15.5)
WBC: 6.4 10*3/uL (ref 4.0–10.5)
nRBC: 0 % (ref 0.0–0.2)

## 2022-07-22 LAB — COMPREHENSIVE METABOLIC PANEL
ALT: 31 U/L (ref 0–44)
AST: 19 U/L (ref 15–41)
Albumin: 4.1 g/dL (ref 3.5–5.0)
Alkaline Phosphatase: 71 U/L (ref 38–126)
Anion gap: 12 (ref 5–15)
BUN: 57 mg/dL — ABNORMAL HIGH (ref 8–23)
CO2: 18 mmol/L — ABNORMAL LOW (ref 22–32)
Calcium: 9.1 mg/dL (ref 8.9–10.3)
Chloride: 108 mmol/L (ref 98–111)
Creatinine, Ser: 5.42 mg/dL — ABNORMAL HIGH (ref 0.44–1.00)
GFR, Estimated: 8 mL/min — ABNORMAL LOW (ref >60)
Glucose, Bld: 95 mg/dL (ref 70–99)
Potassium: 4.3 mmol/L (ref 3.5–5.1)
Sodium: 138 mmol/L (ref 135–145)
Total Bilirubin: 1.4 mg/dL — ABNORMAL HIGH (ref 0.3–1.2)
Total Protein: 7.5 g/dL (ref 6.5–8.1)

## 2022-07-22 LAB — LIPASE, BLOOD: Lipase: 27 U/L (ref 11–51)

## 2022-07-22 LAB — CBG MONITORING, ED: Glucose-Capillary: 95 mg/dL (ref 70–99)

## 2022-07-22 MED ORDER — DICYCLOMINE HCL 10 MG PO CAPS
10.0000 mg | ORAL_CAPSULE | Freq: Once | ORAL | Status: AC
  Administered 2022-07-22: 10 mg via ORAL
  Filled 2022-07-22: qty 1

## 2022-07-22 MED ORDER — SODIUM CHLORIDE 0.9 % IV BOLUS
500.0000 mL | Freq: Once | INTRAVENOUS | Status: AC
  Administered 2022-07-22: 500 mL via INTRAVENOUS

## 2022-07-22 MED ORDER — SODIUM CHLORIDE 0.9 % IV BOLUS: 1000.0000 mL | Freq: Once | INTRAVENOUS | Status: DC

## 2022-07-22 MED ORDER — LOPERAMIDE HCL 2 MG PO CAPS: 2.0000 mg | ORAL_CAPSULE | Freq: Four times a day (QID) | ORAL | 0 refills | Status: DC | PRN

## 2022-07-22 MED ORDER — ONDANSETRON HCL 4 MG PO TABS: 4.0000 mg | ORAL_TABLET | Freq: Four times a day (QID) | ORAL | 0 refills | Status: DC

## 2022-07-22 MED ORDER — ONDANSETRON HCL 4 MG/2ML IJ SOLN
4.0000 mg | Freq: Once | INTRAMUSCULAR | Status: AC
  Administered 2022-07-22: 4 mg via INTRAVENOUS
  Filled 2022-07-22: qty 2

## 2022-07-22 MED ORDER — AMLODIPINE BESYLATE 10 MG PO TABS: 10.0000 mg | ORAL_TABLET | Freq: Every day | ORAL | 0 refills | Status: DC

## 2022-07-22 MED ORDER — LOPERAMIDE HCL 2 MG PO CAPS
4.0000 mg | ORAL_CAPSULE | Freq: Once | ORAL | Status: AC
  Administered 2022-07-22: 4 mg via ORAL
  Filled 2022-07-22: qty 2

## 2022-07-22 NOTE — Discharge Instructions (Signed)
You were seen in the emergency department today for diarrhea. I have prescribed you a few medications. The first is Loperamide (Imodium) for diarrhea. Additionally, I am prescribing Zofran (ondansetron) for nausea and vomiting. I have also prescribed your amlodipine for you blood pressure. Please return to the ER if symptoms worsen or you are unable to keep liquids down.

## 2022-07-22 NOTE — ED Triage Notes (Signed)
Patient brought in from home by EMS. Patient states that she went to a BBQ on Saturday night and she feels like the meat she ate was undercooked. She states she has had nausea and vomiting since Saturday night. Patient is alert and aware x4. She does have history if diabetes that is under control per patient. And history of renal HTN.

## 2022-07-22 NOTE — ED Notes (Signed)
Pt asked to provide urine sample. Pt is a dialysis pt and sometimes produces urine. Pt attempting to provide sample at this time.

## 2022-07-22 NOTE — ED Provider Notes (Signed)
[place] EMERGENCY DEPARTMENT AT [facility] Provider Note   CSN: [id number] Arrival date & time: 07/22/22  4098     History  Chief Complaint  Patient presents with   Emesis   Nausea    Lorraine Turner is a 62 y.o. female.  With past medical history of hypertension, hyperlipidemia, diabetes, bipolar, end-stage renal disease now on hemodialysis who presents to the emergency department with vomiting and diarrhea.  Patient states that she has had nausea, vomiting and diarrhea since yesterday.  States on Saturday afternoon she ate hamburger at a cookout.  Unsure if the meat was cooked through.  States that her stomach felt somewhat uncomfortable Saturday night into Sunday.  On Sunday had Dione Plover.  She states that just after eating Dione Plover she began having abdominal cramping and diarrhea.  States that she had watery diarrhea about 10 times yesterday into today.  Denies bloody diarrhea.  She also had 1 episode of nonbloody emesis.  States that symptoms have slowed into this morning but was unable to drink any Pedialyte or keep anything down yesterday and feels generally tired.  She denies having abdominal pain, fevers, dysuria.  No previous abdominal surgeries.  No recent antibiotics.  Emesis Associated symptoms: diarrhea        Home Medications Prior to Admission medications   Medication Sig Start Date End Date Taking? Authorizing Provider  loperamide (IMODIUM) 2 MG capsule Take 1 capsule (2 mg total) by mouth 4 (four) times daily as needed for diarrhea or loose stools. 07/22/22  Yes Cristopher Peru, PA-C  ondansetron (ZOFRAN) 4 MG tablet Take 1 tablet (4 mg total) by mouth every 6 (six) hours. 07/22/22  Yes Cristopher Peru, PA-C  amLODipine (NORVASC) 10 MG tablet Take 1 tablet (10 mg total) by mouth daily. 07/22/22 08/21/22  Cristopher Peru, PA-C  carvedilol (COREG) 6.25 MG tablet Take 1 tablet (6.25 mg total) by mouth 2 (two) times daily with a meal. Patient not taking:  Reported on 06/04/2022 02/25/22   Hoy Register, MD  fluticasone (FLONASE) 50 MCG/ACT nasal spray Place 1 spray into both nostrils daily. 10/16/21 10/16/22  Steffanie Rainwater, MD  hydrALAZINE (APRESOLINE) 25 MG tablet Take 25 mg by mouth 3 (three) times daily. 04/17/22   [provider]  oxyCODONE-acetaminophen (PERCOCET/ROXICET) 5-325 MG tablet Take 1 tablet by mouth every 6 (six) hours as needed for severe pain. 06/04/22   Schuh, McKenzi P, PA-C  QUEtiapine (SEROQUEL) 50 MG tablet Take 1 tablet (50 mg total) by mouth at bedtime. Patient not taking: Reported on 06/04/2022 02/13/22   Karoline Caldwell, MD  valsartan (DIOVAN) 320 MG tablet Take 1 tablet (320 mg total) by mouth daily. 04/03/22   Hoy Register, MD      Allergies    Lisinopril and Amoxicillin    Review of Systems   Review of Systems  Constitutional:  Positive for fatigue.  Gastrointestinal:  Positive for diarrhea, nausea and vomiting.  All other systems reviewed and are negative.   Physical Exam Updated Vital Signs BP (!) 189/76 (BP Location: Right Arm)   Pulse 67   Temp 98.9 F (37.2 C) (Oral)   Resp (!) 22   SpO2 100%  Physical Exam Vitals and nursing note reviewed.  Constitutional:      General: She is not in acute distress.    Appearance: Normal appearance. She is ill-appearing. She is not toxic-appearing.  HENT:     Head: Normocephalic.     Mouth/Throat:  Mouth: Mucous membranes are dry.     Pharynx: Oropharynx is clear.  Eyes:     General: No scleral icterus.    Extraocular Movements: Extraocular movements intact.  Cardiovascular:     Rate and Rhythm: Normal rate and regular rhythm.     Pulses: Normal pulses.     Heart sounds: No murmur heard. Pulmonary:     Effort: Pulmonary effort is normal. No respiratory distress.     Breath sounds: Normal breath sounds.  Abdominal:     General: Bowel sounds are normal. There is no distension.     Palpations: Abdomen is soft.     Tenderness: There is no  abdominal tenderness.  Skin:    General: Skin is warm and dry.     Capillary Refill: Capillary refill takes less than 2 seconds.  Neurological:     General: No focal deficit present.     Mental Status: She is alert and oriented to person, place, and time.  Psychiatric:        Mood and Affect: Mood normal.        Behavior: Behavior normal.        Thought Content: Thought content normal.        Judgment: Judgment normal.     ED Results / Procedures / Treatments   Labs (all labs ordered are listed, but only abnormal results are displayed) Labs Reviewed  COMPREHENSIVE METABOLIC PANEL - Abnormal; Notable for the following components:      Result Value   CO2 18 (*)    BUN 57 (*)    Creatinine, Ser 5.42 (*)    Total Bilirubin 1.4 (*)    GFR, Estimated 8 (*)    All other components within normal limits  URINALYSIS, ROUTINE W REFLEX MICROSCOPIC - Abnormal; Notable for the following components:   Hgb urine dipstick SMALL (*)    Protein, ur 100 (*)    Bacteria, UA RARE (*)    All other components within normal limits  CBC WITH DIFFERENTIAL/PLATELET - Abnormal; Notable for the following components:   RBC 3.09 (*)    Hemoglobin 9.3 (*)    HCT 29.0 (*)    Lymphs Abs 0.5 (*)    All other components within normal limits  C DIFFICILE QUICK SCREEN W PCR REFLEX    GASTROINTESTINAL PANEL BY PCR, STOOL (REPLACES STOOL CULTURE)  LIPASE, BLOOD  CBC WITH DIFFERENTIAL/PLATELET  CBG MONITORING, ED    EKG None  Radiology No results found.  Procedures Procedures   Medications Ordered in ED Medications  dicyclomine (BENTYL) capsule 10 mg (10 mg Oral Given 07/22/22 1055)  sodium chloride 0.9 % bolus 500 mL (0 mLs Intravenous Stopped 07/22/22 1252)  loperamide (IMODIUM) capsule 4 mg (4 mg Oral Given 07/22/22 1314)  ondansetron (ZOFRAN) injection 4 mg (4 mg Intravenous Given 07/22/22 1315)  sodium chloride 0.9 % bolus 500 mL (0 mLs Intravenous Stopped 07/22/22 1446)    ED Course/ Medical  Decision Making/ A&P Clinical Course as of 07/22/22 1516  Mon Jul 22, 2022  1305 Pt feels her diarrhea is worsening. Will dose with loperamide. Will give zofran. Adding on cdiff pcr and gipp since she continues to have diarrhea.  [LA]  1341 No shortness of breath or FVO symptoms with first ,will give 2nd [LA]    Clinical Course User Index [LA] Cristopher Peru, PA-C   { Medical Decision Making Amount and/or Complexity of Data Reviewed Labs: ordered.  Risk Prescription drug management.  Initial  Impression and Ddx 62 year old female who presents to the emergency department with vomiting and diarrhea Patient PMH that increases complexity of ED encounter:   hypertension, hyperlipidemia, diabetes, bipolar, end-stage renal disease Differential: Obstruction, viral gastroenteritis, colitis, diverticulitis, DKA, electrolyte dysfunction, acute hepatobiliary disease, etc.  Interpretation of Diagnostics I independent reviewed and interpreted the labs as followed: CBC with stable anemia, CMP with known CKD, UA negative, lipase negative, glucose wnl  - I independently visualized the following imaging with scope of interpretation limited to determining acute life threatening conditions related to emergency care: not indicated   Patient Reassessment and Ultimate Disposition/Management 62 year old female who presents with vomiting and diarrhea.  She is overall nonseptic and nontoxic in appearance.  She appears generally ill, slow-moving. From her presentation of symptoms, most likely is a viral gastroenteritis.  I will obtain labs including a CMP, lipase, urine, CBC, glucose.  Give her 500 mL of normal saline given that she is end-stage renal disease on hemodialysis to not fluid volume overload her.  Will also give her Bentyl and reassess.  Continued to have episodes of diarrhea after IVF, bentyl. Redosed with Loperamide, zofran.  After IVF she does not have evidence of FVO. Giving  more IVF.  Labs do not appear to show dehydration or infection. Lipase is negative. No evidence of UTI.  I attempted to collect stool sample but did not have any further episodes of diarrhea after 6 hours.  No evidence of acute hepatobiliary disease, pancreatitis. Symptoms inconsistent with SBO. No evidence of DKA.   Likely gastrointeritis from food source eaten Sunday. Her abdomen is soft and not-peritonitic. I do not feel she requires advanced imaging at this time. She is well appearing. She feels improved after IVF and meds on reassessment.  Able to tolerate PO here in ED Will discharge with Zofran, loperamide. She requested refill of her amlodipine which I provided. Given strict return precautions for inability to tolerate PO or continued diarrhea and vomiting.   The patient has been appropriately medically screened and/or stabilized in the ED. I have low suspicion for any other emergent medical condition which would require further screening, evaluation or treatment in the ED or require inpatient management. At time of discharge the patient is hemodynamically stable and in no acute distress. I have discussed work-up results and diagnosis with patient and answered all questions. Patient is agreeable with discharge plan. We discussed strict return precautions for returning to the emergency department and they verbalized understanding.     Patient management required discussion with the following services or consulting groups:  None  Complexity of Problems Addressed Acute complicated illness or Injury  Additional Data Reviewed and Analyzed Further history obtained from: Past medical history and medications listed in the EMR, Prior ED visit notes, and Care Everywhere  Patient Encounter Risk Assessment Prescriptions and SDOH impact on management  Final Clinical Impression(s) / ED Diagnoses Final diagnoses:  Gastroenteritis    Rx / DC Orders ED Discharge Orders          Ordered     amLODipine (NORVASC) 10 MG tablet  Daily        07/22/22 1432    loperamide (IMODIUM) 2 MG capsule  4 times daily PRN        07/22/22 1432    ondansetron (ZOFRAN) 4 MG tablet  Every 6 hours        06 /10/24 1432              Cristopher Peru, PA-C 07/22/22  1521    Ernie Avena, MD 07/22/22 1805

## 2022-07-25 ENCOUNTER — Other Ambulatory Visit (HOSPITAL_COMMUNITY): Payer: Self-pay | Admitting: *Deleted

## 2022-07-29 ENCOUNTER — Encounter (HOSPITAL_COMMUNITY)
Admission: RE | Admit: 2022-07-29 | Discharge: 2022-07-29 | Disposition: A | Discharge status: Home / Self Care | Payer: Medicaid Other | Source: Ambulatory Visit | Attending: Internal Medicine | Admitting: Internal Medicine

## 2022-07-29 DIAGNOSIS — N189 Chronic kidney disease, unspecified: Secondary | ICD-10-CM | POA: Diagnosis present

## 2022-07-29 DIAGNOSIS — D631 Anemia in chronic kidney disease: Secondary | ICD-10-CM | POA: Diagnosis not present

## 2022-07-29 MED ORDER — SODIUM CHLORIDE 0.9 % IV SOLN
510.0000 mg | INTRAVENOUS | Status: DC
  Administered 2022-07-29: 510 mg via INTRAVENOUS
  Filled 2022-07-29: qty 510

## 2022-08-05 ENCOUNTER — Encounter (HOSPITAL_COMMUNITY): Payer: Medicaid Other

## 2022-12-03 ENCOUNTER — Inpatient Hospital Stay (HOSPITAL_COMMUNITY)
Admission: EM | Admit: 2022-12-03 | Discharge: 2022-12-06 | DRG: 291 | Disposition: A | Discharge status: Home / Self Care | Payer: MEDICAID | Attending: Internal Medicine | Admitting: Internal Medicine

## 2022-12-03 ENCOUNTER — Encounter (HOSPITAL_COMMUNITY): Payer: Self-pay

## 2022-12-03 ENCOUNTER — Emergency Department (HOSPITAL_COMMUNITY): Payer: MEDICAID

## 2022-12-03 ENCOUNTER — Other Ambulatory Visit: Payer: Self-pay

## 2022-12-03 DIAGNOSIS — I132 Hypertensive heart and chronic kidney disease with heart failure and with stage 5 chronic kidney disease, or end stage renal disease: Principal | ICD-10-CM | POA: Diagnosis present

## 2022-12-03 DIAGNOSIS — Z6822 Body mass index (BMI) 22.0-22.9, adult: Secondary | ICD-10-CM | POA: Diagnosis not present

## 2022-12-03 DIAGNOSIS — E1122 Type 2 diabetes mellitus with diabetic chronic kidney disease: Secondary | ICD-10-CM | POA: Diagnosis present

## 2022-12-03 DIAGNOSIS — F259 Schizoaffective disorder, unspecified: Secondary | ICD-10-CM | POA: Diagnosis present

## 2022-12-03 DIAGNOSIS — Z888 Allergy status to other drugs, medicaments and biological substances status: Secondary | ICD-10-CM | POA: Diagnosis not present

## 2022-12-03 DIAGNOSIS — Z823 Family history of stroke: Secondary | ICD-10-CM | POA: Diagnosis not present

## 2022-12-03 DIAGNOSIS — Z83438 Family history of other disorder of lipoprotein metabolism and other lipidemia: Secondary | ICD-10-CM

## 2022-12-03 DIAGNOSIS — I5031 Acute diastolic (congestive) heart failure: Secondary | ICD-10-CM | POA: Diagnosis not present

## 2022-12-03 DIAGNOSIS — Z880 Allergy status to penicillin: Secondary | ICD-10-CM

## 2022-12-03 DIAGNOSIS — F419 Anxiety disorder, unspecified: Secondary | ICD-10-CM | POA: Diagnosis present

## 2022-12-03 DIAGNOSIS — I160 Hypertensive urgency: Secondary | ICD-10-CM | POA: Diagnosis present

## 2022-12-03 DIAGNOSIS — I509 Heart failure, unspecified: Secondary | ICD-10-CM | POA: Diagnosis present

## 2022-12-03 DIAGNOSIS — I161 Hypertensive emergency: Principal | ICD-10-CM | POA: Diagnosis present

## 2022-12-03 DIAGNOSIS — D631 Anemia in chronic kidney disease: Secondary | ICD-10-CM | POA: Diagnosis present

## 2022-12-03 DIAGNOSIS — Z9151 Personal history of suicidal behavior: Secondary | ICD-10-CM | POA: Diagnosis not present

## 2022-12-03 DIAGNOSIS — I5030 Unspecified diastolic (congestive) heart failure: Secondary | ICD-10-CM | POA: Diagnosis present

## 2022-12-03 DIAGNOSIS — E440 Moderate protein-calorie malnutrition: Secondary | ICD-10-CM | POA: Diagnosis present

## 2022-12-03 DIAGNOSIS — R45851 Suicidal ideations: Secondary | ICD-10-CM

## 2022-12-03 DIAGNOSIS — N185 Chronic kidney disease, stage 5: Secondary | ICD-10-CM | POA: Diagnosis present

## 2022-12-03 DIAGNOSIS — F319 Bipolar disorder, unspecified: Secondary | ICD-10-CM | POA: Diagnosis present

## 2022-12-03 DIAGNOSIS — E1165 Type 2 diabetes mellitus with hyperglycemia: Secondary | ICD-10-CM | POA: Diagnosis present

## 2022-12-03 DIAGNOSIS — I5033 Acute on chronic diastolic (congestive) heart failure: Secondary | ICD-10-CM | POA: Diagnosis present

## 2022-12-03 DIAGNOSIS — E785 Hyperlipidemia, unspecified: Secondary | ICD-10-CM | POA: Diagnosis present

## 2022-12-03 DIAGNOSIS — Z87891 Personal history of nicotine dependence: Secondary | ICD-10-CM | POA: Diagnosis not present

## 2022-12-03 DIAGNOSIS — Z833 Family history of diabetes mellitus: Secondary | ICD-10-CM

## 2022-12-03 DIAGNOSIS — F32A Depression, unspecified: Secondary | ICD-10-CM | POA: Diagnosis present

## 2022-12-03 DIAGNOSIS — Z841 Family history of disorders of kidney and ureter: Secondary | ICD-10-CM | POA: Diagnosis not present

## 2022-12-03 DIAGNOSIS — N250 Renal osteodystrophy: Secondary | ICD-10-CM | POA: Diagnosis present

## 2022-12-03 DIAGNOSIS — Z79899 Other long term (current) drug therapy: Secondary | ICD-10-CM

## 2022-12-03 DIAGNOSIS — Z5900 Homelessness unspecified: Secondary | ICD-10-CM

## 2022-12-03 DIAGNOSIS — Z8249 Family history of ischemic heart disease and other diseases of the circulatory system: Secondary | ICD-10-CM | POA: Diagnosis not present

## 2022-12-03 DIAGNOSIS — Z91148 Patient's other noncompliance with medication regimen for other reason: Secondary | ICD-10-CM

## 2022-12-03 LAB — BASIC METABOLIC PANEL
Anion gap: 11 (ref 5–15)
BUN: 62 mg/dL — ABNORMAL HIGH (ref 8–23)
CO2: 23 mmol/L (ref 22–32)
Calcium: 9 mg/dL (ref 8.9–10.3)
Chloride: 106 mmol/L (ref 98–111)
Creatinine, Ser: 5.1 mg/dL — ABNORMAL HIGH (ref 0.44–1.00)
GFR, Estimated: 9 mL/min — ABNORMAL LOW (ref >60)
Glucose, Bld: 152 mg/dL — ABNORMAL HIGH (ref 70–99)
Potassium: 4.1 mmol/L (ref 3.5–5.1)
Sodium: 140 mmol/L (ref 135–145)

## 2022-12-03 LAB — CBC
HCT: 31.7 % — ABNORMAL LOW (ref 36.0–46.0)
Hemoglobin: 10.2 g/dL — ABNORMAL LOW (ref 12.0–15.0)
MCH: 29.7 pg (ref 26.0–34.0)
MCHC: 32.2 g/dL (ref 30.0–36.0)
MCV: 92.4 fL (ref 80.0–100.0)
Platelets: 190 10*3/uL (ref 150–400)
RBC: 3.43 MIL/uL — ABNORMAL LOW (ref 3.87–5.11)
RDW: 13.7 % (ref 11.5–15.5)
WBC: 5.3 10*3/uL (ref 4.0–10.5)
nRBC: 0 % (ref 0.0–0.2)

## 2022-12-03 LAB — BRAIN NATRIURETIC PEPTIDE: B Natriuretic Peptide: 1829.6 pg/mL — ABNORMAL HIGH (ref 0.0–100.0)

## 2022-12-03 LAB — HIV ANTIBODY (ROUTINE TESTING W REFLEX): HIV Screen 4th Generation wRfx: NONREACTIVE

## 2022-12-03 LAB — TROPONIN I (HIGH SENSITIVITY)
Troponin I (High Sensitivity): 33 ng/L — ABNORMAL HIGH (ref <18)
Troponin I (High Sensitivity): 33 ng/L — ABNORMAL HIGH (ref <18)

## 2022-12-03 MED ORDER — ALBUTEROL SULFATE (2.5 MG/3ML) 0.083% IN NEBU: 2.5000 mg | INHALATION_SOLUTION | RESPIRATORY_TRACT | Status: DC | PRN

## 2022-12-03 MED ORDER — ENSURE ENLIVE PO LIQD
237.0000 mL | Freq: Two times a day (BID) | ORAL | Status: DC
  Administered 2022-12-03 – 2022-12-06 (×6): 237 mL via ORAL

## 2022-12-03 MED ORDER — HEPARIN SODIUM (PORCINE) 5000 UNIT/ML IJ SOLN
5000.0000 [IU] | Freq: Three times a day (TID) | INTRAMUSCULAR | Status: DC
  Administered 2022-12-03 – 2022-12-06 (×9): 5000 [IU] via SUBCUTANEOUS
  Filled 2022-12-03 (×10): qty 1

## 2022-12-03 MED ORDER — ACETAMINOPHEN 325 MG PO TABS: 650.0000 mg | ORAL_TABLET | Freq: Four times a day (QID) | ORAL | Status: DC | PRN

## 2022-12-03 MED ORDER — FUROSEMIDE 10 MG/ML IJ SOLN
40.0000 mg | Freq: Two times a day (BID) | INTRAMUSCULAR | Status: DC
  Administered 2022-12-04 (×2): 40 mg via INTRAVENOUS
  Filled 2022-12-03 (×2): qty 4

## 2022-12-03 MED ORDER — ONDANSETRON HCL 4 MG PO TABS: 4.0000 mg | ORAL_TABLET | Freq: Four times a day (QID) | ORAL | Status: DC | PRN

## 2022-12-03 MED ORDER — HYDRALAZINE HCL 20 MG/ML IJ SOLN
10.0000 mg | INTRAMUSCULAR | Status: DC | PRN
  Administered 2022-12-03: 10 mg via INTRAVENOUS
  Filled 2022-12-03: qty 1

## 2022-12-03 MED ORDER — AMLODIPINE BESYLATE 10 MG PO TABS
10.0000 mg | ORAL_TABLET | Freq: Every day | ORAL | Status: DC
  Administered 2022-12-03 – 2022-12-06 (×4): 10 mg via ORAL
  Filled 2022-12-03: qty 2
  Filled 2022-12-03 (×3): qty 1

## 2022-12-03 MED ORDER — SODIUM CHLORIDE 0.9% FLUSH
3.0000 mL | Freq: Two times a day (BID) | INTRAVENOUS | Status: DC
Start: 2022-12-03 — End: 2022-12-06
  Administered 2022-12-03 – 2022-12-06 (×6): 3 mL via INTRAVENOUS

## 2022-12-03 MED ORDER — ENSURE ENLIVE PO LIQD: 237.0000 mL | Freq: Two times a day (BID) | ORAL | Status: DC

## 2022-12-03 MED ORDER — NITROGLYCERIN IN D5W 200-5 MCG/ML-% IV SOLN
0.0000 ug/min | INTRAVENOUS | Status: DC
  Administered 2022-12-03: 5 ug/min via INTRAVENOUS
  Filled 2022-12-03: qty 250

## 2022-12-03 MED ORDER — ONDANSETRON HCL 4 MG/2ML IJ SOLN
4.0000 mg | Freq: Four times a day (QID) | INTRAMUSCULAR | Status: DC | PRN
  Administered 2022-12-03: 4 mg via INTRAVENOUS
  Filled 2022-12-03: qty 2

## 2022-12-03 MED ORDER — ACETAMINOPHEN 650 MG RE SUPP: 650.0000 mg | Freq: Four times a day (QID) | RECTAL | Status: DC | PRN

## 2022-12-03 MED ORDER — FUROSEMIDE 10 MG/ML IJ SOLN
60.0000 mg | Freq: Once | INTRAMUSCULAR | Status: AC
  Administered 2022-12-03: 60 mg via INTRAVENOUS
  Filled 2022-12-03: qty 6

## 2022-12-03 MED ORDER — HYDRALAZINE HCL 25 MG PO TABS
25.0000 mg | ORAL_TABLET | Freq: Three times a day (TID) | ORAL | Status: DC
  Administered 2022-12-03 – 2022-12-06 (×10): 25 mg via ORAL
  Filled 2022-12-03 (×10): qty 1

## 2022-12-03 NOTE — ED Triage Notes (Addendum)
Patient arrived by [facility] with complaint of sob with exertion and not taking daily meds for months. Patient also complains of BP being high and eating large amounts of salty foods for several days. Patient has had some episodes of depression but feeling better and here to get back on meds
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
## 2022-12-03 NOTE — ED Provider Notes (Signed)
[place] EMERGENCY DEPARTMENT AT [facility] Provider Note   CSN: [id number] Arrival date & time: 12/03/22  1218     History  No chief complaint on file.   Lorraine Turner is a 62 y.o. female.  62 year old female with past medical history significant for hypertension presents today for concern of elevated blood pressure.  Reports medication noncompliance.  Endorses exertional dyspnea.  She denies chest pain, lightheadedness, palpitations.  The history is provided by the patient. No language interpreter was used.       Home Medications Prior to Admission medications   Medication Sig Start Date End Date Taking? Authorizing Provider  amLODipine (NORVASC) 10 MG tablet Take 1 tablet (10 mg total) by mouth daily. 07/22/22 08/21/22  Cristopher Peru, PA-C  carvedilol (COREG) 6.25 MG tablet Take 1 tablet (6.25 mg total) by mouth 2 (two) times daily with a meal. Patient not taking: Reported on 06/04/2022 02/25/22   Hoy Register, MD  fluticasone (FLONASE) 50 MCG/ACT nasal spray Place 1 spray into both nostrils daily. 10/16/21 10/16/22  Steffanie Rainwater, MD  hydrALAZINE (APRESOLINE) 25 MG tablet Take 25 mg by mouth 3 (three) times daily. 04/17/22   [provider]  loperamide (IMODIUM) 2 MG capsule Take 1 capsule (2 mg total) by mouth 4 (four) times daily as needed for diarrhea or loose stools. 07/22/22   Cristopher Peru, PA-C  ondansetron (ZOFRAN) 4 MG tablet Take 1 tablet (4 mg total) by mouth every 6 (six) hours. 07/22/22   Cristopher Peru, PA-C  oxyCODONE-acetaminophen (PERCOCET/ROXICET) 5-325 MG tablet Take 1 tablet by mouth every 6 (six) hours as needed for severe pain. 06/04/22   Schuh, McKenzi P, PA-C  QUEtiapine (SEROQUEL) 50 MG tablet Take 1 tablet (50 mg total) by mouth at bedtime. Patient not taking: Reported on 06/04/2022 02/13/22   Karoline Caldwell, MD  valsartan (DIOVAN) 320 MG tablet Take 1 tablet (320 mg total) by mouth daily. 04/03/22   Hoy Register, MD       Allergies    Lisinopril and Amoxicillin    Review of Systems   Review of Systems  Constitutional:  Negative for chills and fever.  Respiratory:  Positive for shortness of breath.   Cardiovascular:  Negative for chest pain, palpitations and leg swelling.  Gastrointestinal:  Negative for abdominal pain.  Neurological:  Negative for light-headedness and headaches.  All other systems reviewed and are negative.   Physical Exam Updated Vital Signs BP (!) 222/76   Pulse 67   Temp 97.9 F (36.6 C) (Oral)   Resp 18   SpO2 100%  Physical Exam Vitals and nursing note reviewed.  Constitutional:      General: She is not in acute distress.    Appearance: Normal appearance. She is not ill-appearing.  HENT:     Head: Normocephalic and atraumatic.     Nose: Nose normal.  Eyes:     Conjunctiva/sclera: Conjunctivae normal.  Cardiovascular:     Rate and Rhythm: Normal rate and regular rhythm.  Pulmonary:     Effort: Pulmonary effort is normal. No respiratory distress.     Breath sounds: Normal breath sounds. No wheezing or rales.  Abdominal:     General: There is no distension.     Tenderness: There is no abdominal tenderness.  Musculoskeletal:        General: No deformity. Normal range of motion.     Right lower leg: No edema.     Left lower leg: No edema.  Skin:    Findings: No rash.  Neurological:     Mental Status: She is alert.     ED Results / Procedures / Treatments   Labs (all labs ordered are listed, but only abnormal results are displayed) Labs Reviewed  BASIC METABOLIC PANEL - Abnormal; Notable for the following components:      Result Value   Glucose, Bld 152 (*)    BUN 62 (*)    Creatinine, Ser 5.10 (*)    GFR, Estimated 9 (*)    All other components within normal limits  CBC - Abnormal; Notable for the following components:   RBC 3.43 (*)    Hemoglobin 10.2 (*)    HCT 31.7 (*)    All other components within normal limits  BRAIN NATRIURETIC PEPTIDE -  Abnormal; Notable for the following components:   B Natriuretic Peptide 1,829.6 (*)    All other components within normal limits  TROPONIN I (HIGH SENSITIVITY) - Abnormal; Notable for the following components:   Troponin I (High Sensitivity) 33 (*)    All other components within normal limits  TROPONIN I (HIGH SENSITIVITY)    EKG EKG Interpretation Date/Time:  Tuesday December 03 2022 11:58:40 EDT Ventricular Rate:  75 PR Interval:  124 QRS Duration:  82 QT Interval:  442 QTC Calculation: 493 R Axis:   84  Text Interpretation: Normal sinus rhythm Minimal voltage criteria for LVH, may be normal variant ( Sokolow-Lyon ) ST-t wave abnormality Confirmed by Gerhard Munch [phone]) on 12/03/2022 2:33:56 PM  Radiology No results found.  Procedures .Critical Care  Performed by: Marita Kansas, PA-C Authorized by: Marita Kansas, PA-C   Critical care provider statement:    Critical care time (minutes):  30   Critical care was necessary to treat or prevent imminent or life-threatening deterioration of the following conditions: Hypertensive emergency on nitro drip requiring admission.   Critical care was time spent personally by me on the following activities:  Development of treatment plan with patient or surrogate, discussions with consultants, evaluation of patient's response to treatment, examination of patient, ordering and review of laboratory studies, ordering and review of radiographic studies, ordering and performing treatments and interventions, pulse oximetry, re-evaluation of patient's condition and review of old charts   Care discussed with: admitting provider       Medications Ordered in ED Medications  nitroGLYCERIN 50 mg in dextrose 5 % 250 mL (0.2 mg/mL) infusion (has no administration in time range)  furosemide (LASIX) injection 60 mg (has no administration in time range)    ED Course/ Medical Decision Making/ A&P                                 Medical Decision  Making Amount and/or Complexity of Data Reviewed Labs: ordered.  Risk Prescription drug management.   Medical Decision Making / ED Course   This patient presents to the ED for concern of shortness of breath, this involves an extensive number of treatment options, and is a complaint that carries with it a high risk of complications and morbidity.  The differential diagnosis includes pneumonia, viral URI, CHF exacerbation, PE, ACS, hypertensive emergency, hypertensive urgency  MDM: 62 year old female noncompliant with medication presents today for elevated blood pressure.  Endorses exertional dyspnea.  Hemodynamically stable with the exception of significantly elevated BP.  Without respiratory distress.  CBC without leukocytosis.  Mild anemia noted.  BMP with creatinine of 5.10 which  is around her baseline.  She does have dialysis access but has not started dialysis yet.  Still makes urine.  Does not take a diuretic.  Mild elevation in troponin.  Will repeat.  No chest pain.  BNP of 1800.  Will start patient on nitro drip.  Will require admission for hypertensive emergency.  Discussed with hospitalist will evaluate patient for admission.    Lab Tests: -I ordered, reviewed, and interpreted labs.   The pertinent results include:   Labs Reviewed  BASIC METABOLIC PANEL - Abnormal; Notable for the following components:      Result Value   Glucose, Bld 152 (*)    BUN 62 (*)    Creatinine, Ser 5.10 (*)    GFR, Estimated 9 (*)    All other components within normal limits  CBC - Abnormal; Notable for the following components:   RBC 3.43 (*)    Hemoglobin 10.2 (*)    HCT 31.7 (*)    All other components within normal limits  BRAIN NATRIURETIC PEPTIDE - Abnormal; Notable for the following components:   B Natriuretic Peptide 1,829.6 (*)    All other components within normal limits  TROPONIN I (HIGH SENSITIVITY) - Abnormal; Notable for the following components:   Troponin I (High  Sensitivity) 33 (*)    All other components within normal limits  TROPONIN I (HIGH SENSITIVITY)      EKG  EKG Interpretation Date/Time:  Tuesday December 03 2022 11:58:40 EDT Ventricular Rate:  75 PR Interval:  124 QRS Duration:  82 QT Interval:  442 QTC Calculation: 493 R Axis:   84  Text Interpretation: Normal sinus rhythm Minimal voltage criteria for LVH, may be normal variant ( Sokolow-Lyon ) ST-t wave abnormality Confirmed by Gerhard Munch [phone]) on 12/03/2022 2:33:56 PM         Imaging Studies ordered: I ordered imaging studies including chest x-ray.  Pending formal read by radiology at the time of admission.  However on my eval there is some vascular congestion in the right sided small pleural effusion I independently visualized and interpreted imaging. I agree with the radiologist interpretation   Medicines ordered and prescription drug management: Meds ordered this encounter  Medications   nitroGLYCERIN 50 mg in dextrose 5 % 250 mL (0.2 mg/mL) infusion   furosemide (LASIX) injection 60 mg    -I have reviewed the patients home medicines and have made adjustments as needed  Critical interventions nitroglycerin drip for hypertensive emergency   Cardiac Monitoring: The patient was maintained on a cardiac monitor.  I personally viewed and interpreted the cardiac monitored which showed an underlying rhythm of: Sinus rhythm  Reevaluation: After the interventions noted above, I reevaluated the patient and found that they have :stayed the same  Co morbidities that complicate the patient evaluation  Past Medical History:  Diagnosis Date   AKI (acute kidney injury) (HCC) 09/12/2018   Stage 4   Anxiety    Bipolar 1 disorder (HCC)    Depression    Diabetes mellitus without complication (HCC)    Diarrhea 11/21/2020   Gallstones    Hyperlipidemia    Hypertension    MDD (major depressive disorder), recurrent episode, moderate (HCC) 11/18/2020   Neuropathy     Schizophrenia (HCC)    Seizures (HCC)    X1- febrile seizure as a child-none since   Suicide ideation 09/12/2018      Dispostion: Discussed with hospitalist will evaluate patient for admission   Final Clinical Impression(s) / ED Diagnoses  Final diagnoses:  Hypertensive emergency    Rx / DC Orders ED Discharge Orders     None         Marita Kansas, PA-C 12/03/22 1525    Gerhard Munch, MD 12/04/22 [phone]

## 2022-12-03 NOTE — ED Provider Triage Note (Signed)
Emergency Medicine Provider Triage Evaluation Note  EMMYLOU CRECELIUS , a 62 y.o. female  was evaluated in triage.  Pt complains of elevated BP.  Review of Systems  Positive: Shortness of breath, ?orthopnea Negative: Leg swelling, chest pain  Physical Exam  BP (!) 235/83 (BP Location: Right Arm)   Pulse 78   Temp 98.6 F (37 C)   Resp 16   SpO2 100%  Gen:   Awake, no distress   Resp:  Normal effort  MSK:   Moves extremities without difficulty  Other:  Lungs clear  Medical Decision Making  Medically screening exam initiated at 12:40 PM.  Appropriate orders placed.  BONNIEJEAN SUMMERTON was informed that the remainder of the evaluation will be completed by another provider, this initial triage assessment does not replace that evaluation, and the importance of remaining in the ED until their evaluation is complete.  62 y/o reporting fatigue and SOB, also with elevated BP. Will check BNP, CXR, ECG, basic labs   Lonell Grandchild, MD 12/03/22 1243

## 2022-12-03 NOTE — ED Notes (Addendum)
ED TO INPATIENT HANDOFF REPORT  ED Nurse Name and Phone #: Pearletha Forge RN [phone]  S Name/Age/Gender Lorraine Turner 62 y.o. female Room/Bed: 037C/037C  Code Status   Code Status: Full Code  Home/SNF/Other Home Patient oriented to: self, place, time, and situation Is this baseline? Yes   Triage Complete: Triage complete  Chief Complaint CHF exacerbation (HCC) [I50.9] Diastolic congestive heart failure (HCC) [I50.30]  Triage Note Patient arrived by [facility] with complaint of sob with exertion and not taking daily meds for months. Patient also complains of BP being high and eating large amounts of salty foods for several days. Patient has had some episodes of depression but feeling better and here to get back on meds   Allergies Allergies  Allergen Reactions   Lisinopril Other (See Comments)    Patient does not wish to take this medication (she heard that it can make your kidneys worse)   Amoxicillin Rash    Level of Care/Admitting Diagnosis ED Disposition     ED Disposition  Admit   Condition  --   Comment  Hospital Area: [facility] [[id number]]  Level of Care: Progressive [102]  Admit to Progressive based on following criteria: CARDIOVASCULAR & THORACIC of moderate stability with acute coronary syndrome symptoms/low risk myocardial infarction/hypertensive urgency/arrhythmias/heart failure potentially compromising stability and stable post cardiovascular intervention patients.  May admit patient to [facility] or [facility] if equivalent level of care is available:: No  Covid Evaluation: Asymptomatic - no recent exposure (last 10 days) testing not required  Diagnosis: Diastolic congestive heart failure [facility]) [[id number]]  Admitting Physician: Clydie Braun [[id number]]  Attending Physician: Clydie Braun [[id number]]  Certification:: I certify this patient will need inpatient services for at least 2 midnights          B Medical/Surgery History Past  Medical History:  Diagnosis Date   AKI (acute kidney injury) (HCC) 09/12/2018   Stage 4   Anxiety    Bipolar 1 disorder (HCC)    Depression    Diabetes mellitus without complication (HCC)    Diarrhea 11/21/2020   Gallstones    Hyperlipidemia    Hypertension    MDD (major depressive disorder), recurrent episode, moderate (HCC) 11/18/2020   Neuropathy    Schizophrenia (HCC)    Seizures (HCC)    X1- febrile seizure as a child-none since   Suicide ideation 09/12/2018   Past Surgical History:  Procedure Laterality Date   AV FISTULA PLACEMENT Left 06/03/2022   Procedure: INSERTION OF ARTERIOVENOUS (AV) GORE-TEX GRAFT LEFT ARM;  Surgeon: Leonie Douglas, MD;  Location: [facility] OR;  Service: Vascular;  Laterality: Left;   CESAREAN SECTION  05/1998   X 1   CHOLECYSTECTOMY N/A 03/27/2016   Procedure: LAPAROSCOPIC CHOLECYSTECTOMY;  Surgeon: Henrene Dodge, MD;  Location: [facility] ORS;  Service: General;  Laterality: N/A;   TONSILLECTOMY AND ADENOIDECTOMY     age 38   TUBAL LIGATION       A IV Location/Drains/Wounds Patient Lines/Drains/Airways Status     Active Line/Drains/Airways     Name Placement date Placement time Site Days   Peripheral IV 12/03/22 20 G Right Antecubital 12/03/22  1455  Antecubital  less than 1   Fistula / Graft Left Upper arm Arteriovenous vein graft 06/03/22  1041  Upper arm  183            Intake/Output Last 24 hours  Intake/Output Summary (Last 24 hours) at 12/03/2022 2130 Last data filed at 12/03/2022 4696 Gross  per 24 hour  Intake 59.77 ml  Output --  Net 59.77 ml    Labs/Imaging Results for orders placed or performed during the hospital encounter of 12/03/22 (from the past 48 hour(s))  Basic metabolic panel     Status: Abnormal   Collection Time: 12/03/22 12:23 PM  Result Value Ref Range   Sodium 140 135 - 145 mmol/L   Potassium 4.1 3.5 - 5.1 mmol/L   Chloride 106 98 - 111 mmol/L   CO2 23 22 - 32 mmol/L   Glucose, Bld 152 (H) 70 - 99 mg/dL     Comment: Glucose reference range applies only to samples taken after fasting for at least 8 hours.   BUN 62 (H) 8 - 23 mg/dL   Creatinine, Ser 7.42 (H) 0.44 - 1.00 mg/dL   Calcium 9.0 8.9 - 59.5 mg/dL   GFR, Estimated 9 (L) >60 mL/min    Comment: (NOTE) Calculated using the CKD-EPI Creatinine Equation (2021)    Anion gap 11 5 - 15    Comment: Performed at [facility], [address] [place], [place] [address]  CBC     Status: Abnormal   Collection Time: 12/03/22 12:23 PM  Result Value Ref Range   WBC 5.3 4.0 - 10.5 K/uL   RBC 3.43 (L) 3.87 - 5.11 MIL/uL   Hemoglobin 10.2 (L) 12.0 - 15.0 g/dL   HCT 64.3 (L) 32.9 - 51.8 %   MCV 92.4 80.0 - 100.0 fL   MCH 29.7 26.0 - 34.0 pg   MCHC 32.2 30.0 - 36.0 g/dL   RDW 84.1 66.0 - 63.0 %   Platelets 190 150 - 400 K/uL   nRBC 0.0 0.0 - 0.2 %    Comment: Performed at [facility], [address] [place], [place] [address]  Troponin I (High Sensitivity)     Status: Abnormal   Collection Time: 12/03/22 12:23 PM  Result Value Ref Range   Troponin I (High Sensitivity) 33 (H) <18 ng/L    Comment: (NOTE) Elevated high sensitivity troponin I (hsTnI) values and significant  changes across serial measurements may suggest ACS but many other  chronic and acute conditions are known to elevate hsTnI results.  Refer to the "Links" section for chest pain algorithms and additional  guidance. Performed at [facility], [address] [place], [place] [address]   Brain natriuretic peptide     Status: Abnormal   Collection Time: 12/03/22 12:23 PM  Result Value Ref Range   B Natriuretic Peptide 1,829.6 (H) 0.0 - 100.0 pg/mL    Comment: Performed at [facility], [address] [place], [place] [address]  Troponin I (High Sensitivity)     Status: Abnormal   Collection Time: 12/03/22  2:45 PM  Result Value Ref Range   Troponin I (High Sensitivity) 33 (H) <18 ng/L    Comment: (NOTE) Elevated high sensitivity troponin I (hsTnI)  values and significant  changes across serial measurements may suggest ACS but many other  chronic and acute conditions are known to elevate hsTnI results.  Refer to the "Links" section for chest pain algorithms and additional  guidance. Performed at [facility], [address] [place], [place] [address]   HIV Antibody (routine testing w rflx)     Status: None   Collection Time: 12/03/22  4:30 PM  Result Value Ref Range   HIV Screen 4th Generation wRfx Non Reactive Non Reactive    Comment: Performed  at [facility], [address] [place], [place] [address]   DG Chest 2 View  Result Date: 12/03/2022 CLINICAL DATA:  Shortness of breath and cough. EXAM: CHEST - 2 VIEW COMPARISON:  Radiograph 02/05/2022 FINDINGS: The heart is enlarged. Mediastinal contours are unchanged. There are small bilateral pleural effusions, likely chronic and decreased in size from prior exam, right greater than left. Trace fluid in the fissures. Increased interstitial thickening with septal thickening typical of pulmonary edema. No pneumothorax. IMPRESSION: Cardiomegaly with pulmonary edema. Small bilateral pleural effusions are likely chronic and improved from December. Electronically Signed   By: Narda Rutherford M.D.   On: 12/03/2022 15:34    Pending Labs Unresulted Labs (From admission, onward)     Start     Ordered   12/04/22 0500  CBC  Tomorrow morning,   R        12/03/22 1630   12/04/22 0500  Renal function panel  Daily,   R      12/03/22 1630   12/03/22 1856  Hemoglobin A1c  Add-on,   AD        12/03/22 1855   12/03/22 1822  TSH  Add-on,   AD        12/03/22 1821            Vitals/Pain Today's Vitals   12/03/22 2004 12/03/22 2015 12/03/22 2045 12/03/22 2115  BP:  (!) 151/63 (!) 144/62 130/86  Pulse:  75 74 77  Resp:  18 12 17   Temp:      TempSrc:      SpO2:  100% 98% 99%  Weight:      Height:      PainSc: 0-No pain       Isolation Precautions No active  isolations  Medications Medications  nitroGLYCERIN 50 mg in dextrose 5 % 250 mL (0.2 mg/mL) infusion (25 mcg/min Intravenous Rate/Dose Change 12/03/22 1852)  heparin injection 5,000 Units (5,000 Units Subcutaneous Given 12/03/22 1745)  sodium chloride flush (NS) 0.9 % injection 3 mL (has no administration in time range)  acetaminophen (TYLENOL) tablet 650 mg (has no administration in time range)    Or  acetaminophen (TYLENOL) suppository 650 mg (has no administration in time range)  ondansetron (ZOFRAN) tablet 4 mg ( Oral See Alternative 12/03/22 1742)    Or  ondansetron (ZOFRAN) injection 4 mg (4 mg Intravenous Given 12/03/22 1742)  albuterol (PROVENTIL) (2.5 MG/3ML) 0.083% nebulizer solution 2.5 mg (has no administration in time range)  hydrALAZINE (APRESOLINE) injection 10 mg (10 mg Intravenous Given 12/03/22 1746)  furosemide (LASIX) injection 40 mg (has no administration in time range)  hydrALAZINE (APRESOLINE) tablet 25 mg (25 mg Oral Given 12/03/22 1819)  amLODipine (NORVASC) tablet 10 mg (10 mg Oral Given 12/03/22 1819)  furosemide (LASIX) injection 60 mg (60 mg Intravenous Given 12/03/22 1459)    Mobility walks     Focused Assessments Cardiac Assessment Handoff:  Cardiac Rhythm: Normal sinus rhythm Lab Results  Component Value Date   CKTOTAL 239 (H) 11/24/2020   CKTOTAL 236 (H) 11/24/2020   CKMB 2.8 11/24/2020   TROPONINI <0.30 08/14/2013   No results found for: "DDIMER" Does the Patient currently have chest pain? No    R Recommendations: See Admitting Provider Note  Report given to:   Additional Notes:   Patient requesting Ensure (any flavor).

## 2022-12-03 NOTE — ED Notes (Signed)
Shift report received, assumed care of patient at this time 

## 2022-12-03 NOTE — ED Notes (Signed)
ED TO INPATIENT HANDOFF REPORT  ED Nurse Name and Phone #: [name] [phone]  S Name/Age/Gender Lorraine Turner 62 y.o. female Room/Bed: 037C/037C  Code Status   Code Status: Full Code  Home/SNF/Other Home Patient oriented to: self, place, time, and situation Is this baseline? Yes   Triage Complete: Triage complete  Chief Complaint CHF exacerbation (HCC) [I50.9] Diastolic congestive heart failure (HCC) [I50.30]  Triage Note Patient arrived by [facility] with complaint of sob with exertion and not taking daily meds for months. Patient also complains of BP being high and eating large amounts of salty foods for several days. Patient has had some episodes of depression but feeling better and here to get back on meds   Allergies Allergies  Allergen Reactions   Lisinopril Other (See Comments)    Patient does not wish to take this medication (she heard that it can make your kidneys worse)   Amoxicillin Rash    Level of Care/Admitting Diagnosis ED Disposition     ED Disposition  Admit   Condition  --   Comment  Hospital Area[facility] [[id number]]  Level of Care: Progressive [102]  Admit to Progressive based on following criteria: CARDIOVASCULAR & THORACIC of moderate stability with acute coronary syndrome symptoms/low risk myocardial infarction/hypertensive urgency/arrhythmias/heart failure potentially compromising stability and stable post cardiovascular intervention patients.  [facility] admit [facility] if equivalent level of care is available:: No  Covid Evaluation: Asymptomatic - no recent exposure (last 10 days) testing not required  Diagnosis: Diastolic congestive heart failure [facility]) [[id number]]  Admitting Physician: Clydie Braun [[id number]]  Attending Physician: Clydie Braun [[id number]]  Certification:: I certify this patient will need inpatient services for at least 2 midnights          B Medical/Surgery History Past Medical  History:  Diagnosis Date   AKI (acute kidney injury) (HCC) 09/12/2018   Stage 4   Anxiety    Bipolar 1 disorder (HCC)    Depression    Diabetes mellitus without complication (HCC)    Diarrhea 11/21/2020   Gallstones    Hyperlipidemia    Hypertension    MDD (major depressive disorder), recurrent episode, moderate (HCC) 11/18/2020   Neuropathy    Schizophrenia (HCC)    Seizures (HCC)    X1- febrile seizure as a child-none since   Suicide ideation 09/12/2018   Past Surgical History:  Procedure Laterality Date   AV FISTULA PLACEMENT Left 06/03/2022   Procedure: INSERTION OF ARTERIOVENOUS (AV) GORE-TEX GRAFT LEFT ARM;  Surgeon: Leonie Douglas, MD;  Location: [facility] OR;  Service: Vascular;  Laterality: Left;   CESAREAN SECTION  05/1998   X 1   CHOLECYSTECTOMY N/A 03/27/2016   Procedure: LAPAROSCOPIC CHOLECYSTECTOMY;  Surgeon: Henrene Dodge, MD;  Location: [facility] ORS;  Service: General;  Laterality: N/A;   TONSILLECTOMY AND ADENOIDECTOMY     age 72   TUBAL LIGATION       A IV Location/Drains/Wounds Patient Lines/Drains/Airways Status     Active Line/Drains/Airways     Name Placement date Placement time Site Days   Peripheral IV 12/03/22 20 G Right Antecubital 12/03/22  1455  Antecubital  less than 1   Fistula / Graft Left Upper arm Arteriovenous vein graft 06/03/22  1041  Upper arm  183            Intake/Output Last 24 hours  Intake/Output Summary (Last 24 hours) at 12/03/2022 1854 Last data filed at 12/03/2022 1835 Gross per  24 hour  Intake 54.81 ml  Output --  Net 54.81 ml    Labs/Imaging Results for orders placed or performed during the hospital encounter of 12/03/22 (from the past 48 hour(s))  Basic metabolic panel     Status: Abnormal   Collection Time: 12/03/22 12:23 PM  Result Value Ref Range   Sodium 140 135 - 145 mmol/L   Potassium 4.1 3.5 - 5.1 mmol/L   Chloride 106 98 - 111 mmol/L   CO2 23 22 - 32 mmol/L   Glucose, Bld 152 (H) 70 - 99 mg/dL    Comment:  Glucose reference range applies only to samples taken after fasting for at least 8 hours.   BUN 62 (H) 8 - 23 mg/dL   Creatinine, Ser 1.61 (H) 0.44 - 1.00 mg/dL   Calcium 9.0 8.9 - 09.6 mg/dL   GFR, Estimated 9 (L) >60 mL/min    Comment: (NOTE) Calculated using the CKD-EPI Creatinine Equation (2021)    Anion gap 11 5 - 15    Comment: Performed at [facility], [address], [place], [place] [address]  CBC     Status: Abnormal   Collection Time: 12/03/22 12:23 PM  Result Value Ref Range   WBC 5.3 4.0 - 10.5 K/uL   RBC 3.43 (L) 3.87 - 5.11 MIL/uL   Hemoglobin 10.2 (L) 12.0 - 15.0 g/dL   HCT 98.1 (L) 19.1 - 47.8 %   MCV 92.4 80.0 - 100.0 fL   MCH 29.7 26.0 - 34.0 pg   MCHC 32.2 30.0 - 36.0 g/dL   RDW 29.5 62.1 - 30.8 %   Platelets 190 150 - 400 K/uL   nRBC 0.0 0.0 - 0.2 %    Comment: Performed at [facility], [address], [place], [place] [address]  Troponin I (High Sensitivity)     Status: Abnormal   Collection Time: 12/03/22 12:23 PM  Result Value Ref Range   Troponin I (High Sensitivity) 33 (H) <18 ng/L    Comment: (NOTE) Elevated high sensitivity troponin I (hsTnI) values and significant  changes across serial measurements may suggest ACS but many other  chronic and acute conditions are known to elevate hsTnI results.  Refer to the "Links" section for chest pain algorithms and additional  guidance. Performed at [facility], [address], [place], [place] [address]   Brain natriuretic peptide     Status: Abnormal   Collection Time: 12/03/22 12:23 PM  Result Value Ref Range   B Natriuretic Peptide 1,829.6 (H) 0.0 - 100.0 pg/mL    Comment: Performed at [facility], [address] [place], [place] [address]  Troponin I (High Sensitivity)     Status: Abnormal   Collection Time: 12/03/22  2:45 PM  Result Value Ref Range   Troponin I (High Sensitivity) 33 (H) <18 ng/L    Comment: (NOTE) Elevated high sensitivity troponin I (hsTnI) values and  significant  changes across serial measurements may suggest ACS but many other  chronic and acute conditions are known to elevate hsTnI results.  Refer to the "Links" section for chest pain algorithms and additional  guidance. Performed at [facility], [address], [place], [place] [address]   HIV Antibody (routine testing w rflx)     Status: None   Collection Time: 12/03/22  4:30 PM  Result Value Ref Range   HIV Screen 4th Generation wRfx Non Reactive Non Reactive    Comment: Performed at  [facility], [address] [place], [place] [address]   DG Chest 2 View  Result Date: 12/03/2022 CLINICAL DATA:  Shortness of breath and cough. EXAM: CHEST - 2 VIEW COMPARISON:  Radiograph 02/05/2022 FINDINGS: The heart is enlarged. Mediastinal contours are unchanged. There are small bilateral pleural effusions, likely chronic and decreased in size from prior exam, right greater than left. Trace fluid in the fissures. Increased interstitial thickening with septal thickening typical of pulmonary edema. No pneumothorax. IMPRESSION: Cardiomegaly with pulmonary edema. Small bilateral pleural effusions are likely chronic and improved from December. Electronically Signed   By: Narda Rutherford M.D.   On: 12/03/2022 15:34    Pending Labs Unresulted Labs (From admission, onward)     Start     Ordered   12/04/22 0500  CBC  Tomorrow morning,   R        12/03/22 1630   12/04/22 0500  Renal function panel  Daily,   R      12/03/22 1630   12/03/22 1822  TSH  Add-on,   AD        12/03/22 1821            Vitals/Pain Today's Vitals   12/03/22 1815 12/03/22 1820 12/03/22 1830 12/03/22 1845  BP: (!) 181/72  (!) 178/76 (!) 170/68  Pulse: 72  77 76  Resp: 20  18 18   Temp:  98 F (36.7 C)    TempSrc:  Oral    SpO2: 100%  100% 100%  Weight:      Height:      PainSc:        Isolation Precautions No active isolations  Medications Medications  nitroGLYCERIN 50 mg in dextrose 5 %  250 mL (0.2 mg/mL) infusion (50 mcg/min Intravenous Rate/Dose Change 12/03/22 1834)  heparin injection 5,000 Units (5,000 Units Subcutaneous Given 12/03/22 1745)  sodium chloride flush (NS) 0.9 % injection 3 mL (has no administration in time range)  acetaminophen (TYLENOL) tablet 650 mg (has no administration in time range)    Or  acetaminophen (TYLENOL) suppository 650 mg (has no administration in time range)  ondansetron (ZOFRAN) tablet 4 mg ( Oral See Alternative 12/03/22 1742)    Or  ondansetron (ZOFRAN) injection 4 mg (4 mg Intravenous Given 12/03/22 1742)  albuterol (PROVENTIL) (2.5 MG/3ML) 0.083% nebulizer solution 2.5 mg (has no administration in time range)  hydrALAZINE (APRESOLINE) injection 10 mg (10 mg Intravenous Given 12/03/22 1746)  furosemide (LASIX) injection 40 mg (has no administration in time range)  hydrALAZINE (APRESOLINE) tablet 25 mg (25 mg Oral Given 12/03/22 1819)  amLODipine (NORVASC) tablet 10 mg (10 mg Oral Given 12/03/22 1819)  furosemide (LASIX) injection 60 mg (60 mg Intravenous Given 12/03/22 1459)    Mobility walks     Focused Assessments Cardiac Assessment Handoff:  Cardiac Rhythm: Normal sinus rhythm Lab Results  Component Value Date   CKTOTAL 239 (H) 11/24/2020   CKTOTAL 236 (H) 11/24/2020   CKMB 2.8 11/24/2020   TROPONINI <0.30 08/14/2013   No results found for: "DDIMER" Does the Patient currently have chest pain? No    R Recommendations: See Admitting Provider Note  Report given to:   Additional Notes:

## 2022-12-04 ENCOUNTER — Other Ambulatory Visit (HOSPITAL_COMMUNITY): Payer: MEDICAID

## 2022-12-04 DIAGNOSIS — I5033 Acute on chronic diastolic (congestive) heart failure: Secondary | ICD-10-CM | POA: Diagnosis not present

## 2022-12-04 LAB — RENAL FUNCTION PANEL
Albumin: 3.2 g/dL — ABNORMAL LOW (ref 3.5–5.0)
Anion gap: 9 (ref 5–15)
BUN: 66 mg/dL — ABNORMAL HIGH (ref 8–23)
CO2: 24 mmol/L (ref 22–32)
Calcium: 8.5 mg/dL — ABNORMAL LOW (ref 8.9–10.3)
Chloride: 106 mmol/L (ref 98–111)
Creatinine, Ser: 5.68 mg/dL — ABNORMAL HIGH (ref 0.44–1.00)
GFR, Estimated: 8 mL/min — ABNORMAL LOW (ref >60)
Glucose, Bld: 167 mg/dL — ABNORMAL HIGH (ref 70–99)
Phosphorus: 3.8 mg/dL (ref 2.5–4.6)
Potassium: 3.9 mmol/L (ref 3.5–5.1)
Sodium: 139 mmol/L (ref 135–145)

## 2022-12-04 LAB — IRON AND TIBC
Iron: 36 ug/dL (ref 28–170)
Saturation Ratios: 12 % (ref 10.4–31.8)
TIBC: 300 ug/dL (ref 250–450)
UIBC: 264 ug/dL

## 2022-12-04 LAB — HEMOGLOBIN A1C
Hgb A1c MFr Bld: 6.7 % — ABNORMAL HIGH (ref 4.8–5.6)
Mean Plasma Glucose: 145.59 mg/dL

## 2022-12-04 LAB — CBC
HCT: 28.4 % — ABNORMAL LOW (ref 36.0–46.0)
Hemoglobin: 9.3 g/dL — ABNORMAL LOW (ref 12.0–15.0)
MCH: 29.5 pg (ref 26.0–34.0)
MCHC: 32.7 g/dL (ref 30.0–36.0)
MCV: 90.2 fL (ref 80.0–100.0)
Platelets: 185 10*3/uL (ref 150–400)
RBC: 3.15 MIL/uL — ABNORMAL LOW (ref 3.87–5.11)
RDW: 13.6 % (ref 11.5–15.5)
WBC: 5.9 10*3/uL (ref 4.0–10.5)
nRBC: 0 % (ref 0.0–0.2)

## 2022-12-04 LAB — FERRITIN: Ferritin: 162 ng/mL (ref 11–307)

## 2022-12-04 LAB — VITAMIN B12: Vitamin B-12: 316 pg/mL (ref 180–914)

## 2022-12-04 LAB — RETICULOCYTES
Immature Retic Fract: 12 % (ref 2.3–15.9)
RBC.: 3.31 MIL/uL — ABNORMAL LOW (ref 3.87–5.11)
Retic Count, Absolute: 60.2 10*3/uL (ref 19.0–186.0)
Retic Ct Pct: 1.8 % (ref 0.4–3.1)

## 2022-12-04 LAB — FOLATE: Folate: 12 ng/mL (ref >5.9)

## 2022-12-04 LAB — PHOSPHORUS: Phosphorus: 4.3 mg/dL (ref 2.5–4.6)

## 2022-12-04 LAB — TSH: TSH: 2.98 u[IU]/mL (ref 0.350–4.500)

## 2022-12-04 MED ORDER — ADULT MULTIVITAMIN W/MINERALS CH
1.0000 | ORAL_TABLET | Freq: Every day | ORAL | Status: DC
  Administered 2022-12-04 – 2022-12-06 (×3): 1 via ORAL
  Filled 2022-12-04 (×3): qty 1

## 2022-12-04 NOTE — TOC Initial Note (Addendum)
Transition of Care [facility]) - Initial/Assessment Note    Patient Details  Name: Lorraine Turner MRN: [id number] Date of Birth: April 07, 1960  Transition of Care [facility]) CM/SW Contact:    Leone Haven, RN Phone Number: 12/04/2022, 4:10 PM  Clinical Narrative:                 From home with two daughters, has PCP and insurance on file, states has no HH services in place at this time or DME at home.  States son will transport her home at [employer] and family is support system, states gets medications from [facility] on [address] and [address].  Pta self ambulatory . She states she needs a script for a bp cuff. Will notify MD. MD states she can get this over the counter.  Expected Discharge Plan: Home/Self Care Barriers to Discharge: Continued Medical Work up   Patient Goals and CMS Choice Patient states their goals for this hospitalization and ongoing recovery are:: return home   Choice offered to / list presented to : NA      Expected Discharge Plan and Services In-house Referral: NA Discharge Planning Services: CM Consult Post Acute Care Choice: NA Living arrangements for the past 2 months: Single Family Home                 DME Arranged: N/A DME Agency: NA       HH Arranged: NA          Prior Living Arrangements/Services Living arrangements for the past 2 months: Single Family Home Lives with:: Adult Children Patient language and need for interpreter reviewed:: Yes Do you feel safe going back to the place where you live?: Yes      Need for Family Participation in Patient Care: Yes (Comment) Care giver support system in place?: Yes (comment)   Criminal Activity/Legal Involvement Pertinent to Current Situation/Hospitalization: No - Comment as needed  Activities of Daily Living   ADL Screening (condition at time of admission) Independently performs ADLs?: Yes (appropriate for developmental age) Is the patient deaf or have difficulty hearing?: No Does the patient have  difficulty seeing, even when wearing glasses/contacts?: Yes Does the patient have difficulty concentrating, remembering, or making decisions?: No  Permission Sought/Granted Permission sought to share information with : Case Manager Permission granted to share information with : Yes, Verbal Permission Granted              Emotional Assessment Appearance:: Appears stated age Attitude/Demeanor/Rapport: Engaged Affect (typically observed): Appropriate Orientation: : Oriented to Self, Oriented to Place, Oriented to  Time, Oriented to Situation Alcohol / Substance Use: Not Applicable Psych Involvement: No (comment)  Admission diagnosis:  CHF exacerbation (HCC) [I50.9] Diastolic congestive heart failure (HCC) [I50.30] Hypertensive emergency [I16.1] Patient Active Problem List   Diagnosis Date Noted   CHF exacerbation (HCC) 12/03/2022   Diastolic congestive heart failure (HCC) 12/03/2022   ESRD (end stage renal disease) (HCC) 06/03/2022   Stage 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 chronic dialysis (HCC) 02/12/2022   Acute CHF (congestive heart failure) (HCC) 02/08/2022   AKI (acute kidney injury) (HCC) 02/08/2022   Acute heart failure (HCC) 02/08/2022   Chronic kidney disease (CKD), stage IV (severe) (HCC)    Asymptomatic hypertensive urgency 10/15/2021   Intractable nausea and vomiting 12/15/2020   Hypokalemia 12/15/2020   UTI (urinary tract infection) 11/24/2020   Acute renal failure superimposed on stage 4 chronic kidney disease (HCC)    Hypertensive urgency 09/01/2020   CKD (chronic kidney disease),  stage III (HCC) 09/01/2020   DKA (diabetic ketoacidosis) (HCC) 09/12/2018   Suicidal ideation 09/12/2018   Adjustment disorder with mixed disturbance of emotions and conduct    MDD (major depressive disorder), severe (HCC) 03/03/2018   Umbilical hernia with obstruction 11/24/2015   Symptomatic cholelithiasis 11/24/2015   Type 2 diabetes mellitus with hyperglycemia (HCC)    Suicide  attempt (HCC) 05/06/2014   Depression 08/14/2011   UNSPECIFIED ANEMIA 01/23/2009   Essential hypertension 10/12/2008   HYPERCHOLESTEROLEMIA 01/01/2007   PANIC DISORDER 01/01/2007   Obsessive compulsive disorder 01/01/2007   PCP:  Hoy Register, MD Pharmacy:   [facility] [address] [place] [address] Phone: [phone] Fax: [phone]  [facility] [address] [place] [address] Phone: [phone] Fax: [phone]  [facility] [phone] - [place], [place] [address] AT [facility] [address] [facility] [address] [place] [address] Phone: [phone] Fax: [phone]     Social Determinants of Health (SDOH) Social History: SDOH Screenings   Food Insecurity: Food Insecurity Present (12/03/2022)  Housing: Low Risk  (12/03/2022)  Transportation Needs: No Transportation Needs (12/03/2022)  Utilities: Not At Risk (12/03/2022)  Alcohol Screen: Low Risk  (11/17/2020)  Depression (PHQ2-9): Medium Risk (04/03/2022)  Financial Resource Strain: High Risk (02/26/2022)  Tobacco Use: Medium Risk (12/03/2022)   SDOH Interventions:     Readmission Risk Interventions    12/04/2022    4:07 PM 02/12/2022    2:59 PM 11/26/2020   11:18 AM  Readmission Risk Prevention Plan  Transportation Screening Complete Complete Complete  PCP or Specialist Appt within 3-5 Days Complete  Complete  HRI or Home Care Consult Complete -- Complete  Social Work Consult for Recovery Care Planning/Counseling  Complete Complete  Palliative Care Screening Not Applicable Not Applicable Not Applicable  Medication Review Oceanographer) Complete Complete Complete

## 2022-12-04 NOTE — Plan of Care (Signed)
  Problem: Clinical Measurements: Goal: Will remain free from infection Outcome: Progressing   Problem: Clinical Measurements: Goal: Diagnostic test results will improve Outcome: Progressing   Problem: Clinical Measurements: Goal: Respiratory complications will improve Outcome: Progressing   Problem: Clinical Measurements: Goal: Cardiovascular complication will be avoided Outcome: Progressing   

## 2022-12-04 NOTE — Progress Notes (Signed)
Initial Nutrition Assessment  DOCUMENTATION CODES:   Non-severe (moderate) malnutrition in context of social or environmental circumstances  INTERVENTION:  Encourage adequate PO intake Ensure Enlive po BID, each supplement provides 350 kcal and 20 grams of protein. MVI with minerals daily "Chronic Kidney Disease Stage 3-5 Nutrition Therapy" handout and "Heart Healthy Consistent Carbohydrate Nutrition Therapy" handouts added to AVS Referral placed to [facility]  NUTRITION DIAGNOSIS:   Moderate Malnutrition related to social / environmental circumstances as evidenced by mild muscle depletion, moderate muscle depletion, percent weight loss.  GOAL:   Patient will meet greater than or equal to 90% of their needs  MONITOR:   PO intake, Supplement acceptance, Labs, Weight trends  REASON FOR ASSESSMENT:   Consult Diet education  ASSESSMENT:   Pt admitted with SOB and intermittent confusion, found to have diastolic CHF on admission. PMH significant for HTN, HLD, CKD stage V not on HD, depression and prior tobacco use.  Per Nephrology, no indication for HD at this time however pt with LUE AVG.   Spoke with pt at bedside. She is very conversant. She is concerned with her nutritional status and being able to avoid dialysis. Addressed all current nutrition questions. She is familiar with nutrition for kidney disease and diabetes as these are conditions her mom encountered. Pt asking for ongoing outpatient diet education. Referral placed.   Pt states that she has been battling depression. When she is going through a more significant depressive phase, she sleeps more and eats less. She reports that at baseline she consumes on average 1 meal per day but occasionally 2. She does not keep many groceries in her home.  Pt used to have food stamps but mentions that she no longer qualifies. Has limited amount to spend on groceries follownig bills. Her son often takes her out to eat at places like  [employer], [employer] and [employer]. She was previously consuming Ensure supplements to augment her intake however stopped purchasing these d/t cost.   Pt noted to have poor dentition. Mentions that she has difficulty chewing hard/crunchy food items d/t tooth pain. She has plans to visit a dentist for implants. Denies current need for altered nutrition textures as she is able to select foods she can tolerate.   Meal completions: 10/23: 90% breakfast, 50% lunch  11.2% weight loss noted within the last 6 months which is clinically significant for time frame.   Medications: lasix 40mg  BID  Labs: BUN 66, Cr 5.68, GFR 8, HgbA1c 6.7%  NUTRITION - FOCUSED PHYSICAL EXAM:  Flowsheet Row Most Recent Value  Orbital Region No depletion  Upper Arm Region Mild depletion  Thoracic and Lumbar Region No depletion  Buccal Region No depletion  Temple Region No depletion  Clavicle Bone Region Mild depletion  Clavicle and Acromion Bone Region No depletion  Scapular Bone Region No depletion  Dorsal Hand Mild depletion  Patellar Region Moderate depletion  Anterior Thigh Region Moderate depletion  Posterior Calf Region Mild depletion  Edema (RD Assessment) None  Hair Reviewed  Eyes Reviewed  Mouth Other (Comment)  [poor dentition]  Skin Reviewed  Nails Reviewed       Diet Order:   Diet Order             Diet Heart Room service appropriate? Yes; Fluid consistency: Thin  Diet effective now                   EDUCATION NEEDS:   Education needs have been addressed  Skin:  Skin  Assessment: Reviewed RN Assessment  Last BM:  10/21  Height:   Ht Readings from Last 1 Encounters:  12/03/22 5\' 2"  (1.575 m)    Weight:   Wt Readings from Last 1 Encounters:  12/04/22 56.4 kg   BMI:  Body mass index is 22.75 kg/m.  Estimated Nutritional Needs:   Kcal:  1500-1700  Protein:  75-90g  Fluid:  >/=1.5L  Drusilla Kanner, RDN, LDN Clinical Nutrition
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
## 2022-12-04 NOTE — Consult Note (Signed)
Reason for Consult: Renal failure Referring Physician:  Dr. 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  - She's feeling much better and understands she needs to take her medications. She actually asked what she can do to delay having to start dialysis. I educated her and most important thing she can do is to take her medications. - No indication to initiate dialysis but when needed she has a functional LUA AVG. - Would place her on Lasix 80/40 upon d/c and we can schedule a 2 week f/u for her. She states her next appt is not till the new year but she was supposed to be seen q6weeks.  -Monitor Daily I/Os, Daily weight  -Maintain MAP>65 for optimal renal perfusion.  - Avoid nephrotoxic agents such as IV contrast, NSAIDs, and phosphate containing bowel preps (FLEETS)  Hypertensive emergency - better controlled; goal is 30% drop in the 1st 24 hrs and she is already there. She just needs to take her medications. Renal osteodystrophy - will check a iPTH and phos. DM - diet controlled Anemia - will check an iron panel. HFpEF Schizoaffective Social - currently living with her daughter.   HPI: Lorraine Turner is an 62 y.o. female schizoaffective disorder, HTN, HLD, depression, anxiety with h/o homelessness + CKD 5. Initially seen in 01/2022 in the hospital with GFT in the 9's with CKD thought to be progression in the setting of poorly controlled HTN. Patient last seen at [facility] on 08/20/2022 with SBP noted to be 210 with noncompliance running  out of her medications. Creatinine was 5.4 at time of clinic visit. Kidney u/s 02/09/22 showed decreased volumes but no hydronephrosis. Weight at [facility] clinic visit on 08/20/22 was 126.6 lbs.   Patient presenting with SOB and has been off all her medications for several months secondary to being depressed.  Decreased exercise tolerance as well with nonproductive cough, orthopnea. She denies fever, chills, nausea, chest pain, weakness, decreased UOP or dysuria.   BP in the ED was 235/83 HB 10.2 K 4.1 BUN/Cr 62/5.1 BNP 1830. Started on a nitroglycerin drip and given Lasix 40mg .  Allergic to lisinopril (she heard it can make the kidneys worse), amoxicillin rash.  ROS Pertinent items are noted in HPI.  Chemistry and CBC: Creat  Date/Time Value Ref Range Status  04/09/2016 11:50 AM 0.65 0.50 - 1.05 mg/dL Final    Comment:      For patients > or = 62 years of age: The upper reference limit for Creatinine is approximately 13% higher for people identified as African-American.      Creatinine, Ser  Date/Time Value Ref Range Status  12/04/2022 03:09 AM 5.68 (H) 0.44 - 1.00 mg/dL Final  16/11/9602 54:09 PM 5.10 (H) 0.44 - 1.00 mg/dL Final  81/19/1478 29:56 AM 5.42 (H) 0.44 - 1.00 mg/dL Final  21/30/8657 84:69 PM 5.57 (H) 0.44 - 1.00 mg/dL Final  62/95/2841 32:44 AM 5.90 (H) 0.44 - 1.00 mg/dL Final  02/13/7251 66:44 AM 5.56 (H) 0.44 - 1.00 mg/dL Final  03/47/4259 56:38 AM 5.49 (H) 0.44 - 1.00 mg/dL Final  75/64/3329 51:88 AM 5.42 (H) 0.44 - 1.00 mg/dL Final  41/66/0630 16:01 AM 5.63 (H) 0.44 - 1.00 mg/dL Final  09/32/3557 32:20 AM 5.86 (H) 0.44 - 1.00 mg/dL Final  25/42/7062 37:62 PM 5.78 (H) 0.44 - 1.00 mg/dL Final  83/15/1761 60:73 AM 5.53 (H)  0.44 - 1.00 mg/dL Final  16/11/9602 54:09 AM 5.44 (H) 0.44 - 1.00 mg/dL Final  81/19/1478 29:56 PM 4.94 (H) 0.44 - 1.00 mg/dL Final  21/30/8657 84:69 PM 4.79 (H) 0.44 - 1.00 mg/dL Final  62/95/2841 32:44 AM 4.80 (H) 0.44 - 1.00 mg/dL Final  02/13/7251 66:44 AM 5.17 (H) 0.44 - 1.00 mg/dL Final  03/47/4259 56:38 PM 5.27 (H) 0.44 - 1.00 mg/dL Final  75/64/3329 51:88 PM 5.13 (H) 0.44 - 1.00 mg/dL Final  41/66/0630 16:01 PM 3.65 (H) 0.44 - 1.00 mg/dL Final  09/32/3557 32:20 AM 3.48 (H) 0.44 - 1.00 mg/dL Final  25/42/7062 37:62 AM 3.65 (H) 0.44 - 1.00  mg/dL Final  83/15/1761 60:73 AM 3.32 (H) 0.44 - 1.00 mg/dL Final  71/07/2692 85:46 AM 3.49 (H) 0.44 - 1.00 mg/dL Final  27/04/5007 38:18 AM 3.83 (H) 0.44 - 1.00 mg/dL Final  29/93/7169 67:89 PM 3.85 (H) 0.44 - 1.00 mg/dL Final  38/11/1749 02:58 AM 3.75 (H) 0.44 - 1.00 mg/dL Final  52/77/8242 35:36 AM 3.53 (H) 0.44 - 1.00 mg/dL Final  14/43/1540 08:67 AM 3.36 (H) 0.44 - 1.00 mg/dL Final  61/95/0932 67:12 AM 3.65 (H) 0.44 - 1.00 mg/dL Final  45/80/9983 38:25 AM 4.33 (H) 0.44 - 1.00 mg/dL Final  05/39/7673 41:93 AM 4.22 (H) 0.44 - 1.00 mg/dL Final  79/03/4095 35:32 PM 4.67 (H) 0.44 - 1.00 mg/dL Final  99/24/2683 41:96 PM 3.28 (H) 0.44 - 1.00 mg/dL Final  22/29/7989 21:19 AM 3.64 (H) 0.44 - 1.00 mg/dL Final  41/74/0814 48:18 PM 3.04 (H) 0.44 - 1.00 mg/dL Final  56/31/4970 26:37 AM 2.81 (H) 0.44 - 1.00 mg/dL Final  85/88/5027 74:12 AM 3.00 (H) 0.44 - 1.00 mg/dL Final  87/86/7672 09:47 AM 3.43 (H) 0.44 - 1.00 mg/dL Final  09/62/8366 29:47 AM 2.94 (H) 0.44 - 1.00 mg/dL Final  65/46/5035 46:56 PM 3.60 (H) 0.44 - 1.00 mg/dL Final  81/27/5170 01:74 PM 2.60 (H) 0.44 - 1.00 mg/dL Final  94/49/6759 16:38 PM 2.03 (H) 0.57 - 1.00 mg/dL Final  46/65/9935 70:17 PM 2.10 (H) 0.57 - 1.00 mg/dL Final  79/39/0300 92:33 PM 1.79 (H) 0.57 - 1.00 mg/dL Final  00/76/2263 33:54 PM 2.35 (H) 0.44 - 1.00 mg/dL Final  56/25/6389 37:34 PM 1.18 (H) 0.44 - 1.00 mg/dL Final  28/76/8115 72:62 AM 0.90 0.44 - 1.00 mg/dL Final  03/55/9741 63:84 PM 1.09 (H) 0.44 - 1.00 mg/dL Final  53/64/6803 21:22 AM 1.20 (H) 0.44 - 1.00 mg/dL Final  48/25/0037 04:88 AM 1.34 (H) 0.44 - 1.00 mg/dL Final  89/16/9450 38:88 AM 1.58 (H) 0.44 - 1.00 mg/dL Final   Recent Labs  Lab 12/03/22 1223 12/04/22 0309  NA 140 139  K 4.1 3.9  CL 106 106  CO2 23 24  GLUCOSE 152* 167*  BUN 62* 66*  CREATININE 5.10* 5.68*  CALCIUM 9.0 8.5*  PHOS  --  3.8   Recent Labs  Lab 12/03/22 1223 12/04/22 0309  WBC 5.3 5.9  HGB 10.2* 9.3*  HCT 31.7*  28.4*  MCV 92.4 90.2  PLT 190 185   Liver Function Tests: Recent Labs  Lab 12/04/22 0309  ALBUMIN 3.2*   No results for input(s): "LIPASE", "AMYLASE" in the last 168 hours. No results for input(s): "AMMONIA" in the last 168 hours. Cardiac Enzymes: No results for input(s): "CKTOTAL", "CKMB", "CKMBINDEX", "TROPONINI" in the last 168 hours. Iron Studies: No results for input(s): "IRON", "TIBC", "TRANSFERRIN", "FERRITIN" in the last 72 hours. PT/INR: @LABRCNTIP (inr:5)  Xrays/Other Studies: ) Results for orders  placed or performed during the hospital encounter of 12/03/22 (from the past 48 hour(s))  Basic metabolic panel     Status: Abnormal   Collection Time: 12/03/22 12:23 PM  Result Value Ref Range   Sodium 140 135 - 145 mmol/L   Potassium 4.1 3.5 - 5.1 mmol/L   Chloride 106 98 - 111 mmol/L   CO2 23 22 - 32 mmol/L   Glucose, Bld 152 (H) 70 - 99 mg/dL    Comment: Glucose reference range applies only to samples taken after fasting for at least 8 hours.   BUN 62 (H) 8 - 23 mg/dL   Creatinine, Ser 0.98 (H) 0.44 - 1.00 mg/dL   Calcium 9.0 8.9 - 11.9 mg/dL   GFR, Estimated 9 (L) >60 mL/min    Comment: (NOTE) Calculated using the CKD-EPI Creatinine Equation (2021)    Anion gap 11 5 - 15    Comment: Performed at [facility], [address], [place], [place] [address]  CBC     Status: Abnormal   Collection Time: 12/03/22 12:23 PM  Result Value Ref Range   WBC 5.3 4.0 - 10.5 K/uL   RBC 3.43 (L) 3.87 - 5.11 MIL/uL   Hemoglobin 10.2 (L) 12.0 - 15.0 g/dL   HCT 95.6 (L) 21.3 - 08.6 %   MCV 92.4 80.0 - 100.0 fL   MCH 29.7 26.0 - 34.0 pg   MCHC 32.2 30.0 - 36.0 g/dL   RDW 57.8 46.9 - 62.9 %   Platelets 190 150 - 400 K/uL   nRBC 0.0 0.0 - 0.2 %    Comment: Performed at [facility], [address] [place], [place] [address]  Troponin I (High Sensitivity)     Status: Abnormal   Collection Time: 12/03/22 12:23 PM  Result Value Ref Range   Troponin I (High Sensitivity)  33 (H) <18 ng/L    Comment: (NOTE) Elevated high sensitivity troponin I (hsTnI) values and significant  changes across serial measurements may suggest ACS but many other  chronic and acute conditions are known to elevate hsTnI results.  Refer to the "Links" section for chest pain algorithms and additional  guidance. Performed at [facility], [address], [place], [place] [address]   Brain natriuretic peptide     Status: Abnormal   Collection Time: 12/03/22 12:23 PM  Result Value Ref Range   B Natriuretic Peptide 1,829.6 (H) 0.0 - 100.0 pg/mL    Comment: Performed at [facility], [address], [place], [place] [address]  Troponin I (High Sensitivity)     Status: Abnormal   Collection Time: 12/03/22  2:45 PM  Result Value Ref Range   Troponin I (High Sensitivity) 33 (H) <18 ng/L    Comment: (NOTE) Elevated high sensitivity troponin I (hsTnI) values and significant  changes across serial measurements may suggest ACS but many other  chronic and acute conditions are known to elevate hsTnI results.  Refer to the "Links" section for chest pain algorithms and additional  guidance. Performed at [facility], [address] [place], [place] [address]   HIV Antibody (routine testing w rflx)     Status: None   Collection Time: 12/03/22  4:30 PM  Result Value Ref Range   HIV Screen 4th Generation wRfx Non Reactive Non Reactive    Comment: Performed at [facility], [address] [place], [place] [address]  CBC     Status: Abnormal  Collection Time: 12/04/22  3:09 AM  Result Value Ref Range   WBC 5.9 4.0 - 10.5 K/uL   RBC 3.15 (L) 3.87 - 5.11 MIL/uL   Hemoglobin 9.3 (L) 12.0 - 15.0 g/dL   HCT 62.9 (L) 52.8 - 41.3 %   MCV 90.2 80.0 - 100.0 fL   MCH 29.5 26.0 - 34.0 pg   MCHC 32.7 30.0 - 36.0 g/dL   RDW 24.4 01.0 - 27.2 %   Platelets 185 150 - 400 K/uL   nRBC 0.0 0.0 - 0.2 %    Comment: Performed at [facility], [address], [place],  [place] [address]  Renal function panel     Status: Abnormal   Collection Time: 12/04/22  3:09 AM  Result Value Ref Range   Sodium 139 135 - 145 mmol/L   Potassium 3.9 3.5 - 5.1 mmol/L   Chloride 106 98 - 111 mmol/L   CO2 24 22 - 32 mmol/L   Glucose, Bld 167 (H) 70 - 99 mg/dL    Comment: Glucose reference range applies only to samples taken after fasting for at least 8 hours.   BUN 66 (H) 8 - 23 mg/dL   Creatinine, Ser 4.03 (H) 0.44 - 1.00 mg/dL   Calcium 8.5 (L) 8.9 - 10.3 mg/dL   Phosphorus 3.8 2.5 - 4.6 mg/dL   Albumin 3.2 (L) 3.5 - 5.0 g/dL   GFR, Estimated 8 (L) >60 mL/min    Comment: (NOTE) Calculated using the CKD-EPI Creatinine Equation (2021)    Anion gap 9 5 - 15    Comment: Performed at [facility], [address], [place], [place] [address]  Hemoglobin A1c     Status: Abnormal   Collection Time: 12/04/22  3:09 AM  Result Value Ref Range   Hgb A1c MFr Bld 6.7 (H) 4.8 - 5.6 %    Comment: (NOTE) Pre diabetes:          5.7%-6.4%  Diabetes:              >6.4%  Glycemic control for   <7.0% adults with diabetes    Mean Plasma Glucose 145.59 mg/dL    Comment: Performed at [facility], [address] [place], [place] [address]  TSH     Status: None   Collection Time: 12/04/22  3:09 AM  Result Value Ref Range   TSH 2.980 0.350 - 4.500 uIU/mL    Comment: Performed by a 3rd Generation assay with a functional sensitivity of <=0.01 uIU/mL. Performed at [facility], [address], [place], [place] [address]    DG Chest 2 View  Result Date: 12/03/2022 CLINICAL DATA:  Shortness of breath and cough. EXAM: CHEST - 2 VIEW COMPARISON:  Radiograph 02/05/2022 FINDINGS: The heart is enlarged. Mediastinal contours are unchanged. There are small bilateral pleural effusions, likely chronic and decreased in size from prior exam, right greater than left. Trace fluid in the fissures. Increased interstitial thickening with septal thickening typical of pulmonary edema. No  pneumothorax. IMPRESSION: Cardiomegaly with pulmonary edema. Small bilateral pleural effusions are likely chronic and improved from December. Electronically Signed   By: Narda Rutherford M.D.   On: 12/03/2022 15:34    PMH:   Past Medical History:  Diagnosis Date   AKI (acute kidney injury) (HCC) 09/12/2018   Stage 4   Anxiety    Bipolar 1 disorder (HCC)    Depression    Diabetes mellitus without complication (HCC)  Diarrhea 11/21/2020   Gallstones    Hyperlipidemia    Hypertension    MDD (major depressive disorder), recurrent episode, moderate (HCC) 11/18/2020   Neuropathy    Schizophrenia (HCC)    Seizures (HCC)    X1- febrile seizure as a child-none since   Suicide ideation 09/12/2018    PSH:   Past Surgical History:  Procedure Laterality Date   AV FISTULA PLACEMENT Left 06/03/2022   Procedure: INSERTION OF ARTERIOVENOUS (AV) GORE-TEX GRAFT LEFT ARM;  Surgeon: Leonie Douglas, MD;  Location: [facility] OR;  Service: Vascular;  Laterality: Left;   CESAREAN SECTION  05/1998   X 1   CHOLECYSTECTOMY N/A 03/27/2016   Procedure: LAPAROSCOPIC CHOLECYSTECTOMY;  Surgeon: Henrene Dodge, MD;  Location: [facility] ORS;  Service: General;  Laterality: N/A;   TONSILLECTOMY AND ADENOIDECTOMY     age 81   TUBAL LIGATION      Allergies:  Allergies  Allergen Reactions   Lisinopril Other (See Comments)    Patient does not wish to take this medication (she heard that it can make your kidneys worse)   Amoxicillin Rash    Medications:   Prior to Admission medications   Medication Sig Start Date End Date Taking? Authorizing Provider  amLODipine (NORVASC) 10 MG tablet Take 1 tablet (10 mg total) by mouth daily. 07/22/22 12/03/22 Yes Cristopher Peru, PA-C  fluticasone (FLONASE) 50 MCG/ACT nasal spray Place 1 spray into both nostrils daily. 10/16/21 12/03/22 Yes Steffanie Rainwater, MD  hydrALAZINE (APRESOLINE) 25 MG tablet Take 25 mg by mouth 3 (three) times daily. 04/17/22  Yes [provider]   loperamide (IMODIUM) 2 MG capsule Take 1 capsule (2 mg total) by mouth 4 (four) times daily as needed for diarrhea or loose stools. 07/22/22  Yes Cristopher Peru, PA-C  ondansetron (ZOFRAN) 4 MG tablet Take 1 tablet (4 mg total) by mouth every 6 (six) hours. 07/22/22  Yes Cristopher Peru, PA-C  QUEtiapine (SEROQUEL) 50 MG tablet Take 1 tablet (50 mg total) by mouth at bedtime. 02/13/22  Yes Mapp, Gaylyn Cheers, MD    Discontinued Meds:   Medications Discontinued During This Encounter  Medication Reason   carvedilol (COREG) 6.25 MG tablet Patient Preference   valsartan (DIOVAN) 320 MG tablet Patient Preference   oxyCODONE-acetaminophen (PERCOCET/ROXICET) 5-325 MG tablet Patient Preference   feeding supplement (ENSURE ENLIVE / ENSURE PLUS) liquid 237 mL    nitroGLYCERIN 50 mg in dextrose 5 % 250 mL (0.2 mg/mL) infusion     Social History:  reports that she quit smoking about 9 years ago. Her smoking use included cigarettes. She has never used smokeless tobacco. She reports that she does not currently use alcohol after a past usage of about 1.0 standard drink of alcohol per week. She reports that she does not use drugs.  Family History:   Family History  Problem Relation Age of Onset   Diabetes Mother    Heart disease Mother    Kidney disease Mother    Stroke Mother    Hyperlipidemia Mother    Diabetes Maternal Aunt     Blood pressure (!) 163/63, pulse 78, temperature 98.7 F (37.1 C), temperature source Oral, resp. rate 19, height 5\' 2"  (1.575 m), weight 56.4 kg, SpO2 97%. General appearance: alert, cooperative, and appears stated age Head: Normocephalic, without obvious abnormality, atraumatic Eyes: negative Back: symmetric, no curvature. ROM normal. No CVA tenderness. Resp: clear to auscultation bilaterally Cardio: regular rate and rhythm, S1, S2 normal, no murmur, click, rub  or gallop GI: soft, non-tender; bowel sounds normal; no masses,  no organomegaly Extremities: edema none Pulses:  2+ and symmetric Skin: Skin color, texture, turgor normal. No rashes or lesions Access: LUA AVG good thrill     Liandra Mendia, Len Blalock, MD 12/04/2022, 12:22 PM

## 2022-12-04 NOTE — Discharge Instructions (Addendum)
Chronic Kidney Disease Stage 3-5 Nutrition Therapy  Choosing healthy food, staying physically active and taking medicines as prescribed by your health care provider may help slow down the progression of kidney disease. There is not one eating plan that is right for everyone with kidney disease. Your registered dietitian nutritionist (RDN) will help you identify what's best for you to eat.  Why is nutrition important in kidney disease? Your kidneys help keep nutrients and minerals balanced in your body and remove the waste products from your blood. With kidney disease, your kidneys may not be able to do this job very well. You may need to make some changes to your diet. You may need to control the amount of protein, sodium, potassium, phosphorus or calcium in your diet. You will also still need to follow diet recommendations for any other conditions you have, like heart disease or diabetes. Fortunately, these diets are similar. Your nutrition care plan might change over time depending on the status of your condition. Your registered dietitian nutritionist  (RDN) or health care provider will tell you if changes are needed based on your blood test results.   Tips How to plan a kidney-friendly meal Fill a 9-inch or 10-inch plate with: Fruits and/or vegetables Breads, cereals, or grains Protein   A healthy fat Your body needs protein to help build muscle, repair tissue, and fight infection. If you have kidney disease, eating less protein can help protect your kidneys if you are not on dialysis. The most effective way to protect your health is to eat less red meat such as beef or pork and smaller protein portions and to choose plant-proteins as a meat alternative. If you are on dialysis, the protein serving is the size of your palm. If you are not on dialysis, the protein serving is 1/3 to  the size of your palm. Your RDN will discuss how much protein you should eat. Eat at least 6 servings of grain  daily and choose whole grains for at least half those servings. Fruits and vegetables are an important part of a healthy diet and help increase your intake of fiber. Eat at least 5 servings of fresh, frozen, or canned fruits and vegetables daily. These foods are a source of potassium but you only need to limit how much you eat if your potassium level is high. Products labeled as "low sodium" may use potassium chloride in place of sodium. Check the ingredient list to make sure you can safely eat low-sodium foods. You can enjoy ____servings of dairy and dairy alternatives. Your RDN will make a specific recommendation based on your individual needs. Your health care provider will let you know if you need to limit fluid intake. Less fluid will help you manage urine output, and avoid fluid retention which can cause shortness of breath, swelling, high blood pressure, and increased strain on your heart and blood vessels.     Nutrients to Monitor You may need to pay attention to sodium, phosphorus, and potassium in your diet. Your RDN can provide you handouts on potassium and/or phosphorus for more details and strategies to manage these nutrients.  Tips to limit sodium: Eat home-cooked meals made from fresh ingredients. Choose foods and condiments with 200 milligrams of sodium or less per serving. Use frozen or packaged meals with 600 milligrams or less sodium per serving if you are too tired to cook. Check labels to avoid foods that have more than 200 milligrams of sodium per serving. These foods may include canned  soups or soup mixes, packaged foods, pickled foods, sauces, and seasonings. Limit how much salt you add to foods or avoid it altogether. Salt-free seasonings like herbs, spices, lemon juice, and vinegar will flavor to your food without adding salt. Ask your RDN which frozen and convenience foods, fast foods, or restaurant meals may be ok for you.  If you also have diabetes and/or heart  disease: It is easy to manage these multiple diets because they are similar in many ways. Eat a variety of healthy foods. Choose whole grain foods. Eat a moderate amount of protein and choose low-fat, lean, and heart-healthy options. Eat at least 5 servings/day of fruits and vegetables. Your blood potassium level will affect which fruits and vegetables you can safely eat. Eat less food with added salt, sugars, and fats.  Your RDN can provide you with additional recommendations if necessary.    Foods to Choose or Limit Your RDN will tell you if you need to limit phosphorus or potassium in your diet and provide you separate handouts about foods to choose or limit. Food Group Choose Limit  Grains Whole grain cereal Oats, oatmeal Whole wheat bread, pita English muffin Corn tortillas Whole wheat pasta Brown rice Quinoa Couscous Grits Popcorn Rice cakes Whole wheat crackers   Grains with more than 200 milligrams of sodium per serving Boxed biscuit, cake, pancake/waffle mixes and other convenience foods Snacks and sweets should be eaten in moderation    Protein Foods Eggs or egg whites Lean beef, wild game, and "all natural" chicken, fish, pork, seafood, or [place] Legumes/Pulses: Beans (such as black, kidney, or white beans), lentils, split peas, black-eyed peas Soy: Tofu, edamame Nuts and nut butters Protein with more than 200 mg sodium per serving Processed or frozen protein foods   Salty processed meats (such as bacon, bologna, salami and other lunch meats),  ham, hot dogs,  sausage, breakfast sausage, and pre-seasoned meats    Dairy and Dairy Alternatives Lower-phosphorus milk alternatives include unfortified almond, rice, soy or other plant beverages Lower-phosphorus cheeses include brie, goat, cream cheese, mozzarella, parmesan, or ricotta cheese Processed cheeses, such as American cheese, cheese spreads, boxed macaroni and cheese Milk-based or cheese-based soups or  sauces Nondairy creamers  Vegetables Fresh, frozen, or no-salt added canned vegetables Processed vegetables or vegetable juice with more than 200 milligrams sodium per serving. Pickled foods, such as olives, sauerkraut, pickles, kimchi Vegetables with added sauces  Fruit Fresh, frozen, or canned fruit Canned fruit in syrup or with added sugars  Fats and Oils Healthy fats such as olive oil, vegetable oil or lower sodium salad dressings Butter, margarine, mayonnaise, and sour cream in moderation Dressing, condiments and other sauces with more than 200 mg of sodium per serving  Beverages/ Fluids   (Fluids include anything that is liquid at room temperature. You may need to limit how much you drink if you are producing less urine.)   Water Coffee Tea Lemonade Seltzer Processed beverages (such as most colas, sports drinks, energy drinks, some flavored waters, drink mixes., some bottled teas and others) Canned soups with more than 200 mg sodium per serving Beer and wine    Other Herbs, spices, lemon juice, vinegars to flavor food instead of salt Stocks or broths labeled as "no salt added" Condiments and sauces with less than 200 mg sodium per serving Salt, and salt substitutes Bouillon and broths with more than 200 milligrams per serving Broths and soups labeled as "low sodium" Condiments and sauces with more than 200 mg  sodium per serving   Chronic Kidney Disease Stage 3-5 (Not on Dialysis) Sample 1-Day Menu View Nutrient Info Breakfast 1 egg, hard-boiled 1 cup oatmeal 1 cup blueberries 1 cup coffee  Lunch Sandwich made with: 2 slices whole wheat bread 1 ounce [place], sliced 2 leaves lettuce 1 teaspoon mustard 1 tablespoon mayonnaise 1 cup carrots, raw 1 apple 1 cup water with lemon  Afternoon Snack 2 tablespoons hummus 4 celery sticks  Evening Meal 2 ounces fish, broiled 1 cup brown rice, cooked  cup green peppers, sauted  cup mushrooms, sauted 2 tablespoons olive oil   cup green beans  cup peaches  Evening Snack 3 cups popcorn, air-popped 1 pear  Daily Sum Nutrient Unit Value  Macronutrients  Energy kcal 1611  Energy kJ 6744  Protein g 57  Total lipid (fat) g 59  Carbohydrate, by difference g 226  Fiber, total dietary g 38  Sugars, total g 70  Minerals  Calcium, Ca mg 354  Iron, Fe mg 10  Sodium, Na mg 835  Vitamins  Vitamin C, total ascorbic acid mg 77  Vitamin A, IU IU 22712  Vitamin D IU 74  Lipids  Fatty acids, total saturated g 10  Fatty acids, total monounsaturated g 29  Fatty acids, total polyunsaturated g 16  Cholesterol mg 239     Chronic Kidney Disease Stage 3-5 Vegan Sample 1-Day Menu View Nutrient Info Breakfast 1 cup cooked oatmeal 1 slice whole wheat toast 2 teaspoons margarine, soft, tub  cup blueberries 1 cup soy milk 1 cup coffee  Lunch 1 whole wheat pita bread  cup hummus 1 large apple  cup sliced cucumber  Evening Meal Stir-fry made with:  cup tofu  cup mung beans 1 cup rice  cup cabbage  cup carrots 1 tablespoon peanut oil  cup sliced peaches 1 cup iced tea  Evening Snack 1 cup soy yogurt 1 cup air-popped popcorn 1 tablespoon olive oil  Daily Sum Nutrient Unit Value  Macronutrients  Energy kcal 1730  Energy kJ 7234  Protein g 58  Total lipid (fat) g 63  Carbohydrate, by difference g 249  Fiber, total dietary g 30  Sugars, total g 77  Minerals  Calcium, Ca mg 900  Iron, Fe mg 13  Sodium, Na mg 1052  Vitamins  Vitamin C, total ascorbic acid mg 75  Vitamin A, IU IU 10060  Vitamin D IU 120  Lipids  Fatty acids, total saturated g 11  Fatty acids, total monounsaturated g 26  Fatty acids, total polyunsaturated g 18  Cholesterol mg 0     Chronic Kidney Disease Stage 3-5 Vegetarian (Lacto-Ovo) Sample 1-Day Menu View Nutrient Info Breakfast 1 cup cooked oatmeal 1 slice whole wheat toast 2 teaspoons margarine, soft, tub 1 scrambled egg  cup blueberries  cup soy milk 1 cup coffee   Lunch 2 slices whole wheat bread 2 tablespoons peanut butter 1 cup lettuce  cup sliced cucumber 1 tablespoon oil and vinegar salad dressing 1 large apple  Evening Meal Stir-fry made with:  cup tofu 1 cup rice  cup canned bamboo shoots  cup carrots 1 tablespoon peanut oil  cup sliced peaches 1 cup iced tea  Evening Snack 1 cup air-popped popcorn 1 tablespoon olive oil  Daily Sum Nutrient Unit Value  Macronutrients  Energy kcal 1693  Energy kJ 7076  Protein g 54  Total lipid (fat) g 81  Carbohydrate, by difference g 201  Fiber, total dietary g 27  Sugars, total g 61  Minerals  Calcium, Ca mg 585  Iron, Fe mg 14  Sodium, Na mg 742  Vitamins  Vitamin C, total ascorbic acid mg 32  Vitamin A, IU IU 12180  Vitamin D IU 44  Lipids  Fatty acids, total saturated g 16  Fatty acids, total monounsaturated g 36  Fatty acids, total polyunsaturated g 24  Cholesterol mg 169     Chronic Kidney Disease Stage 5 (on Dialysis) Sample 1-Day Menu View Nutrient Info Breakfast 1 egg, hard-boiled 1 cup oatmeal  cup blueberries  cup soy yogurt 1 cup coffee  Lunch Sandwich made with: 2 slices whole wheat bread 2 ounces [place], sliced 2 leaves lettuce 1 teaspoon mustard 1 tablespoon mayonnaise 1 cup carrots, raw 1 apple 1 cup water with lemon  Afternoon Snack 2 tablespoons peanut butter 4 celery sticks  Evening Meal 3 ounces fish, broiled  cup brown rice, cooked  cup green peppers, sauted  cup mushrooms, sauted 1 tablespoon olive oil  cup green beans  cup peaches  Evening Snack 3 cups popcorn, air-popped topped with: 1 teaspoon parmesan cheese 1 pear  Daily Sum Nutrient Unit Value  Macronutrients  Energy kcal 1713  Energy kJ 7170  Protein g 79  Total lipid (fat) g 64  Carbohydrate, by difference g 220  Fiber, total dietary g 36  Sugars, total g 84  Minerals  Calcium, Ca mg 664  Iron, Fe mg 11  Sodium, Na mg 835  Vitamins  Vitamin C, total ascorbic  acid mg 100  Vitamin A, IU IU 22694  Vitamin D IU 91  Lipids  Fatty acids, total saturated g 12  Fatty acids, total monounsaturated g 27  Fatty acids, total polyunsaturated g 18  Cholesterol mg 278    Copyright 2020  [employer]. All rights reserved    Heart Healthy, Consistent Carbohydrate Nutrition Therapy   A heart-healthy and consistent carbohydrate diet is recommended to manage heart disease and diabetes. To follow a heart-healthy and consistent carbohydrate diet, Eat a balanced diet with whole grains, fruits and vegetables, and lean protein sources.  Choose heart-healthy unsaturated fats. Limit saturated fats, trans fats, and cholesterol intake. Eat more plant-based or vegetarian meals using beans and soy foods for protein.  Eat whole, unprocessed foods to limit the amount of sodium (salt) you eat.  Choose a consistent amount of carbohydrate at each meal and snack. Limit refined carbohydrates especially sugar, sweets and sugar-sweetened beverages.  If you drink alcohol, do so in moderation: one serving per day (women) and two servings per day (men). o One serving is equivalent to 12 ounces beer, 5 ounces wine, or 1.5 ounces distilled spirits  Tips Tips for Choosing Heart-Healthy Fats Choose lean protein and low-fat dairy foods to reduce saturated fat intake. Saturated fat is usually found in animal-based protein and is associated with certain health risks. Saturated fat is the biggest contributor to raise low-density lipoprotein (LDL) cholesterol levels. Research shows that limiting saturated fat lowers unhealthy cholesterol levels. Eat no more than 7% of your total calories each day from saturated fat. Ask your RDN to help you determine how much saturated fat is right for you. There are many foods that do not contain large amounts of saturated fats. Swapping these foods to replace foods high in saturated fats will help you limit the saturated fat you eat and  improve your cholesterol levels. You can also try eating more plant-based or vegetarian meals. Instead of. Try:  Whole milk, cheese, yogurt, and  ice cream 1% or skim milk, low-fat cheese, non-fat yogurt, and low-fat ice cream  Fatty, marbled beef and pork Lean beef, pork, or venison  Poultry with skin Poultry without skin  Butter, stick margarine Reduced-fat, whipped, or liquid spreads  Coconut oil, palm oil Liquid vegetable oils: corn, canola, olive, soybean and safflower oils   Avoid foods that contain trans fats. Trans fats increase levels of LDL-cholesterol. Hydrogenated fat in processed foods is the main source of trans fats in foods.  Trans fats can be found in stick margarine, shortening, processed sweets, baked goods, some fried foods, and packaged foods made with hydrogenated oils. Avoid foods with "partially hydrogenated oil" on the ingredient list such as: cookies, pastries, baked goods, biscuits, crackers, microwave popcorn, and frozen dinners. Choose foods with heart healthy fats. Polyunsaturated and monounsaturated fat are unsaturated fats that may help lower your blood cholesterol level when used in place of saturated fat in your diet. Ask your RDN about taking a dietary supplement with plant sterols and stanols to help lower your cholesterol level. Research shows that substituting saturated fats with unsaturated fats is beneficial to cholesterol levels. Try these easy swaps: Instead of. Try:  Butter, stick margarine, or solid shortening Reduced-fat, whipped, or liquid spreads  Beef, pork, or poultry with skin Fish and seafood  Chips, crackers, snack foods Raw or unsalted nuts and seeds or nut butters Hummus with vegetables Avocado on toast  Coconut oil, palm oil Liquid vegetable oils: corn, canola, olive, soybean and safflower oils  Limit the amount of cholesterol you eat to less than 200 milligrams per day. Cholesterol is a substance carried through the bloodstream via  lipoproteins, which are known as "transporters" of fat. Some body functions need cholesterol to work properly, but too much cholesterol in the bloodstream can damage arteries and build up blood vessel linings (which can lead to heart attack and stroke). You should eat less than 200 milligrams cholesterol per day. People respond differently to eating cholesterol. There is no test available right now that can figure out which people will respond more to dietary cholesterol and which will respond less. For individuals with high intake of dietary cholesterol, different types of increase (none, small, moderate, large) in LDL-cholesterol levels are all possible.  Food sources of cholesterol include egg yolks and organ meats such as liver, gizzards. Limit egg yolks to two to four per week and avoid organ meats like liver and gizzards to control cholesterol intake. Tips for Choosing Heart-Healthy Carbohydrates Consume a consistent amount of carbohydrate It is important to eat foods with carbohydrates in moderation because they impact your blood glucose level. Carbohydrates can be found in many foods such as: Grains (breads, crackers, rice, pasta, and cereals)  Starchy Vegetables (potatoes, corn, and peas)  Beans and legumes  Milk, soy milk, and yogurt  Fruit and fruit juice  Sweets (cakes, cookies, ice cream, jam and jelly) Your RDN will help you set a goal for how many carbohydrate servings to eat at your meals and snacks. For many adults, eating 3 to 5 servings of carbohydrate foods at each meal and 1 or 2 carbohydrate servings for each snack works well.  Check your blood glucose level regularly. It can tell you if you need to adjust when you eat carbohydrates. Choose foods rich in viscous (soluble) fiber Viscous, or soluble, is found in the walls of plant cells. Viscous fiber is found only in plant-based foods. Eating foods with fiber helps to lower your unhealthy cholesterol and keep  your blood glucose in  range  Rich sources of viscous fiber include vegetables (asparagus, Brussels sprouts, sweet potatoes, turnips) fruit (apricots, mangoes, oranges), legumes, and whole grains (barley, oats, and oat bran).  As you increase your fiber intake gradually, also increase the amount of water you drink. This will help prevent constipation.  If you have difficulty achieving this goal, ask your RDN about fiber laxatives. Choose fiber supplements made with viscous fibers such as psyllium seed husks or methylcellulose to help lower unhealthy cholesterol.  Limit refined carbohydrates  There are three types of carbohydrates: starches, sugar, and fiber. Some carbohydrates occur naturally in food, like the starches in rice or corn or the sugars in fruits and milk. Refined carbohydrates--foods with high amounts of simple sugars--can raise triglyceride levels. High triglyceride levels are associated with coronary heart disease. Some examples of refined carbohydrate foods are table sugar, sweets, and beverages sweetened with added sugar. Tips for Reducing Sodium (Salt) Although sodium is important for your body to function, too much sodium can be harmful for people with high blood pressure. As sodium and fluid buildup in your tissues and bloodstream, your blood pressure increases. High blood pressure may cause damage to other organs and increase your risk for a stroke. Even if you take a pill for blood pressure or a water pill (diuretic) to remove fluid, it is still important to have less salt in your diet. Ask your doctor and RDN what amount of sodium is right for you. Avoid processed foods. Eat more fresh foods.  Fresh fruits and vegetables are naturally low in sodium, as well as frozen vegetables and fruits that have no added juices or sauces.  Fresh meats are lower in sodium than processed meats, such as bacon, sausage, and hotdogs. Read the nutrition label or ask your butcher to help you find a fresh meat that is low in  sodium. Eat less salt--at the table and when cooking.  A single teaspoon of table salt has 2,300 mg of sodium.  Leave the salt out of recipes for pasta, casseroles, and soups.  Ask your RDN how to cook your favorite recipes without sodium Be a smart shopper.  Look for food packages that say "salt-free" or "sodium-free." These items contain less than 5 milligrams of sodium per serving.  "Very low-sodium" products contain less than 35 milligrams of sodium per serving.  "Low-sodium" products contain less than 140 milligrams of sodium per serving.  Beware for "Unsalted" or "No Added Salt" products. These items may still be high in sodium. Check the nutrition label. Add flavors to your food without adding sodium.  Try lemon juice, lime juice, fruit juice or vinegar.  Dry or fresh herbs add flavor. Try basil, bay leaf, dill, rosemary, parsley, sage, dry mustard, nutmeg, thyme, and paprika.  Pepper, red pepper flakes, and cayenne pepper can add spice t your meals without adding sodium. Hot sauce contains sodium, but if you use just a drop or two, it will not add up to much.  Buy a sodium-free seasoning blend or make your own at home. Additional Lifestyle Tips Achieve and maintain a healthy weight. Talk with your RDN or your doctor about what is a healthy weight for you. Set goals to reach and maintain that weight.  To lose weight, reduce your calorie intake along with increasing your physical activity. A weight loss of 10 to 15 pounds could reduce LDL-cholesterol by 5 milligrams per deciliter. Participate in physical activity. Talk with your health care team to find  out what types of physical activity are best for you. Set a plan to get about 30 minutes of exercise on most days.  Foods Recommended Food Group Foods Recommended  Grains Whole grain breads and cereals, including whole wheat, barley, rye, buckwheat, corn, teff, quinoa, millet, amaranth, brown or wild rice, sorghum, and oats Pasta,  especially whole wheat or other whole grain types  [employer], quinoa or wild rice Whole grain crackers, bread, rolls, pitas Home-made bread with reduced-sodium baking soda  Protein Foods Lean cuts of beef and pork (loin, leg, round, extra lean hamburger)  Skinless [employer] photographer and other wild game Dried beans and peas Nuts and nut butters Meat alternatives made with soy or textured vegetable protein  Egg whites or egg substitute Cold cuts made with lean meat or soy protein  Dairy Nonfat (skim), low-fat, or 1%-fat milk  Nonfat or low-fat yogurt or cottage cheese Fat-free and low-fat cheese  Vegetables Fresh, frozen, or canned vegetables without added fat or salt   Fruits Fresh, frozen, canned, or dried fruit   Oils Unsaturated oils (corn, olive, peanut, soy, sunflower, canola)  Soft or liquid margarines and vegetable oil spreads  Salad dressings Seeds and nuts  Avocado   Foods Not Recommended Food Group Foods Not Recommended  Grains Breads or crackers topped with salt Cereals (hot or cold) with more than 300 mg sodium per serving Biscuits, cornbread, and other "quick" breads prepared with baking soda Bread crumbs or stuffing mix from a store High-fat bakery products, such as doughnuts, biscuits, croissants, danish pastries, pies, cookies Instant cooking foods to which you add hot water and stir--potatoes, noodles, rice, etc. Packaged starchy foods--seasoned noodle or rice dishes, stuffing mix, macaroni and cheese dinner Snacks made with partially hydrogenated oils, including chips, cheese puffs, snack mixes, regular crackers, butter-flavored popcorn  Protein Foods Higher-fat cuts of meats (ribs, t-bone steak, regular hamburger) Bacon, sausage, or hot dogs Cold cuts, such as salami or bologna, deli meats, cured meats, corned beef Organ meats (liver, brains, gizzards, sweetbreads) Poultry with skin Fried or smoked meat, poultry, and fish Whole eggs and egg yolks (more  than 2-4 per week) Salted legumes, nuts, seeds, or nut/seed butters Meat alternatives with high levels of sodium (>300 mg per serving) or saturated fat (>5 g per serving)  Dairy Whole milk,?2% fat milk, buttermilk Whole milk yogurt or ice cream Cream Half-&-half Cream cheese Sour cream Cheese  Vegetables Canned or frozen vegetables with salt, fresh vegetables prepared with salt, butter, cheese, or cream sauce Fried vegetables Pickled vegetables such as olives, pickles, or sauerkraut  Fruits Fried fruits Fruits served with butter or cream  Oils Butter, stick margarine, shortening Partially hydrogenated oils or trans fats Tropical oils (coconut, palm, palm kernel oils)  Other Candy, sugar sweetened soft drinks and desserts Salt, sea salt, garlic salt, and seasoning mixes containing salt Bouillon cubes Ketchup, barbecue sauce, Worcestershire sauce, soy sauce, teriyaki sauce Miso Salsa Pickles, olives, relish   Heart Healthy Consistent Carbohydrate Vegetarian (Lacto-Ovo) Sample 1-Day Menu  Breakfast 1 cup oatmeal, cooked (2 carbohydrate servings)   cup blueberries (1 carbohydrate serving)  11 almonds, without salt  1 cup 1% milk (1 carbohydrate serving)  1 cup coffee  Morning Snack 1 cup fat-free plain yogurt (1 carbohydrate serving)  Lunch 1 whole wheat bun (1 carbohydrate servings)  1 black bean burger (1 carbohydrate servings)  1 slice cheddar cheese, low sodium  2 slices tomatoes  2 leaves lettuce  1 teaspoon mustard  1 small  pear (1 carbohydrate servings)  1 cup green tea, unsweetened  Afternoon Snack 1/3 cup trail mix with nuts, seeds, and raisins, without salt (1 carbohydrate servinga)  Evening Meal  cup meatless chicken  2/3 cup brown rice, cooked (2 carbohydrate servings)  1 cup broccoli, cooked (2/3 carbohydrate serving)   cup carrots, cooked (1/3 carbohydrate serving)  2 teaspoons olive oil  1 teaspoon balsamic vinegar  1 whole wheat dinner roll (1  carbohydrate serving)  1 teaspoon margarine, soft, tub  1 cup 1% milk (1 carbohydrate serving)  Evening Snack 1 extra small banana (1 carbohydrate serving)  1 tablespoon peanut butter   Heart Healthy Consistent Carbohydrate Vegan Sample 1-Day Menu  Breakfast 1 cup oatmeal, cooked (2 carbohydrate servings)   cup blueberries (1 carbohydrate serving)  11 almonds, without salt  1 cup soymilk fortified with calcium, vitamin B12, and vitamin D  1 cup coffee  Morning Snack 6 ounces soy yogurt (1 carbohydrate servings)  Lunch 1 whole wheat bun(1 carbohydrate servings)  1 black bean burger (1 carbohydrate serving)  2 slices tomatoes  2 leaves lettuce  1 teaspoon mustard  1 small pear (1 carbohydrate servings)  1 cup green tea, unsweetened  Afternoon Snack 1/3 cup trail mix with nuts, seeds, and raisins, without salt (1 carbohydrate servings)  Evening Meal  cup meatless chicken  2/3 cup brown rice, cooked (2 carbohydrate servings)  1 cup broccoli, cooked (2/3 carbohydrate serving)   cup carrots, cooked (1/3 carbohydrate serving)  2 teaspoons olive oil  1 teaspoon balsamic vinegar  1 whole wheat dinner roll (1 carbohydrate serving)  1 teaspoon margarine, soft, tub  1 cup soymilk fortified with calcium, vitamin B12, and vitamin D  Evening Snack 1 extra small banana (1 carbohydrate serving)  1 tablespoon peanut butter    Heart Healthy Consistent Carbohydrate Sample 1-Day Menu  Breakfast 1 cup cooked oatmeal (2 carbohydrate servings)  3/4 cup blueberries (1 carbohydrate serving)  1 ounce almonds  1 cup skim milk (1 carbohydrate serving)  1 cup coffee  Morning Snack 1 cup sugar-free nonfat yogurt (1 carbohydrate serving)  Lunch 2 slices whole-wheat bread (2 carbohydrate servings)  2 ounces lean [place] breast  1 ounce low-fat Swiss cheese  1 teaspoon mustard  1 slice tomato  1 lettuce leaf  1 small pear (1 carbohydrate serving)  1 cup skim milk (1 carbohydrate serving)   Afternoon Snack 1 ounce trail mix with unsalted nuts, seeds, and raisins (1 carbohydrate serving)  Evening Meal 3 ounces salmon  2/3 cup cooked brown rice (2 carbohydrate servings)  1 teaspoon soft margarine  1 cup cooked broccoli with 1/2 cup cooked carrots (1 carbohydrate serving  Carrots, cooked, boiled, drained, without salt  1 cup lettuce  1 teaspoon olive oil with vinegar for dressing  1 small whole grain roll (1 carbohydrate serving)  1 teaspoon soft margarine  1 cup unsweetened tea  Evening Snack 1 extra-small banana (1 carbohydrate serving)  Copyright 2020  [employer]. All rights reserved.

## 2022-12-05 ENCOUNTER — Inpatient Hospital Stay (HOSPITAL_COMMUNITY): Payer: MEDICAID

## 2022-12-05 DIAGNOSIS — E440 Moderate protein-calorie malnutrition: Secondary | ICD-10-CM | POA: Insufficient documentation

## 2022-12-05 DIAGNOSIS — I5031 Acute diastolic (congestive) heart failure: Secondary | ICD-10-CM | POA: Diagnosis not present

## 2022-12-05 DIAGNOSIS — I5033 Acute on chronic diastolic (congestive) heart failure: Secondary | ICD-10-CM | POA: Diagnosis not present

## 2022-12-05 LAB — CBC
HCT: 27.8 % — ABNORMAL LOW (ref 36.0–46.0)
Hemoglobin: 9 g/dL — ABNORMAL LOW (ref 12.0–15.0)
MCH: 29.7 pg (ref 26.0–34.0)
MCHC: 32.4 g/dL (ref 30.0–36.0)
MCV: 91.7 fL (ref 80.0–100.0)
Platelets: 175 10*3/uL (ref 150–400)
RBC: 3.03 MIL/uL — ABNORMAL LOW (ref 3.87–5.11)
RDW: 13.9 % (ref 11.5–15.5)
WBC: 5.2 10*3/uL (ref 4.0–10.5)
nRBC: 0 % (ref 0.0–0.2)

## 2022-12-05 LAB — BILIRUBIN, TOTAL: Total Bilirubin: 0.4 mg/dL (ref 0.3–1.2)

## 2022-12-05 LAB — ECHOCARDIOGRAM COMPLETE
AR max vel: 1.42 cm2
AV Area VTI: 1.43 cm2
AV Area mean vel: 1.4 cm2
AV Mean grad: 6 mm[Hg]
AV Peak grad: 10.5 mm[Hg]
Ao pk vel: 1.62 m/s
Area-P 1/2: 4.21 cm2
Height: 62 in
S' Lateral: 3 cm
Weight: 2003.54 [oz_av]

## 2022-12-05 LAB — RENAL FUNCTION PANEL
Albumin: 3.1 g/dL — ABNORMAL LOW (ref 3.5–5.0)
Anion gap: 10 (ref 5–15)
BUN: 70 mg/dL — ABNORMAL HIGH (ref 8–23)
CO2: 24 mmol/L (ref 22–32)
Calcium: 8.7 mg/dL — ABNORMAL LOW (ref 8.9–10.3)
Chloride: 103 mmol/L (ref 98–111)
Creatinine, Ser: 6.21 mg/dL — ABNORMAL HIGH (ref 0.44–1.00)
GFR, Estimated: 7 mL/min — ABNORMAL LOW (ref >60)
Glucose, Bld: 129 mg/dL — ABNORMAL HIGH (ref 70–99)
Phosphorus: 4.2 mg/dL (ref 2.5–4.6)
Potassium: 4 mmol/L (ref 3.5–5.1)
Sodium: 137 mmol/L (ref 135–145)

## 2022-12-05 LAB — ALKALINE PHOSPHATASE: Alkaline Phosphatase: 61 U/L (ref 38–126)

## 2022-12-05 LAB — ALT: ALT: 32 U/L (ref 0–44)

## 2022-12-05 LAB — AST: AST: 20 U/L (ref 15–41)

## 2022-12-05 LAB — PROTEIN, TOTAL: Total Protein: 5.8 g/dL — ABNORMAL LOW (ref 6.5–8.1)

## 2022-12-05 MED ORDER — SODIUM CHLORIDE 0.9 % IV SOLN
100.0000 mg | Freq: Once | INTRAVENOUS | Status: AC
  Administered 2022-12-05: 100 mg via INTRAVENOUS
  Filled 2022-12-05: qty 5

## 2022-12-05 MED ORDER — MELATONIN 3 MG PO TABS
3.0000 mg | ORAL_TABLET | Freq: Every evening | ORAL | Status: DC | PRN
  Administered 2022-12-05 (×2): 3 mg via ORAL
  Filled 2022-12-05 (×2): qty 1

## 2022-12-05 NOTE — Progress Notes (Signed)
[facility] Progress Note   62 y.o. female schizoaffective disorder, HTN, HLD, depression, anxiety with h/o homelessness + CKD 5. Initially seen in 01/2022 in the hospital with Cr in the 9's with CKD thought to be progression in the setting of poorly controlled HTN. Patient last seen at [facility] clinic on 08/20/2022 with SBP noted to be 210 with noncompliance running  out of her medications. Creatinine was 5.4 at time of clinic visit. Kidney u/s 02/09/22 showed decreased volumes but no hydronephrosis. Weight at [facility] clinic visit on 08/20/22 was 126.6 lbs. P/W SOB having been off all her meds for several months secondary to being depressed. Decreased exercise tolerance as well with nonproductive cough, orthopnea. She denies fever, chills, nausea, chest pain, weakness, decreased UOP or dysuria.    BP in the ED was 235/83 HB 10.2 K 4.1 BUN/Cr 62/5.1 BNP 1830. Started on a nitroglycerin drip and given Lasix 40mg .    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  - She's feeling much better and understands she needs to take her medications. She actually asked what she can do to delay having to start dialysis. I educated her and most important thing she can do is to take her medications. - No indication to initiate dialysis but when needed she has a functional LUA AVG. - Would place her on Lasix 80/40 upon d/c and we can schedule a 2 week f/u for her. Office already notified and will call her; I also confirmed her cell and let the office know. [phone])  Clinically she's much better and ambulating in the hallway.  She states her next appt is not till the new year but she was supposed to be seen q6weeks.   -Monitor Daily I/Os, Daily weight  -Maintain MAP>65 for optimal renal perfusion.  - Avoid nephrotoxic agents such as IV contrast, NSAIDs, and phosphate containing bowel preps (FLEETS)    Hypertensive emergency - better controlled; goal is 30% drop in the 1st 24 hrs and she is already there. She just needs to take her medications. Renal osteodystrophy - will check a iPTH; phos 4.2. DM - diet controlled Anemia - TSAT 12% F162. Will load w/ IV Fe (order written). HFpEF Schizoaffective Social - currently living with her daughter.  Subjective:   Feeling much better; ambulating hallway without SOB>   Objective:   BP (!) 153/58 (BP Location: Right Arm)   Pulse 67   Temp 98.7 F (37.1 C) (Oral)   Resp 18   Ht 5\' 2"  (1.575 m)   Wt 56.8 kg   SpO2 97%   BMI 22.90 kg/m   Intake/Output Summary (Last 24 hours) at 12/05/2022 1158 Last data filed at 12/04/2022 2030 Gross per 24 hour  Intake 363 ml  Output 300 ml  Net 63 ml   Weight change: -3.2 kg  Physical Exam: GEN: NAD, A&Ox3, NCAT HEENT: No conjunctival pallor, EOMI NECK: Supple, no thyromegaly LUNGS: CTA B/L no rales, rhonchi or wheezing CV: RRR, No M/R/G ABD: SNDNT +BS  EXT: No lower extremity edema Access: LUA AVG good thrill   Imaging: ECHOCARDIOGRAM COMPLETE  Result Date: 12/05/2022    ECHOCARDIOGRAM REPORT   Patient Name:   Lorraine Turner Date of Exam: 12/05/2022 Medical Rec #:  [id number]         Height:       62.0 in Accession #:    [id number]  Weight:       125.2 lb Date of Birth:  1960/08/01         BSA:          1.567 m Patient Age:    61 years          BP:           153/58 mmHg Patient Gender: F                 HR:           70 bpm. Exam Location:  Inpatient Procedure: 2D Echo, Cardiac Doppler, Color Doppler and Strain Analysis Indications:    CHF-Acute Diastolic  History:        Patient has no prior history of Echocardiogram examinations and                 Patient has prior history of Echocardiogram examinations, most                 recent 02/07/2022. Risk Factors:Hypertension and Diabetes.  Sonographer:    Karma Ganja Referring Phys: [id number] RONDELL A SMITH  Sonographer Comments: Global  longitudinal strain was attempted. IMPRESSIONS  1. Left ventricular ejection fraction, by estimation, is 55 to 60%. The left ventricle has normal function. The left ventricle has no regional wall motion abnormalities. There is mild concentric left ventricular hypertrophy. Left ventricular diastolic parameters are consistent with Grade II diastolic dysfunction (pseudonormalization). The average left ventricular global longitudinal strain is -20.5 %. The global longitudinal strain is normal.  2. Right ventricular systolic function is normal. The right ventricular size is normal. There is severely elevated pulmonary artery systolic pressure. The estimated right ventricular systolic pressure is 78.7 mmHg.  3. Left atrial size was mildly dilated.  4. The mitral valve is normal in structure. Trivial mitral valve regurgitation. No evidence of mitral stenosis.  5. Tricuspid valve regurgitation is mild to moderate.  6. The aortic valve is tricuspid. Aortic valve regurgitation is not visualized. No aortic stenosis is present.  7. The inferior vena cava is dilated in size with <50% respiratory variability, suggesting right atrial pressure of 15 mmHg. FINDINGS  Left Ventricle: Left ventricular ejection fraction, by estimation, is 55 to 60%. The left ventricle has normal function. The left ventricle has no regional wall motion abnormalities. The average left ventricular global longitudinal strain is -20.5 %. The global longitudinal strain is normal. The left ventricular internal cavity size was normal in size. There is mild concentric left ventricular hypertrophy. Left ventricular diastolic parameters are consistent with Grade II diastolic dysfunction (pseudonormalization). Right Ventricle: The right ventricular size is normal. No increase in right ventricular wall thickness. Right ventricular systolic function is normal. There is severely elevated pulmonary artery systolic pressure. The tricuspid regurgitant velocity is 3.99  m/s, and with an assumed right atrial pressure of 15 mmHg, the estimated right ventricular systolic pressure is 78.7 mmHg. Left Atrium: Left atrial size was mildly dilated. Right Atrium: Right atrial size was normal in size. Pericardium: There is no evidence of pericardial effusion. Mitral Valve: The mitral valve is normal in structure. Trivial mitral valve regurgitation. No evidence of mitral valve stenosis. Tricuspid Valve: The tricuspid valve is normal in structure. Tricuspid valve regurgitation is mild to moderate. Aortic Valve: The aortic valve is tricuspid. Aortic valve regurgitation is not visualized. No aortic stenosis is present. Aortic valve mean gradient measures 6.0 mmHg. Aortic valve peak gradient measures 10.5 mmHg. Aortic valve area, by VTI measures 1.43  cm.  Pulmonic Valve: The pulmonic valve was normal in structure. Pulmonic valve regurgitation is trivial. Aorta: The aortic root is normal in size and structure. Venous: The inferior vena cava is dilated in size with less than 50% respiratory variability, suggesting right atrial pressure of 15 mmHg. IAS/Shunts: No atrial level shunt detected by color flow Doppler.  LEFT VENTRICLE PLAX 2D LVIDd:         4.60 cm   Diastology LVIDs:         3.00 cm   LV e' medial:    5.87 cm/s LV PW:         1.10 cm   LV E/e' medial:  14.5 LV IVS:        1.20 cm   LV e' lateral:   7.83 cm/s LVOT diam:     1.70 cm   LV E/e' lateral: 10.9 LV SV:         52 LV SV Index:   33        2D Longitudinal Strain LVOT Area:     2.27 cm  2D Strain GLS Avg:     -20.5 %  RIGHT VENTRICLE             IVC RV Basal diam:  4.00 cm     IVC diam: 2.90 cm RV S prime:     12.90 cm/s TAPSE (M-mode): 2.3 cm LEFT ATRIUM            Index        RIGHT ATRIUM           Index LA diam:      4.50 cm  2.87 cm/m   RA Area:     16.70 cm LA Vol (A2C): 115.0 ml 73.40 ml/m  RA Volume:   47.90 ml  30.57 ml/m LA Vol (A4C): 54.8 ml  34.98 ml/m  AORTIC VALVE                     PULMONIC VALVE AV Area  (Vmax):    1.42 cm      PV Vmax:       1.00 m/s AV Area (Vmean):   1.40 cm      PV Vmean:      70.600 cm/s AV Area (VTI):     1.43 cm      PV VTI:        0.230 m AV Vmax:           162.00 cm/s   PV Peak grad:  4.0 mmHg AV Vmean:          109.000 cm/s  PV Mean grad:  2.0 mmHg AV VTI:            0.363 m AV Peak Grad:      10.5 mmHg AV Mean Grad:      6.0 mmHg LVOT Vmax:         101.00 cm/s LVOT Vmean:        67.100 cm/s LVOT VTI:          0.228 m LVOT/AV VTI ratio: 0.63  AORTA Ao Root diam: 2.70 cm Ao Asc diam:  2.70 cm MITRAL VALVE               TRICUSPID VALVE MV Area (PHT): 4.21 cm    TR Peak grad:   63.7 mmHg MV Decel Time: 180 msec    TR Vmax:        399.00 cm/s MV E velocity: 85.40  cm/s MV A velocity: 60.30 cm/s  SHUNTS MV E/A ratio:  1.42        Systemic VTI:  0.23 m                            Systemic Diam: 1.70 cm Dalton McleanMD Electronically signed by Wilfred Lacy Signature Date/Time: 12/05/2022/10:17:37 AM    Final    DG Chest 2 View  Result Date: 12/03/2022 CLINICAL DATA:  Shortness of breath and cough. EXAM: CHEST - 2 VIEW COMPARISON:  Radiograph 02/05/2022 FINDINGS: The heart is enlarged. Mediastinal contours are unchanged. There are small bilateral pleural effusions, likely chronic and decreased in size from prior exam, right greater than left. Trace fluid in the fissures. Increased interstitial thickening with septal thickening typical of pulmonary edema. No pneumothorax. IMPRESSION: Cardiomegaly with pulmonary edema. Small bilateral pleural effusions are likely chronic and improved from December. Electronically Signed   By: Narda Rutherford M.D.   On: 12/03/2022 15:34    Labs: BMET Recent Labs  Lab 12/03/22 1223 12/04/22 0309 12/04/22 1852 12/05/22 0342  NA 140 139  --  137  K 4.1 3.9  --  4.0  CL 106 106  --  103  CO2 23 24  --  24  GLUCOSE 152* 167*  --  129*  BUN 62* 66*  --  70*  CREATININE 5.10* 5.68*  --  6.21*  CALCIUM 9.0 8.5*  --  8.7*  PHOS  --  3.8 4.3 4.2    CBC Recent Labs  Lab 12/03/22 1223 12/04/22 0309 12/05/22 0342  WBC 5.3 5.9 5.2  HGB 10.2* 9.3* 9.0*  HCT 31.7* 28.4* 27.8*  MCV 92.4 90.2 91.7  PLT 190 185 175    Medications:     amLODipine  10 mg Oral Daily   feeding supplement  237 mL Oral BID BM   heparin  5,000 Units Subcutaneous Q8H   hydrALAZINE  25 mg Oral TID   multivitamin with minerals  1 tablet Oral Daily   sodium chloride flush  3 mL Intravenous Q12H      Paulene Floor, MD 12/05/2022, 11:58 AM

## 2022-12-05 NOTE — Progress Notes (Signed)
Heart Failure Navigator Progress Note  Assessed for Heart & Vascular TOC clinic readiness.  Patient does not meet criteria due to EF 55-60%, CKD V, No HF TOC per Dr. Jomarie Longs.   Navigator will sign off at this time.   Rhae Hammock, BSN, Scientist, clinical (histocompatibility and immunogenetics) Only

## 2022-12-05 NOTE — Progress Notes (Signed)
   12/05/22 1123  Spiritual Encounters  Type of Visit Initial  Reason for visit Routine spiritual support   Chaplain attempted visit- knocked on door, no reply- opened door and Pt fast asleep.  Knocked again loudly- no response.  Chaplain will return again this afternoon.  Chaplain services remain available by Spiritual Consult or for emergent cases, paging [phone]  Chaplain Raelene Bott, MDiv [email or web address] [phone]

## 2022-12-05 NOTE — Plan of Care (Signed)
  Problem: Education: Goal: Knowledge of General Education information will improve Description: Including pain rating scale, medication(s)/side effects and non-pharmacologic comfort measures Outcome: Progressing   Problem: Health Behavior/Discharge Planning: Goal: Ability to manage health-related needs will improve Outcome: Progressing   Problem: Clinical Measurements: Goal: Ability to maintain clinical measurements within normal limits will improve Outcome: Progressing   Problem: Clinical Measurements: Goal: Will remain free from infection Outcome: Progressing   Problem: Clinical Measurements: Goal: Will remain free from infection Outcome: Progressing   Problem: Clinical Measurements: Goal: Diagnostic test results will improve Outcome: Progressing   Problem: Clinical Measurements: Goal: Respiratory complications will improve Outcome: Progressing   Problem: Clinical Measurements: Goal: Cardiovascular complication will be avoided Outcome: Progressing
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
## 2022-12-05 NOTE — Progress Notes (Signed)
Echocardiogram 2D Echocardiogram has been performed.  Lorraine Turner P Jhania Etherington 12/05/2022, 10:08 AM

## 2022-12-05 NOTE — Plan of Care (Signed)

## 2022-12-06 ENCOUNTER — Other Ambulatory Visit (HOSPITAL_COMMUNITY): Payer: Self-pay

## 2022-12-06 ENCOUNTER — Telehealth (HOSPITAL_COMMUNITY): Payer: Self-pay

## 2022-12-06 DIAGNOSIS — I5033 Acute on chronic diastolic (congestive) heart failure: Secondary | ICD-10-CM | POA: Diagnosis not present

## 2022-12-06 LAB — RENAL FUNCTION PANEL
Albumin: 3 g/dL — ABNORMAL LOW (ref 3.5–5.0)
Anion gap: 9 (ref 5–15)
BUN: 73 mg/dL — ABNORMAL HIGH (ref 8–23)
CO2: 24 mmol/L (ref 22–32)
Calcium: 8.6 mg/dL — ABNORMAL LOW (ref 8.9–10.3)
Chloride: 104 mmol/L (ref 98–111)
Creatinine, Ser: 6.66 mg/dL — ABNORMAL HIGH (ref 0.44–1.00)
GFR, Estimated: 7 mL/min — ABNORMAL LOW
Glucose, Bld: 107 mg/dL — ABNORMAL HIGH (ref 70–99)
Phosphorus: 4.7 mg/dL — ABNORMAL HIGH (ref 2.5–4.6)
Potassium: 4.5 mmol/L (ref 3.5–5.1)
Sodium: 137 mmol/L (ref 135–145)

## 2022-12-06 LAB — PARATHYROID HORMONE, INTACT (NO CA): PTH: 108 pg/mL — ABNORMAL HIGH (ref 15–65)

## 2022-12-06 MED ORDER — AMLODIPINE BESYLATE 10 MG PO TABS
10.0000 mg | ORAL_TABLET | Freq: Every day | ORAL | 0 refills | Status: DC
  Filled 2022-12-06: qty 30, 30d supply, fill #0

## 2022-12-06 MED ORDER — HYDRALAZINE HCL 25 MG PO TABS
25.0000 mg | ORAL_TABLET | Freq: Three times a day (TID) | ORAL | 0 refills | Status: DC
  Filled 2022-12-06: qty 90, 30d supply, fill #0

## 2022-12-06 MED ORDER — FUROSEMIDE 40 MG PO TABS
40.0000 mg | ORAL_TABLET | Freq: Two times a day (BID) | ORAL | 0 refills | Status: DC
  Filled 2022-12-06: qty 90, 30d supply, fill #0

## 2022-12-06 MED ORDER — ONDANSETRON HCL 4 MG PO TABS
4.0000 mg | ORAL_TABLET | Freq: Three times a day (TID) | ORAL | 0 refills | Status: AC | PRN
  Filled 2022-12-06: qty 20, 7d supply, fill #0

## 2022-12-06 MED ORDER — QUETIAPINE FUMARATE 50 MG PO TABS
50.0000 mg | ORAL_TABLET | Freq: Every day | ORAL | 0 refills | Status: DC
  Filled 2022-12-06: qty 30, 30d supply, fill #0

## 2022-12-06 NOTE — TOC Transition Note (Signed)
Transition of Care [facility]) - CM/SW Discharge Note   Patient Details  Name: Lorraine Turner MRN: [id number] Date of Birth: Dec 01, 1960  [facility] of [facility]) CM/SW Contact:  Leone Haven, RN Phone Number: 12/06/2022, 4:23 PM   Clinical Narrative:    For dc today, she has transport home.  Has no needs.      Barriers to Discharge: Continued Medical Work up   Patient Goals and CMS Choice   Choice offered to / list presented to : NA  Discharge Placement                         Discharge Plan and Services Additional resources added to the After Visit Summary for   In-house Referral: NA Discharge Planning Services: CM Consult Post Acute Care Choice: NA          DME Arranged: N/A DME Agency: NA       HH Arranged: NA          Social Determinants of Health (SDOH) Interventions SDOH Screenings   Food Insecurity: Food Insecurity Present (12/03/2022)  Housing: Low Risk  (12/03/2022)  Transportation Needs: No Transportation Needs (12/03/2022)  Utilities: Not At Risk (12/03/2022)  Alcohol Screen: Low Risk  (11/17/2020)  Depression (PHQ2-9): Medium Risk (04/03/2022)  Financial Resource Strain: High Risk (02/26/2022)  Tobacco Use: Medium Risk (12/03/2022)     Readmission Risk Interventions    12/04/2022    4:07 PM 02/12/2022    2:59 PM 11/26/2020   11:18 AM  Readmission Risk Prevention Plan  Transportation Screening Complete Complete Complete  PCP or Specialist Appt within 3-5 Days Complete  Complete  HRI or Home Care Consult Complete -- Complete  Social Work Consult for Recovery Care Planning/Counseling  Complete Complete  Palliative Care Screening Not Applicable Not Applicable Not Applicable  Medication Review Oceanographer) Complete Complete Complete

## 2022-12-06 NOTE — Plan of Care (Signed)
  Problem: Education: Goal: Knowledge of General Education information will improve Description: Including pain rating scale, medication(s)/side effects and non-pharmacologic comfort measures Outcome: Progressing   Problem: Health Behavior/Discharge Planning: Goal: Ability to manage health-related needs will improve Outcome: Progressing   Problem: Clinical Measurements: Goal: Ability to maintain clinical measurements within normal limits will improve Outcome: Progressing   Problem: Clinical Measurements: Goal: Diagnostic test results will improve Outcome: Progressing   Problem: Clinical Measurements: Goal: Respiratory complications will improve Outcome: Progressing   
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
## 2022-12-06 NOTE — Progress Notes (Signed)
[facility] Progress Note   62 y.o. female schizoaffective disorder, HTN, HLD, depression, anxiety with h/o homelessness + CKD 5. Initially seen in 01/2022 in the hospital with Cr in the 9's with CKD thought to be progression in the setting of poorly controlled HTN. Patient last seen at [facility] clinic on 08/20/2022 with SBP noted to be 210 with noncompliance running  out of her medications. Creatinine was 5.4 at time of clinic visit. Kidney u/s 02/09/22 showed decreased volumes but no hydronephrosis. Weight at [facility] clinic visit on 08/20/22 was 126.6 lbs. P/W SOB having been off all her meds for several months secondary to being depressed. Decreased exercise tolerance as well with nonproductive cough, orthopnea. She denies fever, chills, nausea, chest pain, weakness, decreased UOP or dysuria.    BP in the ED was 235/83 HB 10.2 K 4.1 BUN/Cr 62/5.1 BNP 1830. Started on a nitroglycerin drip and given Lasix 40mg .    Assessment/ Plan:   CKD5 secondary to HTN followed by [facility] last seen 08/20/22 and [facility] with medications off for many months secondary to depression.  - She's feeling much better and understands she needs to take her medications. She actually asked what she can do to delay having to start dialysis. I educated her and most important thing she can do is to take her medications. - No indication to initiate dialysis but when needed she has a functional LUA AVG. - Would place her on Lasix 80/40 upon d/c and we can schedule a 2 week f/u for her. Office already notified and will call her; I also confirmed her cell and let the office know. [phone])  -> Appt [facility] 11/8 (will need to be seen q4-6 weeks subsequently given advanced CKD5); left arm access should be good to go whenever she gets uremic.  Clinically she's much better and ambulating in the hallway.    -Monitor Daily I/Os, Daily weight  -Maintain MAP>65 for optimal renal perfusion.  - Avoid nephrotoxic agents such as IV contrast,  NSAIDs, and phosphate containing bowel preps (FLEETS)   Hypertensive emergency - better controlled; goal is 30% drop in the 1st 24 hrs and she is already there. She just needs to take her medications. Renal osteodystrophy - will check a iPTH; phos 4.2. DM - diet controlled Anemia - TSAT 12% F162. Will load w/ IV Fe (order written). HFpEF Schizoaffective Social - currently living with her daughter.  Subjective:   Feeling much better; ambulating hallway without SOB>   Objective:   BP (!) 158/59 (BP Location: Right Arm)   Pulse 72   Temp 98.1 F (36.7 C) (Oral)   Resp 19   Ht 5\' 2"  (1.575 m)   Wt 58.3 kg   SpO2 95%   BMI 23.51 kg/m   Intake/Output Summary (Last 24 hours) at 12/06/2022 1608 Last data filed at 12/05/2022 2130 Gross per 24 hour  Intake 360 ml  Output --  Net 360 ml   Weight change: 1.5 kg  Physical Exam: GEN: NAD, A&Ox3, NCAT HEENT: No conjunctival pallor, EOMI NECK: Supple, no thyromegaly LUNGS: CTA B/L no rales, rhonchi or wheezing CV: RRR, No M/R/G ABD: SNDNT +BS  EXT: No lower extremity edema Access: LUA AVG good thrill   Imaging: ECHOCARDIOGRAM COMPLETE  Result Date: 12/05/2022    ECHOCARDIOGRAM REPORT   Patient Name:   JAYANNA EVERY Date of Exam: 12/05/2022 Medical Rec #:  [id number]         Height:       62.0 in  Accession #:    [id number]        Weight:       125.2 lb Date of Birth:  1960-10-24         BSA:          1.567 m Patient Age:    61 years          BP:           153/58 mmHg Patient Gender: F                 HR:           70 bpm. Exam Location:  Inpatient Procedure: 2D Echo, Cardiac Doppler, Color Doppler and Strain Analysis Indications:    CHF-Acute Diastolic  History:        Patient has no prior history of Echocardiogram examinations and                 Patient has prior history of Echocardiogram examinations, most                 recent 02/07/2022. Risk Factors:Hypertension and Diabetes.  Sonographer:    Karma Ganja Referring Phys:  [phone] RONDELL A SMITH  Sonographer Comments: Global longitudinal strain was attempted. IMPRESSIONS  1. Left ventricular ejection fraction, by estimation, is 55 to 60%. The left ventricle has normal function. The left ventricle has no regional wall motion abnormalities. There is mild concentric left ventricular hypertrophy. Left ventricular diastolic parameters are consistent with Grade II diastolic dysfunction (pseudonormalization). The average left ventricular global longitudinal strain is -20.5 %. The global longitudinal strain is normal.  2. Right ventricular systolic function is normal. The right ventricular size is normal. There is severely elevated pulmonary artery systolic pressure. The estimated right ventricular systolic pressure is 78.7 mmHg.  3. Left atrial size was mildly dilated.  4. The mitral valve is normal in structure. Trivial mitral valve regurgitation. No evidence of mitral stenosis.  5. Tricuspid valve regurgitation is mild to moderate.  6. The aortic valve is tricuspid. Aortic valve regurgitation is not visualized. No aortic stenosis is present.  7. The inferior vena cava is dilated in size with <50% respiratory variability, suggesting right atrial pressure of 15 mmHg. FINDINGS  Left Ventricle: Left ventricular ejection fraction, by estimation, is 55 to 60%. The left ventricle has normal function. The left ventricle has no regional wall motion abnormalities. The average left ventricular global longitudinal strain is -20.5 %. The global longitudinal strain is normal. The left ventricular internal cavity size was normal in size. There is mild concentric left ventricular hypertrophy. Left ventricular diastolic parameters are consistent with Grade II diastolic dysfunction (pseudonormalization). Right Ventricle: The right ventricular size is normal. No increase in right ventricular wall thickness. Right ventricular systolic function is normal. There is severely elevated pulmonary artery systolic  pressure. The tricuspid regurgitant velocity is 3.99 m/s, and with an assumed right atrial pressure of 15 mmHg, the estimated right ventricular systolic pressure is 78.7 mmHg. Left Atrium: Left atrial size was mildly dilated. Right Atrium: Right atrial size was normal in size. Pericardium: There is no evidence of pericardial effusion. Mitral Valve: The mitral valve is normal in structure. Trivial mitral valve regurgitation. No evidence of mitral valve stenosis. Tricuspid Valve: The tricuspid valve is normal in structure. Tricuspid valve regurgitation is mild to moderate. Aortic Valve: The aortic valve is tricuspid. Aortic valve regurgitation is not visualized. No aortic stenosis is present. Aortic valve mean gradient measures 6.0 mmHg. Aortic valve peak  gradient measures 10.5 mmHg. Aortic valve area, by VTI measures 1.43  cm. Pulmonic Valve: The pulmonic valve was normal in structure. Pulmonic valve regurgitation is trivial. Aorta: The aortic root is normal in size and structure. Venous: The inferior vena cava is dilated in size with less than 50% respiratory variability, suggesting right atrial pressure of 15 mmHg. IAS/Shunts: No atrial level shunt detected by color flow Doppler.  LEFT VENTRICLE PLAX 2D LVIDd:         4.60 cm   Diastology LVIDs:         3.00 cm   LV e' medial:    5.87 cm/s LV PW:         1.10 cm   LV E/e' medial:  14.5 LV IVS:        1.20 cm   LV e' lateral:   7.83 cm/s LVOT diam:     1.70 cm   LV E/e' lateral: 10.9 LV SV:         52 LV SV Index:   33        2D Longitudinal Strain LVOT Area:     2.27 cm  2D Strain GLS Avg:     -20.5 %  RIGHT VENTRICLE             IVC RV Basal diam:  4.00 cm     IVC diam: 2.90 cm RV S prime:     12.90 cm/s TAPSE (M-mode): 2.3 cm LEFT ATRIUM            Index        RIGHT ATRIUM           Index LA diam:      4.50 cm  2.87 cm/m   RA Area:     16.70 cm LA Vol (A2C): 115.0 ml 73.40 ml/m  RA Volume:   47.90 ml  30.57 ml/m LA Vol (A4C): 54.8 ml  34.98 ml/m  AORTIC  VALVE                     PULMONIC VALVE AV Area (Vmax):    1.42 cm      PV Vmax:       1.00 m/s AV Area (Vmean):   1.40 cm      PV Vmean:      70.600 cm/s AV Area (VTI):     1.43 cm      PV VTI:        0.230 m AV Vmax:           162.00 cm/s   PV Peak grad:  4.0 mmHg AV Vmean:          109.000 cm/s  PV Mean grad:  2.0 mmHg AV VTI:            0.363 m AV Peak Grad:      10.5 mmHg AV Mean Grad:      6.0 mmHg LVOT Vmax:         101.00 cm/s LVOT Vmean:        67.100 cm/s LVOT VTI:          0.228 m LVOT/AV VTI ratio: 0.63  AORTA Ao Root diam: 2.70 cm Ao Asc diam:  2.70 cm MITRAL VALVE               TRICUSPID VALVE MV Area (PHT): 4.21 cm    TR Peak grad:   63.7 mmHg MV Decel Time: 180 msec    TR Vmax:  399.00 cm/s MV E velocity: 85.40 cm/s MV A velocity: 60.30 cm/s  SHUNTS MV E/A ratio:  1.42        Systemic VTI:  0.23 m                            Systemic Diam: 1.70 cm Dalton McleanMD Electronically signed by Wilfred Lacy Signature Date/Time: 12/05/2022/10:17:37 AM    Final     Labs: BMET Recent Labs  Lab 12/03/22 1223 12/04/22 0309 12/04/22 1852 12/05/22 0342 12/06/22 0345  NA 140 139  --  137 137  K 4.1 3.9  --  4.0 4.5  CL 106 106  --  103 104  CO2 23 24  --  24 24  GLUCOSE 152* 167*  --  129* 107*  BUN 62* 66*  --  70* 73*  CREATININE 5.10* 5.68*  --  6.21* 6.66*  CALCIUM 9.0 8.5*  --  8.7* 8.6*  PHOS  --  3.8 4.3 4.2 4.7*   CBC Recent Labs  Lab 12/03/22 1223 12/04/22 0309 12/05/22 0342  WBC 5.3 5.9 5.2  HGB 10.2* 9.3* 9.0*  HCT 31.7* 28.4* 27.8*  MCV 92.4 90.2 91.7  PLT 190 185 175    Medications:     amLODipine  10 mg Oral Daily   feeding supplement  237 mL Oral BID BM   heparin  5,000 Units Subcutaneous Q8H   hydrALAZINE  25 mg Oral TID   multivitamin with minerals  1 tablet Oral Daily   sodium chloride flush  3 mL Intravenous Q12H      Paulene Floor, MD 12/06/2022, 4:08 PM

## 2022-12-06 NOTE — Telephone Encounter (Signed)
Pharmacy Patient Advocate Encounter   Received notification from  Inpatient  that prior authorization for QUEtiapine Fumarate 50MG  tablets is required/requested.   Insurance verification completed.   The patient is insured through [employer] [place] .   Per test claim: PA required; PA submitted to [facility] via CoverMyMeds Key/confirmation #/EOC [id number] Status is pending

## 2022-12-06 NOTE — Plan of Care (Signed)

## 2022-12-09 ENCOUNTER — Telehealth: Payer: Self-pay

## 2022-12-09 ENCOUNTER — Other Ambulatory Visit (HOSPITAL_COMMUNITY): Payer: Self-pay

## 2022-12-09 NOTE — Transitions of Care (Post Inpatient/ED Visit) (Signed)
12/09/2022  Name: Lorraine Turner MRN: [id number] DOB: 04-06-1960  Today's TOC FU Call Status: Today's TOC FU Call Status:: Successful TOC FU Call Completed TOC FU Call Complete Date: 12/09/22 Patient's Name and Date of Birth confirmed.  Transition Care Management Follow-up Telephone Call Date of Discharge: 12/06/22 Discharge Facility: [facility]) Type of Discharge: Inpatient Admission Primary Inpatient Discharge Diagnosis:: diastolic CHF How have you been since you were released from the hospital?: Better Any questions or concerns?: Yes Patient Questions/Concerns:: She said she is feeling depressed as she is trying to deal with the changes in her medical condition and up coming need for dialysis.  She denied any suicidal ideation and would like to speak with our SW. Patient Questions/Concerns Addressed: Other: (referral sent to Reginia Naas, LCSWA requesting she contact the patient .)  Items Reviewed: Did you receive and understand the discharge instructions provided?: Yes Medications obtained,verified, and reconciled?: Yes (Medications Reviewed) (She did not have any questions about her med regime,) Any new allergies since your discharge?: No Dietary orders reviewed?: Yes Type of Diet Ordered:: low sodium, diabetic.  She said she is struggling to adhere to this diet. Do you have support at home?: Yes People in Home: child(ren), adult Name of Support/Comfort Primary Source: her youngest daughter  Medications Reviewed Today: Medications Reviewed Today     Reviewed by Robyne Peers, RN (Case Manager) on 12/09/22 at 1455  Med List Status: <None>   Medication Order Taking? Sig Documenting Provider Last Dose Status Informant  amLODipine (NORVASC) 10 MG tablet [id number]  Take 1 tablet (10 mg total) by mouth daily. Zannie Cove, MD  Active   fluticasone [facility]) 50 MCG/ACT nasal spray [id number] No Place 1 spray into both nostrils daily. Steffanie Rainwater, MD Unk Expired  12/03/22 2359 Self, Pharmacy Records           Med Note Yevette Edwards   Tue Dec 03, 2022  4:09 PM) Pt in need of a refill of this medication.  furosemide (LASIX) 40 MG tablet [id number]  Take 2 tablets (80mg ) in am and 1 tablet (40mg ) in afternoon Zannie Cove, MD  Active   hydrALAZINE (APRESOLINE) 25 MG tablet [id number]  Take 1 tablet (25 mg total) by mouth 3 (three) times daily. Zannie Cove, MD  Active   ondansetron [facility]) 4 MG tablet [id number]  Take 1 tablet (4 mg total) by mouth every 8 (eight) hours as needed for nausea or vomiting. Zannie Cove, MD  Active   QUEtiapine (SEROQUEL) 50 MG tablet [id number]  Take 1 tablet (50 mg total) by mouth at bedtime. Zannie Cove, MD  Active             Home Care and Equipment/Supplies: Were Home Health Services Ordered?: No Any new equipment or medical supplies ordered?: No  Functional Questionnaire: Do you need assistance with bathing/showering or dressing?: No Do you need assistance with meal preparation?: No Do you need assistance with eating?: No Do you have difficulty maintaining continence: No Do you need assistance with getting out of bed/getting out of a chair/moving?: No Do you have difficulty managing or taking your medications?: No  Follow up appointments reviewed: PCP Follow-up appointment confirmed?: Yes Date of PCP follow-up appointment?: 12/19/22 Follow-up Provider: Robyne Peers Specialist [facility] Follow-up appointment confirmed?: Yes Date of Specialist follow-up appointment?: 02/03/23 Follow-Up Specialty Provider:: nutritionist.  She needs to schedule her follow up with nephrology. Do you need transportation to your follow-up appointment?: No Do you understand care options if your  condition(s) worsen?: Yes-patient verbalized understanding    SIGNATURE Robyne Peers, RN

## 2022-12-09 NOTE — Telephone Encounter (Signed)
Pharmacy Patient Advocate Encounter  Received notification from [facility] that Prior Authorization for QUEtiapine Fumarate 50MG  tablets  has been APPROVED from 12/06/2022 to 12/06/2023   PA #/Case ID/Reference #: [id number]

## 2022-12-09 NOTE — Telephone Encounter (Signed)
Toni Amend, can you please call this patient and follow up with her regarding her depression.  Thanks

## 2022-12-11 ENCOUNTER — Telehealth: Payer: Self-pay | Admitting: Licensed Clinical Social Worker

## 2022-12-11 NOTE — Telephone Encounter (Signed)
Called pt to follow up with her regarding her depression. Called pt 3times, pt did not answer. I was able to leave the pt a voicemail and will call her again tomorrow.

## 2022-12-11 NOTE — Telephone Encounter (Signed)
Of course!

## 2022-12-13 ENCOUNTER — Telehealth: Payer: Self-pay | Admitting: Licensed Clinical Social Worker

## 2022-12-13 NOTE — Telephone Encounter (Signed)
LCSWA called patient today to introduce herself and to assess patients' mental health needs. Patient did not answer the phone. LCSWA was able to leave a brief message with the patient asking them to return the call. Patient was referred by PCP for depression.  

## 2022-12-19 ENCOUNTER — Telehealth: Payer: Self-pay

## 2022-12-19 ENCOUNTER — Ambulatory Visit: Payer: MEDICAID | Attending: Physician Assistant | Admitting: Physician Assistant

## 2022-12-19 ENCOUNTER — Encounter: Payer: Self-pay | Admitting: Physician Assistant

## 2022-12-19 VITALS — BP 171/63 | HR 76 | Wt 128.6 lb

## 2022-12-19 DIAGNOSIS — T7431XA Adult psychological abuse, confirmed, initial encounter: Secondary | ICD-10-CM

## 2022-12-19 DIAGNOSIS — I10 Essential (primary) hypertension: Secondary | ICD-10-CM | POA: Diagnosis not present

## 2022-12-19 DIAGNOSIS — Z1331 Encounter for screening for depression: Secondary | ICD-10-CM

## 2022-12-19 DIAGNOSIS — F3289 Other specified depressive episodes: Secondary | ICD-10-CM | POA: Diagnosis not present

## 2022-12-19 DIAGNOSIS — E1165 Type 2 diabetes mellitus with hyperglycemia: Secondary | ICD-10-CM

## 2022-12-19 LAB — GLUCOSE, POCT (MANUAL RESULT ENTRY): POC Glucose: 152 mg/dL — AB (ref 70–99)

## 2022-12-19 MED ORDER — QUETIAPINE FUMARATE 50 MG PO TABS: 50.0000 mg | ORAL_TABLET | Freq: Every day | ORAL | 3 refills | Status: DC

## 2022-12-19 MED ORDER — HYDRALAZINE HCL 25 MG PO TABS: 25.0000 mg | ORAL_TABLET | Freq: Three times a day (TID) | ORAL | 0 refills | Status: DC

## 2022-12-19 MED ORDER — AMLODIPINE BESYLATE 10 MG PO TABS
10.0000 mg | ORAL_TABLET | Freq: Every day | ORAL | 1 refills | Status: DC
Start: 2022-12-19 — End: 2023-05-20

## 2022-12-19 MED ORDER — FUROSEMIDE 40 MG PO TABS: 40.0000 mg | ORAL_TABLET | Freq: Two times a day (BID) | ORAL | 0 refills | Status: DC
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
## 2022-12-25 ENCOUNTER — Telehealth: Payer: Self-pay | Admitting: Licensed Clinical Social Worker

## 2022-12-25 NOTE — Telephone Encounter (Signed)
Thank you we called daughter today, and her voicemail didn't work. Will continue to follow up. Just an [place]

## 2022-12-25 NOTE — Telephone Encounter (Signed)
noted 

## 2022-12-25 NOTE — Telephone Encounter (Signed)
Called pt and her daughter to reach the pt. No one answered. One voicemail full the other not working. Will follow up.

## 2022-12-25 NOTE — Telephone Encounter (Signed)
Lorraine Turner I've called her about 5 times. Is there another number. The last time we called I couldn't leave a voicemail. I would love to speak with her.

## 2023-01-02 NOTE — Telephone Encounter (Signed)
I was able to reach the patient today. She said she is doing well. She has scheduled an appointment with a behavioral clinician for med management 02/23/2022 and an appointment for therapy on 03/16/2022.  She feels very good about having scheduled those appointments. No concerns reported at this time. I told her that Reginia Naas, LCSWA has been trying to reach her but her voicemail is full. She said she will try to clear the voicemails this afternoon.

## 2023-01-08 ENCOUNTER — Telehealth: Payer: Self-pay | Admitting: Licensed Clinical Social Worker

## 2023-01-08 NOTE — Telephone Encounter (Signed)
Called patient to follow up for her wellbeing patient did not answer and voicemail was full.

## 2023-02-03 ENCOUNTER — Ambulatory Visit: Payer: MEDICAID | Admitting: Dietician

## 2023-02-20 ENCOUNTER — Ambulatory Visit (HOSPITAL_COMMUNITY): Payer: MEDICAID | Admitting: Student

## 2023-02-20 LAB — LAB REPORT - SCANNED: EGFR: 7

## 2023-02-21 NOTE — Progress Notes (Signed)
 Patient did not connect for virtual psychiatric medication management appointment on 02/24/23 at 1PM. Sent secure video link with no response. Called phone and patient answered stating she does not have video capability and would not be able to conduct appointment. She requests reschedule for next available in-person appointment with this provider (offered earlier appt with alternative provider however patient declined). Rescheduled for 04/15/23 at 1PM. Denies any urgent concerns or needs at this time.  Patient was also reminded of therapy appointment scheduled 2/3 at 10AM in person. Provided with clinic location and number.  LAURAINE DELENA PUMMEL, MD 02/24/23

## 2023-02-24 ENCOUNTER — Encounter (HOSPITAL_COMMUNITY): Payer: Self-pay

## 2023-02-24 ENCOUNTER — Encounter (HOSPITAL_COMMUNITY): Payer: MEDICAID | Admitting: Psychiatry

## 2023-03-17 ENCOUNTER — Ambulatory Visit (HOSPITAL_COMMUNITY): Payer: MEDICAID | Admitting: Clinical

## 2023-03-18 ENCOUNTER — Emergency Department (HOSPITAL_COMMUNITY): Payer: MEDICAID

## 2023-03-18 ENCOUNTER — Emergency Department (HOSPITAL_COMMUNITY)
Admission: EM | Admit: 2023-03-18 | Discharge: 2023-03-19 | Disposition: A | Discharge status: Home / Self Care | Payer: MEDICAID | Attending: Emergency Medicine | Admitting: Emergency Medicine

## 2023-03-18 DIAGNOSIS — Z20822 Contact with and (suspected) exposure to covid-19: Secondary | ICD-10-CM | POA: Diagnosis not present

## 2023-03-18 DIAGNOSIS — E1122 Type 2 diabetes mellitus with diabetic chronic kidney disease: Secondary | ICD-10-CM | POA: Diagnosis not present

## 2023-03-18 DIAGNOSIS — R059 Cough, unspecified: Secondary | ICD-10-CM | POA: Diagnosis present

## 2023-03-18 DIAGNOSIS — J101 Influenza due to other identified influenza virus with other respiratory manifestations: Secondary | ICD-10-CM | POA: Insufficient documentation

## 2023-03-18 DIAGNOSIS — I129 Hypertensive chronic kidney disease with stage 1 through stage 4 chronic kidney disease, or unspecified chronic kidney disease: Secondary | ICD-10-CM | POA: Insufficient documentation

## 2023-03-18 DIAGNOSIS — Z79899 Other long term (current) drug therapy: Secondary | ICD-10-CM | POA: Diagnosis not present

## 2023-03-18 DIAGNOSIS — N189 Chronic kidney disease, unspecified: Secondary | ICD-10-CM | POA: Diagnosis not present

## 2023-03-18 LAB — CBC WITH DIFFERENTIAL/PLATELET
Abs Immature Granulocytes: 0.02 10*3/uL (ref 0.00–0.07)
Basophils Absolute: 0 10*3/uL (ref 0.0–0.1)
Basophils Relative: 1 %
Eosinophils Absolute: 0 10*3/uL (ref 0.0–0.5)
Eosinophils Relative: 0 %
HCT: 30.9 % — ABNORMAL LOW (ref 36.0–46.0)
Hemoglobin: 9.9 g/dL — ABNORMAL LOW (ref 12.0–15.0)
Immature Granulocytes: 1 %
Lymphocytes Relative: 12 %
Lymphs Abs: 0.4 10*3/uL — ABNORMAL LOW (ref 0.7–4.0)
MCH: 29.8 pg (ref 26.0–34.0)
MCHC: 32 g/dL (ref 30.0–36.0)
MCV: 93.1 fL (ref 80.0–100.0)
Monocytes Absolute: 0.7 10*3/uL (ref 0.1–1.0)
Monocytes Relative: 23 %
Neutro Abs: 2 10*3/uL (ref 1.7–7.7)
Neutrophils Relative %: 63 %
Platelets: 154 10*3/uL (ref 150–400)
RBC: 3.32 MIL/uL — ABNORMAL LOW (ref 3.87–5.11)
RDW: 13.8 % (ref 11.5–15.5)
WBC: 3.1 10*3/uL — ABNORMAL LOW (ref 4.0–10.5)
nRBC: 0 % (ref 0.0–0.2)

## 2023-03-18 LAB — COMPREHENSIVE METABOLIC PANEL
ALT: 33 U/L (ref 0–44)
AST: 27 U/L (ref 15–41)
Albumin: 3.6 g/dL (ref 3.5–5.0)
Alkaline Phosphatase: 59 U/L (ref 38–126)
Anion gap: 14 (ref 5–15)
BUN: 62 mg/dL — ABNORMAL HIGH (ref 8–23)
CO2: 17 mmol/L — ABNORMAL LOW (ref 22–32)
Calcium: 8.6 mg/dL — ABNORMAL LOW (ref 8.9–10.3)
Chloride: 105 mmol/L (ref 98–111)
Creatinine, Ser: 6.54 mg/dL — ABNORMAL HIGH (ref 0.44–1.00)
GFR, Estimated: 7 mL/min — ABNORMAL LOW (ref >60)
Glucose, Bld: 90 mg/dL (ref 70–99)
Potassium: 4.5 mmol/L (ref 3.5–5.1)
Sodium: 136 mmol/L (ref 135–145)
Total Bilirubin: 0.7 mg/dL (ref 0.0–1.2)
Total Protein: 6.9 g/dL (ref 6.5–8.1)

## 2023-03-18 LAB — RESP PANEL BY RT-PCR (RSV, FLU A&B, COVID)  RVPGX2
Influenza A by PCR: POSITIVE — AB
Influenza B by PCR: NEGATIVE
Resp Syncytial Virus by PCR: NEGATIVE
SARS Coronavirus 2 by RT PCR: NEGATIVE

## 2023-03-18 NOTE — ED Provider Triage Note (Signed)
 Emergency Medicine Provider Triage Evaluation Note  Lorraine Turner , a 63 y.o. female  was evaluated in triage.  Pt complains of cough, shortness of breath.  Patient states she has had several days of cough, subjective fever, some shortness of breath.  Multiple sick contacts at home with family.  She couple days ago had some nausea and 1 episode of emesis and diarrhea nonbloody.  Nausea, diarrhea have resolved.  She has a graft in place for dialysis which she has not started yet.  She has been peeing slightly more than normal.  No dysuria.  Subjective fevers.  No chest pain.  No headache or neurologic changes.  Review of Systems  Positive: Cough, shortness of breath Negative: Chest pain, neurologic changes, headache  Physical Exam  BP (!) 165/57   Pulse 69   Temp (!) 100.4 F (38 C) (Oral)   Resp 18   SpO2 96%  Gen:   Awake, no distress   Resp:  Normal effort  MSK:   Moves extremities without difficulty  Other:  Benign abdominal exam  Medical Decision Making  Medically screening exam initiated at 1:27 PM.  Appropriate orders placed.  Lorraine Turner was informed that the remainder of the evaluation will be completed by another provider, this initial triage assessment does not replace that evaluation, and the importance of remaining in the ED until their evaluation is complete.  Patient here with generalized weakness, subjective fever, cough.  Here she has slight fever but did not appear to be septic.  I wonder if she may have viral syndrome.  She has a vomiting diarrhea although this has improved.   Nicholaus Lorraine DEL, MD 03/18/23 1328

## 2023-03-18 NOTE — ED Triage Notes (Signed)
Pt presents via EMS from home for weakness since Sunday, family sick with same.  Also endorses cough, emesis, nausea, subjective fever, diarrhea  Will be starting HD soon, has a graft still maturing in L arm  EMS VS: 98.50F, 168/70, 109CBG

## 2023-03-19 NOTE — Discharge Instructions (Addendum)
 Continue your daily prescribed medications and follow-up with your doctor in 2 to 3 days for reassessment.  If you develop persistent body aches, fever, you may take 650 mg Tylenol  every 4-6 hours for management.  Do not take ibuprofen  or Aleve due to your underlying kidney disease.  You may return to the ED for new or concerning symptoms.

## 2023-03-19 NOTE — ED Provider Notes (Signed)
 [place] EMERGENCY DEPARTMENT AT [facility] Provider Note   CSN: [id number] Arrival date & time: 03/18/23  1242     History  Chief Complaint  Patient presents with   Weakness    Lorraine Turner is a 63 y.o. female.  63 year old female with a history of hypertension, diabetes, hyperlipidemia, CKD, bipolar 1 disorder, schizophrenia presents to the emergency department for symptoms consistent with viral syndrome.  She states that she lost her sense of taste on Saturday and developed worsening fatigue on Sunday and into Monday.  Symptoms associated with cough as well as subjective fever, anorexia.  A few days ago she did have nausea with 1 episode of vomiting and diarrhea, but this has resolved.  She has been tolerating food and fluids today without issue.  Currently without CP, SOB, headache, urinary symptoms.  States that multiple other family members at home are sick with similar symptoms.  The history is provided by the patient. No language interpreter was used.  Weakness      Home Medications Prior to Admission medications   Medication Sig Start Date End Date Taking? Authorizing Provider  amLODipine  (NORVASC ) 10 MG tablet Take 1 tablet (10 mg total) by mouth daily. 12/19/22 01/18/23  Danton Jon CHRISTELLA, PA-C  fluticasone  (FLONASE ) 50 MCG/ACT nasal spray Place 1 spray into both nostrils daily. 10/16/21 12/03/22  Lou Claretta CHRISTELLA, MD  furosemide  (LASIX ) 40 MG tablet Take 2 tablets (80mg ) in am and 1 tablet (40mg ) in afternoon 12/19/22   Danton Jon CHRISTELLA, PA-C  hydrALAZINE  (APRESOLINE ) 25 MG tablet Take 1 tablet (25 mg total) by mouth 3 (three) times daily. 12/19/22   Danton Jon CHRISTELLA, PA-C  ondansetron  (ZOFRAN ) 4 MG tablet Take 1 tablet (4 mg total) by mouth every 8 (eight) hours as needed for nausea or vomiting. 12/06/22   Fairy Frames, MD  QUEtiapine  (SEROQUEL ) 50 MG tablet Take 1 tablet (50 mg total) by mouth at bedtime. 12/19/22   Danton Jon CHRISTELLA, PA-C       Allergies    Lisinopril  and Amoxicillin    Review of Systems   Review of Systems  Neurological:  Positive for weakness.  Ten systems reviewed and are negative for acute change, except as noted in the HPI.    Physical Exam Updated Vital Signs BP (!) 177/64   Pulse 70   Temp 99.2 F (37.3 C)   Resp 16   SpO2 98%   Physical Exam Vitals and nursing note reviewed.  Constitutional:      General: She is not in acute distress.    Appearance: She is well-developed. She is not diaphoretic.     Comments: Nontoxic appearing and in NAD  HENT:     Head: Normocephalic and atraumatic.  Eyes:     General: No scleral icterus.    Conjunctiva/sclera: Conjunctivae normal.  Cardiovascular:     Rate and Rhythm: Normal rate and regular rhythm.     Pulses: Normal pulses.  Pulmonary:     Effort: Pulmonary effort is normal. No respiratory distress.     Breath sounds: No wheezing.     Comments: Respirations even and unlabored. Moving air fairly well. Lungs CTAB. Musculoskeletal:        General: Normal range of motion.     Cervical back: Normal range of motion.     Comments: Trace pitting edema BLE  Skin:    General: Skin is warm and dry.     Coloration: Skin is not pale.  Findings: No erythema or rash.  Neurological:     Mental Status: She is alert and oriented to person, place, and time.     Coordination: Coordination normal.  Psychiatric:        Behavior: Behavior normal.     ED Results / Procedures / Treatments   Labs (all labs ordered are listed, but only abnormal results are displayed) Labs Reviewed  RESP PANEL BY RT-PCR (RSV, FLU A&B, COVID)  RVPGX2 - Abnormal; Notable for the following components:      Result Value   Influenza A by PCR POSITIVE (*)    All other components within normal limits  COMPREHENSIVE METABOLIC PANEL - Abnormal; Notable for the following components:   CO2 17 (*)    BUN 62 (*)    Creatinine, Ser 6.54 (*)    Calcium  8.6 (*)    GFR, Estimated 7  (*)    All other components within normal limits  CBC WITH DIFFERENTIAL/PLATELET - Abnormal; Notable for the following components:   WBC 3.1 (*)    RBC 3.32 (*)    Hemoglobin 9.9 (*)    HCT 30.9 (*)    Lymphs Abs 0.4 (*)    All other components within normal limits  URINALYSIS, ROUTINE W REFLEX MICROSCOPIC    EKG EKG Interpretation Date/Time:  Tuesday March 18 2023 13:42:48 EST Ventricular Rate:  67 PR Interval:  114 QRS Duration:  82 QT Interval:  454 QTC Calculation: 479 R Axis:   83  Text Interpretation: Normal sinus rhythm Minimal voltage criteria for LVH, may be normal variant ( Sokolow-Lyon ) Nonspecific ST abnormality Abnormal ECG When compared with ECG of 03-Dec-2022 11:58, No significant change was found Confirmed by Raford Lenis ([id number]) on 03/18/2023 11:39:13 PM  Radiology DG Chest 2 View Result Date: 03/18/2023 CLINICAL DATA:  Cough and fever EXAM: CHEST - 2 VIEW COMPARISON:  12/03/2022 FINDINGS: Stable cardiomediastinal silhouette. Aortic atherosclerotic calcification. Pulmonary vascular congestion. Similar right basilar airspace opacities compared to 12/03/2022. Small right pleural effusion and trace left pleural effusion. No pneumothorax. IMPRESSION: 1. Cardiomegaly and pulmonary vascular congestion. 2. Small right and trace left pleural effusion and right basilar airspace opacities similar to prior. Electronically Signed   By: Norman Gatlin M.D.   On: 03/18/2023 15:20    Procedures Procedures    Medications Ordered in ED Medications - No data to display  ED Course/ Medical Decision Making/ A&P                                 Medical Decision Making  This patient presents to the ED for concern of flu-like symptoms, this involves an extensive number of treatment options, and is a complaint that carries with it a high risk of complications and morbidity.  The differential diagnosis includes viral illness vs PNA vs electrolyte derangement vs uremia   Co  morbidities that complicate the patient evaluation  HTN DM HLD CKD   Additional history obtained:  Additional history obtained from EMS personnel External records from outside source obtained and reviewed including prior labs for baseline comparison.   Lab Tests:  I Ordered, and personally interpreted labs.  The pertinent results include:  Flu A positive. WBC 3.1 (c/w viral process). Hgb 9.9 (stable compared to baseline; anemia of chronic dx, CKD). Creatinine 6.54 (stable from 3 months ago).   Imaging Studies ordered:  I ordered imaging studies including CXR  I independently visualized and interpreted  imaging which showed stable cardiomegaly with some pulmonary vascular congestion, trace pleural effusions. I agree with the radiologist interpretation   Cardiac Monitoring:  The patient was maintained on a cardiac monitor.  I personally viewed and interpreted the cardiac monitored which showed an underlying rhythm of: NSR   Medicines ordered and prescription drug management:  I have reviewed the patients home medicines and have made adjustments as needed   Test Considered:  BNP   Problem List / ED Course:  63 year old female presenting for flulike symptoms which worsened on Sunday and have been persistent.  She is positive for influenza today.  Reports that other individuals in the home are sick as well.  Afebrile in the emergency department without tachypnea, dyspnea, hypoxia.  Her labs appear consistent with baseline. Does have some cardiomegaly on chest x-ray which appears stable.  Vascular congestion, but clinically does not appear fluid overloaded.  She has only trace pitting edema in bilateral lower extremities.  She was under arrest reports compliance with her Lasix .  Continues to make urine.  Creatinine chronically elevated; pending initiation of dialysis.  CKD suspected to be contributing to normal anion gap acidosis.  Patient without concern for uremia, no altered  mental status. Ultimately, patient reports that she is feeling better.  She is eating and drinking in the emergency department without issue.  Outside of the window for Tamiflu treatment.  I have encouraged the patient to follow closely with her primary care doctor.  She is agreeable with plan.  All questions answered.   Reevaluation:  After the interventions noted above, I reevaluated the patient and found that they have :improved   Social Determinants of Health:  Good social support; lives with family   Dispostion:  After consideration of the diagnostic results and the patients response to treatment, I feel that the patent would benefit from supportive care and close PCP f/u. Return precautions discussed and provided. Patient discharged in stable condition with no unaddressed concerns.          Final Clinical Impression(s) / ED Diagnoses Final diagnoses:  Influenza A    Rx / DC Orders ED Discharge Orders     None         Keith Sor, PA-C 03/19/23 [id number]    Bari Charmaine FALCON, MD 03/20/23 [phone]

## 2023-04-02 ENCOUNTER — Ambulatory Visit: Payer: MEDICAID | Attending: Family Medicine | Admitting: Family Medicine

## 2023-04-02 LAB — LAB REPORT - SCANNED: EGFR: 8

## 2023-04-03 ENCOUNTER — Ambulatory Visit: Payer: MEDICAID | Admitting: Dietician
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
## 2023-04-15 ENCOUNTER — Ambulatory Visit (HOSPITAL_COMMUNITY): Payer: MEDICAID | Admitting: Psychiatry

## 2023-05-01 LAB — LAB REPORT - SCANNED: EGFR: 7

## 2023-05-08 ENCOUNTER — Other Ambulatory Visit (HOSPITAL_COMMUNITY): Payer: Self-pay | Admitting: *Deleted

## 2023-05-12 ENCOUNTER — Ambulatory Visit (HOSPITAL_COMMUNITY)
Admission: RE | Admit: 2023-05-12 | Discharge: 2023-05-12 | Disposition: A | Discharge status: Home / Self Care | Payer: MEDICAID | Source: Ambulatory Visit | Attending: Internal Medicine | Admitting: Internal Medicine

## 2023-05-12 VITALS — BP 164/50 | HR 71 | Temp 97.0°F | Resp 18

## 2023-05-12 DIAGNOSIS — N185 Chronic kidney disease, stage 5: Secondary | ICD-10-CM | POA: Diagnosis present

## 2023-05-12 DIAGNOSIS — N183 Chronic kidney disease, stage 3 unspecified: Secondary | ICD-10-CM | POA: Diagnosis present

## 2023-05-12 DIAGNOSIS — D649 Anemia, unspecified: Secondary | ICD-10-CM | POA: Diagnosis present

## 2023-05-12 DIAGNOSIS — N184 Chronic kidney disease, stage 4 (severe): Secondary | ICD-10-CM | POA: Diagnosis present

## 2023-05-12 LAB — POCT HEMOGLOBIN-HEMACUE: Hemoglobin: 9.8 g/dL — ABNORMAL LOW (ref 12.0–15.0)

## 2023-05-12 MED ORDER — EPOETIN ALFA-EPBX 10000 UNIT/ML IJ SOLN
20000.0000 [IU] | INTRAMUSCULAR | Status: DC
  Administered 2023-05-12: 20000 [IU] via SUBCUTANEOUS

## 2023-05-12 MED ORDER — EPOETIN ALFA-EPBX 10000 UNIT/ML IJ SOLN
INTRAMUSCULAR | Status: AC
  Filled 2023-05-12: qty 2

## 2023-05-20 ENCOUNTER — Ambulatory Visit: Payer: MEDICAID | Attending: Family Medicine | Admitting: Family Medicine

## 2023-05-20 ENCOUNTER — Encounter: Payer: Self-pay | Admitting: Family Medicine

## 2023-05-20 ENCOUNTER — Other Ambulatory Visit: Payer: Self-pay

## 2023-05-20 VITALS — BP 188/72 | HR 76 | Ht 62.0 in | Wt 125.8 lb

## 2023-05-20 DIAGNOSIS — N185 Chronic kidney disease, stage 5: Secondary | ICD-10-CM | POA: Diagnosis not present

## 2023-05-20 DIAGNOSIS — G4700 Insomnia, unspecified: Secondary | ICD-10-CM

## 2023-05-20 DIAGNOSIS — E1122 Type 2 diabetes mellitus with diabetic chronic kidney disease: Secondary | ICD-10-CM

## 2023-05-20 DIAGNOSIS — F3289 Other specified depressive episodes: Secondary | ICD-10-CM

## 2023-05-20 DIAGNOSIS — K0889 Other specified disorders of teeth and supporting structures: Secondary | ICD-10-CM

## 2023-05-20 DIAGNOSIS — I10 Essential (primary) hypertension: Secondary | ICD-10-CM

## 2023-05-20 DIAGNOSIS — I120 Hypertensive chronic kidney disease with stage 5 chronic kidney disease or end stage renal disease: Secondary | ICD-10-CM

## 2023-05-20 DIAGNOSIS — E1165 Type 2 diabetes mellitus with hyperglycemia: Secondary | ICD-10-CM

## 2023-05-20 LAB — POCT GLYCOSYLATED HEMOGLOBIN (HGB A1C): HbA1c, POC (controlled diabetic range): 5.9 % (ref 0.0–7.0)

## 2023-05-20 MED ORDER — FUROSEMIDE 40 MG PO TABS
ORAL_TABLET | ORAL | 5 refills | Status: AC
  Filled 2023-05-20: qty 90, fill #0

## 2023-05-20 MED ORDER — AMLODIPINE BESYLATE 10 MG PO TABS
10.0000 mg | ORAL_TABLET | Freq: Every day | ORAL | 1 refills | Status: AC
  Filled 2023-05-20: qty 90, 90d supply, fill #0

## 2023-05-20 MED ORDER — QUETIAPINE FUMARATE 50 MG PO TABS
50.0000 mg | ORAL_TABLET | Freq: Every day | ORAL | 1 refills | Status: AC
  Filled 2023-05-20: qty 90, 90d supply, fill #0

## 2023-05-20 MED ORDER — HYDRALAZINE HCL 50 MG PO TABS
50.0000 mg | ORAL_TABLET | Freq: Three times a day (TID) | ORAL | 1 refills | Status: AC
  Filled 2023-05-20: qty 270, 90d supply, fill #0

## 2023-05-20 NOTE — Progress Notes (Signed)
 Subjective:  Patient ID: Lorraine Turner, female    DOB: 1960-07-19  Age: 63 y.o. MRN: [id number]  CC: Medical Management of Chronic Issues (Discuss upcoming dental procedure/Discuss disability)     Discussed the use of AI scribe software for clinical note transcription with the patient, who gave verbal consent to proceed.  History of Present Illness The patient, with a history of  schizoaffective affective disorder, hypertension, diabetic neuropathy, type 2 diabetes mellitus, stage V CKD insomnia, presents with elevated blood pressure despite adherence to amlodipine, furosemide, and hydralazine. She reports taking her medications as prescribed, including the night before and the morning of the visit. She denies excessive salt intake and reports rinsing canned goods prior to consumption. She also reports mild right ankle swelling, which has improved with furosemide.  The patient also reports insomnia and racing thoughts, for which she takes Seroquel. She has not seen a psychiatrist or therapist in a long time, but has been managing her symptoms with the medication.  She also reports a dental issue requiring extraction of a tooth. However, the procedure has been postponed due to her elevated blood pressure. She plans to schedule the procedure for the following week.  Lastly, the patient expresses interest in applying for disability due to her chronic kidney disease.    Past Medical History:  Diagnosis Date   AKI (acute kidney injury) (HCC) 09/12/2018   Stage 4   Anxiety    Bipolar 1 disorder (HCC)    Depression    Diabetes mellitus without complication (HCC)    Diarrhea 11/21/2020   Gallstones    Hyperlipidemia    Hypertension    MDD (major depressive disorder), recurrent episode, moderate (HCC) 11/18/2020   Neuropathy    Schizophrenia (HCC)    Seizures (HCC)    X1- febrile seizure as a child-none since   Suicide ideation 09/12/2018    Past Surgical History:  Procedure  Laterality Date   AV FISTULA PLACEMENT Left 06/03/2022   Procedure: INSERTION OF ARTERIOVENOUS (AV) GORE-TEX GRAFT LEFT ARM;  Surgeon: Leonie Douglas, MD;  Location: [facility];  Service: Vascular;  Laterality: Left;   CESAREAN SECTION  05/1998   X 1   CHOLECYSTECTOMY N/A 03/27/2016   Procedure: LAPAROSCOPIC CHOLECYSTECTOMY;  Surgeon: Henrene Dodge, MD;  Location: [facility] ORS;  Service: General;  Laterality: N/A;   TONSILLECTOMY AND ADENOIDECTOMY     age 58   TUBAL LIGATION      Family History  Problem Relation Age of Onset   Diabetes Mother    Heart disease Mother    Kidney disease Mother    Stroke Mother    Hyperlipidemia Mother    Diabetes Maternal Aunt     Social History   Socioeconomic History   Marital status: Single    Spouse name: Not on file   Number of children: Not on file   Years of education: Not on file   Highest education level: Not on file  Occupational History   Not on file  Tobacco Use   Smoking status: Former    Current packs/day: 0.00    Types: Cigarettes    Quit date: 03/14/2013    Years since quitting: 10.1   Smokeless tobacco: Never   Tobacco comments:    1 cigarette1-2 times year, only when she gets stressed  Pt states she never smoked   Vaping Use   Vaping status: Never Used  Substance and Sexual Activity   Alcohol use: Not Currently    Alcohol/week: 1.0  standard drink of alcohol    Types: 1 Cans of beer per week    Comment: One can of beer every 2-3 months.    Drug use: No   Sexual activity: Not Currently  Other Topics Concern   Not on file  Social History Narrative   ** Merged History Encounter **       Social Drivers of Health   Financial Resource Strain: High Risk (02/26/2022)   Overall Financial Resource Strain (CARDIA)    Difficulty of Paying Living Expenses: Very hard  Food Insecurity: Food Insecurity Present (12/03/2022)   Hunger Vital Sign    Worried About Running Out of Food in the Last Year: Sometimes true    Ran Out of Food in  the Last Year: Never true  Transportation Needs: No Transportation Needs (12/03/2022)   PRAPARE - Administrator, Civil Service (Medical): No    Lack of Transportation (Non-Medical): No  Physical Activity: Not on file  Stress: Not on file  Social Connections: Not on file    Allergies  Allergen Reactions   Lisinopril Other (See Comments)    Patient does not wish to take this medication (she heard that it can make your kidneys worse)   Amoxicillin Rash    Outpatient Medications Prior to Visit  Medication Sig Dispense Refill   ondansetron (ZOFRAN) 4 MG tablet Take 1 tablet (4 mg total) by mouth every 8 (eight) hours as needed for nausea or vomiting. 20 tablet 0   furosemide (LASIX) 40 MG tablet Take 2 tablets (80mg ) in am and 1 tablet (40mg ) in afternoon 90 tablet 0   hydrALAZINE (APRESOLINE) 25 MG tablet Take 1 tablet (25 mg total) by mouth 3 (three) times daily. 90 tablet 0   QUEtiapine (SEROQUEL) 50 MG tablet Take 1 tablet (50 mg total) by mouth at bedtime. 30 tablet 3   fluticasone (FLONASE) 50 MCG/ACT nasal spray Place 1 spray into both nostrils daily. 16 g 0   amLODipine (NORVASC) 10 MG tablet Take 1 tablet (10 mg total) by mouth daily. 90 tablet 1   No facility-administered medications prior to visit.     ROS Review of Systems  Constitutional:  Negative for activity change and appetite change.  HENT:  Negative for sinus pressure and sore throat.   Respiratory:  Negative for chest tightness, shortness of breath and wheezing.   Cardiovascular:  Positive for leg swelling. Negative for chest pain and palpitations.  Gastrointestinal:  Negative for abdominal distention, abdominal pain and constipation.  Genitourinary: Negative.   Musculoskeletal: Negative.   Psychiatric/Behavioral:  Negative for behavioral problems and dysphoric mood.     Objective:  BP (!) 188/72   Pulse 76   Ht 5\' 2"  (1.575 m)   Wt 125 lb 12.8 oz (57.1 kg)   SpO2 99%   BMI 23.01 kg/m       05/20/2023    3:45 PM 05/20/2023    3:10 PM 05/12/2023    9:34 AM  BP/Weight  Systolic BP 188 176 164  Diastolic BP 72 72 50  Wt. (Lbs)  125.8   BMI  23.01 kg/m2       Physical Exam Constitutional:      Appearance: She is well-developed.  Cardiovascular:     Rate and Rhythm: Normal rate.     Heart sounds: Normal heart sounds. No murmur heard. Pulmonary:     Effort: Pulmonary effort is normal.     Breath sounds: Normal breath sounds. No wheezing or rales.  Chest:     Chest wall: No tenderness.  Abdominal:     General: Bowel sounds are normal. There is no distension.     Palpations: Abdomen is soft. There is no mass.     Tenderness: There is no abdominal tenderness.  Musculoskeletal:        General: Normal range of motion.     Right lower leg: Edema (1+) present.     Left lower leg: No edema.     Comments: Left arm AV graft with palpable thrill  Neurological:     Mental Status: She is alert and oriented to person, place, and time.  Psychiatric:        Mood and Affect: Mood normal.        Latest Ref Rng & Units 03/18/2023    1:33 PM 12/06/2022    3:45 AM 12/05/2022    3:42 AM  CMP  Glucose 70 - 99 mg/dL 90  295  621   BUN 8 - 23 mg/dL 62  73  70   Creatinine 0.44 - 1.00 mg/dL 3.08  6.57  8.46   Sodium 135 - 145 mmol/L 136  137  137   Potassium 3.5 - 5.1 mmol/L 4.5  4.5  4.0   Chloride 98 - 111 mmol/L 105  104  103   CO2 22 - 32 mmol/L 17  24  24    Calcium 8.9 - 10.3 mg/dL 8.6  8.6  8.7   Total Protein 6.5 - 8.1 g/dL 6.9   5.8   Total Bilirubin 0.0 - 1.2 mg/dL 0.7   0.4   Alkaline Phos 38 - 126 U/L 59   61   AST 15 - 41 U/L 27   20   ALT 0 - 44 U/L 33   32     Lipid Panel     Component Value Date/Time   CHOL 197 11/18/2020 1825   CHOL 235 (H) 01/13/2019 1507   TRIG 144 11/18/2020 1825   HDL 49 11/18/2020 1825   HDL 61 01/13/2019 1507   CHOLHDL 4.0 11/18/2020 1825   VLDL 29 11/18/2020 1825   LDLCALC 119 (H) 11/18/2020 1825   LDLCALC 158 (H) 01/13/2019 1507     CBC    Component Value Date/Time   WBC 3.1 (L) 03/18/2023 1333   RBC 3.32 (L) 03/18/2023 1333   HGB 9.8 (L) 05/12/2023 0938   HGB 10.1 (L) 10/21/2019 1531   HCT 30.9 (L) 03/18/2023 1333   HCT 30.5 (L) 10/21/2019 1531   PLT 154 03/18/2023 1333   PLT 270 10/21/2019 1531   MCV 93.1 03/18/2023 1333   MCV 89 10/21/2019 1531   MCH 29.8 03/18/2023 1333   MCHC 32.0 03/18/2023 1333   RDW 13.8 03/18/2023 1333   RDW 13.2 10/21/2019 1531   LYMPHSABS 0.4 (L) 03/18/2023 1333   LYMPHSABS 2.1 10/21/2019 1531   MONOABS 0.7 03/18/2023 1333   EOSABS 0.0 03/18/2023 1333   EOSABS 0.2 10/21/2019 1531   BASOSABS 0.0 03/18/2023 1333   BASOSABS 0.0 10/21/2019 1531    Lab Results  Component Value Date   HGBA1C 5.9 05/20/2023       Assessment & Plan Hypertension   Blood pressure remains elevated  despite adherence to current medications. Control is crucial for upcoming dental procedure.   - Increase hydralazine to 50 mg three times daily; continue amlodipine and furosemide - Recheck blood pressure.   - Follow up in one month to assess blood pressure control.  Chronic Kidney Disease Stage 5   Stage 5 CKD with AV graft but not yet commenced dialysis.  Discussed potential for disability application.   - Continue current medication regimen.   - Discuss disability application with nephrologist.    Insomnia   Takes Seroquel for racing thoughts and difficulty sleeping.  Underlying history of Schizoaffective disorder  -Lost to Behavioral health follow-up   Type 2 diabetes mellitus - A1c is 5.9 - Anticipate that this is lower than in reality due to her CKD stage V - Diet control   Tooth ache   Plans to schedule a dental appointment for tooth extraction. Needs blood pressure control for the procedure.   - Schedule dental appointment with Dr. Hoover Browns.   - Ensure blood pressure is controlled before dental procedure.      Meds ordered this encounter  Medications    hydrALAZINE (APRESOLINE) 50 MG tablet    Sig: Take 1 tablet (50 mg total) by mouth 3 (three) times daily.    Dispense:  270 tablet    Refill:  1    Dose increase   amLODipine (NORVASC) 10 MG tablet    Sig: Take 1 tablet (10 mg total) by mouth daily.    Dispense:  90 tablet    Refill:  1   furosemide (LASIX) 40 MG tablet    Sig: Take 2 tablets (80 mg total) by mouth in the morning AND 1 tablet (40 mg total) daily in the afternoon.    Dispense:  90 tablet    Refill:  5   QUEtiapine (SEROQUEL) 50 MG tablet    Sig: Take 1 tablet (50 mg total) by mouth at bedtime.    Dispense:  90 tablet    Refill:  1    Follow-up: Return in about 1 month (around 06/19/2023) for Blood Pressure follow-up with PCP, ok to double book in the morning.       Hoy Register, MD, FAAFP. [facility] [place], [place] [phone]   05/20/2023, 4:11 PM

## 2023-05-20 NOTE — Patient Instructions (Signed)
 VISIT SUMMARY:  During today's visit, we discussed your elevated blood pressure, chronic kidney disease, insomnia, and preparation for your upcoming dental procedure. We also talked about your interest in applying for disability due to your chronic kidney disease.  YOUR PLAN:  -HYPERTENSION: Your blood pressure is still high at 161 mmHg despite taking your medications. High blood pressure can lead to serious health issues if not controlled. We have increased your hydralazine dose to 50 mg three times daily and will recheck your blood pressure. Please follow up in one month to see how your blood pressure is doing.  -CHRONIC KIDNEY DISEASE STAGE 5: You have advanced kidney disease, which requires careful management of your blood pressure and other health factors. Continue taking your current medications and discuss your interest in applying for disability with your nephrologist.  -INSOMNIA: You are experiencing difficulty sleeping and racing thoughts, for which you take Seroquel. Insomnia can affect your overall health and well-being. We are considering a referral to behavioral health for a psychiatric evaluation to better manage your symptoms.  -GENERAL HEALTH MAINTENANCE: You are doing well with managing your salt intake by rinsing canned goods and not adding extra salt to your meals. Continue these practices to help manage your blood pressure and overall health.  -DENTAL PROCEDURE PREPARATION: You need to have a tooth extracted, but this has been postponed due to your high blood pressure. It is important to control your blood pressure before the procedure. Please schedule your dental appointment with Dr. Hoover Browns and ensure your blood pressure is under control before the procedure.  INSTRUCTIONS:  Please follow up in one month to assess your blood pressure control. Additionally, schedule your dental appointment with Dr. Hoover Browns and ensure your blood pressure is controlled before the  procedure. Discuss your interest in applying for disability with your nephrologist.

## 2023-05-29 ENCOUNTER — Other Ambulatory Visit: Payer: Self-pay

## 2023-06-04 LAB — LAB REPORT - SCANNED: EGFR: 7

## 2023-06-09 ENCOUNTER — Ambulatory Visit (HOSPITAL_COMMUNITY)
Admission: RE | Admit: 2023-06-09 | Discharge: 2023-06-09 | Disposition: A | Discharge status: Home / Self Care | Payer: MEDICAID | Source: Ambulatory Visit | Attending: Internal Medicine | Admitting: Internal Medicine

## 2023-06-09 ENCOUNTER — Encounter (HOSPITAL_COMMUNITY): Payer: MEDICAID

## 2023-06-09 VITALS — BP 174/60 | HR 71 | Temp 98.2°F | Resp 17

## 2023-06-09 DIAGNOSIS — N185 Chronic kidney disease, stage 5: Secondary | ICD-10-CM | POA: Diagnosis present

## 2023-06-09 DIAGNOSIS — N184 Chronic kidney disease, stage 4 (severe): Secondary | ICD-10-CM | POA: Insufficient documentation

## 2023-06-09 DIAGNOSIS — D649 Anemia, unspecified: Secondary | ICD-10-CM | POA: Insufficient documentation

## 2023-06-09 DIAGNOSIS — N183 Chronic kidney disease, stage 3 unspecified: Secondary | ICD-10-CM | POA: Insufficient documentation

## 2023-06-09 LAB — POCT HEMOGLOBIN-HEMACUE: Hemoglobin: 9.8 g/dL — ABNORMAL LOW (ref 12.0–15.0)

## 2023-06-09 MED ORDER — EPOETIN ALFA 10000 UNIT/ML IJ SOLN: 20000.0000 [IU] | Freq: Once | INTRAMUSCULAR | Status: DC

## 2023-06-09 MED ORDER — SODIUM CHLORIDE 0.9 % IV SOLN
510.0000 mg | INTRAVENOUS | Status: DC
  Administered 2023-06-09: 510 mg via INTRAVENOUS
  Filled 2023-06-09: qty 510

## 2023-06-09 MED ORDER — EPOETIN ALFA 20000 UNIT/ML IJ SOLN
INTRAMUSCULAR | Status: AC
  Administered 2023-06-09: 20000 [IU] via SUBCUTANEOUS
  Filled 2023-06-09: qty 1

## 2023-06-23 ENCOUNTER — Encounter (HOSPITAL_COMMUNITY)
Admission: RE | Admit: 2023-06-23 | Discharge: 2023-06-23 | Disposition: A | Discharge status: Home / Self Care | Payer: MEDICAID | Source: Ambulatory Visit | Attending: Internal Medicine | Admitting: Internal Medicine

## 2023-06-23 DIAGNOSIS — N184 Chronic kidney disease, stage 4 (severe): Secondary | ICD-10-CM | POA: Insufficient documentation

## 2023-06-23 DIAGNOSIS — N186 End stage renal disease: Secondary | ICD-10-CM | POA: Diagnosis present

## 2023-06-23 DIAGNOSIS — D649 Anemia, unspecified: Secondary | ICD-10-CM | POA: Insufficient documentation

## 2023-06-23 DIAGNOSIS — N183 Chronic kidney disease, stage 3 unspecified: Secondary | ICD-10-CM | POA: Insufficient documentation

## 2023-06-23 DIAGNOSIS — N185 Chronic kidney disease, stage 5: Secondary | ICD-10-CM | POA: Insufficient documentation

## 2023-06-23 MED ORDER — SODIUM CHLORIDE 0.9 % IV SOLN
510.0000 mg | INTRAVENOUS | Status: DC
  Administered 2023-06-23: 510 mg via INTRAVENOUS
  Filled 2023-06-23: qty 510

## 2023-06-30 ENCOUNTER — Encounter: Payer: Self-pay | Admitting: Family Medicine

## 2023-06-30 ENCOUNTER — Ambulatory Visit: Payer: MEDICAID | Attending: Family Medicine | Admitting: Family Medicine

## 2023-06-30 ENCOUNTER — Ambulatory Visit: Payer: MEDICAID | Admitting: Family Medicine

## 2023-06-30 VITALS — BP 181/70 | HR 74 | Ht 62.0 in | Wt 126.0 lb

## 2023-06-30 DIAGNOSIS — J302 Other seasonal allergic rhinitis: Secondary | ICD-10-CM

## 2023-06-30 DIAGNOSIS — E1122 Type 2 diabetes mellitus with diabetic chronic kidney disease: Secondary | ICD-10-CM | POA: Diagnosis not present

## 2023-06-30 DIAGNOSIS — I120 Hypertensive chronic kidney disease with stage 5 chronic kidney disease or end stage renal disease: Secondary | ICD-10-CM | POA: Diagnosis not present

## 2023-06-30 DIAGNOSIS — N185 Chronic kidney disease, stage 5: Secondary | ICD-10-CM

## 2023-06-30 DIAGNOSIS — R600 Localized edema: Secondary | ICD-10-CM

## 2023-06-30 MED ORDER — LEVOCETIRIZINE DIHYDROCHLORIDE 5 MG PO TABS: 5.0000 mg | ORAL_TABLET | Freq: Every evening | ORAL | 1 refills | Status: AC

## 2023-06-30 MED ORDER — OLOPATADINE HCL 0.1 % OP SOLN: 1.0000 [drp] | Freq: Two times a day (BID) | OPHTHALMIC | 1 refills | Status: AC

## 2023-06-30 NOTE — Progress Notes (Signed)
 Subjective:  Patient ID: Lorraine Turner, female    DOB: 07-26-1960  Age: 63 y.o. MRN: [id number]  CC: Medical Management of Chronic Issues (Ankle(R) Swollen)     Discussed the use of AI scribe software for clinical note transcription with the patient, who gave verbal consent to proceed.  History of Present Illness Lorraine Turner is a 63 year old female with a history of  schizoaffective affective disorder, hypertension, diabetic neuropathy, type 2 diabetes mellitus, stage V CKD insomnia  who presents with elevated blood pressure.  Her blood pressure remains elevated despite adjustments to her medication regimen. She was instructed to increase hydralazine  to 50 mg three times daily but has been taking it once daily due to a misunderstanding. She also takes amlodipine  10 mg once daily. She experiences ankle swelling, more pronounced on the right, and takes Lasix  inconsistently, stopping when swelling subsides.  She has symptoms of allergies, including sneezing, itchy eyes, and a runny nose, attributed to pollen exposure. She recalls being prescribed medication for these symptoms previously. She feels tired but has no other symptoms related to her blood pressure or medication use.    Past Medical History:  Diagnosis Date   AKI (acute kidney injury) (HCC) 09/12/2018   Stage 4   Anxiety    Bipolar 1 disorder (HCC)    Depression    Diabetes mellitus without complication (HCC)    Diarrhea 11/21/2020   Gallstones    Hyperlipidemia    Hypertension    MDD (major depressive disorder), recurrent episode, moderate (HCC) 11/18/2020   Neuropathy    Schizophrenia (HCC)    Seizures (HCC)    X1- febrile seizure as a child-none since   Suicide ideation 09/12/2018    Past Surgical History:  Procedure Laterality Date   AV FISTULA PLACEMENT Left 06/03/2022   Procedure: INSERTION OF ARTERIOVENOUS (AV) GORE-TEX GRAFT LEFT ARM;  Surgeon: Carlene Che, MD;  Location: [facility] OR;  Service:  Vascular;  Laterality: Left;   CESAREAN SECTION  05/1998   X 1   CHOLECYSTECTOMY N/A 03/27/2016   Procedure: LAPAROSCOPIC CHOLECYSTECTOMY;  Surgeon: Emmalene Hare, MD;  Location: [facility] ORS;  Service: General;  Laterality: N/A;   TONSILLECTOMY AND ADENOIDECTOMY     age 23   TUBAL LIGATION      Family History  Problem Relation Age of Onset   Diabetes Mother    Heart disease Mother    Kidney disease Mother    Stroke Mother    Hyperlipidemia Mother    Diabetes Maternal Aunt     Social History   Socioeconomic History   Marital status: Single    Spouse name: Not on file   Number of children: Not on file   Years of education: Not on file   Highest education level: Not on file  Occupational History   Not on file  Tobacco Use   Smoking status: Former    Current packs/day: 0.00    Types: Cigarettes    Quit date: 03/14/2013    Years since quitting: 10.3   Smokeless tobacco: Never   Tobacco comments:    1 cigarette1-2 times year, only when she gets stressed  Pt states she never smoked   Vaping Use   Vaping status: Never Used  Substance and Sexual Activity   Alcohol use: Not Currently    Alcohol/week: 1.0 standard drink of alcohol    Types: 1 Cans of beer per week    Comment: One can of beer every 2-3 months.  Drug use: No   Sexual activity: Not Currently  Other Topics Concern   Not on file  Social History Narrative   ** Merged History Encounter **       Social Drivers of Health   Financial Resource Strain: High Risk (02/26/2022)   Overall Financial Resource Strain (CARDIA)    Difficulty of Paying Living Expenses: Very hard  Food Insecurity: Food Insecurity Present (12/03/2022)   Hunger Vital Sign    Worried About Running Out of Food in the Last Year: Sometimes true    Ran Out of Food in the Last Year: Never true  Transportation Needs: No Transportation Needs (12/03/2022)   PRAPARE - Administrator, Civil Service (Medical): No    Lack of Transportation  (Non-Medical): No  Physical Activity: Not on file  Stress: Not on file  Social Connections: Not on file    Allergies  Allergen Reactions   Lisinopril  Other (See Comments)    Patient does not wish to take this medication (she heard that it can make your kidneys worse)   Amoxicillin Rash    Outpatient Medications Prior to Visit  Medication Sig Dispense Refill   furosemide  (LASIX ) 40 MG tablet Take 2 tablets (80 mg total) by mouth in the morning AND 1 tablet (40 mg total) daily in the afternoon. 90 tablet 5   hydrALAZINE  (APRESOLINE ) 50 MG tablet Take 1 tablet (50 mg total) by mouth 3 (three) times daily. 270 tablet 1   ondansetron  (ZOFRAN ) 4 MG tablet Take 1 tablet (4 mg total) by mouth every 8 (eight) hours as needed for nausea or vomiting. 20 tablet 0   QUEtiapine  (SEROQUEL ) 50 MG tablet Take 1 tablet (50 mg total) by mouth at bedtime. 90 tablet 1   amLODipine  (NORVASC ) 10 MG tablet Take 1 tablet (10 mg total) by mouth daily. 90 tablet 1   fluticasone  (FLONASE ) 50 MCG/ACT nasal spray Place 1 spray into both nostrils daily. 16 g 0   No facility-administered medications prior to visit.     ROS Review of Systems  Constitutional:  Negative for activity change and appetite change.  HENT:  Positive for rhinorrhea. Negative for sinus pressure and sore throat.   Eyes:  Positive for itching.  Respiratory:  Negative for chest tightness, shortness of breath and wheezing.   Cardiovascular:  Positive for leg swelling. Negative for chest pain and palpitations.  Gastrointestinal:  Negative for abdominal distention, abdominal pain and constipation.  Genitourinary: Negative.   Musculoskeletal: Negative.   Psychiatric/Behavioral:  Negative for behavioral problems and dysphoric mood.     Objective:  BP (!) 181/70   Pulse 74   Ht 5\' 2"  (1.575 m)   Wt 126 lb (57.2 kg)   SpO2 97%   BMI 23.05 kg/m      06/30/2023    2:53 PM 06/30/2023    2:33 PM 06/23/2023   12:57 PM  BP/Weight  Systolic  BP 181 096 160  Diastolic BP 70 62 51  Wt. (Lbs)  126   BMI  23.05 kg/m2       Physical Exam Constitutional:      Appearance: She is well-developed.  HENT:     Right Ear: Tympanic membrane normal.     Left Ear: Tympanic membrane normal.     Mouth/Throat:     Mouth: Mucous membranes are moist.  Cardiovascular:     Rate and Rhythm: Normal rate.     Heart sounds: Normal heart sounds. No murmur heard. Pulmonary:  Effort: Pulmonary effort is normal.     Breath sounds: Normal breath sounds. No wheezing or rales.  Chest:     Chest wall: No tenderness.  Abdominal:     General: Bowel sounds are normal. There is no distension.     Palpations: Abdomen is soft. There is no mass.     Tenderness: There is no abdominal tenderness.  Musculoskeletal:        General: Normal range of motion.     Right lower leg: Edema (1+ pitting) present.     Left lower leg: No edema.  Neurological:     Mental Status: She is alert and oriented to person, place, and time.  Psychiatric:        Mood and Affect: Mood normal.        Latest Ref Rng & Units 03/18/2023    1:33 PM 12/06/2022    3:45 AM 12/05/2022    3:42 AM  CMP  Glucose 70 - 99 mg/dL 90  045  409   BUN 8 - 23 mg/dL 62  73  70   Creatinine 0.44 - 1.00 mg/dL 8.11  9.14  7.82   Sodium 135 - 145 mmol/L 136  137  137   Potassium 3.5 - 5.1 mmol/L 4.5  4.5  4.0   Chloride 98 - 111 mmol/L 105  104  103   CO2 22 - 32 mmol/L 17  24  24    Calcium  8.9 - 10.3 mg/dL 8.6  8.6  8.7   Total Protein 6.5 - 8.1 g/dL 6.9   5.8   Total Bilirubin 0.0 - 1.2 mg/dL 0.7   0.4   Alkaline Phos 38 - 126 U/L 59   61   AST 15 - 41 U/L 27   20   ALT 0 - 44 U/L 33   32     Lipid Panel     Component Value Date/Time   CHOL 197 11/18/2020 1825   CHOL 235 (H) 01/13/2019 1507   TRIG 144 11/18/2020 1825   HDL 49 11/18/2020 1825   HDL 61 01/13/2019 1507   CHOLHDL 4.0 11/18/2020 1825   VLDL 29 11/18/2020 1825   LDLCALC 119 (H) 11/18/2020 1825   LDLCALC 158 (H)  01/13/2019 1507    CBC    Component Value Date/Time   WBC 3.1 (L) 03/18/2023 1333   RBC 3.32 (L) 03/18/2023 1333   HGB 9.8 (L) 06/09/2023 1100   HGB 10.1 (L) 10/21/2019 1531   HCT 30.9 (L) 03/18/2023 1333   HCT 30.5 (L) 10/21/2019 1531   PLT 154 03/18/2023 1333   PLT 270 10/21/2019 1531   MCV 93.1 03/18/2023 1333   MCV 89 10/21/2019 1531   MCH 29.8 03/18/2023 1333   MCHC 32.0 03/18/2023 1333   RDW 13.8 03/18/2023 1333   RDW 13.2 10/21/2019 1531   LYMPHSABS 0.4 (L) 03/18/2023 1333   LYMPHSABS 2.1 10/21/2019 1531   MONOABS 0.7 03/18/2023 1333   EOSABS 0.0 03/18/2023 1333   EOSABS 0.2 10/21/2019 1531   BASOSABS 0.0 03/18/2023 1333   BASOSABS 0.0 10/21/2019 1531    Lab Results  Component Value Date   HGBA1C 5.9 05/20/2023      1. Hypertension associated with stage 5 chronic kidney disease due to type 2 diabetes mellitus (HCC) (Primary) Uncontrolled due to nonadherence with prescribed regimen Reemphasized the need to take hydralazine  50 mg 3 times daily Continue amlodipine  10 mg daily Adhere with furosemide  as that would also help with blood pressure  Counseled on blood pressure goal of less than 130/80, low-sodium, DASH diet, medication compliance, 150 minutes of moderate intensity exercise per week. Discussed medication compliance, adverse effects.   2. Seasonal allergies Placed on Xyzal  and Patanol eyedrops       Meds ordered this encounter  Medications   levocetirizine (XYZAL ) 5 MG tablet    Sig: Take 1 tablet (5 mg total) by mouth every evening.    Dispense:  90 tablet    Refill:  1   olopatadine  (PATANOL) 0.1 % ophthalmic solution    Sig: Place 1 drop into both eyes 2 (two) times daily.    Dispense:  5 mL    Refill:  1    Follow-up: Return in about 3 months (around 09/30/2023) for Chronic medical conditions.       Joaquin Mulberry, MD, FAAFP. [facility] [place], [place] [phone]   06/30/2023, 4:15 PM

## 2023-06-30 NOTE — Patient Instructions (Signed)
 VISIT SUMMARY:  During your visit, we discussed your elevated blood pressure and allergy symptoms. We reviewed your current medications and made some adjustments to help better manage your conditions.  YOUR PLAN:  -HYPERTENSION: Hypertension means high blood pressure. Your blood pressure remains high because of a misunderstanding with your medication. You should now take hydralazine  50 mg three times daily and continue with amlodipine  10 mg once daily. Consistent use of Lasix  will help control both your blood pressure and ankle swelling.  -ALLERGIC RHINITIS: Allergic rhinitis is an allergic reaction that causes sneezing, itchy eyes, and a runny nose. Your symptoms are likely due to pollen exposure. We have prescribed Xyzal  (levocetirizine) to take once daily and eye drops for your itchy eyes. Your prescriptions have been sent to [facility], [employer].  INSTRUCTIONS:  Please follow up as instructed to monitor your blood pressure and symptoms. If you have any questions or concerns, do not hesitate to contact our office.

## 2023-07-08 ENCOUNTER — Encounter (HOSPITAL_COMMUNITY)
Admission: RE | Admit: 2023-07-08 | Discharge: 2023-07-08 | Disposition: A | Discharge status: Home / Self Care | Payer: MEDICAID | Source: Ambulatory Visit | Attending: Internal Medicine | Admitting: Internal Medicine

## 2023-07-08 VITALS — BP 172/58 | HR 96 | Temp 97.4°F | Resp 17

## 2023-07-08 DIAGNOSIS — N184 Chronic kidney disease, stage 4 (severe): Secondary | ICD-10-CM | POA: Diagnosis not present

## 2023-07-08 DIAGNOSIS — N183 Chronic kidney disease, stage 3 unspecified: Secondary | ICD-10-CM

## 2023-07-08 DIAGNOSIS — D649 Anemia, unspecified: Secondary | ICD-10-CM

## 2023-07-08 DIAGNOSIS — N185 Chronic kidney disease, stage 5: Secondary | ICD-10-CM

## 2023-07-08 DIAGNOSIS — N186 End stage renal disease: Secondary | ICD-10-CM

## 2023-07-08 LAB — RENAL FUNCTION PANEL
Albumin: 4.1 g/dL (ref 3.5–5.0)
Anion gap: 12 (ref 5–15)
BUN: 77 mg/dL — ABNORMAL HIGH (ref 8–23)
CO2: 18 mmol/L — ABNORMAL LOW (ref 22–32)
Calcium: 8.9 mg/dL (ref 8.9–10.3)
Chloride: 106 mmol/L (ref 98–111)
Creatinine, Ser: 6.54 mg/dL — ABNORMAL HIGH (ref 0.44–1.00)
GFR, Estimated: 7 mL/min — ABNORMAL LOW (ref >60)
Glucose, Bld: 113 mg/dL — ABNORMAL HIGH (ref 70–99)
Phosphorus: 5.4 mg/dL — ABNORMAL HIGH (ref 2.5–4.6)
Potassium: 3.9 mmol/L (ref 3.5–5.1)
Sodium: 136 mmol/L (ref 135–145)

## 2023-07-08 LAB — IRON AND TIBC
Iron: 73 ug/dL (ref 28–170)
Saturation Ratios: 26 % (ref 10.4–31.8)
TIBC: 286 ug/dL (ref 250–450)
UIBC: 213 ug/dL

## 2023-07-08 LAB — POCT HEMOGLOBIN-HEMACUE: Hemoglobin: 10.1 g/dL — ABNORMAL LOW (ref 12.0–15.0)

## 2023-07-08 LAB — FERRITIN: Ferritin: 487 ng/mL — ABNORMAL HIGH (ref 11–307)

## 2023-07-08 MED ORDER — EPOETIN ALFA-EPBX 40000 UNIT/ML IJ SOLN
30000.0000 [IU] | INTRAMUSCULAR | Status: DC
  Administered 2023-07-08: 30000 [IU] via SUBCUTANEOUS

## 2023-07-08 MED ORDER — EPOETIN ALFA-EPBX 40000 UNIT/ML IJ SOLN
INTRAMUSCULAR | Status: AC
Start: 2023-07-08 — End: ?
  Filled 2023-07-08: qty 1

## 2023-07-09 LAB — PTH, INTACT AND CALCIUM
Calcium, Total (PTH): 8.6 mg/dL — ABNORMAL LOW (ref 8.7–10.3)
PTH: 170 pg/mL — ABNORMAL HIGH (ref 15–65)

## 2023-08-05 ENCOUNTER — Encounter (HOSPITAL_COMMUNITY): Payer: MEDICAID

## 2023-08-29 ENCOUNTER — Ambulatory Visit (HOSPITAL_COMMUNITY)
Admission: RE | Admit: 2023-08-29 | Discharge: 2023-08-29 | Disposition: A | Discharge status: Home / Self Care | Payer: MEDICAID | Source: Ambulatory Visit | Attending: Internal Medicine | Admitting: Internal Medicine

## 2023-08-29 VITALS — BP 173/56 | HR 81 | Temp 97.1°F | Resp 16

## 2023-08-29 DIAGNOSIS — D649 Anemia, unspecified: Secondary | ICD-10-CM | POA: Diagnosis present

## 2023-08-29 DIAGNOSIS — N185 Chronic kidney disease, stage 5: Secondary | ICD-10-CM | POA: Insufficient documentation

## 2023-08-29 DIAGNOSIS — N184 Chronic kidney disease, stage 4 (severe): Secondary | ICD-10-CM | POA: Insufficient documentation

## 2023-08-29 DIAGNOSIS — N183 Chronic kidney disease, stage 3 unspecified: Secondary | ICD-10-CM | POA: Insufficient documentation

## 2023-08-29 DIAGNOSIS — D631 Anemia in chronic kidney disease: Secondary | ICD-10-CM | POA: Insufficient documentation

## 2023-08-29 LAB — RENAL FUNCTION PANEL
Albumin: 3.9 g/dL (ref 3.5–5.0)
Anion gap: 12 (ref 5–15)
BUN: 66 mg/dL — ABNORMAL HIGH (ref 8–23)
CO2: 19 mmol/L — ABNORMAL LOW (ref 22–32)
Calcium: 9.1 mg/dL (ref 8.9–10.3)
Chloride: 108 mmol/L (ref 98–111)
Creatinine, Ser: 6.23 mg/dL — ABNORMAL HIGH (ref 0.44–1.00)
GFR, Estimated: 7 mL/min — ABNORMAL LOW (ref >60)
Glucose, Bld: 141 mg/dL — ABNORMAL HIGH (ref 70–99)
Phosphorus: 5.2 mg/dL — ABNORMAL HIGH (ref 2.5–4.6)
Potassium: 4.1 mmol/L (ref 3.5–5.1)
Sodium: 139 mmol/L (ref 135–145)

## 2023-08-29 LAB — IRON AND TIBC
Iron: 82 ug/dL (ref 28–170)
Saturation Ratios: 28 % (ref 10.4–31.8)
TIBC: 290 ug/dL (ref 250–450)
UIBC: 208 ug/dL

## 2023-08-29 LAB — FERRITIN: Ferritin: 370 ng/mL — ABNORMAL HIGH (ref 11–307)

## 2023-08-29 LAB — POCT HEMOGLOBIN-HEMACUE: Hemoglobin: 10.6 g/dL — ABNORMAL LOW (ref 12.0–15.0)

## 2023-08-29 MED ORDER — EPOETIN ALFA-EPBX 40000 UNIT/ML IJ SOLN
INTRAMUSCULAR | Status: AC
  Filled 2023-08-29: qty 1

## 2023-08-29 MED ORDER — EPOETIN ALFA-EPBX 40000 UNIT/ML IJ SOLN
30000.0000 [IU] | INTRAMUSCULAR | Status: DC
  Administered 2023-08-29: 30000 [IU] via SUBCUTANEOUS

## 2023-09-01 LAB — PTH, INTACT AND CALCIUM
Calcium, Total (PTH): 8.8 mg/dL (ref 8.7–10.3)
PTH: 103 pg/mL — ABNORMAL HIGH (ref 15–65)

## 2023-09-02 ENCOUNTER — Encounter (HOSPITAL_COMMUNITY): Payer: MEDICAID

## 2023-09-26 ENCOUNTER — Ambulatory Visit (HOSPITAL_COMMUNITY)
Admission: RE | Admit: 2023-09-26 | Discharge: 2023-09-26 | Disposition: A | Discharge status: Home / Self Care | Payer: MEDICAID | Source: Ambulatory Visit | Attending: Internal Medicine | Admitting: Internal Medicine

## 2023-09-26 VITALS — BP 180/60 | HR 71 | Temp 97.2°F | Resp 16 | Ht 74.0 in | Wt 188.4 lb

## 2023-09-26 DIAGNOSIS — D631 Anemia in chronic kidney disease: Secondary | ICD-10-CM | POA: Diagnosis present

## 2023-09-26 DIAGNOSIS — N185 Chronic kidney disease, stage 5: Secondary | ICD-10-CM | POA: Insufficient documentation

## 2023-09-26 DIAGNOSIS — N184 Chronic kidney disease, stage 4 (severe): Secondary | ICD-10-CM

## 2023-09-26 DIAGNOSIS — N183 Chronic kidney disease, stage 3 unspecified: Secondary | ICD-10-CM

## 2023-09-26 DIAGNOSIS — D649 Anemia, unspecified: Secondary | ICD-10-CM

## 2023-09-26 LAB — IRON AND TIBC
Iron: 90 ug/dL (ref 28–170)
Saturation Ratios: 33 % — ABNORMAL HIGH (ref 10.4–31.8)
TIBC: 270 ug/dL (ref 250–450)
UIBC: 180 ug/dL

## 2023-09-26 LAB — RENAL FUNCTION PANEL
Albumin: 4 g/dL (ref 3.5–5.0)
Anion gap: 11 (ref 5–15)
BUN: 70 mg/dL — ABNORMAL HIGH (ref 8–23)
CO2: 18 mmol/L — ABNORMAL LOW (ref 22–32)
Calcium: 9 mg/dL (ref 8.9–10.3)
Chloride: 108 mmol/L (ref 98–111)
Creatinine, Ser: 6.05 mg/dL — ABNORMAL HIGH (ref 0.44–1.00)
GFR, Estimated: 7 mL/min — ABNORMAL LOW (ref >60)
Glucose, Bld: 161 mg/dL — ABNORMAL HIGH (ref 70–99)
Phosphorus: 5.3 mg/dL — ABNORMAL HIGH (ref 2.5–4.6)
Potassium: 4 mmol/L (ref 3.5–5.1)
Sodium: 137 mmol/L (ref 135–145)

## 2023-09-26 LAB — POCT HEMOGLOBIN-HEMACUE: Hemoglobin: 11 g/dL — ABNORMAL LOW (ref 12.0–15.0)

## 2023-09-26 LAB — FERRITIN: Ferritin: 424 ng/mL — ABNORMAL HIGH (ref 11–307)

## 2023-09-26 MED ORDER — EPOETIN ALFA-EPBX 10000 UNIT/ML IJ SOLN
20000.0000 [IU] | INTRAMUSCULAR | Status: DC
  Administered 2023-09-26: 20000 [IU] via SUBCUTANEOUS

## 2023-09-26 MED ORDER — EPOETIN ALFA-EPBX 10000 UNIT/ML IJ SOLN
INTRAMUSCULAR | Status: AC
Start: 2023-09-26 — End: 2023-09-26
  Filled 2023-09-26: qty 2

## 2023-09-28 LAB — PTH, INTACT AND CALCIUM
Calcium, Total (PTH): 8.9 mg/dL (ref 8.7–10.3)
PTH: 104 pg/mL — ABNORMAL HIGH (ref 15–65)

## 2023-10-17 ENCOUNTER — Telehealth: Payer: Self-pay | Admitting: Pharmacy Technician

## 2023-10-17 NOTE — Telephone Encounter (Signed)
 Auth Submission: NO AUTH NEEDED Site of care: Site of care: [facility] Payer: [employer] Medication & CPT/J Code(s) submitted: RETACRIT  Q5106 Diagnosis Code: N18.4 Route of submission (phone, fax, portal):  Phone # Fax # Auth type: Buy/Bill HB Units/visits requested: 30,000 U Q28D Reference number:  Approval from: 07/30/23 to 02/11/24

## 2023-10-21 DIAGNOSIS — D631 Anemia in chronic kidney disease: Secondary | ICD-10-CM | POA: Insufficient documentation

## 2023-10-24 ENCOUNTER — Ambulatory Visit (HOSPITAL_COMMUNITY)
Admission: RE | Admit: 2023-10-24 | Discharge: 2023-10-24 | Disposition: A | Discharge status: Home / Self Care | Payer: MEDICAID | Source: Ambulatory Visit | Attending: Internal Medicine | Admitting: Internal Medicine

## 2023-10-24 VITALS — BP 171/59 | HR 71 | Temp 97.4°F | Resp 16

## 2023-10-24 DIAGNOSIS — D631 Anemia in chronic kidney disease: Secondary | ICD-10-CM | POA: Insufficient documentation

## 2023-10-24 DIAGNOSIS — N185 Chronic kidney disease, stage 5: Secondary | ICD-10-CM | POA: Insufficient documentation

## 2023-10-24 LAB — IRON AND TIBC
Iron: 68 ug/dL (ref 28–170)
Saturation Ratios: 26 % (ref 10.4–31.8)
TIBC: 258 ug/dL (ref 250–450)
UIBC: 190 ug/dL

## 2023-10-24 LAB — RENAL FUNCTION PANEL
Albumin: 3.9 g/dL (ref 3.5–5.0)
Anion gap: 12 (ref 5–15)
BUN: 57 mg/dL — ABNORMAL HIGH (ref 8–23)
CO2: 19 mmol/L — ABNORMAL LOW (ref 22–32)
Calcium: 8.9 mg/dL (ref 8.9–10.3)
Chloride: 110 mmol/L (ref 98–111)
Creatinine, Ser: 5.8 mg/dL — ABNORMAL HIGH (ref 0.44–1.00)
GFR, Estimated: 8 mL/min — ABNORMAL LOW (ref >60)
Glucose, Bld: 118 mg/dL — ABNORMAL HIGH (ref 70–99)
Phosphorus: 4.2 mg/dL (ref 2.5–4.6)
Potassium: 4.1 mmol/L (ref 3.5–5.1)
Sodium: 141 mmol/L (ref 135–145)

## 2023-10-24 LAB — FERRITIN: Ferritin: 338 ng/mL — ABNORMAL HIGH (ref 11–307)

## 2023-10-24 LAB — POCT HEMOGLOBIN-HEMACUE: Hemoglobin: 10.1 g/dL — ABNORMAL LOW (ref 12.0–15.0)

## 2023-10-24 MED ORDER — EPOETIN ALFA-EPBX 20000 UNIT/ML IJ SOLN
20000.0000 [IU] | Freq: Once | INTRAMUSCULAR | Status: AC
  Administered 2023-10-24: 20000 [IU] via SUBCUTANEOUS

## 2023-10-24 MED ORDER — EPOETIN ALFA-EPBX 20000 UNIT/ML IJ SOLN
INTRAMUSCULAR | Status: AC
  Filled 2023-10-24: qty 1

## 2023-10-26 LAB — PTH, INTACT AND CALCIUM
Calcium, Total (PTH): 8.6 mg/dL — ABNORMAL LOW (ref 8.7–10.3)
PTH: 75 pg/mL — ABNORMAL HIGH (ref 15–65)

## 2023-11-12 ENCOUNTER — Emergency Department (HOSPITAL_COMMUNITY): Payer: MEDICAID

## 2023-11-12 ENCOUNTER — Other Ambulatory Visit: Payer: Self-pay

## 2023-11-12 ENCOUNTER — Emergency Department (HOSPITAL_COMMUNITY)
Admission: EM | Admit: 2023-11-12 | Discharge: 2023-11-13 | Disposition: A | Discharge status: Home / Self Care | Payer: MEDICAID | Attending: Student in an Organized Health Care Education/Training Program | Admitting: Student in an Organized Health Care Education/Training Program

## 2023-11-12 DIAGNOSIS — X58XXXA Exposure to other specified factors, initial encounter: Secondary | ICD-10-CM | POA: Diagnosis not present

## 2023-11-12 DIAGNOSIS — Z79899 Other long term (current) drug therapy: Secondary | ICD-10-CM | POA: Insufficient documentation

## 2023-11-12 DIAGNOSIS — N185 Chronic kidney disease, stage 5: Secondary | ICD-10-CM | POA: Diagnosis not present

## 2023-11-12 DIAGNOSIS — I10 Essential (primary) hypertension: Secondary | ICD-10-CM

## 2023-11-12 DIAGNOSIS — E1122 Type 2 diabetes mellitus with diabetic chronic kidney disease: Secondary | ICD-10-CM | POA: Insufficient documentation

## 2023-11-12 DIAGNOSIS — I132 Hypertensive heart and chronic kidney disease with heart failure and with stage 5 chronic kidney disease, or end stage renal disease: Secondary | ICD-10-CM | POA: Insufficient documentation

## 2023-11-12 DIAGNOSIS — I5030 Unspecified diastolic (congestive) heart failure: Secondary | ICD-10-CM | POA: Diagnosis not present

## 2023-11-12 DIAGNOSIS — M545 Low back pain, unspecified: Secondary | ICD-10-CM | POA: Diagnosis present

## 2023-11-12 DIAGNOSIS — S39012A Strain of muscle, fascia and tendon of lower back, initial encounter: Secondary | ICD-10-CM | POA: Diagnosis not present

## 2023-11-12 DIAGNOSIS — N189 Chronic kidney disease, unspecified: Secondary | ICD-10-CM

## 2023-11-12 LAB — URINALYSIS, ROUTINE W REFLEX MICROSCOPIC
Bilirubin Urine: NEGATIVE
Glucose, UA: NEGATIVE mg/dL
Hgb urine dipstick: NEGATIVE
Ketones, ur: NEGATIVE mg/dL
Leukocytes,Ua: NEGATIVE
Nitrite: NEGATIVE
Protein, ur: 100 mg/dL — AB
Specific Gravity, Urine: 1.011 (ref 1.005–1.030)
pH: 6 (ref 5.0–8.0)

## 2023-11-12 LAB — CBC WITH DIFFERENTIAL/PLATELET
Abs Immature Granulocytes: 0.01 K/uL (ref 0.00–0.07)
Basophils Absolute: 0.1 K/uL (ref 0.0–0.1)
Basophils Relative: 2 %
Eosinophils Absolute: 0.2 K/uL (ref 0.0–0.5)
Eosinophils Relative: 6 %
HCT: 34.5 % — ABNORMAL LOW (ref 36.0–46.0)
Hemoglobin: 11.1 g/dL — ABNORMAL LOW (ref 12.0–15.0)
Immature Granulocytes: 0 %
Lymphocytes Relative: 11 %
Lymphs Abs: 0.5 K/uL — ABNORMAL LOW (ref 0.7–4.0)
MCH: 31.3 pg (ref 26.0–34.0)
MCHC: 32.2 g/dL (ref 30.0–36.0)
MCV: 97.2 fL (ref 80.0–100.0)
Monocytes Absolute: 0.5 K/uL (ref 0.1–1.0)
Monocytes Relative: 12 %
Neutro Abs: 2.9 K/uL (ref 1.7–7.7)
Neutrophils Relative %: 69 %
Platelets: 133 K/uL — ABNORMAL LOW (ref 150–400)
RBC: 3.55 MIL/uL — ABNORMAL LOW (ref 3.87–5.11)
RDW: 14.6 % (ref 11.5–15.5)
WBC: 4.3 K/uL (ref 4.0–10.5)
nRBC: 0 % (ref 0.0–0.2)

## 2023-11-12 LAB — COMPREHENSIVE METABOLIC PANEL WITH GFR
ALT: 15 U/L (ref 0–44)
AST: 20 U/L (ref 15–41)
Albumin: 3.8 g/dL (ref 3.5–5.0)
Alkaline Phosphatase: 54 U/L (ref 38–126)
Anion gap: 13 (ref 5–15)
BUN: 63 mg/dL — ABNORMAL HIGH (ref 8–23)
CO2: 19 mmol/L — ABNORMAL LOW (ref 22–32)
Calcium: 8.7 mg/dL — ABNORMAL LOW (ref 8.9–10.3)
Chloride: 106 mmol/L (ref 98–111)
Creatinine, Ser: 6.16 mg/dL — ABNORMAL HIGH (ref 0.44–1.00)
GFR, Estimated: 7 mL/min — ABNORMAL LOW (ref >60)
Glucose, Bld: 123 mg/dL — ABNORMAL HIGH (ref 70–99)
Potassium: 4.5 mmol/L (ref 3.5–5.1)
Sodium: 138 mmol/L (ref 135–145)
Total Bilirubin: 0.7 mg/dL (ref 0.0–1.2)
Total Protein: 7 g/dL (ref 6.5–8.1)

## 2023-11-12 MED ORDER — CLONIDINE HCL 0.2 MG PO TABS
0.2000 mg | ORAL_TABLET | Freq: Once | ORAL | Status: AC
  Administered 2023-11-12: 0.2 mg via ORAL
  Filled 2023-11-12: qty 1

## 2023-11-12 MED ORDER — HYDRALAZINE HCL 20 MG/ML IJ SOLN
20.0000 mg | Freq: Once | INTRAMUSCULAR | Status: AC
  Administered 2023-11-12: 20 mg via INTRAVENOUS
  Filled 2023-11-12: qty 1

## 2023-11-12 MED ORDER — METHOCARBAMOL 500 MG PO TABS
500.0000 mg | ORAL_TABLET | Freq: Once | ORAL | Status: AC
Start: 2023-11-12 — End: 2023-11-12
  Administered 2023-11-12: 500 mg via ORAL
  Filled 2023-11-12: qty 1

## 2023-11-12 MED ORDER — ACETAMINOPHEN 325 MG PO TABS
650.0000 mg | ORAL_TABLET | Freq: Once | ORAL | Status: AC
  Administered 2023-11-12: 650 mg via ORAL
  Filled 2023-11-12: qty 2

## 2023-11-12 MED ORDER — HYDRALAZINE HCL 25 MG PO TABS
50.0000 mg | ORAL_TABLET | Freq: Once | ORAL | Status: AC
  Administered 2023-11-12: 50 mg via ORAL
  Filled 2023-11-12: qty 2

## 2023-11-12 MED ORDER — FUROSEMIDE 10 MG/ML IJ SOLN
20.0000 mg | Freq: Once | INTRAMUSCULAR | Status: AC
  Administered 2023-11-12: 20 mg via INTRAVENOUS
  Filled 2023-11-12: qty 2

## 2023-11-12 NOTE — ED Notes (Signed)
 [facility] EMS arriving with patient from home.  The patient reports lower back pain which radiates to her umbilical region which has been for the last 5 days. Pt states she has been sleeping on an an air mattress and that's the only thing she can think of that may have caused the pain. Pain is better when standing, worse when lying down.  Hx of umbilical hernia with mesh s/p 5 years. 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 Axox4, RA, ambulatory.   EMS vitals:  BP 210/70 HR 72 O2 98 on RA CBG 152 RR 18  EMS Interventions: No Ivs, meds, EKG

## 2023-11-12 NOTE — ED Provider Notes (Signed)
  [place] EMERGENCY DEPARTMENT AT [facility] Provider Note   CSN: [id number] Arrival date & time: 11/12/23  1846     Patient presents with: Back Pain   Lorraine Turner is a 63 y.o. female.  {Add pertinent medical, surgical, social history, OB history to HPI:32947}  Back Pain      Prior to Admission medications   Medication Sig Start Date End Date Taking? Authorizing Provider  amLODipine  (NORVASC ) 10 MG tablet Take 1 tablet (10 mg total) by mouth daily. 05/20/23 06/19/23  Newlin, Enobong, MD  fluticasone  (FLONASE ) 50 MCG/ACT nasal spray Place 1 spray into both nostrils daily. 10/16/21 12/03/22  Lou Claretta CHRISTELLA, MD  furosemide  (LASIX ) 40 MG tablet Take 2 tablets (80 mg total) by mouth in the morning AND 1 tablet (40 mg total) daily in the afternoon. 05/20/23   Newlin, Enobong, MD  hydrALAZINE  (APRESOLINE ) 50 MG tablet Take 1 tablet (50 mg total) by mouth 3 (three) times daily. 05/20/23   Newlin, Enobong, MD  levocetirizine (XYZAL ) 5 MG tablet Take 1 tablet (5 mg total) by mouth every evening. 06/30/23   Newlin, Enobong, MD  olopatadine  (PATANOL) 0.1 % ophthalmic solution Place 1 drop into both eyes 2 (two) times daily. 06/30/23   Newlin, Enobong, MD  ondansetron  (ZOFRAN ) 4 MG tablet Take 1 tablet (4 mg total) by mouth every 8 (eight) hours as needed for nausea or vomiting. 12/06/22   Fairy Frames, MD  QUEtiapine  (SEROQUEL ) 50 MG tablet Take 1 tablet (50 mg total) by mouth at bedtime. 05/20/23   Newlin, Enobong, MD    Allergies: Lisinopril  and Amoxicillin    Review of Systems  Musculoskeletal:  Positive for back pain.    Updated Vital Signs BP (!) 202/66   Pulse 65   Temp 98.2 F (36.8 C) (Oral)   Resp 18   Ht 5' 2 (1.575 m)   Wt 56.7 kg   SpO2 99%   BMI 22.86 kg/m   Physical Exam  (all labs ordered are listed, but only abnormal results are displayed) Labs Reviewed - No data to display  EKG: None  Radiology: No results found.  {Document cardiac  monitor, telemetry assessment procedure when appropriate:32947} Procedures   Medications Ordered in the ED - No data to display    {Click here for ABCD2, HEART and other calculators REFRESH Note before signing:1}                              Medical Decision Making Amount and/or Complexity of Data Reviewed Labs: ordered. Radiology: ordered.   ***  {Document critical care time when appropriate  Document review of labs and clinical decision tools ie CHADS2VASC2, etc  Document your independent review of radiology images and any outside records  Document your discussion with family members, caretakers and with consultants  Document social determinants of health affecting pt's care  Document your decision making why or why not admission, treatments were needed:32947:::1}   Final diagnoses:  None    ED Discharge Orders     None

## 2023-11-12 NOTE — Discharge Instructions (Signed)
 You were seen in the emergency department for your low back pain.  We recommend you avoid sleeping on your mattresses since it exacerbates your symptoms.  Continue to closely follow-up with your specialists.  Your kidney function continues to be very poor and you need to have this closely monitored.  Your blood pressure is uncontrolled and you need to continue taking your blood pressure medications as prescribed.  Closely monitor your blood pressure and return if it is not controlled by your home medications.

## 2023-11-21 ENCOUNTER — Inpatient Hospital Stay (HOSPITAL_COMMUNITY): Admission: RE | Admit: 2023-11-21 | Payer: MEDICAID | Source: Ambulatory Visit

## 2023-12-19 ENCOUNTER — Inpatient Hospital Stay (HOSPITAL_COMMUNITY): Admission: RE | Admit: 2023-12-19 | Payer: MEDICAID | Source: Ambulatory Visit

## 2023-12-29 ENCOUNTER — Other Ambulatory Visit (HOSPITAL_COMMUNITY): Payer: Self-pay | Admitting: Internal Medicine

## 2024-01-01 ENCOUNTER — Encounter (HOSPITAL_COMMUNITY): Payer: MEDICAID

## 2024-01-29 ENCOUNTER — Encounter (HOSPITAL_COMMUNITY): Payer: MEDICAID

## 2024-02-02 ENCOUNTER — Ambulatory Visit (HOSPITAL_COMMUNITY)
Admission: RE | Admit: 2024-02-02 | Discharge: 2024-02-02 | Disposition: A | Discharge status: Home / Self Care | Payer: MEDICAID | Source: Ambulatory Visit | Attending: Internal Medicine | Admitting: Internal Medicine

## 2024-02-02 VITALS — BP 214/54 | HR 83 | Temp 97.4°F | Resp 17

## 2024-02-02 DIAGNOSIS — D631 Anemia in chronic kidney disease: Secondary | ICD-10-CM | POA: Insufficient documentation

## 2024-02-02 DIAGNOSIS — N185 Chronic kidney disease, stage 5: Secondary | ICD-10-CM | POA: Insufficient documentation

## 2024-02-02 MED ORDER — CLONIDINE HCL 0.1 MG PO TABS
0.1000 mg | ORAL_TABLET | ORAL | Status: DC | PRN
  Administered 2024-02-02: 0.1 mg via ORAL

## 2024-02-02 MED ORDER — CLONIDINE HCL 0.1 MG PO TABS
ORAL_TABLET | ORAL | Status: AC
  Filled 2024-02-02: qty 1

## 2024-02-02 MED ORDER — EPOETIN ALFA-EPBX 20000 UNIT/ML IJ SOLN: 20000.0000 [IU] | Freq: Once | INTRAMUSCULAR | Status: DC

## 2024-02-02 MED ORDER — EPOETIN ALFA-EPBX 20000 UNIT/ML IJ SOLN
INTRAMUSCULAR | Status: AC
  Filled 2024-02-02: qty 1

## 2024-02-02 NOTE — Progress Notes (Signed)
 Notified Felecia at [facility] of BP 214/54 still 30 min after clonidine  being given. Patient stated she is out of amlodipine  but can pick up tomorrow (she took hydralazine  this am). Per order by Dr Melia we should reschedule patient and ask patient to keep check  on BP as well as take BP meds.

## 2024-02-09 ENCOUNTER — Ambulatory Visit (HOSPITAL_COMMUNITY)
Admission: RE | Admit: 2024-02-09 | Discharge: 2024-02-09 | Disposition: A | Discharge status: Home / Self Care | Payer: MEDICAID | Source: Ambulatory Visit | Attending: Internal Medicine | Admitting: Internal Medicine

## 2024-02-09 VITALS — BP 156/64 | HR 72 | Temp 97.8°F | Resp 16

## 2024-02-09 DIAGNOSIS — D631 Anemia in chronic kidney disease: Secondary | ICD-10-CM | POA: Diagnosis present

## 2024-02-09 DIAGNOSIS — N185 Chronic kidney disease, stage 5: Secondary | ICD-10-CM | POA: Insufficient documentation

## 2024-02-09 LAB — IRON AND TIBC
Iron: 76 ug/dL (ref 28–170)
Saturation Ratios: 25 % (ref 10.4–31.8)
TIBC: 301 ug/dL (ref 250–450)
UIBC: 225 ug/dL

## 2024-02-09 LAB — POCT HEMOGLOBIN-HEMACUE: Hemoglobin: 9.9 g/dL — ABNORMAL LOW (ref 12.0–15.0)

## 2024-02-09 LAB — RENAL FUNCTION PANEL
Albumin: 4.5 g/dL (ref 3.5–5.0)
Anion gap: 15 (ref 5–15)
BUN: 58 mg/dL — ABNORMAL HIGH (ref 8–23)
CO2: 18 mmol/L — ABNORMAL LOW (ref 22–32)
Calcium: 9.2 mg/dL (ref 8.9–10.3)
Chloride: 106 mmol/L (ref 98–111)
Creatinine, Ser: 6.08 mg/dL — ABNORMAL HIGH (ref 0.44–1.00)
GFR, Estimated: 7 mL/min — ABNORMAL LOW
Glucose, Bld: 137 mg/dL — ABNORMAL HIGH (ref 70–99)
Phosphorus: 3.9 mg/dL (ref 2.5–4.6)
Potassium: 4.5 mmol/L (ref 3.5–5.1)
Sodium: 139 mmol/L (ref 135–145)

## 2024-02-09 LAB — FERRITIN: Ferritin: 667 ng/mL — ABNORMAL HIGH (ref 11–307)

## 2024-02-09 MED ORDER — EPOETIN ALFA-EPBX 20000 UNIT/ML IJ SOLN
20000.0000 [IU] | Freq: Once | INTRAMUSCULAR | Status: AC
  Administered 2024-02-09: 20000 [IU] via SUBCUTANEOUS

## 2024-02-09 MED ORDER — EPOETIN ALFA-EPBX 20000 UNIT/ML IJ SOLN
INTRAMUSCULAR | Status: AC
  Filled 2024-02-09: qty 1

## 2024-02-10 LAB — PTH, INTACT AND CALCIUM
Calcium, Total (PTH): 9 mg/dL (ref 8.7–10.3)
PTH: 113 pg/mL — ABNORMAL HIGH (ref 15–65)

## 2024-02-20 ENCOUNTER — Telehealth (HOSPITAL_COMMUNITY): Payer: Self-pay

## 2024-02-20 NOTE — Telephone Encounter (Signed)
 Auth Submission: NO AUTH NEEDED Site of care: Site of care: [facility] Payer: [employer] Medication & CPT/J Code(s) submitted: Retacrit  Diagnosis Code: N18.5/D63.1 Route of submission (phone, fax, portal):  Phone # Fax # Auth type: Buy/Bill HB Units/visits requested: 20000 units q4weeks Reference number:  Approval from: 02/12/24 to 05/11/24

## 2024-02-24 ENCOUNTER — Other Ambulatory Visit: Payer: Self-pay

## 2024-02-24 ENCOUNTER — Emergency Department (HOSPITAL_COMMUNITY)
Admission: EM | Admit: 2024-02-24 | Discharge: 2024-02-24 | Disposition: A | Discharge status: Home / Self Care | Payer: MEDICAID | Attending: Emergency Medicine | Admitting: Emergency Medicine

## 2024-02-24 DIAGNOSIS — I129 Hypertensive chronic kidney disease with stage 1 through stage 4 chronic kidney disease, or unspecified chronic kidney disease: Secondary | ICD-10-CM | POA: Insufficient documentation

## 2024-02-24 DIAGNOSIS — N189 Chronic kidney disease, unspecified: Secondary | ICD-10-CM | POA: Diagnosis not present

## 2024-02-24 DIAGNOSIS — I10 Essential (primary) hypertension: Secondary | ICD-10-CM

## 2024-02-24 DIAGNOSIS — R197 Diarrhea, unspecified: Secondary | ICD-10-CM | POA: Diagnosis present

## 2024-02-24 LAB — CBC WITH DIFFERENTIAL/PLATELET
Abs Immature Granulocytes: 0.02 K/uL (ref 0.00–0.07)
Basophils Absolute: 0 K/uL (ref 0.0–0.1)
Basophils Relative: 1 %
Eosinophils Absolute: 0.2 K/uL (ref 0.0–0.5)
Eosinophils Relative: 4 %
HCT: 30.8 % — ABNORMAL LOW (ref 36.0–46.0)
Hemoglobin: 9.9 g/dL — ABNORMAL LOW (ref 12.0–15.0)
Immature Granulocytes: 1 %
Lymphocytes Relative: 10 %
Lymphs Abs: 0.4 K/uL — ABNORMAL LOW (ref 0.7–4.0)
MCH: 31.4 pg (ref 26.0–34.0)
MCHC: 32.1 g/dL (ref 30.0–36.0)
MCV: 97.8 fL (ref 80.0–100.0)
Monocytes Absolute: 0.6 K/uL (ref 0.1–1.0)
Monocytes Relative: 14 %
Neutro Abs: 3 K/uL (ref 1.7–7.7)
Neutrophils Relative %: 70 %
Platelets: 171 K/uL (ref 150–400)
RBC: 3.15 MIL/uL — ABNORMAL LOW (ref 3.87–5.11)
RDW: 14 % (ref 11.5–15.5)
WBC: 4.3 K/uL (ref 4.0–10.5)
nRBC: 0 % (ref 0.0–0.2)

## 2024-02-24 LAB — BASIC METABOLIC PANEL WITH GFR
Anion gap: 14 (ref 5–15)
BUN: 60 mg/dL — ABNORMAL HIGH (ref 8–23)
CO2: 19 mmol/L — ABNORMAL LOW (ref 22–32)
Calcium: 8.9 mg/dL (ref 8.9–10.3)
Chloride: 105 mmol/L (ref 98–111)
Creatinine, Ser: 6 mg/dL — ABNORMAL HIGH (ref 0.44–1.00)
GFR, Estimated: 7 mL/min — ABNORMAL LOW
Glucose, Bld: 98 mg/dL (ref 70–99)
Potassium: 4.4 mmol/L (ref 3.5–5.1)
Sodium: 138 mmol/L (ref 135–145)

## 2024-02-24 NOTE — ED Provider Triage Note (Signed)
 Emergency Medicine Provider Triage Evaluation Note  Lorraine Turner , a 64 y.o. female  was evaluated in triage.  Pt complains of diarrhea that started this morning.  She thinks it might have been something that she ate last night.  She is concerned because she has a history of CKD and she wants to make sure her kidneys are not failing..  Review of Systems  Positive: Diarrhea Negative: Abdominal pain  Physical Exam  BP (!) 222/73 (BP Location: Right Arm)   Pulse 79   Temp 98 F (36.7 C) (Oral)   Resp 20   Ht 5' 2 (1.575 m)   SpO2 100%   BMI 22.86 kg/m  Gen:   Awake, no distress   Resp:  Normal effort  MSK:   Moves extremities without difficulty  Other:    Medical Decision Making  Medically screening exam initiated at 10:43 AM.  Appropriate orders placed.  Lorraine Turner was informed that the remainder of the evaluation will be completed by another provider, this initial triage assessment does not replace that evaluation, and the importance of remaining in the ED until their evaluation is complete.     Towana Ozell BROCKS, MD 02/24/24 [phone]

## 2024-02-24 NOTE — ED Triage Notes (Signed)
 EMS stated, Ive had diarrhea for the last  hours. Pt. Has not taken her medication for the last 2 days. At 1st did not even want to give her name.

## 2024-02-24 NOTE — Discharge Instructions (Addendum)
 Please call your nephrologist or kidney doctor to talk about your ED visit today.  Please note that your blood pressure was very high.  This can also affect your kidneys.  It is important you drink plenty of water  and take your normal medications at home, including your blood pressure medicines.

## 2024-02-24 NOTE — ED Provider Notes (Signed)
 " [place] EMERGENCY DEPARTMENT AT [facility] Provider Note   CSN: [id number] Arrival date & time: 02/24/24  1025     Patient presents with: Diarrhea   Lorraine Turner is a 64 y.o. female with history of advanced renal disease presenting to ED with complaint of diarrhea.  Patient reports that she ate something bad yesterday evening and had an upset stomach in the middle of the morning and had several episodes of watery diarrhea.  This appeared have resolved with coming to the ED.  She just wanted to come get her kidneys checked out because she is chronic kidney disease.  She does see a nephrologist for this.   HPI     Prior to Admission medications  Medication Sig Start Date End Date Taking? Authorizing Provider  amLODipine  (NORVASC ) 10 MG tablet Take 1 tablet (10 mg total) by mouth daily. 05/20/23 06/19/23  Newlin, Enobong, MD  fluticasone  (FLONASE ) 50 MCG/ACT nasal spray Place 1 spray into both nostrils daily. 10/16/21 12/03/22  Lou Claretta CHRISTELLA, MD  furosemide  (LASIX ) 40 MG tablet Take 2 tablets (80 mg total) by mouth in the morning AND 1 tablet (40 mg total) daily in the afternoon. 05/20/23   Newlin, Enobong, MD  hydrALAZINE  (APRESOLINE ) 50 MG tablet Take 1 tablet (50 mg total) by mouth 3 (three) times daily. 05/20/23   Newlin, Enobong, MD  levocetirizine (XYZAL ) 5 MG tablet Take 1 tablet (5 mg total) by mouth every evening. 06/30/23   Newlin, Enobong, MD  olopatadine  (PATANOL) 0.1 % ophthalmic solution Place 1 drop into both eyes 2 (two) times daily. 06/30/23   Newlin, Enobong, MD  ondansetron  (ZOFRAN ) 4 MG tablet Take 1 tablet (4 mg total) by mouth every 8 (eight) hours as needed for nausea or vomiting. 12/06/22   Fairy Frames, MD  QUEtiapine  (SEROQUEL ) 50 MG tablet Take 1 tablet (50 mg total) by mouth at bedtime. 05/20/23   Newlin, Enobong, MD    Allergies: Lisinopril  and Amoxicillin    Review of Systems  Updated Vital Signs BP (!) 220/68 (BP Location: Right Arm)    Pulse 77   Temp 98.1 F (36.7 C)   Resp 18   Ht 5' 2 (1.575 m)   SpO2 97%   BMI 22.86 kg/m   Physical Exam Constitutional:      General: She is not in acute distress. HENT:     Head: Normocephalic and atraumatic.  Eyes:     Conjunctiva/sclera: Conjunctivae normal.     Pupils: Pupils are equal, round, and reactive to light.  Cardiovascular:     Rate and Rhythm: Normal rate and regular rhythm.  Pulmonary:     Effort: Pulmonary effort is normal. No respiratory distress.  Abdominal:     General: There is no distension.     Tenderness: There is no abdominal tenderness.  Skin:    General: Skin is warm and dry.  Neurological:     General: No focal deficit present.     Mental Status: She is alert. Mental status is at baseline.  Psychiatric:        Mood and Affect: Mood normal.        Behavior: Behavior normal.     (all labs ordered are listed, but only abnormal results are displayed) Labs Reviewed  BASIC METABOLIC PANEL WITH GFR - Abnormal; Notable for the following components:      Result Value   CO2 19 (*)    BUN 60 (*)    Creatinine, Ser 6.00 (*)  GFR, Estimated 7 (*)    All other components within normal limits  CBC WITH DIFFERENTIAL/PLATELET - Abnormal; Notable for the following components:   RBC 3.15 (*)    Hemoglobin 9.9 (*)    HCT 30.8 (*)    Lymphs Abs 0.4 (*)    All other components within normal limits    EKG: None  Radiology: No results found.   Procedures   Medications Ordered in the ED - No data to display                                  Medical Decision Making  Patient is very symptomatic in the ED with concern for transient diarrhea.  This may have been foodborne as she reports that she may have eaten something funny yesterday evening.  She is not having persistent vomiting.  She is tolerating p.o.  She is not having any further diarrhea in the 6 hours in the ED.  I doubt C. difficile.  No indication for CT imaging.  I did review her  labs personally which as significant chronic ongoing kidney disease with a creatinine of 6, GFR less than 10, but this is unchanged from her prior levels.  I do not believe she is requiring hospitalization at this time.  Given her diarrhea is improving and she can tolerate fluids, I think she is stable for discharge home.  Encouraged her to drink a lot of water  and call and follow-up with her nephrologist.  Of note, she does have chronic ongoing high blood pressure.  Patient is advised to monitor this     Final diagnoses:  Diarrhea, unspecified type  Hypertension, unspecified type    ED Discharge Orders     None          Cottie Donnice PARAS, MD 02/24/24 1617  "

## 2024-03-01 ENCOUNTER — Encounter (HOSPITAL_COMMUNITY): Payer: MEDICAID

## 2024-03-08 ENCOUNTER — Encounter (HOSPITAL_COMMUNITY): Payer: MEDICAID
